# Patient Record
Sex: Female | Born: 1940
Health system: Southern US, Community
[De-identification: ages and names within clinical notes are randomized; demographics above are authoritative.]

## PROBLEM LIST (undated history)

## (undated) DIAGNOSIS — I10 Essential (primary) hypertension: Secondary | ICD-10-CM

## (undated) DIAGNOSIS — C4490 Unspecified malignant neoplasm of skin, unspecified: Secondary | ICD-10-CM

## (undated) DIAGNOSIS — E785 Hyperlipidemia, unspecified: Secondary | ICD-10-CM

## (undated) DIAGNOSIS — I639 Cerebral infarction, unspecified: Secondary | ICD-10-CM

## (undated) DIAGNOSIS — M1990 Unspecified osteoarthritis, unspecified site: Secondary | ICD-10-CM

## (undated) DIAGNOSIS — R3915 Urgency of urination: Secondary | ICD-10-CM

## (undated) DIAGNOSIS — H269 Unspecified cataract: Secondary | ICD-10-CM

## (undated) DIAGNOSIS — R32 Unspecified urinary incontinence: Secondary | ICD-10-CM

## (undated) HISTORY — DX: Unspecified osteoarthritis, unspecified site: M19.90

## (undated) HISTORY — DX: Essential (primary) hypertension: I10

## (undated) HISTORY — PX: TONSILLECTOMY: SUR1361

## (undated) HISTORY — PX: CARPAL TUNNEL RELEASE: SHX101

## (undated) HISTORY — PX: ABDOMINAL HYSTERECTOMY: SHX81

## (undated) HISTORY — DX: Unspecified cataract: H26.9

## (undated) HISTORY — PX: OTHER SURGICAL HISTORY: SHX169

## (undated) HISTORY — PX: TUBAL LIGATION: SHX77

## (undated) HISTORY — PX: TONSILLECTOMY AND ADENOIDECTOMY: SHX28

## (undated) HISTORY — PX: EYE SURGERY: SHX253

## (undated) HISTORY — PX: JOINT REPLACEMENT: SHX530

---
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
## 2010-08-25 LAB — LIPID PANEL: LDl/HDL Ratio: 5.9

## 2010-12-12 ENCOUNTER — Encounter: Payer: Medicare Other | Attending: Family Medicine | Admitting: *Deleted

## 2010-12-12 ENCOUNTER — Encounter: Payer: Self-pay | Admitting: *Deleted

## 2010-12-12 DIAGNOSIS — E119 Type 2 diabetes mellitus without complications: Secondary | ICD-10-CM | POA: Insufficient documentation

## 2010-12-12 DIAGNOSIS — Z713 Dietary counseling and surveillance: Secondary | ICD-10-CM | POA: Insufficient documentation

## 2010-12-12 NOTE — Progress Notes (Signed)
  Medical Nutrition Therapy:  Appt start time: 0800 end time:  0900.   Assessment:  Primary concerns today: Patient was diagnosed with Type 2 Diabetes about 6 years ago and attended Diabetes Classes at that time. She has moved to [place] recently and lost her husband about 2 years ago, so with these life changes her diabetes control has decreased. She expresses good verbal understanding of physiology of diabetes, action of her medications, value of meal planning and exercise and she tests her BG 1-2 times per day both pre and post meal. Her A1c has increased to 9.3% lately and she would like an update on diabetes management tools to bring that down.   MEDICATIONS: see list. Diabetes medication is Glimepiride 1 mg BID    DIETARY INTAKE:  Usual eating pattern includes 3 meals and 0-1 snacks per day.  Everyday foods include good variety of all food groups and good intake of fiber.  Avoided foods include olives, fruit juices and sweets.    24-hr recall:  B ( AM): 1 egg and 1 slice cheese, coffee with 1/2 & 1/2  Snk ( AM): occasionally cottage cheese or an apple  L ( PM): salad or sandwich Snk ( PM): none D ( PM): lean meat with 1 cup vegetables Snk ( PM): none Beverages: coffee, water, occasional diet soda  Usual physical activity: none recently, but plans to resume swimming twice weekly as well as walking with assistance of hiking poles to relieve knee pain, or video exercises at home  Estimated energy needs: 1200 calories 135 g carbohydrates 90 g protein 33 g fat  Progress Towards Goal(s):  Modified goal(s).   Nutritional Diagnosis:  NI-5.8.4 Inconsistent carbohydrate intake As related to BG management.  As evidenced by elevated A1c of 9.3%.    Intervention:  Nutrition counseling and diabetes education provided with review of rationale for consistent carb intake and action of her Glimipiride. She is knowledgeable of symptoms of low BG, causes and treatment.  Goals:  Follow Diabetes  Meal Plan as instructed  Eat 3 meals and 2 snacks, every 3-5 hrs  Limit carbohydrate intake to 30 grams carbohydrate/meal  Limit carbohydrate intake to 15 grams carbohydrate/snack  Add lean protein foods to meals/snacks  Monitor glucose levels as instructed by your doctor  Aim for 30 mins of physical activity daily by swimming twice weekly, walking with hiking poles and video exercises  Bring food record and glucose log to your next MD visit Handouts given during visit include:  Living Well with Type 2 Diabetes  Carb Counting and Beyond and Meal Planning handouts  Monitoring/Evaluation:  Dietary intake, exercise, self monitoring of BG daily, and body weight prn.

## 2010-12-12 NOTE — Patient Instructions (Signed)
Goals:  Follow Diabetes Meal Plan as instructed  Eat 3 meals and 2 snacks, every 3-5 hrs  Limit carbohydrate intake to 30 grams carbohydrate/meal  Limit carbohydrate intake to 15 grams carbohydrate/snack  Add lean protein foods to meals/snacks  Monitor glucose levels as instructed by your doctor  Aim for 30 mins of physical activity daily by swimming twice weekly, walking with hiking poles and video exercises  Bring food record and glucose log to your next MD visit

## 2010-12-27 ENCOUNTER — Encounter: Payer: Self-pay | Admitting: *Deleted

## 2011-03-09 ENCOUNTER — Encounter: Payer: Self-pay | Admitting: *Deleted

## 2011-03-10 ENCOUNTER — Ambulatory Visit (INDEPENDENT_AMBULATORY_CARE_PROVIDER_SITE_OTHER): Payer: Medicare Other | Admitting: Family Medicine

## 2011-03-10 ENCOUNTER — Encounter: Payer: Self-pay | Admitting: Family Medicine

## 2011-03-10 DIAGNOSIS — Z888 Allergy status to other drugs, medicaments and biological substances status: Secondary | ICD-10-CM | POA: Diagnosis not present

## 2011-03-10 DIAGNOSIS — I10 Essential (primary) hypertension: Secondary | ICD-10-CM

## 2011-03-10 DIAGNOSIS — T887XXA Unspecified adverse effect of drug or medicament, initial encounter: Secondary | ICD-10-CM

## 2011-03-10 DIAGNOSIS — E785 Hyperlipidemia, unspecified: Secondary | ICD-10-CM | POA: Diagnosis not present

## 2011-03-10 DIAGNOSIS — Z0000 Encounter for general adult medical examination without abnormal findings: Secondary | ICD-10-CM | POA: Insufficient documentation

## 2011-03-10 DIAGNOSIS — E669 Obesity, unspecified: Secondary | ICD-10-CM | POA: Insufficient documentation

## 2011-03-10 DIAGNOSIS — J301 Allergic rhinitis due to pollen: Secondary | ICD-10-CM

## 2011-03-10 DIAGNOSIS — J029 Acute pharyngitis, unspecified: Secondary | ICD-10-CM

## 2011-03-10 LAB — BASIC METABOLIC PANEL
CO2: 24 mEq/L (ref 19–32)
Chloride: 107 mEq/L (ref 96–112)
Glucose, Bld: 141 mg/dL — ABNORMAL HIGH (ref 70–99)
Potassium: 4.3 mEq/L (ref 3.5–5.3)
Sodium: 140 mEq/L (ref 135–145)

## 2011-03-10 LAB — LIPID PANEL
Cholesterol: 229 mg/dL — ABNORMAL HIGH (ref 0–200)
LDL Cholesterol: 142 mg/dL — ABNORMAL HIGH (ref 0–99)
Total CHOL/HDL Ratio: 5.6 Ratio
VLDL: 46 mg/dL — ABNORMAL HIGH (ref 0–40)

## 2011-03-10 LAB — POCT GLYCOSYLATED HEMOGLOBIN (HGB A1C): Hemoglobin A1C: 7.6

## 2011-03-10 NOTE — Progress Notes (Signed)
  Subjective:    Patient ID: Wanda [name], female    DOB: August 16, 1940, 71 y.o.   MRN: [id number]  HPI  This 71 y.o. Cauc female is here for Diabetes f/u with blood sugars in the low 120s and no  hypoglycemia. She c/o mild cough thought to be related to BP med; pt had cough with Diovan  but it seems to be a little more. She says she can live with this and cough drop stops the problem.  She had 1 visit with the nutritionist and has not needed to return; she is practicing better eating habits  and will increase activity in the coming months. She also checks feet daily and has no complaints.    Review of Systems As per HPI; otherwise; noncontributory    Objective:   Physical Exam  Nursing note and vitals reviewed. Constitutional: She is oriented to person, place, and time. She appears well-developed and well-nourished. No distress.  HENT:  Head: Normocephalic and atraumatic.  Cardiovascular: Normal rate.   Pulmonary/Chest: Effort normal. No respiratory distress.  Neurological: She is alert and oriented to person, place, and time. No cranial nerve deficit.    Results for orders placed in visit on 03/10/11  POCT GLYCOSYLATED HEMOGLOBIN (HGB A1C)      Component Value Range   Hemoglobin A1C 7.6           Assessment & Plan:   1. Type II or unspecified type diabetes mellitus without mention of complication, uncontrolled  Basic metabolic panel, POCT A1C; much improved control; no medication changes  2. Medication side effect  Cough due to ACEI but pt wants to continue current med  3. HTN, goal below 130/80    4. Hyperlipidemia LDL goal < 70  Lipid panel  5.   Immunization  Status                                       Pt declines Flu vaccine and Pneumovax

## 2011-03-10 NOTE — Patient Instructions (Signed)
Hypertriglyceridemia  Diet for High blood levels of Triglycerides Most fats in food are triglycerides. Triglycerides in your blood are stored as fat in your body. High levels of triglycerides in your blood may put you at a greater risk for heart disease and stroke.  Normal triglyceride levels are less than 150 mg/dL. Borderline high levels are 150-199 mg/dl. High levels are 200 - 499 mg/dL, and very high triglyceride levels are greater than 500 mg/dL. The decision to treat high triglycerides is generally based on the level. For people with borderline or high triglyceride levels, treatment includes weight loss and exercise. Drugs are recommended for people with very high triglyceride levels. Many people who need treatment for high triglyceride levels have metabolic syndrome. This syndrome is a collection of disorders that often include: insulin resistance, high blood pressure, blood clotting problems, high cholesterol and triglycerides. TESTING PROCEDURE FOR TRIGLYCERIDES  You should not eat 4 hours before getting your triglycerides measured. The normal range of triglycerides is between 10 and 250 milligrams per deciliter (mg/dl). Some people may have extreme levels (1000 or above), but your triglyceride level may be too high if it is above 150 mg/dl, depending on what other risk factors you have for heart disease.   People with high blood triglycerides may also have high blood cholesterol levels. If you have high blood cholesterol as well as high blood triglycerides, your risk for heart disease is probably greater than if you only had high triglycerides. High blood cholesterol is one of the main risk factors for heart disease.  CHANGING YOUR DIET  Your weight can affect your blood triglyceride level. If you are more than 20% above your ideal body weight, you may be able to lower your blood triglycerides by losing weight. Eating less and exercising regularly is the best way to combat this. Fat provides  more calories than any other food. The best way to lose weight is to eat less fat. Only 30% of your total calories should come from fat. Less than 7% of your diet should come from saturated fat. A diet low in fat and saturated fat is the same as a diet to decrease blood cholesterol. By eating a diet lower in fat, you may lose weight, lower your blood cholesterol, and lower your blood triglyceride level.  Eating a diet low in fat, especially saturated fat, may also help you lower your blood triglyceride level. Ask your dietitian to help you figure how much fat you can eat based on the number of calories your caregiver has prescribed for you.  Exercise, in addition to helping with weight loss may also help lower triglyceride levels.   Alcohol can increase blood triglycerides. You may need to stop drinking alcoholic beverages.   Too much carbohydrate in your diet may also increase your blood triglycerides. Some complex carbohydrates are necessary in your diet. These may include bread, rice, potatoes, other starchy vegetables and cereals.   Reduce "simple" carbohydrates. These may include pure sugars, candy, honey, and jelly without losing other nutrients. If you have the kind of high blood triglycerides that is affected by the amount of carbohydrates in your diet, you will need to eat less sugar and less high-sugar foods. Your caregiver can help you with this.   Adding 2-4 grams of fish oil (EPA+ DHA) may also help lower triglycerides. Speak with your caregiver before adding any supplements to your regimen.  Following the Diet  Maintain your ideal weight. Your caregivers can help you with a diet. Generally,   eating less food and getting more exercise will help you lose weight. Joining a weight control group may also help. Ask your caregivers for a good weight control group in your area.  Eat low-fat foods instead of high-fat foods. This can help you lose weight too.  These foods are lower in fat. Eat MORE  of these:   Dried beans, peas, and lentils.   Egg whites.   Low-fat cottage cheese.   Fish.   Lean cuts of meat, such as round, sirloin, rump, and flank (cut extra fat off meat you fix).   Whole grain breads, cereals and pasta.   Skim and nonfat dry milk.   Low-fat yogurt.   Poultry without the skin.   Cheese made with skim or part-skim milk, such as mozzarella, parmesan, farmers', ricotta, or pot cheese.  These are higher fat foods. Eat LESS of these:   Whole milk and foods made from whole milk, such as American, blue, cheddar, monterey jack, and swiss cheese   High-fat meats, such as luncheon meats, sausages, knockwurst, bratwurst, hot dogs, ribs, corned beef, ground pork, and regular ground beef.   Fried foods.  Limit saturated fats in your diet. Substituting unsaturated fat for saturated fat may decrease your blood triglyceride level. You will need to read package labels to know which products contain saturated fats.  These foods are high in saturated fat. Eat LESS of these:   Fried pork skins.   Whole milk.   Skin and fat from poultry.   Palm oil.   Butter.   Shortening.   Cream cheese.   Bacon.   Margarines and baked goods made from listed oils.   Vegetable shortenings.   Chitterlings.   Fat from meats.   Coconut oil.   Palm kernel oil.   Lard.   Cream.   Sour cream.   Fatback.   Coffee whiteners and non-dairy creamers made with these oils.   Cheese made from whole milk.  Use unsaturated fats (both polyunsaturated and monounsaturated) moderately. Remember, even though unsaturated fats are better than saturated fats; you still want a diet low in total fat.  These foods are high in unsaturated fat:   Canola oil.   Sunflower oil.   Mayonnaise.   Almonds.   Peanuts.   Pine nuts.   Margarines made with these oils.   Safflower oil.   Olive oil.   Avocados.   Cashews.   Peanut butter.   Sunflower seeds.   Soybean oil.     Peanut oil.   Olives.   Pecans.   Walnuts.   Pumpkin seeds.  Avoid sugar and other high-sugar foods. This will decrease carbohydrates without decreasing other nutrients. Sugar in your food goes rapidly to your blood. When there is excess sugar in your blood, your liver may use it to make more triglycerides. Sugar also contains calories without other important nutrients.  Eat LESS of these:   Sugar, brown sugar, powdered sugar, jam, jelly, preserves, honey, syrup, molasses, pies, candy, cakes, cookies, frosting, pastries, colas, soft drinks, punches, fruit drinks, and regular gelatin.   Avoid alcohol. Alcohol, even more than sugar, may increase blood triglycerides. In addition, alcohol is high in calories and low in nutrients. Ask for sparkling water, or a diet soft drink instead of an alcoholic beverage.  Suggestions for planning and preparing meals   Bake, broil, grill or roast meats instead of frying.   Remove fat from meats and skin from poultry before cooking.   Add spices,   herbs, lemon juice or vinegar to vegetables instead of salt, rich sauces or gravies.   Use a non-stick skillet without fat or use no-stick sprays.   Cool and refrigerate stews and broth. Then remove the hardened fat floating on the surface before serving.   Refrigerate meat drippings and skim off fat to make low-fat gravies.   Serve more fish.   Use less butter, margarine and other high-fat spreads on bread or vegetables.   Use skim or reconstituted non-fat dry milk for cooking.   Cook with low-fat cheeses.   Substitute low-fat yogurt or cottage cheese for all or part of the sour cream in recipes for sauces, dips or congealed salads.   Use half yogurt/half mayonnaise in salad recipes.   Substitute evaporated skim milk for cream. Evaporated skim milk or reconstituted non-fat dry milk can be whipped and substituted for whipped cream in certain recipes.   Choose fresh fruits for dessert instead of  high-fat foods such as pies or cakes. Fruits are naturally low in fat.  When Dining Out   Order low-fat appetizers such as fruit or vegetable juice, pasta with vegetables or tomato sauce.   Select clear, rather than cream soups.   Ask that dressings and gravies be served on the side. Then use less of them.   Order foods that are baked, broiled, poached, steamed, stir-fried, or roasted.   Ask for margarine instead of butter, and use only a small amount.   Drink sparkling water, unsweetened tea or coffee, or diet soft drinks instead of alcohol or other sweet beverages.  QUESTIONS AND ANSWERS ABOUT OTHER FATS IN THE BLOOD: SATURATED FAT, TRANS FAT, AND CHOLESTEROL What is trans fat? Trans fat is a type of fat that is formed when vegetable oil is hardened through a process called hydrogenation. This process helps makes foods more solid, gives them shape, and prolongs their shelf life. Trans fats are also called hydrogenated or partially hydrogenated oils.  What do saturated fat, trans fat, and cholesterol in foods have to do with heart disease? Saturated fat, trans fat, and cholesterol in the diet all raise the level of LDL "bad" cholesterol in the blood. The higher the LDL cholesterol, the greater the risk for coronary heart disease (CHD). Saturated fat and trans fat raise LDL similarly.  What foods contain saturated fat, trans fat, and cholesterol? High amounts of saturated fat are found in animal products, such as fatty cuts of meat, chicken skin, and full-fat dairy products like butter, whole milk, cream, and cheese, and in tropical vegetable oils such as palm, palm kernel, and coconut oil. Trans fat is found in some of the same foods as saturated fat, such as vegetable shortening, some margarines (especially hard or stick margarine), crackers, cookies, baked goods, fried foods, salad dressings, and other processed foods made with partially hydrogenated vegetable oils. Small amounts of trans fat  also occur naturally in some animal products, such as milk products, beef, and lamb. Foods high in cholesterol include liver, other organ meats, egg yolks, shrimp, and full-fat dairy products. How can I use the new food label to make heart-healthy food choices? Check the Nutrition Facts panel of the food label. Choose foods lower in saturated fat, trans fat, and cholesterol. For saturated fat and cholesterol, you can also use the Percent Daily Value (%DV): 5% DV or less is low, and 20% DV or more is high. (There is no %DV for trans fat.) Use the Nutrition Facts panel to choose foods low in   saturated fat and cholesterol, and if the trans fat is not listed, read the ingredients and limit products that list shortening or hydrogenated or partially hydrogenated vegetable oil, which tend to be high in trans fat. POINTS TO REMEMBER: YOU NEED A LITTLE TLC (THERAPEUTIC LIFESTYLE CHANGES)  Discuss your risk for heart disease with your caregivers, and take steps to reduce risk factors.   Change your diet. Choose foods that are low in saturated fat, trans fat, and cholesterol.   Add exercise to your daily routine if it is not already being done. Participate in physical activity of moderate intensity, like brisk walking, for at least 30 minutes on most, and preferably all days of the week. No time? Break the 30 minutes into three, 10-minute segments during the day.   Stop smoking. If you do smoke, contact your caregiver to discuss ways in which they can help you quit.   Do not use street drugs.   Maintain a normal weight.   Maintain a healthy blood pressure.   Keep up with your blood work for checking the fats in your blood as directed by your caregiver.  Document Released: 10/14/2003 Document Revised: 09/07/2010 Document Reviewed: 05/11/2008 [employer] Patient Information 2012 [employer]. 

## 2011-03-15 ENCOUNTER — Encounter: Payer: Self-pay | Admitting: *Deleted

## 2011-03-15 NOTE — Progress Notes (Signed)
Quick Note:  Please call pt and advise that the following labs are abnormal... Lipids are still elevated. Need to seriously consider use of alternative medication (not a Statin) to lower cholesterol. Continue healthier diet and better nutrition to improve Diabetes control. If pt is not using Omega 3 Fish Oil, then get this OTC- 1200 mg Take 1 capsule daily and we can recheck Lipids at next visit.   Provide copy of labs to pt. ______

## 2011-03-23 ENCOUNTER — Encounter: Payer: Self-pay | Admitting: Family Medicine

## 2011-03-23 ENCOUNTER — Ambulatory Visit (INDEPENDENT_AMBULATORY_CARE_PROVIDER_SITE_OTHER): Payer: Medicare Other | Admitting: Family Medicine

## 2011-03-23 VITALS — BP 178/79 | HR 80 | Temp 97.9°F | Resp 20 | Ht 61.0 in | Wt 177.6 lb

## 2011-03-23 DIAGNOSIS — I10 Essential (primary) hypertension: Secondary | ICD-10-CM

## 2011-03-23 DIAGNOSIS — T887XXA Unspecified adverse effect of drug or medicament, initial encounter: Secondary | ICD-10-CM | POA: Diagnosis not present

## 2011-03-23 MED ORDER — VALSARTAN 40 MG PO TABS: 40.0000 mg | ORAL_TABLET | Freq: Every day | ORAL | Status: DC

## 2011-03-23 NOTE — Progress Notes (Signed)
  Subjective:    Patient ID: Sary Bogie, female    DOB: 1940/09/13, 71 y.o.   MRN: [id number]  HPI  This 71 y.o. Cauc female has HTN and was started on Lisinopril/HCTZ at previous visit. She had  side effects including cough, diarrhea and swelling of right eye which all resolved after she stopped the medication.  Home BP readings range from 128- 166/ 71-84, heart rate 73-104. She wants to resume Diovan which  she took in the past without side effects; Brand Diovan was too expensive but pt wants to resume  this med if a Generic is available.    Review of Systems As per HPI    Objective:   Physical Exam  Nursing note and vitals reviewed. Constitutional: She is oriented to person, place, and time. She appears well-developed and well-nourished. No distress.  HENT:  Head: Normocephalic and atraumatic.  Eyes: EOM are normal. No scleral icterus.  Cardiovascular: Normal rate and regular rhythm.   Pulmonary/Chest: No respiratory distress.  Musculoskeletal: She exhibits no edema.  Neurological: She is alert and oriented to person, place, and time. No cranial nerve deficit. Coordination normal.  Skin: Skin is warm and dry. No rash noted. No erythema.          Assessment & Plan:   1. HTN (hypertension)     D/C ACEI                                         Rx:  Valsartan 40 mg  1 tablet daily                                         RTC as scheduled in July  2. Medication side effect     Noted in pt record: Allergy /Intolerance to ACEIs

## 2011-06-19 ENCOUNTER — Ambulatory Visit: Payer: Medicare Other

## 2011-06-19 ENCOUNTER — Ambulatory Visit (INDEPENDENT_AMBULATORY_CARE_PROVIDER_SITE_OTHER): Payer: Medicare Other | Admitting: Internal Medicine

## 2011-06-19 VITALS — BP 175/92 | HR 90 | Temp 98.2°F | Resp 18 | Ht 61.0 in | Wt 182.2 lb

## 2011-06-19 DIAGNOSIS — R079 Chest pain, unspecified: Secondary | ICD-10-CM

## 2011-06-19 DIAGNOSIS — R0789 Other chest pain: Secondary | ICD-10-CM | POA: Diagnosis not present

## 2011-06-19 DIAGNOSIS — R05 Cough: Secondary | ICD-10-CM

## 2011-06-19 DIAGNOSIS — J40 Bronchitis, not specified as acute or chronic: Secondary | ICD-10-CM

## 2011-06-19 LAB — POCT CBC
HCT, POC: 42.6 % (ref 37.7–47.9)
Hemoglobin: 14 g/dL (ref 12.2–16.2)
Lymph, poc: 2.6 (ref 0.6–3.4)
MCH, POC: 29.9 pg (ref 27–31.2)
MCHC: 32.9 g/dL (ref 31.8–35.4)
MCV: 90.8 fL (ref 80–97)
WBC: 6.8 10*3/uL (ref 4.6–10.2)

## 2011-06-19 MED ORDER — AZITHROMYCIN 500 MG PO TABS: 500.0000 mg | ORAL_TABLET | Freq: Every day | ORAL | Status: AC

## 2011-06-19 NOTE — Progress Notes (Signed)
  Subjective:    Patient ID: Wanda [name], female    DOB: 1940-08-23, 71 y.o.   MRN: [id number]  HPI  Cough Onset Saturday 3 days ago Moderate in severity assoc heaviness in chest esp with breathing No radiation occurrence is constant for past several days No sweats No palpitations No fever Sputum white    Review of Systems  Constitutional: Positive for fatigue.  HENT: Negative.   Eyes: Negative.   Respiratory: Positive for cough and chest tightness. Negative for choking, shortness of breath and wheezing.   Cardiovascular: Positive for chest pain.  Gastrointestinal: Negative.   Genitourinary: Negative.   Musculoskeletal: Negative.   Skin: Negative.   Neurological: Negative.   Psychiatric/Behavioral: Negative.   All other systems reviewed and are negative.       Objective:   Physical Exam  Nursing note and vitals reviewed. Constitutional: She appears well-developed and well-nourished.  HENT:  Head: Normocephalic and atraumatic.  Right Ear: External ear normal.  Left Ear: External ear normal.  Nose: Nose normal.  Mouth/Throat: Oropharynx is clear and moist.  Eyes: Conjunctivae and EOM are normal. Pupils are equal, round, and reactive to light.  Neck: Normal range of motion. Neck supple.  Cardiovascular: Normal rate, regular rhythm, normal heart sounds and intact distal pulses.   Pulmonary/Chest: Effort normal. No respiratory distress.  Abdominal: Soft. Bowel sounds are normal.  Skin: Skin is warm and dry.  Psychiatric: She has a normal mood and affect. Her behavior is normal. Judgment and thought content normal.    Results for orders placed in visit on 06/19/11  POCT CBC      Component Value Range   WBC 6.8  4.6 - 10.2 (K/uL)   Lymph, poc 2.6  0.6 - 3.4    POC LYMPH PERCENT 38.8  10 - 50 (%L)   MID (cbc) 0.4  0 - 0.9    POC MID % 6.4  0 - 12 (%M)   POC Granulocyte 3.7  2 - 6.9    Granulocyte percent 54.8  37 - 80 (%G)   RBC 4.69  4.04 - 5.48 (M/uL)   Hemoglobin 14.0  12.2 - 16.2 (g/dL)   HCT, POC 21.3  08.6 - 47.9 (%)   MCV 90.8  80 - 97 (fL)   MCH, POC 29.9  27 - 31.2 (pg)   MCHC 32.9  31.8 - 35.4 (g/dL)   RDW, POC 57.8     Platelet Count, POC 286  142 - 424 (K/uL)   MPV 8.4  0 - 99.8 (fL)  normal cbc  ekg nsr hr 75 qrs normal axis normal no st changes normal ekg [facility] reading (PRIMARY) by  Dr. Glenis Smoker negive chest xray.     Assessment & Plan:  Diff dx includes pneumonia, bronchitus, congestive heart failure,angina Normal cbc normal ekg chest xray is negitive for pneumonia. Will rx for bronchitus with zithromax.

## 2011-06-19 NOTE — Patient Instructions (Signed)
zithromax as directed. Return for f/up  In one week or if you develop pain or shortness of breath or fever.

## 2011-07-14 ENCOUNTER — Ambulatory Visit: Payer: Self-pay | Admitting: Family Medicine

## 2011-07-26 DIAGNOSIS — H40059 Ocular hypertension, unspecified eye: Secondary | ICD-10-CM | POA: Diagnosis not present

## 2011-07-26 DIAGNOSIS — E119 Type 2 diabetes mellitus without complications: Secondary | ICD-10-CM | POA: Diagnosis not present

## 2011-07-26 DIAGNOSIS — H2510 Age-related nuclear cataract, unspecified eye: Secondary | ICD-10-CM | POA: Diagnosis not present

## 2011-07-28 ENCOUNTER — Ambulatory Visit (INDEPENDENT_AMBULATORY_CARE_PROVIDER_SITE_OTHER): Payer: Medicare Other | Admitting: Family Medicine

## 2011-07-28 VITALS — BP 156/80 | HR 74 | Temp 96.9°F | Resp 16 | Ht 61.0 in | Wt 183.0 lb

## 2011-07-28 DIAGNOSIS — IMO0001 Reserved for inherently not codable concepts without codable children: Secondary | ICD-10-CM

## 2011-07-28 DIAGNOSIS — I10 Essential (primary) hypertension: Secondary | ICD-10-CM | POA: Diagnosis not present

## 2011-07-28 DIAGNOSIS — E785 Hyperlipidemia, unspecified: Secondary | ICD-10-CM | POA: Diagnosis not present

## 2011-07-28 LAB — LIPID PANEL
HDL: 44 mg/dL (ref >39)
LDL Cholesterol: 173 mg/dL — ABNORMAL HIGH (ref 0–99)
Total CHOL/HDL Ratio: 6 Ratio
VLDL: 48 mg/dL — ABNORMAL HIGH (ref 0–40)

## 2011-07-28 LAB — ALT: ALT: 19 U/L (ref 0–35)

## 2011-07-28 MED ORDER — GLIMEPIRIDE 2 MG PO TABS: 2.0000 mg | ORAL_TABLET | Freq: Every day | ORAL | Status: DC

## 2011-07-28 MED ORDER — GLIMEPIRIDE 2 MG PO TABS: ORAL_TABLET | ORAL | Status: DC

## 2011-07-28 NOTE — Progress Notes (Signed)
  Subjective:    Patient ID: Wanda [name], female    DOB: 01/03/1941, 71 y.o.   MRN: [id number]  HPI  Type II DM: FSBS running high due to lack of exercise (had rib injury on April 1st and not very active   for several weeks).  Has resumed swimming and strength training 2 days a week.  FSBS: 140-210. No hypoglycemia.   HTN: Not on any med now since it was discontinued due to cough. BP reading at home are good-  120-150/70-85. She denies HA, CP, palpitations, dizziness or weakness. No cough.   Review of Systems  Constitutional: Positive for activity change. Negative for diaphoresis, fatigue and unexpected weight change.  Respiratory: Negative for cough, chest tightness and shortness of breath.   Cardiovascular: Negative.   Neurological: Negative for dizziness, syncope, weakness, light-headedness and headaches.  Psychiatric/Behavioral: Negative.        Objective:   Physical Exam  Nursing note and vitals reviewed. Constitutional: She is oriented to person, place, and time. She appears well-developed and well-nourished. No distress.  HENT:  Head: Normocephalic and atraumatic.  Eyes: Conjunctivae and EOM are normal. No scleral icterus.  Cardiovascular: Normal rate and regular rhythm.   Pulmonary/Chest: Effort normal. No respiratory distress.  Musculoskeletal: She exhibits edema.  Neurological: She is alert and oriented to person, place, and time. No cranial nerve deficit.  Skin: Skin is warm and dry.  Psychiatric: She has a normal mood and affect. Her behavior is normal.    A1C=8.1%      Assessment & Plan:   1. Type II or unspecified type diabetes mellitus without mention of complication, uncontrolled  POCT glycosylated hemoglobin (Hb A1C)- increased from 7.6% in March Increase Glimepiride 2mg  to 1 tablet bid Continue checking FSBS daily  2. Hyperlipidemia LDL goal <100  Lipid panel, ALT  3. HTN, goal below 140/80  Continue to monitor BP at home; contact office if  SBP> 150  and/or symptoms occur; pt understands and agrees with plan

## 2011-07-31 ENCOUNTER — Encounter: Payer: Self-pay | Admitting: Family Medicine

## 2011-08-01 ENCOUNTER — Other Ambulatory Visit: Payer: Self-pay | Admitting: Family Medicine

## 2011-08-01 DIAGNOSIS — E785 Hyperlipidemia, unspecified: Secondary | ICD-10-CM

## 2011-08-01 MED ORDER — GEMFIBROZIL 600 MG PO TABS: 600.0000 mg | ORAL_TABLET | Freq: Two times a day (BID) | ORAL | Status: DC

## 2011-08-01 NOTE — Progress Notes (Signed)
Quick Note:  Please call pt and advise that the following labs are abnormal... Your lipids are very high; as we discussed, you will need to take a different type of medication to lower your cholesterol and triglycerides. I am prescribing Gemfibrozil 600 mg 1 tablet twice a day before meals to lower your lipids. I would like for you to have your labs checked again in about 8-10 weeks (before your next office visit).  Contact the clinic if you have any problems with this new medication.  Copy to pt. ______

## 2011-10-27 ENCOUNTER — Ambulatory Visit: Payer: Medicare Other | Admitting: Family Medicine

## 2012-04-02 DIAGNOSIS — Z803 Family history of malignant neoplasm of breast: Secondary | ICD-10-CM | POA: Diagnosis not present

## 2012-04-02 DIAGNOSIS — Z1231 Encounter for screening mammogram for malignant neoplasm of breast: Secondary | ICD-10-CM | POA: Diagnosis not present

## 2012-04-03 ENCOUNTER — Other Ambulatory Visit: Payer: Self-pay | Admitting: Family Medicine

## 2012-04-03 ENCOUNTER — Ambulatory Visit (INDEPENDENT_AMBULATORY_CARE_PROVIDER_SITE_OTHER): Payer: Medicare Other | Admitting: Family Medicine

## 2012-04-03 ENCOUNTER — Encounter: Payer: Self-pay | Admitting: Family Medicine

## 2012-04-03 VITALS — BP 202/86 | HR 85 | Temp 98.0°F | Resp 16 | Ht 61.0 in | Wt 180.0 lb

## 2012-04-03 DIAGNOSIS — E785 Hyperlipidemia, unspecified: Secondary | ICD-10-CM | POA: Diagnosis not present

## 2012-04-03 LAB — COMPREHENSIVE METABOLIC PANEL
ALT: 19 U/L (ref 0–35)
BUN: 15 mg/dL (ref 6–23)
CO2: 19 mEq/L (ref 19–32)
Calcium: 9.7 mg/dL (ref 8.4–10.5)
Chloride: 104 mEq/L (ref 96–112)
Creat: 0.76 mg/dL (ref 0.50–1.10)
Glucose, Bld: 182 mg/dL — ABNORMAL HIGH (ref 70–99)
Total Bilirubin: 0.6 mg/dL (ref 0.3–1.2)

## 2012-04-03 LAB — LIPID PANEL
Cholesterol: 284 mg/dL — ABNORMAL HIGH (ref 0–200)
HDL: 42 mg/dL (ref >39)
Total CHOL/HDL Ratio: 6.8 Ratio
Triglycerides: 225 mg/dL — ABNORMAL HIGH (ref <150)
VLDL: 45 mg/dL — ABNORMAL HIGH (ref 0–40)

## 2012-04-03 MED ORDER — GLIMEPIRIDE 2 MG PO TABS: ORAL_TABLET | ORAL | Status: DC

## 2012-04-03 NOTE — Patient Instructions (Signed)
Glimepiride dose change- continue to take 1 tablet twice a day with main meals and 1/2 tablet at  Midday with meal. Continue to make changes in your nutrition. It is good that you are managing your stress well; this is very important for maintaining good control of Diabetes. See if you can find that book- "The Blood Sugar Solution" by Dr. Iona Hansen.

## 2012-04-03 NOTE — Progress Notes (Signed)
S:  This 72 y.o. Cauc female has Type II DM; she has been under enormous stress lately and this has caused elevated FSBS (fasting= 170). She checks FSBS 1-2 times a week; during the most stressful times, blood sugars were >300. She has several family members with chronic health conditions (brother in Hospice w/ terminal cancer and son has cancer treatment x 6 years). Her daughter who lives in [place] was having back problems due to a pinched nerve in her lower spine. Pt manages stress well (" I am not a worrier"); she says she is coping with this situation and is sleeping fairly well.  Pt has been compliant with Glimepiride and blood sugars are responding to change in diet. She has eliminated wheat products and other grains (has not tried steel-cut oatmeal). No medication side effects or hypoglycemic episodes reported.  Pt has not been taking Gemfibrizol for several weeks; she reports problems remembering to take medication. Pt plans to  Start walking more; she has some walking poles that will help take the pressure and weight off her arthritic knees.   ROS: As per HPI; negative for fatigue, diaphoresis, unexplained weight loss, CP or tightness, palpitations, SOB or cough, edema, GI problems, HA, dizziness, weakness, numbness or syncope.  O:  Filed Vitals:   04/03/12 1126  BP: 202/86                               BP re-check: 150/90  Pulse: 85  Temp: 98 F (36.7 C)  Resp: 16   GEN: In NAD; WN,WD. HENT: [place]/AT; EOMI w/ clear conj/sclerae. Oroph clear and moist. COR: RRR. No edema. Distal pulses 1-2+/=. LUNGS: Normal resp rate and effort. SKIN: W&D. MS: MAEs; no deformities. Good muscle tone. Gait- antalgic. See DM foot exam. NEURO: A&O x 3; CNs intact. Nonfocal.   Results for orders placed in visit on 04/03/12  POCT GLYCOSYLATED HEMOGLOBIN (HGB A1C)      Result Value Range   Hemoglobin A1C 9.9      A/P: Type II or unspecified type diabetes mellitus without mention of complication,  uncontrolled -  Encouraged improved nutrition and exercise.  Medication change: Glimepiride 2 mg  1 tab bid w/ main meals and 1/2 tab at midday w/ meal. Plan: Comprehensive metabolic panel  Other and unspecified hyperlipidemia - pt is not taking Gemfibrozil.   Plan: Lipid panel

## 2012-04-08 ENCOUNTER — Encounter: Payer: Self-pay | Admitting: Family Medicine

## 2012-04-09 ENCOUNTER — Telehealth: Payer: Self-pay | Admitting: Radiology

## 2012-04-09 ENCOUNTER — Encounter: Payer: Self-pay | Admitting: Family Medicine

## 2012-04-09 LAB — T3, FREE: T3, Free: 2.7 pg/mL (ref 2.3–4.2)

## 2012-04-09 NOTE — Telephone Encounter (Signed)
Order given to lab to add TSH and Free T3 to labs.

## 2012-04-09 NOTE — Telephone Encounter (Signed)
Message copied by Caffie Damme on Tue Apr 09, 2012 10:19 AM ------      Message from: Dow Adolph B      Created: Mon Apr 08, 2012  7:14 PM       Please add TSH and Free T# to lab order on this pt whose results from last week have been reviewed.            Thanks. ------

## 2012-05-14 ENCOUNTER — Encounter: Payer: Self-pay | Admitting: Family Medicine

## 2012-06-12 ENCOUNTER — Encounter: Payer: Self-pay | Admitting: Family Medicine

## 2012-06-13 ENCOUNTER — Other Ambulatory Visit: Payer: Self-pay | Admitting: Family Medicine

## 2012-06-14 ENCOUNTER — Encounter: Payer: Self-pay | Admitting: Family Medicine

## 2012-07-18 ENCOUNTER — Ambulatory Visit (INDEPENDENT_AMBULATORY_CARE_PROVIDER_SITE_OTHER): Payer: Medicare Other | Admitting: Family Medicine

## 2012-07-18 ENCOUNTER — Encounter: Payer: Self-pay | Admitting: Family Medicine

## 2012-07-18 VITALS — BP 164/88 | HR 82 | Temp 98.0°F | Resp 16 | Ht 60.5 in | Wt 178.0 lb

## 2012-07-18 DIAGNOSIS — E785 Hyperlipidemia, unspecified: Secondary | ICD-10-CM | POA: Diagnosis not present

## 2012-07-18 DIAGNOSIS — E1169 Type 2 diabetes mellitus with other specified complication: Secondary | ICD-10-CM | POA: Diagnosis not present

## 2012-07-18 DIAGNOSIS — N649 Disorder of breast, unspecified: Secondary | ICD-10-CM

## 2012-07-18 DIAGNOSIS — Z0000 Encounter for general adult medical examination without abnormal findings: Secondary | ICD-10-CM | POA: Diagnosis not present

## 2012-07-18 DIAGNOSIS — Z139 Encounter for screening, unspecified: Secondary | ICD-10-CM | POA: Diagnosis not present

## 2012-07-18 LAB — POCT URINALYSIS DIPSTICK
Bilirubin, UA: NEGATIVE
Glucose, UA: NEGATIVE
Leukocytes, UA: NEGATIVE
Nitrite, UA: NEGATIVE
Urobilinogen, UA: 0.2

## 2012-07-18 LAB — LIPID PANEL
Cholesterol: 257 mg/dL — ABNORMAL HIGH (ref 0–200)
HDL: 36 mg/dL — ABNORMAL LOW (ref >39)
Total CHOL/HDL Ratio: 7.1 Ratio
Triglycerides: 231 mg/dL — ABNORMAL HIGH (ref <150)

## 2012-07-18 LAB — BASIC METABOLIC PANEL
Chloride: 104 mEq/L (ref 96–112)
Potassium: 4.2 mEq/L (ref 3.5–5.3)
Sodium: 137 mEq/L (ref 135–145)

## 2012-07-18 LAB — POCT GLYCOSYLATED HEMOGLOBIN (HGB A1C): Hemoglobin A1C: 9

## 2012-07-18 MED ORDER — GLIMEPIRIDE 2 MG PO TABS: ORAL_TABLET | ORAL | Status: DC

## 2012-07-18 NOTE — Patient Instructions (Signed)

## 2012-07-18 NOTE — Progress Notes (Deleted)
  Subjective:    Patient ID: Wanda [name], female    DOB: 30-Mar-1940, 72 y.o.   MRN: [id number]  HPI    Review of Systems     Objective:   Physical Exam   Results for orders placed in visit on 07/18/12  POCT URINALYSIS DIPSTICK      Result Value Range   Color, UA yellow     Clarity, UA clear     Glucose, UA neg     Bilirubin, UA neg     Ketones, UA neg     Spec Grav, UA <=1.005     Blood, UA trace     pH, UA 5.5     Protein, UA neg     Urobilinogen, UA 0.2     Nitrite, UA neg     Leukocytes, UA Negative    POCT GLYCOSYLATED HEMOGLOBIN (HGB A1C)      Result Value Range   Hemoglobin A1C 9.0    IFOBT (OCCULT BLOOD)      Result Value Range   IFOBT Negative          Assessment & Plan:

## 2012-07-18 NOTE — Progress Notes (Signed)
Subjective:    Patient ID: Wanda [name], female    DOB: [birth year], 72 y.o.   MRN: [id number]  HPI  This 72 y.o. Cauc female is here for [facility] Subsequent CPE. She has HTN and Type II DM  with a significant dyslipidemia (pt intolerant of statins and Metformin). Home BP: SBP= 125-145  and DBP = 70s. FSBS= 150-250; pt denies hypoglycemia. She tried following recommendations  in a book by Dr. Iona Hansen and had increased hyperglycemia. She is now following a modified  [employer] and feels better.    Patient Active Problem List   Diagnosis Date Noted  . Type II or unspecified type diabetes mellitus without mention of complication, uncontrolled 03/10/2011  . HTN, goal below 130/80 03/10/2011  . Hyperlipidemia LDL goal < 70 03/10/2011  . Obesity (BMI 30.0-34.9) 03/10/2011  . Health care maintenance 03/10/2011    PMHx, Soc Hx and Fam Hx reviewed.    Review of Systems  Constitutional: Negative.   HENT: Negative.   Eyes: Negative.   Respiratory: Negative.   Cardiovascular: Negative.   Gastrointestinal: Negative.   Endocrine: Negative.   Genitourinary: Negative.   Musculoskeletal: Negative.   Skin: Negative.   Allergic/Immunologic: Negative.   Neurological: Negative.   Hematological: Negative.   Psychiatric/Behavioral: Negative.        HANDS Questionnaire Depression screening score= 0.       Objective:   Physical Exam  Nursing note and vitals reviewed. Constitutional: She is oriented to person, place, and time. She appears well-developed and well-nourished. No distress.  HENT:  Head: Normocephalic and atraumatic.  Right Ear: Hearing, external ear and ear canal normal. Tympanic membrane is scarred.  Left Ear: Hearing, external ear and ear canal normal. Tympanic membrane is scarred.  Nose: Nose normal. No nasal deformity or septal deviation.  Mouth/Throat: Uvula is midline, oropharynx is clear and moist and mucous membranes are normal. No oral lesions. Normal dentition.   Eyes: Conjunctivae, EOM and lids are normal. Pupils are equal, round, and reactive to light. No scleral icterus.  Fundoscopic exam:      The right eye shows no papilledema. The right eye shows red reflex.       The left eye shows no papilledema. The left eye shows red reflex.  Periodic exam performed by vision care specialist.  Neck: Normal range of motion. Neck supple. No JVD present. No thyromegaly present.  Cardiovascular: Normal rate, regular rhythm, normal heart sounds and intact distal pulses.  Exam reveals no gallop and no friction rub.   No murmur heard. Pulmonary/Chest: Effort normal and breath sounds normal. No respiratory distress. She has no wheezes. She has no rales. Right breast exhibits no inverted nipple, no mass, no nipple discharge, no skin change and no tenderness. Left breast exhibits no inverted nipple, no mass, no nipple discharge, no skin change and no tenderness. Breasts are symmetrical.  Abdominal: Soft. Normal appearance and bowel sounds are normal. She exhibits no distension, no pulsatile midline mass and no mass. There is no hepatosplenomegaly. There is no tenderness. There is no guarding and no CVA tenderness. No hernia.  Genitourinary: Rectal exam shows external hemorrhoid. Rectal exam shows no fissure, no mass, no tenderness and anal tone normal. Guaiac negative stool.  NEFG; no pelvic performed.  Musculoskeletal: Normal range of motion. She exhibits no edema and no tenderness.  Mild degenerative changes in UE digits.   Lymphadenopathy:    She has no cervical adenopathy.  Neurological: She is alert and oriented to person,  place, and time. She has normal strength and normal reflexes. She displays no atrophy. No cranial nerve deficit or sensory deficit. She exhibits normal muscle tone. Coordination and gait normal.  Skin: Skin is warm and dry. No erythema. No pallor.  L breast- 1 cm superficial hyperpigmented lesion with black and brown scaly variations. No bleeding.   Psychiatric: She has a normal mood and affect. Her behavior is normal. Judgment and thought content normal.         Assessment & Plan:  Routine general medical examination at a health care facility - Pt declines referral for CRS.  Plan: POCT urinalysis dipstick, IFOBT POC (occult bld, rslt in office)  Type II or unspecified type diabetes mellitus without mention of complication, uncontrolled - Medication change- Glimiperide 2 mg tablets  Take 1 tablet 3 times daily w/ meals.    Plan: Basic metabolic panel, POCT glycosylated hemoglobin (Hb A1C)  Dyslipidemia associated with type 2 diabetes mellitus - Plan: Lipid panel  Skin lesion of breast- Probable Seb.K. Pt prefers to monitor and will seek DERM eval if lesion changes  Meds ordered this encounter  Medications  . glimepiride (AMARYL) 2 MG tablet    Sig: Take 1 tablet three times a day before main meals.    Dispense:  90 tablet    Refill:  5

## 2012-07-19 ENCOUNTER — Encounter: Payer: Self-pay | Admitting: Family Medicine

## 2012-07-20 ENCOUNTER — Encounter: Payer: Self-pay | Admitting: Family Medicine

## 2012-07-23 ENCOUNTER — Other Ambulatory Visit: Payer: Self-pay | Admitting: Family Medicine

## 2012-07-23 MED ORDER — FENOFIBRATE 145 MG PO TABS: 145.0000 mg | ORAL_TABLET | Freq: Every day | ORAL | Status: DC

## 2012-08-12 DIAGNOSIS — H40059 Ocular hypertension, unspecified eye: Secondary | ICD-10-CM | POA: Diagnosis not present

## 2012-08-12 DIAGNOSIS — E119 Type 2 diabetes mellitus without complications: Secondary | ICD-10-CM | POA: Diagnosis not present

## 2012-08-12 DIAGNOSIS — H2510 Age-related nuclear cataract, unspecified eye: Secondary | ICD-10-CM | POA: Diagnosis not present

## 2012-08-14 ENCOUNTER — Encounter: Payer: Self-pay | Admitting: Family Medicine

## 2012-08-14 ENCOUNTER — Other Ambulatory Visit: Payer: Self-pay

## 2012-08-14 DIAGNOSIS — H2510 Age-related nuclear cataract, unspecified eye: Secondary | ICD-10-CM | POA: Insufficient documentation

## 2012-09-06 ENCOUNTER — Ambulatory Visit: Payer: Medicare Other | Admitting: Family Medicine

## 2012-10-25 ENCOUNTER — Encounter: Payer: Self-pay | Admitting: Family Medicine

## 2012-10-25 ENCOUNTER — Ambulatory Visit (INDEPENDENT_AMBULATORY_CARE_PROVIDER_SITE_OTHER): Payer: Medicare Other | Admitting: Family Medicine

## 2012-10-25 VITALS — BP 160/80 | HR 68 | Temp 97.8°F | Resp 16 | Ht 61.0 in | Wt 178.0 lb

## 2012-10-25 DIAGNOSIS — L989 Disorder of the skin and subcutaneous tissue, unspecified: Secondary | ICD-10-CM | POA: Diagnosis not present

## 2012-10-25 DIAGNOSIS — E785 Hyperlipidemia, unspecified: Secondary | ICD-10-CM | POA: Diagnosis not present

## 2012-10-25 LAB — POCT GLYCOSYLATED HEMOGLOBIN (HGB A1C): Hemoglobin A1C: 8.3

## 2012-10-25 NOTE — Patient Instructions (Signed)
Increase your Fish Oil to 3 grams a day; I want you to have fasting labs drawn in 3 months then I will see you in 4 months. Your Diabetes control is better. Keep up the good work!

## 2012-10-25 NOTE — Progress Notes (Signed)
S:  This 72 y.o. Cauc female is here for Type II DM follow-up; she also has severe dyslipidemia and medication intolerance of statins. She tried Tricor, prescribed in July 2014, for 3 weeks. This medication caused lower extremity muscle aches; pt stopped the med after 3 weeks. She reports better FSBS w/ readings between 100-150. She exercises 2-3 times a week at [employer] and has worked w/ a Psychologist, educational. Pt continues to takes Fish Oil 2 g daily and is compliant w/ all other medications.   Pt has some skin lesions that have changed; one lesion is on her L breast and the other is on L hand. The hand lesion started as a blister but now is crusted w/ a red base. Pt reports many years of sun exposure w/o sunscreen while living in [place].  Patient Active Problem List   Diagnosis Date Noted  . Senile nuclear sclerosis 08/14/2012  . Type II or unspecified type diabetes mellitus without mention of complication, uncontrolled 03/10/2011  . HTN, goal below 130/80 03/10/2011  . Hyperlipidemia LDL goal < 70 03/10/2011  . Obesity (BMI 30.0-34.9) 03/10/2011  . Health care maintenance 03/10/2011    PMHx, Surg Hx, Soc and Fam Hx reviewed. Medications reconciled.  ROS: As per HPI. Negative for hypoglycemic symptoms, diaphoresis, abnormal weight change, vision disturbances, CP or tightness, palpitations, SOB or DOE, abd pain or other GI problems, HA, dizziness, numbness or weakness.  O: Filed Vitals:   10/25/12 0751  BP: 160/80  Pulse: 68  Temp: 97.8 F (36.6 C)  Resp: 16   GEN: In NAD: WN,WD. HENT: [place]/AT; EOMI w/ clear conj/sclerae. EACs/nose/oroph unremarkable. COR: RRR. No edema. LUNGS: Normal resp rate and effort. SKIN: W&D; intact. L hand- dorsum w/ 5 mm raised scaling lesion w/ red base. Other hyperpigmented lesions on sun-exposed areas. MS: MAEs; no joint effusions or deformities. No muscle tenderness. NEURO: A&O x 3; CNs intact. Nonfocal.   Results for orders placed in visit on  10/25/12  POCT GLYCOSYLATED HEMOGLOBIN (HGB A1C)      Result Value Range   Hemoglobin A1C 8.3      A/P: Type II or unspecified type diabetes mellitus without mention of complication, uncontrolled - Much improved A1c; continue current self-management and lifestyle changes.    Plan: POCT glycosylated hemoglobin (Hb A1C), Comprehensive metabolic panel  Hyperlipidemia LDL goal < 70 - Current CVD risk= 2 times average based on TChol/HDL ratio= 7.1; pt aware. Discontinue Tricor; pt declines to use any prescription medications. Increase Fish Oil to 3 grams daily.  Continue [facility] Diet/ modifications.          Plan: Future Lipid panel, Comprehensive metabolic panel in 3 months.  Skin lesion - Plan: Ambulatory referral to Dermatology

## 2012-11-14 ENCOUNTER — Other Ambulatory Visit: Payer: Self-pay

## 2012-11-27 DIAGNOSIS — D235 Other benign neoplasm of skin of trunk: Secondary | ICD-10-CM | POA: Diagnosis not present

## 2012-11-27 DIAGNOSIS — D1801 Hemangioma of skin and subcutaneous tissue: Secondary | ICD-10-CM | POA: Diagnosis not present

## 2012-11-27 DIAGNOSIS — L570 Actinic keratosis: Secondary | ICD-10-CM | POA: Diagnosis not present

## 2012-11-27 DIAGNOSIS — D485 Neoplasm of uncertain behavior of skin: Secondary | ICD-10-CM | POA: Diagnosis not present

## 2012-12-24 ENCOUNTER — Telehealth: Payer: Self-pay

## 2012-12-24 NOTE — Telephone Encounter (Signed)
Fax  From [facility] for diab supply med necessity for Medicare form. Completed signed and faxed.

## 2013-02-06 ENCOUNTER — Other Ambulatory Visit (INDEPENDENT_AMBULATORY_CARE_PROVIDER_SITE_OTHER): Payer: Medicare Other | Admitting: Family Medicine

## 2013-02-06 DIAGNOSIS — IMO0001 Reserved for inherently not codable concepts without codable children: Secondary | ICD-10-CM

## 2013-02-06 DIAGNOSIS — E785 Hyperlipidemia, unspecified: Secondary | ICD-10-CM | POA: Diagnosis not present

## 2013-02-06 DIAGNOSIS — E1165 Type 2 diabetes mellitus with hyperglycemia: Secondary | ICD-10-CM

## 2013-02-06 LAB — LIPID PANEL
CHOL/HDL RATIO: 6.3 ratio
Cholesterol: 303 mg/dL — ABNORMAL HIGH (ref 0–200)
HDL: 48 mg/dL (ref >39)
LDL CALC: 203 mg/dL — AB (ref 0–99)
Triglycerides: 259 mg/dL — ABNORMAL HIGH (ref <150)
VLDL: 52 mg/dL — ABNORMAL HIGH (ref 0–40)

## 2013-02-06 LAB — COMPREHENSIVE METABOLIC PANEL
ALK PHOS: 45 U/L (ref 39–117)
ALT: 20 U/L (ref 0–35)
AST: 22 U/L (ref 0–37)
Albumin: 3.7 g/dL (ref 3.5–5.2)
BILIRUBIN TOTAL: 0.5 mg/dL (ref 0.2–1.2)
BUN: 18 mg/dL (ref 6–23)
CO2: 24 meq/L (ref 19–32)
Calcium: 9.1 mg/dL (ref 8.4–10.5)
Chloride: 107 mEq/L (ref 96–112)
Creat: 0.83 mg/dL (ref 0.50–1.10)
Glucose, Bld: 201 mg/dL — ABNORMAL HIGH (ref 70–99)
Potassium: 4.1 mEq/L (ref 3.5–5.3)
SODIUM: 138 meq/L (ref 135–145)
TOTAL PROTEIN: 6.5 g/dL (ref 6.0–8.3)

## 2013-02-06 NOTE — Progress Notes (Signed)
Patient here today for labs only. °

## 2013-02-07 ENCOUNTER — Encounter: Payer: Self-pay | Admitting: Family Medicine

## 2013-02-28 ENCOUNTER — Ambulatory Visit (INDEPENDENT_AMBULATORY_CARE_PROVIDER_SITE_OTHER): Payer: Medicare Other | Admitting: Family Medicine

## 2013-02-28 ENCOUNTER — Encounter: Payer: Self-pay | Admitting: Family Medicine

## 2013-02-28 VITALS — BP 188/74 | HR 74 | Temp 98.1°F | Resp 16 | Ht 61.5 in | Wt 178.0 lb

## 2013-02-28 DIAGNOSIS — I10 Essential (primary) hypertension: Secondary | ICD-10-CM

## 2013-02-28 DIAGNOSIS — IMO0001 Reserved for inherently not codable concepts without codable children: Secondary | ICD-10-CM

## 2013-02-28 DIAGNOSIS — E1165 Type 2 diabetes mellitus with hyperglycemia: Principal | ICD-10-CM

## 2013-02-28 DIAGNOSIS — E785 Hyperlipidemia, unspecified: Secondary | ICD-10-CM

## 2013-02-28 LAB — T4, FREE: Free T4: 1.02 ng/dL (ref 0.80–1.80)

## 2013-02-28 LAB — HEMOGLOBIN A1C
Hgb A1c MFr Bld: 8.6 % — ABNORMAL HIGH (ref <5.7)
Mean Plasma Glucose: 200 mg/dL — ABNORMAL HIGH (ref <117)

## 2013-02-28 LAB — TSH: TSH: 1.34 u[IU]/mL (ref 0.350–4.500)

## 2013-02-28 LAB — T3, FREE: T3, Free: 3.2 pg/mL (ref 2.3–4.2)

## 2013-02-28 NOTE — Progress Notes (Signed)
S:  This 73 y.o. 21 female returns for DM and HTN follow-up and to discuss recent lipid panel results. Medication- pt takes Glimepiride 2 mg only w/ 2 meals daily; she does not take a midday dose due to food intake being a small snack. BP readings at home= 140-150/70-90. Lifestyle modifications are pt's preferred method of treatment.  Pt has Familial Hyperlipidemia and is intolerant of statins. She exercises most days of the week and does aquatic workout for 30 minutes once a week; she feels goods afterwards but is sore all over. DJD of knees limits walking activities but she does ride the stationary bike. Pt occasionally uses Metamucil. Nutritional modifications have not provided desired results.    Patient Active Problem List   Diagnosis Date Noted  . Senile nuclear sclerosis 08/14/2012  . Type II or unspecified type diabetes mellitus without mention of complication, uncontrolled 03/10/2011  . HTN, goal below 130/80 03/10/2011  . Hyperlipidemia LDL goal < 70 03/10/2011  . Obesity (BMI 30.0-34.9) 03/10/2011  . Health care maintenance 03/10/2011   Prior to Admission medications   Medication Sig Start Date End Date Taking? Authorizing Provider  calcium carbonate (TUMS - DOSED IN MG ELEMENTAL CALCIUM) 500 MG chewable tablet Chew 1 tablet by mouth every other day.     Yes Historical Provider, MD  cholecalciferol (VITAMIN D) 1000 UNITS tablet Take 1,000 Units by mouth daily.     Yes Historical Provider, MD  fish oil-omega-3 fatty acids 1000 MG capsule Take 2 g by mouth daily.   Yes Historical Provider, MD  glimepiride (AMARYL) 2 MG tablet Take 1 tablet three times a day before main meals. 07/18/12  Yes Barton Fanny, MD  vitamin E 200 UNIT capsule Take 200 Units by mouth daily.     Yes Historical Provider, MD   PMHx, Surg Hx, Soc and Fam Hx reviewed.  ROS: As per HPI. Negative for diaphoresis, abnormal weight loss, anorexia, CP or tightness, palpitations, edema, SOB or DOE, cough, abd or  back pain, skin discoloration or rash, HA, dizziness, unilateral numbness or weakness or syncope.  O: Filed Vitals:   02/28/13 0758  BP: 188/74  Pulse: 74  Temp: 98.1 F (36.7 C)  Resp: 16   GEN: In NAD; WN,WD. HENT: /AT; EOMI w/ clear conj/sclerae. Otherwise umremarkable. COR: RRR. No edema. LUNGS: Normal resp rate and effort. SKIN: W&D; intact w/o diaphoresis, erythema, jaundice or pallor. MS: MAEs; no deformities or muscle atrophy. NEURO: A&O x 3; CNs intact. Gait is normal. Nonfocal.  A/P: Type II or unspecified type diabetes mellitus without mention of complication, uncontrolled - Encouraged pt's effort to increase weekly exercise time and ongoing diet modifications. Plan: HM Diabetes Foot Exam, Hemoglobin A1c, CANCELED: POCT glycosylated hemoglobin (Hb A1C)  HTN (hypertension) - Continue to monitor; weight reduction would help lower BP; pt declines medication.      Plan: T4, free, T3, free, TSH  Hyperlipidemia LDL goal < 70- Medication intolerance and pt preference limits treatment choice to lifestyle modifications. Advised CoQ 10 supplement and use of a more potent Omega-3 Fish Oil supplement.

## 2013-02-28 NOTE — Patient Instructions (Signed)
There is an over-the-counter Fish Oil supplement- MED OMEGA FISH OIL 2800 Balanced Concentrate Professional Strength Dietary Supplement Liquid.  Start with 1 teaspoon daily.  You may also want to take a Coenzyme Q10 supplement which helps improve liver health.  Also, Metamucil fiber supplement helps to reduce cholesterol. You could use the powder or try the new fiber bar supplement.  Continue all current medications and I will contact you with the results of today's labs.

## 2013-03-05 NOTE — Telephone Encounter (Signed)
Sent copy in mail

## 2013-03-12 ENCOUNTER — Ambulatory Visit (INDEPENDENT_AMBULATORY_CARE_PROVIDER_SITE_OTHER): Payer: Medicare Other | Admitting: Family Medicine

## 2013-03-12 VITALS — BP 138/86 | HR 95 | Temp 98.5°F | Resp 17 | Ht 61.0 in | Wt 179.0 lb

## 2013-03-12 DIAGNOSIS — B029 Zoster without complications: Secondary | ICD-10-CM

## 2013-03-12 MED ORDER — VALACYCLOVIR HCL 1 G PO TABS: 1000.0000 mg | ORAL_TABLET | Freq: Three times a day (TID) | ORAL | Status: DC

## 2013-03-12 MED ORDER — HYDROCODONE-ACETAMINOPHEN 5-325 MG PO TABS: 0.5000 | ORAL_TABLET | Freq: Four times a day (QID) | ORAL | Status: DC | PRN

## 2013-03-12 NOTE — Patient Instructions (Signed)
Start the Valtrex for the shingles, hydrocodone if needed. Return to the clinic or go to the nearest emergency room if any of your symptoms worsen or new symptoms occur. Shingles Shingles (herpes zoster) is an infection that is caused by the same virus that causes chickenpox (varicella). The infection causes a painful skin rash and fluid-filled blisters, which eventually break open, crust over, and heal. It may occur in any area of the body, but it usually affects only one side of the body or face. The pain of shingles usually lasts about 1 month. However, some people with shingles may develop long-term (chronic) pain in the affected area of the body. Shingles often occurs many years after the person had chickenpox. It is more common:  In people older than 50 years.  In people with weakened immune systems, such as those with HIV, AIDS, or cancer.  In people taking medicines that weaken the immune system, such as transplant medicines.  In people under great stress. CAUSES  Shingles is caused by the varicella zoster virus (VZV), which also causes chickenpox. After a person is infected with the virus, it can remain in the person's body for years in an inactive state (dormant). To cause shingles, the virus reactivates and breaks out as an infection in a nerve root. The virus can be spread from person to person (contagious) through contact with open blisters of the shingles rash. It will only spread to people who have not had chickenpox. When these people are exposed to the virus, they may develop chickenpox. They will not develop shingles. Once the blisters scab over, the person is no longer contagious and cannot spread the virus to others. SYMPTOMS  Shingles shows up in stages. The initial symptoms may be pain, itching, and tingling in an area of the skin. This pain is usually described as burning, stabbing, or throbbing.In a few days or weeks, a painful red rash will appear in the area where the pain,  itching, and tingling were felt. The rash is usually on one side of the body in a band or belt-like pattern. Then, the rash usually turns into fluid-filled blisters. They will scab over and dry up in approximately 2 3 weeks. Flu-like symptoms may also occur with the initial symptoms, the rash, or the blisters. These may include:  Fever.  Chills.  Headache.  Upset stomach. DIAGNOSIS  Your caregiver will perform a skin exam to diagnose shingles. Skin scrapings or fluid samples may also be taken from the blisters. This sample will be examined under a microscope or sent to a lab for further testing. TREATMENT  There is no specific cure for shingles. Your caregiver will likely prescribe medicines to help you manage the pain, recover faster, and avoid long-term problems. This may include antiviral drugs, anti-inflammatory drugs, and pain medicines. HOME CARE INSTRUCTIONS   Take a cool bath or apply cool compresses to the area of the rash or blisters as directed. This may help with the pain and itching.   Only take over-the-counter or prescription medicines as directed by your caregiver.   Rest as directed by your caregiver.  Keep your rash and blisters clean with mild soap and cool water or as directed by your caregiver.  Do not pick your blisters or scratch your rash. Apply an anti-itch cream or numbing creams to the affected area as directed by your caregiver.  Keep your shingles rash covered with a loose bandage (dressing).  Avoid skin contact with:  Babies.   Pregnant women.  Children with eczema.   Elderly people with transplants.   People with chronic illnesses, such as leukemia or AIDS.   Wear loose-fitting clothing to help ease the pain of material rubbing against the rash.  Keep all follow-up appointments with your caregiver.If the area involved is on your face, you may receive a referral for follow-up to a specialist, such as an eye doctor (ophthalmologist) or  an ear, nose, and throat (ENT) doctor. Keeping all follow-up appointments will help you avoid eye complications, chronic pain, or disability.  SEEK IMMEDIATE MEDICAL CARE IF:   You have facial pain, pain around the eye area, or loss of feeling on one side of your face.  You have ear pain or ringing in your ear.  You have loss of taste.  Your pain is not relieved with prescribed medicines.   Your redness or swelling spreads.   You have more pain and swelling.  Your condition is worsening or has changed.   You have a feveror persistent symptoms for more than 2 3 days.  You have a fever and your symptoms suddenly get worse. MAKE SURE YOU:  Understand these instructions.  Will watch your condition.  Will get help right away if you are not doing well or get worse. Document Released: 12/26/2004 Document Revised: 09/20/2011 Document Reviewed: 08/10/2011 [facility] Patient Information 2014 [place].

## 2013-03-12 NOTE — Progress Notes (Signed)
Subjective:    Patient ID: Wanda [name], female    DOB: 08-14-1940, 72 y.o.   MRN: [id number]  HPI PCP: Ellsworth Lennox, MD   Wanda [name] is a 73 y.o. female  Rash on R flank - started 2 days ago in the afternoon - bra was irritating that area. Noticed rash right away.  Has not had shingles vaccine. No fevers, feels  Well o/w. Just slightly sore.   Tx: A and D ointment - helped small amt of discomfort. No po pain meds needed.   No close contacts with immunodeficiency.    No hx of sz d/o.    Patient Active Problem List   Diagnosis Date Noted  . Senile nuclear sclerosis 08/14/2012  . Type II or unspecified type diabetes mellitus without mention of complication, uncontrolled 03/10/2011  . HTN, goal below 130/80 03/10/2011  . Hyperlipidemia LDL goal < 70 03/10/2011  . Obesity (BMI 30.0-34.9) 03/10/2011  . Health care maintenance 03/10/2011   Past Medical History  Diagnosis Date  . Hypertension   . Diabetes mellitus   . Arthritis   . Cataract    Past Surgical History  Procedure Laterality Date  . Abdominal hysterectomy  early 80's    total  . Carpal tunnel release  1980 and 1981    both hands  . Tonsillectomy and adenoidectomy  age 33  . Tubal ligation     Allergies  Allergen Reactions  . Ace Inhibitors Swelling and Cough    Pt had cough and diarrhea with first few doses of medication; also had swelling of right eyelid. Stopped medication on 03/17/11.  . Alendronate Sodium Other (See Comments)    Whole body hurt  . Aspirin Hives  . Lipitor [Atorvastatin Calcium] Other (See Comments)    Whole body hurt  . Metformin And Related     Severe abdominal pain   Prior to Admission medications   Medication Sig Start Date End Date Taking? Authorizing Provider  calcium carbonate (TUMS - DOSED IN MG ELEMENTAL CALCIUM) 500 MG chewable tablet Chew 1 tablet by mouth every other day.     Yes Historical Provider, MD  cholecalciferol (VITAMIN D) 1000 UNITS tablet Take 1,000  Units by mouth daily.     Yes Historical Provider, MD  fish oil-omega-3 fatty acids 1000 MG capsule Take 2 g by mouth daily.   Yes Historical Provider, MD  glimepiride (AMARYL) 2 MG tablet Take 1 tablet three times a day before main meals. 07/18/12  Yes Barton Fanny, MD  vitamin E 200 UNIT capsule Take 200 Units by mouth daily.     Yes Historical Provider, MD   History   Social History  . Marital Status: Unknown    Spouse Name: N/A    Number of Children: N/A  . Years of Education: N/A   Occupational History  . Not on file.   Social History Main Topics  . Smoking status: Never Smoker   . Smokeless tobacco: Never Used  . Alcohol Use: 0.5 oz/week    1 drink(s) per week  . Drug Use: No  . Sexual Activity: Not on file   Other Topics Concern  . Not on file   Social History Narrative   Widowed. Education: [employer]. Exercise: 2 times a week for 30 minutes.       Review of Systems  Constitutional: Negative for fever.  Skin: Positive for rash.       Objective:   Physical Exam  Vitals reviewed. Constitutional: She is oriented  to person, place, and time. She appears well-developed and well-nourished. No distress.  Pulmonary/Chest: Effort normal.  Neurological: She is alert and oriented to person, place, and time.  Skin: Skin is warm and dry. Rash noted.     Psychiatric: She has a normal mood and affect. Her behavior is normal.   Filed Vitals:   03/12/13 1149  BP: 138/86  Pulse: 95  Temp: 98.5 F (36.9 C)  Resp: 17  .       Assessment & Plan:   Wanda [name] is a 73 y.o. female Herpes zoster - Plan: HYDROcodone-acetaminophen (NORCO/VICODIN) 5-325 MG per tablet, valACYclovir (VALTREX) 1000 MG tablet  R flank shingles/zoster. Mild discomfort. Start valtrex as below, lortab if needed SED, . rtc precautions given.   Meds ordered this encounter  Medications  . HYDROcodone-acetaminophen (NORCO/VICODIN) 5-325 MG per tablet    Sig: Take 0.5-1 tablets by mouth  every 6 (six) hours as needed for moderate pain.    Dispense:  15 tablet    Refill:  0  . valACYclovir (VALTREX) 1000 MG tablet    Sig: Take 1 tablet (1,000 mg total) by mouth 3 (three) times daily.    Dispense:  21 tablet    Refill:  0   Patient Instructions  Start the Valtrex for the shingles, hydrocodone if needed. Return to the clinic or go to the nearest emergency room if any of your symptoms worsen or new symptoms occur. Shingles Shingles (herpes zoster) is an infection that is caused by the same virus that causes chickenpox (varicella). The infection causes a painful skin rash and fluid-filled blisters, which eventually break open, crust over, and heal. It may occur in any area of the body, but it usually affects only one side of the body or face. The pain of shingles usually lasts about 1 month. However, some people with shingles may develop long-term (chronic) pain in the affected area of the body. Shingles often occurs many years after the person had chickenpox. It is more common:  In people older than 50 years.  In people with weakened immune systems, such as those with HIV, AIDS, or cancer.  In people taking medicines that weaken the immune system, such as transplant medicines.  In people under great stress. CAUSES  Shingles is caused by the varicella zoster virus (VZV), which also causes chickenpox. After a person is infected with the virus, it can remain in the person's body for years in an inactive state (dormant). To cause shingles, the virus reactivates and breaks out as an infection in a nerve root. The virus can be spread from person to person (contagious) through contact with open blisters of the shingles rash. It will only spread to people who have not had chickenpox. When these people are exposed to the virus, they may develop chickenpox. They will not develop shingles. Once the blisters scab over, the person is no longer contagious and cannot spread the virus to  others. SYMPTOMS  Shingles shows up in stages. The initial symptoms may be pain, itching, and tingling in an area of the skin. This pain is usually described as burning, stabbing, or throbbing.In a few days or weeks, a painful red rash will appear in the area where the pain, itching, and tingling were felt. The rash is usually on one side of the body in a band or belt-like pattern. Then, the rash usually turns into fluid-filled blisters. They will scab over and dry up in approximately 2 3 weeks. Flu-like symptoms may also  occur with the initial symptoms, the rash, or the blisters. These may include:  Fever.  Chills.  Headache.  Upset stomach. DIAGNOSIS  Your caregiver will perform a skin exam to diagnose shingles. Skin scrapings or fluid samples may also be taken from the blisters. This sample will be examined under a microscope or sent to a lab for further testing. TREATMENT  There is no specific cure for shingles. Your caregiver will likely prescribe medicines to help you manage the pain, recover faster, and avoid long-term problems. This may include antiviral drugs, anti-inflammatory drugs, and pain medicines. HOME CARE INSTRUCTIONS   Take a cool bath or apply cool compresses to the area of the rash or blisters as directed. This may help with the pain and itching.   Only take over-the-counter or prescription medicines as directed by your caregiver.   Rest as directed by your caregiver.  Keep your rash and blisters clean with mild soap and cool water or as directed by your caregiver.  Do not pick your blisters or scratch your rash. Apply an anti-itch cream or numbing creams to the affected area as directed by your caregiver.  Keep your shingles rash covered with a loose bandage (dressing).  Avoid skin contact with:  Babies.   Pregnant women.   Children with eczema.   Elderly people with transplants.   People with chronic illnesses, such as leukemia or AIDS.   Wear  loose-fitting clothing to help ease the pain of material rubbing against the rash.  Keep all follow-up appointments with your caregiver.If the area involved is on your face, you may receive a referral for follow-up to a specialist, such as an eye doctor (ophthalmologist) or an ear, nose, and throat (ENT) doctor. Keeping all follow-up appointments will help you avoid eye complications, chronic pain, or disability.  SEEK IMMEDIATE MEDICAL CARE IF:   You have facial pain, pain around the eye area, or loss of feeling on one side of your face.  You have ear pain or ringing in your ear.  You have loss of taste.  Your pain is not relieved with prescribed medicines.   Your redness or swelling spreads.   You have more pain and swelling.  Your condition is worsening or has changed.   You have a feveror persistent symptoms for more than 2 3 days.  You have a fever and your symptoms suddenly get worse. MAKE SURE YOU:  Understand these instructions.  Will watch your condition.  Will get help right away if you are not doing well or get worse. Document Released: 12/26/2004 Document Revised: 09/20/2011 Document Reviewed: 08/10/2011 [facility] Patient Information 2014 [place].

## 2013-05-09 ENCOUNTER — Ambulatory Visit: Payer: Medicare Other | Admitting: Family Medicine

## 2013-06-10 DIAGNOSIS — Z1231 Encounter for screening mammogram for malignant neoplasm of breast: Secondary | ICD-10-CM | POA: Diagnosis not present

## 2013-06-10 DIAGNOSIS — Z803 Family history of malignant neoplasm of breast: Secondary | ICD-10-CM | POA: Diagnosis not present

## 2013-06-12 ENCOUNTER — Encounter: Payer: Self-pay | Admitting: Family Medicine

## 2013-06-12 ENCOUNTER — Ambulatory Visit (INDEPENDENT_AMBULATORY_CARE_PROVIDER_SITE_OTHER): Payer: Medicare Other | Admitting: Family Medicine

## 2013-06-12 VITALS — BP 117/78 | HR 69 | Temp 98.2°F | Resp 18 | Ht 61.5 in | Wt 178.2 lb

## 2013-06-12 DIAGNOSIS — IMO0001 Reserved for inherently not codable concepts without codable children: Secondary | ICD-10-CM

## 2013-06-12 DIAGNOSIS — E785 Hyperlipidemia, unspecified: Secondary | ICD-10-CM | POA: Diagnosis not present

## 2013-06-12 DIAGNOSIS — E1165 Type 2 diabetes mellitus with hyperglycemia: Principal | ICD-10-CM

## 2013-06-12 MED ORDER — GLIPIZIDE 5 MG PO TABS: 5.0000 mg | ORAL_TABLET | Freq: Two times a day (BID) | ORAL | Status: DC

## 2013-06-12 NOTE — Progress Notes (Signed)
S:  This 73 y.o. Cauc female has uncontrolled DM. Last A1c= 8.6% in February 2015. She is compliant with medication but states FSBS are the same regardless of whether she takes med or not; she wants to try a different med. She cannot tolerate Metformin and had heard about anew medication but cannot recall name. Other issue is portion sizes and weight loss.  She recently had abnormal mmg and has follow-up study within next week. She states she is not worried about it.  Patient Active Problem List   Diagnosis Date Noted  . Senile nuclear sclerosis 08/14/2012  . Type II or unspecified type diabetes mellitus without mention of complication, uncontrolled 03/10/2011  . HTN, goal below 130/80 03/10/2011  . Hyperlipidemia LDL goal < 70 03/10/2011  . Obesity (BMI 30.0-34.9) 03/10/2011  . Health care maintenance 03/10/2011   PMHx, Surg Hx, Soc and Fam Hx reviewed.    MEDICATIONS reconciled.  ROS; Negative for diaphoresis, fatigue, vision disturbance, CP or tightness, palpitations, SOB or DOE, edema, HA, dizziness, polydipsia or polyuria, HA, dizziness, numbness, weakness or syncope.  O: Filed Vitals:   06/12/13 0958  BP: 117/78  Pulse: 69  Temp: 98.2 F (36.8 C)  Resp: 18   GEN: In NAD: WN,WD. Weight stable. HENT: [place]/AT; EOMI w/ clear conj/sclerae. Otherwise unremarkable. COR: RRR. LUNGS: Unlabored resp. MS: FROM; no c/c/e. NEURO: A&O x 3; CNs intact. Nonfocal.  A/P: Type II or unspecified type diabetes mellitus without mention of complication, uncontrolled - Glimepiride ineffective; change therapy to Glucotrol 5 mg 1 tablet bid before meals. Work on portion size reduction. Plan: Ambulatory referral to Podiatry per pt request.  Hyperlipidemia LDL goal < 70- Pt intolerant of Lipitor and is hesitant to try another statin; may be able to convince her to try statin every other night. She may need to increase Omega-3 Fish Oil to 4 grams daily (need to check label for DHEA + EPA  content).  Meds ordered this encounter  Medications  . glipiZIDE (GLUCOTROL) 5 MG tablet    Sig: Take 1 tablet (5 mg total) by mouth 2 (two) times daily before a meal.    Dispense:  60 tablet    Refill:  3

## 2013-06-12 NOTE — Patient Instructions (Signed)
Women and Heart Disease Heart disease (HD) risk factors for both men and women are similar. Most studies addressing the diagnosis and treatment of heart disease have focused primarily on men. More research is now being done on women and heart disease.  GENDER DIFFERENCES  Symptoms of a heart attack for women may be more subtle, less typical and harder to identify.  Women are often slow to recognize heart disease risk factors and symptoms.  More women than men die from a heart attack before reaching a hospital.  Results of medical tests can vary by gender, especially electrocardiogram stress testing.  A common perception is that men are more likely to have heart problems. This is not true, especially in women after menopause.  Effects of estrogen, birth control and hormone therapy can have unique effects on the heart.  Women are more likely to be referred for a mental health evaluation of their symptoms. CAUSES Heart disease may be caused from conditions such as:  Buildup of fat-like deposits (plaques) in the blood vessels (coronary arteries) of the heart. The build up of fat-like deposits cause blockages that decrease the blood flow to the heart muscle.  Blockage or narrowing of the coronary arteries decreases oxygen to the heart muscle.  Abnormal heart rhythms or problems with the electrical system of the heart.  Heart muscle that has become enlarged or weak and cannot pump well.  Abnormal heart valves that either leak or are thickened and do not open and close properly.  Damage from infection or drugs.  Heart problems present at birth. RISK FACTORS  Family history.  Elevated blood lipid levels (cholesterol).  High blood pressure.  Diabetes.  Smoking.  Inactivity, lack of exercise.  Weighing 30% more than your ideal weight.  Age.  Past history of heart problems. SYMPTOMS  Chest discomfort or pressure, which may include the following:  Discomfort, fullness,  tightness, squeezing in center of chest that stays for a few minutes or comes and goes.  Discomfort or pressure that spreads to upper back, shoulders, neck, jaw or stomach.  Discomfort or tingling in the arms.  Profuse or clammy sweating.  Difficulty breathing.  Nausea.  Feeling your heart "flutter" or "jump."  Unexplained feelings of anxiety, fatigue or weakness.  Dizziness. DIAGNOSIS Diagnosis may include a test that:  Records the electrical activity of the heart and looks for changes (EKG [electrocardiogram]) .  Detects the presence of special proteins and enzymes that may show damage to the heart muscle (blood tests).  Looks at the blood flow through the heart and coronary arteries by using special dyes and X-rays (coronary angiography).  Uses sound waves to examine your heart valves, muscle function and blood flow within the heart (echo or echocardiogram).  Looks for symptoms as your heart works harder under stress (stress tests).  Creates images of the heart by detecting radiation following administration of a radioactive tracer (nuclear imaging).  Records the electrical activity of the heart and helps in detecting abnormalities of heart rhythm (electrophysiology).  Creates an image of the anatomy of the heart (CT heart scan). TREATMENT   Medications may be used to control your blood pressure, keep your heart beating regularly, reduce pain, and help your breathing.  If you are admitted to the hospital, the length of your stay depends on the amount of heart damage and any complications you may have.  Severe heart problems may require open heart surgery. This is a procedure where blocked coronary arteries are bypassed with a vein   from your leg.  If you have a single small coronary artery blockage and no heart damage, you may have a balloon angioplasty. This procedure uses stents that may help open the blockage and restore normal heart circulation. Stents are small,  wire, mesh-like tubes that help keep the artery open.  If needed, blood thinners may be used to dissolve clots. HOME CARE INSTRUCTIONS  Follow the treatment plan your caregiver prescribes.  Keep a list of every medicine you are taking. Keep it up to date and with you all the time.  Get help from your caregiver or pharmacist to learn the following about each medicine:  Why you are taking it.  What time of day to take it.  Possible side effects.  What foods to take with your medication and which foods to avoid.  When to stop taking your medication.  Try to maintain normal cholesterol levels.  Eat a heart healthy diet with salt and fat restrictions as advised. PREVENTION  You can reduce your risk of heart disease by doing the following:  Visit your caregiver regularly and determine whether you are at risk.  Quit smoking and keep away from those who smoke.  Get your blood pressure checked regularly and make sure it remains within normal limits.  Limit salt in your diet.  Maintain normal blood sugar and cholesterol levels.  Exercise regularly (walking or another form of aerobic activity, preferably 30 minutes continuously).  Maintain ideal body weight.  Reduce stress, anger, and depression.  If you have already had a heart attack, consult your caregiver about methods and medicines that may help in reducing your risk of having a second heart attack.  Be aware of the symptoms of heart disease and seek medical care if you develop these symptoms. SEEK IMMEDIATE MEDICAL CARE IF:  You have severe chest pain or the above symptoms, call your local emergency services (911 if in [place] THIS IS AN EMERGENCY. Do not wait to see if the pain will go away. DO NOT drive yourself to the hospital.  You notice increasing shortness of breath during rest, sleeping or with activity. Insist that providers take your complaints seriously and do a thorough heart evaluation. Document Released:  06/14/2007 Document Revised: 03/20/2011 Document Reviewed: 06/14/2007 [employer] Patient Information 2014 [employer].  

## 2013-06-13 DIAGNOSIS — R928 Other abnormal and inconclusive findings on diagnostic imaging of breast: Secondary | ICD-10-CM | POA: Diagnosis not present

## 2013-06-26 ENCOUNTER — Encounter: Payer: Self-pay | Admitting: Family Medicine

## 2013-07-02 ENCOUNTER — Encounter: Payer: Self-pay | Admitting: Podiatry

## 2013-07-02 ENCOUNTER — Ambulatory Visit (INDEPENDENT_AMBULATORY_CARE_PROVIDER_SITE_OTHER): Payer: Medicare Other | Admitting: Podiatry

## 2013-07-02 VITALS — BP 167/84 | HR 82 | Resp 16 | Ht 61.75 in | Wt 130.0 lb

## 2013-07-02 DIAGNOSIS — M79609 Pain in unspecified limb: Secondary | ICD-10-CM | POA: Diagnosis not present

## 2013-07-02 DIAGNOSIS — M79673 Pain in unspecified foot: Secondary | ICD-10-CM

## 2013-07-02 DIAGNOSIS — B351 Tinea unguium: Secondary | ICD-10-CM

## 2013-07-02 NOTE — Patient Instructions (Signed)
Diabetes and Foot Care Diabetes may cause you to have problems because of poor blood supply (circulation) to your feet and legs. This may cause the skin on your feet to become thinner, break easier, and heal more slowly. Your skin may become dry, and the skin may peel and crack. You may also have nerve damage in your legs and feet causing decreased feeling in them. You may not notice minor injuries to your feet that could lead to infections or more serious problems. Taking care of your feet is one of the most important things you can do for yourself.  HOME CARE INSTRUCTIONS  Wear shoes at all times, even in the house. Do not go barefoot. Bare feet are easily injured.  Check your feet daily for blisters, cuts, and redness. If you cannot see the bottom of your feet, use a mirror or ask someone for help.  Wash your feet with warm water (do not use hot water) and mild soap. Then pat your feet and the areas between your toes until they are completely dry. Do not soak your feet as this can dry your skin.  Apply a moisturizing lotion or petroleum jelly (that does not contain alcohol and is unscented) to the skin on your feet and to dry, brittle toenails. Do not apply lotion between your toes.  Trim your toenails straight across. Do not dig under them or around the cuticle. File the edges of your nails with an emery board or nail file.  Do not cut corns or calluses or try to remove them with medicine.  Wear clean socks or stockings every day. Make sure they are not too tight. Do not wear knee-high stockings since they may decrease blood flow to your legs.  Wear shoes that fit properly and have enough cushioning. To break in new shoes, wear them for just a few hours a day. This prevents you from injuring your feet. Always look in your shoes before you put them on to be sure there are no objects inside.  Do not cross your legs. This may decrease the blood flow to your feet.  If you find a minor scrape,  cut, or break in the skin on your feet, keep it and the skin around it clean and dry. These areas may be cleansed with mild soap and water. Do not cleanse the area with peroxide, alcohol, or iodine.  When you remove an adhesive bandage, be sure not to damage the skin around it.  If you have a wound, look at it several times a day to make sure it is healing.  Do not use heating pads or hot water bottles. They may burn your skin. If you have lost feeling in your feet or legs, you may not know it is happening until it is too late.  Make sure your health care Kalik Hoare performs a complete foot exam at least annually or more often if you have foot problems. Report any cuts, sores, or bruises to your health care Bev Drennen immediately. SEEK MEDICAL CARE IF:   You have an injury that is not healing.  You have cuts or breaks in the skin.  You have an ingrown nail.  You notice redness on your legs or feet.  You feel burning or tingling in your legs or feet.  You have pain or cramps in your legs and feet.  Your legs or feet are numb.  Your feet always feel cold. SEEK IMMEDIATE MEDICAL CARE IF:   There is increasing redness,   swelling, or pain in or around a wound.  There is a red line that goes up your leg.  Pus is coming from a wound.  You develop a fever or as directed by your health care Alayssa Flinchum.  You notice a bad smell coming from an ulcer or wound. Document Released: 12/24/1999 Document Revised: 08/28/2012 Document Reviewed: 06/04/2012 [employer] Patient Information 2015 [employer]. This information is not intended to replace advice given to you by your health care Jahnavi Muratore. Make sure you discuss any questions you have with your health care Ama Mcmaster.  

## 2013-07-02 NOTE — Progress Notes (Signed)
   Subjective:    Patient ID: Wanda [name], female    DOB: [birth year], 73 y.o.   MRN: [id number]  HPI Comments: N toe pain L right 3, 4, 5 toenails D 6 months O history of right 5th toe fracture 2 years ago C squeezing sensation A lay foot to lateral side T change positions often  Pt complains of thick, discolored right 3, 4, 5 toenails for less than 1 year.  Pt states she has been treating the toenails with tea tree oil.  Diabetes  Toe Pain    patient denies any history of skin ulcers or claudication    Review of Systems  Genitourinary: Positive for frequency.  Musculoskeletal: Positive for arthralgias.  All other systems reviewed and are negative.      Objective:   Physical Exam Orientated x3 white female  Vascular: DP and PT pulses 2/4 bilaterally  Neurological: Sensation to 10 g monofilament her tach 5/5 bilaterally Vibratory sensation intact bilaterally Ankle reflexes equal and reactive bilaterally  Dermatological: Texture and turgor within normal limits bilaterally Toenails 3, 4, 5 right are hypertrophic, discolored, incurvated and tender Remaining toenails right and left has texture and color changes are elongated  Musculoskeletal: HAV deformity right There is no restriction of ankle, subtalar,, midtarsal joints bilaterally      Assessment & Plan:   Assessment: Satisfactory neurovascular status Symptomatic onychomycoses  Plan: Discussed treatment options with patient including no treatment, debridement, topical medication and oral medication. Patient is requesting nail debridement  Nails x10 were debrided without a bleeding  Reappoint x3 months for nail debridement

## 2013-07-30 ENCOUNTER — Ambulatory Visit (INDEPENDENT_AMBULATORY_CARE_PROVIDER_SITE_OTHER): Payer: Medicare Other | Admitting: Family Medicine

## 2013-07-30 ENCOUNTER — Encounter: Payer: Self-pay | Admitting: Family Medicine

## 2013-07-30 VITALS — BP 130/78 | HR 70 | Temp 97.9°F | Resp 16 | Ht 60.5 in | Wt 174.4 lb

## 2013-07-30 DIAGNOSIS — IMO0001 Reserved for inherently not codable concepts without codable children: Secondary | ICD-10-CM | POA: Diagnosis not present

## 2013-07-30 DIAGNOSIS — Z888 Allergy status to other drugs, medicaments and biological substances status: Secondary | ICD-10-CM | POA: Diagnosis not present

## 2013-07-30 DIAGNOSIS — E785 Hyperlipidemia, unspecified: Secondary | ICD-10-CM | POA: Diagnosis not present

## 2013-07-30 DIAGNOSIS — Z789 Other specified health status: Secondary | ICD-10-CM

## 2013-07-30 DIAGNOSIS — E1165 Type 2 diabetes mellitus with hyperglycemia: Principal | ICD-10-CM

## 2013-07-30 LAB — LIPID PANEL
CHOL/HDL RATIO: 5.5 ratio
CHOLESTEROL: 277 mg/dL — AB (ref 0–200)
HDL: 50 mg/dL (ref >39)
LDL Cholesterol: 191 mg/dL — ABNORMAL HIGH (ref 0–99)
TRIGLYCERIDES: 178 mg/dL — AB (ref <150)
VLDL: 36 mg/dL (ref 0–40)

## 2013-07-30 LAB — BASIC METABOLIC PANEL
BUN: 16 mg/dL (ref 6–23)
CHLORIDE: 106 meq/L (ref 96–112)
CO2: 23 mEq/L (ref 19–32)
CREATININE: 0.85 mg/dL (ref 0.50–1.10)
Calcium: 10 mg/dL (ref 8.4–10.5)
Glucose, Bld: 199 mg/dL — ABNORMAL HIGH (ref 70–99)
Potassium: 4.8 mEq/L (ref 3.5–5.3)
Sodium: 140 mEq/L (ref 135–145)

## 2013-07-30 LAB — POCT GLYCOSYLATED HEMOGLOBIN (HGB A1C): Hemoglobin A1C: 12

## 2013-07-30 MED ORDER — INSULIN PEN NEEDLE 32G X 4 MM MISC: 16.0000 [IU] | Freq: Every day | Status: DC

## 2013-07-30 MED ORDER — INSULIN DETEMIR 100 UNIT/ML FLEXPEN: 15.0000 [IU] | PEN_INJECTOR | Freq: Every day | SUBCUTANEOUS | Status: DC

## 2013-07-30 MED ORDER — GLUCOSE BLOOD VI STRP: ORAL_STRIP | Status: DC

## 2013-07-30 NOTE — Patient Instructions (Addendum)
NEW MEDICATION- LEVEMIR basal insulin  16 units at bedtime. If you have questions about administration, ask your pharmacist. They can usually help with making sure you are comfortable with self-injection.  Check you blood sugars as we discussed. Our goal is to get the fasting morning sugar to 100-120.  Please call if you have any questions or concerns.

## 2013-07-30 NOTE — Progress Notes (Signed)
S:  This 73 y.o. Cauc female is here for follow-up; she has Type II DM and is intolerant of most of the oral medications. She tried Glucotrol and had "migraine-like color flashes in L eye w/ dizziness and lightheadedness". Her daughter has recently diagnosed w/ Diabetes and is taking Metformin and Victoza. Pt does not want to try Victoza due to family hx of fatal pancreatitis. Her FSBS this AM = 200+. She does try to follow a good nutrition plan and stays active (swimming, walking, etc).  Patient Active Problem List   Diagnosis Date Noted  . Senile nuclear sclerosis 08/14/2012  . Type II or unspecified type diabetes mellitus without mention of complication, uncontrolled 03/10/2011  . HTN, goal below 130/80 03/10/2011  . Hyperlipidemia with target LDL less than 70 03/10/2011  . Obesity (BMI 30.0-34.9) 03/10/2011  . Health care maintenance 03/10/2011    Prior to Admission medications   Medication Sig Start Date End Date Taking? Authorizing Provider  cholecalciferol (VITAMIN D) 1000 UNITS tablet Take 1,000 Units by mouth daily.     Yes Historical Provider, MD  vitamin E 200 UNIT capsule Take 200 Units by mouth daily.     Yes Historical Provider, MD  glucose blood test strip Use as instructed    Barton Fanny, MD   PMHx, Surg Hx, Soc and Fam Hx reviewed.  ROS: Negative for fatigue, diaphoresis, abnormal weight change, vision changes, CP or tightness, palpitations, SOB or DOE, edema, abd problems/GI upset, myalgias, HA, numbness, weakness or syncope.   O: Filed Vitals:   07/30/13 0949  BP: 130/78  Pulse: 70  Temp: 97.9 F (36.6 C)  Resp: 16   GENL In NAD; WN,WD. HENT: [place]/AT. EOMI w/ clear conj/sclerae. Otherwise unremarkable. COR: RRR. LUNGS: Unlabored resp. SKIN: W&D; intact w/o diaphoresis, erythema or pallor. NEURO: A&O x 3; CNs intact. Nonfocal.   Results for orders placed in visit on 07/30/13  POCT GLYCOSYLATED HEMOGLOBIN (HGB A1C)      Result Value Ref Range   Hemoglobin A1C 12.0       A/P: Type II or unspecified type diabetes mellitus without mention of complication, uncontrolled - Start Insulin- Levemir 16 units at bedtime. Pt comfortable with basal Insulin start. [facility] staff [place], CMA) demonstrated administration of Insulin for pt. Advised about FSBS monitoring bid-tid. Pt voices that she feels comfortable about Insulin use. Plan: [facility] Diabetes Foot Exam, POCT glycosylated hemoglobin (Hb A1C), Lipid panel, Basic metabolic panel  Hyperlipidemia with target LDL less than 70  Medication intolerance  Meds ordered this encounter  Medications  . Insulin Detemir (LEVEMIR) 100 UNIT/ML Pen    Sig: Inject 15 Units into the skin daily at 10 pm.    Dispense:  15 mL    Refill:  5  . Insulin Pen Needle (ULTICARE MICRO PEN NEEDLES) 32G X 4 MM MISC    Sig: 16 Units by Does not apply route at bedtime.    Dispense:  50 each    Refill:  5    Dispense 32 G "NANO" pen needles - length appropriate for adult patient.  Marland Kitchen glucose blood test strip    Sig: Use as instructed    Dispense:  100 each    Refill:  12    Patient needs Ultra One Touch strips.

## 2013-08-07 ENCOUNTER — Telehealth: Payer: Self-pay

## 2013-08-07 NOTE — Telephone Encounter (Signed)
[place] [employer] Dept sent form for DM supplies. Completed form and sent to 104 for Dr Leward Quan to sign.

## 2013-08-12 ENCOUNTER — Telehealth: Payer: Self-pay | Admitting: *Deleted

## 2013-08-12 NOTE — Telephone Encounter (Signed)
Faxed signed order to [place], per Dr Leward Quan. Confirmation page received at 4:16 pm.

## 2013-08-15 ENCOUNTER — Ambulatory Visit (INDEPENDENT_AMBULATORY_CARE_PROVIDER_SITE_OTHER): Payer: Medicare Other | Admitting: Family Medicine

## 2013-08-15 ENCOUNTER — Encounter: Payer: Self-pay | Admitting: Family Medicine

## 2013-08-15 VITALS — BP 140/90 | HR 70 | Temp 98.3°F | Resp 16 | Ht 60.5 in | Wt 174.2 lb

## 2013-08-15 DIAGNOSIS — M79609 Pain in unspecified limb: Secondary | ICD-10-CM | POA: Diagnosis not present

## 2013-08-15 DIAGNOSIS — E119 Type 2 diabetes mellitus without complications: Secondary | ICD-10-CM | POA: Diagnosis not present

## 2013-08-15 DIAGNOSIS — M79674 Pain in right toe(s): Secondary | ICD-10-CM

## 2013-08-16 ENCOUNTER — Encounter: Payer: Self-pay | Admitting: Family Medicine

## 2013-08-16 NOTE — Progress Notes (Signed)
S:  This 73 y.o. Cauc female has Type II DM, failed oral medications. At last visit, basal insulin was started. Pt feels very comfortable with administration. She increased dose to 18 units per our discussion. FSBS average in mid 100s (150-170); high= 326. She can correlate high values w/ food or activity level. She has no hypoglycemic symptoms. Only problem with basal insulin (Levemir) is cost ($400.00). She does not want to change to a less costly insulin at this time but we did discuss alternatives.  Pt has a painful toe on R foot; she had podiatric evaluation at [place]. HAd nails trimmed. She requests second opinion at another podiatry practice.   Patient Active Problem List   Diagnosis Date Noted  . Senile nuclear sclerosis 08/14/2012  . Type II or unspecified type diabetes mellitus without mention of complication, uncontrolled 03/10/2011  . HTN, goal below 130/80 03/10/2011  . Hyperlipidemia with target LDL less than 70 03/10/2011  . Obesity (BMI 30.0-34.9) 03/10/2011  . Health care maintenance 03/10/2011    Prior to Admission medications   Medication Sig Start Date End Date Taking? Authorizing Provider  cholecalciferol (VITAMIN D) 1000 UNITS tablet Take 1,000 Units by mouth daily.     Yes Historical Provider, MD  glucose blood test strip Use as instructed 07/30/13  Yes Barton Fanny, MD  Insulin Detemir (LEVEMIR) 100 UNIT/ML Pen Inject 18 Units into the skin daily at 10 pm. 07/30/13  Yes Barton Fanny, MD  Insulin Pen Needle (ULTICARE MICRO PEN NEEDLES) 32G X 4 MM MISC 16 Units by Does not apply route at bedtime. 07/30/13  Yes Barton Fanny, MD  vitamin E 200 UNIT capsule Take 200 Units by mouth daily.     Yes Historical Provider, MD    Allergies  Allergen Reactions  . Ace Inhibitors Swelling and Cough    Pt had cough and diarrhea with first few doses of medication; also had swelling of right eyelid. Stopped medication on 03/17/11.  . Alendronate Sodium  Other (See Comments)    Whole body hurt  . Aspirin Hives  . Glucotrol [Glipizide] Other (See Comments)    Migraine "color flashes" in L eye with dizziness and lightheadedness.  . Lipitor [Atorvastatin Calcium] Other (See Comments)    Whole body hurt  . Metformin And Related     Severe abdominal pain    PMHx, Surg Hx, Soc and Fam Hx reviewed.   ROS: As per HPI; otherwise, noncontributory.  O: Filed Vitals:   08/15/13 1137  BP: 140/90  Pulse: 70  Temp: 98.3 F (36.8 C)  Resp: 16   GEN: In NAD; WN,WD. HENT: [place]/AT. EOMI w/ clear conj/sclerae. Ext ears/nose/oroph unrmearkable. COR: RRR. LUNGS: Normal resp rate and effort. SKIN: W&D; intact w/o diaphoresis or erythema. See DM FOOT EXAM. NEURO: A&O x 3; CNs intact. Nonfocal.  A/P: Type II or unspecified type diabetes mellitus without mention of complication, not stated as uncontrolled - pt adjusting well to use of basal insulin. Comfortable with dose increase. She will increase dose to 20 units hs for 5-7 days then increase to 24 units. Continue to monitor FSBS, nutrition and exercise level. Plan: [facility] Diabetes Foot Exam  Pain of toe of right foot - Plan: Ambulatory referral to Podiatry

## 2013-08-20 ENCOUNTER — Telehealth: Payer: Self-pay | Admitting: Family Medicine

## 2013-08-20 ENCOUNTER — Other Ambulatory Visit: Payer: Self-pay

## 2013-08-20 MED ORDER — GLUCOSE BLOOD VI STRP: ORAL_STRIP | Status: DC

## 2013-08-20 NOTE — Telephone Encounter (Signed)
Called an cancelled patients appt at [facility]

## 2013-08-22 DIAGNOSIS — D3130 Benign neoplasm of unspecified choroid: Secondary | ICD-10-CM | POA: Diagnosis not present

## 2013-08-22 DIAGNOSIS — E119 Type 2 diabetes mellitus without complications: Secondary | ICD-10-CM | POA: Diagnosis not present

## 2013-08-22 DIAGNOSIS — H2510 Age-related nuclear cataract, unspecified eye: Secondary | ICD-10-CM | POA: Diagnosis not present

## 2013-08-22 DIAGNOSIS — H40059 Ocular hypertension, unspecified eye: Secondary | ICD-10-CM | POA: Diagnosis not present

## 2013-08-26 ENCOUNTER — Telehealth: Payer: Self-pay

## 2013-08-26 DIAGNOSIS — IMO0002 Reserved for concepts with insufficient information to code with codable children: Secondary | ICD-10-CM

## 2013-08-26 NOTE — Telephone Encounter (Signed)
DR.MCPHERSON, PT STATES THAT THE CURRENT INSULIN THAT SHE IS ON IS NOT WORKING FOR HER, SHE HAS ALSO DROPPED OFF A FORM TO BE COMPLETED FROM MEDICARE (LOCATED IN YOUR BOX). ALSO ATTACHED TO THAT FORM IS A CARD TO A PODIATRIST THAT SHE WOULD LIKE TO HAVE A REFERRAL TO WHICH IS [facility] WITH DR. MARTHA AJLOUNY DPM.  BEST# [phone]

## 2013-08-29 NOTE — Telephone Encounter (Signed)
Pt needs to continue to increase Insulin dose by 4 units every 4-5 days until most of her sugars are under 150; she should not increase past 35 units daily. She has a follow-up appt w/ me 09/26/13. We may have to add mealtime Insulin but we will discuss that at her next visit.  I have the form about the referral.

## 2013-09-02 NOTE — Telephone Encounter (Signed)
Podiatry referral ordered and Medicare form completed and mailed for DM supplies.

## 2013-09-05 DIAGNOSIS — B351 Tinea unguium: Secondary | ICD-10-CM | POA: Diagnosis not present

## 2013-09-05 DIAGNOSIS — E119 Type 2 diabetes mellitus without complications: Secondary | ICD-10-CM | POA: Diagnosis not present

## 2013-09-05 DIAGNOSIS — M2060 Acquired deformities of toe(s), unspecified, unspecified foot: Secondary | ICD-10-CM | POA: Diagnosis not present

## 2013-09-26 ENCOUNTER — Encounter: Payer: Self-pay | Admitting: Family Medicine

## 2013-09-26 ENCOUNTER — Ambulatory Visit (INDEPENDENT_AMBULATORY_CARE_PROVIDER_SITE_OTHER): Payer: Medicare Other | Admitting: Family Medicine

## 2013-09-26 VITALS — BP 120/74 | HR 78 | Temp 97.6°F | Resp 16 | Ht 60.5 in | Wt 172.0 lb

## 2013-09-26 DIAGNOSIS — IMO0001 Reserved for inherently not codable concepts without codable children: Secondary | ICD-10-CM

## 2013-09-26 DIAGNOSIS — T383X5A Adverse effect of insulin and oral hypoglycemic [antidiabetic] drugs, initial encounter: Secondary | ICD-10-CM | POA: Diagnosis not present

## 2013-09-26 DIAGNOSIS — M2060 Acquired deformities of toe(s), unspecified, unspecified foot: Secondary | ICD-10-CM | POA: Diagnosis not present

## 2013-09-26 DIAGNOSIS — E1165 Type 2 diabetes mellitus with hyperglycemia: Principal | ICD-10-CM

## 2013-09-26 LAB — POCT GLYCOSYLATED HEMOGLOBIN (HGB A1C): HEMOGLOBIN A1C: 9.6

## 2013-09-26 NOTE — Patient Instructions (Addendum)
Your Diabetes number has come down to 9.6%.  I have ordered a referral to ENDOCRINE SPECIALIST- Dr. Cruzita Lederer at [facility]. You should be hearing about an appointment before the end of the month. Just try to manage your diet very carefully and drink adequate water to keep your urine clear and very dilute.  You also have a lipid disorder and will need to discuss this with the endocrine specialist. Dr. Cruzita Lederer may have some ideas about treating this since you do not tolerate statins. She will be able to see your labs through this electronic medical record.

## 2013-09-27 ENCOUNTER — Encounter: Payer: Self-pay | Admitting: Family Medicine

## 2013-09-27 DIAGNOSIS — T383X5A Adverse effect of insulin and oral hypoglycemic [antidiabetic] drugs, initial encounter: Secondary | ICD-10-CM | POA: Insufficient documentation

## 2013-09-27 MED ORDER — INSULIN DETEMIR 100 UNIT/ML FLEXPEN: 15.0000 [IU] | PEN_INJECTOR | Freq: Every day | SUBCUTANEOUS | Status: DC

## 2013-09-27 NOTE — Progress Notes (Signed)
S:  This 73 y.o. Cauc female has Type II DM diagnosed in 2013; she had a poor response to oral medications and did not tolerate Metformin. Insulin therapy (Levemir) was initiated in July 2015; A1c= 12% at that time. Pt was able to use insulin properly and had good results initially. She reports today that, as dose was increased, she developed bone pain and mild headache ~ 2 weeks ago. When she felt mildly ill and weak in general, she stopped the insulin on 09/17/2013. She feels better ("normal") but is concerned about elevated blood sugars = 180-250. She has been extremely aggressive about managing her meal planning but the situation is depressing and discouraging. She admitted to have some [employer] ice cream one night for comfort.  She denies fever/chills, fatigue, vision disturbances, CP or tightness, palpitations, SOB or DOE, edema, GI problems, rashes or erythema, HA, dizziness, numbness or syncope. She has no cognitive or memory problems, agitation, confusion, sleep disturbance or thoughts of self harm.  Patient Active Problem List   Diagnosis Date Noted  . Senile nuclear sclerosis 08/14/2012  . Type II or unspecified type diabetes mellitus without mention of complication, uncontrolled 03/10/2011  . HTN, goal below 130/80 03/10/2011  . Hyperlipidemia with target LDL less than 70 03/10/2011  . Obesity (BMI 30.0-34.9) 03/10/2011  . Health care maintenance 03/10/2011    Prior to Admission medications   Medication Sig Start Date End Date Taking? Authorizing Provider  cholecalciferol (VITAMIN D) 1000 UNITS tablet Take 1,000 Units by mouth daily.     Yes Historical Provider, MD  vitamin E 200 UNIT capsule Take 200 Units by mouth daily.     Yes Historical Provider, MD  glucose blood test strip Test blood sugar 3 times a day. Dx code: 250.00 08/20/13   Collene Leyden, PA-C  glucose blood test strip Test blood sugar 3 times daily. Dx code: 250.00 08/20/13   Collene Leyden, PA-C  Insulin Detemir  (LEVEMIR) 100 UNIT/ML Pen Inject 15 Units into the skin daily at 10 pm.    Barton Fanny, MD  Insulin Pen Needle (ULTICARE MICRO PEN NEEDLES) 32G X 4 MM MISC 16 Units by Does not apply route at bedtime. 07/30/13   Barton Fanny, MD    History   Social History  . Marital Status: Unknown    Spouse Name: N/A    Number of Children: N/A  . Years of Education: N/A   Occupational History  . Not on file.   Social History Main Topics  . Smoking status: Never Smoker   . Smokeless tobacco: Never Used  . Alcohol Use: 0.5 oz/week    1 drink(s) per week  . Drug Use: No  . Sexual Activity: Not on file   Other Topics Concern  . Not on file   Social History Narrative   Widowed. Education: [employer]. Exercise: 2 times a week for 30 minutes.    Family History  Problem Relation Age of Onset  . Pancreatitis Father     deceased 54  . Cancer    . Cancer Brother     GI  . Cancer Mother     liver  . Cancer Son     terminal kidney    ROS: As per HPI.   O: Filed Vitals:   09/26/13 1101  BP: 120/74  Pulse: 78  Temp: 97.6 F (36.4 C)  Resp: 16    GEN: In NAD; WN,WD. HENT: [place]/AT; EOMI w/ clear conj/sclerae. Ext ears/nose/  oroph unremarkable. COR: RRR. No edema. LUNGS: Unlabored resp. Normal rate. SKIN: W&D; intact w/o erythema, rash  or pallor. MS: MAEs; no deformities, muscle atrophy or cyanosis. NEURO: A&O x 3; CNs intact. Nonfocal.   Results for orders placed in visit on 09/26/13  POCT GLYCOSYLATED HEMOGLOBIN (HGB A1C)      Result Value Ref Range   Hemoglobin A1C 9.6      A/P: Type II or unspecified type diabetes mellitus without mention of complication, uncontrolled - Rather than experiment with another Insulin, pt will be referred to Endocrine Specialist. Pt agrees to this plan. She should continue meal planning and healthy lifestyle. Pt had significant dyslipidemia and will need counseling and treatment of this condition as well. Plan: POCT glycosylated  hemoglobin (Hb A1C), Ambulatory referral to Endocrinology  Insulin adverse reaction, initial encounter - Discontinue Levemir and hold off prescribing any Insulin at this point. Pt hesitant to try another medication. Plan: Ambulatory referral to Endocrinology

## 2013-10-08 ENCOUNTER — Ambulatory Visit: Payer: Medicare Other | Admitting: Podiatry

## 2013-10-09 ENCOUNTER — Encounter: Payer: Self-pay | Admitting: Internal Medicine

## 2013-10-09 ENCOUNTER — Ambulatory Visit (INDEPENDENT_AMBULATORY_CARE_PROVIDER_SITE_OTHER): Payer: Medicare Other | Admitting: Internal Medicine

## 2013-10-09 VITALS — BP 132/82 | HR 100 | Temp 97.9°F | Resp 12 | Ht 61.0 in | Wt 174.8 lb

## 2013-10-09 DIAGNOSIS — E1165 Type 2 diabetes mellitus with hyperglycemia: Secondary | ICD-10-CM | POA: Diagnosis not present

## 2013-10-09 DIAGNOSIS — E11319 Type 2 diabetes mellitus with unspecified diabetic retinopathy without macular edema: Secondary | ICD-10-CM | POA: Insufficient documentation

## 2013-10-09 DIAGNOSIS — E1169 Type 2 diabetes mellitus with other specified complication: Secondary | ICD-10-CM | POA: Insufficient documentation

## 2013-10-09 DIAGNOSIS — IMO0001 Reserved for inherently not codable concepts without codable children: Secondary | ICD-10-CM

## 2013-10-09 DIAGNOSIS — E119 Type 2 diabetes mellitus without complications: Secondary | ICD-10-CM | POA: Insufficient documentation

## 2013-10-09 MED ORDER — INSULIN GLARGINE 100 UNIT/ML SOLOSTAR PEN: 15.0000 [IU] | PEN_INJECTOR | Freq: Every day | SUBCUTANEOUS | Status: DC

## 2013-10-09 NOTE — Patient Instructions (Signed)
Please start Lantus 10 units at bedtime for 1 week. If sugars not <150 in am, then increase to 13 units for another week. If sugars not <15 in am, increase to 15 units at bedtime.  Please return in 1 month with your sugar log.   PATIENT INSTRUCTIONS FOR TYPE 2 DIABETES:  **Please join MyChart!** - see attached instructions about how to join if you have not done so already.  DIET AND EXERCISE Diet and exercise is an important part of diabetic treatment.  We recommended aerobic exercise in the form of brisk walking (working between 40-60% of maximal aerobic capacity, similar to brisk walking) for 150 minutes per week (such as 30 minutes five days per week) along with 3 times per week performing 'resistance' training (using various gauge rubber tubes with handles) 5-10 exercises involving the major muscle groups (upper body, lower body and core) performing 10-15 repetitions (or near fatigue) each exercise. Start at half the above goal but build slowly to reach the above goals. If limited by weight, joint pain, or disability, we recommend daily walking in a swimming pool with water up to waist to reduce pressure from joints while allow for adequate exercise.    BLOOD GLUCOSES Monitoring your blood glucoses is important for continued management of your diabetes. Please check your blood glucoses 2-4 times a day: fasting, before meals and at bedtime (you can rotate these measurements - e.g. one day check before the 3 meals, the next day check before 2 of the meals and before bedtime, etc.).   HYPOGLYCEMIA (low blood sugar) Hypoglycemia is usually a reaction to not eating, exercising, or taking too much insulin/ other diabetes drugs.  Symptoms include tremors, sweating, hunger, confusion, headache, etc. Treat IMMEDIATELY with 15 grams of Carbs:   4 glucose tablets    cup regular juice/soda   2 tablespoons raisins   4 teaspoons sugar   1 tablespoon honey Recheck blood glucose in 15 mins and repeat  above if still symptomatic/blood glucose <100.  RECOMMENDATIONS TO REDUCE YOUR RISK OF DIABETIC COMPLICATIONS: * Take your prescribed MEDICATION(S) * Follow a DIABETIC diet: Complex carbs, fiber rich foods, (monounsaturated and polyunsaturated) fats * AVOID saturated/trans fats, high fat foods, >2,300 mg salt per day. * EXERCISE at least 5 times a week for 30 minutes or preferably daily.  * DO NOT SMOKE OR DRINK more than 1 drink a day. * Check your FEET every day. Do not wear tightfitting shoes. Contact us if you develop an ulcer * See your EYE doctor once a year or more if needed * Get a FLU shot once a year * Get a PNEUMONIA vaccine once before and once after age 54 years  GOALS:  * Your Hemoglobin A1c of <7%  * fasting sugars need to be <130 * after meals sugars need to be <180 (2h after you start eating) * Your Systolic BP should be 578 or lower  * Your Diastolic BP should be 80 or lower  * Your HDL (Good Cholesterol) should be 40 or higher  * Your LDL (Bad Cholesterol) should be 100 or lower. * Your Triglycerides should be 150 or lower  * Your Urine microalbumin (kidney function) should be <30 * Your Body Mass Index should be 25 or lower    Please consider the following ways to cut down carbs and fat and increase fiber and micronutrients in your diet: - substitute whole grain for white bread or pasta - substitute brown rice for white rice - substitute  90-calorie flat bread pieces for slices of bread when possible - substitute sweet potatoes or yams for white potatoes - substitute humus for margarine - substitute tofu for cheese when possible - substitute almond or rice milk for regular milk (would not drink soy milk daily due to concern for soy estrogen influence on breast cancer risk) - substitute dark chocolate for other sweets when possible - substitute water - can add lemon or orange slices for taste - for diet sodas (artificial sweeteners will trick your body that you  can eat sweets without getting calories and will lead you to overeating and weight gain in the long run) - do not skip breakfast or other meals (this will slow down the metabolism and will result in more weight gain over time)  - can try smoothies made from fruit and almond/rice milk in am instead of regular breakfast - can also try old-fashioned (not instant) oatmeal made with almond/rice milk in am - order the dressing on the side when eating salad at a restaurant (pour less than half of the dressing on the salad) - eat as little meat as possible - can try juicing, but should not forget that juicing will get rid of the fiber, so would alternate with eating raw veg./fruits or drinking smoothies - use as little oil as possible, even when using olive oil - can dress a salad with a mix of balsamic vinegar and lemon juice, for e.g. - use agave nectar, stevia sugar, or regular sugar rather than artificial sweateners - steam or broil/roast veggies  - snack on veggies/fruit/nuts (unsalted, preferably) when possible, rather than processed foods - reduce or eliminate aspartame in diet (it is in diet sodas, chewing gum, etc) Read the labels!  Try to read Dr. Janene Harvey book: "Program for Reversing Diabetes" for other ideas for healthy eating.

## 2013-10-09 NOTE — Progress Notes (Addendum)
Patient ID: Wanda [name], female   DOB: 1940/08/17, 73 y.o.   MRN: [id number]  HPI: Wanda [name] is a 73 y.o.-year-old female, referred by her PCP, Dr. Leward Quan, for management of DM2, non-insulin-dependent, uncontrolled, without complications and also HL, intolerant to statins.  Patient has been diagnosed with diabetes in 2007; she started Levemir in 07/2013. She since stopped it (on 09/17/2013) as she had lower leg bone pain, blurred vision, mental fog.  Last hemoglobin A1c was: Lab Results  Component Value Date   HGBA1C 9.6 09/26/2013   HGBA1C 12.0 07/30/2013   HGBA1C 8.6* 02/28/2013   Pt is not on any medicines for DM.  She tried Metformin >> abdominal pain She tries Glymepiride >> stopped working She tries Glipizide >> dizziness  Pt checks her sugars 2-3 a day and they are: - am: 113, 146, 220-314 - 2h after b'fast: 240, 253 - before lunch: 170-283 - 2h after lunch: 172-271, 425  ([employer]) - before dinner: 241, 320, 372 - 2h after dinner: 237, 270 - bedtime: - nighttime: No lows. Lowest sugar was 113; ? If has hypoglycemia awareness. Highest sugar was 425.  Pt's meals are: - Breakfast: yoghurt + fruit; eggs + onion, peppers - Lunch: salad & sardines - Dinner: meat + veggies - Snacks: 2-3: fruit, cheese, nuts  - no CKD, last BUN/creatinine:  Lab Results  Component Value Date   BUN 16 07/30/2013   CREATININE 0.85 07/30/2013   - last set of lipids: Lab Results  Component Value Date   CHOL 277* 07/30/2013   HDL 50 07/30/2013   LDLCALC 191* 07/30/2013   TRIG 178* 07/30/2013   CHOLHDL 5.5 07/30/2013  She started statins >> "muscles frozen". She tries Fish oil. - last eye exam was in 08/2013. No DR.  - no numbness and tingling in her feet.  Pt has FH of DM in PGM. Sister and father died of pancreatitis.  ROS: Constitutional: no weight gain/loss, no fatigue, no subjective hyperthermia/hypothermia, + increased urination Eyes: no blurry vision, no  xerophthalmia ENT: no sore throat, no nodules palpated in throat, no dysphagia/odynophagia, no hoarseness, + mild tinnitus Cardiovascular: no CP/SOB/palpitations/leg swelling Respiratory: no cough/SOB Gastrointestinal: no N/V/D/C Musculoskeletal: no muscle/+ joint aches (knees) Skin: no rashes, + itching Neurological: no tremors/numbness/tingling/dizziness Psychiatric: no depression/anxiety  Past Medical History  Diagnosis Date  . Hypertension   . Diabetes mellitus   . Arthritis   . Cataract    Past Surgical History  Procedure Laterality Date  . Abdominal hysterectomy  early 80's    total  . Carpal tunnel release  1980 and 1981    both hands  . Tonsillectomy and adenoidectomy  age 45  . Tubal ligation     History   Social History  . Marital Status: Unknown    Spouse Name: N/A    Number of Children: 5   Occupational History  . retired   Social History Main Topics  . Smoking status: Never Smoker   . Smokeless tobacco: Never Used  . Alcohol Use: 0.5 oz/week    1 drink(s) per week  . Drug Use: No   Social History Narrative   Widowed. Education: [employer]. Exercise: 2 times a week for 30 minutes.   Current Outpatient Prescriptions on File Prior to Visit  Medication Sig Dispense Refill  . cholecalciferol (VITAMIN D) 1000 UNITS tablet Take 1,000 Units by mouth daily.        Marland Kitchen glucose blood test strip Test blood sugar 3 times a day.  Dx code: 250.00  100 each  12  . glucose blood test strip Test blood sugar 3 times daily. Dx code: 250.00  100 each  12  . vitamin E 200 UNIT capsule Take 200 Units by mouth daily.        . Insulin Pen Needle (ULTICARE MICRO PEN NEEDLES) 32G X 4 MM MISC 16 Units by Does not apply route at bedtime.  50 each  5  . [DISCONTINUED] lisinopril (PRINIVIL,ZESTRIL) 20 MG tablet Take 20 mg by mouth daily.        No current facility-administered medications on file prior to visit.   Allergies  Allergen Reactions  . Ace Inhibitors Swelling and Cough     Pt had cough and diarrhea with first few doses of medication; also had swelling of right eyelid. Stopped medication on 03/17/11.  . Alendronate Sodium Other (See Comments)    Whole body hurt  . Aspirin Hives  . Glucotrol [Glipizide] Other (See Comments)    Migraine "color flashes" in L eye with dizziness and lightheadedness.  . Lipitor [Atorvastatin Calcium] Other (See Comments)    Whole body hurt  . Metformin And Related     Severe abdominal pain   Family History  Problem Relation Age of Onset  . Pancreatitis Father     deceased 65  . Cancer    . Cancer Brother     GI  . Cancer Mother     liver  . Cancer Son     terminal kidney   PE: BP 132/82  Pulse 100  Temp(Src) 97.9 F (36.6 C) (Oral)  Resp 12  Ht 5\' 1"  (1.549 m)  Wt 174 lb 12.8 oz (79.289 kg)  BMI 33.05 kg/m2  SpO2 97% Wt Readings from Last 3 Encounters:  10/09/13 174 lb 12.8 oz (79.289 kg)  09/26/13 172 lb (78.019 kg)  08/15/13 174 lb 3.2 oz (79.017 kg)   Constitutional: overweight, in NAD Eyes: PERRLA, EOMI, no exophthalmos ENT: moist mucous membranes, no thyromegaly, no cervical lymphadenopathy Cardiovascular: RRR, No MRG Respiratory: CTA B Gastrointestinal: abdomen soft, NT, ND, BS+ Musculoskeletal: no deformities, strength intact in all 4 Skin: moist, warm, no rashes Neurological: no tremor with outstretched hands, DTR normal in all 4  ASSESSMENT: 1. DM2, insulin-dependent, uncontrolled, without complications  2. HL  PLAN:  1. Patient with long-standing, uncontrolled diabetes, with multiple DM drugs intolerances, now off all meds. I suggested to retry insulin, this time Lantus and we will likely need to add a small dose of Glipizide XL at next visit to also cover postprandial sugars. Metformin XR is another option if sugars not at goal after starting these. - I suggested to:  Patient Instructions  Please start Lantus 10 units at bedtime for 1 week. If sugars not <150 in am, then increase to 13 units  for another week. If sugars not <15 in am, increase to 15 units at bedtime. Please return in 1 month with your sugar log.  - Strongly advised her to start checking sugars at different times of the day - check 2 times a day, rotating checks - given sugar log and advised how to fill it and to bring it at next appt  - given foot care handout and explained the principles  - given instructions for hypoglycemia management "15-15 rule"  - advised for yearly eye exams - Return to clinic in 1 mo with sugar log   2. HL - we discussed that her LDL is very high  - I  suggested Ezetimibe >> she agrees to this, but I would like her to only start 1 drug at a time, since she has such poor tolerance to meds.  - The LDL levels may also improve with improving her DM.  - diet suggestions also given (see pt instructions)

## 2013-11-11 ENCOUNTER — Ambulatory Visit (INDEPENDENT_AMBULATORY_CARE_PROVIDER_SITE_OTHER): Payer: Medicare Other | Admitting: Internal Medicine

## 2013-11-11 ENCOUNTER — Encounter: Payer: Self-pay | Admitting: Internal Medicine

## 2013-11-11 VITALS — BP 124/68 | HR 93 | Temp 98.2°F | Resp 12 | Wt 172.0 lb

## 2013-11-11 DIAGNOSIS — IMO0001 Reserved for inherently not codable concepts without codable children: Secondary | ICD-10-CM

## 2013-11-11 DIAGNOSIS — E785 Hyperlipidemia, unspecified: Secondary | ICD-10-CM | POA: Diagnosis not present

## 2013-11-11 DIAGNOSIS — E1165 Type 2 diabetes mellitus with hyperglycemia: Secondary | ICD-10-CM | POA: Diagnosis not present

## 2013-11-11 MED ORDER — INSULIN GLARGINE 100 UNIT/ML SOLOSTAR PEN: 20.0000 [IU] | PEN_INJECTOR | Freq: Every day | SUBCUTANEOUS | Status: DC

## 2013-11-11 NOTE — Progress Notes (Signed)
Patient ID: Wanda [name], female   DOB: 1940/04/18, 73 y.o.   MRN: [id number]  HPI: Yaneliz Radebaugh is a 73 y.o.-year-old female, returning for f/u for DM2, dx 2007, insulin-dependent since 07/2013, uncontrolled, without complications and also HL, intolerant to statins. Last visit 1 mo ago.  She started Levemir in 07/2013. She since stopped it (on 09/17/2013) as she had lower leg bone pain, blurred vision, mental fog. We started Lantus at last visit.  Last hemoglobin A1c was: Lab Results  Component Value Date   HGBA1C 9.6 09/26/2013   HGBA1C 12.0 07/30/2013   HGBA1C 8.6* 02/28/2013   Pt is on: - Lantus 10 units in hs She tried Metformin >> abdominal pain She tries Glymepiride >> stopped working She tries Glipizide >> dizziness  Pt checks her sugars 2-3 a day and they are: - am: 113, 146, 220-314 >> 153, 191-241, 310 - 2h after b'fast: 240, 253 >> 222 - before lunch: 170-283 >> 208-262, 321 - 2h after lunch: 172-271, 425  ([employer]) >> n/v - before dinner: 241, 320, 372 >> 196-233 - 2h after dinner: 237, 270 >> n/c - bedtime: 199-338, 1x 518 - nighttime: No lows. Lowest sugar was 153; ? If has hypoglycemia awareness. Highest sugar was 400s x1.  Pt's meals are: - Breakfast: yoghurt + fruit; eggs + onion, peppers - Lunch: salad & sardines - Dinner: meat + veggies - Snacks: 2-3: fruit, cheese, nuts  - no CKD, last BUN/creatinine:  Lab Results  Component Value Date   BUN 16 07/30/2013   CREATININE 0.85 07/30/2013   - last set of lipids: Lab Results  Component Value Date   CHOL 277* 07/30/2013   HDL 50 07/30/2013   LDLCALC 191* 07/30/2013   TRIG 178* 07/30/2013   CHOLHDL 5.5 07/30/2013  She started statins >> "muscles frozen". She tries Fish oil. I suggested Zetia at last visit, but she preferred to start only one new med at a time (Lantus). - last eye exam was in 08/2013. No DR.  - no numbness and tingling in her feet. She has a podiatrist - last foot exam  09/2013..  Of note, Sister and father died of pancreatitis.  I reviewed pt's medications, allergies, PMH, social hx, family hx and no changes required, except as mentioned above.  ROS: Constitutional: no weight gain/loss, no fatigue, no subjective hyperthermia/hypothermia, + poor sleep Eyes: no blurry vision, no xerophthalmia ENT: no sore throat, no nodules palpated in throat, no dysphagia/odynophagia, no hoarseness Cardiovascular: no CP/SOB/palpitations/leg swelling Respiratory: no cough/SOB Gastrointestinal: no N/V/D/C Musculoskeletal: no muscle/+ joint aches (knees) Skin: no rashes Neurological: no tremors/numbness/tingling/dizziness  PE: BP 124/68 mmHg  Pulse 93  Temp(Src) 98.2 F (36.8 C) (Oral)  Resp 12  Wt 172 lb (78.019 kg)  SpO2 97% Wt Readings from Last 3 Encounters:  11/11/13 172 lb (78.019 kg)  10/09/13 174 lb 12.8 oz (79.289 kg)  09/26/13 172 lb (78.019 kg)   Constitutional: overweight, in NAD Eyes: PERRLA, EOMI, no exophthalmos ENT: moist mucous membranes, + mild B thyromegaly, no cervical lymphadenopathy Cardiovascular: RRR, No MRG Respiratory: CTA B Gastrointestinal: abdomen soft, NT, ND, BS+ Musculoskeletal: no deformities, strength intact in all 4 Skin: moist, warm, no rashes Neurological: no tremor with outstretched hands, DTR normal in all 4  ASSESSMENT: 1. DM2, insulin-dependent, uncontrolled, without complications  2. HL  PLAN:  1. Patient with long-standing, uncontrolled diabetes (reviewed he HbA1c levels), with multiple DM drugs intolerances, now only on Lantus insulin, which we started at last visit  and which she tolerates well. We will likely need to add a small dose of Glipizide XL at next visit or before that, to also cover postprandial sugars. I advised her to increase her insulin dose and call me in 2 weeks to see if we ned to add the Glipizide XL - I suggested to:  Patient Instructions  Please increase Lantus by 5 units >> 20 units at  bedtime. Keep increasing by 5 units every 3 days until you get to ~30-35 units. Let me know in ~2 weeks if sugars are not better >> we may need to start Glipizide XL 5 mg daily at that point.  Please return in 1-1.5 months with your sugar log.    - continue checking sugars at different times of the day - check 2 times a day, rotating checks - advised for yearly eye exams >> she is up to date - refuses flu vaccine  - Return to clinic in 1 mo with sugar log   2. HL - we again discussed that her LDL is very high  - I suggested Ezetimibe >> she agrees to this, but she wants to first get better diabetes control  - we did discuss consequences of a high LDL >> cardio-vascular and cerebro-vascular ds., etc.

## 2013-11-11 NOTE — Patient Instructions (Signed)
Please increase Lantus by 5 units >> 20 units at bedtime. Keep increasing by 5 units every 3 days until you get to ~30-35 units. Let me know in ~2 weeks if sugars are not better >> we may need to start Glipizide XL 5 mg daily at that point.  Please return in 1-1.5 months with your sugar log.

## 2013-12-19 DIAGNOSIS — R921 Mammographic calcification found on diagnostic imaging of breast: Secondary | ICD-10-CM | POA: Diagnosis not present

## 2013-12-19 DIAGNOSIS — Z803 Family history of malignant neoplasm of breast: Secondary | ICD-10-CM | POA: Diagnosis not present

## 2013-12-26 ENCOUNTER — Ambulatory Visit (INDEPENDENT_AMBULATORY_CARE_PROVIDER_SITE_OTHER): Payer: Medicare Other | Admitting: Internal Medicine

## 2013-12-26 ENCOUNTER — Encounter: Payer: Self-pay | Admitting: Internal Medicine

## 2013-12-26 VITALS — BP 126/68 | HR 82 | Temp 98.3°F | Resp 12 | Wt 169.0 lb

## 2013-12-26 DIAGNOSIS — E1165 Type 2 diabetes mellitus with hyperglycemia: Secondary | ICD-10-CM | POA: Diagnosis not present

## 2013-12-26 DIAGNOSIS — IMO0001 Reserved for inherently not codable concepts without codable children: Secondary | ICD-10-CM

## 2013-12-26 DIAGNOSIS — E785 Hyperlipidemia, unspecified: Secondary | ICD-10-CM | POA: Diagnosis not present

## 2013-12-26 LAB — HEMOGLOBIN A1C: HEMOGLOBIN A1C: 9.8 % — AB (ref 4.6–6.5)

## 2013-12-26 NOTE — Patient Instructions (Signed)
  Please continue Lantus 20 units at bedtime.  Start Glipizide XL 5 mg in am. If sugars consistently 100 after adding the Glipizide XL, decrease Lantus to 15 units.  Please return in 1.5 months with your sugar log.   Please stop at the lab.

## 2013-12-26 NOTE — Progress Notes (Signed)
Patient ID: Wanda [name], female   DOB: 24-Jun-1940, 73 y.o.   MRN: [id number]  HPI: Wanda [name] is a 73 y.o.-year-old female, returning for f/u for DM2, dx 2007, insulin-dependent since 07/2013, uncontrolled, without complications and also HL, intolerant to statins. Last visit 1.5 mo ago.  Last hemoglobin A1c was: Lab Results  Component Value Date   HGBA1C 9.6 09/26/2013   HGBA1C 12.0 07/30/2013   HGBA1C 8.6* 02/28/2013   Pt is on: - Lantus 10 >> 20 units in hs  She tried Metformin >> abdominal pain She tried Glymepiride >> stopped working She tried Glipizide >> dizziness She is afraid of Januvia b/c of possible pancreatic issues She started Levemir in 07/2013. She since stopped it (on 09/17/2013) as she had lower leg bone pain, blurred vision, mental fog.   Pt checks her sugars 2-3 a day and they are: - am: 113, 146, 220-314 >> 153, 191-241, 310 >> 85, 112-160, 180 - 2h after b'fast: 240, 253 >> 222 >> 176, 221 - before lunch: 170-283 >> 208-262, 321 >> 141-184 - 2h after lunch: 172-271, 425  ([employer]) >> n/c >> 180, 275 - before dinner: 241, 320, 372 >> 196-233 >> 153-213, 293 - 2h after dinner: 237, 270 >> n/c >> 227 - bedtime: 199-338, 1x 518 >> 250, 256 - nighttime: No lows. Lowest sugar was 153 >> 85; ? If has hypoglycemia awareness. Highest sugar was 400s x1 >> 293  Pt's meals are: - Breakfast: yoghurt + fruit; eggs + onion, peppers - Lunch: salad & sardines - Dinner: meat + veggies - Snacks: 2-3: fruit, cheese, nuts  - no CKD, last BUN/creatinine:  Lab Results  Component Value Date   BUN 16 07/30/2013   CREATININE 0.85 07/30/2013   - last set of lipids: Lab Results  Component Value Date   CHOL 277* 07/30/2013   HDL 50 07/30/2013   LDLCALC 191* 07/30/2013   TRIG 178* 07/30/2013   CHOLHDL 5.5 07/30/2013  She started statins >> "muscles frozen". She tries Fish oil. I suggested Zetia at last visit, but she preferred to start only one new med at a time  (Lantus). - last eye exam was in 08/2013. No DR.  - no numbness and tingling in her feet. She has a podiatrist - last foot exam 09/2013..  Of note, Sister and father died of pancreatitis.   I reviewed pt's medications, allergies, PMH, social hx, family hx and no changes required, except as mentioned above.  ROS: Constitutional: no weight gain/loss, no fatigue, no subjective hyperthermia/hypothermia Eyes: no blurry vision, no xerophthalmia ENT: no sore throat, no nodules palpated in throat, no dysphagia/odynophagia, no hoarseness Cardiovascular: no CP/SOB/palpitations/leg swelling Respiratory: no cough/SOB Gastrointestinal: no N/V/D/C Musculoskeletal:+ muscle/+ joint aches (knees) Skin: no rashes Neurological: no tremors/numbness/tingling/dizziness  PE: BP 126/68 mmHg  Pulse 82  Temp(Src) 98.3 F (36.8 C) (Oral)  Resp 12  Wt 169 lb (76.658 kg)  SpO2 97% Body mass index is 31.95 kg/(m^2). Wt Readings from Last 3 Encounters:  12/26/13 169 lb (76.658 kg)  11/11/13 172 lb (78.019 kg)  10/09/13 174 lb 12.8 oz (79.289 kg)   Constitutional: overweight, in NAD Eyes: PERRLA, EOMI, no exophthalmos ENT: moist mucous membranes, + mild B thyromegaly, no cervical lymphadenopathy Cardiovascular: RRR, No MRG Respiratory: CTA B Gastrointestinal: abdomen soft, NT, ND, BS+ Musculoskeletal: no deformities, strength intact in all 4 Skin: moist, warm, no rashes Neurological: no tremor with outstretched hands, DTR normal in all 4  ASSESSMENT: 1. DM2, insulin-dependent, uncontrolled,  without complications  2. HL  PLAN:  1. Patient with long-standing, uncontrolled diabetes (reviewed he HbA1c levels), with multiple DM drugs intolerances, now only on Lantus insulin, which she tolerates well (intolerant to Levemir). She has a staircase effect of her sugars - increasing as the day goes by >> we will add a small dose of Glipizide XL for mealtime coverage.  - I suggested to:  Patient Instructions      Please continue Lantus 20 units at bedtime.  Start Glipizide XL 5 mg in am. If sugars consistently 100 after adding the Glipizide XL, decrease Lantus to 15 units.  Please return in 1.5 months with your sugar log.   Please stop at the lab.  - continue checking sugars at different times of the day - check 2 times a day, rotating checks - advised for yearly eye exams >> she is up to date - refuses flu vaccine  - check HbA1c today - Return to clinic in 1.5 mo with sugar log   2. HL - we again discussed that her LDL is very high  - I again suggested Ezetimibe >> she agrees to this, but she wants to first get better diabetes control, especially does not want to start 2 new drugs at the same time.  Office Visit on 12/26/2013  Component Date Value Ref Range Status  . Hgb A1c MFr Bld 12/26/2013 9.8* 4.6 - 6.5 % Final   Glycemic Control Guidelines for People with Diabetes:Non Diabetic:  <6%Goal of Therapy: <7%Additional Action Suggested:  >8%   HbA1c a little higher, but the next one will be more relevant. See med changes above.

## 2013-12-29 ENCOUNTER — Encounter: Payer: Self-pay | Admitting: Family Medicine

## 2013-12-30 ENCOUNTER — Telehealth: Payer: Self-pay | Admitting: Internal Medicine

## 2013-12-30 MED ORDER — GLIPIZIDE ER 5 MG PO TB24: 5.0000 mg | ORAL_TABLET | Freq: Every day | ORAL | Status: DC

## 2013-12-30 NOTE — Telephone Encounter (Signed)
Sent to pts pharmacy 

## 2013-12-30 NOTE — Telephone Encounter (Signed)
Patient called stating that Dr. Cruzita Lederer was to start her on the glipizide XL and it was never called in to her pharmacy    Pharmacy: [facility]   Please advise    Thank you

## 2014-01-16 DIAGNOSIS — D485 Neoplasm of uncertain behavior of skin: Secondary | ICD-10-CM | POA: Diagnosis not present

## 2014-01-16 DIAGNOSIS — L814 Other melanin hyperpigmentation: Secondary | ICD-10-CM | POA: Diagnosis not present

## 2014-01-16 DIAGNOSIS — L821 Other seborrheic keratosis: Secondary | ICD-10-CM | POA: Diagnosis not present

## 2014-01-16 DIAGNOSIS — D225 Melanocytic nevi of trunk: Secondary | ICD-10-CM | POA: Diagnosis not present

## 2014-02-06 ENCOUNTER — Ambulatory Visit (INDEPENDENT_AMBULATORY_CARE_PROVIDER_SITE_OTHER): Payer: Medicare Other | Admitting: Internal Medicine

## 2014-02-06 ENCOUNTER — Encounter: Payer: Self-pay | Admitting: Internal Medicine

## 2014-02-06 VITALS — BP 144/84 | HR 93 | Temp 97.3°F | Resp 12 | Wt 172.0 lb

## 2014-02-06 DIAGNOSIS — E785 Hyperlipidemia, unspecified: Secondary | ICD-10-CM | POA: Diagnosis not present

## 2014-02-06 DIAGNOSIS — E1165 Type 2 diabetes mellitus with hyperglycemia: Secondary | ICD-10-CM | POA: Diagnosis not present

## 2014-02-06 DIAGNOSIS — IMO0001 Reserved for inherently not codable concepts without codable children: Secondary | ICD-10-CM

## 2014-02-06 NOTE — Patient Instructions (Signed)
Please increase Lantus to 17 units at bedtime.  Continue Glipizide XL 5 mg in am.  Please return on 03/31/2014 at 8 am with your sugar log.

## 2014-02-06 NOTE — Progress Notes (Signed)
Patient ID: Wanda [name], female   DOB: December 24, 1940, 74 y.o.   MRN: [id number]  HPI: Wanda [name] is a 74 y.o.-year-old female, returning for f/u for DM2, dx 2007, insulin-dependent since 07/2013, uncontrolled, without complications and also HL, intolerant to statins. Last visit 1.5 mo ago.  Last hemoglobin A1c was: Lab Results  Component Value Date   HGBA1C 9.8* 12/26/2013   HGBA1C 9.6 09/26/2013   HGBA1C 12.0 07/30/2013  She has Shingles in 03/2013.  Pt is on: - Lantus 10 >> 20 >> 15 units in hs  - Glipizide XL 5 mg in am. She tried Metformin >> abdominal pain She tried Glymepiride >> stopped working She tried regular Glipizide >> dizziness She is afraid of Januvia b/c of possible pancreatic issues She started Levemir in 07/2013. She since stopped it (on 09/17/2013) as she had lower leg bone pain, blurred vision, mental fog.   Pt checks her sugars 2-3 a day and they are better: - am: 113, 146, 220-314 >> 153, 191-241, 310 >> 85, 112-160, 180 >> 93, 128-150 - 2h after b'fast: 240, 253 >> 222 >> 176, 221 >> 172 - before lunch: 170-283 >> 208-262, 321 >> 141-184 >> 108-132 - 2h after lunch: 172-271, 425  ([employer]) >> n/c >> 180, 275 >> n/c - before dinner: 241, 320, 372 >> 196-233 >> 153-213, 293 >> 93-140, 164 - 2h after dinner: 237, 270 >> n/c >> 227 >> n/c - bedtime: 199-338, 1x 518 >> 250, 256 >> 140 - nighttime: No lows. Lowest sugar was 153 >> 85 >> 93; ? If has hypoglycemia awareness. Highest sugar was 400s x1 >> 293 >> 200s in Dec.  Pt's meals are: - Breakfast: yoghurt + fruit; eggs + onion, peppers - Lunch: salad & sardines - Dinner: meat + veggies - Snacks: 2-3: fruit, cheese, nuts  - no CKD, last BUN/creatinine:  Lab Results  Component Value Date   BUN 16 07/30/2013   CREATININE 0.85 07/30/2013   - last set of lipids: Lab Results  Component Value Date   CHOL 277* 07/30/2013   HDL 50 07/30/2013   LDLCALC 191* 07/30/2013   TRIG 178* 07/30/2013   CHOLHDL 5.5 07/30/2013  She started statins >> "muscles frozen". She tries Fish oil. I suggested Zetia at last visits, but she did not want to start yet. - last eye exam was in 08/2013. No DR.  - no numbness and tingling in her feet. She has a podiatrist - last foot exam 09/2013.  Of note, Sister and father died of pancreatitis.   I reviewed pt's medications, allergies, PMH, social hx, family hx, and changes were documented in the history of present illness. Otherwise, unchanged from my initial visit note.  ROS: Constitutional: no weight gain/loss, no fatigue, no subjective hyperthermia/hypothermia Eyes: no blurry vision, no xerophthalmia ENT: no sore throat, no nodules palpated in throat, no dysphagia/odynophagia, no hoarseness Cardiovascular: no CP/SOB/palpitations/leg swelling Respiratory: no cough/SOB Gastrointestinal: no N/V/+ controllable D/no C Musculoskeletal:no muscle pain /+ joint aches (knees) Skin: no rashes Neurological: no tremors/numbness/tingling/dizziness  PE: BP 144/84 mmHg  Pulse 93  Temp(Src) 97.3 F (36.3 C) (Oral)  Resp 12  Wt 172 lb (78.019 kg)  SpO2 98% Body mass index is 32.52 kg/(m^2). Wt Readings from Last 3 Encounters:  02/06/14 172 lb (78.019 kg)  12/26/13 169 lb (76.658 kg)  11/11/13 172 lb (78.019 kg)   Constitutional: overweight, in NAD Eyes: PERRLA, EOMI, no exophthalmos ENT: moist mucous membranes, + mild B thyromegaly, no cervical  lymphadenopathy Cardiovascular: RRR, No MRG Respiratory: CTA B Gastrointestinal: abdomen soft, NT, ND, BS+ Musculoskeletal: no deformities, strength intact in all 4 Skin: moist, warm, no rashes Neurological: no tremor with outstretched hands, DTR normal in all 4  ASSESSMENT: 1. DM2, insulin-dependent, uncontrolled, without complications  2. HL  PLAN:  1. Patient with long-standing, uncontrolled diabetes (reviewed the HbA1c levels along with her), with multiple DM drugs intolerances, now with much better  control after adding Lantus and then Glipizide XL 5 mg. Sugars are better but not quite at goal >> will increase Lantus by 2U. - I suggested to:  Patient Instructions  Please increase Lantus to 17 units at bedtime.  Continue Glipizide XL 5 mg in am.  Please return on 03/31/2014 at 8 am with your sugar log.   - continue checking sugars at different times of the day - check 2 times a day, rotating checks - advised for yearly eye exams >> she is up to date - refused flu vaccine  - check HbA1c at next visit - Return to clinic in 1.5 mo with sugar log   2. HL - we again discussed that her LDL is very high  - I again suggested Ezetimibe >> she prefers to check Lipids at next visit (will come fasting) and decide then

## 2014-03-31 ENCOUNTER — Encounter: Payer: Self-pay | Admitting: Internal Medicine

## 2014-03-31 ENCOUNTER — Ambulatory Visit (INDEPENDENT_AMBULATORY_CARE_PROVIDER_SITE_OTHER): Payer: Medicare Other | Admitting: Internal Medicine

## 2014-03-31 VITALS — BP 118/68 | HR 77 | Temp 97.6°F | Resp 12 | Wt 173.0 lb

## 2014-03-31 DIAGNOSIS — E010 Iodine-deficiency related diffuse (endemic) goiter: Secondary | ICD-10-CM

## 2014-03-31 DIAGNOSIS — IMO0001 Reserved for inherently not codable concepts without codable children: Secondary | ICD-10-CM

## 2014-03-31 DIAGNOSIS — E1165 Type 2 diabetes mellitus with hyperglycemia: Secondary | ICD-10-CM

## 2014-03-31 DIAGNOSIS — E785 Hyperlipidemia, unspecified: Secondary | ICD-10-CM | POA: Diagnosis not present

## 2014-03-31 DIAGNOSIS — E049 Nontoxic goiter, unspecified: Secondary | ICD-10-CM

## 2014-03-31 LAB — T3, FREE: T3 FREE: 2.8 pg/mL (ref 2.3–4.2)

## 2014-03-31 LAB — LIPID PANEL
Cholesterol: 234 mg/dL — ABNORMAL HIGH (ref 0–200)
HDL: 47.3 mg/dL (ref >39.00)
LDL CALC: 166 mg/dL — AB (ref 0–99)
NONHDL: 186.7
Total CHOL/HDL Ratio: 5
Triglycerides: 103 mg/dL (ref 0.0–149.0)
VLDL: 20.6 mg/dL (ref 0.0–40.0)

## 2014-03-31 LAB — COMPREHENSIVE METABOLIC PANEL
ALBUMIN: 4.2 g/dL (ref 3.5–5.2)
ALT: 17 U/L (ref 0–35)
AST: 16 U/L (ref 0–37)
Alkaline Phosphatase: 45 U/L (ref 39–117)
BUN: 18 mg/dL (ref 6–23)
CHLORIDE: 108 meq/L (ref 96–112)
CO2: 25 meq/L (ref 19–32)
Calcium: 9.9 mg/dL (ref 8.4–10.5)
Creatinine, Ser: 0.91 mg/dL (ref 0.40–1.20)
GFR: 64.19 mL/min (ref >60.00)
GLUCOSE: 155 mg/dL — AB (ref 70–99)
POTASSIUM: 4.2 meq/L (ref 3.5–5.1)
SODIUM: 139 meq/L (ref 135–145)
TOTAL PROTEIN: 7.3 g/dL (ref 6.0–8.3)
Total Bilirubin: 0.4 mg/dL (ref 0.2–1.2)

## 2014-03-31 LAB — T4, FREE: FREE T4: 0.81 ng/dL (ref 0.60–1.60)

## 2014-03-31 LAB — HEMOGLOBIN A1C: Hgb A1c MFr Bld: 7.7 % — ABNORMAL HIGH (ref 4.6–6.5)

## 2014-03-31 LAB — TSH: TSH: 1.26 u[IU]/mL (ref 0.35–4.50)

## 2014-03-31 MED ORDER — GLIPIZIDE ER 5 MG PO TB24: 5.0000 mg | ORAL_TABLET | Freq: Every day | ORAL | Status: DC

## 2014-03-31 NOTE — Patient Instructions (Signed)
Please continue Lantus 17 units at bedtime. Continue Glipizide XL 5 mg in am.  You will be called to schedule the thyroid U/S.  Please stop at the lab.  Please return in 3 months with your sugar log.

## 2014-03-31 NOTE — Progress Notes (Signed)
Patient ID: Wanda [name], female   DOB: [birth year], 74 y.o.   MRN: [id number]  HPI: Wanda [name] is a 74 y.o.-year-old female, returning for f/u for DM2, dx 2007, insulin-dependent since 07/2013, uncontrolled, without complications and also HL, intolerant to statins. Last visit 2 mo ago.  Last hemoglobin A1c was: Lab Results  Component Value Date   HGBA1C 9.8* 12/26/2013   HGBA1C 9.6 09/26/2013   HGBA1C 12.0 07/30/2013  She has Shingles in 03/2013.  Pt is on: - Lantus 10 >> 20 >> 15 >> 17 units in hs  - Glipizide XL 5 mg in am. She tried Metformin >> abdominal pain She tried Glymepiride >> stopped working She tried regular Glipizide >> dizziness She is afraid of Januvia b/c of possible pancreatic issues She started Levemir in 07/2013. She since stopped it (on 09/17/2013) as she had lower leg bone pain, blurred vision, mental fog.   Pt checks her sugars 2-3 a day and they are better: - am: 113, 146, 220-314 >> 153, 191-241, 310 >> 85, 112-160, 180 >> 93, 128-150 >> 83, 100-139 - 2h after b'fast: 240, 253 >> 222 >> 176, 221 >> 172 >> 128-154 - before lunch: 170-283 >> 208-262, 321 >> 141-184 >> 108-132 >> 99-123, 147 - 2h after lunch: 172-271, 425  ([employer]) >> n/c >> 180, 275 >> n/c >> 90-158 - before dinner: 241, 320, 372 >> 196-233 >> 153-213, 293 >> 93-140, 164 >> 90-124, 180 - 2h after dinner: 237, 270 >> n/c >> 227 >> n/c  - bedtime: 199-338, 1x 518 >> 250, 256 >> 140 >> 167, 190 - nighttime: No lows. Lowest sugar was 153 >> 85 >> 93; ? If has hypoglycemia awareness. Highest sugar was 400s x1 >> 293 >> 200s in Dec.  Pt's meals are: - Breakfast: yoghurt + fruit; eggs + onion, peppers - Lunch: salad & sardines - Dinner: meat + veggies - Snacks: 2-3: fruit, cheese, nuts  - no CKD, last BUN/creatinine:  Lab Results  Component Value Date   BUN 16 07/30/2013   CREATININE 0.85 07/30/2013   - last set of lipids: Lab Results  Component Value Date   CHOL 277*  07/30/2013   HDL 50 07/30/2013   LDLCALC 191* 07/30/2013   TRIG 178* 07/30/2013   CHOLHDL 5.5 07/30/2013  She started statins >> "muscles frozen". She tries Fish oil. I suggested Zetia at last visits, but she did not want to start yet. - last eye exam was in 08/2013. No DR. + cataracts stage 1. - no numbness and tingling in her feet. She has a podiatrist - last foot exam 09/2013.  Of note, Sister and father died of pancreatitis. Mother died of liver CA (alcoholic).  She has a h/o goiter and thyroid nodules, previously investigated before moving to [place] with sequential U/S's. No neck compression sxs.   I reviewed pt's medications, allergies, PMH, social hx, family hx, and changes were documented in the history of present illness. Otherwise, unchanged from my initial visit note.  ROS: Constitutional: no weight gain/loss, no fatigue, no subjective hyperthermia/hypothermia Eyes: no blurry vision, no xerophthalmia ENT: no sore throat, no nodules palpated in throat, no dysphagia/odynophagia, no hoarseness Cardiovascular: no CP/SOB/palpitations/leg swelling Respiratory: no cough/SOB Gastrointestinal: no N/V/D/C Musculoskeletal:no muscle pain /+ joint aches (knees) Skin: no rashes Neurological: no tremors/numbness/tingling/dizziness  PE: BP 118/68 mmHg  Pulse 77  Temp(Src) 97.6 F (36.4 C) (Oral)  Resp 12  Wt 173 lb (78.472 kg)  SpO2 95% Body mass index  is 32.7 kg/(m^2). Wt Readings from Last 3 Encounters:  03/31/14 173 lb (78.472 kg)  02/06/14 172 lb (78.019 kg)  12/26/13 169 lb (76.658 kg)   Constitutional: overweight, in NAD Eyes: PERRLA, EOMI, no exophthalmos ENT: moist mucous membranes, + B thyromegaly, no cervical lymphadenopathy Cardiovascular: RRR, No MRG Respiratory: CTA B Gastrointestinal: abdomen soft, NT, ND, BS+ Musculoskeletal: no deformities, strength intact in all 4 Skin: moist, warm, no rashes Neurological: no tremor with outstretched hands, DTR normal in all  4  ASSESSMENT: 1. DM2, insulin-dependent, uncontrolled, without complications  2. HL  3. Thyromegaly - bilaterally - daughter with thyroid ds. (? Type) - No FH of thyroid CA - FH of goiter on mother's side  PLAN:  1. Patient with long-standing, uncontrolled diabetes, with multiple DM drugs intolerances, now with much better control after adding Lantus and then Glipizide XL 5 mg. Sugars are at goal except later in the day, but not many checks then >> will continue current regimen for now. - I suggested to:  Patient Instructions  Please continue Lantus 17 units at bedtime. Continue Glipizide XL 5 mg in am.  You will be called to schedule the thyroid U/S.  Please stop at the lab.  Please return in 3 months with your sugar log.   - continue checking sugars at different times of the day - check 2 times a day, rotating checks - advised for yearly eye exams >> she is up to date - check HbA1c today - refilled Glipizide XL - Return to clinic in 3 mo with sugar log   2. HL - we again discussed that her LDL is very high  - I again suggested Ezetimibe, but will check Lipids first (she is fasting).  - she agrees to start this, if needed  3. Thyromegaly - long h/o goiter; no compression sxs - no h/o Bx's - no FH of ThyCA - will check TFTs - will check a thyroid U/S   Office Visit on 03/31/2014  Component Date Value Ref Range Status  . Hgb A1c MFr Bld 03/31/2014 7.7* 4.6 - 6.5 % Final   Glycemic Control Guidelines for People with Diabetes:Non Diabetic:  <6%Goal of Therapy: <7%Additional Action Suggested:  >8%   . Cholesterol 03/31/2014 234* 0 - 200 mg/dL Final   ATP III Classification       Desirable:  < 200 mg/dL               Borderline High:  200 - 239 mg/dL          High:  > = 240 mg/dL  . Triglycerides 03/31/2014 103.0  0.0 - 149.0 mg/dL Final   Normal:  <150 mg/dLBorderline High:  150 - 199 mg/dL  . HDL 03/31/2014 47.30  >39.00 mg/dL Final  . VLDL 03/31/2014 20.6  0.0 -  40.0 mg/dL Final  . LDL Cholesterol 03/31/2014 166* 0 - 99 mg/dL Final  . Total CHOL/HDL Ratio 03/31/2014 5   Final                  Men          Women1/2 Average Risk     3.4          3.3Average Risk          5.0          4.42X Average Risk          9.6          7.13X Average Risk  15.0          11.0                      . NonHDL 03/31/2014 186.70   Final   NOTE:  Non-HDL goal should be 30 mg/dL higher than patient's LDL goal (i.e. LDL goal of < 70 mg/dL, would have non-HDL goal of < 100 mg/dL)  . Sodium 03/31/2014 139  135 - 145 mEq/L Final  . Potassium 03/31/2014 4.2  3.5 - 5.1 mEq/L Final  . Chloride 03/31/2014 108  96 - 112 mEq/L Final  . CO2 03/31/2014 25  19 - 32 mEq/L Final  . Glucose, Bld 03/31/2014 155* 70 - 99 mg/dL Final  . BUN 03/31/2014 18  6 - 23 mg/dL Final  . Creatinine, Ser 03/31/2014 0.91  0.40 - 1.20 mg/dL Final  . Total Bilirubin 03/31/2014 0.4  0.2 - 1.2 mg/dL Final  . Alkaline Phosphatase 03/31/2014 45  39 - 117 U/L Final  . AST 03/31/2014 16  0 - 37 U/L Final  . ALT 03/31/2014 17  0 - 35 U/L Final  . Total Protein 03/31/2014 7.3  6.0 - 8.3 g/dL Final  . Albumin 03/31/2014 4.2  3.5 - 5.2 g/dL Final  . Calcium 03/31/2014 9.9  8.4 - 10.5 mg/dL Final  . GFR 03/31/2014 64.19  >60.00 mL/min Final  . TSH 03/31/2014 1.26  0.35 - 4.50 uIU/mL Final  . Free T4 03/31/2014 0.81  0.60 - 1.60 ng/dL Final  . T3, Free 03/31/2014 2.8  2.3 - 4.2 pg/mL Final   Hemoglobin A1c improved. Thyroid tests normal. LFTs normal. Lipids improved, but LDL still high. I still suggested ezetimibe. I will discuss with patient.  CLINICAL DATA: 74 year old female with a history of thyromegaly.  EXAM: THYROID ULTRASOUND  TECHNIQUE: Ultrasound examination of the thyroid gland and adjacent soft tissues was performed.  COMPARISON: None.  FINDINGS: Right thyroid lobe  Measurements: 4.6 cm x 1.9 cm x 1.5 cm. Several right-sided nodules, all of which are less than 1  cm.  Superior: 4 mm x 2 mm x 3 mm  Mid: 5 mm x 4 mm x 4 mm  Inferior: 5 mm x 3 mm x 4 mm  Left thyroid lobe  Measurements: 4.7 cm x 1.7 cm x 1.8 cm. Several left-sided thyroid nodules are present.  Dominant: 1.1 cm x 6 mm x 5 mm. This lesion is anechoic with posterior acoustic enhancement, compatible with cystic changes.  Mid: 8 mm x 5 mm x 6 mm  Mid: 5 mm x 3 mm x 4 mm.  Inferior: 1.0 cm x 8 mm by 6 mm. Inferior nodule is uniformly echoic with no calcifications.  Isthmus  Thickness: 1.5 cm. Dominant nodule of the thyroid is within the isthmus. This measures 2.3 cm x 1.5 cm x 2.9 cm with complex solid features.  Lymphadenopathy  None visualized.  IMPRESSION: Multinodular thyroid. Single nodule within the isthmus meets criteria for biopsy.  Ultrasound-guided fine needle aspiration should be considered, as per the consensus statement: Management of Thyroid Nodules Detected at [place]: Society of Radiologists in [place]. Radiology 2005; [id number].  Signed,  Dulcy Fanny. Earleen Newport, DO  [facility]  [facility]   Electronically Signed By: Corrie Mckusick D.O. On: 04/03/2014 14:58             Will suggest FNA of the isthmic nodule.  Pt agrees >> ordered FNA.  I will addend the results  when they become available.

## 2014-04-03 ENCOUNTER — Ambulatory Visit
Admission: RE | Admit: 2014-04-03 | Discharge: 2014-04-03 | Disposition: A | Discharge status: Home / Self Care | Payer: Medicare Other | Source: Ambulatory Visit | Attending: Internal Medicine | Admitting: Internal Medicine

## 2014-04-03 DIAGNOSIS — E042 Nontoxic multinodular goiter: Secondary | ICD-10-CM | POA: Diagnosis not present

## 2014-04-07 ENCOUNTER — Encounter: Payer: Self-pay | Admitting: Internal Medicine

## 2014-04-07 ENCOUNTER — Other Ambulatory Visit: Payer: Self-pay | Admitting: Internal Medicine

## 2014-04-07 DIAGNOSIS — E042 Nontoxic multinodular goiter: Secondary | ICD-10-CM | POA: Insufficient documentation

## 2014-04-08 NOTE — Telephone Encounter (Signed)
Olin Hauser from [facility], would like to know if it is the left or the right for the biopsy? [phone]

## 2014-04-16 ENCOUNTER — Inpatient Hospital Stay: Admission: RE | Admit: 2014-04-16 | Payer: Self-pay | Source: Ambulatory Visit

## 2014-06-18 ENCOUNTER — Encounter: Payer: Self-pay | Admitting: *Deleted

## 2014-06-30 ENCOUNTER — Other Ambulatory Visit: Payer: Self-pay | Admitting: *Deleted

## 2014-06-30 DIAGNOSIS — R921 Mammographic calcification found on diagnostic imaging of breast: Secondary | ICD-10-CM | POA: Diagnosis not present

## 2014-06-30 DIAGNOSIS — N649 Disorder of breast, unspecified: Secondary | ICD-10-CM

## 2014-06-30 DIAGNOSIS — L988 Other specified disorders of the skin and subcutaneous tissue: Secondary | ICD-10-CM

## 2014-06-30 LAB — HM MAMMOGRAPHY

## 2014-07-16 ENCOUNTER — Other Ambulatory Visit (INDEPENDENT_AMBULATORY_CARE_PROVIDER_SITE_OTHER): Payer: Medicare Other | Admitting: *Deleted

## 2014-07-16 ENCOUNTER — Encounter: Payer: Self-pay | Admitting: Internal Medicine

## 2014-07-16 ENCOUNTER — Ambulatory Visit (INDEPENDENT_AMBULATORY_CARE_PROVIDER_SITE_OTHER): Payer: Medicare Other | Admitting: Internal Medicine

## 2014-07-16 VITALS — BP 124/68 | HR 78 | Temp 98.2°F | Resp 12 | Wt 175.6 lb

## 2014-07-16 DIAGNOSIS — E785 Hyperlipidemia, unspecified: Secondary | ICD-10-CM

## 2014-07-16 DIAGNOSIS — E042 Nontoxic multinodular goiter: Secondary | ICD-10-CM | POA: Diagnosis not present

## 2014-07-16 DIAGNOSIS — E1165 Type 2 diabetes mellitus with hyperglycemia: Secondary | ICD-10-CM

## 2014-07-16 DIAGNOSIS — IMO0001 Reserved for inherently not codable concepts without codable children: Secondary | ICD-10-CM

## 2014-07-16 LAB — POCT GLYCOSYLATED HEMOGLOBIN (HGB A1C): HEMOGLOBIN A1C: 7.5

## 2014-07-16 NOTE — Progress Notes (Addendum)
Patient ID: Wanda [name], female   DOB: 1940/06/03, 74 y.o.   MRN: [id number]  HPI: Wanda [name] is a 74 y.o.-year-old female, returning for f/u for DM2, dx 2007, insulin-dependent since 07/2013, uncontrolled, without complications and also HL, intolerant to statins. Last visit 3 mo ago.  Since last visit, she had the flu, then developed mm aches >> she stopped insulin!  Last hemoglobin A1c was: Lab Results  Component Value Date   HGBA1C 7.7* 03/31/2014   HGBA1C 9.8* 12/26/2013   HGBA1C 9.6 09/26/2013  She has Shingles in 03/2013.  Pt is on: - Lantus 10 >> 20 >> 15 >> 17 units in hs >> now off! - Glipizide XL 5 mg in am. She tried Metformin >> abdominal pain She tried Glymepiride >> stopped working She tried regular Glipizide >> dizziness She is afraid of Januvia b/c of possible pancreatic issues She started Levemir in 07/2013. She since stopped it (on 09/17/2013) as she had lower leg bone pain, blurred vision, mental fog.   Pt checks her sugars 2-3 a day and they are higher after stopping insulin: - am: 113, 146, 220-314 >> 153, 191-241, 310 >> 85, 112-160, 180 >> 93, 128-150 >> 83, 100-139 >> 93-145, now 174-193 - 2h after b'fast: 240, 253 >> 222 >> 176, 221 >> 172 >> 128-154 >> 101-149, 194 - before lunch: 170-283 >> 208-262, 321 >> 141-184 >> 108-132 >> 99-123, 147 >> 97-137, 160 - 2h after lunch: 172-271, 425  ([employer]) >> n/c >> 180, 275 >> n/c >> 90-158 >> n/c - before dinner: 241, 320, 372 >> 196-233 >> 153-213, 293 >> 93-140, 164 >> 90-124, 180 >> 94, 102-159, 194 - 2h after dinner: 237, 270 >> n/c >> 227 >> n/c  - bedtime: 199-338, 1x 518 >> 250, 256 >> 140 >> 167, 190 >> 231 x1 - nighttime: No lows. Lowest sugar was 153 >> 85 >> 93 >> 93; ? If has hypoglycemia awareness. Highest sugar was 400s x1 >> 293 >> 200 >> 200s  Pt's meals are: - Breakfast: yoghurt + fruit; eggs + onion, peppers - Lunch: salad & sardines - Dinner: meat + veggies - Snacks: 2-3: fruit,  cheese, nuts  - no CKD, last BUN/creatinine:  Lab Results  Component Value Date   BUN 18 03/31/2014   CREATININE 0.91 03/31/2014   - last set of lipids: Lab Results  Component Value Date   CHOL 234* 03/31/2014   HDL 47.30 03/31/2014   LDLCALC 166* 03/31/2014   TRIG 103.0 03/31/2014   CHOLHDL 5 03/31/2014  She started statins >> "muscles frozen". She tries Fish oil. I suggested Zetia at last visits, but she did not want to start yet. - last eye exam was in 08/2013. No DR. + cataracts stage 1. - no numbness and tingling in her feet. She has a podiatrist - last foot exam 09/2013.  Of note, Sister and father died of pancreatitis. Mother died of liver CA (alcoholic).  She has a h/o goiter and thyroid nodules, previously investigated before moving to [place] with sequential U/S's. No neck compression sxs.   I reviewed pt's medications, allergies, PMH, social hx, family hx, and changes were documented in the history of present illness. Otherwise, unchanged from my initial visit note.  ROS: Constitutional: no weight gain/loss, no fatigue, no subjective hyperthermia/hypothermia Eyes: no blurry vision, no xerophthalmia ENT: no sore throat, no nodules palpated in throat, no dysphagia/odynophagia, no hoarseness Cardiovascular: no CP/SOB/palpitations/leg swelling Respiratory: no cough/SOB Gastrointestinal: no N/V/D/C Musculoskeletal:+  muscle pain /+ joint aches (knees) Skin: no rashes Neurological: no tremors/numbness/tingling/dizziness  PE: BP 124/68 mmHg  Pulse 78  Temp(Src) 98.2 F (36.8 C) (Oral)  Resp 12  Wt 175 lb 9.6 oz (79.652 kg)  SpO2 95% Body mass index is 33.2 kg/(m^2). Wt Readings from Last 3 Encounters:  07/16/14 175 lb 9.6 oz (79.652 kg)  03/31/14 173 lb (78.472 kg)  02/06/14 172 lb (78.019 kg)   Constitutional: overweight, in NAD Eyes: PERRLA, EOMI, no exophthalmos ENT: moist mucous membranes, + B thyromegaly, no cervical lymphadenopathy Cardiovascular: RRR, No  MRG Respiratory: CTA B Gastrointestinal: abdomen soft, NT, ND, BS+ Musculoskeletal: no deformities, strength intact in all 4 Skin: moist, warm, no rashes Neurological: no tremor with outstretched hands, DTR normal in all 4  ASSESSMENT: 1. DM2, insulin-dependent, uncontrolled, without complications  2. HL  3. Thyromegaly - bilaterally - daughter with thyroid ds. (? Type) - No FH of thyroid CA - FH of goiter on mother's side - Thyroid U/S (04/03/2014): Right thyroid lobe  Measurements: 4.6 cm x 1.9 cm x 1.5 cm. Several right-sided nodules, all of which are less than 1 cm.  Superior: 4 mm x 2 mm x 3 mm  Mid: 5 mm x 4 mm x 4 mm  Inferior: 5 mm x 3 mm x 4 mm  Left thyroid lobe  Measurements: 4.7 cm x 1.7 cm x 1.8 cm. Several left-sided thyroid nodules are present.  Dominant: 1.1 cm x 6 mm x 5 mm. This lesion is anechoic with posterior acoustic enhancement, compatible with cystic changes.  Mid: 8 mm x 5 mm x 6 mm  Mid: 5 mm x 3 mm x 4 mm.  Inferior: 1.0 cm x 8 mm by 6 mm. Inferior nodule is uniformly echoic with no calcifications.  Isthmus  Thickness: 1.5 cm. Dominant nodule of the thyroid is within the isthmus. This measures 2.3 cm x 1.5 cm x 2.9 cm with complex solid features.  Lymphadenopathy  None visualized.  IMPRESSION: Multinodular thyroid. Single nodule within the isthmus meets criteria for biopsy.              PLAN:  1. Patient with long-standing, uncontrolled diabetes, with multiple DM drugs intolerances, now with worse control after stopping Lantus as she believed this was causing her mm pain. I advised her that this is very unlikely >> will restart this. - I suggested to:  Patient Instructions  Please restart Lantus 15 units at bedtime. If sugars in am are not <130 in 4 days, please increase the dose by 2-4 units. Continue Glipizide XL 5 mg in am.  Please return in 3 months with your sugar log.   - continue checking sugars at  different times of the day - check 2 times a day, rotating checks - advised for yearly eye exams >> she is up to date - check HbA1c today >> 7.5% - Return to clinic in 3 mo with sugar log   2. HL - we again discussed that her LDL is very high  - I again suggested Ezetimibe, but she had mm pains after her flu episode, and would not want to start this for now - will recheck Lipids at next visit  3. Thyromegaly and thyroid nodule - long h/o goiter; no compression sxs - no h/o Bx's - no FH of ThyCA - last set of TFTs reviewed >> normal  - we checked a thyroid U/S 03/2014 >> large thyroid nodule in isthmus >> I suggested Bx >> pt  did not have this yet as her car broke down before the appt. Will reorder.  07/28/2014: FNA isthmic nodule: Adequacy Reason Satisfactory For Evaluation. Diagnosis THYROID, ISTHMUS, FINE NEEDLE ASPIRATION (SPECIMEN 1 OF 1, COLLECTED ON 07/28/14): FINDINGS CONSISTENT WITH BENIGN THYROID NODULE ([place] CATEGORY II). Willeen Niece MD Pathologist, Electronic Signature (Case signed 07/29/2014) Specimen Clinical Information Dominant nodule of the thyroid is within the isthmus, This measures 2.3 cm x 1.5 cm x 2.9 cm with complex solid  Benign results, will continue to follow. features

## 2014-07-16 NOTE — Patient Instructions (Signed)
Patient Instructions  Please restart Lantus 15 units at bedtime. If sugars in am are not <130 in 4 days, please increase the dose by 2-4 units. Continue Glipizide XL 5 mg in am.  Please return in 3 months with your sugar log.

## 2014-07-22 ENCOUNTER — Encounter: Payer: Self-pay | Admitting: *Deleted

## 2014-07-28 ENCOUNTER — Ambulatory Visit
Admission: RE | Admit: 2014-07-28 | Discharge: 2014-07-28 | Disposition: A | Discharge status: Home / Self Care | Payer: Medicare Other | Source: Ambulatory Visit | Attending: Internal Medicine | Admitting: Internal Medicine

## 2014-07-28 ENCOUNTER — Other Ambulatory Visit (HOSPITAL_COMMUNITY)
Admission: RE | Admit: 2014-07-28 | Discharge: 2014-07-28 | Disposition: A | Discharge status: Home / Self Care | Payer: Medicare Other | Source: Ambulatory Visit | Attending: Interventional Radiology | Admitting: Interventional Radiology

## 2014-07-28 DIAGNOSIS — E041 Nontoxic single thyroid nodule: Secondary | ICD-10-CM | POA: Diagnosis present

## 2014-07-30 NOTE — Addendum Note (Signed)
Addended by: Philemon Kingdom on: 07/30/2014 08:00 AM   Modules accepted: Level of Service

## 2014-09-15 DIAGNOSIS — H2513 Age-related nuclear cataract, bilateral: Secondary | ICD-10-CM | POA: Diagnosis not present

## 2014-09-15 DIAGNOSIS — D3132 Benign neoplasm of left choroid: Secondary | ICD-10-CM | POA: Diagnosis not present

## 2014-09-15 DIAGNOSIS — E119 Type 2 diabetes mellitus without complications: Secondary | ICD-10-CM | POA: Diagnosis not present

## 2014-09-15 DIAGNOSIS — H40013 Open angle with borderline findings, low risk, bilateral: Secondary | ICD-10-CM | POA: Diagnosis not present

## 2014-09-18 ENCOUNTER — Telehealth: Payer: Self-pay | Admitting: Internal Medicine

## 2014-09-18 MED ORDER — INSULIN PEN NEEDLE 32G X 4 MM MISC: Status: DC

## 2014-09-18 NOTE — Telephone Encounter (Signed)
Pt needs refill on pen needles the doctor that actual gave them to her has retired the pen needle's name is ( Bd ultra fine needles nano50mm times 32g)

## 2014-09-22 ENCOUNTER — Other Ambulatory Visit: Payer: Self-pay | Admitting: *Deleted

## 2014-09-22 ENCOUNTER — Encounter: Payer: Self-pay | Admitting: Internal Medicine

## 2014-09-22 NOTE — Telephone Encounter (Signed)
Opened encounter in error  

## 2014-09-23 ENCOUNTER — Other Ambulatory Visit: Payer: Self-pay | Admitting: *Deleted

## 2014-09-23 MED ORDER — "INSULIN SYRINGE 31G X 5/16"" 0.5 ML MISC": Status: DC

## 2014-09-30 LAB — HM DIABETES EYE EXAM

## 2014-10-07 ENCOUNTER — Ambulatory Visit (INDEPENDENT_AMBULATORY_CARE_PROVIDER_SITE_OTHER): Payer: Medicare Other | Admitting: Family Medicine

## 2014-10-07 ENCOUNTER — Encounter: Payer: Self-pay | Admitting: Family Medicine

## 2014-10-07 ENCOUNTER — Other Ambulatory Visit: Payer: Self-pay | Admitting: Family Medicine

## 2014-10-07 ENCOUNTER — Ambulatory Visit: Payer: Medicare Other

## 2014-10-07 ENCOUNTER — Ambulatory Visit (INDEPENDENT_AMBULATORY_CARE_PROVIDER_SITE_OTHER): Payer: Medicare Other

## 2014-10-07 VITALS — BP 150/85 | HR 78 | Temp 98.1°F | Resp 16 | Wt 179.2 lb

## 2014-10-07 DIAGNOSIS — E119 Type 2 diabetes mellitus without complications: Secondary | ICD-10-CM

## 2014-10-07 DIAGNOSIS — Z658 Other specified problems related to psychosocial circumstances: Secondary | ICD-10-CM

## 2014-10-07 DIAGNOSIS — M170 Bilateral primary osteoarthritis of knee: Secondary | ICD-10-CM | POA: Diagnosis not present

## 2014-10-07 DIAGNOSIS — M25562 Pain in left knee: Secondary | ICD-10-CM

## 2014-10-07 DIAGNOSIS — M25561 Pain in right knee: Secondary | ICD-10-CM

## 2014-10-07 DIAGNOSIS — F439 Reaction to severe stress, unspecified: Secondary | ICD-10-CM

## 2014-10-07 NOTE — Patient Instructions (Addendum)
You do have arthritis in your knees- you may benefit from either surgery or perhaps Synvisc injections. We will get you set up to see orthopedics in November  You might want to discuss victoza or trulicity with Dr. Cruzita Lederer; perhaps one of these would be a good option for you as you seem to have some side effects from insulin.   Take care, it was good to see you today!

## 2014-10-07 NOTE — Progress Notes (Signed)
[facility] [address], [place] [address] [phone]  Date:  10/07/2014   Name:  Wanda [name]   DOB:  07-15-1940   MRN:  [id number]  PCP:  Wanda Lennox, MD    Chief Complaint: Knee Pain and establish care   History of Present Illness:  Wanda [name] is a 74 y.o. very pleasant female patient who presents with the following:  History of DM, HTN Here today to establish care with me as her old PCP has retired.   She also has c/o knee pain which she has had for over 20 years.   She thinks that she has injured her knees in the past by overusing them.   She did have an MRI of the left knee about 10 years ago while in [place]- she had a torn meniscus.  Elected for not treatment at that time.  However her knees are bothering her more and are holding her back from activity.  She would be interested in further evaluation at this time. She has used steroid injections in the past and does not wish to have any more- her glucose seemed to get out of control  She sees Wanda [name] for her DM control.  She is on lantus and glipizide.  She does seem to notice some SE with lantus- feels like it makes her shaky sometimes.    Her son has been ill - sadly he had been battling metastatic renal cancer for about 8 years. He did well for a long time but is now running out of options and will likely die of his cancer in the next few years.  Wanda [name] feels like she is handing this as well as can be expected.   She has 3 children, 8 grands and ?6 great- grand children.   She does use tylenol as needed for her knee pain, does not want anything stronger for her knee pain  BP Readings from Last 3 Encounters:  10/07/14 170/84  07/16/14 124/68  03/31/14 118/68    Lab Results  Component Value Date   HGBA1C 7.5 07/16/2014     Patient Active Problem List   Diagnosis Date Noted  . Multinodular goiter (nontoxic) 04/07/2014  . Diabetes mellitus type 2, uncontrolled, without  complications 53/66/4403  . Insulin adverse reaction 09/27/2013  . Senile nuclear sclerosis 08/14/2012  . HTN, goal below 130/80 03/10/2011  . Hyperlipidemia with target LDL less than 70 03/10/2011  . Obesity (BMI 30.0-34.9) 03/10/2011  . Health care maintenance 03/10/2011    Past Medical History  Diagnosis Date  . Hypertension   . Diabetes mellitus   . Arthritis   . Cataract     Past Surgical History  Procedure Laterality Date  . Abdominal hysterectomy  early 80's    total  . Carpal tunnel release  1980 and 1981    both hands  . Tonsillectomy and adenoidectomy  age 36  . Tubal ligation      Social History  Substance Use Topics  . Smoking status: Never Smoker   . Smokeless tobacco: Never Used  . Alcohol Use: 0.5 oz/week    1 drink(s) per week    Family History  Problem Relation Age of Onset  . Pancreatitis Father     deceased 67  . Cancer    . Cancer Brother     GI  . Cancer Mother     liver  . Cancer Son     terminal kidney    Allergies  Allergen Reactions  . Ace Inhibitors Swelling and Cough    Pt had cough and diarrhea with first few doses of medication; also had swelling of right eyelid. Stopped medication on 03/17/11.  . Alendronate Sodium Other (See Comments)    Whole body hurt  . Aspirin Hives  . Glucotrol [Glipizide] Other (See Comments)    Migraine "color flashes" in L eye with dizziness and lightheadedness.  . Lipitor [Atorvastatin Calcium] Other (See Comments)    Whole body hurt  . Metformin And Related     Severe abdominal pain    Medication list has been reviewed and updated.  Current Outpatient Prescriptions on File Prior to Visit  Medication Sig Dispense Refill  . COD LIVER OIL PO Take by mouth. One teaspoon daily    . glipiZIDE (GLIPIZIDE XL) 5 MG 24 hr tablet Take 1 tablet (5 mg total) by mouth daily with breakfast. 90 tablet 3  . glucose blood test strip Test blood sugar 3 times a day. Dx code: 250.00 100 each 12  . glucose blood  test strip Test blood sugar 3 times daily. Dx code: 250.00 100 each 12  . Insulin Glargine (LANTUS SOLOSTAR) 100 UNIT/ML Solostar Pen Inject 20 Units into the skin daily at 10 pm. 5 pen 11  . Insulin Pen Needle 32G X 4 MM MISC Use to inject insulin 1 time daily. 90 each 3  . Insulin Syringe-Needle U-100 (INSULIN SYRINGE .5CC/31GX5/16") 31G X 5/16" 0.5 ML MISC Use to inject insulin 1 time daily. 90 each 3  . vitamin E 200 UNIT capsule Take 200 Units by mouth daily.      . cholecalciferol (VITAMIN D) 1000 UNITS tablet Take 1,000 Units by mouth daily.      . [DISCONTINUED] lisinopril (PRINIVIL,ZESTRIL) 20 MG tablet Take 20 mg by mouth daily.      No current facility-administered medications on file prior to visit.    Review of Systems:  As per HPI- otherwise negative.   Physical Examination: Filed Vitals:   10/07/14 1031  BP: 170/84  Pulse:   Temp:   Resp:    Filed Vitals:   10/07/14 1025  Weight: 179 lb 3.2 oz (81.285 kg)   Body mass index is 33.88 kg/(m^2). Ideal Body Weight:    GEN: WDWN, NAD, Non-toxic, A & O x 3, overweight, looks well HEENT: Atraumatic, Normocephalic. Neck supple. No masses, No LAD. Ears and Nose: No external deformity. CV: RRR, No M/G/R. No JVD. No thrill. No extra heart sounds. PULM: CTA B, no wheezes, crackles, rhonchi. No retractions. No resp. distress. No accessory muscle use. ABD: S, NT, ND, +BS. No rebound. No HSM. EXTR: No c/c/e NEURO Normal gait.  PSYCH: Normally interactive. Conversant. Not depressed or anxious appearing.  Calm demeanor.  Both knees show minimal crepitus with good ROM, no heat, effusion or redness. Stable ligaments   [facility] reading (PRIMARY) by  Dr. Lorelei [name]. Right knee: degenerative change worst at medial compartment Left knee: degenerative change worst at medial compartment   RIGHT KNEE - COMPLETE 4+ VIEW  COMPARISON: None.  FINDINGS: Moderate joint space narrowing is noted medially. No joint effusion is seen. Some  mild patellofemoral spurring is noted. No acute fracture is seen.  IMPRESSION: Degenerative change without acute abnormality.  LEFT KNEE - COMPLETE 4+ VIEW  COMPARISON: None.  FINDINGS: The bones appear mildly demineralized. There are tricompartmental degenerative changes, most advanced medially where there is bone-on-bone apposition, osteophytes and subchondral eburnation. No evidence of acute fracture, dislocation, loose body  or significant joint effusion.  IMPRESSION: Tricompartmental osteoarthritis, worst in the medial compartment. No acute osseous findings.  Assessment and Plan: Diabetes mellitus type 2, controlled  Arthralgia of both knees - Plan: DG Knee Complete 4 Views Left, DG Knee Complete 4 Views Right, Ambulatory referral to Orthopedic Surgery  Primary osteoarthritis of both knees - Plan: Ambulatory referral to Orthopedic Surgery  Situational stress  She is seeing Wanda [name] for her DM.  She feels as though insulin is giving her some SE- will discuss other options with her at their appt next week. She might like to consider victoza which her daughter uses She does have OA in her knees- will refer to ortho to discuss options.  She does not want steroid injections due to her DM.  Might be open to other injectables or surgery Her oldest child, a son, is battling metastatic kidney cancer and is running out of options.  She feels fairly calm about this, but knows that at least subconsciously this is stressing her.  May be why her BP is a bit up today- she states that normally her home BP is very good    Signed Lamar Blinks, MD

## 2014-10-16 ENCOUNTER — Other Ambulatory Visit (INDEPENDENT_AMBULATORY_CARE_PROVIDER_SITE_OTHER): Payer: Medicare Other | Admitting: *Deleted

## 2014-10-16 ENCOUNTER — Ambulatory Visit (INDEPENDENT_AMBULATORY_CARE_PROVIDER_SITE_OTHER): Payer: Medicare Other | Admitting: Internal Medicine

## 2014-10-16 ENCOUNTER — Encounter: Payer: Self-pay | Admitting: Internal Medicine

## 2014-10-16 VITALS — BP 142/78 | HR 89 | Temp 97.7°F | Resp 14 | Wt 180.0 lb

## 2014-10-16 DIAGNOSIS — E042 Nontoxic multinodular goiter: Secondary | ICD-10-CM | POA: Diagnosis not present

## 2014-10-16 DIAGNOSIS — IMO0001 Reserved for inherently not codable concepts without codable children: Secondary | ICD-10-CM

## 2014-10-16 DIAGNOSIS — E1165 Type 2 diabetes mellitus with hyperglycemia: Secondary | ICD-10-CM

## 2014-10-16 DIAGNOSIS — Z794 Long term (current) use of insulin: Secondary | ICD-10-CM

## 2014-10-16 LAB — POCT GLYCOSYLATED HEMOGLOBIN (HGB A1C): Hemoglobin A1C: 8.2

## 2014-10-16 NOTE — Progress Notes (Signed)
Patient ID: Wanda [name], female   DOB: 1940-04-10, 74 y.o.   MRN: [id number]  HPI: Wanda [name] is a 74 y.o.-year-old female, returning for f/u for DM2, dx 2007, insulin-dependent since 07/2013, uncontrolled, without complications and also HL, intolerant to statins. Last visit 3 mo ago. New PCP: Dr [name] Copland.  Son has terminal kidney cancer - in [place] >> a lot of stress.  She will start knee injections (not steroids) soon  - OA.  She has upcoming cataract sx.  Last hemoglobin A1c was: Lab Results  Component Value Date   HGBA1C 7.5 07/16/2014   HGBA1C 7.7* 03/31/2014   HGBA1C 9.8* 12/26/2013  She has Shingles in 03/2013.  Pt is on: - Lantus 10 >> 20 >> 15 >> 17 >> off (as she had the flu and had muscle aches)>> 15 units in hs  - Glipizide XL 5 mg in am. She tried Metformin >> abdominal pain She tried Glymepiride >> stopped working She tried regular Glipizide >> dizziness She is afraid of Januvia b/c of possible pancreatic issues She started Levemir in 07/2013. She since stopped it (on 09/17/2013) as she had lower leg bone pain, blurred vision, mental fog.   Pt checks her sugars 2-3 a day - better, but forgot log and cannot remember sugars well: - am: 85, 112-160, 180 >> 93, 128-150 >> 83, 100-139 >> 93-145, now 174-193 >> 104-129, 170 - 2h after b'fast: 240, 253 >> 222 >> 176, 221 >> 172 >> 128-154 >> 101-149, 194 >> 140s - before lunch: 170-283 >> 208-262, 321 >> 141-184 >> 108-132 >> 99-123, 147 >> 97-137, 160 >> ? - 2h after lunch: 172-271, 425  ([employer] and [employer]) >> n/c >> 180, 275 >> n/c >> 90-158 >> n/c - before dinner: 241, 320, 372 >> 196-233 >> 153-213, 293 >> 93-140, 164 >> 90-124, 180 >> 94, 102-159, 194 >> ? - 2h after dinner: 237, 270 >> n/c >> 227 >> n/c  - bedtime: 199-338, 1x 518 >> 250, 256 >> 140 >> 167, 190 >> 231 x1 >> n/c - nighttime: n/c No lows. Lowest sugar was 153 >> 85 >> 93 >> 93 >> 104; ? If has hypoglycemia awareness. She mentions episodes of  tachycardia and feeling hot >> did not check sugars then. Highest sugar was 400s x1 >> 293 >> 200 >> 200s >> 190.  Pt's meals are: - Breakfast: yoghurt + fruit; eggs + onion, peppers - Lunch: salad & sardines - Dinner: meat + veggies - Snacks: 2-3: fruit, cheese, nuts  - no CKD, last BUN/creatinine:  Lab Results  Component Value Date   BUN 18 03/31/2014   CREATININE 0.91 03/31/2014   - last set of lipids: Lab Results  Component Value Date   CHOL 234* 03/31/2014   HDL 47.30 03/31/2014   LDLCALC 166* 03/31/2014   TRIG 103.0 03/31/2014   CHOLHDL 5 03/31/2014  She started statins >> "muscles frozen". She tries Fish oil. I suggested Zetia at last visits, but she did not want to start yet. - last eye exam was in 09/2014. No DR. + cataracts stage 1. - no numbness and tingling in her feet. She has a podiatrist - last foot exam 09/2013.  Of note, Sister and father died of pancreatitis. Mother died of liver CA (alcoholic).  She has a h/o goiter and thyroid nodules, previously investigated before moving to [place] with sequential U/S's.  She had a thyroid ultrasound in 03/2014 showing several smaller nodules, however, with a dominant 2.9 cm  nodule in the thyroid isthmus. We biopsied this nodule in 07/2014 and it was benign.  No neck compression sxs.   I reviewed pt's medications, allergies, PMH, social hx, family hx, and changes were documented in the history of present illness. Otherwise, unchanged from my initial visit note.  ROS: Constitutional: no weight gain/loss, + fatigue, no subjective hyperthermia/hypothermia Eyes: no blurry vision, no xerophthalmia ENT: no sore throat, no nodules palpated in throat, no dysphagia/odynophagia, no hoarseness Cardiovascular: no CP/SOB/palpitations/leg swelling Respiratory: no cough/SOB Gastrointestinal: no N/V/+ D/no C Musculoskeletal:no muscle pain /+ joint aches (knees) Skin: no rashes Neurological: no tremors/numbness/tingling/dizziness +  anxiety  PE: BP 142/78 mmHg  Pulse 89  Temp(Src) 97.7 F (36.5 C) (Oral)  Resp 14  Wt 180 lb (81.647 kg)  SpO2 98% Body mass index is 34.03 kg/(m^2). Wt Readings from Last 3 Encounters:  10/16/14 180 lb (81.647 kg)  10/07/14 179 lb 3.2 oz (81.285 kg)  07/16/14 175 lb 9.6 oz (79.652 kg)   Constitutional: overweight, in NAD Eyes: PERRLA, EOMI, no exophthalmos ENT: moist mucous membranes, + B thyromegaly and large ant nodule palpated, no cervical lymphadenopathy Cardiovascular: RRR, No MRG Respiratory: CTA B Gastrointestinal: abdomen soft, NT, ND, BS+ Musculoskeletal: no deformities, strength intact in all 4 Skin: moist, warm, no rashes Neurological: no tremor with outstretched hands, DTR normal in all 4  ASSESSMENT: 1. DM2, insulin-dependent, uncontrolled, without complications  2. Thyromegaly - bilaterally - daughter with thyroid ds. (? Type) - No FH of thyroid CA - FH of goiter on mother's side - Thyroid U/S (04/03/2014): Right thyroid lobe  Measurements: 4.6 cm x 1.9 cm x 1.5 cm. Several right-sided nodules, all of which are less than 1 cm.  Superior: 4 mm x 2 mm x 3 mm  Mid: 5 mm x 4 mm x 4 mm  Inferior: 5 mm x 3 mm x 4 mm  Left thyroid lobe  Measurements: 4.7 cm x 1.7 cm x 1.8 cm. Several left-sided thyroid nodules are present.  Dominant: 1.1 cm x 6 mm x 5 mm. This lesion is anechoic with posterior acoustic enhancement, compatible with cystic changes.  Mid: 8 mm x 5 mm x 6 mm  Mid: 5 mm x 3 mm x 4 mm.  Inferior: 1.0 cm x 8 mm by 6 mm. Inferior nodule is uniformly echoic with no calcifications.  Isthmus  Thickness: 1.5 cm. Dominant nodule of the thyroid is within the isthmus. This measures 2.3 cm x 1.5 cm x 2.9 cm with complex solid features.  Lymphadenopathy  None visualized.  IMPRESSION: Multinodular thyroid. Single nodule within the isthmus meets criteria for biopsy.              07/28/2014: FNA isthmic nodule: Adequacy  Reason Satisfactory For Evaluation. Diagnosis THYROID, ISTHMUS, FINE NEEDLE ASPIRATION (SPECIMEN 1 OF 1, COLLECTED ON 07/28/14): FINDINGS CONSISTENT WITH BENIGN THYROID NODULE (BETHESDA CATEGORY II). Willeen Niece MD Pathologist, Electronic Signature (Case signed 07/29/2014) Specimen Clinical Information Dominant nodule of the thyroid is within the isthmus, This measures 2.3 cm x 1.5 cm x 2.9 cm with complex solid  Benign results, will continue to follow.  3. HL - I repeatedly d/w the pt about her HL and recommended Ezetimibe, which she did not want to start - will not re-address  PLAN:  1. Patient with long-standing, uncontrolled diabetes, with multiple DM drugs intolerances, with worse control at last visit after stopping Lantus as she believed this was causing her mm pain. I advised her that this is very  unlikely and restarted Lantus at last visit >> sugars improved. She has possible hypogly episodes but cannot remember when they happened. She did not check sugars then. Strongly advised her to do so and let me know. Bring log at next visit, but for now, I will not make changes in her DM regimen. - I suggested to:  Patient Instructions  Please continue Lantus 15 units at bedtime Continue Glipizide XL 5 mg in am.  Please let me know if the sugars are consistently <80 or >200.  Please return in 1.5 months with your sugar log.   - continue checking sugars at different times of the day - check 2 times a day, rotating checks - advised for yearly eye exams >> she is up to date - check HbA1c today >> 8.2% - Return to clinic in 1.5 mo with sugar log   2. Thyromegaly and thyroid nodule - long h/o goiter; no compression sxs; followed by serial U/S's in the past >> unchanged - no FH of ThyCA - last set of TFTs reviewed >> normal  - we checked a thyroid U/S 03/2014 >> large thyroid nodule in isthmus >> FNA was benign (07/2014)  - We'll continue to follow this clinically, will U/S only if  feels her nodule enlarging.

## 2014-10-16 NOTE — Patient Instructions (Signed)
Please continue Lantus 15 units at bedtime Continue Glipizide XL 5 mg in am.  Please let me know if the sugars are consistently <80 or >200.  Please return in 1.5 months with your sugar log.

## 2014-10-19 ENCOUNTER — Encounter: Payer: Self-pay | Admitting: Internal Medicine

## 2014-10-20 ENCOUNTER — Encounter: Payer: Self-pay | Admitting: Family Medicine

## 2014-10-20 DIAGNOSIS — M170 Bilateral primary osteoarthritis of knee: Secondary | ICD-10-CM

## 2014-11-16 DIAGNOSIS — M170 Bilateral primary osteoarthritis of knee: Secondary | ICD-10-CM | POA: Diagnosis not present

## 2014-11-23 DIAGNOSIS — M170 Bilateral primary osteoarthritis of knee: Secondary | ICD-10-CM | POA: Diagnosis not present

## 2014-11-27 ENCOUNTER — Encounter: Payer: Self-pay | Admitting: Internal Medicine

## 2014-11-27 ENCOUNTER — Ambulatory Visit (INDEPENDENT_AMBULATORY_CARE_PROVIDER_SITE_OTHER): Payer: Medicare Other | Admitting: Internal Medicine

## 2014-11-27 VITALS — BP 160/84 | HR 79 | Temp 98.1°F | Wt 180.4 lb

## 2014-11-27 DIAGNOSIS — Z794 Long term (current) use of insulin: Secondary | ICD-10-CM

## 2014-11-27 DIAGNOSIS — E042 Nontoxic multinodular goiter: Secondary | ICD-10-CM | POA: Diagnosis not present

## 2014-11-27 DIAGNOSIS — E1165 Type 2 diabetes mellitus with hyperglycemia: Secondary | ICD-10-CM

## 2014-11-27 DIAGNOSIS — IMO0001 Reserved for inherently not codable concepts without codable children: Secondary | ICD-10-CM

## 2014-11-27 MED ORDER — GLUCOSE BLOOD VI STRP: ORAL_STRIP | Status: DC

## 2014-11-27 MED ORDER — INSULIN GLARGINE 100 UNIT/ML SOLOSTAR PEN: 20.0000 [IU] | PEN_INJECTOR | Freq: Every day | SUBCUTANEOUS | Status: DC

## 2014-11-27 NOTE — Progress Notes (Signed)
Pre visit review using our clinic review tool, if applicable. No additional management support is needed unless otherwise documented below in the visit note. 

## 2014-11-27 NOTE — Progress Notes (Signed)
Patient ID: Wanda [name], female   DOB: 1940-04-06, 74 y.o.   MRN: [id number]  HPI: Lenelle Blakeslee is a 74 y.o.-year-old female, returning for f/u for DM2, dx 2007, insulin-dependent since 07/2013, uncontrolled, without complications and also HL, intolerant to statins. Last visit 3 mo ago. New PCP: Dr [name] Copland.  Son has terminal kidney cancer - in [place] >> a lot of stress.  Last hemoglobin A1c was: Lab Results  Component Value Date   HGBA1C 8.2 10/16/2014   HGBA1C 7.5 07/16/2014   HGBA1C 7.7* 03/31/2014  She has Shingles in 03/2013.  Pt is on: - Lantus 10 >> 20 >> 15 >> 17 >> 15 >> 20 units in hs  - Glipizide XL 5 mg in am. She tried Metformin >> abdominal pain She tried Glymepiride >> stopped working She tried regular Glipizide >> dizziness She is afraid of Januvia b/c of possible pancreatic issues She started Levemir in 07/2013. She since stopped it (on 09/17/2013) as she had lower leg bone pain, blurred vision, mental fog.   Pt checks her sugars 2-3 a day - higher in am - brought log: - am: 85, 112-160, 180 >> 93, 128-150 >> 83, 100-139 >> 93-145, now 174-193 >> 104-129, 170 >> 120-167, 180 - 2h after b'fast: 240, 253 >> 222 >> 176, 221 >> 172 >> 128-154 >> 101-149, 194 >> 140s >> 127-168, 194 - before lunch: 170-283 >> 208-262, 321 >> 141-184 >> 108-132 >> 99-123, 147 >> 97-137, 160 >> 121-157, 175 - 2h after lunch: 172-271, 425  ([employer]) >> n/c >> 180, 275 >> n/c >> 90-158 >> n/c >> 122-154 - before dinner: 241, 320, 372 >> 196-233 >> 153-213, 293 >> 93-140, 164 >> 90-124, 180 >> 94, 102-159, 194 >> 112-145 - 2h after dinner: 237, 270 >> n/c >> 227 >> n/c  - bedtime: 199-338, 1x 518 >> 250, 256 >> 140 >> 167, 190 >> 231 x1 >> n/c >> 160-228, 323 - nighttime: n/c No lows. Lowest sugar was 153 >> 85 >> 93 >> 93 >> 104; ? If has hypoglycemia awareness.  Highest sugar was 400s x1 >> 293 >> 200 >> 200s >> 190.  Pt's meals are: - Breakfast: yoghurt + fruit; eggs +  onion, peppers - Lunch: salad & sardines - Dinner: meat + veggies - Snacks: 2-3: fruit, cheese, nuts  - no CKD, last BUN/creatinine:  Lab Results  Component Value Date   BUN 18 03/31/2014   CREATININE 0.91 03/31/2014   - last set of lipids: Lab Results  Component Value Date   CHOL 234* 03/31/2014   HDL 47.30 03/31/2014   LDLCALC 166* 03/31/2014   TRIG 103.0 03/31/2014   CHOLHDL 5 03/31/2014  She started statins >> "muscles frozen". She tries Fish oil. I suggested Zetia at last visits, but she did not want to start yet. - last eye exam was in 09/2014. No DR. + cataracts stage 1. - no numbness and tingling in her feet. She has a podiatrist - last foot exam 09/2013.  Of note, Sister and father died of pancreatitis. Mother died of liver CA (alcoholic).  She has a h/o goiter and thyroid nodules, previously investigated before moving to [place] with sequential U/S's.  She had a thyroid ultrasound in 03/2014 showing several smaller nodules, however, with a dominant 2.9 cm nodule in the thyroid isthmus. We biopsied this nodule in 07/2014 and it was benign.  No neck compression sxs.   I reviewed pt's medications, allergies, PMH, social hx,  family hx, and changes were documented in the history of present illness. Otherwise, unchanged from my initial visit note.  ROS: Constitutional: no weight gain/loss, + fatigue, no subjective hyperthermia/hypothermia Eyes: no blurry vision, no xerophthalmia ENT: no sore throat, no nodules palpated in throat, no dysphagia/odynophagia, no hoarseness Cardiovascular: no CP/SOB/palpitations/leg swelling Respiratory: no cough/SOB Gastrointestinal: no N/V/+ D/no C Musculoskeletal:no muscle pain /+ joint aches (knees) Skin: no rashes Neurological: no tremors/numbness/tingling/dizziness  PE: BP 160/84 mmHg  Pulse 79  Temp(Src) 98.1 F (36.7 C) (Oral)  Wt 180 lb 6.4 oz (81.829 kg) Body mass index is 34.1 kg/(m^2). Wt Readings from Last 3 Encounters:    11/27/14 180 lb 6.4 oz (81.829 kg)  10/16/14 180 lb (81.647 kg)  10/07/14 179 lb 3.2 oz (81.285 kg)   Constitutional: overweight, in NAD Eyes: PERRLA, EOMI, no exophthalmos ENT: moist mucous membranes, + B thyromegaly and large ant nodule palpated, no cervical lymphadenopathy Cardiovascular: RRR, No MRG Respiratory: CTA B Gastrointestinal: abdomen soft, NT, ND, BS+ Musculoskeletal: no deformities, strength intact in all 4 Skin: moist, warm, no rashes Neurological: no tremor with outstretched hands, DTR normal in all 4  ASSESSMENT: 1. DM2, insulin-dependent, uncontrolled, without complications  2. Thyromegaly - bilaterally - daughter with thyroid ds. (? Type) - No FH of thyroid CA - FH of goiter on mother's side - Thyroid U/S (04/03/2014): Right thyroid lobe  Measurements: 4.6 cm x 1.9 cm x 1.5 cm. Several right-sided nodules, all of which are less than 1 cm.  Superior: 4 mm x 2 mm x 3 mm  Mid: 5 mm x 4 mm x 4 mm  Inferior: 5 mm x 3 mm x 4 mm  Left thyroid lobe  Measurements: 4.7 cm x 1.7 cm x 1.8 cm. Several left-sided thyroid nodules are present.  Dominant: 1.1 cm x 6 mm x 5 mm. This lesion is anechoic with posterior acoustic enhancement, compatible with cystic changes.  Mid: 8 mm x 5 mm x 6 mm  Mid: 5 mm x 3 mm x 4 mm.  Inferior: 1.0 cm x 8 mm by 6 mm. Inferior nodule is uniformly echoic with no calcifications.  Isthmus  Thickness: 1.5 cm. Dominant nodule of the thyroid is within the isthmus. This measures 2.3 cm x 1.5 cm x 2.9 cm with complex solid features.  Lymphadenopathy  None visualized.  IMPRESSION: Multinodular thyroid. Single nodule within the isthmus meets criteria for biopsy.              07/28/2014: FNA isthmic nodule: Adequacy Reason Satisfactory For Evaluation. Diagnosis THYROID, ISTHMUS, FINE NEEDLE ASPIRATION (SPECIMEN 1 OF 1, COLLECTED ON 07/28/14): FINDINGS CONSISTENT WITH BENIGN THYROID NODULE (BETHESDA CATEGORY  II). Willeen Niece MD Pathologist, Electronic Signature (Case signed 07/29/2014) Specimen Clinical Information Dominant nodule of the thyroid is within the isthmus, This measures 2.3 cm x 1.5 cm x 2.9 cm with complex solid  Benign results, will continue to follow.  3. HL - I repeatedly d/w the pt about her HL and recommended Ezetimibe, which she did not want to start - will not re-address  PLAN:  1. Patient with long-standing, uncontrolled diabetes, with multiple DM drugs intolerances, with worse control after starting knee Hyaluronic acid injections, although this is not a steroid... She increased her Lantus back to 20. Sugars are highest at bedtime and they remain high in am >> I advised her to add 1/2 a Glipizide tablet (IR) before a larger dinner. I also advised her that she may need 2 x 1/2 tablets  for Thanksgiving dinner or another particularly large meal. - reviewed last HbA1c >> higher, 8.2%. - I suggested to:  Patient Instructions  Please continue Lantus 20 units at bedtime, however, you may need to decrease the dose by 2 units if the am sugars are <150.  Continue Glipizide XL 5 mg in am.  Take 1/2 tablet of a Glipizide XL 5 mg tablet before a larger dinner.   Please return in 3 months with your sugar log.   - continue checking sugars at different times of the day - check 2 times a day, rotating checks - advised for yearly eye exams >> she is up to date - refuses flu shot - Return to clinic in 3 mo with sugar log   2. Thyromegaly and thyroid nodule - long h/o goiter; no compression sxs; followed by serial U/S's in the past >> unchanged - no FH of ThyCA - last set of TFTs reviewed >> normal  - we checked a thyroid U/S 03/2014 >> large thyroid nodule in isthmus >> FNA was benign (07/2014)  - We'll continue to follow this clinically, will U/S only if feels her nodule enlarging.

## 2014-11-27 NOTE — Patient Instructions (Signed)
Please continue Lantus 20 units at bedtime, however, you may need to decrease the dose by 2 units if the am sugars are <150.  Continue Glipizide XL 5 mg in am.  Take 1/2 tablet of a Glipizide XL 5 mg tablet before a larger dinner.   Please return in 3 months with your sugar log.

## 2014-11-30 ENCOUNTER — Other Ambulatory Visit: Payer: Self-pay | Admitting: Internal Medicine

## 2014-11-30 DIAGNOSIS — M170 Bilateral primary osteoarthritis of knee: Secondary | ICD-10-CM | POA: Diagnosis not present

## 2014-12-08 DIAGNOSIS — M170 Bilateral primary osteoarthritis of knee: Secondary | ICD-10-CM | POA: Diagnosis not present

## 2014-12-08 DIAGNOSIS — M25561 Pain in right knee: Secondary | ICD-10-CM | POA: Diagnosis not present

## 2014-12-08 DIAGNOSIS — M25562 Pain in left knee: Secondary | ICD-10-CM | POA: Diagnosis not present

## 2014-12-15 DIAGNOSIS — M1711 Unilateral primary osteoarthritis, right knee: Secondary | ICD-10-CM | POA: Diagnosis not present

## 2014-12-15 DIAGNOSIS — M1712 Unilateral primary osteoarthritis, left knee: Secondary | ICD-10-CM | POA: Diagnosis not present

## 2015-01-12 DIAGNOSIS — M1712 Unilateral primary osteoarthritis, left knee: Secondary | ICD-10-CM | POA: Diagnosis not present

## 2015-01-12 DIAGNOSIS — M1711 Unilateral primary osteoarthritis, right knee: Secondary | ICD-10-CM | POA: Diagnosis not present

## 2015-01-21 DIAGNOSIS — D1801 Hemangioma of skin and subcutaneous tissue: Secondary | ICD-10-CM | POA: Diagnosis not present

## 2015-01-21 DIAGNOSIS — L814 Other melanin hyperpigmentation: Secondary | ICD-10-CM | POA: Diagnosis not present

## 2015-01-21 DIAGNOSIS — L821 Other seborrheic keratosis: Secondary | ICD-10-CM | POA: Diagnosis not present

## 2015-01-29 ENCOUNTER — Encounter: Payer: Self-pay | Admitting: Family Medicine

## 2015-02-03 ENCOUNTER — Encounter: Payer: Self-pay | Admitting: Family Medicine

## 2015-02-25 ENCOUNTER — Ambulatory Visit: Payer: Self-pay | Admitting: Internal Medicine

## 2015-03-01 ENCOUNTER — Encounter: Payer: Self-pay | Admitting: Internal Medicine

## 2015-03-01 ENCOUNTER — Ambulatory Visit (INDEPENDENT_AMBULATORY_CARE_PROVIDER_SITE_OTHER): Payer: Medicare Other | Admitting: Internal Medicine

## 2015-03-01 ENCOUNTER — Other Ambulatory Visit (INDEPENDENT_AMBULATORY_CARE_PROVIDER_SITE_OTHER): Payer: Medicare Other | Admitting: *Deleted

## 2015-03-01 VITALS — BP 130/80 | HR 81 | Temp 97.8°F | Resp 12 | Wt 180.6 lb

## 2015-03-01 DIAGNOSIS — Z794 Long term (current) use of insulin: Secondary | ICD-10-CM

## 2015-03-01 DIAGNOSIS — E042 Nontoxic multinodular goiter: Secondary | ICD-10-CM | POA: Diagnosis not present

## 2015-03-01 DIAGNOSIS — IMO0001 Reserved for inherently not codable concepts without codable children: Secondary | ICD-10-CM

## 2015-03-01 DIAGNOSIS — E1165 Type 2 diabetes mellitus with hyperglycemia: Secondary | ICD-10-CM

## 2015-03-01 LAB — POCT GLYCOSYLATED HEMOGLOBIN (HGB A1C): Hemoglobin A1C: 8

## 2015-03-01 MED ORDER — GLIPIZIDE ER 5 MG PO TB24: 5.0000 mg | ORAL_TABLET | Freq: Every day | ORAL | Status: DC

## 2015-03-01 NOTE — Progress Notes (Signed)
Patient ID: Wanda [name], female   DOB: [birth year], 75 y.o.   MRN: [id number]  HPI: Wanda [name] is a 76 y.o.-year-old female, returning for f/u for DM2, dx 2007, insulin-dependent since 07/2013, uncontrolled, without complications and also HL, intolerant to statins. Last visit 3 mo ago. New PCP: Dr [name] Copland.  She will have TKR this year.  Last hemoglobin A1c was: Lab Results  Component Value Date   HGBA1C 8.2 10/16/2014   HGBA1C 7.5 07/16/2014   HGBA1C 7.7* 03/31/2014  She has Shingles in 03/2013.  Pt is on: - Lantus 10 >> 20 >> 15 >> 17 >> 15 >> 20 >> 16 units in hs  - Glipizide XL 5 mg in am (after b'fast) - 1/2 Glipizide XL tablet (2.5 mg) after dinner She tried Metformin >> abdominal pain She tried Glymepiride >> stopped working She tried regular Glipizide >> dizziness She is afraid of Januvia b/c of possible pancreatic issues She started Levemir in 07/2013. She since stopped it (on 09/17/2013) as she had lower leg bone pain, blurred vision, mental fog.   Pt checks her sugars 2-3 a day - higher in am - brought log: - am: 93, 128-150 >> 83, 100-139 >> 93-145, now 174-193 >> 104-129, 170 >> 120-167, 180 >> 126-167, 181, 203 - 2h after b'fast: 240, 253 >> 222 >> 176, 221 >> 172 >> 128-154 >> 101-149, 194 >> 140s >> 127-168, 194 >> 143-172 - before lunch: 208-262, 321 >> 141-184 >> 108-132 >> 99-123, 147 >> 97-137, 160 >> 121-157, 175 >> 115-132, 216 - 2h after lunch: 172-271, 425  ([employer]) >> n/c >> 180, 275 >> n/c >> 90-158 >> n/c >> 122-154 >> 119-153, 210 - before dinner: 196-233 >> 153-213, 293 >> 93-140, 164 >> 90-124, 180 >> 94, 102-159, 194 >> 112-145 >> 102-142 - 2h after dinner: 237, 270 >> n/c >> 227 >> n/c  - bedtime: 199-338, 1x 518 >> 250, 256 >> 140 >> 167, 190 >> 231 x1 >> n/c >> 160-228, 323 >> 168-213 - nighttime: n/c No lows. Lowest sugar was 153 >> 85 >> 93 >> 93 >> 104 >> 102; ? If has hypoglycemia awareness.  Highest sugar was 400s x1 >> 293  >> 200 >> 200s >> 190 >> 213.  Pt's meals are: - Breakfast: yoghurt + fruit; eggs + onion, peppers - Lunch: salad & sardines - Dinner: meat + veggies - Snacks: 2-3: fruit, cheese, nuts  - no CKD, last BUN/creatinine:  Lab Results  Component Value Date   BUN 18 03/31/2014   CREATININE 0.91 03/31/2014   - last set of lipids: Lab Results  Component Value Date   CHOL 234* 03/31/2014   HDL 47.30 03/31/2014   LDLCALC 166* 03/31/2014   TRIG 103.0 03/31/2014   CHOLHDL 5 03/31/2014  She started statins >> "muscles frozen". She tries Fish oil. I suggested Zetia at last visits, but she did not want to start yet. - last eye exam was in 09/2014. No DR. + cataracts stage 1. She will cataract sx in 03/2015. - no numbness and tingling in her feet. She has a podiatrist - last foot exam 09/2013.  Of note, Sister and father died of pancreatitis. Mother died of liver CA (alcoholic).  She has a h/o goiter and thyroid nodules, previously investigated before moving to [place] with sequential U/S's.  She had a thyroid ultrasound in 03/2014 showing several smaller nodules, however, with a dominant 2.9 cm nodule in the thyroid isthmus. We biopsied this  nodule in 07/2014 and it was benign.  No neck compression sxs.   Son has terminal kidney cancer - in [place] >> a lot of stress.   I reviewed pt's medications, allergies, PMH, social hx, family hx, and changes were documented in the history of present illness. Otherwise, unchanged from my initial visit note.  ROS: Constitutional: no weight gain/loss, + fatigue, no subjective hyperthermia/hypothermia Eyes: no blurry vision, no xerophthalmia ENT: no sore throat, no nodules palpated in throat, no dysphagia/odynophagia, no hoarseness Cardiovascular: no CP/SOB/palpitations/leg swelling Respiratory: no cough/SOB Gastrointestinal: no N/V/+ D/no C Musculoskeletal:no muscle pain /+ joint aches (knees) Skin: no rashes Neurological: no  tremors/numbness/tingling/dizziness  PE: BP 130/80 mmHg  Pulse 81  Temp(Src) 97.8 F (36.6 C) (Oral)  Resp 12  Wt 180 lb 9.6 oz (81.92 kg)  SpO2 95% Body mass index is 34.14 kg/(m^2). Wt Readings from Last 3 Encounters:  03/01/15 180 lb 9.6 oz (81.92 kg)  11/27/14 180 lb 6.4 oz (81.829 kg)  10/16/14 180 lb (81.647 kg)   Constitutional: overweight, in NAD Eyes: PERRLA, EOMI, no exophthalmos ENT: moist mucous membranes, + B thyromegaly and large ant nodule palpated, no cervical lymphadenopathy Cardiovascular: RRR, No MRG Respiratory: CTA B Gastrointestinal: abdomen soft, NT, ND, BS+ Musculoskeletal: no deformities, strength intact in all 4 Skin: moist, warm, no rashes Neurological: no tremor with outstretched hands, DTR normal in all 4  ASSESSMENT: 1. DM2, insulin-dependent, uncontrolled, without complications  2. Thyromegaly - bilaterally - daughter with thyroid ds. (? Type) - No FH of thyroid CA - FH of goiter on mother's side - Thyroid U/S (04/03/2014): Right thyroid lobe  Measurements: 4.6 cm x 1.9 cm x 1.5 cm. Several right-sided nodules, all of which are less than 1 cm.  Superior: 4 mm x 2 mm x 3 mm  Mid: 5 mm x 4 mm x 4 mm  Inferior: 5 mm x 3 mm x 4 mm  Left thyroid lobe  Measurements: 4.7 cm x 1.7 cm x 1.8 cm. Several left-sided thyroid nodules are present.  Dominant: 1.1 cm x 6 mm x 5 mm. This lesion is anechoic with posterior acoustic enhancement, compatible with cystic changes.  Mid: 8 mm x 5 mm x 6 mm  Mid: 5 mm x 3 mm x 4 mm.  Inferior: 1.0 cm x 8 mm by 6 mm. Inferior nodule is uniformly echoic with no calcifications.  Isthmus  Thickness: 1.5 cm. Dominant nodule of the thyroid is within the isthmus. This measures 2.3 cm x 1.5 cm x 2.9 cm with complex solid features.  Lymphadenopathy  None visualized.  IMPRESSION: Multinodular thyroid. Single nodule within the isthmus meets criteria for biopsy.              07/28/2014:  FNA isthmic nodule: Adequacy Reason Satisfactory For Evaluation. Diagnosis THYROID, ISTHMUS, FINE NEEDLE ASPIRATION (SPECIMEN 1 OF 1, COLLECTED ON 07/28/14): FINDINGS CONSISTENT WITH BENIGN THYROID NODULE (BETHESDA CATEGORY II). Willeen Niece MD Pathologist, Electronic Signature (Case signed 07/29/2014) Specimen Clinical Information Dominant nodule of the thyroid is within the isthmus, This measures 2.3 cm x 1.5 cm x 2.9 cm with complex solid  Benign results, will continue to follow.  3. HL - I repeatedly d/w the pt about her HL and recommended Ezetimibe, which she did not want to start - will not re-address  PLAN:  1. Patient with long-standing, uncontrolled diabetes, with multiple DM drugs intolerances, with worse control after starting knee Hyaluronic acid injections, although this is not a steroid...  Sugars stable  since last visit. Target for her HbA1c <7.5%. - reviewed last HbA1c >> higher, 8.2%. Will recheck today >> 8.0% (slightly better) - I suggested to:  Patient Instructions  Please increase Lantus to 18 units at bedtime. Continue Glipizide XL 5 mg in am. Take 1/2 tablet of a Glipizide XL 5 mg tablet before dinner.   Please return in 3 months with your sugar log.   - continue checking sugars at different times of the day - check 2 times a day, rotating checks - advised for yearly eye exams >> she is up to date - refused flu shot - Return to clinic in 3 mo with sugar log   2. Thyromegaly and thyroid nodule - long h/o goiter; no compression sxs; followed by serial U/S's in the past >> unchanged - no FH of ThyCA - last set of TFTs reviewed >> normal: Lab Results  Component Value Date   TSH 1.26 03/31/2014  - we checked a thyroid U/S 03/2014 >> large thyroid nodule in isthmus >> FNA was benign (07/2014)  - We'll continue to follow this clinically, will U/S only if feels her nodule enlarging.

## 2015-03-01 NOTE — Patient Instructions (Signed)
Please increase Lantus to 18 units at bedtime. Continue Glipizide XL 5 mg in am. Take 1/2 tablet of a Glipizide XL 5 mg tablet before dinner.   Please return in 3 months with your sugar log.

## 2015-03-16 DIAGNOSIS — H18411 Arcus senilis, right eye: Secondary | ICD-10-CM | POA: Diagnosis not present

## 2015-03-16 DIAGNOSIS — H2511 Age-related nuclear cataract, right eye: Secondary | ICD-10-CM | POA: Diagnosis not present

## 2015-03-16 DIAGNOSIS — H02839 Dermatochalasis of unspecified eye, unspecified eyelid: Secondary | ICD-10-CM | POA: Diagnosis not present

## 2015-03-16 DIAGNOSIS — H18412 Arcus senilis, left eye: Secondary | ICD-10-CM | POA: Diagnosis not present

## 2015-04-06 ENCOUNTER — Other Ambulatory Visit: Payer: Self-pay | Admitting: *Deleted

## 2015-04-06 ENCOUNTER — Encounter: Payer: Self-pay | Admitting: Internal Medicine

## 2015-04-06 MED ORDER — ONETOUCH ULTRA MINI W/DEVICE KIT: PACK | Status: DC

## 2015-04-06 NOTE — Telephone Encounter (Signed)
Pt requested a new meter via MyChart. Rx for new meter sent to pt's pharmacy.

## 2015-04-15 ENCOUNTER — Ambulatory Visit (INDEPENDENT_AMBULATORY_CARE_PROVIDER_SITE_OTHER): Payer: Medicare Other | Admitting: Family Medicine

## 2015-04-15 ENCOUNTER — Encounter: Payer: Self-pay | Admitting: Family Medicine

## 2015-04-15 VITALS — BP 150/90 | HR 83 | Temp 98.0°F | Ht 61.0 in | Wt 179.6 lb

## 2015-04-15 DIAGNOSIS — Z0000 Encounter for general adult medical examination without abnormal findings: Secondary | ICD-10-CM | POA: Diagnosis not present

## 2015-04-15 DIAGNOSIS — Z5181 Encounter for therapeutic drug level monitoring: Secondary | ICD-10-CM

## 2015-04-15 DIAGNOSIS — E049 Nontoxic goiter, unspecified: Secondary | ICD-10-CM | POA: Diagnosis not present

## 2015-04-15 DIAGNOSIS — E1165 Type 2 diabetes mellitus with hyperglycemia: Secondary | ICD-10-CM | POA: Diagnosis not present

## 2015-04-15 DIAGNOSIS — E785 Hyperlipidemia, unspecified: Secondary | ICD-10-CM

## 2015-04-15 DIAGNOSIS — Z794 Long term (current) use of insulin: Secondary | ICD-10-CM | POA: Diagnosis not present

## 2015-04-15 DIAGNOSIS — Z780 Asymptomatic menopausal state: Secondary | ICD-10-CM | POA: Diagnosis not present

## 2015-04-15 DIAGNOSIS — IMO0001 Reserved for inherently not codable concepts without codable children: Secondary | ICD-10-CM

## 2015-04-15 LAB — COMPREHENSIVE METABOLIC PANEL
ALK PHOS: 54 U/L (ref 39–117)
ALT: 20 U/L (ref 0–35)
AST: 18 U/L (ref 0–37)
Albumin: 4.3 g/dL (ref 3.5–5.2)
BILIRUBIN TOTAL: 0.5 mg/dL (ref 0.2–1.2)
BUN: 15 mg/dL (ref 6–23)
CO2: 24 mEq/L (ref 19–32)
CREATININE: 0.83 mg/dL (ref 0.40–1.20)
Calcium: 10.3 mg/dL (ref 8.4–10.5)
Chloride: 106 mEq/L (ref 96–112)
GFR: 71.18 mL/min (ref >60.00)
GLUCOSE: 180 mg/dL — AB (ref 70–99)
Potassium: 3.9 mEq/L (ref 3.5–5.1)
Sodium: 139 mEq/L (ref 135–145)
TOTAL PROTEIN: 7.7 g/dL (ref 6.0–8.3)

## 2015-04-15 LAB — LDL CHOLESTEROL, DIRECT: LDL DIRECT: 156 mg/dL

## 2015-04-15 LAB — LIPID PANEL
Cholesterol: 238 mg/dL — ABNORMAL HIGH (ref 0–200)
HDL: 42.1 mg/dL (ref >39.00)
NonHDL: 195.83
Total CHOL/HDL Ratio: 6
Triglycerides: 206 mg/dL — ABNORMAL HIGH (ref 0.0–149.0)
VLDL: 41.2 mg/dL — ABNORMAL HIGH (ref 0.0–40.0)

## 2015-04-15 LAB — CBC
HCT: 38.9 % (ref 36.0–46.0)
HEMOGLOBIN: 13.2 g/dL (ref 12.0–15.0)
MCHC: 33.8 g/dL (ref 30.0–36.0)
MCV: 89.2 fl (ref 78.0–100.0)
Platelets: 258 10*3/uL (ref 150.0–400.0)
RBC: 4.36 Mil/uL (ref 3.87–5.11)
RDW: 13.2 % (ref 11.5–15.5)
WBC: 6.8 10*3/uL (ref 4.0–10.5)

## 2015-04-15 LAB — TSH: TSH: 1.23 u[IU]/mL (ref 0.35–4.50)

## 2015-04-15 NOTE — Patient Instructions (Addendum)
It was great to see you today I will be in touch with your labs We will arrange a bone density scan for you You should hear from the cologuard company soon and they will mail you the kit for your test.  Please have your BP cuff checked the next time you are seen to make sure it is accurate-   Assuming it is accurate  I think your BP is ok without medicatoin

## 2015-04-15 NOTE — Progress Notes (Signed)
Pre visit review using our clinic review tool, if applicable. No additional management support is needed unless otherwise documented below in the visit note. 

## 2015-04-15 NOTE — Progress Notes (Signed)
[place] at [facility] [address], [place], [place] [address] [phone]  Date:  04/15/2015   Name:  Wanda [name]   DOB:  [birth year]   MRN:  [id number]  PCP:  Lamar Blinks, MD    Chief Complaint: Medicare Wellness   History of Present Illness:  Wanda [name] is a 75 y.o. very pleasant female patient who presents with the following:  History of HTN, dyslipidemia, obesity, DM2 managed by Dr. Cruzita Lederer. She is on glipizide and lantus.  She notes that she is often very hungry and eats too much which makes it harder to control her DM. However she has improved from her max A1c of 12%.  She is on 20 units of lantus now  She did have shingles on her lower back about a year ago.   She is not quite fasting today- she ate just a yogurt and coffee with cream.    She has not had a colonoscopy and does not want to have one now.   She is willing to try cologuard however. She declines all vaccines today She had a bone density scan 6-7 years ago in [place]- it looked ok.  She is willing to have another one Not on any BP medication-She reports home BP 130/ 60-70.  Max 145/78 per her home cuff.    Lab Results  Component Value Date   HGBA1C 8.0 03/01/2015     Patient Active Problem List   Diagnosis Date Noted  . Multinodular goiter (nontoxic) 04/07/2014  . Diabetes mellitus type 2, uncontrolled, without complications ([place]) 09/40/7680  . Insulin adverse reaction 09/27/2013  . Senile nuclear sclerosis 08/14/2012  . HTN, goal below 130/80 03/10/2011  . Hyperlipidemia with target LDL less than 70 03/10/2011  . Obesity (BMI 30.0-34.9) 03/10/2011  . Health care maintenance 03/10/2011    Past Medical History  Diagnosis Date  . Hypertension   . Diabetes mellitus   . Arthritis   . Cataract     Past Surgical History  Procedure Laterality Date  . Abdominal hysterectomy  early 80's    total  . Carpal tunnel release  1980 and 1981     both hands  . Tonsillectomy and adenoidectomy  age 79  . Tubal ligation      Social History  Substance Use Topics  . Smoking status: Never Smoker   . Smokeless tobacco: Never Used  . Alcohol Use: 0.5 oz/week    1 drink(s) per week    Family History  Problem Relation Age of Onset  . Pancreatitis Father     deceased 39  . Cancer    . Cancer Brother     GI  . Cancer Mother     liver  . Cancer Son     terminal kidney    Allergies  Allergen Reactions  . Ace Inhibitors Swelling and Cough    Pt had cough and diarrhea with first few doses of medication; also had swelling of right eyelid. Stopped medication on 03/17/11.  . Alendronate Sodium Other (See Comments)    Whole body hurt  . Aspirin Hives  . Glucotrol [Glipizide] Other (See Comments)    Migraine "color flashes" in L eye with dizziness and lightheadedness.  . Lipitor [Atorvastatin Calcium] Other (See Comments)    Whole body hurt  . Metformin And Related     Severe abdominal pain    Medication list has been reviewed and updated.  Current Outpatient Prescriptions on  File Prior to Visit  Medication Sig Dispense Refill  . Blood Glucose Monitoring Suppl (ONE TOUCH ULTRA MINI) w/Device KIT Use to test blood sugar daily as instructed. Dx: E11.65 1 each 0  . COD LIVER OIL PO Take by mouth. One teaspoon daily    . glipiZIDE (GLIPIZIDE XL) 5 MG 24 hr tablet Take 1 tablet (5 mg total) by mouth daily with breakfast. 90 tablet 3  . glucose blood test strip Test blood sugar 3 times a day. Dx code: 250.00 100 each 12  . Insulin Glargine (LANTUS SOLOSTAR) 100 UNIT/ML Solostar Pen Inject 20 Units into the skin daily at 10 pm. 5 pen 11  . Insulin Pen Needle 32G X 4 MM MISC Use to inject insulin 1 time daily. 90 each 3  . Insulin Syringe-Needle U-100 (INSULIN SYRINGE .5CC/31GX5/16") 31G X 5/16" 0.5 ML MISC Use to inject insulin 1 time daily. 90 each 3  . vitamin E 200 UNIT capsule Take 200 Units by mouth daily.      . [DISCONTINUED]  lisinopril (PRINIVIL,ZESTRIL) 20 MG tablet Take 20 mg by mouth daily.      No current facility-administered medications on file prior to visit.    Review of Systems:  As per HPI- otherwise negative.   Physical Examination: Filed Vitals:   04/15/15 0950  BP: 158/90  Pulse: 83  Temp: 98 F (36.7 C)   Filed Vitals:   04/15/15 0950  Height: 5' 1"  (1.549 m)  Weight: 179 lb 9.6 oz (81.466 kg)   Body mass index is 33.95 kg/(m^2). Ideal Body Weight: Weight in (lb) to have BMI = 25: 132  GEN: WDWN, NAD, Non-toxic, A & O x 3, obese, looks well HEENT: Atraumatic, Normocephalic. Neck supple. No masses, No LAD.  Bilateral TM wnl, oropharynx normal.  PEERL,EOMI.   Ears and Nose: No external deformity. CV: RRR, No M/G/R. No JVD. No thrill. No extra heart sounds. PULM: CTA B, no wheezes, crackles, rhonchi. No retractions. No resp. distress. No accessory muscle use. ABD: S, NT, ND, +BS. No rebound. No HSM. EXTR: No c/c/e NEURO Normal gait.  PSYCH: Normally interactive. Conversant. Not depressed or anxious appearing.  Calm demeanor.  Normal foot exam today   Assessment and Plan: Physical exam  Post-menopausal - Plan: DG Bone Density  Goiter - Plan: TSH  Hyperlipidemia - Plan: Lipid panel  Medication monitoring encounter - Plan: CBC  Uncontrolled type 2 diabetes mellitus without complication, with long-term current use of insulin (HCC) - Plan: Comprehensive metabolic panel  Declines vaccines today Her DM is tricky to control as she is unwilling to take meds that could increase pancreatitis risk She reports that her home BP is normal- asked her to have her home BP cuff checked the next time she is at MD office.   Labs pending as above  Signed Lamar Blinks, MD

## 2015-04-19 ENCOUNTER — Encounter: Payer: Self-pay | Admitting: Family Medicine

## 2015-04-19 DIAGNOSIS — E785 Hyperlipidemia, unspecified: Secondary | ICD-10-CM

## 2015-04-19 MED ORDER — NIACIN ER (ANTIHYPERLIPIDEMIC) 500 MG PO TBCR: 500.0000 mg | EXTENDED_RELEASE_TABLET | Freq: Every day | ORAL | Status: DC

## 2015-04-26 ENCOUNTER — Encounter: Payer: Self-pay | Admitting: Internal Medicine

## 2015-04-27 ENCOUNTER — Other Ambulatory Visit: Payer: Self-pay | Admitting: *Deleted

## 2015-04-27 MED ORDER — INSULIN GLARGINE 100 UNIT/ML SOLOSTAR PEN: 20.0000 [IU] | PEN_INJECTOR | Freq: Every day | SUBCUTANEOUS | Status: DC

## 2015-04-28 ENCOUNTER — Telehealth: Payer: Self-pay | Admitting: Family Medicine

## 2015-04-28 DIAGNOSIS — E2839 Other primary ovarian failure: Secondary | ICD-10-CM

## 2015-04-28 NOTE — Telephone Encounter (Signed)
-----   Message from Sandre Kitty sent at 04/26/2015 10:44 AM EDT ----- Regarding: DEXA ORDER Medicare has changed the approved diagnoses for DEXA exams.   Below is what is now considered to be eligible according to [employer]:  All qualified people with Medicare who are at risk for osteoporosis and meet one or more of these conditions:  #A woman whose doctor determines she's estrogen deficient and at risk for osteoporosis, based on her medical history and other findings. #A person whose X-rays show possible osteoporosis, osteopenia, or vertebral fractures. #A person taking prednisone or steroid-type drugs or is planning to begin this treatment. #A person who has been diagnosed with primary hyperparathyroidism. #A person who is being monitored to see if their osteoporosis therapy is working.  Please update the diagnosis in EPIC if your patient qualifies under one of the above diagnoses.     Thank you!

## 2015-04-28 NOTE — Telephone Encounter (Signed)
Taken care of, changed code to estrogen def

## 2015-05-04 DIAGNOSIS — D3131 Benign neoplasm of right choroid: Secondary | ICD-10-CM | POA: Diagnosis not present

## 2015-05-04 DIAGNOSIS — H43813 Vitreous degeneration, bilateral: Secondary | ICD-10-CM | POA: Diagnosis not present

## 2015-05-04 DIAGNOSIS — E113293 Type 2 diabetes mellitus with mild nonproliferative diabetic retinopathy without macular edema, bilateral: Secondary | ICD-10-CM | POA: Diagnosis not present

## 2015-05-18 DIAGNOSIS — Z1212 Encounter for screening for malignant neoplasm of rectum: Secondary | ICD-10-CM | POA: Diagnosis not present

## 2015-05-18 DIAGNOSIS — Z1211 Encounter for screening for malignant neoplasm of colon: Secondary | ICD-10-CM | POA: Diagnosis not present

## 2015-05-21 ENCOUNTER — Other Ambulatory Visit: Payer: Self-pay

## 2015-05-24 DIAGNOSIS — H2511 Age-related nuclear cataract, right eye: Secondary | ICD-10-CM | POA: Diagnosis not present

## 2015-05-24 DIAGNOSIS — H25811 Combined forms of age-related cataract, right eye: Secondary | ICD-10-CM | POA: Diagnosis not present

## 2015-05-25 DIAGNOSIS — H2512 Age-related nuclear cataract, left eye: Secondary | ICD-10-CM | POA: Diagnosis not present

## 2015-05-28 LAB — COLOGUARD: Cologuard: NEGATIVE

## 2015-05-31 ENCOUNTER — Other Ambulatory Visit (INDEPENDENT_AMBULATORY_CARE_PROVIDER_SITE_OTHER): Payer: Medicare Other | Admitting: *Deleted

## 2015-05-31 ENCOUNTER — Encounter: Payer: Self-pay | Admitting: Internal Medicine

## 2015-05-31 ENCOUNTER — Encounter: Payer: Self-pay | Admitting: Family Medicine

## 2015-05-31 ENCOUNTER — Ambulatory Visit (INDEPENDENT_AMBULATORY_CARE_PROVIDER_SITE_OTHER): Payer: Medicare Other | Admitting: Internal Medicine

## 2015-05-31 VITALS — BP 128/70 | HR 81 | Temp 98.1°F | Resp 12 | Wt 177.0 lb

## 2015-05-31 DIAGNOSIS — IMO0001 Reserved for inherently not codable concepts without codable children: Secondary | ICD-10-CM

## 2015-05-31 DIAGNOSIS — Z794 Long term (current) use of insulin: Secondary | ICD-10-CM | POA: Diagnosis not present

## 2015-05-31 DIAGNOSIS — E1165 Type 2 diabetes mellitus with hyperglycemia: Secondary | ICD-10-CM | POA: Diagnosis not present

## 2015-05-31 DIAGNOSIS — E042 Nontoxic multinodular goiter: Secondary | ICD-10-CM | POA: Diagnosis not present

## 2015-05-31 DIAGNOSIS — H2511 Age-related nuclear cataract, right eye: Secondary | ICD-10-CM | POA: Diagnosis not present

## 2015-05-31 LAB — POCT GLYCOSYLATED HEMOGLOBIN (HGB A1C): Hemoglobin A1C: 8.7

## 2015-05-31 MED ORDER — INSULIN GLARGINE 100 UNIT/ML SOLOSTAR PEN
20.0000 [IU] | PEN_INJECTOR | Freq: Every day | SUBCUTANEOUS | Status: DC
Start: 2015-05-31 — End: 2015-10-06

## 2015-05-31 MED ORDER — GLIPIZIDE ER 5 MG PO TB24: 10.0000 mg | ORAL_TABLET | Freq: Every day | ORAL | Status: DC

## 2015-05-31 NOTE — Patient Instructions (Signed)
Please continue: - Lantus 18 units at bedtime.  Please increase: - Glipizide XL to 10 mg in am. - crush 1 tablet of Glipizide XL 5 mg tablet before dinner.  Please work on your diet and reduce the doses to the previous regimen if sugars start to come down.  Please return in 3 months with your sugar log.

## 2015-05-31 NOTE — Progress Notes (Signed)
Patient ID: Wanda [name], female   DOB: 11-13-1940, 75 y.o.   MRN: [id number]  HPI: Wanda [name] is a 75 y.o.-year-old female, returning for f/u for DM2, dx 2007, insulin-dependent since 07/2013, uncontrolled, without complications and also thyroid nodules. Last visit 3 mo ago. New PCP: Dr [name] Copland.  Last hemoglobin A1c was: Lab Results  Component Value Date   HGBA1C 8.0 03/01/2015   HGBA1C 8.2 10/16/2014   HGBA1C 7.5 07/16/2014  She has Shingles in 03/2013.  Pt is on: - Lantus 16 >> 18 >> 20-25 units in hs  - Glipizide XL 5 mg in am - stopper for a week as she had dizziness - 1/2 Glipizide XL tablet (2.5 mg) before dinner She tried Metformin >> abdominal pain She tried Glymepiride >> stopped working She tried regular Glipizide >> dizziness She is afraid of Januvia b/c of possible pancreatic issues She started Levemir in 07/2013. She since stopped it (on 09/17/2013) as she had lower leg bone pain, blurred vision, mental fog.   Pt checks her sugars 2-3 a day - higher in am - brought log: - am: 83, 100-139 >> 93-145, now 174-193 >> 104-129, 170 >> 120-167, 180 >> 126-167, 181, 203 >> 97, 146-187, 205, 218 - 2h after b'fast: 222 >> 176, 221 >> 172 >> 128-154 >> 101-149, 194 >> 140s >> 127-168, 194 >> 143-172 >> 136-220 - before lunch: 141-184 >> 108-132 >> 99-123, 147 >> 97-137, 160 >> 121-157, 175 >> 115-132, 216 >> 150-210 - 2h after lunch: 180, 275 >> n/c >> 90-158 >> n/c >> 122-154 >> 119-153, 210 >> 136-227 - before dinner: 153-213, 293 >> 93-140, 164 >> 90-124, 180 >> 94, 102-159, 194 >> 112-145 >> 102-142 >> 105-226 - 2h after dinner: 237, 270 >> n/c >> 227 >> n/c  - bedtime: 199-338, 1x 518 >> 250, 256 >> 140 >> 167, 190 >> 231 x1 >> n/c >> 160-228, 323 >> 168-213 >> 245-330 - nighttime: n/c No lows. Lowest sugar was 102 >> 97; ? If has hypoglycemia awareness.  Highest sugar was 213 >> 330.  Pt's meals are: - Breakfast: yoghurt + fruit; eggs + onion, peppers - Lunch:  salad & sardines - Dinner: meat + veggies - Snacks: 2-3: fruit, cheese, nuts  - no CKD, last BUN/creatinine:  Lab Results  Component Value Date   BUN 15 04/15/2015   CREATININE 0.83 04/15/2015   - last set of lipids: Lab Results  Component Value Date   CHOL 238* 04/15/2015   HDL 42.10 04/15/2015   LDLCALC 166* 03/31/2014   LDLDIRECT 156.0 04/15/2015   TRIG 206.0* 04/15/2015   CHOLHDL 6 04/15/2015  She started statins >> "muscles frozen". She tried Fish oil. I suggested Zetia at last visits, but she did not want to start yet. - last eye exam was in 09/2014. No DR. + cataracts stage 1. She had R cataract sx on 05/24/2015. She will have L eye cataract sx on 06/07/2015. - no numbness and tingling in her feet. She has a podiatrist - last foot exam 09/2013.  Of note, Sister and father died of pancreatitis. Mother died of liver CA (alcoholic).  She has a h/o goiter and thyroid nodules - previously investigated before moving to [place] with sequential U/S's.  - thyroid ultrasound in 03/2014: several smaller nodules, however, with a dominant 2.9 cm nodule in the thyroid isthmus.   - FNA of this nodule in 07/2014: benign.  No neck compression sxs.   Son has terminal kidney cancer -  in [place] >> a lot of stress.   I reviewed pt's medications, allergies, PMH, social hx, family hx, and changes were documented in the history of present illness. Otherwise, unchanged from my initial visit note.  ROS: Constitutional: no weight gain/loss, no fatigue, no subjective hyperthermia/hypothermia Eyes: no blurry vision, no xerophthalmia ENT: no sore throat, no nodules palpated in throat, no dysphagia/odynophagia, no hoarseness Cardiovascular: no CP/SOB/palpitations/leg swelling Respiratory: no cough/SOB Gastrointestinal: no N/V/D/C Musculoskeletal:no muscle pain /joint aches Skin: no rashes Neurological: no tremors/numbness/tingling/dizziness  PE: BP 128/70 mmHg  Pulse 81  Temp(Src) 98.1 F (36.7  C) (Oral)  Resp 12  Wt 177 lb (80.287 kg)  SpO2 97% Body mass index is 33.46 kg/(m^2). Wt Readings from Last 3 Encounters:  05/31/15 177 lb (80.287 kg)  04/15/15 179 lb 9.6 oz (81.466 kg)  03/01/15 180 lb 9.6 oz (81.92 kg)   Constitutional: overweight, in NAD Eyes: PERRLA, EOMI, no exophthalmos ENT: moist mucous membranes, + B thyromegaly and large ant nodule palpated, no cervical lymphadenopathy Cardiovascular: RRR, No MRG Respiratory: CTA B Gastrointestinal: abdomen soft, NT, ND, BS+ Musculoskeletal: no deformities, strength intact in all 4 Skin: moist, warm, no rashes Neurological: no tremor with outstretched hands, DTR normal in all 4  ASSESSMENT: 1. DM2, insulin-dependent, uncontrolled, without complications  2. Thyroid nodules - bilaterally - daughter with thyroid ds. (? Type) - No FH of thyroid CA - FH of goiter on mother's side - Thyroid U/S (04/03/2014): Right thyroid lobe  Measurements: 4.6 cm x 1.9 cm x 1.5 cm. Several right-sided nodules, all of which are less than 1 cm.  Superior: 4 mm x 2 mm x 3 mm  Mid: 5 mm x 4 mm x 4 mm  Inferior: 5 mm x 3 mm x 4 mm  Left thyroid lobe  Measurements: 4.7 cm x 1.7 cm x 1.8 cm. Several left-sided thyroid nodules are present.  Dominant: 1.1 cm x 6 mm x 5 mm. This lesion is anechoic with posterior acoustic enhancement, compatible with cystic changes.  Mid: 8 mm x 5 mm x 6 mm  Mid: 5 mm x 3 mm x 4 mm.  Inferior: 1.0 cm x 8 mm by 6 mm. Inferior nodule is uniformly echoic with no calcifications.  Isthmus  Thickness: 1.5 cm. Dominant nodule of the thyroid is within the isthmus. This measures 2.3 cm x 1.5 cm x 2.9 cm with complex solid features.  Lymphadenopathy  None visualized.  IMPRESSION: Multinodular thyroid. Single nodule within the isthmus meets criteria for biopsy.              07/28/2014: FNA isthmic nodule: Adequacy Reason Satisfactory For Evaluation. Diagnosis THYROID, ISTHMUS,  FINE NEEDLE ASPIRATION (SPECIMEN 1 OF 1, COLLECTED ON 07/28/14): FINDINGS CONSISTENT WITH BENIGN THYROID NODULE (BETHESDA CATEGORY II). Willeen Niece MD Pathologist, Electronic Signature (Case signed 07/29/2014) Specimen Clinical Information Dominant nodule of the thyroid is within the isthmus, This measures 2.3 cm x 1.5 cm x 2.9 cm with complex solid  Benign results, will continue to follow.  3. HL - I repeatedly d/w the pt about her HL and recommended Ezetimibe, which she did not want to start - will not re-address  PLAN:  1. Patient with long-standing, uncontrolled diabetes, with multiple DM drugs intolerances, with worse control after starting knee Hyaluronic acid injections, although this is not a steroid...  Sugars worse since last visit despite increasing Lantus. She actually takes b/w 20-25 units >> advised to stay with 20 units. Will increase Glipizide. - Target for  her HbA1c <7.5%. - reviewed last HbA1c >> 8.0%. Will recheck today >> 8.7% (higher) - I suggested to:  Patient Instructions  Please continue: - Lantus 20 units at bedtime.  Please increase: - Glipizide XL to 10 mg in am. - crush 1 tablet of Glipizide XL 5 mg tablet before dinner.  Please work on your diet and reduce the doses to the previous regimen if sugars start to come down.  Please return in 3 months with your sugar log.   - continue checking sugars at different times of the day - check 2 times a day, rotating checks - advised for yearly eye exams >> she is up to date - refused flu shot - Return to clinic in 3 mo with sugar log   2. Thyromegaly and thyroid nodule - long h/o goiter; no compression sxs; followed by serial U/S's in the past >> unchanged; no compression sxs - no FH of ThyCA - last set of TFTs reviewed >> normal: Lab Results  Component Value Date   TSH 1.23 04/15/2015  - we checked a thyroid U/S 03/2014 >> large thyroid nodule in isthmus >> FNA was benign (07/2014)  - We'll continue to  follow this clinically, will U/S only if feels her nodule enlarging.

## 2015-06-07 DIAGNOSIS — H25812 Combined forms of age-related cataract, left eye: Secondary | ICD-10-CM | POA: Diagnosis not present

## 2015-06-07 DIAGNOSIS — H2512 Age-related nuclear cataract, left eye: Secondary | ICD-10-CM | POA: Diagnosis not present

## 2015-06-14 DIAGNOSIS — H2512 Age-related nuclear cataract, left eye: Secondary | ICD-10-CM | POA: Diagnosis not present

## 2015-06-15 ENCOUNTER — Encounter: Payer: Self-pay | Admitting: Family Medicine

## 2015-07-16 DIAGNOSIS — Z1231 Encounter for screening mammogram for malignant neoplasm of breast: Secondary | ICD-10-CM | POA: Diagnosis not present

## 2015-07-16 DIAGNOSIS — Z803 Family history of malignant neoplasm of breast: Secondary | ICD-10-CM | POA: Diagnosis not present

## 2015-07-29 ENCOUNTER — Encounter: Payer: Self-pay | Admitting: Family Medicine

## 2015-07-29 DIAGNOSIS — M1711 Unilateral primary osteoarthritis, right knee: Secondary | ICD-10-CM | POA: Diagnosis not present

## 2015-07-29 DIAGNOSIS — M1712 Unilateral primary osteoarthritis, left knee: Secondary | ICD-10-CM | POA: Diagnosis not present

## 2015-07-30 ENCOUNTER — Other Ambulatory Visit: Payer: Self-pay | Admitting: Orthopedic Surgery

## 2015-08-06 ENCOUNTER — Encounter: Payer: Self-pay | Admitting: Family Medicine

## 2015-08-16 ENCOUNTER — Encounter: Payer: Self-pay | Admitting: Family Medicine

## 2015-08-16 ENCOUNTER — Ambulatory Visit (INDEPENDENT_AMBULATORY_CARE_PROVIDER_SITE_OTHER): Payer: Medicare Other | Admitting: Family Medicine

## 2015-08-16 VITALS — BP 140/82 | HR 82 | Temp 97.7°F | Ht 61.0 in | Wt 174.6 lb

## 2015-08-16 DIAGNOSIS — M25561 Pain in right knee: Secondary | ICD-10-CM

## 2015-08-16 DIAGNOSIS — E1165 Type 2 diabetes mellitus with hyperglycemia: Secondary | ICD-10-CM | POA: Diagnosis not present

## 2015-08-16 DIAGNOSIS — Z01810 Encounter for preprocedural cardiovascular examination: Secondary | ICD-10-CM | POA: Diagnosis not present

## 2015-08-16 DIAGNOSIS — E785 Hyperlipidemia, unspecified: Secondary | ICD-10-CM | POA: Diagnosis not present

## 2015-08-16 DIAGNOSIS — M25562 Pain in left knee: Secondary | ICD-10-CM

## 2015-08-16 DIAGNOSIS — IMO0001 Reserved for inherently not codable concepts without codable children: Secondary | ICD-10-CM

## 2015-08-16 DIAGNOSIS — Z794 Long term (current) use of insulin: Secondary | ICD-10-CM | POA: Diagnosis not present

## 2015-08-16 LAB — CBC
HEMATOCRIT: 39.3 % (ref 36.0–46.0)
HEMOGLOBIN: 13.2 g/dL (ref 12.0–15.0)
MCHC: 33.7 g/dL (ref 30.0–36.0)
MCV: 89.9 fl (ref 78.0–100.0)
Platelets: 248 10*3/uL (ref 150.0–400.0)
RBC: 4.37 Mil/uL (ref 3.87–5.11)
RDW: 13.4 % (ref 11.5–15.5)
WBC: 5.3 10*3/uL (ref 4.0–10.5)

## 2015-08-16 LAB — COMPREHENSIVE METABOLIC PANEL
ALK PHOS: 49 U/L (ref 39–117)
ALT: 16 U/L (ref 0–35)
AST: 15 U/L (ref 0–37)
Albumin: 4.2 g/dL (ref 3.5–5.2)
BUN: 17 mg/dL (ref 6–23)
CO2: 24 meq/L (ref 19–32)
Calcium: 9.9 mg/dL (ref 8.4–10.5)
Chloride: 107 mEq/L (ref 96–112)
Creatinine, Ser: 0.86 mg/dL (ref 0.40–1.20)
GFR: 68.26 mL/min (ref >60.00)
GLUCOSE: 193 mg/dL — AB (ref 70–99)
POTASSIUM: 4.7 meq/L (ref 3.5–5.1)
SODIUM: 138 meq/L (ref 135–145)
Total Bilirubin: 0.5 mg/dL (ref 0.2–1.2)
Total Protein: 7.3 g/dL (ref 6.0–8.3)

## 2015-08-16 LAB — MICROALBUMIN / CREATININE URINE RATIO
CREATININE, U: 28.7 mg/dL
MICROALB/CREAT RATIO: 2.4 mg/g (ref 0.0–30.0)
Microalb, Ur: <0.7 mg/dL (ref 0.0–1.9)

## 2015-08-16 LAB — HEMOGLOBIN A1C: Hgb A1c MFr Bld: 8.5 % — ABNORMAL HIGH (ref 4.6–6.5)

## 2015-08-16 NOTE — Progress Notes (Signed)
Pre visit review using our clinic review tool, if applicable. No additional management support is needed unless otherwise documented below in the visit note. 

## 2015-08-16 NOTE — Progress Notes (Signed)
[place] at [facility] [address], [place], [place] [address] [phone]  Date:  08/16/2015   Name:  Wanda [name]   DOB:  1940-03-29   MRN:  [id number]  PCP:  Lamar Blinks, MD    Chief Complaint: Surgical Clearance (Pt here for surgical clearance. Scheduled for right knee replacement on 09/24/2015. )   History of Present Illness:  Wanda [name] is a 75 y.o. very pleasant female patient who presents with the following:  History of poorly controled DM, HTN, obesity, high cholesterol.  Here today for pre-op clearance, planning to have a right knee replacement in September.  She has severe arthritis in her knees.  Will have the left knee eventually also. She is looking forward to getting her knee operation done in hopes that she can be more active again  Normal EKG on her chart from 2013  Labs done in April of this year. She sees Dr. Cruzita Lederer for her DM management - she will see her in 2 weeks for a recheck.  She is still "having a hard time" with her blood sugars.   Lab Results  Component Value Date   HGBA1C 8.7 05/31/2015   A couple of weeks ago she noted an episode of feeling shaky and sweaty. She ate cereal and felt better. Did not check her glucose at that time  She is not aware of any heart history- no prior history of CVA, CAD, MI She did have a stress test in the distant past- 10 -15 years ago.  This was quite normal. She cannot recall why this was done anymore She does water exercise without any CP or SOB.  She is able to vacuum her house without fatigue.    Her on land tolerance is decreased due to her knee pain but she is able to do pool exercises for 45 minutes without difficulty.    Patient Active Problem List   Diagnosis Date Noted  . Multinodular goiter (nontoxic) 04/07/2014  . Diabetes mellitus type 2, uncontrolled, without complications ([place]) 52/77/8242  . Insulin adverse reaction 09/27/2013  . Senile nuclear  sclerosis 08/14/2012  . HTN, goal below 130/80 03/10/2011  . Hyperlipidemia with target LDL less than 70 03/10/2011  . Obesity (BMI 30.0-34.9) 03/10/2011  . Health care maintenance 03/10/2011    Past Medical History:  Diagnosis Date  . Arthritis   . Cataract   . Diabetes mellitus   . Hypertension     Past Surgical History:  Procedure Laterality Date  . ABDOMINAL HYSTERECTOMY  early 30's   total  . [place] and 1981   both hands  . TONSILLECTOMY AND ADENOIDECTOMY  age 57  . TUBAL LIGATION      Social History  Substance Use Topics  . Smoking status: Never Smoker  . Smokeless tobacco: Never Used  . Alcohol use 0.5 oz/week    1 drink(s) per week    Family History  Problem Relation Age of Onset  . Pancreatitis Father     deceased 62  . Cancer    . Cancer Brother     GI  . Cancer Mother     liver  . Cancer Son     terminal kidney    Allergies  Allergen Reactions  . Ace Inhibitors Swelling and Cough    Pt had cough and diarrhea with first few doses of medication; also had swelling of right eyelid. Stopped medication on 03/17/11.  Marland Kitchen  Alendronate Sodium Other (See Comments)    Whole body hurt  . Aspirin Hives  . Glucotrol [Glipizide] Other (See Comments)    Migraine "color flashes" in L eye with dizziness and lightheadedness.  . Lipitor [Atorvastatin Calcium] Other (See Comments)    Whole body hurt  . Metformin And Related     Severe abdominal pain    Medication list has been reviewed and updated.  Current Outpatient Prescriptions on File Prior to Visit  Medication Sig Dispense Refill  . Blood Glucose Monitoring Suppl (ONE TOUCH ULTRA MINI) w/Device KIT Use to test blood sugar daily as instructed. Dx: E11.65 1 each 0  . COD LIVER OIL PO Take by mouth. Reported on 05/31/2015    . glipiZIDE (GLIPIZIDE XL) 5 MG 24 hr tablet Take 2 tablets (10 mg total) by mouth daily with breakfast. And 1 crushed tablet before dinner. 270 tablet 1  . glucose blood  test strip Test blood sugar 3 times a day. Dx code: 250.00 100 each 12  . Insulin Glargine (LANTUS SOLOSTAR) 100 UNIT/ML Solostar Pen Inject 20 Units into the skin daily at 10 pm. 15 mL 5  . Insulin Pen Needle 32G X 4 MM MISC Use to inject insulin 1 time daily. 90 each 3  . Insulin Syringe-Needle U-100 (INSULIN SYRINGE .5CC/31GX5/16") 31G X 5/16" 0.5 ML MISC Use to inject insulin 1 time daily. 90 each 3  . niacin (NIASPAN) 500 MG CR tablet Take 1 tablet (500 mg total) by mouth at bedtime. Increase to 2 tablets after 1 month 180 tablet 2  . vitamin E 200 UNIT capsule Take 200 Units by mouth daily. Reported on 05/31/2015    . [DISCONTINUED] lisinopril (PRINIVIL,ZESTRIL) 20 MG tablet Take 20 mg by mouth daily.      No current facility-administered medications on file prior to visit.     Review of Systems:  As per HPI- otherwise negative.   Physical Examination: Vitals:   08/16/15 0948  BP: 140/82  Pulse: 82  Temp: 97.7 F (36.5 C)   Vitals:   08/16/15 0948  Weight: 174 lb 9.6 oz (79.2 kg)  Height: 5' 1"  (1.549 m)   Body mass index is 32.99 kg/m. Ideal Body Weight: Weight in (lb) to have BMI = 25: 132  GEN: WDWN, NAD, Non-toxic, A & O x 3, mild obesity, looks well HEENT: Atraumatic, Normocephalic. Neck supple. No masses, No LAD.  Bilateral TM wnl, oropharynx normal.  PEERL,EOMI.   Ears and Nose: No external deformity. CV: RRR, No M/G/R. No JVD. No thrill. No extra heart sounds. PULM: CTA B, no wheezes, crackles, rhonchi. No retractions. No resp. distress. No accessory muscle use. ABD: S, NT, ND, +BS. No rebound. No HSM. EXTR: No c/c/e NEURO Normal gait.  PSYCH: Normally interactive. Conversant. Not depressed or anxious appearing.  Calm demeanor.   EKG: NSR, no change from 2013  Assessment and Plan: Pre-operative cardiovascular examination - Plan: EKG 12-Lead, CBC, Comprehensive metabolic panel  Dyslipidemia  Uncontrolled type 2 diabetes mellitus without complication, with  long-term current use of insulin ([place]) - Plan: Hemoglobin A1c, Urine Microalbumin w/creat. ratio  Arthralgia of both knees  Await her labs but anticipate we will be able to clear her for surgery with Dr. Berenice Primas.  I will fax him a letter pending her labs   Signed Lamar Blinks, MD

## 2015-08-16 NOTE — Patient Instructions (Signed)
It was great to see you today- best of luck with your operation Assuming that your labs look ok we will clear you for your procedure.   Your EKG looks fine today

## 2015-08-20 ENCOUNTER — Encounter: Payer: Self-pay | Admitting: Family Medicine

## 2015-08-20 ENCOUNTER — Ambulatory Visit
Admission: RE | Admit: 2015-08-20 | Discharge: 2015-08-20 | Disposition: A | Discharge status: Home / Self Care | Payer: Medicare Other | Source: Ambulatory Visit | Attending: Family Medicine | Admitting: Family Medicine

## 2015-08-20 DIAGNOSIS — E2839 Other primary ovarian failure: Secondary | ICD-10-CM

## 2015-08-20 DIAGNOSIS — M810 Age-related osteoporosis without current pathological fracture: Secondary | ICD-10-CM | POA: Diagnosis not present

## 2015-08-20 DIAGNOSIS — Z780 Asymptomatic menopausal state: Secondary | ICD-10-CM | POA: Diagnosis not present

## 2015-08-23 ENCOUNTER — Encounter: Payer: Self-pay | Admitting: Family Medicine

## 2015-08-23 ENCOUNTER — Encounter: Payer: Self-pay | Admitting: Internal Medicine

## 2015-08-23 DIAGNOSIS — M810 Age-related osteoporosis without current pathological fracture: Secondary | ICD-10-CM | POA: Insufficient documentation

## 2015-08-23 NOTE — Progress Notes (Signed)
  To Whom It May Concern,  Patient is cleared for surgery from diabetes point of view.  Sincerely, Philemon Kingdom, MD PhD [facility] [address] [place], [place] [address] Tel. [phone] Fax [phone]  NPI: [id number] [place] license number: [id number], exp. 07/05/2015

## 2015-08-31 ENCOUNTER — Ambulatory Visit (INDEPENDENT_AMBULATORY_CARE_PROVIDER_SITE_OTHER): Payer: Medicare Other | Admitting: Internal Medicine

## 2015-08-31 ENCOUNTER — Encounter: Payer: Self-pay | Admitting: Internal Medicine

## 2015-08-31 VITALS — BP 132/78 | HR 84 | Ht 61.5 in | Wt 174.0 lb

## 2015-08-31 DIAGNOSIS — E042 Nontoxic multinodular goiter: Secondary | ICD-10-CM

## 2015-08-31 DIAGNOSIS — Z794 Long term (current) use of insulin: Secondary | ICD-10-CM | POA: Diagnosis not present

## 2015-08-31 DIAGNOSIS — E1165 Type 2 diabetes mellitus with hyperglycemia: Secondary | ICD-10-CM

## 2015-08-31 DIAGNOSIS — IMO0001 Reserved for inherently not codable concepts without codable children: Secondary | ICD-10-CM

## 2015-08-31 NOTE — Patient Instructions (Addendum)
Please increase Lantus to 18 units daily.  If sugars in am >130 in the next 2 weeks, then increase to 20 units.  If sugars in am >130 in the next 2 weeks, then increase to 22 units.   Please return in 3 months with your sugar log.

## 2015-08-31 NOTE — Progress Notes (Signed)
Patient ID: Wanda [name], female   DOB: [birth year], 75 y.o.   MRN: [id number]  HPI: Wanda [name] is a 75 y.o.-year-old female, returning for f/u for DM2, dx 2007, insulin-dependent since 07/2013, uncontrolled, without complications and also thyroid nodules. Last visit 3 mo ago. New PCP: Dr [name] Copland.  Last hemoglobin A1c was: Lab Results  Component Value Date   HGBA1C 8.5 (H) 08/16/2015   HGBA1C 8.7 05/31/2015   HGBA1C 8.0 03/01/2015  She has Shingles in 03/2013.  Pt is on: - Lantus 15-18 units at bedtime. She stopped Glipizide XL as she was tired, weak, foggy minded, diarrhea >> sxs better and sugars also better! She tried Metformin >> abdominal pain She tried Glymepiride >> stopped working She tried regular Glipizide >> dizziness She is afraid of Januvia b/c of possible pancreatic issues She started Levemir in 07/2013. She since stopped it (on 09/17/2013) as she had lower leg bone pain, blurred vision, mental fog.   Pt checks her sugars 2-3 a day - brought log >> after stopped Glipizide: - am: 104-129, 170 >> 120-167, 180 >> 126-167, 181, 203 >> 97, 146-187, 205, 218 >> 122-166 - 2h after b'fast: 128-154 >> 101-149, 194 >> 140s >> 127-168, 194 >> 143-172 >> 136-220 >> n/c - before lunch: 99-123, 147 >> 97-137, 160 >> 121-157, 175 >> 115-132, 216 >> 150-210 >> 158, 161 - 2h after lunch: 180, 275 >> n/c >> 90-158 >> n/c >> 122-154 >> 119-153, 210 >> 136-227 >> n/c - before dinner: 90-124, 180 >> 94, 102-159, 194 >> 112-145 >> 102-142 >> 105-226 >> n/c - 2h after dinner: 237, 270 >> n/c >> 227 >> n/c  - bedtime: 250, 256 >> 140 >> 167, 190 >> 231 x1 >> n/c >> 160-228, 323 >> 168-213 >> 245-330 >> 151, 152 - nighttime: n/c No lows. Lowest sugar was 102 >> 97 >> 122; ? If has hypoglycemia awareness.  Highest sugar was 213 >> 330 >> 200s.  Pt's meals are: - Breakfast: yoghurt + fruit; eggs + onion, peppers - Lunch: salad & sardines - Dinner: meat + veggies - Snacks: 2-3:  fruit, cheese, nuts  - no CKD, last BUN/creatinine:  Lab Results  Component Value Date   BUN 17 08/16/2015   CREATININE 0.86 08/16/2015   - last set of lipids: Lab Results  Component Value Date   CHOL 238 (H) 04/15/2015   HDL 42.10 04/15/2015   LDLCALC 166 (H) 03/31/2014   LDLDIRECT 156.0 04/15/2015   TRIG 206.0 (H) 04/15/2015   CHOLHDL 6 04/15/2015  She started statins >> "muscles frozen". She tried Fish oil. I suggested Zetia at last visits, but she did not want to start >> now on Niacin by PCP. - last eye exam was in 09/2014. No DR. + cataracts stage 1. She had R cataract sx on 05/24/2015 and L eye cataract sx on 06/07/2015. - no numbness and tingling in her feet. She has a podiatrist.  Of note, Sister and father died of pancreatitis. Mother died of liver CA (alcoholic).  She has a h/o goiter and thyroid nodules - previously investigated before moving to [place] with sequential U/S's.  - thyroid ultrasound in 03/2014: several smaller nodules, however, with a dominant 2.9 cm nodule in the thyroid isthmus.   - FNA of this nodule in 07/2014: benign.  No neck compression sxs.   Lab Results  Component Value Date   TSH 1.23 04/15/2015   TSH 1.26 03/31/2014   TSH 1.340 02/28/2013   TSH  1.423 04/03/2012   FREET4 0.81 03/31/2014   FREET4 1.02 02/28/2013   Son has terminal kidney cancer - in [place] >> a lot of stress.   I reviewed pt's medications, allergies, PMH, social hx, family hx, and changes were documented in the history of present illness. Otherwise, unchanged from my initial visit note.  ROS: Constitutional: no weight gain/loss, no fatigue, no subjective hyperthermia/hypothermia Eyes: no blurry vision, no xerophthalmia ENT: no sore throat, no nodules palpated in throat, no dysphagia/odynophagia, no hoarseness Cardiovascular: no CP/SOB/palpitations/leg swelling Respiratory: no cough/SOB Gastrointestinal: no N/V/D/C Musculoskeletal:no muscle pain /+ joint aches Skin: no  rashes Neurological: no tremors/numbness/tingling/dizziness  PE: BP 132/78 (BP Location: Left Arm, Patient Position: Sitting)   Pulse 84   Ht 5' 1.5" (1.562 m)   Wt 174 lb (78.9 kg)   SpO2 98%   BMI 32.34 kg/m  Body mass index is 32.34 kg/m. Wt Readings from Last 3 Encounters:  08/31/15 174 lb (78.9 kg)  08/16/15 174 lb 9.6 oz (79.2 kg)  05/31/15 177 lb (80.3 kg)   Constitutional: overweight, in NAD Eyes: PERRLA, EOMI, no exophthalmos ENT: moist mucous membranes, + B thyromegaly and large ant nodule palpated, no cervical lymphadenopathy Cardiovascular: RRR, No MRG Respiratory: CTA B Gastrointestinal: abdomen soft, NT, ND, BS+ Musculoskeletal: no deformities, strength intact in all 4 Skin: moist, warm, no rashes Neurological: no tremor with outstretched hands, DTR normal in all 4  ASSESSMENT: 1. DM2, insulin-dependent, uncontrolled, without complications  2. Thyroid nodules - bilaterally - daughter with thyroid ds. (? Type) - No FH of thyroid CA - FH of goiter on mother's side - Thyroid U/S (04/03/2014): Right thyroid lobe  Measurements: 4.6 cm x 1.9 cm x 1.5 cm. Several right-sided nodules, all of which are less than 1 cm.  Superior: 4 mm x 2 mm x 3 mm  Mid: 5 mm x 4 mm x 4 mm  Inferior: 5 mm x 3 mm x 4 mm  Left thyroid lobe  Measurements: 4.7 cm x 1.7 cm x 1.8 cm. Several left-sided thyroid nodules are present.  Dominant: 1.1 cm x 6 mm x 5 mm. This lesion is anechoic with posterior acoustic enhancement, compatible with cystic changes.  Mid: 8 mm x 5 mm x 6 mm  Mid: 5 mm x 3 mm x 4 mm.  Inferior: 1.0 cm x 8 mm by 6 mm. Inferior nodule is uniformly echoic with no calcifications.  Isthmus  Thickness: 1.5 cm. Dominant nodule of the thyroid is within the isthmus. This measures 2.3 cm x 1.5 cm x 2.9 cm with complex solid features.  Lymphadenopathy  None visualized.  IMPRESSION: Multinodular thyroid. Single nodule within the isthmus  meets criteria for biopsy.              07/28/2014: FNA isthmic nodule: Adequacy Reason Satisfactory For Evaluation. Diagnosis THYROID, ISTHMUS, FINE NEEDLE ASPIRATION (SPECIMEN 1 OF 1, COLLECTED ON 07/28/14): FINDINGS CONSISTENT WITH BENIGN THYROID NODULE ([place] CATEGORY II). Willeen Niece MD Pathologist, Electronic Signature (Case signed 07/29/2014) Specimen Clinical Information Dominant nodule of the thyroid is within the isthmus, This measures 2.3 cm x 1.5 cm x 2.9 cm with complex solid  Benign results, will continue to follow.  PLAN:  1. Patient with long-standing, uncontrolled diabetes, with multiple DM drugs intolerances, with slightly better control after stopping Glipizide 4 days ago. She is only on Lantus actually takes b/w 15-18 units >> advised to stay with 18 units, but increase slightly if sugars in am >130. - Target for  her HbA1c <7.5%. - reviewed last HbA1c >> 8.5% (slightly better) - I suggested to:  Patient Instructions  Please increase Lantus to 18 units daily.  If sugars in am >130 in the next 2 weeks, then increase to 20 units.  If sugars in am >130 in the next 2 weeks, then increase to 22 units.   Please return in 3 months with your sugar log.   - continue checking sugars at different times of the day - check 2 times a day, rotating checks - advised for yearly eye exams >> she is up to date - Return to clinic in 3 mo with sugar log   2. Thyromegaly and thyroid nodule - long h/o goiter; no compression sxs; followed by serial U/S's in the past >> unchanged; no compression sxs - no FH of ThyCA - last set of TFTs reviewed >> normal: Lab Results  Component Value Date   TSH 1.23 04/15/2015  - we checked a thyroid U/S 03/2014 >> large thyroid nodule in isthmus >> FNA was benign (07/2014)  - We'll continue to follow this clinically, will U/S only if feels her nodule enlarging.  Philemon Kingdom, MD PhD [facility]

## 2015-09-14 ENCOUNTER — Other Ambulatory Visit (HOSPITAL_COMMUNITY): Payer: Self-pay

## 2015-09-19 ENCOUNTER — Other Ambulatory Visit: Payer: Self-pay | Admitting: Internal Medicine

## 2015-09-23 NOTE — Pre-Procedure Instructions (Signed)
Wanda [name]  09/23/2015     Your procedure is scheduled on : Monday October 04, 2015 at 7:15 AM.  Report to [facility] Admitting at 5:30 AM.  Call this number if you have problems the morning of surgery: [phone]    Remember:  Do not eat food or drink liquids after midnight.  Take these medicines the morning of surgery with A SIP OF WATER : Acetaminophen (Tylenol) if needed   Stop taking any vitamins, herbal medications/supplements, NSAIDs, Ibuprofen, Advil, Motrin, Aleve, etc on Monday September 25th   How to Manage Your Diabetes Before and After Surgery  Why is it important to control my blood sugar before and after surgery? . Improving blood sugar levels before and after surgery helps healing and can limit problems. . A way of improving blood sugar control is eating a healthy diet by: o  Eating less sugar and carbohydrates o  Increasing activity/exercise o  Talking with your doctor about reaching your blood sugar goals . High blood sugars (greater than 180 mg/dL) can raise your risk of infections and slow your recovery, so you will need to focus on controlling your diabetes during the weeks before surgery. . Make sure that the doctor who takes care of your diabetes knows about your planned surgery including the date and location.  How do I manage my blood sugar before surgery? . Check your blood sugar at least 4 times a day, starting 2 days before surgery, to make sure that the level is not too high or low. o Check your blood sugar the morning of your surgery when you wake up and every 2 hours until you get to the Short Stay unit. . If your blood sugar is less than 70 mg/dL, you will need to treat for low blood sugar: o Do not take insulin. o Treat a low blood sugar (less than 70 mg/dL) with  cup of clear juice (cranberry or apple), 4 glucose tablets, OR glucose gel. o Recheck blood sugar in 15 minutes after treatment (to make sure it is greater than 70 mg/dL).  If your blood sugar is not greater than 70 mg/dL on recheck, call [phone] for further instructions. . Report your blood sugar to the short stay nurse when you get to Short Stay.  . If you are admitted to the hospital after surgery: o Your blood sugar will be checked by the staff and you will probably be given insulin after surgery (instead of oral diabetes medicines) to make sure you have good blood sugar levels. o The goal for blood sugar control after surgery is 80-180 mg/dL.      WHAT DO I DO ABOUT MY DIABETES MEDICATION?  Marland Kitchen Do not take oral diabetes medicines (pills) the morning of surgery.  . THE NIGHT BEFORE SURGERY, take 7-10 units of Lantus insulin.     Reviewed and Endorsed by [facility] Patient Education Committee, August 2015   Do not wear jewelry, make-up or nail polish.  Do not wear lotions, powders, or perfumes, or deoderant.  Do not shave 48 hours prior to surgery.    Do not bring valuables to the hospital.  [facility] is not responsible for any belongings or valuables.  Contacts, dentures or bridgework may not be worn into surgery.  Leave your suitcase in the car.  After surgery it may be brought to your room.  For patients admitted to the hospital, discharge time will be determined by your treatment team.  Patients discharged the day  of surgery will not be allowed to drive home.   Name and phone number of your driver:    Special instructions:  Shower using CHG soap the night before and the morning of your surgery  Please read over the following fact sheets that you were given. Total Joint Packet and MRSA Information

## 2015-09-24 ENCOUNTER — Ambulatory Visit (HOSPITAL_COMMUNITY)
Admission: RE | Admit: 2015-09-24 | Discharge: 2015-09-24 | Disposition: A | Discharge status: Home / Self Care | Payer: Medicare Other | Source: Ambulatory Visit | Attending: Orthopedic Surgery | Admitting: Orthopedic Surgery

## 2015-09-24 ENCOUNTER — Encounter (HOSPITAL_COMMUNITY): Payer: Self-pay | Admitting: Surgery

## 2015-09-24 ENCOUNTER — Encounter (HOSPITAL_COMMUNITY)
Admission: RE | Admit: 2015-09-24 | Discharge: 2015-09-24 | Disposition: A | Discharge status: Home / Self Care | Payer: Medicare Other | Source: Ambulatory Visit | Attending: Orthopedic Surgery | Admitting: Orthopedic Surgery

## 2015-09-24 DIAGNOSIS — Z01818 Encounter for other preprocedural examination: Secondary | ICD-10-CM | POA: Insufficient documentation

## 2015-09-24 DIAGNOSIS — Z471 Aftercare following joint replacement surgery: Secondary | ICD-10-CM | POA: Diagnosis not present

## 2015-09-24 DIAGNOSIS — Z96651 Presence of right artificial knee joint: Secondary | ICD-10-CM | POA: Diagnosis not present

## 2015-09-24 HISTORY — DX: Unspecified urinary incontinence: R32

## 2015-09-24 HISTORY — DX: Unspecified malignant neoplasm of skin, unspecified: C44.90

## 2015-09-24 HISTORY — DX: Urgency of urination: R39.15

## 2015-09-24 LAB — URINALYSIS, ROUTINE W REFLEX MICROSCOPIC
Bilirubin Urine: NEGATIVE
GLUCOSE, UA: 250 mg/dL — AB
Hgb urine dipstick: NEGATIVE
Ketones, ur: NEGATIVE mg/dL
LEUKOCYTES UA: NEGATIVE
Nitrite: NEGATIVE
PROTEIN: NEGATIVE mg/dL
SPECIFIC GRAVITY, URINE: 1.008 (ref 1.005–1.030)
pH: 6 (ref 5.0–8.0)

## 2015-09-24 LAB — COMPREHENSIVE METABOLIC PANEL
ALK PHOS: 54 U/L (ref 38–126)
ALT: 22 U/L (ref 14–54)
ANION GAP: 7 (ref 5–15)
AST: 21 U/L (ref 15–41)
Albumin: 4.1 g/dL (ref 3.5–5.0)
BILIRUBIN TOTAL: 0.7 mg/dL (ref 0.3–1.2)
BUN: 10 mg/dL (ref 6–20)
CALCIUM: 10 mg/dL (ref 8.9–10.3)
CO2: 26 mmol/L (ref 22–32)
CREATININE: 0.87 mg/dL (ref 0.44–1.00)
Chloride: 106 mmol/L (ref 101–111)
Glucose, Bld: 281 mg/dL — ABNORMAL HIGH (ref 65–99)
Potassium: 3.8 mmol/L (ref 3.5–5.1)
Sodium: 139 mmol/L (ref 135–145)
TOTAL PROTEIN: 7.4 g/dL (ref 6.5–8.1)

## 2015-09-24 LAB — GLUCOSE, CAPILLARY: GLUCOSE-CAPILLARY: 402 mg/dL — AB (ref 65–99)

## 2015-09-24 LAB — CBC WITH DIFFERENTIAL/PLATELET
Basophils Absolute: 0 10*3/uL (ref 0.0–0.1)
Basophils Relative: 1 %
EOS ABS: 0 10*3/uL (ref 0.0–0.7)
Eosinophils Relative: 1 %
HEMATOCRIT: 39.1 % (ref 36.0–46.0)
HEMOGLOBIN: 13 g/dL (ref 12.0–15.0)
LYMPHS ABS: 2.2 10*3/uL (ref 0.7–4.0)
LYMPHS PCT: 37 %
MCH: 30 pg (ref 26.0–34.0)
MCHC: 33.2 g/dL (ref 30.0–36.0)
MCV: 90.1 fL (ref 78.0–100.0)
Monocytes Absolute: 0.4 10*3/uL (ref 0.1–1.0)
Monocytes Relative: 7 %
NEUTROS PCT: 54 %
Neutro Abs: 3.2 10*3/uL (ref 1.7–7.7)
Platelets: 253 10*3/uL (ref 150–400)
RBC: 4.34 MIL/uL (ref 3.87–5.11)
RDW: 12.8 % (ref 11.5–15.5)
WBC: 6 10*3/uL (ref 4.0–10.5)

## 2015-09-24 LAB — PROTIME-INR
INR: 0.96
Prothrombin Time: 12.7 seconds (ref 11.4–15.2)

## 2015-09-24 LAB — TYPE AND SCREEN
ABO/RH(D): B POS
Antibody Screen: NEGATIVE

## 2015-09-24 LAB — SURGICAL PCR SCREEN
MRSA, PCR: NEGATIVE
Staphylococcus aureus: NEGATIVE

## 2015-09-24 LAB — APTT: aPTT: 25 seconds (ref 24–36)

## 2015-09-24 LAB — ABO/RH: ABO/RH(D): B POS

## 2015-09-24 NOTE — Progress Notes (Signed)
PCP is Jessica Copland  Dr. Philemon Kingdom handles patients diabetes. CBG on arrival to PAT was 402. Patient stated she consumed some strawberry yogurt at 1220 and ate some cantaloupe at 0900. Fasting blood glucose typically ranges from 147-353. Last A1c in EPIC was 8.5 a month ago.   Patient denied having any acute cardiac or pulmonary issues.  Patients BP was 196/84 and patient stated "it's high cause I just got off of [address]." BP was rechecked manually and it was 180/92  Nurse called Ebony Hail, PA and informed her of this time.   Nurse measured patients left leg for a TED hose, and patient stated that she could not stand having any thing tight on her legs, nor did she like massages. Nurse explained to patient the importance of blood clot prevention. Patient verbalized understanding and stated she would try it, but she would let nursing staff know if she "couldn't stand" the TED hose.

## 2015-09-27 NOTE — Progress Notes (Signed)
Anesthesia Chart Review: Patient is a 75 year old female scheduled for right TKA on 10/04/15 by Dr. Berenice Primas.   History includes DM2, never smoker, hypertension, skin cancer, hysterectomy, T&A.   PCP is Dr. Lamar Blinks, last visit 08/16/15 for surgical clearance. She felt benefits of TKR outweighed risks and patient could proceed with planned surgery. See Letter under Letters tab.   Endocrinologist is Dr. Philemon Kingdom, last seen on 08/31/15. Her plan included:  Target for her HbA1c <7.5%. - reviewed last HbA1c >> 8.5% (slightly better) - I suggested to:  Patient Instructions  Please increase Lantus to 18 units daily.  If sugars in am >130 in the next 2 weeks, then increase to 20 units.  If sugars in am >130 in the next 2 weeks, then increase to 22 units.  Please return in 3 months with your sugar log.   Meds include cod liver oil, Lantus, niacin, vitamin E.  BP (!) 180/92 Comment: Manual recheck  Pulse 98   Temp 36.9 C   Resp 20   Ht 5' (1.524 m)   Wt 173 lb (78.5 kg)   SpO2 100%   BMI 33.79 kg/m  BP 196/84 on arrival, 180/92 on recheck. She is not on any anti-hypertensive agents. (Comparison BP 08/31/15 BP 132/78, 08/16/15 140/82, 05/31/15 128/70.) She thought BP was up because she just got through heavy traffic off of [facility] and because she was horrified that her CBG was > 400. She monitors her BP at home, and readings have been back around her baseline--this morning was 135/"70s". She also monitors her CBG at home. Dr. Cruzita Lederer has given her instructions on how to adjust her insulin based on readings. She reported that fasting CBG this morning was 129.   08/16/15 EKG: SR.  09/24/15 CXR: IMPRESSION: No active cardiopulmonary disease.  Preoperative labs noted. CBC, PT/PTT,  And CMET WNL except for non-fasting glucose 281. (CBG on arrival was 402 after eating cantaloupe, two large dates, and strawberry yogurt prior to preadmission appointment.) Her A1c was 8.5 (down from 8.7) on  08/16/15. She reports compliance with Dr. Arman Filter insulin instructions (see above).  I left a voice message with Elmyra Ricks at Dr. Berenice Primas' office regarding patient's PAT BP and CBG and also regarding improved results when I followed up with her this afternoon. Patient will get vitals and a fasting CBG on arrival. If both acceptable then I anticipate that she can proceed as planned.   George [facility] Short Stay Center/Anesthesiology Phone [phone] 09/27/2015 3:45 PM

## 2015-10-03 NOTE — H&P (Addendum)
TOTAL KNEE ADMISSION H&P  Patient is being admitted for right total knee arthroplasty.  Subjective:  Chief Complaint:right knee pain.  HPI: Wanda [name], 75 y.o. female, has a history of pain and functional disability in the right knee due to arthritis and has failed non-surgical conservative treatments for greater than 12 weeks to includeNSAID's and/or analgesics, corticosteriod injections, supervised PT with diminished ADL's post treatment, use of assistive devices, weight reduction as appropriate and activity modification.  Onset of symptoms was gradual, starting 4 years ago with gradually worsening course since that time. The patient noted no past surgery on the right knee(s).  Patient currently rates pain in the right knee(s) at 9 out of 10 with activity. Patient has night pain, worsening of pain with activity and weight bearing, pain that interferes with activities of daily living, pain with passive range of motion and joint swelling.  Patient has evidence of subchondral sclerosis, periarticular osteophytes and joint space narrowing by imaging studies. This patient has had ffailure of all reasonable conservative care. There is no active infection.  Patient Active Problem List   Diagnosis Date Noted  . Osteoporosis 08/23/2015  . Multinodular goiter (nontoxic) 04/07/2014  . Diabetes mellitus type 2, uncontrolled, without complications ([place]) 16/57/9038  . Insulin adverse reaction 09/27/2013  . Senile nuclear sclerosis 08/14/2012  . HTN, goal below 130/80 03/10/2011  . Hyperlipidemia with target LDL less than 70 03/10/2011  . Obesity (BMI 30.0-34.9) 03/10/2011  . Health care maintenance 03/10/2011   Past Medical History:  Diagnosis Date  . Arthritis   . Cataract   . Diabetes mellitus   . Hypertension   . Skin cancer    Removed from face  . Urgency of urination   . Urinary leakage     Past Surgical History:  Procedure Laterality Date  . ABDOMINAL HYSTERECTOMY  early 80's   total   . CARPAL TUNNEL RELEASE Bilateral 1980 and 1981   both hands  . Fatty Tumor Excision    . TONSILLECTOMY AND ADENOIDECTOMY  age 79  . TUBAL LIGATION      No prescriptions prior to admission.   Allergies  Allergen Reactions  . Ace Inhibitors Swelling and Cough    Pt had cough and diarrhea with first few doses of medication; also had swelling of right eyelid. Stopped medication on 03/17/11.  . Alendronate Sodium Other (See Comments)    Whole body hurt  . Aspirin Hives  . Glucotrol [Glipizide] Other (See Comments)    Migraine "color flashes" in L eye with dizziness and lightheadedness.  . Lipitor [Atorvastatin Calcium] Other (See Comments)    Whole body hurt  . Metformin And Related     Severe abdominal pain    Social History  Substance Use Topics  . Smoking status: Never Smoker  . Smokeless tobacco: Never Used  . Alcohol use 0.5 oz/week    1 drink(s) per week    Family History  Problem Relation Age of Onset  . Pancreatitis Father     deceased 69  . Cancer    . Cancer Brother     GI  . Cancer Mother     liver  . Cancer Son     terminal kidney     ROS ROS: I have reviewed the patient's review of systems thoroughly and there are no positive responses as relates to the HPI. Objective:  Physical Exam  Vital signs in last 24 hours:    Vitals:   10/04/15 0626  BP: (!) 204/100  Pulse: 80  Resp: 20  Temp: 98.2 F (36.8 C)   Well-developed well-nourished patient in no acute distress. Alert and oriented x3 HEENT:within normal limits Cardiac: Regular rate and rhythm Pulmonary: Lungs clear to auscultation Abdomen: Soft and nontender.  Normal active bowel sounds  Musculoskeletal: (right leg: Limited range of motion.  No instability.  Pain and grinding through range of motion.  Trace effusion.  Neurovascularly intact distally. Labs: Recent Results (from the past 2160 hour(s))  CBC     Status: None   Collection Time: 08/16/15 10:24 AM  Result Value Ref Range   WBC  5.3 4.0 - 10.5 K/uL   RBC 4.37 3.87 - 5.11 Mil/uL   Platelets 248.0 150.0 - 400.0 K/uL   Hemoglobin 13.2 12.0 - 15.0 g/dL   HCT 39.3 36.0 - 46.0 %   MCV 89.9 78.0 - 100.0 fl   MCHC 33.7 30.0 - 36.0 g/dL   RDW 13.4 11.5 - 15.5 %  Comprehensive metabolic panel     Status: Abnormal   Collection Time: 08/16/15 10:24 AM  Result Value Ref Range   Sodium 138 135 - 145 mEq/L   Potassium 4.7 3.5 - 5.1 mEq/L   Chloride 107 96 - 112 mEq/L   CO2 24 19 - 32 mEq/L   Glucose, Bld 193 (H) 70 - 99 mg/dL   BUN 17 6 - 23 mg/dL   Creatinine, Ser 0.86 0.40 - 1.20 mg/dL   Total Bilirubin 0.5 0.2 - 1.2 mg/dL   Alkaline Phosphatase 49 39 - 117 U/L   AST 15 0 - 37 U/L   ALT 16 0 - 35 U/L   Total Protein 7.3 6.0 - 8.3 g/dL   Albumin 4.2 3.5 - 5.2 g/dL   Calcium 9.9 8.4 - 10.5 mg/dL   GFR 68.26 >60.00 mL/min  Hemoglobin A1c     Status: Abnormal   Collection Time: 08/16/15 10:24 AM  Result Value Ref Range   Hgb A1c MFr Bld 8.5 (H) 4.6 - 6.5 %    Comment: Glycemic Control Guidelines for People with Diabetes:Non Diabetic:  <6%Goal of Therapy: <7%Additional Action Suggested:  >8%   Urine Microalbumin w/creat. ratio     Status: None   Collection Time: 08/16/15 10:24 AM  Result Value Ref Range   Microalb, Ur <0.7 0.0 - 1.9 mg/dL   Creatinine,U 28.7 mg/dL   Microalb Creat Ratio 2.4 0.0 - 30.0 mg/g  Glucose, capillary     Status: Abnormal   Collection Time: 09/24/15  1:05 PM  Result Value Ref Range   Glucose-Capillary 402 (H) 65 - 99 mg/dL  APTT     Status: None   Collection Time: 09/24/15  1:48 PM  Result Value Ref Range   aPTT 25 24 - 36 seconds  CBC WITH DIFFERENTIAL     Status: None   Collection Time: 09/24/15  1:48 PM  Result Value Ref Range   WBC 6.0 4.0 - 10.5 K/uL   RBC 4.34 3.87 - 5.11 MIL/uL   Hemoglobin 13.0 12.0 - 15.0 g/dL   HCT 39.1 36.0 - 46.0 %   MCV 90.1 78.0 - 100.0 fL   MCH 30.0 26.0 - 34.0 pg   MCHC 33.2 30.0 - 36.0 g/dL   RDW 12.8 11.5 - 15.5 %   Platelets 253 150 - 400 K/uL    Neutrophils Relative % 54 %   Neutro Abs 3.2 1.7 - 7.7 K/uL   Lymphocytes Relative 37 %   Lymphs Abs 2.2 0.7 - 4.0 K/uL  Monocytes Relative 7 %   Monocytes Absolute 0.4 0.1 - 1.0 K/uL   Eosinophils Relative 1 %   Eosinophils Absolute 0.0 0.0 - 0.7 K/uL   Basophils Relative 1 %   Basophils Absolute 0.0 0.0 - 0.1 K/uL  Comprehensive metabolic panel     Status: Abnormal   Collection Time: 09/24/15  1:48 PM  Result Value Ref Range   Sodium 139 135 - 145 mmol/L   Potassium 3.8 3.5 - 5.1 mmol/L   Chloride 106 101 - 111 mmol/L   CO2 26 22 - 32 mmol/L   Glucose, Bld 281 (H) 65 - 99 mg/dL   BUN 10 6 - 20 mg/dL   Creatinine, Ser 0.87 0.44 - 1.00 mg/dL   Calcium 10.0 8.9 - 10.3 mg/dL   Total Protein 7.4 6.5 - 8.1 g/dL   Albumin 4.1 3.5 - 5.0 g/dL   AST 21 15 - 41 U/L   ALT 22 14 - 54 U/L   Alkaline Phosphatase 54 38 - 126 U/L   Total Bilirubin 0.7 0.3 - 1.2 mg/dL   GFR calc non Af Amer >60 >60 mL/min   GFR calc Af Amer >60 >60 mL/min    Comment: (NOTE) The eGFR has been calculated using the CKD EPI equation. This calculation has not been validated in all clinical situations. eGFR's persistently <60 mL/min signify possible Chronic Kidney Disease.    Anion gap 7 5 - 15  Protime-INR     Status: None   Collection Time: 09/24/15  1:48 PM  Result Value Ref Range   Prothrombin Time 12.7 11.4 - 15.2 seconds   INR 0.96   Surgical pcr screen     Status: None   Collection Time: 09/24/15  1:51 PM  Result Value Ref Range   MRSA, PCR NEGATIVE NEGATIVE   Staphylococcus aureus NEGATIVE NEGATIVE    Comment:        The Xpert SA Assay (FDA approved for NASAL specimens in patients over 49 years of age), is one component of a comprehensive surveillance program.  Test performance has been validated by [facility] for patients greater than or equal to 47 year old. It is not intended to diagnose infection nor to guide or monitor treatment.   Urinalysis, Routine w reflex microscopic (not  at [facility])     Status: Abnormal   Collection Time: 09/24/15  1:51 PM  Result Value Ref Range   Color, Urine YELLOW YELLOW   APPearance CLEAR CLEAR   Specific Gravity, Urine 1.008 1.005 - 1.030   pH 6.0 5.0 - 8.0   Glucose, UA 250 (A) NEGATIVE mg/dL   Hgb urine dipstick NEGATIVE NEGATIVE   Bilirubin Urine NEGATIVE NEGATIVE   Ketones, ur NEGATIVE NEGATIVE mg/dL   Protein, ur NEGATIVE NEGATIVE mg/dL   Nitrite NEGATIVE NEGATIVE   Leukocytes, UA NEGATIVE NEGATIVE    Comment: MICROSCOPIC NOT DONE ON URINES WITH NEGATIVE PROTEIN, BLOOD, LEUKOCYTES, NITRITE, OR GLUCOSE <1000 mg/dL.  Type and screen Order type and screen if day of surgery is less than 15 days from draw of preadmission visit or order morning of surgery if day of surgery is greater than 6 days from preadmission visit.     Status: None   Collection Time: 09/24/15  1:53 PM  Result Value Ref Range   ABO/RH(D) B POS    Antibody Screen NEG    Sample Expiration 10/08/2015    Extend sample reason NO TRANSFUSIONS OR PREGNANCY IN THE PAST 3 MONTHS   ABO/Rh  Status: None   Collection Time: 09/24/15  1:53 PM  Result Value Ref Range   ABO/RH(D) B POS     Estimated body mass index is 33.79 kg/m as calculated from the following:   Height as of 09/24/15: 5' (1.524 m).   Weight as of 09/24/15: 78.5 kg (173 lb).   Imaging Review Plain radiographs demonstrate severe degenerative joint disease of the right knee(s). The overall alignment ismild varus. The bone quality appears to be fair for age and reported activity level.  Assessment/Plan:  End stage arthritis, right knee   The patient history, physical examination, clinical judgment of the provider and imaging studies are consistent with end stage degenerative joint disease of the right knee(s) and total knee arthroplasty is deemed medically necessary. The treatment options including medical management, injection therapy arthroscopy and arthroplasty were discussed at length. The risks and  benefits of total knee arthroplasty were presented and reviewed. The risks due to aseptic loosening, infection, stiffness, patella tracking problems, thromboembolic complications and other imponderables were discussed. The patient acknowledged the explanation, agreed to proceed with the plan and consent was signed. Patient is being admitted for inpatient treatment for surgery, pain control, PT, OT, prophylactic antibiotics, VTE prophylaxis, progressive ambulation and ADL's and discharge planning. The patient is planning to be discharged home with home health services

## 2015-10-03 NOTE — Anesthesia Preprocedure Evaluation (Addendum)
Anesthesia Evaluation  Patient identified by MRN, date of birth, ID band Patient awake    Reviewed: Allergy & Precautions, H&P , NPO status , Patient's Chart, lab work & pertinent test results  Airway Mallampati: I  TM Distance: >3 FB Neck ROM: Full    Dental no notable dental hx. (+) Teeth Intact, Dental Advisory Given   Pulmonary neg pulmonary ROS,    Pulmonary exam normal breath sounds clear to auscultation       Cardiovascular hypertension, Pt. on medications  Rhythm:Regular Rate:Normal     Neuro/Psych negative neurological ROS  negative psych ROS   GI/Hepatic negative GI ROS, Neg liver ROS,   Endo/Other  diabetes, Insulin Dependent  Renal/GU negative Renal ROS  negative genitourinary   Musculoskeletal  (+) Arthritis , Osteoarthritis,    Abdominal   Peds  Hematology negative hematology ROS (+)   Anesthesia Other Findings   Reproductive/Obstetrics negative OB ROS                           Anesthesia Physical Anesthesia Plan  ASA: III  Anesthesia Plan: MAC and Spinal   Post-op Pain Management:  Regional for Post-op pain   Induction: Intravenous  Airway Management Planned: Simple Face Mask  Additional Equipment:   Intra-op Plan:   Post-operative Plan: Extubation in OR  Informed Consent: I have reviewed the patients History and Physical, chart, labs and discussed the procedure including the risks, benefits and alternatives for the proposed anesthesia with the patient or authorized representative who has indicated his/her understanding and acceptance.   Dental advisory given  Plan Discussed with: CRNA  Anesthesia Plan Comments:         Anesthesia Quick Evaluation

## 2015-10-04 ENCOUNTER — Inpatient Hospital Stay (HOSPITAL_COMMUNITY): Payer: Medicare Other | Admitting: Anesthesiology

## 2015-10-04 ENCOUNTER — Encounter (HOSPITAL_COMMUNITY): Payer: Self-pay

## 2015-10-04 ENCOUNTER — Inpatient Hospital Stay (HOSPITAL_COMMUNITY)
Admission: RE | Admit: 2015-10-04 | Discharge: 2015-10-06 | DRG: 470 | Disposition: A | Discharge status: Skilled Nursing Facility | Payer: Medicare Other | Source: Ambulatory Visit | Attending: Orthopedic Surgery | Admitting: Orthopedic Surgery

## 2015-10-04 ENCOUNTER — Inpatient Hospital Stay (HOSPITAL_COMMUNITY): Payer: Medicare Other | Admitting: Vascular Surgery

## 2015-10-04 ENCOUNTER — Encounter (HOSPITAL_COMMUNITY)
Admission: RE | Disposition: A | Discharge status: Skilled Nursing Facility | Payer: Self-pay | Source: Ambulatory Visit | Attending: Orthopedic Surgery

## 2015-10-04 DIAGNOSIS — M810 Age-related osteoporosis without current pathological fracture: Secondary | ICD-10-CM | POA: Diagnosis present

## 2015-10-04 DIAGNOSIS — Z6830 Body mass index (BMI) 30.0-30.9, adult: Secondary | ICD-10-CM | POA: Diagnosis not present

## 2015-10-04 DIAGNOSIS — Z8051 Family history of malignant neoplasm of kidney: Secondary | ICD-10-CM | POA: Diagnosis not present

## 2015-10-04 DIAGNOSIS — Z888 Allergy status to other drugs, medicaments and biological substances status: Secondary | ICD-10-CM

## 2015-10-04 DIAGNOSIS — Z886 Allergy status to analgesic agent status: Secondary | ICD-10-CM | POA: Diagnosis not present

## 2015-10-04 DIAGNOSIS — Z800 Family history of malignant neoplasm of digestive organs: Secondary | ICD-10-CM | POA: Diagnosis not present

## 2015-10-04 DIAGNOSIS — E119 Type 2 diabetes mellitus without complications: Secondary | ICD-10-CM | POA: Diagnosis present

## 2015-10-04 DIAGNOSIS — E669 Obesity, unspecified: Secondary | ICD-10-CM | POA: Diagnosis present

## 2015-10-04 DIAGNOSIS — Z85828 Personal history of other malignant neoplasm of skin: Secondary | ICD-10-CM | POA: Diagnosis not present

## 2015-10-04 DIAGNOSIS — E785 Hyperlipidemia, unspecified: Secondary | ICD-10-CM | POA: Diagnosis present

## 2015-10-04 DIAGNOSIS — M1712 Unilateral primary osteoarthritis, left knee: Secondary | ICD-10-CM | POA: Diagnosis present

## 2015-10-04 DIAGNOSIS — M1711 Unilateral primary osteoarthritis, right knee: Secondary | ICD-10-CM | POA: Diagnosis present

## 2015-10-04 DIAGNOSIS — Z96651 Presence of right artificial knee joint: Secondary | ICD-10-CM | POA: Diagnosis not present

## 2015-10-04 DIAGNOSIS — G8918 Other acute postprocedural pain: Secondary | ICD-10-CM | POA: Diagnosis not present

## 2015-10-04 DIAGNOSIS — R531 Weakness: Secondary | ICD-10-CM | POA: Diagnosis not present

## 2015-10-04 DIAGNOSIS — M25561 Pain in right knee: Secondary | ICD-10-CM | POA: Diagnosis not present

## 2015-10-04 DIAGNOSIS — E042 Nontoxic multinodular goiter: Secondary | ICD-10-CM | POA: Diagnosis not present

## 2015-10-04 DIAGNOSIS — M179 Osteoarthritis of knee, unspecified: Secondary | ICD-10-CM | POA: Diagnosis not present

## 2015-10-04 DIAGNOSIS — H269 Unspecified cataract: Secondary | ICD-10-CM | POA: Diagnosis present

## 2015-10-04 DIAGNOSIS — I10 Essential (primary) hypertension: Secondary | ICD-10-CM | POA: Diagnosis present

## 2015-10-04 DIAGNOSIS — S86191A Other injury of other muscle(s) and tendon(s) of posterior muscle group at lower leg level, right leg, initial encounter: Secondary | ICD-10-CM | POA: Diagnosis not present

## 2015-10-04 DIAGNOSIS — N3941 Urge incontinence: Secondary | ICD-10-CM | POA: Diagnosis not present

## 2015-10-04 DIAGNOSIS — Z471 Aftercare following joint replacement surgery: Secondary | ICD-10-CM | POA: Diagnosis not present

## 2015-10-04 HISTORY — PX: TOTAL KNEE ARTHROPLASTY: SHX125

## 2015-10-04 LAB — GLUCOSE, CAPILLARY
GLUCOSE-CAPILLARY: 111 mg/dL — AB (ref 65–99)
GLUCOSE-CAPILLARY: 227 mg/dL — AB (ref 65–99)
GLUCOSE-CAPILLARY: 259 mg/dL — AB (ref 65–99)
Glucose-Capillary: 166 mg/dL — ABNORMAL HIGH (ref 65–99)
Glucose-Capillary: 154 mg/dL — ABNORMAL HIGH (ref 65–99)

## 2015-10-04 SURGERY — ARTHROPLASTY, KNEE, TOTAL
Anesthesia: Monitor Anesthesia Care | Site: Knee | Laterality: Right

## 2015-10-04 MED ORDER — APIXABAN 2.5 MG PO TABS: 2.5000 mg | ORAL_TABLET | Freq: Two times a day (BID) | ORAL | 0 refills | Status: DC

## 2015-10-04 MED ORDER — ZOLPIDEM TARTRATE 5 MG PO TABS: 5.0000 mg | ORAL_TABLET | Freq: Every evening | ORAL | Status: DC | PRN

## 2015-10-04 MED ORDER — INSULIN PEN NEEDLE 32G X 4 MM MISC: Freq: Once | Status: DC

## 2015-10-04 MED ORDER — DIPHENHYDRAMINE HCL 12.5 MG/5ML PO ELIX: 12.5000 mg | ORAL_SOLUTION | ORAL | Status: DC | PRN

## 2015-10-04 MED ORDER — BUPIVACAINE IN DEXTROSE 0.75-8.25 % IT SOLN
INTRATHECAL | Status: DC | PRN
  Administered 2015-10-04: 13.5 mg via INTRATHECAL

## 2015-10-04 MED ORDER — OXYCODONE-ACETAMINOPHEN 5-325 MG PO TABS: 1.0000 | ORAL_TABLET | Freq: Four times a day (QID) | ORAL | 0 refills | Status: DC | PRN

## 2015-10-04 MED ORDER — INSULIN GLARGINE 100 UNIT/ML ~~LOC~~ SOLN
15.0000 [IU] | Freq: Every day | SUBCUTANEOUS | Status: DC
Start: 2015-10-04 — End: 2015-10-06
  Administered 2015-10-04 – 2015-10-05 (×2): 15 [IU] via SUBCUTANEOUS
  Filled 2015-10-04 (×3): qty 0.15

## 2015-10-04 MED ORDER — TRANEXAMIC ACID 1000 MG/10ML IV SOLN
1000.0000 mg | Freq: Once | INTRAVENOUS | Status: AC
  Administered 2015-10-04: 1000 mg via INTRAVENOUS
  Filled 2015-10-04 (×2): qty 10

## 2015-10-04 MED ORDER — PHENYLEPHRINE HCL 10 MG/ML IJ SOLN
INTRAVENOUS | Status: DC | PRN
  Administered 2015-10-04: 25 ug/min via INTRAVENOUS

## 2015-10-04 MED ORDER — ROPIVACAINE HCL 5 MG/ML IJ SOLN
INTRAMUSCULAR | Status: DC | PRN
Start: 2015-10-04 — End: 2015-10-04
  Administered 2015-10-04: 30 mL via PERINEURAL

## 2015-10-04 MED ORDER — FENTANYL CITRATE (PF) 100 MCG/2ML IJ SOLN
INTRAMUSCULAR | Status: AC
  Filled 2015-10-04: qty 2

## 2015-10-04 MED ORDER — PROPOFOL 10 MG/ML IV BOLUS
INTRAVENOUS | Status: AC
  Filled 2015-10-04: qty 20

## 2015-10-04 MED ORDER — DOCUSATE SODIUM 100 MG PO CAPS
100.0000 mg | ORAL_CAPSULE | Freq: Two times a day (BID) | ORAL | Status: DC
  Administered 2015-10-04 – 2015-10-06 (×4): 100 mg via ORAL
  Filled 2015-10-04 (×5): qty 1

## 2015-10-04 MED ORDER — ONDANSETRON HCL 4 MG/2ML IJ SOLN
4.0000 mg | Freq: Four times a day (QID) | INTRAMUSCULAR | Status: DC | PRN
  Administered 2015-10-04 – 2015-10-05 (×3): 4 mg via INTRAVENOUS
  Filled 2015-10-04 (×3): qty 2

## 2015-10-04 MED ORDER — BISACODYL 5 MG PO TBEC: 5.0000 mg | DELAYED_RELEASE_TABLET | Freq: Every day | ORAL | Status: DC | PRN

## 2015-10-04 MED ORDER — METHOCARBAMOL 1000 MG/10ML IJ SOLN
500.0000 mg | Freq: Four times a day (QID) | INTRAVENOUS | Status: DC | PRN
  Filled 2015-10-04: qty 5

## 2015-10-04 MED ORDER — FENTANYL CITRATE (PF) 100 MCG/2ML IJ SOLN
INTRAMUSCULAR | Status: DC | PRN
  Administered 2015-10-04 (×2): 50 ug via INTRAVENOUS

## 2015-10-04 MED ORDER — GABAPENTIN 300 MG PO CAPS
300.0000 mg | ORAL_CAPSULE | Freq: Two times a day (BID) | ORAL | Status: DC
  Administered 2015-10-04 – 2015-10-06 (×5): 300 mg via ORAL
  Filled 2015-10-04 (×5): qty 1

## 2015-10-04 MED ORDER — TRANEXAMIC ACID 1000 MG/10ML IV SOLN
1000.0000 mg | INTRAVENOUS | Status: DC
  Filled 2015-10-04: qty 10

## 2015-10-04 MED ORDER — ALUM & MAG HYDROXIDE-SIMETH 200-200-20 MG/5ML PO SUSP: 30.0000 mL | ORAL | Status: DC | PRN

## 2015-10-04 MED ORDER — PROPOFOL 500 MG/50ML IV EMUL
INTRAVENOUS | Status: DC | PRN
  Administered 2015-10-04: 100 ug/kg/min via INTRAVENOUS

## 2015-10-04 MED ORDER — LACTATED RINGERS IV SOLN
INTRAVENOUS | Status: DC | PRN
  Administered 2015-10-04: 07:00:00 via INTRAVENOUS

## 2015-10-04 MED ORDER — ACETAMINOPHEN 325 MG PO TABS
650.0000 mg | ORAL_TABLET | Freq: Four times a day (QID) | ORAL | Status: DC | PRN
  Administered 2015-10-06: 650 mg via ORAL
  Filled 2015-10-04: qty 2

## 2015-10-04 MED ORDER — ACETAMINOPHEN 650 MG RE SUPP: 650.0000 mg | Freq: Four times a day (QID) | RECTAL | Status: DC | PRN

## 2015-10-04 MED ORDER — SODIUM CHLORIDE 0.9 % IV SOLN: INTRAVENOUS | Status: DC

## 2015-10-04 MED ORDER — BUPIVACAINE LIPOSOME 1.3 % IJ SUSP
20.0000 mL | Freq: Once | INTRAMUSCULAR | Status: AC
  Administered 2015-10-04: 20 mL
  Filled 2015-10-04: qty 20

## 2015-10-04 MED ORDER — LIDOCAINE 2% (20 MG/ML) 5 ML SYRINGE
INTRAMUSCULAR | Status: AC
  Filled 2015-10-04: qty 5

## 2015-10-04 MED ORDER — BUPIVACAINE HCL (PF) 0.5 % IJ SOLN
INTRAMUSCULAR | Status: DC | PRN
  Administered 2015-10-04: 20 mL

## 2015-10-04 MED ORDER — APIXABAN 2.5 MG PO TABS
2.5000 mg | ORAL_TABLET | Freq: Two times a day (BID) | ORAL | Status: DC
  Administered 2015-10-05 – 2015-10-06 (×3): 2.5 mg via ORAL
  Filled 2015-10-04 (×3): qty 1

## 2015-10-04 MED ORDER — TIZANIDINE HCL 2 MG PO TABS: 2.0000 mg | ORAL_TABLET | Freq: Three times a day (TID) | ORAL | 0 refills | Status: DC | PRN

## 2015-10-04 MED ORDER — CEFAZOLIN SODIUM-DEXTROSE 2-4 GM/100ML-% IV SOLN
2.0000 g | Freq: Four times a day (QID) | INTRAVENOUS | Status: AC
  Administered 2015-10-04 (×2): 2 g via INTRAVENOUS
  Filled 2015-10-04 (×2): qty 100

## 2015-10-04 MED ORDER — HYDROMORPHONE HCL 1 MG/ML IJ SOLN: 0.2500 mg | INTRAMUSCULAR | Status: DC | PRN

## 2015-10-04 MED ORDER — ROPIVACAINE HCL 5 MG/ML IJ SOLN: INTRAMUSCULAR | Status: DC | PRN

## 2015-10-04 MED ORDER — NIACIN ER 500 MG PO TBCR
500.0000 mg | EXTENDED_RELEASE_TABLET | Freq: Every day | ORAL | Status: DC
  Administered 2015-10-04 – 2015-10-05 (×2): 500 mg via ORAL
  Filled 2015-10-04 (×2): qty 1

## 2015-10-04 MED ORDER — DEXAMETHASONE SODIUM PHOSPHATE 10 MG/ML IJ SOLN
10.0000 mg | Freq: Two times a day (BID) | INTRAMUSCULAR | Status: AC
  Administered 2015-10-04 (×2): 10 mg via INTRAVENOUS
  Filled 2015-10-04 (×2): qty 1

## 2015-10-04 MED ORDER — SODIUM CHLORIDE 0.9 % IR SOLN
Status: DC | PRN
  Administered 2015-10-04: 3000 mL

## 2015-10-04 MED ORDER — HYDROMORPHONE HCL 1 MG/ML IJ SOLN
0.5000 mg | INTRAMUSCULAR | Status: DC | PRN
  Administered 2015-10-04 – 2015-10-05 (×3): 1 mg via INTRAVENOUS
  Filled 2015-10-04 (×3): qty 1

## 2015-10-04 MED ORDER — TRANEXAMIC ACID 1000 MG/10ML IV SOLN
1000.0000 mg | INTRAVENOUS | Status: AC
  Administered 2015-10-04: 1000 mg via INTRAVENOUS
  Filled 2015-10-04: qty 10

## 2015-10-04 MED ORDER — OXYCODONE HCL 5 MG PO TABS
5.0000 mg | ORAL_TABLET | ORAL | Status: DC | PRN
  Administered 2015-10-05 – 2015-10-06 (×3): 10 mg via ORAL
  Filled 2015-10-04 (×4): qty 2

## 2015-10-04 MED ORDER — POLYETHYLENE GLYCOL 3350 17 G PO PACK: 17.0000 g | PACK | Freq: Every day | ORAL | Status: DC | PRN

## 2015-10-04 MED ORDER — CEFAZOLIN SODIUM-DEXTROSE 2-4 GM/100ML-% IV SOLN
2.0000 g | INTRAVENOUS | Status: AC
  Administered 2015-10-04: 2 g via INTRAVENOUS
  Filled 2015-10-04: qty 100

## 2015-10-04 MED ORDER — METHOCARBAMOL 500 MG PO TABS
500.0000 mg | ORAL_TABLET | Freq: Four times a day (QID) | ORAL | Status: DC | PRN
  Administered 2015-10-05 – 2015-10-06 (×2): 500 mg via ORAL
  Filled 2015-10-04 (×2): qty 1

## 2015-10-04 MED ORDER — 0.9 % SODIUM CHLORIDE (POUR BTL) OPTIME
TOPICAL | Status: DC | PRN
  Administered 2015-10-04: 1000 mL

## 2015-10-04 MED ORDER — INSULIN ASPART 100 UNIT/ML ~~LOC~~ SOLN
0.0000 [IU] | Freq: Three times a day (TID) | SUBCUTANEOUS | Status: DC
  Administered 2015-10-04: 5 [IU] via SUBCUTANEOUS
  Administered 2015-10-05: 8 [IU] via SUBCUTANEOUS
  Administered 2015-10-05 (×2): 5 [IU] via SUBCUTANEOUS
  Administered 2015-10-06: 3 [IU] via SUBCUTANEOUS

## 2015-10-04 MED ORDER — CHLORHEXIDINE GLUCONATE 4 % EX LIQD: 60.0000 mL | Freq: Once | CUTANEOUS | Status: DC

## 2015-10-04 MED ORDER — MAGNESIUM CITRATE PO SOLN: 1.0000 | Freq: Once | ORAL | Status: DC | PRN

## 2015-10-04 MED ORDER — BUPIVACAINE HCL (PF) 0.5 % IJ SOLN
INTRAMUSCULAR | Status: AC
  Filled 2015-10-04: qty 30

## 2015-10-04 MED ORDER — SODIUM CHLORIDE 0.9 % IJ SOLN
INTRAMUSCULAR | Status: DC | PRN
  Administered 2015-10-04: 20 mL

## 2015-10-04 MED ORDER — ONDANSETRON HCL 4 MG PO TABS
4.0000 mg | ORAL_TABLET | Freq: Four times a day (QID) | ORAL | Status: DC | PRN
  Administered 2015-10-05: 4 mg via ORAL
  Filled 2015-10-04: qty 1

## 2015-10-04 SURGICAL SUPPLY — 62 items
BANDAGE ESMARK 6X9 LF (GAUZE/BANDAGES/DRESSINGS) ×1 IMPLANT
BENZOIN TINCTURE PRP APPL 2/3 (GAUZE/BANDAGES/DRESSINGS) ×2 IMPLANT
BLADE SAGITTAL 25.0X1.19X90 (BLADE) ×2 IMPLANT
BLADE SAW SAG 90X13X1.27 (BLADE) ×2 IMPLANT
BNDG ESMARK 6X9 LF (GAUZE/BANDAGES/DRESSINGS) ×2
BOWL SMART MIX CTS (DISPOSABLE) ×2 IMPLANT
CAPT KNEE TOTAL 3 ATTUNE ×2 IMPLANT
CEMENT HV SMART SET (Cement) ×4 IMPLANT
COVER SURGICAL LIGHT HANDLE (MISCELLANEOUS) ×2 IMPLANT
CUFF TOURNIQUET SINGLE 34IN LL (TOURNIQUET CUFF) ×2 IMPLANT
CUFF TOURNIQUET SINGLE 44IN (TOURNIQUET CUFF) IMPLANT
DRAPE EXTREMITY T 121X128X90 (DRAPE) ×2 IMPLANT
DRAPE IMP U-DRAPE 54X76 (DRAPES) ×2 IMPLANT
DRAPE U-SHAPE 47X51 STRL (DRAPES) ×2 IMPLANT
DRSG AQUACEL AG ADV 3.5X10 (GAUZE/BANDAGES/DRESSINGS) ×2 IMPLANT
DRSG MEPILEX BORDER 4X12 (GAUZE/BANDAGES/DRESSINGS) IMPLANT
DRSG PAD ABDOMINAL 8X10 ST (GAUZE/BANDAGES/DRESSINGS) ×2 IMPLANT
DURAPREP 26ML APPLICATOR (WOUND CARE) ×2 IMPLANT
ELECT REM PT RETURN 9FT ADLT (ELECTROSURGICAL) ×2
ELECTRODE REM PT RTRN 9FT ADLT (ELECTROSURGICAL) ×1 IMPLANT
EVACUATOR 1/8 PVC DRAIN (DRAIN) IMPLANT
FACESHIELD WRAPAROUND (MASK) IMPLANT
GAUZE SPONGE 4X4 12PLY STRL (GAUZE/BANDAGES/DRESSINGS) ×2 IMPLANT
GLOVE BIOGEL PI IND STRL 8 (GLOVE) ×2 IMPLANT
GLOVE BIOGEL PI INDICATOR 8 (GLOVE) ×2
GLOVE ECLIPSE 7.5 STRL STRAW (GLOVE) ×4 IMPLANT
GOWN STRL REUS W/ TWL LRG LVL3 (GOWN DISPOSABLE) ×1 IMPLANT
GOWN STRL REUS W/ TWL XL LVL3 (GOWN DISPOSABLE) ×2 IMPLANT
GOWN STRL REUS W/TWL LRG LVL3 (GOWN DISPOSABLE) ×1
GOWN STRL REUS W/TWL XL LVL3 (GOWN DISPOSABLE) ×2
HANDPIECE INTERPULSE COAX TIP (DISPOSABLE) ×1
HOOD PEEL AWAY FACE SHEILD DIS (HOOD) ×4 IMPLANT
IMMOBILIZER KNEE 20 (SOFTGOODS) ×2 IMPLANT
IMMOBILIZER KNEE 22 UNIV (SOFTGOODS) IMPLANT
KIT BASIN OR (CUSTOM PROCEDURE TRAY) ×2 IMPLANT
KIT ROOM TURNOVER OR (KITS) ×2 IMPLANT
MANIFOLD NEPTUNE II (INSTRUMENTS) ×2 IMPLANT
NEEDLE SPNL 22GX3.5 QUINCKE BK (NEEDLE) ×2 IMPLANT
NS IRRIG 1000ML POUR BTL (IV SOLUTION) ×2 IMPLANT
PACK TOTAL JOINT (CUSTOM PROCEDURE TRAY) ×2 IMPLANT
PACK UNIVERSAL I (CUSTOM PROCEDURE TRAY) IMPLANT
PAD ARMBOARD 7.5X6 YLW CONV (MISCELLANEOUS) ×4 IMPLANT
PAD CAST 4YDX4 CTTN HI CHSV (CAST SUPPLIES) ×1 IMPLANT
PADDING CAST COTTON 4X4 STRL (CAST SUPPLIES) ×1
PADDING CAST COTTON 6X4 STRL (CAST SUPPLIES) ×2 IMPLANT
SET HNDPC FAN SPRY TIP SCT (DISPOSABLE) ×1 IMPLANT
STAPLER VISISTAT 35W (STAPLE) IMPLANT
STRIP CLOSURE SKIN 1/2X4 (GAUZE/BANDAGES/DRESSINGS) ×4 IMPLANT
SUCTION FRAZIER HANDLE 10FR (MISCELLANEOUS) ×1
SUCTION TUBE FRAZIER 10FR DISP (MISCELLANEOUS) ×1 IMPLANT
SUT MNCRL AB 3-0 PS2 18 (SUTURE) ×2 IMPLANT
SUT VIC AB 0 CTB1 27 (SUTURE) ×4 IMPLANT
SUT VIC AB 1 CT1 27 (SUTURE) ×2
SUT VIC AB 1 CT1 27XBRD ANBCTR (SUTURE) ×2 IMPLANT
SUT VIC AB 2-0 CTB1 (SUTURE) ×4 IMPLANT
SYR 50ML LL SCALE MARK (SYRINGE) ×2 IMPLANT
TOWEL OR 17X24 6PK STRL BLUE (TOWEL DISPOSABLE) ×2 IMPLANT
TOWEL OR 17X26 10 PK STRL BLUE (TOWEL DISPOSABLE) ×2 IMPLANT
TRAY CATH 16FR W/PLASTIC CATH (SET/KITS/TRAYS/PACK) ×2 IMPLANT
TRAY FOLEY CATH 16FRSI W/METER (SET/KITS/TRAYS/PACK) IMPLANT
WRAP KNEE MAXI GEL POST OP (GAUZE/BANDAGES/DRESSINGS) ×2 IMPLANT
YANKAUER SUCT BULB TIP NO VENT (SUCTIONS) ×2 IMPLANT

## 2015-10-04 NOTE — Transfer of Care (Signed)
Immediate Anesthesia Transfer of Care Note  Patient: Wanda [name]  Procedure(s) Performed: Procedure(s): TOTAL KNEE ARTHROPLASTY (Right)  Patient Location: PACU  Anesthesia Type:MAC combined with regional for post-op pain  Level of Consciousness: awake, alert  and oriented  Airway & Oxygen Therapy: Patient Spontanous Breathing and Patient connected to face mask oxygen  Post-op Assessment: Report given to RN and Post -op Vital signs reviewed and stable  Post vital signs: Reviewed and stable  Last Vitals:  Vitals:   10/04/15 0626  BP: (!) 204/100  Pulse: 80  Resp: 20  Temp: 36.8 C    Last Pain:  Vitals:   10/04/15 0626  TempSrc: Oral         Complications: No apparent anesthesia complications

## 2015-10-04 NOTE — Evaluation (Signed)
Physical Therapy Evaluation Patient Details Name: Wanda [name] MRN: [id number] DOB: [birth year] Today's Date: 10/04/2015   History of Present Illness  Patient is [name] 75 y/o female with hx of skin ca, HTN, DM presents s/p right TKA.  Clinical Impression  Patient presents with pain and post surgical deficits s/p above surgery. Tolerated transfers and gait training with Min [name] for balance/safety. Pt independent PTA. Education re: exercises, positioning, zero degree knee etc. Pt plans to discharge to [facility] SNF. Will follow acutely to maximize independence and mobility.    Follow Up Recommendations SNF ([facility])    Equipment Recommendations  None recommended by PT    Recommendations for Other Services OT consult     Precautions / Restrictions Precautions Precautions: Knee Precaution Booklet Issued: No Precaution Comments: Reviewed no pillow under knee and precautions Required Braces or Orthoses: Knee Immobilizer - Right Knee Immobilizer - Right: On when out of bed or walking Restrictions Weight Bearing Restrictions: Yes RLE Weight Bearing: Weight bearing as tolerated      Mobility  Bed Mobility Overal bed mobility: Needs Assistance Bed Mobility: Supine to Sit     Supine to sit: Min guard;HOB elevated     General bed mobility comments: No assist needed, use of rail to get to EOB.   Transfers Overall transfer level: Needs assistance Equipment used: Rolling walker (2 wheeled) Transfers: Sit to/from Stand Sit to Stand: Min assist         General transfer comment: Min [name] to power to standing with cues for hand placement/technique as pt pulling up on RW. Stood from [employer], from toilet x1. Transferred to chair post ambulation bout.  Ambulation/Gait Ambulation/Gait assistance: Min assist Ambulation Distance (Feet): 15 Feet (x2 bouts) Assistive device: Rolling walker (2 wheeled) Gait Pattern/deviations: Step-to pattern;Decreased stance time - right;Decreased step length -  left;Trunk flexed Gait velocity: decreased   General Gait Details: Slow, unsteady gait with right knee instability noted due to affects of nerve block.   Stairs            Wheelchair Mobility    Modified Rankin (Stroke Patients Only)       Balance Overall balance assessment: Needs assistance Sitting-balance support: Feet supported;No upper extremity supported Sitting balance-Leahy Scale: Good Sitting balance - Comments: Able to perform pericare without assist.    Standing balance support: During functional activity;Bilateral upper extremity supported Standing balance-Leahy Scale: Poor Standing balance comment: Reliant on BUEs for support in standing.                             Pertinent Vitals/Pain Pain Assessment: Faces Faces Pain Scale: Hurts [name] little bit Pain Location: right knee Pain Descriptors / Indicators: Sore;Operative site guarding Pain Intervention(s): Monitored during session;Repositioned;Premedicated before session    Home Living Family/patient expects to be discharged to:: Skilled nursing facility [facility]) Living Arrangements: Alone                    Prior Function Level of Independence: Independent         Comments: Drives, cooks, cleans.     Hand Dominance        Extremity/Trunk Assessment   Upper Extremity Assessment: Defer to OT evaluation           Lower Extremity Assessment: RLE deficits/detail RLE Deficits / Details: Limited AROM/strength secondary to pain/surgery       Communication   Communication: No difficulties  Cognition Arousal/Alertness: Awake/alert Behavior During Therapy:  WFL for tasks assessed/performed Overall Cognitive Status: Within Functional Limits for tasks assessed                      General Comments General comments (skin integrity, edema, etc.): Daughter present during session.    Exercises Total Joint Exercises Ankle Circles/Pumps: Both;10 reps;Supine Quad  Sets: Both;10 reps;Supine Gluteal Sets: Both;10 reps;Seated   Assessment/Plan    PT Assessment Patient needs continued PT services  PT Problem List Decreased strength;Decreased mobility;Decreased range of motion;Decreased activity tolerance;Pain;Impaired sensation;Decreased balance          PT Treatment Interventions DME instruction;Therapeutic activities;Gait training;Therapeutic exercise;Patient/family education;Balance training;Stair training;Functional mobility training    PT Goals (Current goals can be found in the Care Plan section)  Acute Rehab PT Goals Patient Stated Goal: to go to rehab, "I am going to be the fastest one to recover there ever was" PT Goal Formulation: With patient Time For Goal Achievement: 10/18/15 Potential to Achieve Goals: Good    Frequency 7X/week   Barriers to discharge Decreased caregiver support      Co-evaluation               End of Session Equipment Utilized During Treatment: Gait belt Activity Tolerance: Patient tolerated treatment well Patient left: in chair;with call bell/phone within reach;with family/visitor present Nurse Communication: Mobility status         Time: [id number] PT Time Calculation (min) (ACUTE ONLY): 33 min   Charges:   PT Evaluation $PT Eval Low Complexity: 1 Procedure PT Treatments $Gait Training: 8-22 mins   PT G Codes:        [name] [name] 10/04/2015, 4:01 PM Wray Kearns, [place], DPT [phone]

## 2015-10-04 NOTE — Anesthesia Procedure Notes (Signed)
Procedure Name: MAC Date/Time: 10/04/2015 7:38 AM Performed by: Kyung Rudd Pre-anesthesia Checklist: Patient identified, Emergency Drugs available, Suction available and Patient being monitored Patient Re-evaluated:Patient Re-evaluated prior to inductionOxygen Delivery Method: Simple face mask Intubation Type: IV induction Placement Confirmation: positive ETCO2 and breath sounds checked- equal and bilateral

## 2015-10-04 NOTE — Progress Notes (Signed)
Orthopedic Tech Progress Note Patient Details:  Wanda [name] 1940/03/10 [id number]  CPM Right Knee CPM Right Knee: On Right Knee Flexion (Degrees): 90 Right Knee Extension (Degrees): 0 Additional Comments: trapeze bar patient helper   [name] 10/04/2015, 11:32 AM Viewed order from doctor's order list

## 2015-10-04 NOTE — Anesthesia Procedure Notes (Addendum)
Anesthesia Regional Block:  Adductor canal block  Pre-Anesthetic Checklist: ,, timeout performed, Correct Patient, Correct Site, Correct Laterality, Correct Procedure, Correct Position, site marked, Risks and benefits discussed, pre-op evaluation,  At surgeon's request and post-op pain management  Laterality: Right  Prep: Maximum Sterile Barrier Precautions used, chloraprep       Needles:  Injection technique: Single-shot  Needle Type: Echogenic Stimulator Needle     Needle Length: 5cm 5 cm Needle Gauge: 22 and 22 G    Additional Needles:  Procedures: ultrasound guided (picture in chart) Adductor canal block Narrative:  Start time: 10/04/2015 6:50 AM End time: 10/04/2015 7:00 AM Injection made incrementally with aspirations every 5 mL. Anesthesiologist: Roderic Palau  Additional Notes: 2% Lidocaine skin wheel.

## 2015-10-04 NOTE — Progress Notes (Signed)
Pt. Expressing that she is very nervous, states she feels very shaky "inside". Dr. Ola Spurr aware of BP upon entering room of pt.

## 2015-10-04 NOTE — Brief Op Note (Signed)
10/04/2015  8:50 AM  PATIENT:  Wanda [name]  75 y.o. female  PRE-OPERATIVE DIAGNOSIS:  Osteoarthritis right knee   POST-OPERATIVE DIAGNOSIS:  Osteoarthritis right knee   PROCEDURE:  Procedure(s): TOTAL KNEE ARTHROPLASTY (Right)  SURGEON:  Surgeon(s) and Role:    * Dorna Leitz, MD - Primary  PHYSICIAN ASSISTANT:   ASSISTANTS: bethune   ANESTHESIA:   spinal  EBL:  No intake/output data recorded.  BLOOD ADMINISTERED:none  DRAINS: none   LOCAL MEDICATIONS USED:  MARCAINE    and OTHER experel  SPECIMEN:  No Specimen  DISPOSITION OF SPECIMEN:  N/A  COUNTS:  YES  TOURNIQUET:   Total Tourniquet Time Documented: Thigh (Right) - 50 minutes Total: Thigh (Right) - 50 minutes   DICTATION: .Other Dictation: Dictation Number none given  PLAN OF CARE: Admit to inpatient   PATIENT DISPOSITION:  PACU - hemodynamically stable.   Delay start of Pharmacological VTE agent (>24hrs) due to surgical blood loss or risk of bleeding: no

## 2015-10-04 NOTE — Anesthesia Procedure Notes (Signed)
Spinal  Patient location during procedure: OR Start time: 10/04/2015 7:32 AM End time: 10/04/2015 7:36 AM Staffing Anesthesiologist: Roderic Palau Performed: anesthesiologist  Preanesthetic Checklist Completed: patient identified, surgical consent, pre-op evaluation, timeout performed, IV checked, risks and benefits discussed and monitors and equipment checked Spinal Block Patient position: sitting Prep: DuraPrep Patient monitoring: cardiac monitor, continuous pulse ox and blood pressure Approach: midline Location: L3-4 Injection technique: single-shot Needle Needle type: Pencan  Needle gauge: 24 G Needle length: 9 cm Assessment Sensory level: T8 Additional Notes Functioning IV was confirmed and monitors were applied. Sterile prep and drape, including hand hygiene and sterile gloves were used. The patient was positioned and the spine was prepped. The skin was anesthetized with lidocaine.  Free flow of clear CSF was obtained prior to injecting local anesthetic into the CSF.  The spinal needle aspirated freely following injection.  The needle was carefully withdrawn.  The patient tolerated the procedure well.

## 2015-10-04 NOTE — Anesthesia Postprocedure Evaluation (Signed)
Anesthesia Post Note  Patient: [name]  Procedure(s) Performed: Procedure(s) (LRB): TOTAL KNEE ARTHROPLASTY (Right)  Patient location during evaluation: PACU Anesthesia Type: Spinal and MAC Level of consciousness: awake and alert Pain management: pain level controlled Vital Signs Assessment: post-procedure vital signs reviewed and stable Respiratory status: spontaneous breathing and respiratory function stable Cardiovascular status: blood pressure returned to baseline and stable Postop Assessment: spinal receding Anesthetic complications: no    Last Vitals:  Vitals:   10/04/15 1115 10/04/15 1131  BP:  (!) 164/70  Pulse:  71  Resp:    Temp: 36.7 C 36.6 C    Last Pain:  Vitals:   10/04/15 0626  TempSrc: Oral                 Elanda Garmany,W. EDMOND

## 2015-10-04 NOTE — Op Note (Signed)
NAME:  Wanda [name], Wanda [name]               ACCOUNT NO.:  [id number]  MEDICAL RECORD NO.:  [id number]  LOCATION:  [facility]                         FACILITY:  [facility]  PHYSICIAN:  Alta Corning, M.D.   DATE OF BIRTH:  08/16/1940  DATE OF PROCEDURE:  10/04/2015 DATE OF DISCHARGE:                              OPERATIVE REPORT   BRIEF HISTORY:  She is a 75 year old female with a long history of complaints of bilateral knee pain, right greater than left.  After failure of all conservative care, she was taken to the operating room for right total hip replacement.  She was having night pain and light activity pain.  X-ray showed bone-on-bone change and she had failed injection therapy, activity modification, and viscosupplementation.  DESCRIPTION OF PROCEDURE:  The patient was taken to the operating room. After adequate anesthesia was obtained with general anesthetic, the patient was placed supine on the operating table, and the right leg was prepped and draped in sterile fashion.  Following this, the leg was exsanguinated.  Blood pressure tourniquet inflated to 300 mmHg. Following this, a midline incision was made in subcutaneous tissue and carried down to the level of extensor mechanism.  Medial parapatellar arthrotomy was undertaken.  Once this was done, medial and lateral meniscus removed, retropatellar fat pad, synovium on the anterior aspect of the femur, and anterior posterior cruciates.  At this point, the attention was turned to the femur where an intramedullary pilot hole was drilled and 90 degrees of distal bone was taken along with a 5-degree valgus inclination.  Once this was done, the femur was sized to a 4. Anterior-posterior cuts were made, chamfers and box.  Attention then turned to the tibia, which was cut perpendicular to the long axis of the tibia with a 3-degree posterior inclination.  Once this was accomplished, the tibia sized, it was drilled and keeled, and a size 4 was chosen  and placed and rotationally aligned appropriately.  Attention was turned back to the femur where the trial 4 was placed and an 8-mm trial implant was used.  Attention was then turned to the patella, it measured only 21 through the 7-1/2 adapter and cut down to a level of 13 mm.  A 38 was chosen and lugs were drilled for the femur.  The trial patella was placed, put through a range of motion.  Excellent stability and range of motion were achieved.  At this point, all trials were removed.  The knee was copiously and thoroughly lavaged with pulsatile lavage, irrigation and suctioned dry and the final components were then cemented into place size 4 narrow femur, which we had chosen based on the alignment of the 4, a size 4 tibia, 8-mm bridging bearing trial was placed and a 38 all poly patella placed and held with a clamp.  As the cement was allowed to completely harden, all excess bone cement was removed.  The tourniquet was let down.  All bleeding was controlled with electrocautery.  A 60 mL of 20 mL Exparel, 20 mL saline, and 20 mL of Marcaine were instilled throughout the synovial reflection of the knee for postoperative pain control.  At this point, the knee scopes were  thoroughly irrigated and suctioned dry and the final poly was placed, size 8.  It feels great in flexion, extension and through range of motion.  Once this was accomplished, then the attention was turned back the closure where a medial parapatellar arthrotomy was closed with 1 Vicryl running, the skin with 0 and 2-0 Vicryl, and 3-0 Monocryl subcuticular.  Benzoin and Steri-Strips were applied.  Sterile compressive dressing was applied.  The patient was taken to the recovery room where she was noted to be in satisfactory condition.  Estimated blood loss for the procedure was minimal.     Alta Corning, M.D.     Corliss Skains  D:  10/04/2015  T:  10/04/2015  Job:  [id number]

## 2015-10-04 NOTE — Discharge Instructions (Signed)

## 2015-10-05 ENCOUNTER — Encounter (HOSPITAL_COMMUNITY): Payer: Self-pay

## 2015-10-05 LAB — CBC
HCT: 35.9 % — ABNORMAL LOW (ref 36.0–46.0)
HEMOGLOBIN: 12.1 g/dL (ref 12.0–15.0)
MCH: 30.3 pg (ref 26.0–34.0)
MCHC: 33.7 g/dL (ref 30.0–36.0)
MCV: 90 fL (ref 78.0–100.0)
Platelets: 210 10*3/uL (ref 150–400)
RBC: 3.99 MIL/uL (ref 3.87–5.11)
RDW: 12.8 % (ref 11.5–15.5)
WBC: 11.1 10*3/uL — ABNORMAL HIGH (ref 4.0–10.5)

## 2015-10-05 LAB — GLUCOSE, CAPILLARY
GLUCOSE-CAPILLARY: 219 mg/dL — AB (ref 65–99)
GLUCOSE-CAPILLARY: 203 mg/dL — AB (ref 65–99)
Glucose-Capillary: 280 mg/dL — ABNORMAL HIGH (ref 65–99)
Glucose-Capillary: 238 mg/dL — ABNORMAL HIGH (ref 65–99)

## 2015-10-05 LAB — BASIC METABOLIC PANEL
Anion gap: 10 (ref 5–15)
BUN: 12 mg/dL (ref 6–20)
CHLORIDE: 106 mmol/L (ref 101–111)
CO2: 21 mmol/L — ABNORMAL LOW (ref 22–32)
CREATININE: 0.75 mg/dL (ref 0.44–1.00)
Calcium: 9.6 mg/dL (ref 8.9–10.3)
GFR calc Af Amer: >60 mL/min (ref >60)
GFR calc non Af Amer: >60 mL/min (ref >60)
Glucose, Bld: 269 mg/dL — ABNORMAL HIGH (ref 65–99)
Potassium: 3.7 mmol/L (ref 3.5–5.1)
SODIUM: 137 mmol/L (ref 135–145)

## 2015-10-05 NOTE — NC FL2 (Signed)
[place] LEVEL OF CARE SCREENING TOOL     IDENTIFICATION  Patient Name: Wanda [name] Birthdate: 1940/08/25 Sex: female Admission Date (Current Location): 10/04/2015  [facility] and [place] Number:  Herbalist and Address:  [facility], [place] [address], [place], [place] [address]      Provider Number: [id number]  Attending Physician Name and Address:  Dorna Leitz, MD  Relative Name and Phone Number:  Mosie Epstein [phone]    Current Level of Care: Hospital Recommended Level of Care: [place] Prior Approval Number:    Date Approved/Denied:   PASRR Number: [id number]  Discharge Plan: SNF    Current Diagnoses: Patient Active Problem List   Diagnosis Date Noted  . Primary osteoarthritis of right knee 10/04/2015  . Osteoporosis 08/23/2015  . Multinodular goiter (nontoxic) 04/07/2014  . Diabetes mellitus type 2, uncontrolled, without complications ([place]) [id number]  . Insulin adverse reaction 09/27/2013  . Senile nuclear sclerosis 08/14/2012  . HTN, goal below 130/80 03/10/2011  . Hyperlipidemia with target LDL less than 70 03/10/2011  . Obesity (BMI 30.0-34.9) 03/10/2011  . Health care maintenance 03/10/2011    Orientation RESPIRATION BLADDER Height & Weight     Self, Time, Situation, Place  Normal   Weight: 173 lb 1 oz (78.5 kg) Height:  5' (152.4 cm)  BEHAVIORAL SYMPTOMS/MOOD NEUROLOGICAL BOWEL NUTRITION STATUS        Diet (Carb Modified)  AMBULATORY STATUS COMMUNICATION OF NEEDS Skin   Limited Assist Verbally Surgical wounds (Incision closed right knee)                       Personal Care Assistance Level of Assistance  Bathing, Dressing, Feeding Bathing Assistance: Limited assistance Feeding assistance: Independent Dressing Assistance: Limited assistance     Functional Limitations Info             SPECIAL CARE FACTORS FREQUENCY                       Contractures  Contractures Info: Not present    Additional Factors Info  Code Status, Allergies Code Status Info: Full Allergies Info: ACE INHIBITORS, ALENDRONATE SODIUM, ASPIRIN, GLUCOTROL GLIPIZIDE, LIPITOR ATORVASTATIN CALCIUM, METFORMIN AND RELATED, SHELLFISH ALLERGY            Current Medications (10/05/2015):  This is the current hospital active medication list Current Facility-Administered Medications  Medication Dose Route Frequency Provider Last Rate Last Dose  . 0.9 %  sodium chloride infusion   Intravenous Continuous Gary Fleet, PA-C      . acetaminophen (TYLENOL) tablet 650 mg  650 mg Oral Q6H PRN Gary Fleet, PA-C       Or  . acetaminophen (TYLENOL) suppository 650 mg  650 mg Rectal Q6H PRN Gary Fleet, PA-C      . alum & mag hydroxide-simeth (MAALOX/MYLANTA) 200-200-20 MG/5ML suspension 30 mL  30 mL Oral Q4H PRN Gary Fleet, PA-C      . apixaban Arne Cleveland) tablet 2.5 mg  2.5 mg Oral Q12H Gary Fleet, PA-C   2.5 mg at 10/05/15 1012  . bisacodyl (DULCOLAX) EC tablet 5 mg  5 mg Oral Daily PRN Gary Fleet, PA-C      . diphenhydrAMINE (BENADRYL) 12.5 MG/5ML elixir 12.5-25 mg  12.5-25 mg Oral Q4H PRN Gary Fleet, PA-C      . docusate sodium (COLACE) capsule 100 mg  100 mg Oral BID Gary Fleet, PA-C   100 mg at  10/04/15 2117  . gabapentin (NEURONTIN) capsule 300 mg  300 mg Oral BID Gary Fleet, PA-C   300 mg at 10/05/15 1012  . HYDROmorphone (DILAUDID) injection 0.5-1 mg  0.5-1 mg Intravenous Q3H PRN Gary Fleet, PA-C   1 mg at 10/04/15 1529  . insulin aspart (novoLOG) injection 0-15 Units  0-15 Units Subcutaneous TID WC Gary Fleet, PA-C   5 Units at 10/05/15 1135  . insulin glargine (LANTUS) injection 15 Units  15 Units Subcutaneous QHS Gary Fleet, PA-C   15 Units at 10/04/15 2118  . magnesium citrate solution 1 Bottle  1 Bottle Oral Once PRN Gary Fleet, PA-C      . methocarbamol (ROBAXIN) tablet 500 mg  500 mg Oral Q6H PRN Gary Fleet, PA-C       Or  .  methocarbamol (ROBAXIN) 500 mg in dextrose 5 % 50 mL IVPB  500 mg Intravenous Q6H PRN Gary Fleet, PA-C      . niacin (SLO-NIACIN) CR tablet 500 mg  500 mg Oral QHS Gary Fleet, PA-C   500 mg at 10/04/15 2117  . ondansetron (ZOFRAN) tablet 4 mg  4 mg Oral Q6H PRN Gary Fleet, PA-C   4 mg at 10/05/15 1012   Or  . ondansetron (ZOFRAN) injection 4 mg  4 mg Intravenous Q6H PRN Gary Fleet, PA-C   4 mg at 10/04/15 2247  . oxyCODONE (Oxy IR/ROXICODONE) immediate release tablet 5-10 mg  5-10 mg Oral Q3H PRN Gary Fleet, PA-C   10 mg at 10/05/15 1019  . polyethylene glycol (MIRALAX / GLYCOLAX) packet 17 g  17 g Oral Daily PRN Gary Fleet, PA-C      . zolpidem (AMBIEN) tablet 5 mg  5 mg Oral QHS PRN Gary Fleet, PA-C         Discharge Medications: Please see discharge summary for a list of discharge medications.  Relevant Imaging Results:  Relevant Lab Results:   Additional Information SSN: [id number]  Alla German, LCSW

## 2015-10-05 NOTE — Care Management (Signed)
Patient will need shortterm rehab at SNF, will go to [facility]. Social worker is aware.

## 2015-10-05 NOTE — Progress Notes (Signed)
Physical Therapy Treatment Patient Details Name: Wanda [name] MRN: [id number] DOB: [birth year] Today's Date: 10/05/2015    History of Present Illness Patient is a 75 y/o female with hx of skin ca, HTN, DM presents s/p right TKA.    PT Comments    Pt performed review of LE therapeutic exercise to improve strength and promote functional independence.  Pt able to advance gait training distance.    Follow Up Recommendations  SNF [facility])     Equipment Recommendations  None recommended by PT    Recommendations for Other Services       Precautions / Restrictions Precautions Precautions: Knee Precaution Booklet Issued: No Precaution Comments: Reviewed no pillow under knee and precautions Required Braces or Orthoses: Knee Immobilizer - Right Knee Immobilizer - Right: On when out of bed or walking Restrictions Weight Bearing Restrictions: No RLE Weight Bearing: Weight bearing as tolerated    Mobility  Bed Mobility Overal bed mobility: Needs Assistance Bed Mobility: Supine to Sit     Supine to sit: HOB elevated;Supervision     General bed mobility comments: Use of rail.    Transfers Overall transfer level: Needs assistance Equipment used: Rolling walker (2 wheeled) Transfers: Sit to/from Stand Sit to Stand: Min assist         General transfer comment: Min A to power to standing with cues for hand placement/technique as pt pulling up on RW. Stood from [employer].  Ambulation/Gait Ambulation/Gait assistance: Min assist Ambulation Distance (Feet): 60 Feet Assistive device: Rolling walker (2 wheeled)   Gait velocity: decreased Gait velocity interpretation: Below normal speed for age/gender General Gait Details: Slow, unsteady gait with short stance time on R LE. Cues for gait symmetry and to soften heel strike on L LE.     Stairs            Wheelchair Mobility    Modified Rankin (Stroke Patients Only)       Balance Overall balance assessment: Needs  assistance Sitting-balance support: Feet supported;No upper extremity supported Sitting balance-Leahy Scale: Good Sitting balance - Comments: Able to perform pericare without assist.      Standing balance-Leahy Scale: Poor                      Cognition Arousal/Alertness: Awake/alert Behavior During Therapy: WFL for tasks assessed/performed Overall Cognitive Status: Within Functional Limits for tasks assessed                      Exercises Total Joint Exercises Ankle Circles/Pumps: Both;10 reps;Supine;AROM Quad Sets: 10 reps;Supine;AROM;Right Towel Squeeze: AROM;Both;10 reps;Supine Short Arc Quad: AROM;Right;10 reps;Supine Heel Slides: AROM;AAROM;Right;10 reps;Supine Hip ABduction/ADduction: AROM;Right;10 reps;Supine Straight Leg Raises: AROM;Right;10 reps;Supine Goniometric ROM: 76 degrees R knee flexion.      General Comments        Pertinent Vitals/Pain Pain Assessment: Faces Faces Pain Scale: Hurts a little bit Pain Descriptors / Indicators: Sore;Operative site guarding Pain Intervention(s): Monitored during session;Repositioned;Premedicated before session;Ice applied    Home Living                      Prior Function            PT Goals (current goals can now be found in the care plan section) Acute Rehab PT Goals Patient Stated Goal: to go to rehab, "I am going to be the fastest one to recover there ever was" Potential to Achieve Goals: Good Progress towards PT goals: Progressing toward  goals    Frequency    7X/week      PT Plan Current plan remains appropriate    Co-evaluation             End of Session Equipment Utilized During Treatment: Gait belt Activity Tolerance: Patient tolerated treatment well Patient left: in chair;with call bell/phone within reach;with family/visitor present     Time: [id number] PT Time Calculation (min) (ACUTE ONLY): 23 min  Charges:  $Gait Training: 8-22 mins $Therapeutic Exercise:  8-22 mins                    G Codes:      Cristela Blue 11/04/15, 11:00 AM  Governor Rooks, PTA pager [phone]

## 2015-10-05 NOTE — Consult Note (Signed)
            [facility] CM Primary Care Navigator  10/05/2015  Wanda [name] April 19, 1940 [id number]  Seen patient at the bedside to identify possible discharge needs. Patient states having constant severe pain on her knees that led to this admission/ surgery.  She confirms Dr. [name] Copland at [facility] as the primary care provider.    Patient shared using [facility] to obtain medications with no problem and uses "pill box" system in managing her own medications at home.   Patient states living alone and being independent with self care prior to surgery. Son-in law Wanda [name]) will be providing transportation to doctors' appointments until she gets better. Her daughter Wanda [name]) as well as son-in law are her primary caregivers at home. Patient will be going to [facility] skilled nursing facility when discharged from the hospital for short term rehab as stated.  Patient voiced understanding to call primary care provider's office for a post discharge follow-up appointment when discharged from SNF. Patient letter given for reminder.   Denies further needs or concerns at this time.         For additional questions please contact:  Edwena Felty A. Antario Yasuda, BSN, RN-BC [facility] PRIMARY CARE Navigator Cell: [phone]

## 2015-10-05 NOTE — Plan of Care (Signed)
Problem: Activity: Goal: Risk for activity intolerance will decrease Outcome: Progressing OOB to chair for 2 hours , tolerated well  Problem: Physical Regulation: Goal: Postoperative complications will be avoided or minimized Outcome: Progressing No complications noted  Problem: Pain Management: Goal: Pain level will decrease with appropriate interventions Outcome: Progressing Pain managed with Dilaudid with full relief

## 2015-10-05 NOTE — Progress Notes (Signed)
Inpatient Diabetes Program Recommendations  AACE/ADA: New Consensus Statement on Inpatient Glycemic Control (2015)  Target Ranges:  Prepandial:   less than 140 mg/dL      Peak postprandial:   less than 180 mg/dL (1-2 hours)      Critically ill patients:  140 - 180 mg/dL   Lab Results  Component Value Date   GLUCAP 219 (H) 10/05/2015   HGBA1C 8.5 (H) 08/16/2015    Review of Glycemic Control:  Results for [name], Wanda (MRN [id number]) as of 10/05/2015 12:42  Ref. Range 10/04/2015 12:07 10/04/2015 16:31 10/04/2015 20:59 10/05/2015 06:20 10/05/2015 11:15  Glucose-Capillary Latest Ref Range: 65 - 99 mg/dL 111 (H) 227 (H) 259 (H) 280 (H) 219 (H)   Diabetes history: Type 2 diabetes Outpatient Diabetes medications: Lantus 20 units daily Current orders for Inpatient glycemic control:  Novolog moderate tid with meals, Lantus 15 units q HS  Inpatient Diabetes Program Recommendations:     Please consider increasing Lantus to 22 units q HS.  Thanks, Adah Perl, RN, BC-ADM Inpatient Diabetes Coordinator Pager [phone] (8a-5p)

## 2015-10-05 NOTE — Progress Notes (Signed)
PT Cancellation Note  Patient Details Name: Wanda [name] MRN: [id number] DOB: May 09, 1940   Cancelled Treatment:    Reason Eval/Treat Not Completed: Pain limiting ability to participate (Pt reports pain as 10/10 will defer pm treat.  )   Wanda [name] Eli Hose 10/05/2015, 4:48 PM  Governor Rooks, PTA pager [phone]

## 2015-10-05 NOTE — Progress Notes (Signed)
Subjective: 1 Day Post-Op Procedure(s) (LRB): TOTAL KNEE ARTHROPLASTY (Right) Patient reports pain as mild.taking by mouth/voiding okay.   Objective: Vital signs in last 24 hours: Temp:  [97.4 F (36.3 C)-98.2 F (36.8 C)] 98 F (36.7 C) (09/26 0900) Pulse Rate:  [71-104] 99 (09/26 0900) Resp:  [15] 15 (09/26 0446) BP: (129-164)/(55-73) 137/55 (09/26 0900) SpO2:  [93 %-99 %] 98 % (09/26 0900) Weight:  [78.5 kg (173 lb 1 oz)] 78.5 kg (173 lb 1 oz) (09/26 0900)  Intake/Output from previous day: 09/25 0701 - 09/26 0700 In: 800 [I.V.:800] Out: 150 [Urine:100; Blood:50] Intake/Output this shift: No intake/output data recorded.   Recent Labs  10/05/15 0345  HGB 12.1    Recent Labs  10/05/15 0345  WBC 11.1*  RBC 3.99  HCT 35.9*  PLT 210    Recent Labs  10/05/15 0345  NA 137  K 3.7  CL 106  CO2 21*  BUN 12  CREATININE 0.75  GLUCOSE 269*  CALCIUM 9.6   No results for input(s): LABPT, INR in the last 72 hours. Right knee exam: Neurovascular intact Sensation intact distally Intact pulses distally Dorsiflexion/Plantar flexion intact Incision: dressing C/D/I Compartment soft  Assessment/Plan: 1 Day Post-Op Procedure(s) (LRB): TOTAL KNEE ARTHROPLASTY (Right)  Plan: Eliquis 2.5 mg twice daily 3 weeks postop for DVT prophylaxis. The patient is allergic to aspirin. Up with therapy Discharge to SNFprobably tomorrow.  Wanda [name] G 10/05/2015, 9:21 AM

## 2015-10-06 ENCOUNTER — Encounter (HOSPITAL_COMMUNITY): Payer: Self-pay

## 2015-10-06 DIAGNOSIS — S86191A Other injury of other muscle(s) and tendon(s) of posterior muscle group at lower leg level, right leg, initial encounter: Secondary | ICD-10-CM | POA: Diagnosis not present

## 2015-10-06 DIAGNOSIS — E119 Type 2 diabetes mellitus without complications: Secondary | ICD-10-CM | POA: Diagnosis not present

## 2015-10-06 DIAGNOSIS — Z96651 Presence of right artificial knee joint: Secondary | ICD-10-CM | POA: Diagnosis not present

## 2015-10-06 DIAGNOSIS — Z471 Aftercare following joint replacement surgery: Secondary | ICD-10-CM | POA: Diagnosis not present

## 2015-10-06 DIAGNOSIS — E669 Obesity, unspecified: Secondary | ICD-10-CM | POA: Diagnosis not present

## 2015-10-06 DIAGNOSIS — Z85828 Personal history of other malignant neoplasm of skin: Secondary | ICD-10-CM | POA: Diagnosis not present

## 2015-10-06 DIAGNOSIS — M170 Bilateral primary osteoarthritis of knee: Secondary | ICD-10-CM | POA: Diagnosis not present

## 2015-10-06 DIAGNOSIS — N3941 Urge incontinence: Secondary | ICD-10-CM | POA: Diagnosis not present

## 2015-10-06 DIAGNOSIS — R531 Weakness: Secondary | ICD-10-CM | POA: Diagnosis not present

## 2015-10-06 DIAGNOSIS — E042 Nontoxic multinodular goiter: Secondary | ICD-10-CM | POA: Diagnosis not present

## 2015-10-06 DIAGNOSIS — E785 Hyperlipidemia, unspecified: Secondary | ICD-10-CM | POA: Diagnosis not present

## 2015-10-06 DIAGNOSIS — I10 Essential (primary) hypertension: Secondary | ICD-10-CM | POA: Diagnosis not present

## 2015-10-06 DIAGNOSIS — M810 Age-related osteoporosis without current pathological fracture: Secondary | ICD-10-CM | POA: Diagnosis not present

## 2015-10-06 DIAGNOSIS — H269 Unspecified cataract: Secondary | ICD-10-CM | POA: Diagnosis not present

## 2015-10-06 LAB — CBC
HEMATOCRIT: 32.4 % — AB (ref 36.0–46.0)
HEMOGLOBIN: 10.8 g/dL — AB (ref 12.0–15.0)
MCH: 30.3 pg (ref 26.0–34.0)
MCHC: 33.3 g/dL (ref 30.0–36.0)
MCV: 91 fL (ref 78.0–100.0)
Platelets: 202 10*3/uL (ref 150–400)
RBC: 3.56 MIL/uL — ABNORMAL LOW (ref 3.87–5.11)
RDW: 13.2 % (ref 11.5–15.5)
WBC: 13.3 10*3/uL — AB (ref 4.0–10.5)

## 2015-10-06 LAB — GLUCOSE, CAPILLARY
GLUCOSE-CAPILLARY: 194 mg/dL — AB (ref 65–99)
Glucose-Capillary: 193 mg/dL — ABNORMAL HIGH (ref 65–99)

## 2015-10-06 MED ORDER — INSULIN GLARGINE 100 UNIT/ML SOLOSTAR PEN: 20.0000 [IU] | PEN_INJECTOR | Freq: Every day | SUBCUTANEOUS | 5 refills | Status: DC

## 2015-10-06 NOTE — Progress Notes (Signed)
Physical Therapy Treatment Patient Details Name: Wanda [name] MRN: [id number] DOB: 10-Aug-1940 Today's Date: 10/06/2015    History of Present Illness Patient is a 75 y/o female with hx of skin ca, HTN, DM presents s/p right TKA.    PT Comments    Pt performed improved gait and reviewed supine HEP.  Pt to d/c to rehab today at 1:00pm.    Follow Up Recommendations  SNF ([facility])     Equipment Recommendations  None recommended by PT    Recommendations for Other Services OT consult     Precautions / Restrictions Precautions Precautions: Knee Precaution Booklet Issued: No Precaution Comments: Reviewed no pillow under knee and precautions Required Braces or Orthoses: Knee Immobilizer - Right Knee Immobilizer - Right: On when out of bed or walking Restrictions Weight Bearing Restrictions: No RLE Weight Bearing: Weight bearing as tolerated    Mobility  Bed Mobility               General bed mobility comments: pt received in recliner chair on arrival   Transfers Overall transfer level: Needs assistance Equipment used: Rolling walker (2 wheeled) Transfers: Sit to/from Stand Sit to Stand: Min guard         General transfer comment: Cues for hand placement and forward advancement of RLE to decrease pain.    Ambulation/Gait Ambulation/Gait assistance: Min guard Ambulation Distance (Feet): 85 Feet Assistive device: Rolling walker (2 wheeled) Gait Pattern/deviations: Step-through pattern;Decreased stride length;Shuffle;Trunk flexed;Decreased stance time - right Gait velocity: decreased Gait velocity interpretation: Below normal speed for age/gender General Gait Details: Cues for sequencing and trunk control.  Cues for R heel strike to improve knee extesionsion in stance phase.  Cues for gait symmetry.   Stairs            Wheelchair Mobility    Modified Rankin (Stroke Patients Only)       Balance Overall balance assessment: Needs assistance   Sitting  balance-Leahy Scale: Good       Standing balance-Leahy Scale: Poor                      Cognition Arousal/Alertness: Awake/alert Behavior During Therapy: WFL for tasks assessed/performed Overall Cognitive Status: Within Functional Limits for tasks assessed                      Exercises Total Joint Exercises Ankle Circles/Pumps: Both;10 reps;Supine;AROM Quad Sets: 10 reps;Supine;AROM;Right Towel Squeeze: AROM;Both;10 reps;Supine Short Arc Quad: AROM;Right;10 reps;Supine Heel Slides: AROM;AAROM;Right;10 reps;Supine Hip ABduction/ADduction: AROM;Right;10 reps;Supine Straight Leg Raises: AROM;Right;10 reps;Supine Goniometric ROM: 78 degrees flexion R knee.      General Comments        Pertinent Vitals/Pain Pain Assessment: 0-10 Pain Score: 4  Pain Location: R knee Pain Descriptors / Indicators: Sore;Operative site guarding Pain Intervention(s): Limited activity within patient's tolerance;Monitored during session;Repositioned    Home Living                      Prior Function            PT Goals (current goals can now be found in the care plan section) Acute Rehab PT Goals Patient Stated Goal: "I feel alot better today than I did." Potential to Achieve Goals: Good Progress towards PT goals: Progressing toward goals    Frequency    7X/week      PT Plan Current plan remains appropriate    Co-evaluation  End of Session Equipment Utilized During Treatment: Gait belt Activity Tolerance: Patient tolerated treatment well Patient left: in chair;with call bell/phone within reach;with family/visitor present     Time: 1223-1238 PT Time Calculation (min) (ACUTE ONLY): 15 min  Charges:  $Therapeutic Activity: 8-22 mins                    G Codes:      Cristela Blue 10-19-15, 12:44 PM  Governor Rooks, PTA pager [phone]

## 2015-10-06 NOTE — Discharge Summary (Signed)
Patient ID: Wanda [name] MRN: [id number] DOB/AGE: Feb 22, 1940 75 y.o.  Admit date: 10/04/2015 Discharge date: 10/06/2015  Admission Diagnoses:  Principal Problem:   Primary osteoarthritis of right knee   Discharge Diagnoses:  Same  Past Medical History:  Diagnosis Date  . Arthritis   . Cataract   . Diabetes mellitus   . Hypertension   . Skin cancer    Removed from face  . Urgency of urination   . Urinary leakage     Surgeries: Procedure(s):Right TOTAL KNEE ARTHROPLASTY on 10/04/2015    Discharged Condition: Improved  Hospital Course: Wanda [name] is an 75 y.o. female who was admitted 10/04/2015 for operative treatment ofPrimary osteoarthritis of right knee. Patient has severe unremitting pain that affects sleep, daily activities, and work/hobbies. After pre-op clearance the patient was taken to the operating room on 10/04/2015 and underwent  Procedure(s): Right TOTAL KNEE ARTHROPLASTY.    Patient was given perioperative antibiotics:  Anti-infectives    Start     Dose/Rate Route Frequency Ordered Stop   10/04/15 1200  ceFAZolin (ANCEF) IVPB 2g/100 mL premix     2 g 200 mL/hr over 30 Minutes Intravenous Every 6 hours 10/04/15 1152 10/04/15 1729   10/04/15 0601  ceFAZolin (ANCEF) IVPB 2g/100 mL premix     2 g 200 mL/hr over 30 Minutes Intravenous On call to O.R. 10/04/15 0602 10/04/15 0750       Patient was given sequential compression devices, early ambulation, and chemoprophylaxis to prevent DVT.  Patient benefited maximally from hospital stay and there were no complications.    Recent vital signs:  Patient Vitals for the past 24 hrs:  BP Temp Temp src Pulse Resp SpO2 Height Weight  10/06/15 0500 (!) 162/68 98 F (36.7 C) Oral 99 17 94 % - -  10/05/15 2136 (!) 145/59 98.2 F (36.8 C) Oral 78 18 98 % - -  10/05/15 1259 (!) 134/51 98.2 F (36.8 C) Oral 84 16 97 % - -  10/05/15 0900 (!) 137/55 98 F (36.7 C) Oral 99 - 98 % 5' (1.524 m) 78.5 kg (173 lb 1 oz)      Recent laboratory studies:   Recent Labs  10/05/15 0345 10/06/15 0543  WBC 11.1* 13.3*  HGB 12.1 10.8*  HCT 35.9* 32.4*  PLT 210 202  NA 137  --   K 3.7  --   CL 106  --   CO2 21*  --   BUN 12  --   CREATININE 0.75  --   GLUCOSE 269*  --   CALCIUM 9.6  --      Discharge Medications:     Medication List    TAKE these medications   acetaminophen 325 MG tablet Commonly known as:  TYLENOL Take 650 mg by mouth every 6 (six) hours as needed for mild pain.   apixaban 2.5 MG Tabs tablet Commonly known as:  ELIQUIS Take 1 tablet (2.5 mg total) by mouth 2 (two) times daily. Take x 3 weeks post op   BD PEN NEEDLE NANO U/F 32G X 4 MM Misc Generic drug:  Insulin Pen Needle USE TO INJECT INSULIN ONCE DAILY   COD LIVER OIL PO Take 1 capsule by mouth daily as needed. Reported on 05/31/2015   glucose blood test strip Test blood sugar 3 times a day. Dx code: 250.00   Insulin Glargine 100 UNIT/ML Solostar Pen Commonly known as:  LANTUS SOLOSTAR Inject 20 Units into the skin daily at 10 pm. What changed:  how much to take   INSULIN SYRINGE .5CC/31GX5/16" 31G X 5/16" 0.5 ML Misc Use to inject insulin 1 time daily.   niacin 500 MG CR tablet Commonly known as:  NIASPAN Take 1 tablet (500 mg total) by mouth at bedtime. Increase to 2 tablets after 1 month   ONE TOUCH ULTRA MINI w/Device Kit Use to test blood sugar daily as instructed. Dx: E11.65   oxyCODONE-acetaminophen 5-325 MG tablet Commonly known as:  PERCOCET/ROXICET Take 1-2 tablets by mouth every 6 (six) hours as needed for severe pain.   tiZANidine 2 MG tablet Commonly known as:  ZANAFLEX Take 1 tablet (2 mg total) by mouth every 8 (eight) hours as needed for muscle spasms.   vitamin E 200 UNIT capsule Take 200 Units by mouth daily. Reported on 05/31/2015       Diagnostic Studies: Dg Chest 2 View  Result Date: 09/24/2015 CLINICAL DATA:  Preop for right total knee replacement. EXAM: CHEST  2 VIEW  COMPARISON:  Radiographs of June 19, 2011. FINDINGS: The heart size and mediastinal contours are within normal limits. Both lungs are clear. The visualized skeletal structures are unremarkable. IMPRESSION: No active cardiopulmonary disease. Electronically Signed   By: Marijo Conception, M.D.   On: 09/24/2015 16:05    Disposition: Skilled nursing facility  Discharge Instructions    CPM    Complete by:  As directed    Continuous passive motion machine (CPM):      Use the CPM from 0 to 60 for 8 hours per day.      You may increase by 5-10 per day.  You may break it up into 2 or 3 sessions per day.      Use CPM for 1-2 weeks or until you are told to stop.   Call MD / Call 911    Complete by:  As directed    If you experience chest pain or shortness of breath, CALL 911 and be transported to the hospital emergency room.  If you develope a fever above 101 F, pus (white drainage) or increased drainage or redness at the wound, or calf pain, call your surgeon's office.   Constipation Prevention    Complete by:  As directed    Drink plenty of fluids.  Prune juice may be helpful.  You may use a stool softener, such as Colace (over the counter) 100 mg twice a day.  Use MiraLax (over the counter) for constipation as needed.   Diet Carb Modified    Complete by:  As directed    Do not put a pillow under the knee. Place it under the heel.    Complete by:  As directed    Increase activity slowly as tolerated    Complete by:  As directed    Weight bearing as tolerated    Complete by:  As directed    Laterality:  right   Extremity:  Lower      Follow-up Information    GRAVES,JOHN L, MD. Schedule an appointment as soon as possible for a visit in 2 weeks.   Specialty:  Orthopedic Surgery Contact information: [place] [address] [phone]            Signed: Erlene Senters 10/06/2015, 8:31 AM

## 2015-10-06 NOTE — Progress Notes (Addendum)
SW reached out to [employer] who states that the pt is welcomed to come now. She confirms that she has received discharge summary. SW will arrange transportation for 1:00pm. SW made patient aware. SW made nurse aware.  Nurse Report Number: [id number]  Tilda Burrow, MSW [phone]

## 2015-10-06 NOTE — Progress Notes (Signed)
Patient discharged to rehab center [facility], discharge packet faxed by MSW. Patient transported by [facility]. Family aware of transfer.    Kikue Gerhart, Tivis Ringer, RN

## 2015-10-06 NOTE — Plan of Care (Signed)
Problem: Physical Regulation: Goal: Will remain free from infection Outcome: Progressing No s/s of infection noted  Problem: Bowel/Gastric: Goal: Will not experience complications related to bowel motility Outcome: Progressing No bowel issues reported  Problem: Activity: Goal: Will remain free from falls Outcome: Progressing No fall or injury this shift  Problem: Physical Regulation: Goal: Postoperative complications will be avoided or minimized Outcome: Progressing No complications noted  Problem: Pain Management: Goal: Pain level will decrease with appropriate interventions Outcome: Progressing Denies pain

## 2015-10-06 NOTE — Progress Notes (Signed)
Subjective: 2 Days Post-Op Procedure(s) (LRB): TOTAL KNEE ARTHROPLASTY (Right) Patient reports pain as moderate. Taking by mouth, voiding okay. Progressing with physical therapy.   Objective: Vital signs in last 24 hours: Temp:  [98 F (36.7 C)-98.2 F (36.8 C)] 98 F (36.7 C) (09/27 0500) Pulse Rate:  [78-99] 99 (09/27 0500) Resp:  [16-18] 17 (09/27 0500) BP: (134-162)/(51-68) 162/68 (09/27 0500) SpO2:  [94 %-98 %] 94 % (09/27 0500) Weight:  [78.5 kg (173 lb 1 oz)] 78.5 kg (173 lb 1 oz) (09/26 0900)  Intake/Output from previous day: 09/26 0701 - 09/27 0700 In: 740 [P.O.:740] Out: -  Intake/Output this shift: No intake/output data recorded.   Recent Labs  10/05/15 0345 10/06/15 0543  HGB 12.1 10.8*    Recent Labs  10/05/15 0345 10/06/15 0543  WBC 11.1* 13.3*  RBC 3.99 3.56*  HCT 35.9* 32.4*  PLT 210 202    Recent Labs  10/05/15 0345  NA 137  K 3.7  CL 106  CO2 21*  BUN 12  CREATININE 0.75  GLUCOSE 269*  CALCIUM 9.6   No results for input(s): LABPT, INR in the last 72 hours. Right knee exam: Neurovascular intact Sensation intact distally Intact pulses distally Dorsiflexion/Plantar flexion intact Compartment soft Dressing clean and dry. Assessment/Plan: 2 Days Post-Op Procedure(s) (LRB): TOTAL KNEE ARTHROPLASTY (Right) Diabetes Plan: Up with therapy Discharge to SNF Eliquis 2.5 mg twice daily 3 weeks for DVT prophylaxis. Weightbearing as tolerated on the right. Follow-up with Dr. Berenice Primas in 2 weeks.  Cranston Koors G 10/06/2015, 8:25 AM

## 2015-10-08 DIAGNOSIS — M170 Bilateral primary osteoarthritis of knee: Secondary | ICD-10-CM | POA: Diagnosis not present

## 2015-10-08 DIAGNOSIS — Z471 Aftercare following joint replacement surgery: Secondary | ICD-10-CM | POA: Diagnosis not present

## 2015-10-08 DIAGNOSIS — E785 Hyperlipidemia, unspecified: Secondary | ICD-10-CM | POA: Diagnosis not present

## 2015-10-08 DIAGNOSIS — E119 Type 2 diabetes mellitus without complications: Secondary | ICD-10-CM | POA: Diagnosis not present

## 2015-10-14 ENCOUNTER — Ambulatory Visit: Payer: Medicare Other | Attending: Orthopedic Surgery | Admitting: Physical Therapy

## 2015-10-14 DIAGNOSIS — M25661 Stiffness of right knee, not elsewhere classified: Secondary | ICD-10-CM | POA: Diagnosis not present

## 2015-10-14 DIAGNOSIS — R2689 Other abnormalities of gait and mobility: Secondary | ICD-10-CM | POA: Diagnosis not present

## 2015-10-14 DIAGNOSIS — R262 Difficulty in walking, not elsewhere classified: Secondary | ICD-10-CM | POA: Diagnosis not present

## 2015-10-14 DIAGNOSIS — M25561 Pain in right knee: Secondary | ICD-10-CM | POA: Diagnosis not present

## 2015-10-15 NOTE — Therapy (Signed)
[facility] [address]  [place], [place], [address] Phone: [phone]   Fax:  [phone]  Physical Therapy Evaluation  Patient Details  Name: Wanda [name] MRN: [id number] Date of Birth: 12-25-1940 Referring Provider: Alta Corning, MD  Encounter Date: 10/14/2015      PT End of Session - 10/14/15 1404    Visit Number 1   Number of Visits 16   Date for PT Re-Evaluation 11/26/15   PT Start Time [id number]   PT Stop Time 1400   PT Time Calculation (min) 45 min   Activity Tolerance Patient tolerated treatment well;Patient limited by pain   Behavior During Therapy [facility] for tasks assessed/performed      Past Medical History:  Diagnosis Date  . Arthritis   . Cataract   . Diabetes mellitus   . Hypertension   . Skin cancer    Removed from face  . Urgency of urination   . Urinary leakage     Past Surgical History:  Procedure Laterality Date  . ABDOMINAL HYSTERECTOMY  early 80's   total  . CARPAL TUNNEL RELEASE Bilateral 1980 and 1981   both hands  . Fatty Tumor Excision    . TONSILLECTOMY AND ADENOIDECTOMY  age 45  . TOTAL KNEE ARTHROPLASTY Right 10/04/2015  . TOTAL KNEE ARTHROPLASTY Right 10/04/2015   Procedure: TOTAL KNEE ARTHROPLASTY;  Surgeon: Dorna Leitz, MD;  Location: [facility];  Service: Orthopedics;  Laterality: Right;  . TUBAL LIGATION      There were no vitals filed for this visit.       Subjective Assessment - 10/14/15 1319    Subjective Pt s/p R TKR on 10/04/15 with 2 night post-op stay, followed by 6 day stay at [facility], returning home on Tuesday. Has been using home CPM for 6 hrs per day.   Pertinent History R TKR 10/04/15   Patient Stated Goals "to get 120 dg (knee flexion)"   Currently in Pain? Yes   Pain Score 6   Least 2/10, Avg 6/10, Worst 10/10   Pain Location Knee   Pain Orientation Right;Medial;Lateral;Posterior   Pain Descriptors / Indicators Aching   Pain Type Surgical pain    Pain Radiating Towards n/a   Pain Onset 1 to 4 weeks ago   Pain Frequency Constant   Aggravating Factors  sitting   Pain Relieving Factors elevation   Effect of Pain on Daily Activities pain present during activities but does not prevent her from doing waht she needs to            [facility] PT Assessment - 10/14/15 1315      Assessment   Medical Diagnosis R TKR   Referring Provider Alta Corning, MD   Onset Date/Surgical Date 10/04/15   Next MD Visit 10/22/15   Prior Therapy short stay SNF (6 days)     Balance Screen   Has the patient fallen in the past 6 months No   Has the patient had a decrease in activity level because of a fear of falling?  No   Is the patient reluctant to leave their home because of a fear of falling?  No     Home Environment   Living Environment Private residence   Living Arrangements Alone   Type of [place] to enter   Entrance Stairs-Number of Steps 1 (curb)   Home Layout One level   [place] - 2  wheels;Shower seat - built in     Prior Function   Level of Independence Independent     Observation/Other Assessments   Focus on Therapeutic Outcomes (FOTO)  Knee - 47% (53% limitation); predicted 66% (34% limitation)     ROM / Strength   AROM / PROM / Strength AROM;PROM;Strength     AROM   AROM Assessment Site Knee   Right/Left Knee Right;Left   Right Knee Extension 14  LAQ   Right Knee Flexion 78   Left Knee Extension 0   Left Knee Flexion 128     PROM   PROM Assessment Site Knee   Right/Left Knee Right   Right Knee Extension 7  supported extension   Right Knee Flexion 82     Strength   Strength Assessment Site Hip;Knee   Right/Left Hip Right;Left   Right Hip Flexion 3+/5   Right Hip Extension 3+/5   Right Hip ABduction 4-/5   Right Hip ADduction 4-/5   Left Hip Flexion 4+/5   Left Hip Extension 4/5   Left Hip ABduction 5/5   Left Hip ADduction 4+/5   Right/Left Knee Right;Left   Right  Knee Flexion 3+/5   Right Knee Extension 3-/5   Left Knee Flexion 4+/5   Left Knee Extension 4+/5     Flexibility   Soft Tissue Assessment /Muscle Length yes   Hamstrings WNL to excessive flexibility on L; mild tightness on R     Palpation   Patella mobility R knee very limited sup/inf with pain, mildly limited med/lat   Palpation comment ttp over R patella and lat joint line     Ambulation/Gait   Gait Pattern Antalgic;Decreased weight shift to right;Decreased hip/knee flexion - right          Today's Treatment  TherEx Supine R HS + Gastroc stretch with strap 2x30' AAROM R Heel Slides with strap x10 Seated R Heel slides with towel x10  (pt instructed to use plastic bag if completing this exercise on carpet) Seated AAROM R Heel slide using L LE assist x3           PT Education - 10/14/15 1400    Education provided Yes   Education Details PT eval findings, POC and initial HEP (pt to also continue with prior HEP from hospital)   Person(s) Educated Patient   Methods Explanation;Demonstration;Handout   Comprehension Verbalized understanding;Returned demonstration;Need further instruction             PT Long Term Goals - 10/14/15 1400      PT LONG TERM GOAL #1   Title Independent with HEP by 11/26/15   Status New     PT LONG TERM GOAL #2   Title R knee AROM 3-120 dg to allow for normal gait mechanics by 11/26/15   Status New     PT LONG TERM GOAL #3   Title R hip and knee strength >/= 4/5 for improved function by 11/26/15   Status New     PT LONG TERM GOAL #4   Title Pt will ambulate with normal gait pattern w/o AD by 11/26/15   Status New               Plan - 10/14/15 1400    Clinical Impression Statement Wanda [name] is a 75 y/o female who presents to OP PT 10 days s/p R TKR on 10/04/15. Pt with 2 day acute care stay followed by 6 day SNF stay returning home on 10/12/15. Pt presents  to PT using RW for ambulation demonstrating an antalgic gait pattern  with decreased step length, decreased R hip and knee flexion during swing through, and decreased weight shift to R, decreased heel strike and knee extension on weight acceptance with knee flexed during stance phase of gait, with slight fwd flexed posture. Pain currently 6/10 and present during all activities; with least pain 2/10, avg pain 6/10 and worst up to 10/10. Assessment reveals R knee AROM 14-78 and PROM 7-82. Mild tightness noted in R hamstrings and gastrocs, along with decreased patellar mobility. MMT reveals moderate weakness in R hip and knee (refer to above MMT). POC will focus on improving LE soft tissue pliability, increasing R knee ROM, core/LE strengthening and stability training, gait training working on weaning from RW to [facility] to no AD, with manual therapy PRN for ROM/pain/edema and modalities PRN for pain/edema.   Rehab Potential Good   PT Frequency 3x / week  anticipate decrease to 2x/wk after 3-4 wks   PT Duration 6 weeks   PT Treatment/Interventions Patient/family education;Therapeutic exercise;Neuromuscular re-education;Gait training;Stair training;Manual techniques;Passive range of motion;Scar mobilization;Electrical Stimulation;Cryotherapy;Vasopneumatic Device;Taping;Functional mobility training;Therapeutic activities;ADLs/Self Care Home Management   PT Next Visit Plan TKR protocol, Assess readiness to wean to [facility]   Consulted and Agree with Plan of Care Patient      Patient will benefit from skilled therapeutic intervention in order to improve the following deficits and impairments:  Pain, Decreased range of motion, Impaired flexibility, Decreased strength, Decreased scar mobility, Difficulty walking, Abnormal gait, Decreased activity tolerance, Decreased knowledge of use of DME  Visit Diagnosis: Acute pain of right knee  Stiffness of right knee, not elsewhere classified  Difficulty in walking, not elsewhere classified  Other abnormalities of gait and mobility       G-Codes - 10-Nov-2015 1400    Functional Assessment Tool Used Knee FOTO = 47% (53% limitation)   Functional Limitation Mobility: Walking and moving around   Mobility: Walking and Moving Around Current Status [id number]) At least 40 percent but less than 60 percent impaired, limited or restricted   Mobility: Walking and Moving Around Goal Status [phone]) At least 20 percent but less than 40 percent impaired, limited or restricted      Problem List Patient Active Problem List   Diagnosis Date Noted  . Primary osteoarthritis of right knee 10/04/2015  . Osteoporosis 08/23/2015  . Multinodular goiter (nontoxic) 04/07/2014  . Diabetes mellitus type 2, uncontrolled, without complications ([place]) [id number]  . Insulin adverse reaction 09/27/2013  . Senile nuclear sclerosis 08/14/2012  . HTN, goal below 130/80 03/10/2011  . Hyperlipidemia with target LDL less than 70 03/10/2011  . Obesity (BMI 30.0-34.9) 03/10/2011  . Health care maintenance 03/10/2011    Percival Spanish, PT, MPT 10/15/2015, 9:16 AM  [facility] [address]  [place], [place], [address] Phone: [phone]   Fax:  [phone]  Name: Wanda [name] MRN: [id number] Date of Birth: 1940/07/31

## 2015-10-18 ENCOUNTER — Ambulatory Visit: Payer: Medicare Other

## 2015-10-18 ENCOUNTER — Ambulatory Visit (INDEPENDENT_AMBULATORY_CARE_PROVIDER_SITE_OTHER): Payer: Medicare Other | Admitting: Family Medicine

## 2015-10-18 ENCOUNTER — Encounter: Payer: Self-pay | Admitting: Family Medicine

## 2015-10-18 VITALS — BP 148/88 | HR 81 | Temp 98.2°F | Ht 61.5 in | Wt 166.8 lb

## 2015-10-18 DIAGNOSIS — M25561 Pain in right knee: Secondary | ICD-10-CM | POA: Diagnosis not present

## 2015-10-18 DIAGNOSIS — R262 Difficulty in walking, not elsewhere classified: Secondary | ICD-10-CM

## 2015-10-18 DIAGNOSIS — R2689 Other abnormalities of gait and mobility: Secondary | ICD-10-CM

## 2015-10-18 DIAGNOSIS — IMO0001 Reserved for inherently not codable concepts without codable children: Secondary | ICD-10-CM

## 2015-10-18 DIAGNOSIS — E1165 Type 2 diabetes mellitus with hyperglycemia: Secondary | ICD-10-CM | POA: Diagnosis not present

## 2015-10-18 DIAGNOSIS — Z794 Long term (current) use of insulin: Secondary | ICD-10-CM

## 2015-10-18 DIAGNOSIS — M25661 Stiffness of right knee, not elsewhere classified: Secondary | ICD-10-CM | POA: Diagnosis not present

## 2015-10-18 DIAGNOSIS — D62 Acute posthemorrhagic anemia: Secondary | ICD-10-CM | POA: Diagnosis not present

## 2015-10-18 NOTE — Progress Notes (Signed)
[place] at [facility] [address], [place], [place] [address] [phone]  Date:  10/18/2015   Name:  Wanda [name]   DOB:  08-Mar-1940   MRN:  [id number]  PCP:  Lamar Blinks, MD    Chief Complaint: Hospitalization Follow-up (Pt here for hosp f/u visit. )   History of Present Illness:  Wanda [name] is a 75 y.o. very pleasant female patient who presents with the following:  Seen by myself in August prior to a RIGHT knee replacement.   HPI:  History of poorly controled DM, HTN, obesity, high cholesterol.  Here today for pre-op clearance, planning to have a right knee replacement in September.  She has severe arthritis in her knees.  Will have the left knee eventually also. She is looking forward to getting her knee operation done in hopes that she can be more active again  She was inpt for her operation from 9/25 to 9/27, did well and was released to rehab where she spent about one week, she is now home. She is doing well but is still having some pain.  She is going to PT - just started last week ,and is seeing ortho on Friday for a recheck She is using pain medications as needed but hopes she will soon be off of them   Lab Results  Component Value Date   HGBA1C 8.5 (H) 08/16/2015   She is using 25 units of lantus right now. She stopped using glipizide a couple of months ago.  She does not use metformin. She has tried a lot of different medications. Had difficulty tolerating much of anything except for insulin She has lost a little weight recently.  Notes that her morning sugars are running 160- 190 approx  Wt Readings from Last 3 Encounters:  10/18/15 166 lb 12.8 oz (75.7 kg)  10/05/15 173 lb 1 oz (78.5 kg)  09/24/15 173 lb (78.5 kg)     Patient Active Problem List   Diagnosis Date Noted  . Primary osteoarthritis of right knee 10/04/2015  . Osteoporosis 08/23/2015  . Multinodular goiter (nontoxic) 04/07/2014  .  Diabetes mellitus type 2, uncontrolled, without complications ([place]) 44/81/8563  . Insulin adverse reaction 09/27/2013  . Senile nuclear sclerosis 08/14/2012  . HTN, goal below 130/80 03/10/2011  . Hyperlipidemia with target LDL less than 70 03/10/2011  . Obesity (BMI 30.0-34.9) 03/10/2011  . Health care maintenance 03/10/2011    Past Medical History:  Diagnosis Date  . Arthritis   . Cataract   . Diabetes mellitus   . Hypertension   . Skin cancer    Removed from face  . Urgency of urination   . Urinary leakage     Past Surgical History:  Procedure Laterality Date  . ABDOMINAL HYSTERECTOMY  early 80's   total  . CARPAL TUNNEL RELEASE Bilateral 1980 and 1981   both hands  . Fatty Tumor Excision    . TONSILLECTOMY AND ADENOIDECTOMY  age 34  . TOTAL KNEE ARTHROPLASTY Right 10/04/2015  . TOTAL KNEE ARTHROPLASTY Right 10/04/2015   Procedure: TOTAL KNEE ARTHROPLASTY;  Surgeon: Dorna Leitz, MD;  Location: [place];  Service: Orthopedics;  Laterality: Right;  . TUBAL LIGATION      Social History  Substance Use Topics  . Smoking status: Never Smoker  . Smokeless tobacco: Never Used  . Alcohol use 0.5 oz/week    1 drink(s) per week    Family History  Problem Relation Age of  Onset  . Pancreatitis Father     deceased 26  . Cancer    . Cancer Brother     GI  . Cancer Mother     liver  . Cancer Son     terminal kidney    Allergies  Allergen Reactions  . Ace Inhibitors Swelling and Cough    Pt had cough and diarrhea with first few doses of medication; also had swelling of right eyelid. Stopped medication on 03/17/11.  . Alendronate Sodium Other (See Comments)    Whole body hurt  . Aspirin Hives  . Glucotrol [Glipizide] Other (See Comments)    Migraine "color flashes" in L eye with dizziness and lightheadedness.  . Lipitor [Atorvastatin Calcium] Other (See Comments)    Whole body hurt  . Metformin And Related     Severe abdominal pain  . Shellfish Allergy     Intolerant  of fresh shellfish, reports the reaction is GI upset, denies hives, denies any swelling  Reports that she can tolerate canned seafood.     Medication list has been reviewed and updated.  Current Outpatient Prescriptions on File Prior to Visit  Medication Sig Dispense Refill  . acetaminophen (TYLENOL) 325 MG tablet Take 650 mg by mouth every 6 (six) hours as needed for mild pain.    Marland Kitchen apixaban (ELIQUIS) 2.5 MG TABS tablet Take 1 tablet (2.5 mg total) by mouth 2 (two) times daily. Take x 3 weeks post op 40 tablet 0  . BD PEN NEEDLE NANO U/F 32G X 4 MM MISC USE TO INJECT INSULIN ONCE DAILY 100 each 0  . Blood Glucose Monitoring Suppl (ONE TOUCH ULTRA MINI) w/Device KIT Use to test blood sugar daily as instructed. Dx: E11.65 1 each 0  . COD LIVER OIL PO Take 1 capsule by mouth daily as needed. Reported on 05/31/2015    . glucose blood test strip Test blood sugar 3 times a day. Dx code: 250.00 100 each 12  . Insulin Glargine (LANTUS SOLOSTAR) 100 UNIT/ML Solostar Pen Inject 20 Units into the skin daily at 10 pm. 15 mL 5  . Insulin Syringe-Needle U-100 (INSULIN SYRINGE .5CC/31GX5/16") 31G X 5/16" 0.5 ML MISC Use to inject insulin 1 time daily. 90 each 3  . niacin (NIASPAN) 500 MG CR tablet Take 1 tablet (500 mg total) by mouth at bedtime. Increase to 2 tablets after 1 month 180 tablet 2  . oxyCODONE-acetaminophen (PERCOCET/ROXICET) 5-325 MG tablet Take 1-2 tablets by mouth every 6 (six) hours as needed for severe pain. 60 tablet 0  . tiZANidine (ZANAFLEX) 2 MG tablet Take 1 tablet (2 mg total) by mouth every 8 (eight) hours as needed for muscle spasms. 50 tablet 0  . vitamin E 200 UNIT capsule Take 200 Units by mouth daily. Reported on 05/31/2015    . [DISCONTINUED] lisinopril (PRINIVIL,ZESTRIL) 20 MG tablet Take 20 mg by mouth daily.      No current facility-administered medications on file prior to visit.     Review of Systems:  As per HPI- otherwise negative.  No fever, chills, nausea,  vomiting, or heat from her knee   Physical Examination: Vitals:   10/18/15 1131 10/18/15 1137  BP: (!) 152/88 (!) 148/88  Pulse: 81   Temp: 98.2 F (36.8 C)    Vitals:   10/18/15 1131  Weight: 166 lb 12.8 oz (75.7 kg)  Height: 5' 1.5" (1.562 m)   Body mass index is 31.01 kg/m. Ideal Body Weight: Weight in (lb) to have  BMI = 25: 134.2  GEN: WDWN, NAD, Non-toxic, A & O x 3, overweight, looks well HEENT: Atraumatic, Normocephalic. Neck supple. No masses, No LAD. Ears and Nose: No external deformity. CV: RRR, No M/G/R. No JVD. No thrill. No extra heart sounds. PULM: CTA B, no wheezes, crackles, rhonchi. No retractions. No resp. distress. No accessory muscle use. ABD: S, NT, ND EXTR: No c/c/e PSYCH: Normally interactive. Conversant. Not depressed or anxious appearing.  Calm demeanor.  Using a walker following her operation.   Right knee displays a healing incision.   Assessment and Plan: Uncontrolled type 2 diabetes mellitus without complication, with long-term current use of insulin ([place]) - Plan: Hemoglobin A1C  Acute blood loss anemia - Plan: CBC here today to follow-up from recent hospital stay.  She is doing well s/p right total knee.  She does need to repeat a CBC and also will need an A1c  Soon- however it is a little early for an A1c. She plans to come in for labs only in about one month to check these 2 things.  Discussed her insulin dose- she may go up by 2 units if necessary to keep fasting sugar under 150.  She will monitor this closely   Signed Lamar Blinks, MD

## 2015-10-18 NOTE — Patient Instructions (Signed)
It was great to see you today- I am glad that you are doing well from your knee surgery.  If your fasting blood sugar is running higher than 150 it would be ok to increase your lantus by 2 units to 27 units a day. I'll order labs for you to have done in about one month prior to your next endocrinology appointment

## 2015-10-18 NOTE — Therapy (Addendum)
[facility] [address]  [place], [place], [address] Phone: [phone]   Fax:  [phone]  Physical Therapy Treatment  Patient Details  Name: Wanda [name] MRN: [id number] Date of Birth: 30-Sep-1940 Referring Provider: Alta Corning, MD  Encounter Date: 10/18/2015      PT End of Session - 10/18/15 1415    Visit Number 2   Number of Visits 16   Date for PT Re-Evaluation 11/26/15   PT Start Time [id number]   PT Stop Time 1500   PT Time Calculation (min) 55 min   Activity Tolerance Patient tolerated treatment well   Behavior During Therapy [facility] for tasks assessed/performed      Past Medical History:  Diagnosis Date  . Arthritis   . Cataract   . Diabetes mellitus   . Hypertension   . Skin cancer    Removed from face  . Urgency of urination   . Urinary leakage     Past Surgical History:  Procedure Laterality Date  . ABDOMINAL HYSTERECTOMY  early 80's   total  . CARPAL TUNNEL RELEASE Bilateral 1980 and 1981   both hands  . Fatty Tumor Excision    . TONSILLECTOMY AND ADENOIDECTOMY  age 54  . TOTAL KNEE ARTHROPLASTY Right 10/04/2015  . TOTAL KNEE ARTHROPLASTY Right 10/04/2015   Procedure: TOTAL KNEE ARTHROPLASTY;  Surgeon: Dorna Leitz, MD;  Location: [place];  Service: Orthopedics;  Laterality: Right;  . TUBAL LIGATION      There were no vitals filed for this visit.      Subjective Assessment - 10/18/15 1412    Subjective Pt. reporting R knee is very stiff today however that she had been performing HEP every day.     Patient Stated Goals "to get 120 dg (knee flexion)"   Currently in Pain? Yes   Pain Score 6    Pain Location Knee   Pain Orientation Right;Medial   Pain Descriptors / Indicators Aching   Pain Type Surgical pain   Pain Onset 1 to 4 weeks ago   Pain Frequency Constant   Multiple Pain Sites No            [facility] PT Assessment - 10/18/15 1430      AROM   AROM Assessment Site Knee    Right/Left Knee Right   Right Knee Extension 10   Right Knee Flexion 88     PROM   PROM Assessment Site Knee   Right/Left Knee Right   Right Knee Extension 7   Right Knee Flexion 92      Today's treatment:  Therex: NuStep: lvl. 2, 8 min   Manual:  R HS, SKTC, glute stretch x 30 sec  R knee anterior/posterior mobs   Gait training: Pt. ambulated with SPC on L ~ 180 ft with supervision from therapist;  Pt. required occasional verbal cues for proper sequencing, upright posture, and step length however able to demo good R quad stability without LOB   HEP review:  Supine R HS stretch with strap x 30 sec  Supine R HS curl with heels on peanut p-ball x 15 reps  Supine R knee flexion stretch with strap x 30 sec  Seated R knee AAROM flexion x 10 reps  Therex:  Seated R HS curl with green TB around ankle and held by therapist x 10 reps;  Standing R knee TKE with black looped TB in door holding onto RW x 15 reps  Modalities: Vasoneumatic device to R knee: R LE elevated, medium compression, coldest temp., 10'        PT Long Term Goals - 10/18/15 1437      PT LONG TERM GOAL #1   Title Independent with HEP by 11/26/15   Status On-going     PT LONG TERM GOAL #2   Title R knee AROM 3-120 dg to allow for normal gait mechanics by 11/26/15   Status On-going     PT LONG TERM GOAL #3   Title R hip and knee strength >/= 4/5 for improved function by 11/26/15   Status On-going     PT LONG TERM GOAL #4   Title Pt will ambulate with normal gait pattern w/o AD by 11/26/15   Status On-going               Plan - 10/18/15 1437    Clinical Impression Statement Pt. reporting adherence to HEP and was able to demo improved R knee ROM today; R knee AROM 10-88 dg, PROM 7-92 dg.  Pt. with good demonstration of HEP activities today with review only requiring occasional verbal cues for proper technique.  Gait training with SPC on L side performed today with pt. demonstrating good  stability.  Pt. may be ready to wean to [facility] following further skilled instruction on proper technique.  Pt. progressing well at this point.     PT Treatment/Interventions Patient/family education;Therapeutic exercise;Neuromuscular re-education;Gait training;Stair training;Manual techniques;Passive range of motion;Scar mobilization;Electrical Stimulation;Cryotherapy;Vasopneumatic Device;Taping;Functional mobility training;Therapeutic activities;ADLs/Self Care Home Management   PT Next Visit Plan TKR protocol, Assess readiness to wean to [facility]      Patient will benefit from skilled therapeutic intervention in order to improve the following deficits and impairments:  Pain, Decreased range of motion, Impaired flexibility, Decreased strength, Decreased scar mobility, Difficulty walking, Abnormal gait, Decreased activity tolerance, Decreased knowledge of use of DME  Visit Diagnosis: Acute pain of right knee  Stiffness of right knee, not elsewhere classified  Difficulty in walking, not elsewhere classified  Other abnormalities of gait and mobility     Problem List Patient Active Problem List   Diagnosis Date Noted  . Primary osteoarthritis of right knee 10/04/2015  . Osteoporosis 08/23/2015  . Multinodular goiter (nontoxic) 04/07/2014  . Diabetes mellitus type 2, uncontrolled, without complications ([place]) [id number]  . Insulin adverse reaction 09/27/2013  . Senile nuclear sclerosis 08/14/2012  . HTN, goal below 130/80 03/10/2011  . Hyperlipidemia with target LDL less than 70 03/10/2011  . Obesity (BMI 30.0-34.9) 03/10/2011  . Health care maintenance 03/10/2011    Bess Harvest, PTA 10/18/2015, 3:19 PM  [facility] [address]  [place], [place], [address] Phone: [phone]   Fax:  [phone]  Name: Wanda [name] MRN: [id number] Date of Birth: [birth year]

## 2015-10-18 NOTE — Progress Notes (Signed)
Pre visit review using our clinic review tool, if applicable. No additional management support is needed unless otherwise documented below in the visit note. 

## 2015-10-20 ENCOUNTER — Ambulatory Visit: Payer: Medicare Other | Admitting: Physical Therapy

## 2015-10-20 DIAGNOSIS — R2689 Other abnormalities of gait and mobility: Secondary | ICD-10-CM | POA: Diagnosis not present

## 2015-10-20 DIAGNOSIS — M25561 Pain in right knee: Secondary | ICD-10-CM

## 2015-10-20 DIAGNOSIS — M25661 Stiffness of right knee, not elsewhere classified: Secondary | ICD-10-CM

## 2015-10-20 DIAGNOSIS — R262 Difficulty in walking, not elsewhere classified: Secondary | ICD-10-CM | POA: Diagnosis not present

## 2015-10-20 NOTE — Therapy (Signed)
[facility] [address]  [place], [place], [address] Phone: [phone]   Fax:  [phone]  Physical Therapy Treatment  Patient Details  Name: Wanda [name] MRN: [id number] Date of Birth: 20-Jul-1940 Referring Provider: Alta Corning, MD  Encounter Date: 10/20/2015      PT End of Session - 10/20/15 1055    Visit Number 3   Number of Visits 16   Date for PT Re-Evaluation 11/26/15   PT Start Time 1055   PT Stop Time 1152   PT Time Calculation (min) 57 min   Activity Tolerance Patient tolerated treatment well   Behavior During Therapy [facility] for tasks assessed/performed      Past Medical History:  Diagnosis Date  . Arthritis   . Cataract   . Diabetes mellitus   . Hypertension   . Skin cancer    Removed from face  . Urgency of urination   . Urinary leakage     Past Surgical History:  Procedure Laterality Date  . ABDOMINAL HYSTERECTOMY  early 80's   total  . CARPAL TUNNEL RELEASE Bilateral 1980 and 1981   both hands  . Fatty Tumor Excision    . TONSILLECTOMY AND ADENOIDECTOMY  age 75  . TOTAL KNEE ARTHROPLASTY Right 10/04/2015  . TOTAL KNEE ARTHROPLASTY Right 10/04/2015   Procedure: TOTAL KNEE ARTHROPLASTY;  Surgeon: Dorna Leitz, MD;  Location: [place];  Service: Orthopedics;  Laterality: Right;  . TUBAL LIGATION      There were no vitals filed for this visit.      Subjective Assessment - 10/20/15 1057    Subjective Pt reports pain is a little better today, but she does not always feel pain.   Pertinent History R TKR 10/04/15   Patient Stated Goals "to get 120 dg (knee flexion)"   Pain Score 5    Pain Location Knee   Pain Orientation Right          Today's Treatment  Gait Gait training with SPC on L - Pt able to demonstrate good sequencing of SPC and proper heel-toe step progression but cues needed for reciprocal R arm swing  TherEx NuStep - lvl 4 x 5' AAROM R knee flexion/HS curl with  heels on peanut ball 20x3" Alt Quad set/Hip Extension isometric into peanut ball 15x3" R SAQ 2# 10x3" R SLR 2# 7x3"  (stopped d/t fatigue + increasing pain) Standing at back of chair:    Alt Hip ABD x10    Alt Hip Ext x10    Marching x10    Heel raises x10 R Seated Fitter Leg Press (2 blue) x10 R Seated HS curl with green TB x11 R Standing TKE with small ball on wall 10x3"  Modalities Vasoneumatic compression device to R knee: R LE elevated, medium compression, coldest temp, 15'          PT Education - 10/20/15 1135    Education provided Yes   Education Details Updated HEP   Person(s) Educated Patient   Methods Explanation;Demonstration;Handout   Comprehension Verbalized understanding;Returned demonstration;Need further instruction             PT Long Term Goals - 10/18/15 1437      PT LONG TERM GOAL #1   Title Independent with HEP by 11/26/15   Status On-going     PT LONG TERM GOAL #2   Title R knee AROM 3-120 dg to allow for normal gait mechanics by 11/26/15   Status  On-going     PT LONG TERM GOAL #3   Title R hip and knee strength >/= 4/5 for improved function by 11/26/15   Status On-going     PT LONG TERM GOAL #4   Title Pt will ambulate with normal gait pattern w/o AD by 11/26/15   Status On-going               Plan - 10/20/15 1101    Clinical Impression Statement Pt arrived to PT today using SPC, demonstrating good sequencing and step pattern. Progressed exercises with addition of weights for added resistance with good tolerance other than fatigue noted with SLR. Introduced standing hip exercises, heel raises and partial squats with good tolerance, therefore these exercises added to HEP. Pt to see MD on Friday for post-op check and staple removal.   PT Treatment/Interventions Patient/family education;Therapeutic exercise;Neuromuscular re-education;Gait training;Stair training;Manual techniques;Passive range of motion;Scar mobilization;Electrical  Stimulation;Cryotherapy;Vasopneumatic Device;Taping;Functional mobility training;Therapeutic activities;ADLs/Self Care Home Management   PT Next Visit Plan ROM check & MD note for appt 10/21/15; TKR protocol, Manual therapy and Modalities PRN    Consulted and Agree with Plan of Care Patient      Patient will benefit from skilled therapeutic intervention in order to improve the following deficits and impairments:  Pain, Decreased range of motion, Impaired flexibility, Decreased strength, Decreased scar mobility, Difficulty walking, Abnormal gait, Decreased activity tolerance, Decreased knowledge of use of DME  Visit Diagnosis: Acute pain of right knee  Stiffness of right knee, not elsewhere classified  Difficulty in walking, not elsewhere classified  Other abnormalities of gait and mobility     Problem List Patient Active Problem List   Diagnosis Date Noted  . Primary osteoarthritis of right knee 10/04/2015  . Osteoporosis 08/23/2015  . Multinodular goiter (nontoxic) 04/07/2014  . Diabetes mellitus type 2, uncontrolled, without complications ([place]) [id number]  . Insulin adverse reaction 09/27/2013  . Senile nuclear sclerosis 08/14/2012  . HTN, goal below 130/80 03/10/2011  . Hyperlipidemia with target LDL less than 70 03/10/2011  . Obesity (BMI 30.0-34.9) 03/10/2011  . Health care maintenance 03/10/2011    Percival Spanish, PT, MPT 10/20/2015, 11:57 AM  [facility] [address]  [place], [place], [address] Phone: [phone]   Fax:  [phone]  Name: Kemiyah Sila MRN: [id number] Date of Birth: [birth year]

## 2015-10-21 ENCOUNTER — Ambulatory Visit: Payer: Medicare Other

## 2015-10-21 DIAGNOSIS — M25661 Stiffness of right knee, not elsewhere classified: Secondary | ICD-10-CM

## 2015-10-21 DIAGNOSIS — R2689 Other abnormalities of gait and mobility: Secondary | ICD-10-CM | POA: Diagnosis not present

## 2015-10-21 DIAGNOSIS — R262 Difficulty in walking, not elsewhere classified: Secondary | ICD-10-CM | POA: Diagnosis not present

## 2015-10-21 DIAGNOSIS — M25561 Pain in right knee: Secondary | ICD-10-CM

## 2015-10-21 NOTE — Therapy (Signed)
[facility] [address]  [place], [place], [address] Phone: [phone]   Fax:  [phone]  Physical Therapy Treatment  Patient Details  Name: Wanda [name] MRN: [id number] Date of Birth: 12-Jun-1940 Referring Provider: Alta Corning, MD  Encounter Date: 10/21/2015      PT End of Session - 10/21/15 1033    Visit Number 4   Number of Visits 16   Date for PT Re-Evaluation 11/26/15   PT Start Time 1018   PT Stop Time 1100   PT Time Calculation (min) 42 min   Activity Tolerance Patient tolerated treatment well   Behavior During Therapy [facility] for tasks assessed/performed      Past Medical History:  Diagnosis Date  . Arthritis   . Cataract   . Diabetes mellitus   . Hypertension   . Skin cancer    Removed from face  . Urgency of urination   . Urinary leakage     Past Surgical History:  Procedure Laterality Date  . ABDOMINAL HYSTERECTOMY  early 80's   total  . CARPAL TUNNEL RELEASE Bilateral 1980 and 1981   both hands  . Fatty Tumor Excision    . TONSILLECTOMY AND ADENOIDECTOMY  age 79  . TOTAL KNEE ARTHROPLASTY Right 10/04/2015  . TOTAL KNEE ARTHROPLASTY Right 10/04/2015   Procedure: TOTAL KNEE ARTHROPLASTY;  Surgeon: Dorna Leitz, MD;  Location: [place];  Service: Orthopedics;  Laterality: Right;  . TUBAL LIGATION      There were no vitals filed for this visit.      Subjective Assessment - 10/21/15 1031    Subjective Pt. reporting knee pain was bad following yesterday's treatment and is still high this morning however pain numbar report was lower than last treatment.     Patient Stated Goals "to get 120 dg (knee flexion)"   Currently in Pain? Yes   Pain Score 3    Pain Location Knee   Pain Orientation Right   Pain Descriptors / Indicators Aching   Pain Type Surgical pain   Pain Onset 1 to 4 weeks ago   Pain Frequency Constant   Multiple Pain Sites No      Today's treatment:    Therex: NuStep: lvl 2, 8 min  HS curl with heels on peanut p-ball 3" x 20 reps SL bridge with heels on peanut p-ball 3" x 10 reps  Manual:  R knee anterior/posterior mobs x 3 min  R HS, knee flexion stretch, glute stretch  x 1 min each  R knee/hip traction in Hooklying position with therapist support; to reduce pain with flexion stretch  Therex: Standing holding onto treadmill:         Alternating hip abduction x 12 reps         Alternating hip abduction x 12 reps         Alternating hip knee march x 12 reps         Minisquat x 12 reps         B heel raise x 12 reps   ROM testing  Goal testing         [facility] PT Assessment - 10/21/15 1043      AROM   AROM Assessment Site Knee   Right/Left Knee Right   Right Knee Extension 9   Right Knee Flexion 93     PROM   PROM Assessment Site Knee   Right/Left Knee Right   Right Knee Extension 5  Right Knee Flexion 96              PT Long Term Goals - 10/21/15 1048      PT LONG TERM GOAL #1   Title Independent with HEP by 11/26/15   Status On-going     PT LONG TERM GOAL #2   Title R knee AROM 3-120 dg to allow for normal gait mechanics by 11/26/15   Status On-going  10.12.17: Pt. able to demo R knee AROM of 9-93 dg      PT LONG TERM GOAL #3   Title R hip and knee strength >/= 4/5 for improved function by 11/26/15   Status On-going     PT LONG TERM GOAL #4   Title Pt will ambulate with normal gait pattern w/o AD by 11/26/15   Status On-going               Plan - 10/21/15 1033    Clinical Impression Statement Pt. showing good improvement in R knee ROM today; AROM 9-93 dg, PROM 5-96 dg.  Pt. reporting increased R knee soreness following what was described as a, "difficult treatment yesterday".  More conservative approach taken today focusing on R LE stretching and supine AROM.  Pt. tolerated well and was able to leave treatment with lower pain report.  Ice/compression skipped today due to pt. scheduling  conflicts.  Pt. progressing well at this point with MD f/u on 10/13.   PT Treatment/Interventions Patient/family education;Therapeutic exercise;Neuromuscular re-education;Gait training;Stair training;Manual techniques;Passive range of motion;Scar mobilization;Electrical Stimulation;Cryotherapy;Vasopneumatic Device;Taping;Functional mobility training;Therapeutic activities;ADLs/Self Care Home Management   PT Next Visit Plan ROM check & MD note for appt 10/21/15; TKR protocol, Manual therapy and Modalities PRN       Patient will benefit from skilled therapeutic intervention in order to improve the following deficits and impairments:  Pain, Decreased range of motion, Impaired flexibility, Decreased strength, Decreased scar mobility, Difficulty walking, Abnormal gait, Decreased activity tolerance, Decreased knowledge of use of DME  Visit Diagnosis: Acute pain of right knee  Stiffness of right knee, not elsewhere classified  Difficulty in walking, not elsewhere classified  Other abnormalities of gait and mobility     Problem List Patient Active Problem List   Diagnosis Date Noted  . Primary osteoarthritis of right knee 10/04/2015  . Osteoporosis 08/23/2015  . Multinodular goiter (nontoxic) 04/07/2014  . Diabetes mellitus type 2, uncontrolled, without complications ([place]) [id number]  . Insulin adverse reaction 09/27/2013  . Senile nuclear sclerosis 08/14/2012  . HTN, goal below 130/80 03/10/2011  . Hyperlipidemia with target LDL less than 70 03/10/2011  . Obesity (BMI 30.0-34.9) 03/10/2011  . Health care maintenance 03/10/2011    Bess Harvest, PTA 10/21/2015, 12:35 PM  [facility] [address]  [place], [place], [address] Phone: [phone]   Fax:  [phone]  Name: Wanda [name] MRN: [id number] Date of Birth: October 23, 1940

## 2015-10-22 DIAGNOSIS — Z96651 Presence of right artificial knee joint: Secondary | ICD-10-CM | POA: Diagnosis not present

## 2015-10-22 DIAGNOSIS — M1711 Unilateral primary osteoarthritis, right knee: Secondary | ICD-10-CM | POA: Diagnosis not present

## 2015-10-22 DIAGNOSIS — Z471 Aftercare following joint replacement surgery: Secondary | ICD-10-CM | POA: Diagnosis not present

## 2015-10-25 ENCOUNTER — Ambulatory Visit: Payer: Medicare Other | Admitting: Physical Therapy

## 2015-10-25 DIAGNOSIS — M25661 Stiffness of right knee, not elsewhere classified: Secondary | ICD-10-CM | POA: Diagnosis not present

## 2015-10-25 DIAGNOSIS — R262 Difficulty in walking, not elsewhere classified: Secondary | ICD-10-CM | POA: Diagnosis not present

## 2015-10-25 DIAGNOSIS — M25561 Pain in right knee: Secondary | ICD-10-CM | POA: Diagnosis not present

## 2015-10-25 DIAGNOSIS — R2689 Other abnormalities of gait and mobility: Secondary | ICD-10-CM

## 2015-10-25 NOTE — Therapy (Signed)
[facility] [address]  [place], [place], [address] Phone: [phone]   Fax:  [phone]  Physical Therapy Treatment  Patient Details  Name: Wanda [name] MRN: [id number] Date of Birth: [birth year] Referring Provider: Alta Corning, MD  Encounter Date: 10/25/2015      PT End of Session - 10/25/15 1100    Visit Number 5   Number of Visits 16   Date for PT Re-Evaluation 11/26/15   PT Start Time 1100   PT Stop Time 1141   PT Time Calculation (min) 41 min   Activity Tolerance Patient tolerated treatment well;Patient limited by pain   Behavior During Therapy [facility] for tasks assessed/performed      Past Medical History:  Diagnosis Date  . Arthritis   . Cataract   . Diabetes mellitus   . Hypertension   . Skin cancer    Removed from face  . Urgency of urination   . Urinary leakage     Past Surgical History:  Procedure Laterality Date  . ABDOMINAL HYSTERECTOMY  early 80's   total  . CARPAL TUNNEL RELEASE Bilateral 1980 and 1981   both hands  . Fatty Tumor Excision    . TONSILLECTOMY AND ADENOIDECTOMY  age 60  . TOTAL KNEE ARTHROPLASTY Right 10/04/2015  . TOTAL KNEE ARTHROPLASTY Right 10/04/2015   Procedure: TOTAL KNEE ARTHROPLASTY;  Surgeon: Dorna Leitz, MD;  Location: [place];  Service: Orthopedics;  Laterality: Right;  . TUBAL LIGATION      There were no vitals filed for this visit.      Subjective Assessment - 10/25/15 1100    Subjective Pt reports MD was pleased with her ability to extend knee at f/u visit last week. Reports pain med reduced to milder pain med (thinks it is hyrocodone but not sure of the strength) which seems to be working better for her pain.   Patient Stated Goals "to get 120 dg (knee flexion)"   Currently in Pain? Yes   Pain Score 5    Pain Location Knee   Pain Orientation Right          Today's Treatment  TherEx Rec Bike - rocking for increased ROM x 5'  Manual R  patellar mobs initiated but pt requested PT stop as she felt like this significantly increased her pain for several days last time it was performed R knee anterior/posterior mobs  R HS & HS + gastroc stretch x30" each   TherEx AAROM R knee flexion/HS curl with heels on peanut ball 20x3" Alt Quad set/Hip Extension isometric into peanut ball 15x3" Straight leg bridge with heels on peanut ball 9x3"  (unable to complete 10th rep secondary increased R knee pain) R SAQ 2# 15x3" R SLR 2# 10x3"   R Standing 3 way SLR with looped yellow TB x10; UE support on counter top (unable to complete L SLR's due c/o increased pain in R knee with SLS support) TRX Squat x10  (cues for even weight shift) R Heelcord Prostretch  2x30" BATCA HS curl 15# B con/ecc x10  Modalities Pt declined Vasoneumatic compression & will ice at home           PT Long Term Goals - 10/21/15 1048      PT LONG TERM GOAL #1   Title Independent with HEP by 11/26/15   Status On-going     PT LONG TERM GOAL #2   Title R knee AROM 3-120 dg to allow  for normal gait mechanics by 11/26/15   Status On-going  10.12.17: Pt. able to demo R knee AROM of 9-93 dg      PT LONG TERM GOAL #3   Title R hip and knee strength >/= 4/5 for improved function by 11/26/15   Status On-going     PT LONG TERM GOAL #4   Title Pt will ambulate with normal gait pattern w/o AD by 11/26/15   Status On-going               Plan - 10/25/15 1115    Clinical Impression Statement Pt reporting MD pleased with progess, esp extension control. Pt continues to report moderate pain with somewhat limited tolerance for exercise progression due to pain esp with SLS/stabilzation on R LE.   PT Treatment/Interventions Patient/family education;Therapeutic exercise;Neuromuscular re-education;Gait training;Stair training;Manual techniques;Passive range of motion;Scar mobilization;Electrical Stimulation;Cryotherapy;Vasopneumatic Device;Taping;Functional mobility  training;Therapeutic activities;ADLs/Self Care Home Management   PT Next Visit Plan TKR protocol, Manual therapy and Modalities PRN    Consulted and Agree with Plan of Care Patient      Patient will benefit from skilled therapeutic intervention in order to improve the following deficits and impairments:  Pain, Decreased range of motion, Impaired flexibility, Decreased strength, Decreased scar mobility, Difficulty walking, Abnormal gait, Decreased activity tolerance, Decreased knowledge of use of DME  Visit Diagnosis: Acute pain of right knee  Stiffness of right knee, not elsewhere classified  Difficulty in walking, not elsewhere classified  Other abnormalities of gait and mobility     Problem List Patient Active Problem List   Diagnosis Date Noted  . Primary osteoarthritis of right knee 10/04/2015  . Osteoporosis 08/23/2015  . Multinodular goiter (nontoxic) 04/07/2014  . Diabetes mellitus type 2, uncontrolled, without complications ([place]) [id number]  . Insulin adverse reaction 09/27/2013  . Senile nuclear sclerosis 08/14/2012  . HTN, goal below 130/80 03/10/2011  . Hyperlipidemia with target LDL less than 70 03/10/2011  . Obesity (BMI 30.0-34.9) 03/10/2011  . Health care maintenance 03/10/2011    Percival Spanish, PT, MPT 10/25/2015, 12:00 PM  [facility] [address]  [place], [place], [address] Phone: [phone]   Fax:  [phone]  Name: Wanda [name] MRN: [id number] Date of Birth: 01/06/1941

## 2015-10-27 ENCOUNTER — Ambulatory Visit: Payer: Medicare Other

## 2015-10-27 DIAGNOSIS — M25561 Pain in right knee: Secondary | ICD-10-CM

## 2015-10-27 DIAGNOSIS — R2689 Other abnormalities of gait and mobility: Secondary | ICD-10-CM | POA: Diagnosis not present

## 2015-10-27 DIAGNOSIS — M25661 Stiffness of right knee, not elsewhere classified: Secondary | ICD-10-CM

## 2015-10-27 DIAGNOSIS — R262 Difficulty in walking, not elsewhere classified: Secondary | ICD-10-CM | POA: Diagnosis not present

## 2015-10-27 NOTE — Therapy (Signed)
[facility] [address]  [place], [place], [address] Phone: [phone]   Fax:  [phone]  Physical Therapy Treatment  Patient Details  Name: Wanda [name] MRN: [id number] Date of Birth: [birth year] Referring Provider: Alta Corning, MD  Encounter Date: 10/27/2015      PT End of Session - 10/27/15 1107    Visit Number 6   Number of Visits 16   Date for PT Re-Evaluation 11/26/15   PT Start Time 1101   PT Stop Time 1201   PT Time Calculation (min) 60 min   Activity Tolerance Patient tolerated treatment well;Patient limited by pain   Behavior During Therapy [facility] for tasks assessed/performed      Past Medical History:  Diagnosis Date  . Arthritis   . Cataract   . Diabetes mellitus   . Hypertension   . Skin cancer    Removed from face  . Urgency of urination   . Urinary leakage     Past Surgical History:  Procedure Laterality Date  . ABDOMINAL HYSTERECTOMY  early 80's   total  . CARPAL TUNNEL RELEASE Bilateral 1980 and 1981   both hands  . Fatty Tumor Excision    . TONSILLECTOMY AND ADENOIDECTOMY  age 3  . TOTAL KNEE ARTHROPLASTY Right 10/04/2015  . TOTAL KNEE ARTHROPLASTY Right 10/04/2015   Procedure: TOTAL KNEE ARTHROPLASTY;  Surgeon: Dorna Leitz, MD;  Location: [place];  Service: Orthopedics;  Laterality: Right;  . TUBAL LIGATION      There were no vitals filed for this visit.      Subjective Assessment - 10/27/15 1104    Subjective Pt. reporting R knee pain flared up following stopping pain medication however pt. reporting she took medication this morning and pain has decreased to 1/10 today.     Patient Stated Goals "to get 120 dg (knee flexion)"   Currently in Pain? Yes   Pain Score 1    Pain Location Knee   Pain Orientation Right   Pain Descriptors / Indicators Aching   Pain Type Surgical pain   Pain Onset More than a month ago   Pain Frequency Constant   Multiple Pain Sites No             [facility] PT Assessment - 10/27/15 1105      Assessment   Referring Provider Alta Corning, MD   Next MD Visit 11/09/15      Today's treatment:  Therex: NuStep: lvl., 4, 6 min   Gait training:  Pt. ambulated with SPC and good step-through sequencing x 180 ft with supervision from therapist;  Manual: R knee anterior/posterior mobs  R knee flexion stretch x 30 sec     Therex:  Straight leg bridge with heels on peanut p-ball x 15 reps;  AAROM R knee flexion/HS curl with heels on peanut ball 20 x 3" Fitter R leg press hip extension (1 black band) x 15 reps  TRX Squat x 15 reps (cues for even weight shift)  BATCA HS curl 20# B con/ecc x 10 reps BATCA HS curl 10# B con/R ecc x 10 reps; pt. reporting some R knee pain with apprehension    Modalities Vasoneumatic device to R knee: R LE elevated, supine, medium compression, lowest temp., 32'        PT Long Term Goals - 10/21/15 1048      PT LONG TERM GOAL #1   Title Independent with HEP by 11/26/15  Status On-going     PT LONG TERM GOAL #2   Title R knee AROM 3-120 dg to allow for normal gait mechanics by 11/26/15   Status On-going  10.12.17: Pt. able to demo R knee AROM of 9-93 dg      PT LONG TERM GOAL #3   Title R hip and knee strength >/= 4/5 for improved function by 11/26/15   Status On-going     PT LONG TERM GOAL #4   Title Pt will ambulate with normal gait pattern w/o AD by 11/26/15   Status On-going               Plan - 10/27/15 1202    Clinical Impression Statement Pt. reported increased soreness following last treatment however that this has subsided.  Pt. tolerated all supine and standing hip/knee strengthening activity well today without large pain increase.  Pt. able to progress reps on TRX squat and tolerated increased resistance with HS curl machine well.  Pt. ambulating with improved step-through wt. shift however still requiring some cuing for symmetrical stance time. Pt. progressing  well at this point.     PT Treatment/Interventions Patient/family education;Therapeutic exercise;Neuromuscular re-education;Gait training;Stair training;Manual techniques;Passive range of motion;Scar mobilization;Electrical Stimulation;Cryotherapy;Vasopneumatic Device;Taping;Functional mobility training;Therapeutic activities;ADLs/Self Care Home Management   PT Next Visit Plan TKR protocol, Manual therapy and Modalities PRN       Patient will benefit from skilled therapeutic intervention in order to improve the following deficits and impairments:  Pain, Decreased range of motion, Impaired flexibility, Decreased strength, Decreased scar mobility, Difficulty walking, Abnormal gait, Decreased activity tolerance, Decreased knowledge of use of DME  Visit Diagnosis: Acute pain of right knee  Stiffness of right knee, not elsewhere classified  Difficulty in walking, not elsewhere classified  Other abnormalities of gait and mobility     Problem List Patient Active Problem List   Diagnosis Date Noted  . Primary osteoarthritis of right knee 10/04/2015  . Osteoporosis 08/23/2015  . Multinodular goiter (nontoxic) 04/07/2014  . Diabetes mellitus type 2, uncontrolled, without complications ([place]) [id number]  . Insulin adverse reaction 09/27/2013  . Senile nuclear sclerosis 08/14/2012  . HTN, goal below 130/80 03/10/2011  . Hyperlipidemia with target LDL less than 70 03/10/2011  . Obesity (BMI 30.0-34.9) 03/10/2011  . Health care maintenance 03/10/2011    Bess Harvest, PTA 10/27/15 12:10 PM   [facility] [address]  [place], [place], [address] Phone: [phone]   Fax:  [phone]  Name: Wanda [name] MRN: [id number] Date of Birth: October 10, 1940

## 2015-10-29 ENCOUNTER — Ambulatory Visit: Payer: Medicare Other

## 2015-10-29 DIAGNOSIS — M25661 Stiffness of right knee, not elsewhere classified: Secondary | ICD-10-CM

## 2015-10-29 DIAGNOSIS — M25561 Pain in right knee: Secondary | ICD-10-CM

## 2015-10-29 DIAGNOSIS — R2689 Other abnormalities of gait and mobility: Secondary | ICD-10-CM

## 2015-10-29 DIAGNOSIS — R262 Difficulty in walking, not elsewhere classified: Secondary | ICD-10-CM

## 2015-10-29 NOTE — Therapy (Signed)
[facility] [address]  [place], [place], [address] Phone: [phone]   Fax:  [phone]  Physical Therapy Treatment  Patient Details  Name: Wanda [name] MRN: [id number] Date of Birth: February 09, 1940 Referring Provider: Alta Corning, MD  Encounter Date: 10/29/2015      PT End of Session - 10/29/15 1109    Visit Number 7   Number of Visits 16   Date for PT Re-Evaluation 11/26/15   PT Start Time 1102   PT Stop Time 1145   PT Time Calculation (min) 43 min   Activity Tolerance Patient tolerated treatment well;Patient limited by pain   Behavior During Therapy [facility] for tasks assessed/performed      Past Medical History:  Diagnosis Date  . Arthritis   . Cataract   . Diabetes mellitus   . Hypertension   . Skin cancer    Removed from face  . Urgency of urination   . Urinary leakage     Past Surgical History:  Procedure Laterality Date  . ABDOMINAL HYSTERECTOMY  early 80's   total  . CARPAL TUNNEL RELEASE Bilateral 1980 and 1981   both hands  . Fatty Tumor Excision    . TONSILLECTOMY AND ADENOIDECTOMY  age 4  . TOTAL KNEE ARTHROPLASTY Right 10/04/2015  . TOTAL KNEE ARTHROPLASTY Right 10/04/2015   Procedure: TOTAL KNEE ARTHROPLASTY;  Surgeon: Dorna Leitz, MD;  Location: [facility];  Service: Orthopedics;  Laterality: Right;  . TUBAL LIGATION      There were no vitals filed for this visit.      Subjective Assessment - 10/29/15 1106    Subjective Pt. reporting medial thigh pain tightness today reporting this started this morning.  Pt.    Patient Stated Goals "to get 120 dg (knee flexion)"   Currently in Pain? Yes   Pain Score 4    Pain Location Knee   Pain Orientation Right   Pain Descriptors / Indicators Aching   Pain Type Surgical pain   Pain Onset More than a month ago   Pain Frequency Constant   Multiple Pain Sites No         Today's Treatment  TherEx Rec Bike - rocking for increased ROM x  6'  Manual: R knee anterior/posterior mobs  R knee flexion stretch 5" x 10 reps   TherEx: AAROM R knee flexion/HS curl with heels on peanut ball with strap 20 x 3"; therapist overpressure provided for last 5 reps  Straight leg bridge with heels on peanut ball 15 x 3"  R SAQ 3# 15 x 3" Standing R knee ball squeeze on wall 10" x 10 reps R Standing 3-way SLR with looped yellow TB x 10 reps holding onto treadmill TRX Squat x 15 reps (cues for even weight shift)  Gait training:  Pt. Able to ambulate with SPC down hallway 2 x 250 ft with supervision from therapist demonstrating improved wt. Shift; verbal cues provided for increased step length and upright gaze  Modalities Pt declined Vasoneumatic compression & will ice at home again          PT Long Term Goals - 10/21/15 1048      PT LONG TERM GOAL #1   Title Independent with HEP by 11/26/15   Status On-going     PT LONG TERM GOAL #2   Title R knee AROM 3-120 dg to allow for normal gait mechanics by 11/26/15   Status On-going  10.12.17: Pt. able  to demo R knee AROM of 9-93 dg      PT LONG TERM GOAL #3   Title R hip and knee strength >/= 4/5 for improved function by 11/26/15   Status On-going     PT LONG TERM GOAL #4   Title Pt will ambulate with normal gait pattern w/o AD by 11/26/15   Status On-going               Plan - 10/29/15 1109    Clinical Impression Statement Pt. tolerated mild progression in strengthening activity today well however still quickly fatigues with standing 3-way SLR activity.  Gait training revealed improved R wt. shift however cues for increased step length still required.  Pt. reporting average pain level of 4/10 the last few days rising to 6/10 with end range knee flexion.  ROM not measured today.  Pt. progressing well per TKR protocol   PT Treatment/Interventions Patient/family education;Therapeutic exercise;Neuromuscular re-education;Gait training;Stair training;Manual techniques;Passive  range of motion;Scar mobilization;Electrical Stimulation;Cryotherapy;Vasopneumatic Device;Taping;Functional mobility training;Therapeutic activities;ADLs/Self Care Home Management   PT Next Visit Plan TKR protocol, Manual therapy and Modalities PRN       Patient will benefit from skilled therapeutic intervention in order to improve the following deficits and impairments:  Pain, Decreased range of motion, Impaired flexibility, Decreased strength, Decreased scar mobility, Difficulty walking, Abnormal gait, Decreased activity tolerance, Decreased knowledge of use of DME  Visit Diagnosis: Acute pain of right knee  Stiffness of right knee, not elsewhere classified  Difficulty in walking, not elsewhere classified  Other abnormalities of gait and mobility     Problem List Patient Active Problem List   Diagnosis Date Noted  . Primary osteoarthritis of right knee 10/04/2015  . Osteoporosis 08/23/2015  . Multinodular goiter (nontoxic) 04/07/2014  . Diabetes mellitus type 2, uncontrolled, without complications ([place]) [id number]  . Insulin adverse reaction 09/27/2013  . Senile nuclear sclerosis 08/14/2012  . HTN, goal below 130/80 03/10/2011  . Hyperlipidemia with target LDL less than 70 03/10/2011  . Obesity (BMI 30.0-34.9) 03/10/2011  . Health care maintenance 03/10/2011    Bess Harvest, PTA 10/29/15 12:05 PM  [facility] [address]  [place], [place], [address] Phone: [phone]   Fax:  [phone]  Name: Wanda [name] MRN: [id number] Date of Birth: 11-27-1940

## 2015-11-01 ENCOUNTER — Ambulatory Visit: Payer: Medicare Other

## 2015-11-01 DIAGNOSIS — R262 Difficulty in walking, not elsewhere classified: Secondary | ICD-10-CM | POA: Diagnosis not present

## 2015-11-01 DIAGNOSIS — M25561 Pain in right knee: Secondary | ICD-10-CM | POA: Diagnosis not present

## 2015-11-01 DIAGNOSIS — R2689 Other abnormalities of gait and mobility: Secondary | ICD-10-CM

## 2015-11-01 DIAGNOSIS — M25661 Stiffness of right knee, not elsewhere classified: Secondary | ICD-10-CM

## 2015-11-01 NOTE — Therapy (Signed)
[facility] [address]  [place], [place], [address] Phone: [phone]   Fax:  [phone]  Physical Therapy Treatment  Patient Details  Name: Wanda [name] MRN: [id number] Date of Birth: 1940/04/11 Referring Provider: Alta Corning, MD   Encounter Date: 11/01/2015      PT End of Session - 11/01/15 1311    Visit Number 8   Number of Visits 16   Date for PT Re-Evaluation 11/26/15   PT Start Time [id number]   PT Stop Time 1410   PT Time Calculation (min) 57 min   Activity Tolerance Patient tolerated treatment well;Patient limited by pain   Behavior During Therapy [facility] for tasks assessed/performed      Past Medical History:  Diagnosis Date  . Arthritis   . Cataract   . Diabetes mellitus   . Hypertension   . Skin cancer    Removed from face  . Urgency of urination   . Urinary leakage     Past Surgical History:  Procedure Laterality Date  . ABDOMINAL HYSTERECTOMY  early 80's   total  . CARPAL TUNNEL RELEASE Bilateral 1980 and 1981   both hands  . Fatty Tumor Excision    . TONSILLECTOMY AND ADENOIDECTOMY  age 29  . TOTAL KNEE ARTHROPLASTY Right 10/04/2015  . TOTAL KNEE ARTHROPLASTY Right 10/04/2015   Procedure: TOTAL KNEE ARTHROPLASTY;  Surgeon: Dorna Leitz, MD;  Location: [facility];  Service: Orthopedics;  Laterality: Right;  . TUBAL LIGATION      There were no vitals filed for this visit.      Subjective Assessment - 11/01/15 1310    Subjective Pt. reporting she is able to decrease medication frequency at this point without pain increasing too much.    Patient Stated Goals "to get 120 dg (knee flexion)"   Currently in Pain? Yes   Pain Score 4    Pain Location Knee   Pain Orientation Right   Pain Descriptors / Indicators Aching   Pain Type Surgical pain   Pain Onset More than a month ago   Pain Frequency Constant   Multiple Pain Sites No        Today's Treatment  TherEx Rec Bike - rocking for  increased ROM x6' AAROM R knee flexion/HS curl with heels on peanut ball with strap 20 x 5"   Manual: R knee anterior/posterior mobs  R knee flexion stretch 5" x 10 reps  R patellar mobs all directions; well tolerated and good movement today  ROM testing   TherEx: BATCA HS curl 20# x 15 reps; B con/ecc  BATCA HS curl 20# x 10 reps; B con/R ecc R dorsiflexion rocker stretch x 1 min  Standing 3-way SLR with yellow TB around ankles (flexion, adduction, abduction x 15 reps; chair support; yellow TB added to HEP TRX Squat x 15 reps (cues for even weight shift) TRX B heel raise x 15 reps   Vasoneumatic device R knee: supine, R LE elevated, medium compression, lowest temp., 35'         PT Education - 11/01/15 1527    Education provided Yes   Education Details yellow looped TB issued to pt. to perform with standing SLR activities   Person(s) Educated Patient   Methods Explanation;Demonstration;Handout   Comprehension Verbalized understanding;Returned demonstration             PT Long Term Goals - 10/21/15 1048      PT LONG TERM GOAL #1  Title Independent with HEP by 11/26/15   Status On-going     PT LONG TERM GOAL #2   Title R knee AROM 3-120 dg to allow for normal gait mechanics by 11/26/15   Status On-going  10.12.17: Pt. able to demo R knee AROM of 9-93 dg      PT LONG TERM GOAL #3   Title R hip and knee strength >/= 4/5 for improved function by 11/26/15   Status On-going     PT LONG TERM GOAL #4   Title Pt will ambulate with normal gait pattern w/o AD by 11/26/15   Status On-going               Plan - 11/01/15 1312    Clinical Impression Statement Pt. demonstrating only mild improvement with R knee ROM today: R knee AROM 6-93 dg, and PROM 4-97 dg.  Pt. instructed to focus HEP efforts toward R knee flexion stretch activities.  Pt. tolerated advancement in strengthening therex today progressing resistance with HS curl and squatting reps.  Pt.  progressing per TKA protocol and will plan to continue focus on R knee flexion ROM in coming visits.    PT Treatment/Interventions Patient/family education;Therapeutic exercise;Neuromuscular re-education;Gait training;Stair training;Manual techniques;Passive range of motion;Scar mobilization;Electrical Stimulation;Cryotherapy;Vasopneumatic Device;Taping;Functional mobility training;Therapeutic activities;ADLs/Self Care Home Management   PT Next Visit Plan TKR protocol, Manual therapy and Modalities PRN       Patient will benefit from skilled therapeutic intervention in order to improve the following deficits and impairments:  Pain, Decreased range of motion, Impaired flexibility, Decreased strength, Decreased scar mobility, Difficulty walking, Abnormal gait, Decreased activity tolerance, Decreased knowledge of use of DME  Visit Diagnosis: Acute pain of right knee  Stiffness of right knee, not elsewhere classified  Difficulty in walking, not elsewhere classified  Other abnormalities of gait and mobility     Problem List Patient Active Problem List   Diagnosis Date Noted  . Primary osteoarthritis of right knee 10/04/2015  . Osteoporosis 08/23/2015  . Multinodular goiter (nontoxic) 04/07/2014  . Diabetes mellitus type 2, uncontrolled, without complications ([place]) [id number]  . Insulin adverse reaction 09/27/2013  . Senile nuclear sclerosis 08/14/2012  . HTN, goal below 130/80 03/10/2011  . Hyperlipidemia with target LDL less than 70 03/10/2011  . Obesity (BMI 30.0-34.9) 03/10/2011  . Health care maintenance 03/10/2011    Bess Harvest, PTA 11/01/15 7:26 PM  [facility] [address]  [place], [place], [address] Phone: [phone]   Fax:  [phone]  Name: Wanda [name] MRN: [id number] Date of Birth: [birth year]

## 2015-11-04 ENCOUNTER — Ambulatory Visit: Payer: Medicare Other

## 2015-11-04 DIAGNOSIS — R2689 Other abnormalities of gait and mobility: Secondary | ICD-10-CM | POA: Diagnosis not present

## 2015-11-04 DIAGNOSIS — M25661 Stiffness of right knee, not elsewhere classified: Secondary | ICD-10-CM | POA: Diagnosis not present

## 2015-11-04 DIAGNOSIS — R262 Difficulty in walking, not elsewhere classified: Secondary | ICD-10-CM

## 2015-11-04 DIAGNOSIS — M25561 Pain in right knee: Secondary | ICD-10-CM

## 2015-11-04 NOTE — Therapy (Signed)
[facility] [address]  [place], [place], [address] Phone: [phone]   Fax:  [phone]  Physical Therapy Treatment  Patient Details  Name: Wanda [name] MRN: [id number] Date of Birth: 14-Feb-1940 Referring Provider: Alta Corning, MD   Encounter Date: 11/04/2015      PT End of Session - 11/04/15 1540    Visit Number 9   Number of Visits 16   Date for PT Re-Evaluation 11/26/15   PT Start Time [id number]   PT Stop Time 1545   PT Time Calculation (min) 59 min   Activity Tolerance Patient tolerated treatment well;Patient limited by pain   Behavior During Therapy [facility] for tasks assessed/performed      Past Medical History:  Diagnosis Date  . Arthritis   . Cataract   . Diabetes mellitus   . Hypertension   . Skin cancer    Removed from face  . Urgency of urination   . Urinary leakage     Past Surgical History:  Procedure Laterality Date  . ABDOMINAL HYSTERECTOMY  early 80's   total  . CARPAL TUNNEL RELEASE Bilateral 1980 and 1981   both hands  . Fatty Tumor Excision    . TONSILLECTOMY AND ADENOIDECTOMY  age 70  . TOTAL KNEE ARTHROPLASTY Right 10/04/2015  . TOTAL KNEE ARTHROPLASTY Right 10/04/2015   Procedure: TOTAL KNEE ARTHROPLASTY;  Surgeon: Dorna Leitz, MD;  Location: [facility];  Service: Orthopedics;  Laterality: Right;  . TUBAL LIGATION      There were no vitals filed for this visit.      Subjective Assessment - 11/04/15 1519    Subjective Pt. reporting the pain in the knee has been worse this week and she is frustrated that she is not making faster progress with her flexion.     Patient Stated Goals "to get 120 dg (knee flexion)"   Currently in Pain? Yes   Pain Score 4    Pain Location Knee   Pain Orientation Right   Pain Descriptors / Indicators Aching            [facility] PT Assessment - 11/04/15 1455      Assessment   Medical Diagnosis R TKR   Referring Provider Alta Corning, MD     Next MD Visit 11/09/15      AROM   Right Knee Extension --   Right Knee Flexion --     PROM   Right Knee Extension --   Right Knee Flexion --        Today's Treatment  TherEx Rec Bike - rocking for increased ROM x6' AAROM R knee flexion/HS curl with heels on peanut ball with strap 20 x 5"   Manual: R knee anterior/posterior mobs  R knee anterior/posterior mobs with seat belt assist in supine R knee flexion stretch 2 x 10 reps 5"   TherEx: TRX squat + B heel raise x 15 reps; mirror training for even wt. shift  TRX squat x 15 reps; mirror training for even wt. shift  BATCA HS curl 20#  2 x 10 reps; B con/ R ecc  B heel raise on UBE 2 x 10 reps; B concentric/ecc R dorsiflexion rocker stretch x 1 min   Vasoneumatic device R knee: supine, R LE elevated, medium compression, lowest temp., 15'          PT Long Term Goals - 10/21/15 1048      PT LONG TERM GOAL #1  Title Independent with HEP by 11/26/15   Status On-going     PT LONG TERM GOAL #2   Title R knee AROM 3-120 dg to allow for normal gait mechanics by 11/26/15   Status On-going  10.12.17: Pt. able to demo R knee AROM of 9-93 dg      PT LONG TERM GOAL #3   Title R hip and knee strength >/= 4/5 for improved function by 11/26/15   Status On-going     PT LONG TERM GOAL #4   Title Pt will ambulate with normal gait pattern w/o AD by 11/26/15   Status On-going               Plan - 11/04/15 1538    Clinical Impression Statement Pt. reporting worse pain over the last few days at R knee and verbalized frustration with increased pain and lack of progress with knee flexion ROM.  Pt. instructed to be patient and continue focus on knee flexion stretches with HEP at home.  Today's treatment focused on R knee ROM with various manual techniques to increase flexion ROM.  Pt. limited by R knee pain today however pain decreased to baseline 4/10 following manual focus.  Pt. progressing and will plan to measure ROM  next visit for MD f/u on 10/31.    PT Treatment/Interventions Patient/family education;Therapeutic exercise;Neuromuscular re-education;Gait training;Stair training;Manual techniques;Passive range of motion;Scar mobilization;Electrical Stimulation;Cryotherapy;Vasopneumatic Device;Taping;Functional mobility training;Therapeutic activities;ADLs/Self Care Home Management   PT Next Visit Plan MEASURE ROM; FOTO; MD NOTE for appointment on 10/31; TKR protocol, Manual therapy and Modalities PRN       Patient will benefit from skilled therapeutic intervention in order to improve the following deficits and impairments:  Pain, Decreased range of motion, Impaired flexibility, Decreased strength, Decreased scar mobility, Difficulty walking, Abnormal gait, Decreased activity tolerance, Decreased knowledge of use of DME  Visit Diagnosis: Acute pain of right knee  Stiffness of right knee, not elsewhere classified  Difficulty in walking, not elsewhere classified  Other abnormalities of gait and mobility     Problem List Patient Active Problem List   Diagnosis Date Noted  . Primary osteoarthritis of right knee 10/04/2015  . Osteoporosis 08/23/2015  . Multinodular goiter (nontoxic) 04/07/2014  . Diabetes mellitus type 2, uncontrolled, without complications ([place]) [id number]  . Insulin adverse reaction 09/27/2013  . Senile nuclear sclerosis 08/14/2012  . HTN, goal below 130/80 03/10/2011  . Hyperlipidemia with target LDL less than 70 03/10/2011  . Obesity (BMI 30.0-34.9) 03/10/2011  . Health care maintenance 03/10/2011    Bess Harvest, PTA 11/05/15 8:34 AM  [facility] [address]  [place], [place], [address] Phone: [phone]   Fax:  [phone]  Name: Wanda [name] MRN: [id number] Date of Birth: [birth year]

## 2015-11-08 ENCOUNTER — Ambulatory Visit: Payer: Medicare Other

## 2015-11-08 DIAGNOSIS — M25561 Pain in right knee: Secondary | ICD-10-CM | POA: Diagnosis not present

## 2015-11-08 DIAGNOSIS — R2689 Other abnormalities of gait and mobility: Secondary | ICD-10-CM

## 2015-11-08 DIAGNOSIS — M25661 Stiffness of right knee, not elsewhere classified: Secondary | ICD-10-CM

## 2015-11-08 DIAGNOSIS — R262 Difficulty in walking, not elsewhere classified: Secondary | ICD-10-CM | POA: Diagnosis not present

## 2015-11-08 NOTE — Therapy (Signed)
[facility] [address]  [place], [place], [address] Phone: [phone]   Fax:  [phone]  Physical Therapy Treatment  Patient Details  Name: Wanda [name] MRN: [id number] Date of Birth: 16-Jan-1940 Referring Provider: Alta Corning, MD   Encounter Date: 11/08/2015      PT End of Session - 11/08/15 1025    Visit Number 10   Number of Visits 16   Date for PT Re-Evaluation 11/26/15   PT Start Time 1018   PT Stop Time 1100   PT Time Calculation (min) 42 min   Activity Tolerance Patient tolerated treatment well;Patient limited by pain   Behavior During Therapy [facility] for tasks assessed/performed      Past Medical History:  Diagnosis Date  . Arthritis   . Cataract   . Diabetes mellitus   . Hypertension   . Skin cancer    Removed from face  . Urgency of urination   . Urinary leakage     Past Surgical History:  Procedure Laterality Date  . ABDOMINAL HYSTERECTOMY  early 80's   total  . CARPAL TUNNEL RELEASE Bilateral 1980 and 1981   both hands  . Fatty Tumor Excision    . TONSILLECTOMY AND ADENOIDECTOMY  age 60  . TOTAL KNEE ARTHROPLASTY Right 10/04/2015  . TOTAL KNEE ARTHROPLASTY Right 10/04/2015   Procedure: TOTAL KNEE ARTHROPLASTY;  Surgeon: Dorna Leitz, MD;  Location: [facility];  Service: Orthopedics;  Laterality: Right;  . TUBAL LIGATION      There were no vitals filed for this visit.      Subjective Assessment - 11/08/15 1025    Subjective Pt. reporting her knee pain has been worse over the weekend.   Patient Stated Goals "to get 120 dg (knee flexion)"   Currently in Pain? Yes   Pain Score 3    Pain Location Knee   Pain Orientation Right   Pain Descriptors / Indicators Aching   Pain Type Surgical pain   Pain Onset More than a month ago   Pain Frequency Constant   Multiple Pain Sites No            [facility] PT Assessment - 11/08/15 1038      Observation/Other Assessments   Focus on  Therapeutic Outcomes (FOTO)  53% (47% limitation)     AROM   AROM Assessment Site Knee   Right/Left Knee Right   Right Knee Extension 6   Right Knee Flexion 93     PROM   PROM Assessment Site Knee   Right/Left Knee Right   Right Knee Extension 0   Right Knee Flexion 98     Strength   Strength Assessment Site Knee   Right/Left Hip Right;Left   Right Hip Flexion 4-/5   Right Hip Extension 4-/5   Right Hip ABduction 4/5   Right Hip ADduction 4/5   Left Hip Flexion 4+/5   Left Hip Extension 4/5   Left Hip ABduction 4+/5   Left Hip ADduction 4+/5   Right/Left Knee Right;Left   Right Knee Flexion 4-/5   Right Knee Extension 4-/5   Left Knee Flexion 4/5   Left Knee Extension 4/5      Today's treatment:  Therex: Recumbent bike: half circles, 6 min  AAROM R knee flexion/HS curl with heels on peanut ball with strap 20 x 5"; therapist overpressure on last 5 reps into flexion  Manual: R knee anterior/posterior mobs  R knee flexion stretch 10  reps 5"   Gait training: Ambulation with SPC x 500 ft with supervision from therapist; cues provided to increase step length   ROM testing  MMT   Goal testing   Therex: TRX functional sqaut x 15 reps; mirror used to improve R wt. Shift         PT Long Term Goals - 2015/11/13 1033      PT LONG TERM GOAL #1   Title Independent with HEP by 11/26/15   Status On-going     PT LONG TERM GOAL #2   Title R knee AROM 3-120 dg to allow for normal gait mechanics by 11/26/15   Status On-going  Nov 13, 2015: Pt. able to demo R knee AROM of 6-93 dg.      PT LONG TERM GOAL #3   Title R hip and knee strength >/= 4/5 for improved function by 11/26/15   Status Partially Met     PT LONG TERM GOAL #4   Title Pt will ambulate with normal gait pattern w/o AD by 11/26/15   Status On-going               Plan - Nov 13, 2015 1027    Clinical Impression Statement  Pt. demonstrating good improvement in hip/knee strength with testing today  however still limited in R knee flexion ROM.  Pt. R knee PROM only to 98 dg flexion despite aggressive therapist overpressure and continued ROM focus over past treatments.  Pt. instructed to increase frequency of knee flexion stretches at home focusing on ROM with HEP.  Pt. now ambulating with normalized gait pattern however still requiring SPC.  MD f/u on 11/09/15.  Will plan to continue focus on aggressive R knee flexion ROM in coming visits.   PT Treatment/Interventions Patient/family education;Therapeutic exercise;Neuromuscular re-education;Gait training;Stair training;Manual techniques;Passive range of motion;Scar mobilization;Electrical Stimulation;Cryotherapy;Vasopneumatic Device;Taping;Functional mobility training;Therapeutic activities;ADLs/Self Care Home Management   PT Next Visit Plan TKR protocol, Manual therapy and Modalities PRN       Patient will benefit from skilled therapeutic intervention in order to improve the following deficits and impairments:  Pain, Decreased range of motion, Impaired flexibility, Decreased strength, Decreased scar mobility, Difficulty walking, Abnormal gait, Decreased activity tolerance, Decreased knowledge of use of DME  Visit Diagnosis: Acute pain of right knee  Stiffness of right knee, not elsewhere classified  Difficulty in walking, not elsewhere classified  Other abnormalities of gait and mobility       G-Codes - November 13, 2015 1100    Functional Assessment Tool Used Knee FOTO =  53% (47% limitation)   Functional Limitation Mobility: Walking and moving around   Mobility: Walking and Moving Around Current Status (S8546) At least 40 percent but less than 60 percent impaired, limited or restricted   Mobility: Walking and Moving Around Goal Status [phone]) At least 20 percent but less than 40 percent impaired, limited or restricted      Problem List Patient Active Problem List   Diagnosis Date Noted  . Primary osteoarthritis of right knee 10/04/2015   . Osteoporosis 08/23/2015  . Multinodular goiter (nontoxic) 04/07/2014  . Diabetes mellitus type 2, uncontrolled, without complications ([place]) 00/93/8182  . Insulin adverse reaction 09/27/2013  . Senile nuclear sclerosis 08/14/2012  . HTN, goal below 130/80 03/10/2011  . Hyperlipidemia with target LDL less than 70 03/10/2011  . Obesity (BMI 30.0-34.9) 03/10/2011  . Health care maintenance 03/10/2011    Bess Harvest, PTA 13-Nov-2015 12:46 PM  [facility] [address]  [address]  [place], [place], [address] Phone: [phone]   Fax:  [phone]  Name: Wanda [name] MRN: [id number] Date of Birth: April 29, 1940   Percival Spanish, PT, MPT 11/08/15, 12:46 PM  [facility] [address]  [place], [place], [address] Phone: [phone]   Fax:  [phone]

## 2015-11-09 ENCOUNTER — Ambulatory Visit: Payer: Medicare Other

## 2015-11-09 DIAGNOSIS — Z471 Aftercare following joint replacement surgery: Secondary | ICD-10-CM | POA: Diagnosis not present

## 2015-11-09 DIAGNOSIS — Z96651 Presence of right artificial knee joint: Secondary | ICD-10-CM | POA: Diagnosis not present

## 2015-11-09 DIAGNOSIS — M1711 Unilateral primary osteoarthritis, right knee: Secondary | ICD-10-CM | POA: Diagnosis not present

## 2015-11-11 ENCOUNTER — Other Ambulatory Visit (INDEPENDENT_AMBULATORY_CARE_PROVIDER_SITE_OTHER): Payer: Medicare Other

## 2015-11-11 ENCOUNTER — Ambulatory Visit: Payer: Medicare Other | Attending: Orthopedic Surgery | Admitting: Physical Therapy

## 2015-11-11 DIAGNOSIS — Z794 Long term (current) use of insulin: Secondary | ICD-10-CM

## 2015-11-11 DIAGNOSIS — E1165 Type 2 diabetes mellitus with hyperglycemia: Secondary | ICD-10-CM

## 2015-11-11 DIAGNOSIS — M25661 Stiffness of right knee, not elsewhere classified: Secondary | ICD-10-CM | POA: Diagnosis not present

## 2015-11-11 DIAGNOSIS — R2689 Other abnormalities of gait and mobility: Secondary | ICD-10-CM | POA: Diagnosis not present

## 2015-11-11 DIAGNOSIS — R262 Difficulty in walking, not elsewhere classified: Secondary | ICD-10-CM

## 2015-11-11 DIAGNOSIS — IMO0001 Reserved for inherently not codable concepts without codable children: Secondary | ICD-10-CM

## 2015-11-11 DIAGNOSIS — D62 Acute posthemorrhagic anemia: Secondary | ICD-10-CM

## 2015-11-11 DIAGNOSIS — M25561 Pain in right knee: Secondary | ICD-10-CM | POA: Diagnosis not present

## 2015-11-11 LAB — CBC
HCT: 36.5 % (ref 36.0–46.0)
HEMOGLOBIN: 12.2 g/dL (ref 12.0–15.0)
MCHC: 33.4 g/dL (ref 30.0–36.0)
MCV: 89.8 fl (ref 78.0–100.0)
Platelets: 264 10*3/uL (ref 150.0–400.0)
RBC: 4.06 Mil/uL (ref 3.87–5.11)
RDW: 14.4 % (ref 11.5–15.5)
WBC: 6.6 10*3/uL (ref 4.0–10.5)

## 2015-11-11 LAB — HEMOGLOBIN A1C: Hgb A1c MFr Bld: 8.1 % — ABNORMAL HIGH (ref 4.6–6.5)

## 2015-11-11 NOTE — Therapy (Signed)
[facility] [address]  [place], [place], [address] Phone: [phone]   Fax:  [phone]  Physical Therapy Treatment  Patient Details  Name: Wanda [name] MRN: [id number] Date of Birth: 1940/02/27 Referring Provider: Alta Corning, MD   Encounter Date: 11/11/2015      PT End of Session - 11/11/15 1313    Visit Number 10   Number of Visits 16   Date for PT Re-Evaluation 11/26/15   PT Start Time 0254   PT Stop Time 1357   PT Time Calculation (min) 44 min   Activity Tolerance Patient tolerated treatment well   Behavior During Therapy [facility] for tasks assessed/performed      Past Medical History:  Diagnosis Date  . Arthritis   . Cataract   . Diabetes mellitus   . Hypertension   . Skin cancer    Removed from face  . Urgency of urination   . Urinary leakage     Past Surgical History:  Procedure Laterality Date  . ABDOMINAL HYSTERECTOMY  early 80's   total  . CARPAL TUNNEL RELEASE Bilateral 1980 and 1981   both hands  . Fatty Tumor Excision    . TONSILLECTOMY AND ADENOIDECTOMY  age 79  . TOTAL KNEE ARTHROPLASTY Right 10/04/2015  . TOTAL KNEE ARTHROPLASTY Right 10/04/2015   Procedure: TOTAL KNEE ARTHROPLASTY;  Surgeon: Dorna Leitz, MD;  Location: [facility];  Service: Orthopedics;  Laterality: Right;  . TUBAL LIGATION      There were no vitals filed for this visit.      Subjective Assessment - 11/11/15 1314    Subjective Pt saw MD on 11/09/15 and states MD pleased with her progress. Pt reports sensation of knee "freezing (stiffening) up" later in the day.   Patient Stated Goals "to get 120 dg (knee flexion)"   Currently in Pain? Yes   Pain Score --  4-5/10   Pain Location Knee   Pain Orientation Right   Pain Onset More than a month ago            [facility] PT Assessment - 11/11/15 1313      AROM   Right Knee Flexion 96     PROM   Right Knee Extension 0   Right Knee Flexion 101            Today's Treatment  TherEx Rec Bike - rocking for increased ROM x6' AAROM R knee flexion/HS curl with heels on peanut ball with manual overpressure from PT 20 x 5"   Manual R knee patellar mobs - all directions R knee scar tissue massage R knee A/P mobs for increased flexion ROM  TherEx R SLR 2# x10 (able to tolerate added wt w/o quad lag) R SAQ 2# x10 R Fwd Step-up to aerobic step (no risers) x10, 2 pole A R Lat Step-up to aerobic step (no risers) x10, 2 pole A R Step stretch to aerobic step (1 riser) 10x10", 2 pole A TRX Squat x15          PT Education - 11/11/15 1359    Education provided Yes   Education Details Scar tissue massage 7 step stretch for knee flexion added to HEP   Person(s) Educated Patient   Methods Explanation;Demonstration;Handout   Comprehension Verbalized understanding;Returned demonstration             PT Long Term Goals - 11/08/15 1033      PT LONG TERM GOAL #1  Title Independent with HEP by 11/26/15   Status On-going     PT LONG TERM GOAL #2   Title R knee AROM 3-120 dg to allow for normal gait mechanics by 11/26/15   Status On-going  10.30.17: Pt. able to demo R knee AROM of 6-93 dg.      PT LONG TERM GOAL #3   Title R hip and knee strength >/= 4/5 for improved function by 11/26/15   Status Partially Met     PT LONG TERM GOAL #4   Title Pt will ambulate with normal gait pattern w/o AD by 11/26/15   Status On-going               Plan - 11/11/15 1319    Clinical Impression Statement Pt reporting increased stiffness today but able to demonstrate increased knee flexion after manual therapy for joint mobs including patellar mobs and scar tissue massage. Pt instructed in scar tissue massage to be performed at home as well as added step stretch to HEP.   PT Treatment/Interventions Patient/family education;Therapeutic exercise;Neuromuscular re-education;Gait training;Stair training;Manual techniques;Passive range of  motion;Scar mobilization;Electrical Stimulation;Cryotherapy;Vasopneumatic Device;Taping;Functional mobility training;Therapeutic activities;ADLs/Self Care Home Management   PT Next Visit Plan TKR protocol, Manual therapy and Modalities PRN       Patient will benefit from skilled therapeutic intervention in order to improve the following deficits and impairments:  Pain, Decreased range of motion, Impaired flexibility, Decreased strength, Decreased scar mobility, Difficulty walking, Abnormal gait, Decreased activity tolerance, Decreased knowledge of use of DME  Visit Diagnosis: Acute pain of right knee  Stiffness of right knee, not elsewhere classified  Difficulty in walking, not elsewhere classified  Other abnormalities of gait and mobility     Problem List Patient Active Problem List   Diagnosis Date Noted  . Primary osteoarthritis of right knee 10/04/2015  . Osteoporosis 08/23/2015  . Multinodular goiter (nontoxic) 04/07/2014  . Diabetes mellitus type 2, uncontrolled, without complications ([place]) 45/40/9811  . Insulin adverse reaction 09/27/2013  . Senile nuclear sclerosis 08/14/2012  . HTN, goal below 130/80 03/10/2011  . Hyperlipidemia with target LDL less than 70 03/10/2011  . Obesity (BMI 30.0-34.9) 03/10/2011  . Health care maintenance 03/10/2011    Percival Spanish, PT, MPT 11/11/2015, 2:07 PM  [facility] [address]  [place], [place], [address] Phone: [phone]   Fax:  [phone]  Name: Wanda [name] MRN: [id number] Date of Birth: December 21, 1940

## 2015-11-12 ENCOUNTER — Ambulatory Visit: Payer: Medicare Other | Admitting: Physical Therapy

## 2015-11-16 ENCOUNTER — Ambulatory Visit: Payer: Medicare Other

## 2015-11-16 DIAGNOSIS — R262 Difficulty in walking, not elsewhere classified: Secondary | ICD-10-CM | POA: Diagnosis not present

## 2015-11-16 DIAGNOSIS — M25661 Stiffness of right knee, not elsewhere classified: Secondary | ICD-10-CM | POA: Diagnosis not present

## 2015-11-16 DIAGNOSIS — M25561 Pain in right knee: Secondary | ICD-10-CM

## 2015-11-16 DIAGNOSIS — R2689 Other abnormalities of gait and mobility: Secondary | ICD-10-CM | POA: Diagnosis not present

## 2015-11-16 NOTE — Therapy (Signed)
[facility] [address]  [place], [place], [address] Phone: [phone]   Fax:  [phone]  Physical Therapy Treatment  Patient Details  Name: Wanda [name] MRN: [id number] Date of Birth: 1940-07-18 Referring Provider: Alta Corning , MD  Encounter Date: 11/16/2015      PT End of Session - 11/16/15 1331    Visit Number 12  last appointment number should (entered on 11/11/15) have been 11   Number of Visits 16   Date for PT Re-Evaluation 11/26/15   PT Start Time 1315   PT Stop Time 1400   PT Time Calculation (min) 45 min   Activity Tolerance Patient tolerated treatment well   Behavior During Therapy [facility] for tasks assessed/performed      Past Medical History:  Diagnosis Date  . Arthritis   . Cataract   . Diabetes mellitus   . Hypertension   . Skin cancer    Removed from face  . Urgency of urination   . Urinary leakage     Past Surgical History:  Procedure Laterality Date  . ABDOMINAL HYSTERECTOMY  early 80's   total  . CARPAL TUNNEL RELEASE Bilateral 1980 and 1981   both hands  . Fatty Tumor Excision    . TONSILLECTOMY AND ADENOIDECTOMY  age 2  . TOTAL KNEE ARTHROPLASTY Right 10/04/2015  . TOTAL KNEE ARTHROPLASTY Right 10/04/2015   Procedure: TOTAL KNEE ARTHROPLASTY;  Surgeon: Dorna Leitz, MD;  Location: [place];  Service: Orthopedics;  Laterality: Right;  . TUBAL LIGATION      There were no vitals filed for this visit.      Subjective Assessment - 11/16/15 1314    Subjective Pt. only reporting stiffness today.    Patient Stated Goals "to get 120 dg (knee flexion)"   Currently in Pain? No/denies   Pain Location Knee   Multiple Pain Sites No            [facility] PT Assessment - 11/16/15 1330      Assessment   Medical Diagnosis R TKR   Referring Provider Alta Corning , MD   Next MD Visit 12/07/15        Today's Treatment  Therex: Rec Bike - rocking for increased ROM x6' AAROM  R knee flexion/HS curl with heels on peanut ball with manual overpressure from therapist 20 x 5"   Manual: R knee patellar mobs - all directions R knee scar tissue massage R knee A/P mobs for increased flexion ROM  Therex: TRX Squat x 15 reps R Fwd Step-up to aerobic step (no risers) x 15, 1 pole A R Lat Step-up to aerobic step (no risers) x 15, 2 pole A  HEP review: R Step stretch to aerobic step (1 riser + 2 riser lifts each side ~ 8" height) 10 x 10", 2 pole A         PT Long Term Goals - 11/08/15 1033      PT LONG TERM GOAL #1   Title Independent with HEP by 11/26/15   Status On-going     PT LONG TERM GOAL #2   Title R knee AROM 3-120 dg to allow for normal gait mechanics by 11/26/15   Status On-going  10.30.17: Pt. able to demo R knee AROM of 6-93 dg.      PT LONG TERM GOAL #3   Title R hip and knee strength >/= 4/5 for improved function by 11/26/15   Status Partially  Met     PT LONG TERM GOAL #4   Title Pt will ambulate with normal gait pattern w/o AD by 11/26/15   Status On-going               Plan - 11/16/15 1332    Clinical Impression Statement  Today's treatment continued focus on R knee ROM with scar tissue massage, closed/open chain knee flexion stretch, and mobs.  Pt. also able to progress reps and sets with strengthening activity focusing on stepping and squating activities.  Pt. with good recall of knee flexion lunge stretch and seems to be progressing well verbalizing continued need for ROM focus at home.   PT Treatment/Interventions Patient/family education;Therapeutic exercise;Neuromuscular re-education;Gait training;Stair training;Manual techniques;Passive range of motion;Scar mobilization;Electrical Stimulation;Cryotherapy;Vasopneumatic Device;Taping;Functional mobility training;Therapeutic activities;ADLs/Self Care Home Management   PT Next Visit Plan TKR protocol, Manual therapy and Modalities PRN       Patient will benefit from skilled  therapeutic intervention in order to improve the following deficits and impairments:  Pain, Decreased range of motion, Impaired flexibility, Decreased strength, Decreased scar mobility, Difficulty walking, Abnormal gait, Decreased activity tolerance, Decreased knowledge of use of DME  Visit Diagnosis: Acute pain of right knee  Stiffness of right knee, not elsewhere classified  Difficulty in walking, not elsewhere classified  Other abnormalities of gait and mobility     Problem List Patient Active Problem List   Diagnosis Date Noted  . Primary osteoarthritis of right knee 10/04/2015  . Osteoporosis 08/23/2015  . Multinodular goiter (nontoxic) 04/07/2014  . Diabetes mellitus type 2, uncontrolled, without complications ([place]) 37/16/9678  . Insulin adverse reaction 09/27/2013  . Senile nuclear sclerosis 08/14/2012  . HTN, goal below 130/80 03/10/2011  . Hyperlipidemia with target LDL less than 70 03/10/2011  . Obesity (BMI 30.0-34.9) 03/10/2011  . Health care maintenance 03/10/2011    Bess Harvest, PTA 11/16/15 2:02 PM  [facility] [address]  [place], [place], [address] Phone: [phone]   Fax:  [phone]  Name: Wanda [name] MRN: [id number] Date of Birth: 1940-03-30

## 2015-11-19 ENCOUNTER — Ambulatory Visit: Payer: Medicare Other | Admitting: Physical Therapy

## 2015-11-19 DIAGNOSIS — M25661 Stiffness of right knee, not elsewhere classified: Secondary | ICD-10-CM | POA: Diagnosis not present

## 2015-11-19 DIAGNOSIS — M25561 Pain in right knee: Secondary | ICD-10-CM

## 2015-11-19 DIAGNOSIS — R2689 Other abnormalities of gait and mobility: Secondary | ICD-10-CM | POA: Diagnosis not present

## 2015-11-19 DIAGNOSIS — R262 Difficulty in walking, not elsewhere classified: Secondary | ICD-10-CM | POA: Diagnosis not present

## 2015-11-19 NOTE — Therapy (Signed)
[facility] [address]  [place], [place], [address] Phone: [phone]   Fax:  [phone]  Physical Therapy Treatment  Patient Details  Name: Wanda [name] MRN: [id number] Date of Birth: [birth year] Referring Provider: Alta Corning , MD  Encounter Date: 11/19/2015      PT End of Session - 11/19/15 1100    Visit Number 13   Number of Visits 16   Date for PT Re-Evaluation 11/26/15   PT Start Time 1100   PT Stop Time 1146   PT Time Calculation (min) 46 min   Activity Tolerance Patient tolerated treatment well   Behavior During Therapy [facility] for tasks assessed/performed      Past Medical History:  Diagnosis Date  . Arthritis   . Cataract   . Diabetes mellitus   . Hypertension   . Skin cancer    Removed from face  . Urgency of urination   . Urinary leakage     Past Surgical History:  Procedure Laterality Date  . ABDOMINAL HYSTERECTOMY  early 80's   total  . CARPAL TUNNEL RELEASE Bilateral 1980 and 1981   both hands  . Fatty Tumor Excision    . TONSILLECTOMY AND ADENOIDECTOMY  age 59  . TOTAL KNEE ARTHROPLASTY Right 10/04/2015  . TOTAL KNEE ARTHROPLASTY Right 10/04/2015   Procedure: TOTAL KNEE ARTHROPLASTY;  Surgeon: Dorna Leitz, MD;  Location: [facility];  Service: Orthopedics;  Laterality: Right;  . TUBAL LIGATION      There were no vitals filed for this visit.      Subjective Assessment - 11/19/15 1103    Subjective Pt reports as of Tuesday, the "bad pain" is gone and has remainded gone. Mostly just stiffness at this point. Some mild pain this morning which she attributes to being more active/busy this morning.   Pertinent History R TKR 10/04/15   Patient Stated Goals "to get 120 dg (knee flexion)"   Currently in Pain? Yes   Pain Score 3    Pain Location Knee   Pain Orientation Right            [facility] PT Assessment - 11/19/15 1100      AROM   AROM Assessment Site Knee   Right/Left Knee  Right   Right Knee Extension 2   Right Knee Flexion 100     PROM   PROM Assessment Site Knee   Right/Left Knee Right   Right Knee Extension 0   Right Knee Flexion 104           Today's Treatment  TherEx Rec Bike - rocking for increased ROM x6' TRX Squat x10 TRX Squat + Heel raises x10 Standing B 4 way SLR with green TB x10 R Fwd Step-up to aerobic step (1 riser) x10, 2 pole A R Lat Step-up to aerobic step (1 riser) x10, 2 pole A Prone R quad stretch with strap 3x30"  Manual R knee patellar mobs - all directions R knee scar tissue massage R knee A/P mobs for increased flexion ROM          PT Education - 11/19/15 1150    Education provided Yes   Education Details 4 way SLR with green TB & prone quad stretch added to HEP   Person(s) Educated Patient   Methods Explanation;Demonstration;Handout   Comprehension Verbalized understanding;Returned demonstration             PT Long Term Goals - 11/08/15 1033  PT LONG TERM GOAL #1   Title Independent with HEP by 11/26/15   Status On-going     PT LONG TERM GOAL #2   Title R knee AROM 3-120 dg to allow for normal gait mechanics by 11/26/15   Status On-going  10.30.17: Pt. able to demo R knee AROM of 6-93 dg.      PT LONG TERM GOAL #3   Title R hip and knee strength >/= 4/5 for improved function by 11/26/15   Status Partially Met     PT LONG TERM GOAL #4   Title Pt will ambulate with normal gait pattern w/o AD by 11/26/15   Status On-going               Plan - 11/19/15 1110    Clinical Impression Statement Pt reports pain is lessening with "bad pain" resolved since Tuesday. R knee AROM improving but continues to be limited by quad tightness, therefore focused on STM to distal quads and added prone quad stretch to HEP.   Rehab Potential Good   PT Treatment/Interventions Patient/family education;Therapeutic exercise;Neuromuscular re-education;Gait training;Stair training;Manual  techniques;Passive range of motion;Scar mobilization;Electrical Stimulation;Cryotherapy;Vasopneumatic Device;Taping;Functional mobility training;Therapeutic activities;ADLs/Self Care Home Management   PT Next Visit Plan TKR protocol, Manual therapy and Modalities PRN    Consulted and Agree with Plan of Care Patient      Patient will benefit from skilled therapeutic intervention in order to improve the following deficits and impairments:  Pain, Decreased range of motion, Impaired flexibility, Decreased strength, Decreased scar mobility, Difficulty walking, Abnormal gait, Decreased activity tolerance, Decreased knowledge of use of DME  Visit Diagnosis: Acute pain of right knee  Stiffness of right knee, not elsewhere classified  Difficulty in walking, not elsewhere classified  Other abnormalities of gait and mobility     Problem List Patient Active Problem List   Diagnosis Date Noted  . Primary osteoarthritis of right knee 10/04/2015  . Osteoporosis 08/23/2015  . Multinodular goiter (nontoxic) 04/07/2014  . Diabetes mellitus type 2, uncontrolled, without complications ([place]) 16/10/9602  . Insulin adverse reaction 09/27/2013  . Senile nuclear sclerosis 08/14/2012  . HTN, goal below 130/80 03/10/2011  . Hyperlipidemia with target LDL less than 70 03/10/2011  . Obesity (BMI 30.0-34.9) 03/10/2011  . Health care maintenance 03/10/2011    Percival Spanish, PT, MPT 11/19/2015, 11:57 AM  [facility] [address]  [place], [place], [address] Phone: [phone]   Fax:  [phone]  Name: Wanda [name] MRN: [id number] Date of Birth: [birth year]

## 2015-11-23 ENCOUNTER — Ambulatory Visit: Payer: Medicare Other | Admitting: Physical Therapy

## 2015-11-23 DIAGNOSIS — R262 Difficulty in walking, not elsewhere classified: Secondary | ICD-10-CM | POA: Diagnosis not present

## 2015-11-23 DIAGNOSIS — R2689 Other abnormalities of gait and mobility: Secondary | ICD-10-CM | POA: Diagnosis not present

## 2015-11-23 DIAGNOSIS — M25561 Pain in right knee: Secondary | ICD-10-CM

## 2015-11-23 DIAGNOSIS — M25661 Stiffness of right knee, not elsewhere classified: Secondary | ICD-10-CM | POA: Diagnosis not present

## 2015-11-23 NOTE — Therapy (Signed)
[facility] [address]  [place], [place], [address] Phone: [phone]   Fax:  [phone]  Physical Therapy Treatment  Patient Details  Name: Wanda [name] MRN: [id number] Date of Birth: 1940/07/18 Referring Provider: Alta Corning , MD  Encounter Date: 11/23/2015      PT End of Session - 11/23/15 1312    Visit Number 14   Number of Visits 16   Date for PT Re-Evaluation 11/26/15   PT Start Time 1312   PT Stop Time 1357   PT Time Calculation (min) 45 min   Activity Tolerance Patient tolerated treatment well   Behavior During Therapy [facility] for tasks assessed/performed      Past Medical History:  Diagnosis Date  . Arthritis   . Cataract   . Diabetes mellitus   . Hypertension   . Skin cancer    Removed from face  . Urgency of urination   . Urinary leakage     Past Surgical History:  Procedure Laterality Date  . ABDOMINAL HYSTERECTOMY  early 80's   total  . CARPAL TUNNEL RELEASE Bilateral 1980 and 1981   both hands  . Fatty Tumor Excision    . TONSILLECTOMY AND ADENOIDECTOMY  age 60  . TOTAL KNEE ARTHROPLASTY Right 10/04/2015  . TOTAL KNEE ARTHROPLASTY Right 10/04/2015   Procedure: TOTAL KNEE ARTHROPLASTY;  Surgeon: Dorna Leitz, MD;  Location: [facility];  Service: Orthopedics;  Laterality: Right;  . TUBAL LIGATION      There were no vitals filed for this visit.      Subjective Assessment - 11/23/15 1317    Subjective Pt reports another bad day over weekend, esp along back of leg, and wasn't feeling good yesterday, but better today. Notes incisional scar has become more sensitive.   Pertinent History R TKR 10/04/15   Patient Stated Goals "to get 120 dg (knee flexion)"   Currently in Pain? Yes   Pain Score 3    Pain Location Knee   Pain Orientation Right            [facility] PT Assessment - 11/23/15 1315      AROM   Right Knee Flexion 99            Today's Treatment  TherEx NuStep -  lvl 6 x 7' TRX Squat + Heel raises x15 R Fwd Step-up to aerobic step (1 riser) x10, + TKE with blue TB x10; 2 pole A R Lat Step-up to aerobic step (1 riser) x10; 2 pole A R Prostretch heelcord stretch  BATCA Knee flexion 25# B con/ecc x10, 20# B con/R ecc x10 BATCA Knee extension 20# B con/ecc x10, 20# B con/R ecc x10 R TKE with black TB 15x5"  Manual R knee patellar mobs - all directions R knee scar tissue massage Seated R knee A/P belt mobs for increased flexion ROM Prone R quad stretch with contract/relax 3x30"           PT Long Term Goals - 11/08/15 1033      PT LONG TERM GOAL #1   Title Independent with HEP by 11/26/15   Status On-going     PT LONG TERM GOAL #2   Title R knee AROM 3-120 dg to allow for normal gait mechanics by 11/26/15   Status On-going  10.30.17: Pt. able to demo R knee AROM of 6-93 dg.      PT LONG TERM GOAL #3   Title R hip and  knee strength >/= 4/5 for improved function by 11/26/15   Status Partially Met     PT LONG TERM GOAL #4   Title Pt will ambulate with normal gait pattern w/o AD by 11/26/15   Status On-going               Plan - 11/23/15 1320    Clinical Impression Statement Pt reporting no issues with updates to HEP and feels prone quad stretch is helping. Pt continuing to have bad days but feels that overall knee is doing better. R knee ROM continues to limited from expected post-op ROM and function remains limited, therefore will plan to recert at end of current cert dates on next visit.   Rehab Potential Good   PT Treatment/Interventions Patient/family education;Therapeutic exercise;Neuromuscular re-education;Gait training;Stair training;Manual techniques;Passive range of motion;Scar mobilization;Electrical Stimulation;Cryotherapy;Vasopneumatic Device;Taping;Functional mobility training;Therapeutic activities;ADLs/Self Care Home Management   PT Next Visit Plan Recert; TKR protocol, Manual therapy and Modalities PRN     Consulted and Agree with Plan of Care Patient      Patient will benefit from skilled therapeutic intervention in order to improve the following deficits and impairments:  Pain, Decreased range of motion, Impaired flexibility, Decreased strength, Decreased scar mobility, Difficulty walking, Abnormal gait, Decreased activity tolerance, Decreased knowledge of use of DME  Visit Diagnosis: Acute pain of right knee  Stiffness of right knee, not elsewhere classified  Difficulty in walking, not elsewhere classified  Other abnormalities of gait and mobility     Problem List Patient Active Problem List   Diagnosis Date Noted  . Primary osteoarthritis of right knee 10/04/2015  . Osteoporosis 08/23/2015  . Multinodular goiter (nontoxic) 04/07/2014  . Diabetes mellitus type 2, uncontrolled, without complications ([place]) 53/66/4403  . Insulin adverse reaction 09/27/2013  . Senile nuclear sclerosis 08/14/2012  . HTN, goal below 130/80 03/10/2011  . Hyperlipidemia with target LDL less than 70 03/10/2011  . Obesity (BMI 30.0-34.9) 03/10/2011  . Health care maintenance 03/10/2011    Percival Spanish, PT, MPT 11/23/2015, 3:32 PM  [facility] [address]  [place], [place], [address] Phone: [phone]   Fax:  [phone]  Name: Wanda [name] MRN: [id number] Date of Birth: 24-May-1940

## 2015-11-26 ENCOUNTER — Ambulatory Visit: Payer: Medicare Other | Admitting: Physical Therapy

## 2015-11-26 DIAGNOSIS — R262 Difficulty in walking, not elsewhere classified: Secondary | ICD-10-CM | POA: Diagnosis not present

## 2015-11-26 DIAGNOSIS — M25661 Stiffness of right knee, not elsewhere classified: Secondary | ICD-10-CM

## 2015-11-26 DIAGNOSIS — R2689 Other abnormalities of gait and mobility: Secondary | ICD-10-CM | POA: Diagnosis not present

## 2015-11-26 DIAGNOSIS — M25561 Pain in right knee: Secondary | ICD-10-CM

## 2015-11-26 NOTE — Therapy (Signed)
[facility] [address]  [place], [place], [address] Phone: [phone]   Fax:  [phone]  Physical Therapy Treatment  Patient Details  Name: Wanda [name] MRN: [id number] Date of Birth: 06/19/1940 Referring Provider: Alta Corning, MD  Encounter Date: 11/26/2015      PT End of Session - 11/26/15 1100    Visit Number 15   Number of Visits 24   Date for PT Re-Evaluation 12/24/15   PT Start Time 1100   PT Stop Time 1144   PT Time Calculation (min) 44 min   Activity Tolerance Patient tolerated treatment well   Behavior During Therapy [facility] for tasks assessed/performed      Past Medical History:  Diagnosis Date  . Arthritis   . Cataract   . Diabetes mellitus   . Hypertension   . Skin cancer    Removed from face  . Urgency of urination   . Urinary leakage     Past Surgical History:  Procedure Laterality Date  . ABDOMINAL HYSTERECTOMY  early 80's   total  . CARPAL TUNNEL RELEASE Bilateral 1980 and 1981   both hands  . Fatty Tumor Excision    . TONSILLECTOMY AND ADENOIDECTOMY  age 10  . TOTAL KNEE ARTHROPLASTY Right 10/04/2015  . TOTAL KNEE ARTHROPLASTY Right 10/04/2015   Procedure: TOTAL KNEE ARTHROPLASTY;  Surgeon: Dorna Leitz, MD;  Location: [facility];  Service: Orthopedics;  Laterality: Right;  . TUBAL LIGATION      There were no vitals filed for this visit.      Subjective Assessment - 11/26/15 1107    Subjective Pt reports pain seems worse today but unable to identify reason why. Yesterday was a good day and was able to complete HEP w/o any issues.   Pertinent History R TKR 10/04/15   Patient Stated Goals "to get 120 dg (knee flexion)"   Currently in Pain? Yes   Pain Score --  4-5/10, up to 7/10 at worst   Pain Location Knee   Pain Orientation Right   Pain Descriptors / Indicators --  "aggravating"   Pain Type Acute pain;Surgical pain   Pain Onset More than a month ago   Aggravating Factors   unknown   Pain Relieving Factors walking            [facility] PT Assessment - 11/26/15 1100      Assessment   Medical Diagnosis R TKR   Referring Provider Alta Corning, MD   Onset Date/Surgical Date 10/04/15   Next MD Visit 12/07/15     Prior Function   Level of Independence Independent     AROM   AROM Assessment Site Knee   Right/Left Knee Right   Right Knee Extension 1   Right Knee Flexion 97     PROM   Right Knee Extension 0   Right Knee Flexion 100     Strength   Right Hip Flexion 4+/5   Right Hip Extension 4/5   Right Hip ABduction 4+/5   Right Hip ADduction 4+/5   Left Hip Flexion 5/5   Left Hip Extension 4+/5   Left Hip ABduction 5/5   Left Hip ADduction 5/5   Right Knee Flexion 4+/5   Right Knee Extension 4/5   Left Knee Flexion 5/5   Left Knee Extension 5/5     Ambulation/Gait   Ambulation/Gait Assistance 7: Independent   Ambulation Distance (Feet) 500 Feet   Assistive device None  Gait Pattern Step-through pattern;Decreased hip/knee flexion - right;Poor foot clearance - right   Ambulation Surface Level;Unlevel   Stairs Assistance 5: Supervision   Stair Management Technique One rail Left;Alternating pattern;Step to pattern   Number of Stairs 24   Height of Stairs 7   Gait Comments Decreased R hip and knee flexion during gait limits heel strike and results in frequent scuffing of R foot while ambulating. Able to ascend stairs reciprocally but utilizes hip hike and mild circumduction to clear step due to limited hip and knee flexion. Requires step-to pattern on descent due to limited ROM and eccentric quad control.                     [facility] PT Treatment/Exercise - 11/26/15 1100      Exercises   Exercises Knee/Hip     Knee/Hip Exercises: Stretches   Quad Stretch Right;30 seconds;2 reps   Sports administrator Limitations prone with manual contract/relax     Knee/Hip Exercises: Aerobic   Recumbent Bike Rocking for increased ROM x 6'      Knee/Hip Exercises: Supine   Knee Flexion AROM;AAROM;Both;20 reps   Knee Flexion Limitations heels on peanut ball with AAROM from L to promote increased flexion ROM     Manual Therapy   Manual Therapy Joint mobilization;Soft tissue mobilization;Myofascial release   Joint Mobilization R knee A/P mobs for increased flexion ROM; R patellar mobs all directions   Myofascial Release R quads to promote increased flexion ROM                     PT Long Term Goals - 11/26/15 1100      PT LONG TERM GOAL #1   Title Independent with HEP by 12/24/15   Status Partially Met  Met for current HEP     PT LONG TERM GOAL #2   Title R knee AROM 3-120 dg to allow for normal gait mechanics by 12/24/15   Status Partially Met  R knee AROM: 1-97 (PROM: 0-100)     PT LONG TERM GOAL #3   Title R hip and knee strength >/= 4+/5 for improved function by 12/24/15   Status Revised  R hip & knee 4+/5 except hip & knee extension 4/5     PT LONG TERM GOAL #4   Title Pt will ambulate with normal gait pattern w/o AD by 12/24/15   Status On-going  Continues to have limited R hip/knee flexion with inconsistent foot clearance and tendency to scuff foot in place of good heel strike               Plan - 11/26/15 1152    Clinical Impression Statement Pt arrived to therapy today stating "today is a bad day" with increased pain and stiffness reported. Pt states days like today are becoming less common, but is unable to identify triggers for bad days. R knee flexion AROM initially extremely limited today at 80 dg, but able to return to near recent levels at 97 dg (best flexion AROM to date at 100 dg, with best PROM at 104 dg) after exercises and joint mobs. R LE strength overall improved with overall strength grossly 4+/5 with exception of hip and knee extension still 4/5. Continued ROM and strength deficits impact gait pattern and limit stair negotiation. Recommend recert for additional 2x/wk x 4 wks to  address these deficits.   Rehab Potential Good   PT Frequency 2x / week   PT Duration  4 weeks   PT Treatment/Interventions Patient/family education;Therapeutic exercise;Neuromuscular re-education;Gait training;Stair training;Manual techniques;Passive range of motion;Scar mobilization;Electrical Stimulation;Cryotherapy;Vasopneumatic Device;Taping;Functional mobility training;Therapeutic activities;ADLs/Self Care Home Management;Iontophoresis 69m/ml Dexamethasone   PT Next Visit Plan TKR protocol, Manual therapy for increased ROM, Modalities PRN    Consulted and Agree with Plan of Care Patient      Patient will benefit from skilled therapeutic intervention in order to improve the following deficits and impairments:  Pain, Decreased range of motion, Impaired flexibility, Decreased strength, Decreased scar mobility, Difficulty walking, Abnormal gait, Decreased activity tolerance, Decreased knowledge of use of DME  Visit Diagnosis: Acute pain of right knee  Stiffness of right knee, not elsewhere classified  Difficulty in walking, not elsewhere classified  Other abnormalities of gait and mobility     Problem List Patient Active Problem List   Diagnosis Date Noted  . Primary osteoarthritis of right knee 10/04/2015  . Osteoporosis 08/23/2015  . Multinodular goiter (nontoxic) 04/07/2014  . Diabetes mellitus type 2, uncontrolled, without complications ([place] 163/89/3734 . Insulin adverse reaction 09/27/2013  . Senile nuclear sclerosis 08/14/2012  . HTN, goal below 130/80 03/10/2011  . Hyperlipidemia with target LDL less than 70 03/10/2011  . Obesity (BMI 30.0-34.9) 03/10/2011  . Health care maintenance 03/10/2011    JPercival Spanish PT, MPT 11/26/2015, 12:20 PM  [facility] [place] [address]Phone: [phone]  Fax:  [phone] Name: Wanda LukehartMRN: [id number] of Birth:  [birth year]

## 2015-11-30 ENCOUNTER — Ambulatory Visit: Payer: Self-pay | Admitting: Internal Medicine

## 2015-11-30 ENCOUNTER — Ambulatory Visit: Payer: Medicare Other | Admitting: Physical Therapy

## 2015-11-30 DIAGNOSIS — M25561 Pain in right knee: Secondary | ICD-10-CM

## 2015-11-30 DIAGNOSIS — R2689 Other abnormalities of gait and mobility: Secondary | ICD-10-CM | POA: Diagnosis not present

## 2015-11-30 DIAGNOSIS — R262 Difficulty in walking, not elsewhere classified: Secondary | ICD-10-CM | POA: Diagnosis not present

## 2015-11-30 DIAGNOSIS — M25661 Stiffness of right knee, not elsewhere classified: Secondary | ICD-10-CM | POA: Diagnosis not present

## 2015-11-30 NOTE — Therapy (Signed)
[facility] [address]  [place], [place], [address] Phone: [phone]   Fax:  [phone]  Physical Therapy Treatment  Patient Details  Name: Wanda [name] MRN: [id number] Date of Birth: [birth year] Referring Provider: Alta Corning, MD  Encounter Date: 11/30/2015      PT End of Session - 11/30/15 1312    Visit Number 16   Number of Visits 24   Date for PT Re-Evaluation 12/24/15   PT Start Time 1312   PT Stop Time 1358   PT Time Calculation (min) 46 min   Activity Tolerance Patient tolerated treatment well   Behavior During Therapy [facility] for tasks assessed/performed      Past Medical History:  Diagnosis Date  . Arthritis   . Cataract   . Diabetes mellitus   . Hypertension   . Skin cancer    Removed from face  . Urgency of urination   . Urinary leakage     Past Surgical History:  Procedure Laterality Date  . ABDOMINAL HYSTERECTOMY  early 80's   total  . CARPAL TUNNEL RELEASE Bilateral 1980 and 1981   both hands  . Fatty Tumor Excision    . TONSILLECTOMY AND ADENOIDECTOMY  age 22  . TOTAL KNEE ARTHROPLASTY Right 10/04/2015  . TOTAL KNEE ARTHROPLASTY Right 10/04/2015   Procedure: TOTAL KNEE ARTHROPLASTY;  Surgeon: Dorna Leitz, MD;  Location: [place];  Service: Orthopedics;  Laterality: Right;  . TUBAL LIGATION      There were no vitals filed for this visit.      Subjective Assessment - 11/30/15 1317    Subjective Reports recent sensitivity around the incision seems to be lessening. No pain upon arrival to PT today, just stiffness.   Pertinent History R TKR 10/04/15   Patient Stated Goals "to get 120 dg (knee flexion)"   Currently in Pain? No/denies   Pain Onset More than a month ago                   [facility] Adult PT Treatment/Exercise - 11/30/15 1312      Knee/Hip Exercises: Stretches   Gastroc Stretch Right;2 reps;30 seconds   Gastroc Stretch Limitations Prostretch     Knee/Hip  Exercises: Aerobic   Recumbent Bike Rocking for increased ROM x 6'     Knee/Hip Exercises: Machines for Strengthening   Cybex Knee Extension 20# x10 B con/ecc, 20# x10 B con/R ecc   Cybex Knee Flexion 25# x10 B con/ecc, 20# x10 B con/R ecc     Knee/Hip Exercises: Standing   Side Lunges Both;10 reps;3 seconds   Side Lunges Limitations TRX (deferred after 9 reps d/t popping sensation in L knee)   Terminal Knee Extension Right;15 reps;Theraband   Theraband Level (Terminal Knee Extension) --  black   Lateral Step Up Right;Hand Hold: 2;Step Height: 8";10 reps;1 set  aerobic step + 2 risers   Forward Step Up Right;Hand Hold: 2;Step Height: 8";10 reps;2 sets  aerobic step + 2 risers   Forward Step Up Limitations 2nd set with TKE with blue TB   Step Down Right;10 reps;Hand Hold: 2;Step Height: 4";2 sets  aerobic step (no risers)   Step Down Limitations 2nd set R step-over   Functional Squat 15 reps;3 seconds   Functional Squat Limitations TRX Squat + Heel raises     Manual Therapy   Manual Therapy Joint mobilization;Soft tissue mobilization;Myofascial release   Joint Mobilization R knee A/P mobs for increased flexion  ROM; R patellar mobs all directions   Myofascial Release R quads to promote increased flexion ROM                     PT Long Term Goals - 11/30/15 1359      PT LONG TERM GOAL #1   Title Independent with HEP by 12/24/15   Status Partially Met  Met for current HEP     PT LONG TERM GOAL #2   Title R knee AROM 3-120 dg to allow for normal gait mechanics by 12/24/15   Status Partially Met  R knee AROM: 1-97 (PROM: 0-100)     PT LONG TERM GOAL #3   Title R hip and knee strength >/= 4+/5 for improved function by 12/24/15   Status On-going  R hip & knee 4+/5 except hip & knee extension 4/5     PT LONG TERM GOAL #4   Title Pt will ambulate with normal gait pattern w/o AD by 12/24/15   Status On-going  Continues to have limited R hip/knee flexion with  inconsistent foot clearance and tendency to scuff foot in place of good heel strike               Plan - 11/30/15 1321    Clinical Impression Statement Continued focus on increasing comfort with active hip and knee flexion for increased ease of stair negotiation. Pt able to increase fwd and lateral step-up to 8" step but struggles with 1st few reps. Pt noting R knee always feels better after therapy sessions.   Rehab Potential Good   PT Frequency --   PT Duration --   PT Treatment/Interventions Patient/family education;Therapeutic exercise;Neuromuscular re-education;Gait training;Stair training;Manual techniques;Passive range of motion;Scar mobilization;Electrical Stimulation;Cryotherapy;Vasopneumatic Device;Taping;Functional mobility training;Therapeutic activities;ADLs/Self Care Home Management;Iontophoresis 2m/ml Dexamethasone   PT Next Visit Plan TKR protocol, Manual therapy for increased ROM, Modalities PRN    Consulted and Agree with Plan of Care Patient      Patient will benefit from skilled therapeutic intervention in order to improve the following deficits and impairments:  Pain, Decreased range of motion, Impaired flexibility, Decreased strength, Decreased scar mobility, Difficulty walking, Abnormal gait, Decreased activity tolerance, Decreased knowledge of use of DME  Visit Diagnosis: Acute pain of right knee  Stiffness of right knee, not elsewhere classified  Difficulty in walking, not elsewhere classified  Other abnormalities of gait and mobility     Problem List Patient Active Problem List   Diagnosis Date Noted  . Primary osteoarthritis of right knee 10/04/2015  . Osteoporosis 08/23/2015  . Multinodular goiter (nontoxic) 04/07/2014  . Diabetes mellitus type 2, uncontrolled, without complications ([facility] 124/26/8341 . Insulin adverse reaction 09/27/2013  . Senile nuclear sclerosis 08/14/2012  . HTN, goal below 130/80 03/10/2011  . Hyperlipidemia with target  LDL less than 70 03/10/2011  . Obesity (BMI 30.0-34.9) 03/10/2011  . Health care maintenance 03/10/2011    JPercival Spanish PT, MPT 11/30/2015, 2:05 PM  [facility] [place] [address]Phone: [phone]  Fax:  [phone] Name: Wanda TellezMRN: [id number] of Birth: [birth year]

## 2015-12-07 ENCOUNTER — Ambulatory Visit: Payer: Medicare Other

## 2015-12-07 DIAGNOSIS — M25561 Pain in right knee: Secondary | ICD-10-CM | POA: Diagnosis not present

## 2015-12-07 DIAGNOSIS — M1711 Unilateral primary osteoarthritis, right knee: Secondary | ICD-10-CM | POA: Diagnosis not present

## 2015-12-07 DIAGNOSIS — R2689 Other abnormalities of gait and mobility: Secondary | ICD-10-CM

## 2015-12-07 DIAGNOSIS — R262 Difficulty in walking, not elsewhere classified: Secondary | ICD-10-CM

## 2015-12-07 DIAGNOSIS — M25661 Stiffness of right knee, not elsewhere classified: Secondary | ICD-10-CM | POA: Diagnosis not present

## 2015-12-07 NOTE — Therapy (Signed)
[facility] [address]  [place], [place], [address] Phone: [phone]   Fax:  [phone]  Physical Therapy Treatment  Patient Details  Name: Wanda [name] MRN: [id number] Date of Birth: Mar 75, 1942 Referring Provider: Alta Corning, MD  Encounter Date: 12/07/2015      PT End of Session - 12/07/15 1334    Visit Number 17   Number of Visits 24   Date for PT Re-Evaluation 12/24/15   PT Start Time 1314   PT Stop Time 1356   PT Time Calculation (min) 42 min   Activity Tolerance Patient tolerated treatment well   Behavior During Therapy [facility] for tasks assessed/performed      Past Medical History:  Diagnosis Date  . Arthritis   . Cataract   . Diabetes mellitus   . Hypertension   . Skin cancer    Removed from face  . Urgency of urination   . Urinary leakage     Past Surgical History:  Procedure Laterality Date  . ABDOMINAL HYSTERECTOMY  early 75's   total  . CARPAL TUNNEL RELEASE Bilateral 1980 and 1981   both hands  . Fatty Tumor Excision    . TONSILLECTOMY AND ADENOIDECTOMY  age 75  . TOTAL KNEE ARTHROPLASTY Right 10/04/2015  . TOTAL KNEE ARTHROPLASTY Right 10/04/2015   Procedure: TOTAL KNEE ARTHROPLASTY;  Surgeon: Dorna Leitz, MD;  Location: [place];  Service: Orthopedics;  Laterality: Right;  . TUBAL LIGATION      There were no vitals filed for this visit.      Subjective Assessment - 12/07/15 1317    Subjective Pt. reporting she hardly feels any pain at the R knee anymore and that MD f/u this morning went well with MD pleased with progress.  Pt. reporting MD proposed second TKA in January.  Pt. agreeing to this.     Patient Stated Goals "to get 120 dg (knee flexion)"   Currently in Pain? No/denies   Pain Score 0-No pain   Multiple Pain Sites No             [facility] Adult PT Treatment/Exercise - 12/07/15 1351      Knee/Hip Exercises: Stretches   Gastroc Stretch Right;2 reps;30 seconds    Gastroc Stretch Limitations Prostretch     Knee/Hip Exercises: Aerobic   Recumbent Bike Rocking for increased ROM x 6'     Knee/Hip Exercises: Machines for Strengthening   Cybex Knee Extension 20# x 12 B con/R ecc   Cybex Knee Flexion 25# x 12 B con/R ecc     Knee/Hip Exercises: Standing   Forward Step Up Right;Step Height: 8";10 reps;2 sets   Step Down Right;10 reps;Step Height: 6";2 sets   Functional Squat 15 reps;3 seconds   Other Standing Knee Exercises alternating TKE bolster step x 10 reps each side; 1 pole support    Other Standing Knee Exercises --     Manual Therapy   Manual Therapy Joint mobilization;Soft tissue mobilization   Joint Mobilization R knee A/P mobs for increased flexion ROM; R patellar mobs all directions              PT Long Term Goals - 11/30/15 1359      PT LONG TERM GOAL #1   Title Independent with HEP by 12/24/15   Status Partially Met  Met for current HEP     PT LONG TERM GOAL #2   Title R knee AROM 3-120 dg to allow for  normal gait mechanics by 12/24/15   Status Partially Met  R knee AROM: 1-97 (PROM: 0-100)     PT LONG TERM GOAL #3   Title R hip and knee strength >/= 4+/5 for improved function by 12/24/15   Status On-going  R hip & knee 4+/5 except hip & knee extension 4/5     PT LONG TERM GOAL #4   Title Pt will ambulate with normal gait pattern w/o AD by 12/24/15   Status On-going  Continues to have limited R hip/knee flexion with inconsistent foot clearance and tendency to scuff foot in place of good heel strike               Plan - 12/07/15 1402    Clinical Impression Statement Pt. reporting MD f/u went well this morning with MD pleased with progress so far.  Pt. reporting MD recommended second TKA to be scheduled sometime in January of upcoming year.  Pt. tolerated continued focus on stepping/squatting strengthening activity well pain remaining low throughout therex today.  Continued focus today on R knee  anterior/posterior mobs and patellar mobs to improve ROM.  Pt. up to 103 dg passive flexion today however still with poor pain tolerance. Pt. instructed to continue to focus on flexion ROM HEP activities to improve ROM.     PT Treatment/Interventions Patient/family education;Therapeutic exercise;Neuromuscular re-education;Gait training;Stair training;Manual techniques;Passive range of motion;Scar mobilization;Electrical Stimulation;Cryotherapy;Vasopneumatic Device;Taping;Functional mobility training;Therapeutic activities;ADLs/Self Care Home Management;Iontophoresis 66m/ml Dexamethasone   PT Next Visit Plan TKR protocol, Manual therapy for increased ROM, Modalities PRN       Patient will benefit from skilled therapeutic intervention in order to improve the following deficits and impairments:  Pain, Decreased range of motion, Impaired flexibility, Decreased strength, Decreased scar mobility, Difficulty walking, Abnormal gait, Decreased activity tolerance, Decreased knowledge of use of DME  Visit Diagnosis: Acute pain of right knee  Stiffness of right knee, not elsewhere classified  Difficulty in walking, not elsewhere classified  Other abnormalities of gait and mobility     Problem List Patient Active Problem List   Diagnosis Date Noted  . Primary osteoarthritis of right knee 10/04/2015  . Osteoporosis 08/23/2015  . Multinodular goiter (nontoxic) 04/07/2014  . Diabetes mellitus type 2, uncontrolled, without complications ([place] 106/23/7628 . Insulin adverse reaction 09/27/2013  . Senile nuclear sclerosis 08/14/2012  . HTN, goal below 130/80 03/10/2011  . Hyperlipidemia with target LDL less than 70 03/10/2011  . Obesity (BMI 30.0-34.9) 03/10/2011  . Health care maintenance 03/10/2011    MBess Harvest PTA 12/07/15 6:26 PM  [facility] [address] [place] [address]Phone: [phone]  Fax:   [phone] Name: Wanda StallMRN: [id number] of Birth: [birth year]

## 2015-12-10 ENCOUNTER — Ambulatory Visit: Payer: Medicare Other | Attending: Orthopedic Surgery | Admitting: Physical Therapy

## 2015-12-10 DIAGNOSIS — R262 Difficulty in walking, not elsewhere classified: Secondary | ICD-10-CM

## 2015-12-10 DIAGNOSIS — M25561 Pain in right knee: Secondary | ICD-10-CM

## 2015-12-10 DIAGNOSIS — M25661 Stiffness of right knee, not elsewhere classified: Secondary | ICD-10-CM

## 2015-12-10 DIAGNOSIS — R2689 Other abnormalities of gait and mobility: Secondary | ICD-10-CM

## 2015-12-10 NOTE — Therapy (Signed)
[facility] [address]  [place], [place], [address] Phone: [phone]   Fax:  [phone]  Physical Therapy Treatment  Patient Details  Name: Wanda [name] MRN: [id number] Date of Birth: April 11, 1940 Referring Provider: Alta Corning, MD  Encounter Date: 12/10/2015      PT End of Session - 12/10/15 1055    Visit Number 17   Number of Visits 24   Date for PT Re-Evaluation 12/24/15   PT Start Time 1055   PT Stop Time 1147   PT Time Calculation (min) 52 min   Activity Tolerance Patient tolerated treatment well   Behavior During Therapy [facility] for tasks assessed/performed      Past Medical History:  Diagnosis Date  . Arthritis   . Cataract   . Diabetes mellitus   . Hypertension   . Skin cancer    Removed from face  . Urgency of urination   . Urinary leakage     Past Surgical History:  Procedure Laterality Date  . ABDOMINAL HYSTERECTOMY  early 80's   total  . CARPAL TUNNEL RELEASE Bilateral 1980 and 1981   both hands  . Fatty Tumor Excision    . TONSILLECTOMY AND ADENOIDECTOMY  age 50  . TOTAL KNEE ARTHROPLASTY Right 10/04/2015  . TOTAL KNEE ARTHROPLASTY Right 10/04/2015   Procedure: TOTAL KNEE ARTHROPLASTY;  Surgeon: Dorna Leitz, MD;  Location: [place];  Service: Orthopedics;  Laterality: Right;  . TUBAL LIGATION      There were no vitals filed for this visit.      Subjective Assessment - 12/10/15 1057    Subjective Pt reports the sensitivity in the knee is starting to increase again, but pain remains minimal.   Pertinent History R TKR 10/04/15   Patient Stated Goals "to get 120 dg (knee flexion)"   Currently in Pain? Yes   Pain Score 2    Pain Location Knee   Pain Orientation Right            [facility] PT Assessment - 12/10/15 1100      AROM   AROM Assessment Site Knee   Right/Left Knee Right   Right Knee Extension 1   Right Knee Flexion 103     PROM   Right Knee Extension 0   Right Knee  Flexion 106                     [facility] Adult PT Treatment/Exercise - 12/10/15 1055      Knee/Hip Exercises: Aerobic   Nustep lvl 6 x 7'     Knee/Hip Exercises: Standing   Lateral Step Up Right;1 set;15 reps;Step Height: 8";Hand Hold: 1  aerobic step + 2 risers   Forward Step Up Right;1 set;15 reps;Step Height: 8";Hand Hold: 1  aerobic step + 2 risers   Forward Step Up Limitations + black TB TKE   Step Down Right;2 sets;15 reps;Step Height: 6";Hand Hold: 1  aerobic step + 1 riser   Step Down Limitations 2nd set R step-over   Functional Squat 15 reps;3 seconds   Functional Squat Limitations TRX Squat + Heel raises     Vasopneumatic   Number Minutes Vasopneumatic  10 minutes   Vasopnuematic Location  Knee  Rt   Vasopneumatic Pressure Medium   Vasopneumatic Temperature  Lowest     Manual Therapy   Manual Therapy Joint mobilization;Soft tissue mobilization;Myofascial release   Joint Mobilization R knee A/P belt mobs in sitting on  edge of hi/low table for increased flexion ROM; R patellar mobs all directions   Myofascial Release R quads to promote increased flexion ROM                     PT Long Term Goals - 11/30/15 1359      PT LONG TERM GOAL #1   Title Independent with HEP by 12/24/15   Status Partially Met  Met for current HEP     PT LONG TERM GOAL #2   Title R knee AROM 3-120 dg to allow for normal gait mechanics by 12/24/15   Status Partially Met  R knee AROM: 1-97 (PROM: 0-100)     PT LONG TERM GOAL #3   Title R hip and knee strength >/= 4+/5 for improved function by 12/24/15   Status On-going  R hip & knee 4+/5 except hip & knee extension 4/5     PT LONG TERM GOAL #4   Title Pt will ambulate with normal gait pattern w/o AD by 12/24/15   Status On-going  Continues to have limited R hip/knee flexion with inconsistent foot clearance and tendency to scuff foot in place of good heel strike               Plan - 12/10/15 1100     Clinical Impression Statement Pt reporting L TKR not yet scheduled but MD wanting her to have >100 flexion in R knee and able to complete sit <> stand independently w/o UE assist prior to surgery. Pt able to perform transfers independently and R knee ROM currently 1-103 AROM and 0-106 PROM but very uncomfortable at end range of motion in either direction. Continues to attempt to utilize hip circumduction as substitution for hip and knee flexion but better able to correcct with cueing. Will continue PT to maximize functional recovery from R TKR before pt proceeds with L TKR in January 2018.   PT Treatment/Interventions Patient/family education;Therapeutic exercise;Neuromuscular re-education;Gait training;Stair training;Manual techniques;Passive range of motion;Scar mobilization;Electrical Stimulation;Cryotherapy;Vasopneumatic Device;Taping;Functional mobility training;Therapeutic activities;ADLs/Self Care Home Management;Iontophoresis 41m/ml Dexamethasone   PT Next Visit Plan TKR protocol, Manual therapy for increased ROM, Modalities PRN    Consulted and Agree with Plan of Care Patient      Patient will benefit from skilled therapeutic intervention in order to improve the following deficits and impairments:  Pain, Decreased range of motion, Impaired flexibility, Decreased strength, Decreased scar mobility, Difficulty walking, Abnormal gait, Decreased activity tolerance, Decreased knowledge of use of DME  Visit Diagnosis: Acute pain of right knee  Stiffness of right knee, not elsewhere classified  Difficulty in walking, not elsewhere classified  Other abnormalities of gait and mobility     Problem List Patient Active Problem List   Diagnosis Date Noted  . Primary osteoarthritis of right knee 10/04/2015  . Osteoporosis 08/23/2015  . Multinodular goiter (nontoxic) 04/07/2014  . Diabetes mellitus type 2, uncontrolled, without complications ([place] 116/10/9602 . Insulin adverse reaction  09/27/2013  . Senile nuclear sclerosis 08/14/2012  . HTN, goal below 130/80 03/10/2011  . Hyperlipidemia with target LDL less than 70 03/10/2011  . Obesity (BMI 30.0-34.9) 03/10/2011  . Health care maintenance 03/10/2011    JPercival Spanish PT, MPT 12/10/2015, 11:49 AM  [facility] [place] [address]Phone: [phone]  Fax:  [phone] Name: RZakirah WeingartMRN: [id number] of Birth: [birth year]

## 2015-12-14 ENCOUNTER — Ambulatory Visit: Payer: Medicare Other

## 2015-12-14 DIAGNOSIS — R2689 Other abnormalities of gait and mobility: Secondary | ICD-10-CM | POA: Diagnosis not present

## 2015-12-14 DIAGNOSIS — R262 Difficulty in walking, not elsewhere classified: Secondary | ICD-10-CM

## 2015-12-14 DIAGNOSIS — M25561 Pain in right knee: Secondary | ICD-10-CM | POA: Diagnosis not present

## 2015-12-14 DIAGNOSIS — M25661 Stiffness of right knee, not elsewhere classified: Secondary | ICD-10-CM

## 2015-12-14 NOTE — Therapy (Signed)
[facility] [address]  [place], [place], [address] Phone: [phone]   Fax:  [phone]  Physical Therapy Treatment  Patient Details  Name: Wanda [name] MRN: [id number] Date of Birth: April 15, 1940 Referring Provider: Alta Corning, MD  Encounter Date: 12/14/2015      PT End of Session - 12/14/15 1140    Visit Number 19  Visit count was incorrectly taken on 12/10/15   Number of Visits 24   Date for PT Re-Evaluation 12/24/15   PT Start Time 1100   PT Stop Time 1154   PT Time Calculation (min) 54 min   Activity Tolerance Patient tolerated treatment well   Behavior During Therapy [facility] for tasks assessed/performed      Past Medical History:  Diagnosis Date  . Arthritis   . Cataract   . Diabetes mellitus   . Hypertension   . Skin cancer    Removed from face  . Urgency of urination   . Urinary leakage     Past Surgical History:  Procedure Laterality Date  . ABDOMINAL HYSTERECTOMY  early 80's   total  . CARPAL TUNNEL RELEASE Bilateral 1980 and 1981   both hands  . Fatty Tumor Excision    . TONSILLECTOMY AND ADENOIDECTOMY  age 25  . TOTAL KNEE ARTHROPLASTY Right 10/04/2015  . TOTAL KNEE ARTHROPLASTY Right 10/04/2015   Procedure: TOTAL KNEE ARTHROPLASTY;  Surgeon: Dorna Leitz, MD;  Location: [place];  Service: Orthopedics;  Laterality: Right;  . TUBAL LIGATION      There were no vitals filed for this visit.      Subjective Assessment - 12/14/15 1123    Patient Stated Goals "to get 120 dg (knee flexion)"   Currently in Pain? Yes   Pain Score 1    Pain Location Knee   Pain Orientation Right   Pain Descriptors / Indicators Aching   Pain Type Acute pain;Surgical pain   Pain Onset More than a month ago   Pain Frequency Constant   Multiple Pain Sites No              [facility] Adult PT Treatment/Exercise - 12/14/15 1127      Knee/Hip Exercises: Stretches   Gastroc Stretch Right;2 reps;30 seconds    Gastroc Stretch Limitations Prostretch  x 2 sets with second set with strap   Other Knee/Hip Stretches ITB stretch with strap x 1 min      Knee/Hip Exercises: Aerobic   Recumbent Bike Rocking for increased ROM x 6'     Knee/Hip Exercises: Standing   Lateral Step Up Right;1 set;Step Height: 8";Hand Hold: 1;20 reps;Left   Forward Step Up Right;1 set;Step Height: 8";Hand Hold: 1;20 reps   Forward Step Up Limitations --   Step Down Right;2 sets;Step Height: 6";Hand Hold: 1;20 reps   Step Down Limitations Both sets with R step up L down focusing on R eccentric quad control   Functional Squat 15 reps;3 seconds   Functional Squat Limitations TRX deep squat      Vasopneumatic   Number Minutes Vasopneumatic  10 minutes   Vasopnuematic Location  Knee  R   Vasopneumatic Pressure Medium   Vasopneumatic Temperature  medium     Manual Therapy   Manual Therapy Joint mobilization;Soft tissue mobilization   Manual therapy comments STM to incision   Joint Mobilization R knee A/P manual mobs; patellar mobs all directions with heavy overpressure superior/inferior for increased ROM  PT Long Term Goals - 11/30/15 1359      PT LONG TERM GOAL #1   Title Independent with HEP by 12/24/15   Status Partially Met  Met for current HEP     PT LONG TERM GOAL #2   Title R knee AROM 3-120 dg to allow for normal gait mechanics by 12/24/15   Status Partially Met  R knee AROM: 1-97 (PROM: 0-100)     PT LONG TERM GOAL #3   Title R hip and knee strength >/= 4+/5 for improved function by 12/24/15   Status On-going  R hip & knee 4+/5 except hip & knee extension 4/5     PT LONG TERM GOAL #4   Title Pt will ambulate with normal gait pattern w/o AD by 12/24/15   Status On-going  Continues to have limited R hip/knee flexion with inconsistent foot clearance and tendency to scuff foot in place of good heel strike               Plan - 12/14/15 1141    Clinical Impression Statement  Pt. tolerated progression in reps with all stepping activity well today.  Continued focus today on aggressive R knee joint, and patellar mobs for increased ROM however ROM not taken today.  Pt. tolerated aggressive end range knee flexion stretching better today than past treatments  Ice/compression used today to end treatment upon pt. request.  Pt. seems to be progressing well at this point.     PT Treatment/Interventions Patient/family education;Therapeutic exercise;Neuromuscular re-education;Gait training;Stair training;Manual techniques;Passive range of motion;Scar mobilization;Electrical Stimulation;Cryotherapy;Vasopneumatic Device;Taping;Functional mobility training;Therapeutic activities;ADLs/Self Care Home Management;Iontophoresis 54m/ml Dexamethasone   PT Next Visit Plan TKR protocol, Manual therapy for increased ROM, Modalities PRN       Patient will benefit from skilled therapeutic intervention in order to improve the following deficits and impairments:  Pain, Decreased range of motion, Impaired flexibility, Decreased strength, Decreased scar mobility, Difficulty walking, Abnormal gait, Decreased activity tolerance, Decreased knowledge of use of DME  Visit Diagnosis: Acute pain of right knee  Stiffness of right knee, not elsewhere classified  Difficulty in walking, not elsewhere classified  Other abnormalities of gait and mobility     Problem List Patient Active Problem List   Diagnosis Date Noted  . Primary osteoarthritis of right knee 10/04/2015  . Osteoporosis 08/23/2015  . Multinodular goiter (nontoxic) 04/07/2014  . Diabetes mellitus type 2, uncontrolled, without complications ([place] 134/28/7681 . Insulin adverse reaction 09/27/2013  . Senile nuclear sclerosis 08/14/2012  . HTN, goal below 130/80 03/10/2011  . Hyperlipidemia with target LDL less than 70 03/10/2011  . Obesity (BMI 30.0-34.9) 03/10/2011  . Health care maintenance 03/10/2011    MBess Harvest PTA 12/14/15  12:12 PM  [facility] [place] [address]Phone: [phone]  Fax:  [phone] Name: RLakeysha SlutskyMRN: [id number] of Birth: 105-Jan-1942

## 2015-12-16 ENCOUNTER — Other Ambulatory Visit: Payer: Self-pay

## 2015-12-17 ENCOUNTER — Ambulatory Visit: Payer: Medicare Other | Admitting: Physical Therapy

## 2015-12-17 DIAGNOSIS — R2689 Other abnormalities of gait and mobility: Secondary | ICD-10-CM | POA: Diagnosis not present

## 2015-12-17 DIAGNOSIS — R262 Difficulty in walking, not elsewhere classified: Secondary | ICD-10-CM | POA: Diagnosis not present

## 2015-12-17 DIAGNOSIS — M25661 Stiffness of right knee, not elsewhere classified: Secondary | ICD-10-CM

## 2015-12-17 DIAGNOSIS — M25561 Pain in right knee: Secondary | ICD-10-CM | POA: Diagnosis not present

## 2015-12-17 NOTE — Therapy (Signed)
[facility] [address]  [place], [place], [address] Phone: [phone]   Fax:  [phone]  Physical Therapy Treatment  Patient Details  Name: Wanda [name] MRN: [id number] Date of Birth: 1940-02-11 Referring Provider: Alta Corning, MD  Encounter Date: 12/17/2015      PT End of Session - 12/17/15 1100    Visit Number 20  Visit count was incorrectly taken on 12/10/15   Number of Visits 24   Date for PT Re-Evaluation 12/24/15   PT Start Time 1100   PT Stop Time 1141   PT Time Calculation (min) 41 min   Activity Tolerance Patient tolerated treatment well   Behavior During Therapy [facility] for tasks assessed/performed      Past Medical History:  Diagnosis Date  . Arthritis   . Cataract   . Diabetes mellitus   . Hypertension   . Skin cancer    Removed from face  . Urgency of urination   . Urinary leakage     Past Surgical History:  Procedure Laterality Date  . ABDOMINAL HYSTERECTOMY  early 80's   total  . CARPAL TUNNEL RELEASE Bilateral 1980 and 1981   both hands  . Fatty Tumor Excision    . TONSILLECTOMY AND ADENOIDECTOMY  age 57  . TOTAL KNEE ARTHROPLASTY Right 10/04/2015  . TOTAL KNEE ARTHROPLASTY Right 10/04/2015   Procedure: TOTAL KNEE ARTHROPLASTY;  Surgeon: Dorna Leitz, MD;  Location: [place];  Service: Orthopedics;  Laterality: Right;  . TUBAL LIGATION      There were no vitals filed for this visit.      Subjective Assessment - 12/17/15 1106    Subjective Pt reports she woke with a pain at her medial joint line today that made her feel like she had to keep the knee straight. Pain has now resolved.   Patient Stated Goals "to get 120 dg (knee flexion)"   Currently in Pain? No/denies   Pain Onset More than a month ago            [facility] PT Assessment - 12/17/15 1100      Assessment   Medical Diagnosis R TKR   Referring Provider Alta Corning, MD   Onset Date/Surgical Date 10/04/15   Next  MD Visit 01/12/16     Observation/Other Assessments   Focus on Therapeutic Outcomes (FOTO)  Knee - 62% (38% limitation); predicted 66% (34% limitation)     AROM   AROM Assessment Site Knee   Right/Left Knee Right   Right Knee Extension 0   Right Knee Flexion 104     Strength   Right Hip Flexion 4+/5   Right Hip Extension 4/5   Right Hip ABduction 5/5   Right Hip ADduction 5/5   Left Hip Flexion 5/5   Left Hip Extension 4+/5   Left Hip ABduction 5/5   Left Hip ADduction 5/5   Right Knee Flexion 4+/5   Right Knee Extension 4+/5   Left Knee Flexion 5/5   Left Knee Extension 5/5                     [facility] Adult PT Treatment/Exercise - 12/17/15 1100      Ambulation/Gait   Ambulation/Gait Assistance 7: Independent   Ambulation Distance (Feet) 500 Feet   Assistive device None   Gait Pattern Step-through pattern;Decreased hip/knee flexion - right;Poor foot clearance - right   Ambulation Surface Level;Indoor   Stairs Assistance 5: Supervision  Stair Management Technique One rail Right;Alternating pattern   Number of Stairs 12   Height of Stairs 7   Gait Comments Reminders for increased hip & knee flexion with heel strike to improve foot clearance with gait      Knee/Hip Exercises: Stretches   Knee: Self-Stretch to increase Flexion Right;10 seconds  10 reps   Knee: Self-Stretch Limitations Foot on BATCA seat at lowest height   Gastroc Stretch Right;2 reps;30 seconds   Gastroc Stretch Limitations Prostretch     Knee/Hip Exercises: Aerobic   Recumbent Bike Rocking for increased ROM x 6' - able to complete 1 full revolution     Knee/Hip Exercises: Standing   Step Down Right;15 reps;2 sets;Hand Hold: 1;Step Height: 6"   Step Down Limitations 1 set lateral, 2nd set forward   Functional Squat 15 reps;3 seconds;2 sets   Functional Squat Limitations TRX deep squat + heel raises on 2nd set   Other Standing Knee Exercises R Step-over on 6" step x10                      PT Long Term Goals - 2015/12/20 1116      PT LONG TERM GOAL #1   Title Independent with HEP by 12/24/15   Status Partially Met  Met for current HEP     PT LONG TERM GOAL #2   Title R knee AROM 3-120 dg to allow for normal gait mechanics by 12/24/15   Status Partially Met  R knee AROM: 0-104     PT LONG TERM GOAL #3   Title R hip and knee strength >/= 4+/5 for improved function by 12/24/15   Status Partially Met  R hip & knee 4+/5 except hip extension 4/5     PT LONG TERM GOAL #4   Title Pt will ambulate with normal gait pattern w/o AD by 12/24/15   Status Partially Met  Improving R hip/knee flexion but still inconsistent foot clearance with intermittent tendency to scuff foot in place of good heel strike               Plan - 12/20/15 1109    Clinical Impression Statement Pt continues to demonstrate good progress with slowly improving ROM (currently 0-104 for R knee AROM) and improving strength with all but R hip extension 4+/5 or better. Gait pattern still demonstrates inconsistent foot clearance due to limited hip and knee flexion and decreased heel strike, but now able to ascend/descend stairs reciprocally although with increased efffort required due to ROM limitations and limited eccentric control. Pt will benefit from continued PT to address these deficits.    PT Treatment/Interventions Patient/family education;Therapeutic exercise;Neuromuscular re-education;Gait training;Stair training;Manual techniques;Passive range of motion;Scar mobilization;Electrical Stimulation;Cryotherapy;Vasopneumatic Device;Taping;Functional mobility training;Therapeutic activities;ADLs/Self Care Home Management;Iontophoresis 58m/ml Dexamethasone   PT Next Visit Plan TKR protocol, Manual therapy for increased ROM, Modalities PRN    Consulted and Agree with Plan of Care Patient      Patient will benefit from skilled therapeutic intervention in order to improve the  following deficits and impairments:  Pain, Decreased range of motion, Impaired flexibility, Decreased strength, Decreased scar mobility, Difficulty walking, Abnormal gait, Decreased activity tolerance, Decreased knowledge of use of DME  Visit Diagnosis: Acute pain of right knee  Stiffness of right knee, not elsewhere classified  Difficulty in walking, not elsewhere classified  Other abnormalities of gait and mobility       G-Codes - [id number]    Functional Assessment Tool Used Knee FOTO =  62% (38% limitation)   Functional Limitation Mobility: Walking and moving around   Mobility: Walking and Moving Around Current Status [phone]) At least 20 percent but less than 40 percent impaired, limited or restricted   Mobility: Walking and Moving Around Goal Status [phone]) At least 20 percent but less than 40 percent impaired, limited or restricted      Problem List Patient Active Problem List   Diagnosis Date Noted  . Primary osteoarthritis of right knee 10/04/2015  . Osteoporosis 08/23/2015  . Multinodular goiter (nontoxic) 04/07/2014  . Diabetes mellitus type 2, uncontrolled, without complications ([place]) 41/28/7867  . Insulin adverse reaction 09/27/2013  . Senile nuclear sclerosis 08/14/2012  . HTN, goal below 130/80 03/10/2011  . Hyperlipidemia with target LDL less than 70 03/10/2011  . Obesity (BMI 30.0-34.9) 03/10/2011  . Health care maintenance 03/10/2011    Physical Therapy Progress Note  Dates of Reporting Period: 11/11/15 to 12/17/15  Objective Reports of Subjective Statement: Pt continues to demonstrate good progress with slowly improving ROM (currently 0-104 for R knee AROM) and improving strength with all but R hip extension 4+/5 or better. Gait pattern still demonstrates inconsistent foot clearance due to limited hip and knee flexion and decreased heel strike, but now able to ascend/descend stairs reciprocally although with increased efffort required due to ROM limitations  and limited eccentric control.   Objective Measurements: Refer to above assessment  Goal Update: LTG's all partially met (see above details)  Plan: Continue PT x 1 additional week  Reason Skilled Services are Required: Increase R knee functional ROM & strength for improved gait and stair negotiation prior to upcoming L TKR   Percival Spanish, PT, MPT 12/17/2015, 12:10 PM  [facility] [address]  [place], [place], [address] Phone: [phone]   Fax:  [phone]  Name: Caroleann Casler MRN: [id number] Date of Birth: 09-29-1940

## 2015-12-21 ENCOUNTER — Ambulatory Visit: Payer: Medicare Other

## 2015-12-21 DIAGNOSIS — M25661 Stiffness of right knee, not elsewhere classified: Secondary | ICD-10-CM | POA: Diagnosis not present

## 2015-12-21 DIAGNOSIS — R2689 Other abnormalities of gait and mobility: Secondary | ICD-10-CM | POA: Diagnosis not present

## 2015-12-21 DIAGNOSIS — M25561 Pain in right knee: Secondary | ICD-10-CM

## 2015-12-21 DIAGNOSIS — R262 Difficulty in walking, not elsewhere classified: Secondary | ICD-10-CM | POA: Diagnosis not present

## 2015-12-21 NOTE — Therapy (Signed)
[facility] [address]  [address] [place], [place], [address] Phone: [phone]   Fax:  [phone]  Physical Therapy Treatment  Patient Details  Name: Wanda [name] MRN: [id number] Date of Birth: [birth year] Referring Provider: Harvie Junior, MD  Encounter Date: 12/21/2015      PT End of Session - 12/21/15 1124    Visit Number 21   Number of Visits 24   Date for PT Re-Evaluation 12/24/15   PT Start Time 1100   PT Stop Time 1145   PT Time Calculation (min) 45 min   Activity Tolerance Patient tolerated treatment well   Behavior During Therapy [facility] for tasks assessed/performed      Past Medical History:  Diagnosis Date  . Arthritis   . Cataract   . Diabetes mellitus   . Hypertension   . Skin cancer    Removed from face  . Urgency of urination   . Urinary leakage     Past Surgical History:  Procedure Laterality Date  . ABDOMINAL HYSTERECTOMY  early 80's   total  . CARPAL TUNNEL RELEASE Bilateral 1980 and 1981   both hands  . Fatty Tumor Excision    . TONSILLECTOMY AND ADENOIDECTOMY  age 75  . TOTAL KNEE ARTHROPLASTY Right 10/04/2015  . TOTAL KNEE ARTHROPLASTY Right 10/04/2015   Procedure: TOTAL KNEE ARTHROPLASTY;  Surgeon: Jodi Geralds, MD;  Location: [facility];  Service: Orthopedics;  Laterality: Right;  . TUBAL LIGATION      There were no vitals filed for this visit.      Subjective Assessment - 12/21/15 1100    Subjective Pt. reporting worse R knee pain today with no known trigger.    Patient Stated Goals "to get 120 dg (knee flexion)"   Currently in Pain? Yes   Pain Score 6    Pain Location Knee   Pain Orientation Right   Pain Descriptors / Indicators Aching   Pain Type Acute pain;Surgical pain   Pain Onset More than a month ago   Pain Frequency Constant   Multiple Pain Sites No           [facility] Adult PT Treatment/Exercise - 12/21/15 1132      Knee/Hip Exercises: Stretches   Gastroc Stretch  Right;2 reps;30 seconds   Gastroc Stretch Limitations Prostretch   Other Knee/Hip Stretches Prone quad stretch with strap x 1 min and bolster under anterior thigh     Knee/Hip Exercises: Aerobic   Recumbent Bike Rocking for increased ROM x 6' - able to complete 1 full revolution     Knee/Hip Exercises: Standing   Step Down Right;2 sets;Hand Hold: 1;Step Height: 8";10 reps  Focusing on eccentric quad control    Step Down Limitations 1 set lateral, 2nd set forward   Functional Squat 10 reps;3 seconds   Functional Squat Limitations staggered stance with R LE back x 10 reps 3"    Other Standing Knee Exercises R Step-over on 6" step x 20 reps   focusing on eccentric quad lowring control      Knee/Hip Exercises: Supine   Other Supine Knee/Hip Exercises SL bridge with heels on peanut p-ball 2 x 15 reps 3" hold      Vasopneumatic   Number Minutes Vasopneumatic  10 minutes   Vasopnuematic Location  Knee   Vasopneumatic Pressure Medium   Vasopneumatic Temperature  lowest     Manual Therapy   Manual Therapy Joint mobilization;Soft tissue mobilization   Joint Mobilization  R knee A/P manual mobs; patellar mobs all directions with heavy overpressure superior/inferior for increased ROM   Myofascial Release R quads to promote increased flexion ROM           PT Long Term Goals - 12/17/15 1116      PT LONG TERM GOAL #1   Title Independent with HEP by 12/24/15   Status Partially Met  Met for current HEP     PT LONG TERM GOAL #2   Title R knee AROM 3-120 dg to allow for normal gait mechanics by 12/24/15   Status Partially Met  R knee AROM: 0-104     PT LONG TERM GOAL #3   Title R hip and knee strength >/= 4+/5 for improved function by 12/24/15   Status Partially Met  R hip & knee 4+/5 except hip extension 4/5     PT LONG TERM GOAL #4   Title Pt will ambulate with normal gait pattern w/o AD by 12/24/15   Status Partially Met  Improving R hip/knee flexion but still inconsistent foot  clearance with intermittent tendency to scuff foot in place of good heel strike               Plan - 12/21/15 1125    Clinical Impression Statement Pt. with significant R knee pain initially today without known trigger.  This pain decreased with therex and pt. able to leave treatment pain free.  All stepping activity progressed to 8" today.  Pt. still demonstrating R quad instability with lateral and forward step-downs at 8" height however pain free with these.  Continued focus today on manual stretching and mobilizations to R knee to improve ROM.  Pt. admitting to poor adherence to prone quad stretching activities at home and demonstrating continued RF tightness today with stretching.  Significant time taken today with strumming/STM in Mod Thomas stretch position to R quad muscle to improve flexibility and ROM.  Pt. will continue to benefit from skilled therapy to improve R knee ROM and eccentric control with stepping.     PT Treatment/Interventions Patient/family education;Therapeutic exercise;Neuromuscular re-education;Gait training;Stair training;Manual techniques;Passive range of motion;Scar mobilization;Electrical Stimulation;Cryotherapy;Vasopneumatic Device;Taping;Functional mobility training;Therapeutic activities;ADLs/Self Care Home Management;Iontophoresis 36m/ml Dexamethasone   PT Next Visit Plan Date for Re-eval. 12/24/15 (reminder); TKR protocol, Manual therapy for increased ROM, Modalities PRN       Patient will benefit from skilled therapeutic intervention in order to improve the following deficits and impairments:  Pain, Decreased range of motion, Impaired flexibility, Decreased strength, Decreased scar mobility, Difficulty walking, Abnormal gait, Decreased activity tolerance, Decreased knowledge of use of DME  Visit Diagnosis: Acute pain of right knee  Stiffness of right knee, not elsewhere classified  Difficulty in walking, not elsewhere classified  Other abnormalities of  gait and mobility     Problem List Patient Active Problem List   Diagnosis Date Noted  . Primary osteoarthritis of right knee 10/04/2015  . Osteoporosis 08/23/2015  . Multinodular goiter (nontoxic) 04/07/2014  . Diabetes mellitus type 2, uncontrolled, without complications ([place] 118/29/9371 . Insulin adverse reaction 09/27/2013  . Senile nuclear sclerosis 08/14/2012  . HTN, goal below 130/80 03/10/2011  . Hyperlipidemia with target LDL less than 70 03/10/2011  . Obesity (BMI 30.0-34.9) 03/10/2011  . Health care maintenance 03/10/2011   MBess Harvest PTA 12/21/15 12:14 PM  [facility] [address] [place] [address]: [phone]  Fax:  [phone] Name: RSoyla BainterMRN:  [id number] Date of Birth: 29-Dec-1940

## 2015-12-24 ENCOUNTER — Ambulatory Visit: Payer: Medicare Other | Admitting: Physical Therapy

## 2015-12-24 DIAGNOSIS — M25661 Stiffness of right knee, not elsewhere classified: Secondary | ICD-10-CM | POA: Diagnosis not present

## 2015-12-24 DIAGNOSIS — M25561 Pain in right knee: Secondary | ICD-10-CM | POA: Diagnosis not present

## 2015-12-24 DIAGNOSIS — R262 Difficulty in walking, not elsewhere classified: Secondary | ICD-10-CM

## 2015-12-24 DIAGNOSIS — R2689 Other abnormalities of gait and mobility: Secondary | ICD-10-CM | POA: Diagnosis not present

## 2015-12-24 NOTE — Therapy (Signed)
[facility] [address]  [place], [place], [address] Phone: [phone]   Fax:  [phone]  Physical Therapy Treatment  Patient Details  Name: Wanda [name] MRN: [id number] Date of Birth: 1940-03-17 Referring Provider: Alta Corning, MD  Encounter Date: 12/24/2015      PT End of Session - 12/24/15 1106    Visit Number 22   Number of Visits 24   Date for PT Re-Evaluation 12/24/15   PT Start Time 1106   PT Stop Time 1144   PT Time Calculation (min) 38 min   Activity Tolerance Patient tolerated treatment well   Behavior During Therapy [facility] for tasks assessed/performed      Past Medical History:  Diagnosis Date  . Arthritis   . Cataract   . Diabetes mellitus   . Hypertension   . Skin cancer    Removed from face  . Urgency of urination   . Urinary leakage     Past Surgical History:  Procedure Laterality Date  . ABDOMINAL HYSTERECTOMY  early 80's   total  . CARPAL TUNNEL RELEASE Bilateral 1980 and 1981   both hands  . Fatty Tumor Excision    . TONSILLECTOMY AND ADENOIDECTOMY  age 81  . TOTAL KNEE ARTHROPLASTY Right 10/04/2015  . TOTAL KNEE ARTHROPLASTY Right 10/04/2015   Procedure: TOTAL KNEE ARTHROPLASTY;  Surgeon: Dorna Leitz, MD;  Location: [place];  Service: Orthopedics;  Laterality: Right;  . TUBAL LIGATION      There were no vitals filed for this visit.      Subjective Assessment - 12/24/15 1109    Subjective Pt reports "today is a good day" with pain just at a "mildly aggravating" level today. Also noting some continued hypersensitivity.   Patient Stated Goals "to get 120 dg (knee flexion)"   Currently in Pain? Yes   Pain Score 2    Pain Location Knee   Pain Orientation Right   Pain Descriptors / Indicators --  aggravating   Pain Onset More than a month ago            [facility] PT Assessment - 12/24/15 1106      Assessment   Medical Diagnosis R TKR   Referring Provider Alta Corning, MD   Onset Date/Surgical Date 10/04/15   Next MD Visit 01/12/16     AROM   Right Knee Extension 0   Right Knee Flexion 110     Strength   Right Hip Flexion 4+/5   Right Hip Extension 4/5   Right Hip ABduction 5/5   Right Hip ADduction 5/5   Left Hip Flexion 5/5   Left Hip Extension 4+/5   Left Hip ABduction 5/5   Left Hip ADduction 5/5   Right Knee Flexion 5/5   Right Knee Extension 5/5   Left Knee Flexion 5/5   Left Knee Extension 5/5                     [facility] Adult PT Treatment/Exercise - 12/24/15 1106      Self-Care   Self-Care Scar Mobilizations   Scar Mobilizations reviewed need for continued scar mobs/massage as well as patellar mobs to promote increased knee flexion ROM     Knee/Hip Exercises: Aerobic   Recumbent Bike Rocking for increased ROM x 6' - able to complete 1 full revolution     Knee/Hip Exercises: Machines for Strengthening   Cybex Knee Extension 20# x10 B con/ecc,  20# x10 B con/R ecc   Cybex Knee Flexion 25# x10 B con/ecc, 20# x10 B con/R ecc   Cybex Leg Press 20# x15     Manual Therapy   Manual Therapy Joint mobilization;Soft tissue mobilization   Joint Mobilization R knee A/P manual mobs; patellar mobs all directions with emphasis on superior/inferior for increased ROM   Myofascial Release R quads to promote increased flexion ROM                     PT Long Term Goals - Jan 17, 2016 1129      PT LONG TERM GOAL #1   Title Independent with HEP by 01-17-16   Status Achieved     PT LONG TERM GOAL #2   Title R knee AROM 3-120 dg to allow for normal gait mechanics by 01-17-16   Status Partially Met  R knee AROM: 0-110     PT LONG TERM GOAL #3   Title R hip and knee strength >/= 4+/5 for improved function by 2016/01/17   Status Partially Met  Met except R hip extension 4/5     PT LONG TERM GOAL #4   Title Pt will ambulate with normal gait pattern w/o AD by 01-17-2016   Status Achieved               Plan -  2016-01-17 1148    Clinical Impression Statement Pt has demonstrated good progress with PT s/p R TKR. R knee AROM now 0-110 with R LE strength 4+/5 or greater except hip extension 4/5. Pt is able to ambulate w/o AD with good gait pattern and has even tried running slowly on her own. All goals met or nearly met and pt feels confident with continuing with HEP and gym program at this point, therefore will proceed with discharge from PT for this episode.   PT Treatment/Interventions Patient/family education;Therapeutic exercise;Neuromuscular re-education;Gait training;Stair training;Manual techniques;Passive range of motion;Scar mobilization;Electrical Stimulation;Cryotherapy;Vasopneumatic Device;Taping;Functional mobility training;Therapeutic activities;ADLs/Self Care Home Management;Iontophoresis 41m/ml Dexamethasone   PT Next Visit Plan Discharge   Consulted and Agree with Plan of Care Patient      Patient will benefit from skilled therapeutic intervention in order to improve the following deficits and impairments:  Pain, Decreased range of motion, Impaired flexibility, Decreased strength, Decreased scar mobility, Difficulty walking, Abnormal gait, Decreased activity tolerance, Decreased knowledge of use of DME  Visit Diagnosis: Acute pain of right knee  Stiffness of right knee, not elsewhere classified  Difficulty in walking, not elsewhere classified  Other abnormalities of gait and mobility       G-Codes - 1January 08, [id number]    Functional Assessment Tool Used Knee FOTO =  62% (38% limitation)   Functional Limitation Mobility: Walking and moving around   Mobility: Walking and Moving Around Goal Status [phone] At least 20 percent but less than 40 percent impaired, limited or restricted   Mobility: Walking and Moving Around Discharge Status [phone] At least 20 percent but less than 40 percent impaired, limited or restricted      Problem List Patient Active Problem List   Diagnosis Date Noted   . Primary osteoarthritis of right knee 10/04/2015  . Osteoporosis 08/23/2015  . Multinodular goiter (nontoxic) 04/07/2014  . Diabetes mellitus type 2, uncontrolled, without complications [place] 120/25/4270 . Insulin adverse reaction 09/27/2013  . Senile nuclear sclerosis 08/14/2012  . HTN, goal below 130/80 03/10/2011  . Hyperlipidemia with target LDL less than 70 03/10/2011  . Obesity (BMI 30.0-34.9) 03/10/2011  .  Health care maintenance 03/10/2011    PHYSICAL THERAPY DISCHARGE SUMMARY  Visits from Start of Care: 22  Current functional level related to goals / functional outcomes:    Refer to above clinical impression.   Remaining deficits:   R knee flexion ROM only 110 dg - pt aware of continued stretches and exercises to promote increased flexion. Mild hip extensor weakness - pt independent with HEP and gym program for continued strengthening.   Education / Equipment:   HEP & gym program  Plan: Patient agrees to discharge.  Patient goals were partially met. Patient is being discharged due to being pleased with the current functional level.  ?????      Percival Spanish, PT, MPT 12/24/2015, 12:01 PM  [facility] [address]  [place], [place], [address] Phone: [phone]   Fax:  [phone]  Name: Wanda [name] MRN: [id number] Date of Birth: 21-Jun-1940

## 2015-12-29 ENCOUNTER — Other Ambulatory Visit: Payer: Self-pay | Admitting: Orthopedic Surgery

## 2016-01-13 DIAGNOSIS — Z9889 Other specified postprocedural states: Secondary | ICD-10-CM | POA: Diagnosis not present

## 2016-01-20 ENCOUNTER — Encounter (HOSPITAL_COMMUNITY)
Admission: RE | Admit: 2016-01-20 | Discharge: 2016-01-20 | Disposition: A | Discharge status: Home / Self Care | Payer: Medicare Other | Source: Ambulatory Visit | Attending: Orthopedic Surgery | Admitting: Orthopedic Surgery

## 2016-01-20 ENCOUNTER — Encounter (HOSPITAL_COMMUNITY): Payer: Self-pay

## 2016-01-20 DIAGNOSIS — Z01812 Encounter for preprocedural laboratory examination: Secondary | ICD-10-CM | POA: Insufficient documentation

## 2016-01-20 DIAGNOSIS — L821 Other seborrheic keratosis: Secondary | ICD-10-CM | POA: Diagnosis not present

## 2016-01-20 DIAGNOSIS — Z0183 Encounter for blood typing: Secondary | ICD-10-CM | POA: Diagnosis not present

## 2016-01-20 DIAGNOSIS — M1712 Unilateral primary osteoarthritis, left knee: Secondary | ICD-10-CM | POA: Diagnosis not present

## 2016-01-20 DIAGNOSIS — L814 Other melanin hyperpigmentation: Secondary | ICD-10-CM | POA: Diagnosis not present

## 2016-01-20 DIAGNOSIS — D1801 Hemangioma of skin and subcutaneous tissue: Secondary | ICD-10-CM | POA: Diagnosis not present

## 2016-01-20 DIAGNOSIS — D235 Other benign neoplasm of skin of trunk: Secondary | ICD-10-CM | POA: Diagnosis not present

## 2016-01-20 LAB — CBC WITH DIFFERENTIAL/PLATELET
Basophils Absolute: 0 10*3/uL (ref 0.0–0.1)
Basophils Relative: 0 %
Eosinophils Absolute: 0 10*3/uL (ref 0.0–0.7)
Eosinophils Relative: 1 %
HEMATOCRIT: 39.5 % (ref 36.0–46.0)
HEMOGLOBIN: 13.3 g/dL (ref 12.0–15.0)
LYMPHS PCT: 32 %
Lymphs Abs: 1.8 10*3/uL (ref 0.7–4.0)
MCH: 29.6 pg (ref 26.0–34.0)
MCHC: 33.7 g/dL (ref 30.0–36.0)
MCV: 88 fL (ref 78.0–100.0)
MONO ABS: 0.3 10*3/uL (ref 0.1–1.0)
Monocytes Relative: 5 %
NEUTROS ABS: 3.5 10*3/uL (ref 1.7–7.7)
Neutrophils Relative %: 62 %
Platelets: 268 10*3/uL (ref 150–400)
RBC: 4.49 MIL/uL (ref 3.87–5.11)
RDW: 13.2 % (ref 11.5–15.5)
WBC: 5.6 10*3/uL (ref 4.0–10.5)

## 2016-01-20 LAB — TYPE AND SCREEN
ABO/RH(D): B POS
Antibody Screen: NEGATIVE

## 2016-01-20 LAB — COMPREHENSIVE METABOLIC PANEL
ALT: 23 U/L (ref 14–54)
AST: 21 U/L (ref 15–41)
Albumin: 4.1 g/dL (ref 3.5–5.0)
Alkaline Phosphatase: 53 U/L (ref 38–126)
Anion gap: 10 (ref 5–15)
BILIRUBIN TOTAL: 0.6 mg/dL (ref 0.3–1.2)
BUN: 15 mg/dL (ref 6–20)
CO2: 24 mmol/L (ref 22–32)
CREATININE: 0.73 mg/dL (ref 0.44–1.00)
Calcium: 10.1 mg/dL (ref 8.9–10.3)
Chloride: 107 mmol/L (ref 101–111)
GFR calc Af Amer: >60 mL/min (ref >60)
Glucose, Bld: 227 mg/dL — ABNORMAL HIGH (ref 65–99)
Potassium: 4.1 mmol/L (ref 3.5–5.1)
Sodium: 141 mmol/L (ref 135–145)
TOTAL PROTEIN: 7.3 g/dL (ref 6.5–8.1)

## 2016-01-20 LAB — URINALYSIS, ROUTINE W REFLEX MICROSCOPIC
BILIRUBIN URINE: NEGATIVE
GLUCOSE, UA: 150 mg/dL — AB
HGB URINE DIPSTICK: NEGATIVE
Ketones, ur: NEGATIVE mg/dL
NITRITE: NEGATIVE
Protein, ur: NEGATIVE mg/dL
SPECIFIC GRAVITY, URINE: 1.013 (ref 1.005–1.030)
pH: 6 (ref 5.0–8.0)

## 2016-01-20 LAB — APTT: APTT: 28 s (ref 24–36)

## 2016-01-20 LAB — PROTIME-INR
INR: 0.98
PROTHROMBIN TIME: 13 s (ref 11.4–15.2)

## 2016-01-20 LAB — SURGICAL PCR SCREEN
MRSA, PCR: NEGATIVE
Staphylococcus aureus: NEGATIVE

## 2016-01-20 LAB — GLUCOSE, CAPILLARY: Glucose-Capillary: 236 mg/dL — ABNORMAL HIGH (ref 65–99)

## 2016-01-20 NOTE — Progress Notes (Signed)
PCP is Dr. Edilia Bo  LOV 10/2015 Had stress test "many, many yrs ago"  Denies any cardiac issues currently, hasn't seen a cardio since. Blood sugars in the mornings range from 100-134.

## 2016-01-20 NOTE — Pre-Procedure Instructions (Signed)
Wanda [name]  01/20/2016      [facility] [place], [address] [place] [address] Phone: [phone] Fax: [phone]  [facility] [place], [place] - [address] AT [place] [address] [place] [address] Phone: [phone] Fax: [phone]    Your procedure is scheduled on January 19th, Friday.   Report to [facility] Admitting at 5:30 AM             (posted surgery time 7:30 - 10:00 am)   Call this number if you have problems the Morning of Surgery:  [phone]   Remember:  Do not eat food or drink liquids after midnight Thursday.   Take these medicines the morning of surgery with A SIP OF WATER : Nothing              4-5 days prior to surgery, STOP TAKING any Vitamins, Herbal Supplements, Anti-inflammatories   Do not wear jewelry, make-up or nail polish.  Do not wear lotions, powders, perfumes, or deoderant.   Do not shave underarms & legs 48 hours prior to surgery.    Do not bring valuables to the hospital.  [facility] is not responsible for any belongings or valuables.  Contacts, dentures or bridgework may not be worn into surgery.  Leave your suitcase in the car.  After surgery it may be brought to your room.  For patients admitted to the hospital, discharge time will be determined by your treatment team.  Please read over the following fact sheets that you were given. Pain Booklet, MRSA Information and Surgical Site Infection Prevention                 How to Manage Your Diabetes Before and After Surgery  Why is it important to control my blood sugar before and after surgery? . Improving blood sugar levels before and after surgery helps healing and can limit problems. . A way of improving blood sugar control is eating a healthy diet by: o  Eating less sugar and carbohydrates o  Increasing activity/exercise o  Talking  with your doctor about reaching your blood sugar goals . High blood sugars (greater than 180 mg/dL) can raise your risk of infections and slow your recovery, so you will need to focus on controlling your diabetes during the weeks before surgery. . Make sure that the doctor who takes care of your diabetes knows about your planned surgery including the date and location.  How do I manage my blood sugar before surgery? . Check your blood sugar at least 4 times a day, starting 2 days before surgery, to make sure that the level is not too high or low. o Check your blood sugar the morning of your surgery when you wake up and every 2 hours until you get to the Short Stay unit. o  . If your blood sugar is less than 70 mg/dL, you will need to treat for low blood sugar: .  o Do not take insulin. o  o Treat a low blood sugar (less than 70 mg/dL) with  cup of clear juice (cranberry or apple), 4 glucose tablets, OR glucose gel. o  o Recheck blood sugar in 15 minutes after treatment (to make sure it is greater than 70 mg/dL). If your blood sugar is not greater than 70 mg/dL on recheck, call [phone] for further instructions. Marland Kitchen  Report your blood sugar to the short stay nurse when you get to Short Stay.  . If you are admitted to the hospital after surgery: o Your blood sugar will be checked by the staff and you will probably be given insulin after surgery (instead of oral diabetes medicines) to make sure you have good blood sugar levels. o The goal for blood sugar control after surgery is 80-180 mg/dL.    WHAT DO I DO ABOUT MY DIABETES MEDICATION?   Marland Kitchen Do not take oral diabetes medicines (pills) the morning of surgery.  . THE NIGHT BEFORE SURGERY, take 7 units of Lantus insulin.      . The day of surgery, do not take other diabetes injectables, including Byetta (exenatide), Bydureon (exenatide ER), Victoza (liraglutide), or Trulicity (dulaglutide).  . If your CBG is greater than 220 mg/dL, you  may take  of your sliding scale (correction) dose of insulin.  Other Instructions:          Patient Signature:  Date:   Nurse Signature:  Date:   Reviewed and Endorsed by [facility] Patient Education Committee, August 2015

## 2016-01-21 DIAGNOSIS — D3132 Benign neoplasm of left choroid: Secondary | ICD-10-CM | POA: Diagnosis not present

## 2016-01-21 DIAGNOSIS — E113293 Type 2 diabetes mellitus with mild nonproliferative diabetic retinopathy without macular edema, bilateral: Secondary | ICD-10-CM | POA: Diagnosis not present

## 2016-01-21 DIAGNOSIS — D2312 Other benign neoplasm of skin of left eyelid, including canthus: Secondary | ICD-10-CM | POA: Diagnosis not present

## 2016-01-21 DIAGNOSIS — H40013 Open angle with borderline findings, low risk, bilateral: Secondary | ICD-10-CM | POA: Diagnosis not present

## 2016-01-21 LAB — HEMOGLOBIN A1C
Hgb A1c MFr Bld: 8.8 % — ABNORMAL HIGH (ref 4.8–5.6)
Mean Plasma Glucose: 206 mg/dL

## 2016-01-25 ENCOUNTER — Telehealth: Payer: Self-pay | Admitting: Family Medicine

## 2016-01-25 NOTE — Telephone Encounter (Signed)
-----   Message from Emi Holes, [place] sent at 01/25/2016 10:26 AM EST ----- Regarding: Upcoming Ortho Surgery 01/28/16 Received a call from [place] (Dr. Berenice Primas PA) at [facility]. Per Clair Gulling pt has significant insulin dependence. Had Pre-Op labs drawn and came back with A1C 8.8%. Pt is scheduled for surgery this Friday 01/28/16. Clair Gulling would like to know if there's something that can be done to make pt's sugar better preoperatively. Clair Gulling states that pt can still have surgery and have sugar monitored in the hospital but did want to make provider aware discuss making pt's sugar better before surgery if possible.  Clair Gulling would like a call or text back on his cell. The number is [phone]. Please advise.

## 2016-01-25 NOTE — Telephone Encounter (Signed)
Called and c/w Clair Gulling- her glucose has been hard to control, her A1c is generally around 8%. 12% in 2015.  Our plan as of November was to continue current regimen, work hard on diet and exercise and recheck in 3 months.  Consider trulicity, farxiga if still high. However her current A1c of 8.8 is not out of the ordinary for her unfortunately so we do not expect it will cause unexpected complications of her operation

## 2016-01-27 ENCOUNTER — Encounter (HOSPITAL_COMMUNITY): Payer: Self-pay | Admitting: Anesthesiology

## 2016-01-27 NOTE — Anesthesia Preprocedure Evaluation (Addendum)
Anesthesia Evaluation  Patient identified by MRN, date of birth, ID band Patient awake    Reviewed: Allergy & Precautions, NPO status , Patient's Chart, lab work & pertinent test results  Airway Mallampati: I  TM Distance: >3 FB Neck ROM: Full    Dental  (+) Teeth Intact, Dental Advisory Given   Pulmonary neg pulmonary ROS,    breath sounds clear to auscultation       Cardiovascular hypertension,  Rhythm:Regular Rate:Normal     Neuro/Psych negative neurological ROS  negative psych ROS   GI/Hepatic negative GI ROS, Neg liver ROS,   Endo/Other  diabetes, Type 2, Insulin Dependent  Renal/GU negative Renal ROS  negative genitourinary   Musculoskeletal  (+) Arthritis , Osteoarthritis,    Abdominal   Peds negative pediatric ROS (+)  Hematology negative hematology ROS (+)   Anesthesia Other Findings   Reproductive/Obstetrics negative OB ROS                           Lab Results  Component Value Date   WBC 5.6 01/20/2016   HGB 13.3 01/20/2016   HCT 39.5 01/20/2016   MCV 88.0 01/20/2016   PLT 268 01/20/2016   Lab Results  Component Value Date   CREATININE 0.73 01/20/2016   BUN 15 01/20/2016   NA 141 01/20/2016   K 4.1 01/20/2016   CL 107 01/20/2016   CO2 24 01/20/2016   Lab Results  Component Value Date   INR 0.98 01/20/2016   INR 0.96 09/24/2015   8/17 EKG: NSR  Anesthesia Physical Anesthesia Plan  ASA: II  Anesthesia Plan: Spinal   Post-op Pain Management:  Regional for Post-op pain   Induction: Intravenous  Airway Management Planned: Natural Airway  Additional Equipment:   Intra-op Plan:   Post-operative Plan:   Informed Consent: I have reviewed the patients History and Physical, chart, labs and discussed the procedure including the risks, benefits and alternatives for the proposed anesthesia with the patient or authorized representative who has indicated his/her  understanding and acceptance.     Plan Discussed with: CRNA  Anesthesia Plan Comments: (Confirm Eliquis stop date (3 days).)       Anesthesia Quick Evaluation

## 2016-01-28 ENCOUNTER — Inpatient Hospital Stay (HOSPITAL_COMMUNITY)
Admission: RE | Admit: 2016-01-28 | Discharge: 2016-01-31 | DRG: 470 | Disposition: A | Discharge status: Skilled Nursing Facility | Payer: Medicare Other | Source: Ambulatory Visit | Attending: Orthopedic Surgery | Admitting: Orthopedic Surgery

## 2016-01-28 ENCOUNTER — Encounter (HOSPITAL_COMMUNITY): Payer: Self-pay | Admitting: Certified Registered Nurse Anesthetist

## 2016-01-28 ENCOUNTER — Encounter (HOSPITAL_COMMUNITY)
Admission: RE | Disposition: A | Discharge status: Skilled Nursing Facility | Payer: Self-pay | Source: Ambulatory Visit | Attending: Orthopedic Surgery

## 2016-01-28 ENCOUNTER — Inpatient Hospital Stay (HOSPITAL_COMMUNITY): Payer: Medicare Other | Admitting: Anesthesiology

## 2016-01-28 DIAGNOSIS — Z79899 Other long term (current) drug therapy: Secondary | ICD-10-CM | POA: Diagnosis not present

## 2016-01-28 DIAGNOSIS — M1712 Unilateral primary osteoarthritis, left knee: Secondary | ICD-10-CM | POA: Diagnosis present

## 2016-01-28 DIAGNOSIS — Z886 Allergy status to analgesic agent status: Secondary | ICD-10-CM

## 2016-01-28 DIAGNOSIS — E785 Hyperlipidemia, unspecified: Secondary | ICD-10-CM | POA: Diagnosis present

## 2016-01-28 DIAGNOSIS — I10 Essential (primary) hypertension: Secondary | ICD-10-CM | POA: Diagnosis present

## 2016-01-28 DIAGNOSIS — E119 Type 2 diabetes mellitus without complications: Secondary | ICD-10-CM | POA: Diagnosis present

## 2016-01-28 DIAGNOSIS — E1165 Type 2 diabetes mellitus with hyperglycemia: Secondary | ICD-10-CM | POA: Diagnosis present

## 2016-01-28 DIAGNOSIS — M25569 Pain in unspecified knee: Secondary | ICD-10-CM | POA: Diagnosis not present

## 2016-01-28 DIAGNOSIS — Z96651 Presence of right artificial knee joint: Secondary | ICD-10-CM | POA: Diagnosis present

## 2016-01-28 DIAGNOSIS — Z888 Allergy status to other drugs, medicaments and biological substances status: Secondary | ICD-10-CM | POA: Diagnosis not present

## 2016-01-28 DIAGNOSIS — Z794 Long term (current) use of insulin: Secondary | ICD-10-CM

## 2016-01-28 DIAGNOSIS — Z90710 Acquired absence of both cervix and uterus: Secondary | ICD-10-CM | POA: Diagnosis not present

## 2016-01-28 DIAGNOSIS — Z7901 Long term (current) use of anticoagulants: Secondary | ICD-10-CM | POA: Diagnosis not present

## 2016-01-28 DIAGNOSIS — R531 Weakness: Secondary | ICD-10-CM | POA: Diagnosis not present

## 2016-01-28 DIAGNOSIS — E1169 Type 2 diabetes mellitus with other specified complication: Secondary | ICD-10-CM | POA: Diagnosis present

## 2016-01-28 DIAGNOSIS — M810 Age-related osteoporosis without current pathological fracture: Secondary | ICD-10-CM | POA: Diagnosis present

## 2016-01-28 DIAGNOSIS — Z85828 Personal history of other malignant neoplasm of skin: Secondary | ICD-10-CM

## 2016-01-28 DIAGNOSIS — N3941 Urge incontinence: Secondary | ICD-10-CM | POA: Diagnosis not present

## 2016-01-28 DIAGNOSIS — E669 Obesity, unspecified: Secondary | ICD-10-CM | POA: Diagnosis not present

## 2016-01-28 DIAGNOSIS — E11319 Type 2 diabetes mellitus with unspecified diabetic retinopathy without macular edema: Secondary | ICD-10-CM | POA: Diagnosis present

## 2016-01-28 DIAGNOSIS — Z96652 Presence of left artificial knee joint: Secondary | ICD-10-CM | POA: Diagnosis not present

## 2016-01-28 DIAGNOSIS — H269 Unspecified cataract: Secondary | ICD-10-CM | POA: Diagnosis not present

## 2016-01-28 DIAGNOSIS — Z471 Aftercare following joint replacement surgery: Secondary | ICD-10-CM | POA: Diagnosis not present

## 2016-01-28 DIAGNOSIS — M25562 Pain in left knee: Secondary | ICD-10-CM | POA: Diagnosis not present

## 2016-01-28 DIAGNOSIS — E042 Nontoxic multinodular goiter: Secondary | ICD-10-CM | POA: Diagnosis not present

## 2016-01-28 DIAGNOSIS — G8918 Other acute postprocedural pain: Secondary | ICD-10-CM | POA: Diagnosis not present

## 2016-01-28 HISTORY — PX: TOTAL KNEE ARTHROPLASTY: SHX125

## 2016-01-28 LAB — GLUCOSE, CAPILLARY
GLUCOSE-CAPILLARY: 142 mg/dL — AB (ref 65–99)
GLUCOSE-CAPILLARY: 173 mg/dL — AB (ref 65–99)
GLUCOSE-CAPILLARY: 188 mg/dL — AB (ref 65–99)
Glucose-Capillary: 129 mg/dL — ABNORMAL HIGH (ref 65–99)

## 2016-01-28 SURGERY — ARTHROPLASTY, KNEE, TOTAL
Anesthesia: Spinal | Laterality: Left

## 2016-01-28 MED ORDER — BUPIVACAINE HCL (PF) 0.5 % IJ SOLN
INTRAMUSCULAR | Status: DC | PRN
  Administered 2016-01-28: 20 mL

## 2016-01-28 MED ORDER — APIXABAN 2.5 MG PO TABS: 2.5000 mg | ORAL_TABLET | Freq: Two times a day (BID) | ORAL | 0 refills | Status: DC

## 2016-01-28 MED ORDER — BISACODYL 5 MG PO TBEC: 5.0000 mg | DELAYED_RELEASE_TABLET | Freq: Every day | ORAL | Status: DC | PRN

## 2016-01-28 MED ORDER — GABAPENTIN 300 MG PO CAPS
300.0000 mg | ORAL_CAPSULE | Freq: Two times a day (BID) | ORAL | Status: DC
  Administered 2016-01-28 – 2016-01-31 (×6): 300 mg via ORAL
  Filled 2016-01-28 (×6): qty 1

## 2016-01-28 MED ORDER — TRANEXAMIC ACID 1000 MG/10ML IV SOLN
1000.0000 mg | Freq: Once | INTRAVENOUS | Status: AC
  Administered 2016-01-28: 1000 mg via INTRAVENOUS
  Filled 2016-01-28: qty 10

## 2016-01-28 MED ORDER — CHLORHEXIDINE GLUCONATE 4 % EX LIQD: 60.0000 mL | Freq: Once | CUTANEOUS | Status: DC

## 2016-01-28 MED ORDER — HYDROMORPHONE HCL 1 MG/ML IJ SOLN
0.2500 mg | INTRAMUSCULAR | Status: DC | PRN
  Administered 2016-01-28 (×2): 0.5 mg via INTRAVENOUS

## 2016-01-28 MED ORDER — ALUM & MAG HYDROXIDE-SIMETH 200-200-20 MG/5ML PO SUSP: 30.0000 mL | ORAL | Status: DC | PRN

## 2016-01-28 MED ORDER — CEFUROXIME SODIUM 750 MG IJ SOLR
INTRAMUSCULAR | Status: AC
  Filled 2016-01-28: qty 750

## 2016-01-28 MED ORDER — ROPIVACAINE HCL 7.5 MG/ML IJ SOLN
INTRAMUSCULAR | Status: DC | PRN
  Administered 2016-01-28: 20 mL via PERINEURAL

## 2016-01-28 MED ORDER — ONDANSETRON HCL 4 MG/2ML IJ SOLN
4.0000 mg | Freq: Four times a day (QID) | INTRAMUSCULAR | Status: DC | PRN
  Filled 2016-01-28: qty 2

## 2016-01-28 MED ORDER — TRANEXAMIC ACID 1000 MG/10ML IV SOLN: 1000.0000 mg | INTRAVENOUS | Status: DC

## 2016-01-28 MED ORDER — BUPIVACAINE LIPOSOME 1.3 % IJ SUSP
INTRAMUSCULAR | Status: DC | PRN
  Administered 2016-01-28: 20 mL

## 2016-01-28 MED ORDER — HYDROMORPHONE HCL 1 MG/ML IJ SOLN
INTRAMUSCULAR | Status: AC
  Filled 2016-01-28: qty 0.5

## 2016-01-28 MED ORDER — METHOCARBAMOL 500 MG PO TABS
500.0000 mg | ORAL_TABLET | Freq: Four times a day (QID) | ORAL | Status: DC | PRN
  Administered 2016-01-29 – 2016-01-30 (×4): 500 mg via ORAL
  Filled 2016-01-28 (×5): qty 1

## 2016-01-28 MED ORDER — PHENYLEPHRINE 40 MCG/ML (10ML) SYRINGE FOR IV PUSH (FOR BLOOD PRESSURE SUPPORT)
PREFILLED_SYRINGE | INTRAVENOUS | Status: AC
  Filled 2016-01-28: qty 10

## 2016-01-28 MED ORDER — ONDANSETRON HCL 4 MG PO TABS: 4.0000 mg | ORAL_TABLET | Freq: Four times a day (QID) | ORAL | Status: DC | PRN

## 2016-01-28 MED ORDER — POLYETHYLENE GLYCOL 3350 17 G PO PACK: 17.0000 g | PACK | Freq: Every day | ORAL | Status: DC | PRN

## 2016-01-28 MED ORDER — FENTANYL CITRATE (PF) 100 MCG/2ML IJ SOLN
INTRAMUSCULAR | Status: AC
  Filled 2016-01-28: qty 2

## 2016-01-28 MED ORDER — DOCUSATE SODIUM 100 MG PO CAPS: 100.0000 mg | ORAL_CAPSULE | Freq: Two times a day (BID) | ORAL | 0 refills | Status: DC

## 2016-01-28 MED ORDER — ZOLPIDEM TARTRATE 5 MG PO TABS
5.0000 mg | ORAL_TABLET | Freq: Every evening | ORAL | Status: DC | PRN
Start: 2016-01-28 — End: 2016-01-31

## 2016-01-28 MED ORDER — 0.9 % SODIUM CHLORIDE (POUR BTL) OPTIME
TOPICAL | Status: DC | PRN
  Administered 2016-01-28: 1000 mL

## 2016-01-28 MED ORDER — FENTANYL CITRATE (PF) 100 MCG/2ML IJ SOLN
INTRAMUSCULAR | Status: DC | PRN
  Administered 2016-01-28 (×2): 50 ug via INTRAVENOUS

## 2016-01-28 MED ORDER — BUPIVACAINE LIPOSOME 1.3 % IJ SUSP
20.0000 mL | INTRAMUSCULAR | Status: DC
  Filled 2016-01-28 (×2): qty 20

## 2016-01-28 MED ORDER — BUPIVACAINE HCL (PF) 0.5 % IJ SOLN
INTRAMUSCULAR | Status: AC
  Filled 2016-01-28: qty 30

## 2016-01-28 MED ORDER — DIPHENHYDRAMINE HCL 12.5 MG/5ML PO ELIX: 12.5000 mg | ORAL_SOLUTION | ORAL | Status: DC | PRN

## 2016-01-28 MED ORDER — CEFAZOLIN SODIUM-DEXTROSE 2-4 GM/100ML-% IV SOLN
2.0000 g | INTRAVENOUS | Status: AC
  Administered 2016-01-28: 2 g via INTRAVENOUS
  Filled 2016-01-28: qty 100

## 2016-01-28 MED ORDER — MEPERIDINE HCL 25 MG/ML IJ SOLN: 6.2500 mg | INTRAMUSCULAR | Status: DC | PRN

## 2016-01-28 MED ORDER — ONDANSETRON HCL 4 MG/2ML IJ SOLN
INTRAMUSCULAR | Status: AC
  Filled 2016-01-28: qty 2

## 2016-01-28 MED ORDER — METHOCARBAMOL 1000 MG/10ML IJ SOLN
500.0000 mg | Freq: Four times a day (QID) | INTRAVENOUS | Status: DC | PRN
  Administered 2016-01-28: 500 mg via INTRAVENOUS
  Filled 2016-01-28 (×2): qty 5

## 2016-01-28 MED ORDER — ACETAMINOPHEN 650 MG RE SUPP: 650.0000 mg | Freq: Four times a day (QID) | RECTAL | Status: DC | PRN

## 2016-01-28 MED ORDER — OXYCODONE HCL 5 MG PO TABS
5.0000 mg | ORAL_TABLET | ORAL | Status: DC | PRN
  Administered 2016-01-28 – 2016-01-30 (×7): 10 mg via ORAL
  Administered 2016-01-30: 5 mg via ORAL
  Administered 2016-01-30: 10 mg via ORAL
  Administered 2016-01-31 (×3): 5 mg via ORAL
  Filled 2016-01-28 (×2): qty 1
  Filled 2016-01-28 (×2): qty 2
  Filled 2016-01-28 (×2): qty 1
  Filled 2016-01-28 (×6): qty 2

## 2016-01-28 MED ORDER — TIZANIDINE HCL 2 MG PO TABS: 2.0000 mg | ORAL_TABLET | Freq: Three times a day (TID) | ORAL | 0 refills | Status: DC | PRN

## 2016-01-28 MED ORDER — LACTATED RINGERS IV SOLN
INTRAVENOUS | Status: DC | PRN
  Administered 2016-01-28 (×2): via INTRAVENOUS

## 2016-01-28 MED ORDER — PROMETHAZINE HCL 25 MG/ML IJ SOLN: 6.2500 mg | INTRAMUSCULAR | Status: DC | PRN

## 2016-01-28 MED ORDER — PHENYLEPHRINE 40 MCG/ML (10ML) SYRINGE FOR IV PUSH (FOR BLOOD PRESSURE SUPPORT)
PREFILLED_SYRINGE | INTRAVENOUS | Status: DC | PRN
  Administered 2016-01-28 (×5): 40 ug via INTRAVENOUS
  Administered 2016-01-28: 80 ug via INTRAVENOUS
  Administered 2016-01-28: 40 ug via INTRAVENOUS

## 2016-01-28 MED ORDER — INSULIN ASPART 100 UNIT/ML ~~LOC~~ SOLN
0.0000 [IU] | Freq: Three times a day (TID) | SUBCUTANEOUS | Status: DC
  Administered 2016-01-28: 3 [IU] via SUBCUTANEOUS
  Administered 2016-01-29: 8 [IU] via SUBCUTANEOUS
  Administered 2016-01-29: 5 [IU] via SUBCUTANEOUS
  Administered 2016-01-30: 3 [IU] via SUBCUTANEOUS
  Administered 2016-01-30: 5 [IU] via SUBCUTANEOUS
  Administered 2016-01-30: 3 [IU] via SUBCUTANEOUS
  Administered 2016-01-31: 5 [IU] via SUBCUTANEOUS
  Administered 2016-01-31: 3 [IU] via SUBCUTANEOUS

## 2016-01-28 MED ORDER — SODIUM CHLORIDE 0.9 % IJ SOLN
INTRAMUSCULAR | Status: DC | PRN
  Administered 2016-01-28: 20 mL via INTRAVENOUS

## 2016-01-28 MED ORDER — ONDANSETRON HCL 4 MG/2ML IJ SOLN
INTRAMUSCULAR | Status: DC | PRN
  Administered 2016-01-28: 4 mg via INTRAVENOUS

## 2016-01-28 MED ORDER — LIDOCAINE 2% (20 MG/ML) 5 ML SYRINGE
INTRAMUSCULAR | Status: AC
  Filled 2016-01-28: qty 5

## 2016-01-28 MED ORDER — CEFUROXIME SODIUM 750 MG IJ SOLR
INTRAMUSCULAR | Status: DC | PRN
  Administered 2016-01-28: 750 mg

## 2016-01-28 MED ORDER — SODIUM CHLORIDE 0.9 % IV SOLN
INTRAVENOUS | Status: DC
  Administered 2016-01-29: 22:00:00 via INTRAVENOUS

## 2016-01-28 MED ORDER — BUPIVACAINE IN DEXTROSE 0.75-8.25 % IT SOLN
INTRATHECAL | Status: DC | PRN
  Administered 2016-01-28: 2 mL via INTRATHECAL

## 2016-01-28 MED ORDER — OXYCODONE-ACETAMINOPHEN 5-325 MG PO TABS: 1.0000 | ORAL_TABLET | Freq: Four times a day (QID) | ORAL | 0 refills | Status: DC | PRN

## 2016-01-28 MED ORDER — HYDROMORPHONE HCL 2 MG/ML IJ SOLN: 0.5000 mg | INTRAMUSCULAR | Status: DC | PRN

## 2016-01-28 MED ORDER — ACETAMINOPHEN 325 MG PO TABS: 650.0000 mg | ORAL_TABLET | Freq: Four times a day (QID) | ORAL | Status: DC | PRN

## 2016-01-28 MED ORDER — INSULIN GLARGINE 100 UNIT/ML ~~LOC~~ SOLN
15.0000 [IU] | Freq: Every day | SUBCUTANEOUS | Status: DC
Start: 2016-01-28 — End: 2016-01-31
  Administered 2016-01-28 – 2016-01-30 (×3): 15 [IU] via SUBCUTANEOUS
  Filled 2016-01-28 (×4): qty 0.15

## 2016-01-28 MED ORDER — PROPOFOL 500 MG/50ML IV EMUL
INTRAVENOUS | Status: DC | PRN
  Administered 2016-01-28: 100 ug/kg/min via INTRAVENOUS

## 2016-01-28 MED ORDER — PROPOFOL 10 MG/ML IV BOLUS
INTRAVENOUS | Status: AC
  Filled 2016-01-28: qty 20

## 2016-01-28 MED ORDER — APIXABAN 2.5 MG PO TABS
2.5000 mg | ORAL_TABLET | Freq: Two times a day (BID) | ORAL | Status: DC
  Administered 2016-01-29 – 2016-01-31 (×5): 2.5 mg via ORAL
  Filled 2016-01-28 (×5): qty 1

## 2016-01-28 MED ORDER — LACTATED RINGERS IV SOLN: INTRAVENOUS | Status: DC

## 2016-01-28 MED ORDER — MAGNESIUM CITRATE PO SOLN: 1.0000 | Freq: Once | ORAL | Status: DC | PRN

## 2016-01-28 MED ORDER — INSULIN GLARGINE 100 UNIT/ML SOLOSTAR PEN: 15.0000 [IU] | PEN_INJECTOR | Freq: Every day | SUBCUTANEOUS | Status: DC

## 2016-01-28 MED ORDER — OXYCODONE HCL 5 MG PO TABS
ORAL_TABLET | ORAL | Status: AC
  Filled 2016-01-28: qty 2

## 2016-01-28 MED ORDER — TRANEXAMIC ACID 1000 MG/10ML IV SOLN
1000.0000 mg | INTRAVENOUS | Status: AC
  Administered 2016-01-28: 1000 mg via INTRAVENOUS
  Filled 2016-01-28: qty 10

## 2016-01-28 MED ORDER — HYDROMORPHONE HCL 1 MG/ML IJ SOLN: 0.5000 mg | INTRAMUSCULAR | Status: DC | PRN

## 2016-01-28 MED ORDER — PROMETHAZINE HCL 25 MG/ML IJ SOLN: 6.2500 mg | Freq: Four times a day (QID) | INTRAMUSCULAR | Status: DC | PRN

## 2016-01-28 MED ORDER — CEFAZOLIN SODIUM-DEXTROSE 2-4 GM/100ML-% IV SOLN
2.0000 g | Freq: Four times a day (QID) | INTRAVENOUS | Status: AC
  Administered 2016-01-28 – 2016-01-29 (×4): 2 g via INTRAVENOUS
  Filled 2016-01-28 (×4): qty 100

## 2016-01-28 MED ORDER — DOCUSATE SODIUM 100 MG PO CAPS
100.0000 mg | ORAL_CAPSULE | Freq: Two times a day (BID) | ORAL | Status: DC
  Administered 2016-01-28 – 2016-01-31 (×6): 100 mg via ORAL
  Filled 2016-01-28 (×7): qty 1

## 2016-01-28 SURGICAL SUPPLY — 60 items
BANDAGE ACE 6X5 VEL STRL LF (GAUZE/BANDAGES/DRESSINGS) ×3 IMPLANT
BANDAGE ESMARK 6X9 LF (GAUZE/BANDAGES/DRESSINGS) ×1 IMPLANT
BENZOIN TINCTURE PRP APPL 2/3 (GAUZE/BANDAGES/DRESSINGS) ×3 IMPLANT
BLADE SAGITTAL 25.0X1.19X90 (BLADE) ×2 IMPLANT
BLADE SAGITTAL 25.0X1.19X90MM (BLADE) ×1
BLADE SAW SAG 90X13X1.27 (BLADE) ×3 IMPLANT
BNDG ESMARK 6X9 LF (GAUZE/BANDAGES/DRESSINGS) ×3
BOWL SMART MIX CTS (DISPOSABLE) ×3 IMPLANT
CAPT KNEE TOTAL 3 ATTUNE ×3 IMPLANT
CEMENT HV SMART SET (Cement) ×6 IMPLANT
CLOSURE WOUND 1/2 X4 (GAUZE/BANDAGES/DRESSINGS) ×1
COVER SURGICAL LIGHT HANDLE (MISCELLANEOUS) ×3 IMPLANT
CUFF TOURNIQUET SINGLE 34IN LL (TOURNIQUET CUFF) ×3 IMPLANT
CUFF TOURNIQUET SINGLE 44IN (TOURNIQUET CUFF) IMPLANT
DRAPE EXTREMITY T 121X128X90 (DRAPE) ×3 IMPLANT
DRAPE U-SHAPE 47X51 STRL (DRAPES) ×3 IMPLANT
DRESSING AQUACEL AG SP 3.5X10 (GAUZE/BANDAGES/DRESSINGS) ×1 IMPLANT
DRSG AQUACEL AG ADV 3.5X10 (GAUZE/BANDAGES/DRESSINGS) ×3 IMPLANT
DRSG AQUACEL AG SP 3.5X10 (GAUZE/BANDAGES/DRESSINGS) ×3
DRSG PAD ABDOMINAL 8X10 ST (GAUZE/BANDAGES/DRESSINGS) ×6 IMPLANT
DURAPREP 26ML APPLICATOR (WOUND CARE) ×3 IMPLANT
ELECT REM PT RETURN 9FT ADLT (ELECTROSURGICAL) ×3
ELECTRODE REM PT RTRN 9FT ADLT (ELECTROSURGICAL) ×1 IMPLANT
EVACUATOR 1/8 PVC DRAIN (DRAIN) IMPLANT
FACESHIELD WRAPAROUND (MASK) ×3 IMPLANT
GAUZE SPONGE 4X4 12PLY STRL (GAUZE/BANDAGES/DRESSINGS) ×3 IMPLANT
GLOVE BIO SURGEON STRL SZ7 (GLOVE) ×3 IMPLANT
GLOVE BIOGEL PI IND STRL 8 (GLOVE) ×3 IMPLANT
GLOVE BIOGEL PI INDICATOR 8 (GLOVE) ×6
GLOVE ECLIPSE 7.5 STRL STRAW (GLOVE) ×6 IMPLANT
GOWN STRL REUS W/ TWL LRG LVL3 (GOWN DISPOSABLE) ×1 IMPLANT
GOWN STRL REUS W/ TWL XL LVL3 (GOWN DISPOSABLE) ×2 IMPLANT
GOWN STRL REUS W/TWL LRG LVL3 (GOWN DISPOSABLE) ×2
GOWN STRL REUS W/TWL XL LVL3 (GOWN DISPOSABLE) ×4
HANDPIECE INTERPULSE COAX TIP (DISPOSABLE) ×2
HOOD PEEL AWAY FACE SHEILD DIS (HOOD) ×3 IMPLANT
IMMOBILIZER KNEE 22 UNIV (SOFTGOODS) ×3 IMPLANT
KIT BASIN OR (CUSTOM PROCEDURE TRAY) ×3 IMPLANT
KIT ROOM TURNOVER OR (KITS) ×3 IMPLANT
MANIFOLD NEPTUNE II (INSTRUMENTS) ×3 IMPLANT
NEEDLE 22X1 1/2 (OR ONLY) (NEEDLE) ×3 IMPLANT
NS IRRIG 1000ML POUR BTL (IV SOLUTION) ×3 IMPLANT
PACK TOTAL JOINT (CUSTOM PROCEDURE TRAY) ×3 IMPLANT
PAD ARMBOARD 7.5X6 YLW CONV (MISCELLANEOUS) ×6 IMPLANT
SET HNDPC FAN SPRY TIP SCT (DISPOSABLE) ×1 IMPLANT
STAPLER VISISTAT 35W (STAPLE) IMPLANT
STRIP CLOSURE SKIN 1/2X4 (GAUZE/BANDAGES/DRESSINGS) ×2 IMPLANT
SUCTION FRAZIER HANDLE 10FR (MISCELLANEOUS) ×2
SUCTION TUBE FRAZIER 10FR DISP (MISCELLANEOUS) ×1 IMPLANT
SUT MNCRL AB 3-0 PS2 18 (SUTURE) IMPLANT
SUT VIC AB 0 CTB1 27 (SUTURE) ×6 IMPLANT
SUT VIC AB 1 CT1 27 (SUTURE) ×4
SUT VIC AB 1 CT1 27XBRD ANBCTR (SUTURE) ×2 IMPLANT
SUT VIC AB 2-0 CTB1 (SUTURE) ×6 IMPLANT
SYR 50ML LL SCALE MARK (SYRINGE) ×3 IMPLANT
TOWEL OR 17X24 6PK STRL BLUE (TOWEL DISPOSABLE) ×3 IMPLANT
TOWEL OR 17X26 10 PK STRL BLUE (TOWEL DISPOSABLE) ×3 IMPLANT
TRAY CATH 16FR W/PLASTIC CATH (SET/KITS/TRAYS/PACK) IMPLANT
TRAY FOLEY CATH 16FRSI W/METER (SET/KITS/TRAYS/PACK) IMPLANT
WRAP KNEE MAXI GEL POST OP (GAUZE/BANDAGES/DRESSINGS) ×3 IMPLANT

## 2016-01-28 NOTE — H&P (Signed)
TOTAL KNEE ADMISSION H&P  Patient is being admitted for left total knee arthroplasty.  Subjective:  Chief Complaint:left knee pain.  HPI: Wanda [name], 76 y.o. female, has a history of pain and functional disability in the left knee due to arthritis and has failed non-surgical conservative treatments for greater than 12 weeks to includeNSAID's and/or analgesics, corticosteriod injections, viscosupplementation injections, weight reduction as appropriate and activity modification.  Onset of symptoms was gradual, starting 5 years ago with gradually worsening course since that time. The patient noted no past surgery on the left knee(s).  Patient currently rates pain in the left knee(s) at 8 out of 10 with activity. Patient has night pain, worsening of pain with activity and weight bearing, pain that interferes with activities of daily living, pain with passive range of motion and joint swelling.  Patient has evidence of subchondral sclerosis, joint subluxation and joint space narrowing by imaging studies. This patient has had failure of all reasonable conservative care. There is no active infection.The patient is admitted as inpatient because of diabetes and other medical co-morbidity and is expected to stay 2 midnights.  Patient Active Problem List   Diagnosis Date Noted  . Primary osteoarthritis of right knee 10/04/2015  . Osteoporosis 08/23/2015  . Multinodular goiter (nontoxic) 04/07/2014  . Diabetes mellitus type 2, uncontrolled, without complications ([place]) 40/98/1191  . Insulin adverse reaction 09/27/2013  . Senile nuclear sclerosis 08/14/2012  . HTN, goal below 130/80 03/10/2011  . Hyperlipidemia with target LDL less than 70 03/10/2011  . Obesity (BMI 30.0-34.9) 03/10/2011  . Health care maintenance 03/10/2011   Past Medical History:  Diagnosis Date  . Arthritis   . Cataract   . Diabetes mellitus   . Hypertension   . Skin cancer    Removed from face  . Urgency of urination   .  Urinary leakage     Past Surgical History:  Procedure Laterality Date  . ABDOMINAL HYSTERECTOMY  early 80's   total  . CARPAL TUNNEL RELEASE Bilateral 1980 and 1981   both hands  . EYE SURGERY     bilateral cataracts  . Fatty Tumor Excision    . JOINT REPLACEMENT    . TONSILLECTOMY    . TONSILLECTOMY AND ADENOIDECTOMY  age 43  . TOTAL KNEE ARTHROPLASTY Right 10/04/2015  . TOTAL KNEE ARTHROPLASTY Right 10/04/2015   Procedure: TOTAL KNEE ARTHROPLASTY;  Surgeon: Dorna Leitz, MD;  Location: [facility];  Service: Orthopedics;  Laterality: Right;  . TUBAL LIGATION      Prescriptions Prior to Admission  Medication Sig Dispense Refill Last Dose  . acetaminophen (TYLENOL) 325 MG tablet Take 650 mg by mouth every 6 (six) hours as needed for mild pain.   Past Week at Unknown time  . BD PEN NEEDLE NANO U/F 32G X 4 MM MISC USE TO INJECT INSULIN ONCE DAILY 100 each 0 Taking  . Blood Glucose Monitoring Suppl (ONE TOUCH ULTRA MINI) w/Device KIT Use to test blood sugar daily as instructed. Dx: E11.65 1 each 0 Taking  . glucose blood test strip Test blood sugar 3 times a day. Dx code: 250.00 100 each 12 Taking  . Insulin Glargine (LANTUS SOLOSTAR) 100 UNIT/ML Solostar Pen Inject 20 Units into the skin daily at 10 pm. (Patient taking differently: Inject 15 Units into the skin daily at 10 pm. ) 15 mL 5 01/27/2016 at Unknown time  . Insulin Syringe-Needle U-100 (INSULIN SYRINGE .5CC/31GX5/16") 31G X 5/16" 0.5 ML MISC Use to inject insulin 1 time daily.  90 each 3 Taking  . niacin (NIASPAN) 500 MG CR tablet Take 1 tablet (500 mg total) by mouth at bedtime. Increase to 2 tablets after 1 month (Patient taking differently: Take 500 mg by mouth daily. ) 180 tablet 2 Past Week at Unknown time  . vitamin E 200 UNIT capsule Take 200 Units by mouth daily. Reported on 05/31/2015   Past Week at Unknown time  . apixaban (ELIQUIS) 2.5 MG TABS tablet Take 1 tablet (2.5 mg total) by mouth 2 (two) times daily. Take x 3 weeks post op  (Patient not taking: Reported on 01/13/2016) 40 tablet 0 Not Taking at Unknown time  . oxyCODONE-acetaminophen (PERCOCET/ROXICET) 5-325 MG tablet Take 1-2 tablets by mouth every 6 (six) hours as needed for severe pain. (Patient not taking: Reported on 10/25/2015) 60 tablet 0 Not Taking  . tiZANidine (ZANAFLEX) 2 MG tablet Take 1 tablet (2 mg total) by mouth every 8 (eight) hours as needed for muscle spasms. (Patient not taking: Reported on 01/13/2016) 50 tablet 0 Not Taking at Unknown time   Allergies  Allergen Reactions  . Ace Inhibitors Swelling and Cough    Pt had cough and diarrhea with first few doses of medication; also had swelling of right eyelid. Stopped medication on 03/17/11.  . Alendronate Sodium Other (See Comments)    Whole body hurt  . Aspirin Hives  . Glucotrol [Glipizide] Other (See Comments)    Migraine "color flashes" in L eye with dizziness and lightheadedness.  . Lipitor [Atorvastatin Calcium] Other (See Comments)    Whole body hurt  . Metformin And Related     Severe abdominal pain  . Shellfish Allergy     Intolerant of fresh shellfish, reports the reaction is GI upset, denies hives, denies any swelling  Reports that she can tolerate canned seafood.     Social History  Substance Use Topics  . Smoking status: Never Smoker  . Smokeless tobacco: Never Used  . Alcohol use 0.5 oz/week    1 drink(s) per week    Family History  Problem Relation Age of Onset  . Pancreatitis Father     deceased 1  . Cancer    . Cancer Brother     GI  . Cancer Mother     liver  . Cancer Son     terminal kidney     ROS ROS: I have reviewed the patient's review of systems thoroughly and there are no positive responses as relates to the HPI. Objective:  Physical Exam  Vital signs in last 24 hours: Temp:  [98.3 F (36.8 C)] 98.3 F (36.8 C) (01/19 0620) Pulse Rate:  [79] 79 (01/19 0620) Resp:  [20] 20 (01/19 0620) BP: (213)/(78) 213/78 (01/19 0620) SpO2:  [99 %] 99 % (01/19  0620) Well-developed well-nourished patient in no acute distress. Alert and oriented x3 HEENT:within normal limits Cardiac: Regular rate and rhythm Pulmonary: Lungs clear to auscultation Abdomen: Soft and nontender.  Normal active bowel sounds  Musculoskeletal: (l knee varus malalignment and painful rom) Labs: Recent Results (from the past 2160 hour(s))  CBC     Status: None   Collection Time: 11/11/15  2:05 PM  Result Value Ref Range   WBC 6.6 4.0 - 10.5 K/uL   RBC 4.06 3.87 - 5.11 Mil/uL   Platelets 264.0 150.0 - 400.0 K/uL   Hemoglobin 12.2 12.0 - 15.0 g/dL   HCT 36.5 36.0 - 46.0 %   MCV 89.8 78.0 - 100.0 fl   MCHC 33.4  30.0 - 36.0 g/dL   RDW 14.4 11.5 - 15.5 %  Hemoglobin A1C     Status: Abnormal   Collection Time: 11/11/15  2:05 PM  Result Value Ref Range   Hgb A1c MFr Bld 8.1 (H) 4.6 - 6.5 %    Comment: Glycemic Control Guidelines for People with Diabetes:Non Diabetic:  <6%Goal of Therapy: <7%Additional Action Suggested:  >8%   Glucose, capillary     Status: Abnormal   Collection Time: 01/20/16  1:14 PM  Result Value Ref Range   Glucose-Capillary 236 (H) 65 - 99 mg/dL  Surgical pcr screen     Status: None   Collection Time: 01/20/16  1:41 PM  Result Value Ref Range   MRSA, PCR NEGATIVE NEGATIVE   Staphylococcus aureus NEGATIVE NEGATIVE    Comment:        The Xpert SA Assay (FDA approved for NASAL specimens in patients over 40 years of age), is one component of a comprehensive surveillance program.  Test performance has been validated by [facility] for patients greater than or equal to 76 year old. It is not intended to diagnose infection nor to guide or monitor treatment.   Hemoglobin A1c     Status: Abnormal   Collection Time: 01/20/16  1:42 PM  Result Value Ref Range   Hgb A1c MFr Bld 8.8 (H) 4.8 - 5.6 %    Comment: (NOTE)         Pre-diabetes: 5.7 - 6.4         Diabetes: >6.4         Glycemic control for adults with diabetes: <7.0    Mean Plasma  Glucose 206 mg/dL    Comment: (NOTE) Performed At: [facility] [place], [place] [id number] Lindon Romp MD BH:[id number]   Urinalysis, Routine w reflex microscopic     Status: Abnormal   Collection Time: 01/20/16  1:42 PM  Result Value Ref Range   Color, Urine YELLOW YELLOW   APPearance CLEAR CLEAR   Specific Gravity, Urine 1.013 1.005 - 1.030   pH 6.0 5.0 - 8.0   Glucose, UA 150 (A) NEGATIVE mg/dL   Hgb urine dipstick NEGATIVE NEGATIVE   Bilirubin Urine NEGATIVE NEGATIVE   Ketones, ur NEGATIVE NEGATIVE mg/dL   Protein, ur NEGATIVE NEGATIVE mg/dL   Nitrite NEGATIVE NEGATIVE   Leukocytes, UA TRACE (A) NEGATIVE   RBC / HPF 0-5 0 - 5 RBC/hpf   WBC, UA 6-30 0 - 5 WBC/hpf   Bacteria, UA RARE (A) NONE SEEN   Squamous Epithelial / LPF 0-5 (A) NONE SEEN   Mucous PRESENT   APTT     Status: None   Collection Time: 01/20/16  1:43 PM  Result Value Ref Range   aPTT 28 24 - 36 seconds  CBC WITH DIFFERENTIAL     Status: None   Collection Time: 01/20/16  1:43 PM  Result Value Ref Range   WBC 5.6 4.0 - 10.5 K/uL   RBC 4.49 3.87 - 5.11 MIL/uL   Hemoglobin 13.3 12.0 - 15.0 g/dL   HCT 39.5 36.0 - 46.0 %   MCV 88.0 78.0 - 100.0 fL   MCH 29.6 26.0 - 34.0 pg   MCHC 33.7 30.0 - 36.0 g/dL   RDW 13.2 11.5 - 15.5 %   Platelets 268 150 - 400 K/uL   Neutrophils Relative % 62 %   Neutro Abs 3.5 1.7 - 7.7 K/uL   Lymphocytes Relative 32 %   Lymphs Abs  1.8 0.7 - 4.0 K/uL   Monocytes Relative 5 %   Monocytes Absolute 0.3 0.1 - 1.0 K/uL   Eosinophils Relative 1 %   Eosinophils Absolute 0.0 0.0 - 0.7 K/uL   Basophils Relative 0 %   Basophils Absolute 0.0 0.0 - 0.1 K/uL  Comprehensive metabolic panel     Status: Abnormal   Collection Time: 01/20/16  1:43 PM  Result Value Ref Range   Sodium 141 135 - 145 mmol/L   Potassium 4.1 3.5 - 5.1 mmol/L   Chloride 107 101 - 111 mmol/L   CO2 24 22 - 32 mmol/L   Glucose, Bld 227 (H) 65 - 99 mg/dL   BUN 15 6 - 20 mg/dL    Creatinine, Ser 0.73 0.44 - 1.00 mg/dL   Calcium 10.1 8.9 - 10.3 mg/dL   Total Protein 7.3 6.5 - 8.1 g/dL   Albumin 4.1 3.5 - 5.0 g/dL   AST 21 15 - 41 U/L   ALT 23 14 - 54 U/L   Alkaline Phosphatase 53 38 - 126 U/L   Total Bilirubin 0.6 0.3 - 1.2 mg/dL   GFR calc non Af Amer >60 >60 mL/min   GFR calc Af Amer >60 >60 mL/min    Comment: (NOTE) The eGFR has been calculated using the CKD EPI equation. This calculation has not been validated in all clinical situations. eGFR's persistently <60 mL/min signify possible Chronic Kidney Disease.    Anion gap 10 5 - 15  Protime-INR     Status: None   Collection Time: 01/20/16  1:43 PM  Result Value Ref Range   Prothrombin Time 13.0 11.4 - 15.2 seconds   INR 0.98   Type and screen Order type and screen if day of surgery is less than 15 days from draw of preadmission visit or order morning of surgery if day of surgery is greater than 6 days from preadmission visit.     Status: None   Collection Time: 01/20/16  1:53 PM  Result Value Ref Range   ABO/RH(D) B POS    Antibody Screen NEG    Sample Expiration 02/03/2016    Extend sample reason NO TRANSFUSIONS OR PREGNANCY IN THE PAST 3 MONTHS     Estimated body mass index is 30.02 kg/m as calculated from the following:   Height as of 01/20/16: _0  (1.549 m).   Weight as of 01/20/16: 72.1 kg (158 lb 14.4 oz).   Imaging Review Plain radiographs demonstrate severe degenerative joint disease of the left knee(s). The overall alignment ismild varus. The bone quality appears to be fair for age and reported activity level.  Assessment/Plan:  End stage arthritis, left knee   The patient history, physical examination, clinical judgment of the provider and imaging studies are consistent with end stage degenerative joint disease of the left knee(s) and total knee arthroplasty is deemed medically necessary. The treatment options including medical management, injection therapy arthroscopy and arthroplasty  were discussed at length. The risks and benefits of total knee arthroplasty were presented and reviewed. The risks due to aseptic loosening, infection, stiffness, patella tracking problems, thromboembolic complications and other imponderables were discussed. The patient acknowledged the explanation, agreed to proceed with the plan and consent was signed. Patient is being admitted for inpatient treatment for surgery, pain control, PT, OT, prophylactic antibiotics, VTE prophylaxis, progressive ambulation and ADL's and discharge planning. The patient is planning to be discharged home with home health services

## 2016-01-28 NOTE — Anesthesia Procedure Notes (Signed)
Procedure Name: MAC Date/Time: 01/28/2016 7:45 AM Performed by: Garrison Columbus T Pre-anesthesia Checklist: Patient identified, Emergency Drugs available, Suction available and Patient being monitored Patient Re-evaluated:Patient Re-evaluated prior to inductionOxygen Delivery Method: Simple face mask Preoxygenation: Pre-oxygenation with 100% oxygen Intubation Type: IV induction Placement Confirmation: positive ETCO2 and breath sounds checked- equal and bilateral Dental Injury: Teeth and Oropharynx as per pre-operative assessment

## 2016-01-28 NOTE — Progress Notes (Signed)
Pt. Reports that she forgot & had full Lantus dose last p.m., instead of the 1/2 dose. Anesth. Aware of CBG & HgbA1c

## 2016-01-28 NOTE — Transfer of Care (Signed)
Immediate Anesthesia Transfer of Care Note  Patient: Wanda [name]  Procedure(s) Performed: Procedure(s): TOTAL KNEE ARTHROPLASTY (Left)  Patient Location: PACU  Anesthesia Type:MAC, Regional and Spinal  Level of Consciousness: awake, alert  and oriented  Airway & Oxygen Therapy: Patient Spontanous Breathing  Post-op Assessment: Report given to RN, Post -op Vital signs reviewed and stable and Patient moving all extremities X 4  Post vital signs: Reviewed and stable  Last Vitals:  Vitals:   01/28/16 0620  BP: (!) 213/78  Pulse: 79  Resp: 20  Temp: 36.8 C    Last Pain:  Vitals:   01/28/16 0620  TempSrc: Oral         Complications: No apparent anesthesia complications

## 2016-01-28 NOTE — Anesthesia Postprocedure Evaluation (Addendum)
Anesthesia Post Note  Patient: Wanda [name]  Procedure(s) Performed: Procedure(s) (LRB): TOTAL KNEE ARTHROPLASTY (Left)  Patient location during evaluation: PACU Anesthesia Type: Spinal Level of consciousness: oriented and awake and alert Pain management: pain level controlled Vital Signs Assessment: post-procedure vital signs reviewed and stable Respiratory status: spontaneous breathing, respiratory function stable and patient connected to nasal cannula oxygen Cardiovascular status: blood pressure returned to baseline and stable Postop Assessment: no headache, no backache and spinal receding Anesthetic complications: no       Last Vitals:  Vitals:   01/28/16 1445 01/28/16 1509  BP: (!) 154/73 (!) 162/68  Pulse: 79 85  Resp: 15 12  Temp:  36.3 C    Last Pain:  Vitals:   01/28/16 1509  TempSrc:   PainSc: Asleep    LLE Motor Response: Purposeful movement (01/28/16 1509) LLE Sensation: Tingling (01/28/16 1509) RLE Motor Response: Purposeful movement (01/28/16 1509) RLE Sensation: Tingling (01/28/16 1509) L Sensory Level: S1-Sole of foot, small toes (01/28/16 1509) R Sensory Level: S1-Sole of foot, small toes (01/28/16 1509)  Effie Berkshire

## 2016-01-28 NOTE — Brief Op Note (Signed)
01/28/2016  9:09 AM  PATIENT:  Wanda [name]  76 y.o. female  PRE-OPERATIVE DIAGNOSIS:  OSTEOARTHRITIS LEFT KNEE  POST-OPERATIVE DIAGNOSIS:  OSTEOARTHRITIS LEFT KNEE  PROCEDURE:  Procedure(s): TOTAL KNEE ARTHROPLASTY (Left)  SURGEON:  Surgeon(s) and Role:    * Dorna Leitz, MD - Primary  PHYSICIAN ASSISTANT:   ASSISTANTS: bethune   ANESTHESIA:   spinal  EBL:  Total I/O In: 1000 [I.V.:1000] Out: 200 [Blood:200]  BLOOD ADMINISTERED:none  DRAINS: none   LOCAL MEDICATIONS USED:  MARCAINE    and OTHER experel  SPECIMEN:  No Specimen  DISPOSITION OF SPECIMEN:  N/A  COUNTS:  YES  TOURNIQUET:   Total Tourniquet Time Documented: Thigh (Left) - 52 minutes Total: Thigh (Left) - 52 minutes   DICTATION: .Other Dictation: Dictation Number [id number]  PLAN OF CARE: Admit to inpatient   PATIENT DISPOSITION:  PACU - hemodynamically stable.   Delay start of Pharmacological VTE agent (>24hrs) due to surgical blood loss or risk of bleeding: no

## 2016-01-28 NOTE — Anesthesia Procedure Notes (Signed)
Spinal  Patient location during procedure: OR Start time: 01/28/2016 7:40 AM End time: 01/28/2016 7:43 AM Staffing Anesthesiologist: Suella Broad D Performed: anesthesiologist  Preanesthetic Checklist Completed: patient identified, site marked, surgical consent, pre-op evaluation, timeout performed, IV checked, risks and benefits discussed and monitors and equipment checked Spinal Block Patient position: sitting Prep: Betadine Patient monitoring: heart rate, continuous pulse ox, blood pressure and cardiac monitor Approach: midline Location: L4-5 Injection technique: single-shot Needle Needle type: Introducer and Pencan  Needle gauge: 24 G Needle length: 9 cm Additional Notes Negative paresthesia. Negative blood return. Positive free-flowing CSF. Expiration date of kit checked and confirmed. Patient tolerated procedure well, without complications.

## 2016-01-28 NOTE — Anesthesia Procedure Notes (Signed)
Anesthesia Regional Block:  Adductor canal block  Pre-Anesthetic Checklist: ,, timeout performed, Correct Patient, Correct Site, Correct Laterality, Correct Procedure, Correct Position, site marked, Risks and benefits discussed,  Surgical consent,  Pre-op evaluation,  At surgeon's request and post-op pain management  Laterality: Left  Prep: chloraprep       Needles:  Injection technique: Single-shot  Needle Type: Echogenic Needle     Needle Length: 9cm 9 cm Needle Gauge: 21 and 21 G    Additional Needles:  Procedures: ultrasound guided (picture in chart) Adductor canal block Narrative:  Start time: 01/28/2016 7:10 AM End time: 01/28/2016 7:15 AM Injection made incrementally with aspirations every 5 mL.  Performed by: Personally  Anesthesiologist: Suella Broad D  Additional Notes: Pt tolerated well.

## 2016-01-28 NOTE — Evaluation (Signed)
Physical Therapy Evaluation Patient Details Name: Wanda [name] MRN: [id number] DOB: 1940-02-07 Today's Date: 01/28/2016   History of Present Illness  76 y.o. female admitted to [facility] on 01/28/15 for elective L TKA.  Pt with significant PMHx of urinary urgency and leakage, HTN, DM, R TKA in 09/2015, and bil carpal tunnel release.  Clinical Impression  Pt is POD #0 and is moving well, min assist for a short walk around her room with RW.  She lives alone and will have limited help at discharge and is planning to go to SNF for rehab at [place] (where she went after her R TKA 4 months ago).   PT to follow acutely for deficits listed below.       Follow Up Recommendations SNF;Other (comment) ([place] arranged)    Equipment Recommendations  None recommended by PT    Recommendations for Other Services   NA    Precautions / Restrictions Precautions Precautions: Knee Precaution Booklet Issued: Yes (comment) Precaution Comments: knee exercise handout given and no pillow rule reviewed.  Required Braces or Orthoses: Knee Immobilizer - Left Knee Immobilizer - Left: Other (comment) ("until discontinued") Restrictions Weight Bearing Restrictions: Yes LLE Weight Bearing: Weight bearing as tolerated      Mobility  Bed Mobility Overal bed mobility: Needs Assistance Bed Mobility: Supine to Sit     Supine to sit: Min assist     General bed mobility comments: Min assist to help progress left leg to EOB.  HOB elevated and pt using rails.   Transfers Overall transfer level: Needs assistance Equipment used: Rolling walker (2 wheeled) Transfers: Sit to/from Stand Sit to Stand: Min assist         General transfer comment: Min assist to support trunk for balance.  Verbal cues for safe hand placement and RW use during transitions.   Ambulation/Gait Ambulation/Gait assistance: Min assist Ambulation Distance (Feet): 20 Feet Assistive device: Rolling walker (2 wheeled) Gait  Pattern/deviations: Step-to pattern;Antalgic Gait velocity: decreased   General Gait Details: Moderately antalgic gait pattern, verbal cues for correct LE sequencing and safe RW use and proximity.  Assist needed at trunk for balance.          Balance Overall balance assessment: Needs assistance Sitting-balance support: Feet supported;No upper extremity supported Sitting balance-Leahy Scale: Good     Standing balance support: Bilateral upper extremity supported Standing balance-Leahy Scale: Poor                               Pertinent Vitals/Pain Pain Assessment: 0-10 Pain Score: 6  Pain Location: left knee Pain Descriptors / Indicators: Aching;Burning Pain Intervention(s): Limited activity within patient's tolerance;Monitored during session;Repositioned;Ice applied    Home Living Family/patient expects to be discharged to:: Private residence Living Arrangements: Alone Available Help at Discharge: Family;Available PRN/intermittently Type of Home: House Home Access: Stairs to enter   Entrance Stairs-Number of Steps: 1 Home Layout: Multi-level;Full bath on main level;Able to live on main level with bedroom/bathroom Home Equipment: Gilford Rile - 2 wheels;Cane - single point;Shower seat - built in      Prior Function Level of Independence: Independent         Comments: Drives, cooks, cleans.        Extremity/Trunk Assessment   Upper Extremity Assessment Upper Extremity Assessment: Defer to OT evaluation    Lower Extremity Assessment Lower Extremity Assessment: LLE deficits/detail LLE Deficits / Details: left leg with normal post op pain and weakness.  Ankle  at least 3/5, knee 2+/5, hip flexion 2+/5    Cervical / Trunk Assessment Cervical / Trunk Assessment: Normal  Communication   Communication: No difficulties  Cognition Arousal/Alertness: Awake/alert Behavior During Therapy: WFL for tasks assessed/performed Overall Cognitive Status: Within  Functional Limits for tasks assessed                      General Comments General comments (skin integrity, edema, etc.): O2 sats 89-90 on RA after mobility, so 1 L O2 [place] returned to pt's nose after session and pt encouraged to do IS.     Exercises Total Joint Exercises Ankle Circles/Pumps: AROM;Both;20 reps   Assessment/Plan    PT Assessment Patient needs continued PT services  PT Problem List Decreased strength;Decreased range of motion;Decreased activity tolerance;Decreased balance;Decreased mobility;Decreased knowledge of use of DME;Decreased knowledge of precautions;Pain          PT Treatment Interventions DME instruction;Gait training;Stair training;Functional mobility training;Therapeutic activities;Therapeutic exercise;Balance training;Patient/family education;Manual techniques;Modalities    PT Goals (Current goals can be found in the Care Plan section)  Acute Rehab PT Goals Patient Stated Goal: to go to [facility] for rehab like she did with her other knee PT Goal Formulation: With patient Time For Goal Achievement: 02/04/16 Potential to Achieve Goals: Good    Frequency 7X/week   Barriers to discharge Decreased caregiver support lives alone and family only available PRN       End of Session Equipment Utilized During Treatment: Gait belt;Left knee immobilizer Activity Tolerance: Patient limited by fatigue;Patient limited by pain Patient left: in chair;with call bell/phone within reach;with nursing/sitter in room Nurse Communication: Mobility status         Time: 1721-1746 PT Time Calculation (min) (ACUTE ONLY): 25 min   Charges:   PT Evaluation $PT Eval Moderate Complexity: 1 Procedure PT Treatments $Gait Training: 8-22 mins        Chanda Laperle B. [place], [place], DPT [phone]   01/28/2016, 5:56 PM

## 2016-01-29 LAB — BASIC METABOLIC PANEL
ANION GAP: 9 (ref 5–15)
BUN: 8 mg/dL (ref 6–20)
CALCIUM: 9.2 mg/dL (ref 8.9–10.3)
CO2: 24 mmol/L (ref 22–32)
CREATININE: 0.73 mg/dL (ref 0.44–1.00)
Chloride: 105 mmol/L (ref 101–111)
GFR calc Af Amer: >60 mL/min (ref >60)
GLUCOSE: 262 mg/dL — AB (ref 65–99)
Potassium: 4 mmol/L (ref 3.5–5.1)
Sodium: 138 mmol/L (ref 135–145)

## 2016-01-29 LAB — GLUCOSE, CAPILLARY
Glucose-Capillary: 283 mg/dL — ABNORMAL HIGH (ref 65–99)
Glucose-Capillary: 224 mg/dL — ABNORMAL HIGH (ref 65–99)
Glucose-Capillary: 261 mg/dL — ABNORMAL HIGH (ref 65–99)
Glucose-Capillary: 223 mg/dL — ABNORMAL HIGH (ref 65–99)

## 2016-01-29 LAB — CBC
HCT: 35.9 % — ABNORMAL LOW (ref 36.0–46.0)
Hemoglobin: 11.8 g/dL — ABNORMAL LOW (ref 12.0–15.0)
MCH: 29.1 pg (ref 26.0–34.0)
MCHC: 32.9 g/dL (ref 30.0–36.0)
MCV: 88.4 fL (ref 78.0–100.0)
PLATELETS: 199 10*3/uL (ref 150–400)
RBC: 4.06 MIL/uL (ref 3.87–5.11)
RDW: 13.7 % (ref 11.5–15.5)
WBC: 8.9 10*3/uL (ref 4.0–10.5)

## 2016-01-29 NOTE — Progress Notes (Signed)
Orthopedic Tech Progress Note Patient Details:  Wanda [name] 24-Nov-1940 [id number] Put on cpm at 1335 Patient ID: Wanda [name], female   DOB: 20-Jul-1940, 76 y.o.   MRN: [id number]   Braulio Bosch 01/29/2016, 1:35 PM

## 2016-01-29 NOTE — Progress Notes (Signed)
Orthopedic Tech Progress Note Patient Details:  Wanda [name] 11/27/1940 [id number]  Patient ID: Wanda [name], female   DOB: May 21, 1940, 76 y.o.   MRN: [id number] Applied cpm 0-60  Wanda [name] 01/29/2016, 6:14 AM

## 2016-01-29 NOTE — Progress Notes (Signed)
Physical Therapy Treatment Patient Details Name: Wanda [name] MRN: [id number] DOB: 06-Jan-1941 Today's Date: 01/29/2016    History of Present Illness 76 y.o. female admitted to [facility] on 01/28/15 for elective L TKA.  Pt with significant PMHx of urinary urgency and leakage, HTN, DM, R TKA in 09/2015, and bil carpal tunnel release.    PT Comments    Pt pleasant and agreeable to PT session however mobility limited due to N/V.  Completed supine>sit with pt vomitting multiple times while transitioning to EOB.  Required min assist for LLE management.  Completed sit/stand transfer and stand pivot transfer with min guard assist but did not ambulate due to pt becoming sick again.  RN notified.      Follow Up Recommendations  SNF;Other (comment)     Equipment Recommendations  None recommended by PT    Recommendations for Other Services       Precautions / Restrictions Precautions Precautions: Knee Required Braces or Orthoses: Knee Immobilizer - Left Restrictions Weight Bearing Restrictions: Yes LLE Weight Bearing: Weight bearing as tolerated    Mobility  Bed Mobility Overal bed mobility: Needs Assistance Bed Mobility: Supine to Sit     Supine to sit: Min assist     General bed mobility comments: (A) for LLE management.  Increased time to transition due to pt with N/V with movement.    Transfers Overall transfer level: Needs assistance Equipment used: Rolling walker (2 wheeled) Transfers: Sit to/from [facility] Sit to Stand: Min guard Stand pivot transfers: Min guard       General transfer comment: cues for hand placement.   Ambulation/Gait             General Gait Details: unable to ambulate due to client with N/V.    Stairs            Wheelchair Mobility    Modified Rankin (Stroke Patients Only)       Balance                                    Cognition Arousal/Alertness: Awake/alert Behavior During Therapy: WFL for  tasks assessed/performed Overall Cognitive Status: Within Functional Limits for tasks assessed                      Exercises      General Comments        Pertinent Vitals/Pain Pain Assessment: 0-10 Pain Score: 6  Pain Location: left knee Pain Descriptors / Indicators: Aching Pain Intervention(s): Limited activity within patient's tolerance;Monitored during session;Repositioned;Ice applied    Home Living                      Prior Function            PT Goals (current goals can now be found in the care plan section) Acute Rehab PT Goals PT Goal Formulation: With patient Time For Goal Achievement: 02/04/16 Potential to Achieve Goals: Good    Frequency    7X/week      PT Plan Current plan remains appropriate    Co-evaluation             End of Session           Time: [id number] PT Time Calculation (min) (ACUTE ONLY): 27 min  Charges:  $Therapeutic Activity: 23-37 mins  G Codes:      Sena Hitch 01/29/2016, 12:35 PM   Sarajane Marek, PTA [phone] 01/29/2016

## 2016-01-29 NOTE — Progress Notes (Signed)
  PATIENT ID: Wanda [name]  MRN: [id number]  DOB/AGE:  [birth year] / 76 y.o.  1 Day Post-Op Procedure(s) (LRB): TOTAL KNEE ARTHROPLASTY (Left)  Subjective: Pain is moderate.  No c/o chest pain or SOB.   +vomit a short while ago, but now tolerating PO liquids and crackers and feeling better.  Currently IV is SL   Objective: Vital signs in last 24 hours: Temp:  [97.4 F (36.3 C)-98.3 F (36.8 C)] 98.2 F (36.8 C) (01/20 0455) Pulse Rate:  [64-102] 102 (01/20 0455) Resp:  [12-23] 16 (01/19 1700) BP: (154-194)/(63-88) 182/69 (01/20 0455) SpO2:  [94 %-100 %] 95 % (01/20 0455)  Intake/Output from previous day: 01/19 0701 - 01/20 0700 In: 1700 [P.O.:100; I.V.:1300; IV Piggyback:300] Out: 1300 [Urine:1100; Blood:200] Intake/Output this shift: No intake/output data recorded.   Recent Labs  01/29/16 0406  HGB 11.8*    Recent Labs  01/29/16 0406  WBC 8.9  RBC 4.06  HCT 35.9*  PLT 199    Recent Labs  01/29/16 0406  NA 138  K 4.0  CL 105  CO2 24  BUN 8  CREATININE 0.73  GLUCOSE 262*  CALCIUM 9.2   No results for input(s): LABPT, INR in the last 72 hours.  Physical Exam: ABD soft Sensation intact distally Intact pulses distally Dorsiflexion/Plantar flexion intact Incision: dressing C/D/I Compartment soft  Assessment/Plan: 1 Day Post-Op Procedure(s) (LRB): TOTAL KNEE ARTHROPLASTY (Left)   Advance diet Up with therapy Discharge to SNF Weight Bearing as Tolerated (WBAT)  VTE prophylaxis: pharmacologic prophylaxis (with any of the following: Eloquis) Possible d/c to SNF tomorrow if medically stable  Shanon Brow A. [place] Silos, [place], [place]  [address] Office: [phone] Mobile: [phone]  01/29/2016, 10:34 AM

## 2016-01-29 NOTE — NC FL2 (Signed)
[place] LEVEL OF CARE SCREENING TOOL     IDENTIFICATION  Patient Name: Wanda [name] Birthdate: 03-25-1940 Sex: female Admission Date (Current Location): 01/28/2016  [facility] and [place] Number:  Herbalist and Address:  [facility], [address], [place], [place] [address]      Provider Number: [id number]  Attending Physician Name and Address:  Dorna Leitz, MD  Relative Name and Phone Number:       Current Level of Care: Hospital Recommended Level of Care: [place] Prior Approval Number:    Date Approved/Denied:   PASRR Number:   [id number]   Discharge Plan: SNF    Current Diagnoses: Patient Active Problem List   Diagnosis Date Noted  . Primary osteoarthritis of left knee 10/04/2015  . Osteoporosis 08/23/2015  . Multinodular goiter (nontoxic) 04/07/2014  . Diabetes mellitus type 2, uncontrolled, without complications ([place]) [id number]  . Insulin adverse reaction 09/27/2013  . Senile nuclear sclerosis 08/14/2012  . HTN, goal below 130/80 03/10/2011  . Hyperlipidemia with target LDL less than 70 03/10/2011  . Obesity (BMI 30.0-34.9) 03/10/2011  . Health care maintenance 03/10/2011    Orientation RESPIRATION BLADDER Height & Weight     Self, Time, Situation, Place  O2 (2L) Continent Weight:   Height:     BEHAVIORAL SYMPTOMS/MOOD NEUROLOGICAL BOWEL NUTRITION STATUS      Continent Diet (Carb Modified)  AMBULATORY STATUS COMMUNICATION OF NEEDS Skin   Limited Assist Verbally Other (Comment) (Closed Incision- left leg)                       Personal Care Assistance Level of Assistance  Bathing, Feeding, Dressing Bathing Assistance: Limited assistance Feeding assistance: Independent Dressing Assistance: Limited assistance     Functional Limitations Info             SPECIAL CARE FACTORS FREQUENCY  PT (By licensed PT), OT (By licensed OT)     PT Frequency: 5 OT Frequency: 5            Contractures      Additional Factors Info  Code Status, Allergies Code Status Info: Full Code Allergies Info: ACE INHIBITORS, ALENDRONATE SODIUM, ASPIRIN, GLUCOTROL GLIPIZIDE, LIPITOR ATORVASTATIN CALCIUM, METFORMIN AND RELATED, SHELLFISH ALLERGY            Current Medications (01/29/2016):  This is the current hospital active medication list Current Facility-Administered Medications  Medication Dose Route Frequency Provider Last Rate Last Dose  . 0.9 %  sodium chloride infusion   Intravenous Continuous Gary Fleet, PA-C      . acetaminophen (TYLENOL) tablet 650 mg  650 mg Oral Q6H PRN Gary Fleet, PA-C       Or  . acetaminophen (TYLENOL) suppository 650 mg  650 mg Rectal Q6H PRN Gary Fleet, PA-C      . alum & mag hydroxide-simeth (MAALOX/MYLANTA) 200-200-20 MG/5ML suspension 30 mL  30 mL Oral Q4H PRN Gary Fleet, PA-C      . apixaban Arne Cleveland) tablet 2.5 mg  2.5 mg Oral Q12H Gary Fleet, PA-C   2.5 mg at 01/29/16 0800  . bisacodyl (DULCOLAX) EC tablet 5 mg  5 mg Oral Daily PRN Gary Fleet, PA-C      . ceFAZolin (ANCEF) IVPB 2g/100 mL premix  2 g Intravenous Q6H Gary Fleet, PA-C   2 g at 01/29/16 0536  . diphenhydrAMINE (BENADRYL) 12.5 MG/5ML elixir 12.5-25 mg  12.5-25 mg Oral Q4H PRN Gary Fleet, PA-C      .  docusate sodium (COLACE) capsule 100 mg  100 mg Oral BID Gary Fleet, PA-C   100 mg at 01/29/16 1000  . gabapentin (NEURONTIN) capsule 300 mg  300 mg Oral BID Gary Fleet, PA-C   300 mg at 01/29/16 1000  . HYDROmorphone (DILAUDID) injection 0.5-1 mg  0.5-1 mg Intravenous Q3H PRN Dorna Leitz, MD      . insulin aspart (novoLOG) injection 0-15 Units  0-15 Units Subcutaneous TID WC Gary Fleet, PA-C   8 Units at 01/29/16 [phone]  . insulin glargine (LANTUS) injection 15 Units  15 Units Subcutaneous Q2200 Gary Fleet, PA-C   15 Units at 01/28/16 2324  . magnesium citrate solution 1 Bottle  1 Bottle Oral Once PRN Gary Fleet, PA-C      . methocarbamol  (ROBAXIN) tablet 500 mg  500 mg Oral Q6H PRN Gary Fleet, PA-C   500 mg at 01/29/16 [id number]   Or  . methocarbamol (ROBAXIN) 500 mg in dextrose 5 % 50 mL IVPB  500 mg Intravenous Q6H PRN Gary Fleet, PA-C   500 mg at 01/28/16 1317  . ondansetron (ZOFRAN) tablet 4 mg  4 mg Oral Q6H PRN Gary Fleet, PA-C       Or  . ondansetron [facility]) injection 4 mg  4 mg Intravenous Q6H PRN Gary Fleet, PA-C      . oxyCODONE (Oxy IR/ROXICODONE) immediate release tablet 5-10 mg  5-10 mg Oral Q3H PRN Gary Fleet, PA-C   10 mg at 01/29/16 0423  . polyethylene glycol (MIRALAX / GLYCOLAX) packet 17 g  17 g Oral Daily PRN Gary Fleet, PA-C      . promethazine (PHENERGAN) injection 6.25 mg  6.25 mg Intravenous Q6H PRN Gary Fleet, PA-C      . zolpidem (AMBIEN) tablet 5 mg  5 mg Oral QHS PRN Gary Fleet, PA-C         Discharge Medications: Please see discharge summary for a list of discharge medications.  Relevant Imaging Results:  Relevant Lab Results:   Additional Information SS#: [id number]  Weston Anna, LCSW

## 2016-01-29 NOTE — Discharge Instructions (Signed)
INSTRUCTIONS AFTER JOINT REPLACEMENT  ° °o Remove items at home which could result in a fall. This includes throw rugs or furniture in walking pathways °o ICE to the affected joint every three hours while awake for 30 minutes at a time, for at least the first 3-5 days, and then as needed for pain and swelling.  Continue to use ice for pain and swelling. You may notice swelling that will progress down to the foot and ankle.  This is normal after surgery.  Elevate your leg when you are not up walking on it.   °o Continue to use the breathing machine you got in the hospital (incentive spirometer) which will help keep your temperature down.  It is common for your temperature to cycle up and down following surgery, especially at night when you are not up moving around and exerting yourself.  The breathing machine keeps your lungs expanded and your temperature down. ° ° °DIET:  As you were doing prior to hospitalization, we recommend a well-balanced diet. ° °DRESSING / WOUND CARE / SHOWERING ° °Keep the surgical dressing until follow up.  The dressing is water proof, so you can shower without any extra covering.  IF THE DRESSING FALLS OFF or the wound gets wet inside, change the dressing with sterile gauze.  Please use good hand washing techniques before changing the dressing.  Do not use any lotions or creams on the incision until instructed by your surgeon.   ° °ACTIVITY ° °o Increase activity slowly as tolerated, but follow the weight bearing instructions below.   °o No driving for 6 weeks or until further direction given by your physician.  You cannot drive while taking narcotics.  °o No lifting or carrying greater than 10 lbs. until further directed by your surgeon. °o Avoid periods of inactivity such as sitting longer than an hour when not asleep. This helps prevent blood clots.  °o You may return to work once you are authorized by your doctor.  ° ° ° °WEIGHT BEARING  ° °Weight bearing as tolerated with assist  device (walker, cane, etc) as directed, use it as long as suggested by your surgeon or therapist, typically at least 4-6 weeks. ° ° °EXERCISES ° °Results after joint replacement surgery are often greatly improved when you follow the exercise, range of motion and muscle strengthening exercises prescribed by your doctor. Safety measures are also important to protect the joint from further injury. Any time any of these exercises cause you to have increased pain or swelling, decrease what you are doing until you are comfortable again and then slowly increase them. If you have problems or questions, call your caregiver or physical therapist for advice.  ° °Rehabilitation is important following a joint replacement. After just a few days of immobilization, the muscles of the leg can become weakened and shrink (atrophy).  These exercises are designed to build up the tone and strength of the thigh and leg muscles and to improve motion. Often times heat used for twenty to thirty minutes before working out will loosen up your tissues and help with improving the range of motion but do not use heat for the first two weeks following surgery (sometimes heat can increase post-operative swelling).  ° °These exercises can be done on a training (exercise) mat, on the floor, on a table or on a bed. Use whatever works the best and is most comfortable for you.    Use music or television while you are exercising so that   the exercises are a pleasant break in your day. This will make your life better with the exercises acting as a break in your routine that you can look forward to.   Perform all exercises about fifteen times, three times per day or as directed.  You should exercise both the operative leg and the other leg as well. ° °Exercises include: °  °• Quad Sets - Tighten up the muscle on the front of the thigh (Quad) and hold for 5-10 seconds.   °• Straight Leg Raises - With your knee straight (if you were given a brace, keep it on),  lift the leg to 60 degrees, hold for 3 seconds, and slowly lower the leg.  Perform this exercise against resistance later as your leg gets stronger.  °• Leg Slides: Lying on your back, slowly slide your foot toward your buttocks, bending your knee up off the floor (only go as far as is comfortable). Then slowly slide your foot back down until your leg is flat on the floor again.  °• Angel Wings: Lying on your back spread your legs to the side as far apart as you can without causing discomfort.  °• Hamstring Strength:  Lying on your back, push your heel against the floor with your leg straight by tightening up the muscles of your buttocks.  Repeat, but this time bend your knee to a comfortable angle, and push your heel against the floor.  You may put a pillow under the heel to make it more comfortable if necessary.  ° °A rehabilitation program following joint replacement surgery can speed recovery and prevent re-injury in the future due to weakened muscles. Contact your doctor or a physical therapist for more information on knee rehabilitation.  ° ° °CONSTIPATION ° °Constipation is defined medically as fewer than three stools per week and severe constipation as less than one stool per week.  Even if you have a regular bowel pattern at home, your normal regimen is likely to be disrupted due to multiple reasons following surgery.  Combination of anesthesia, postoperative narcotics, change in appetite and fluid intake all can affect your bowels.  ° °YOU MUST use at least one of the following options; they are listed in order of increasing strength to get the job done.  They are all available over the counter, and you may need to use some, POSSIBLY even all of these options:   ° °Drink plenty of fluids (prune juice may be helpful) and high fiber foods °Colace 100 mg by mouth twice a day  °Senokot for constipation as directed and as needed Dulcolax (bisacodyl), take with full glass of water  °Miralax (polyethylene glycol)  once or twice a day as needed. ° °If you have tried all these things and are unable to have a bowel movement in the first 3-4 days after surgery call either your surgeon or your primary doctor.   ° °If you experience loose stools or diarrhea, hold the medications until you stool forms back up.  If your symptoms do not get better within 1 week or if they get worse, check with your doctor.  If you experience "the worst abdominal pain ever" or develop nausea or vomiting, please contact the office immediately for further recommendations for treatment. ° ° °ITCHING:  If you experience itching with your medications, try taking only a single pain pill, or even half a pain pill at a time.  You can also use Benadryl over the counter for itching or also to   help with sleep.  ° °TED HOSE STOCKINGS:  Use stockings on both legs until for at least 2 weeks or as directed by physician office. They may be removed at night for sleeping. ° °MEDICATIONS:  See your medication summary on the “After Visit Summary” that nursing will review with you.  You may have some home medications which will be placed on hold until you complete the course of blood thinner medication.  It is important for you to complete the blood thinner medication as prescribed. ° °PRECAUTIONS:  If you experience chest pain or shortness of breath - call 911 immediately for transfer to the hospital emergency department.  ° °If you develop a fever greater that 101 F, purulent drainage from wound, increased redness or drainage from wound, foul odor from the wound/dressing, or calf pain - CONTACT YOUR SURGEON.   °                                                °FOLLOW-UP APPOINTMENTS:  If you do not already have a post-op appointment, please call the office for an appointment to be seen by your surgeon.  Guidelines for how soon to be seen are listed in your “After Visit Summary”, but are typically between 1-4 weeks after surgery. ° °OTHER INSTRUCTIONS:  ° °Knee  Replacement:  Do not place pillow under knee, focus on keeping the knee straight while resting. CPM instructions: 0-90 degrees, 2 hours in the morning, 2 hours in the afternoon, and 2 hours in the evening. Place foam block, curve side up under heel at all times except when in CPM or when walking.  DO NOT modify, tear, cut, or change the foam block in any way. ° °MAKE SURE YOU:  °• Understand these instructions.  °• Get help right away if you are not doing well or get worse.  ° ° °Thank you for letting us be a part of your medical care team.  It is a privilege we respect greatly.  We hope these instructions will help you stay on track for a fast and full recovery!  ° °Information on my medicine - ELIQUIS® (apixaban) ° °This medication education was reviewed with me or my healthcare representative as part of my discharge preparation.  The pharmacist that spoke with me during my hospital stay was:  Long Brimage Brown, RPH ° °Why was Eliquis® prescribed for you? °Eliquis® was prescribed for you to reduce the risk of blood clots forming after orthopedic surgery.   ° °What do You need to know about Eliquis®? °Take your Eliquis® TWICE DAILY - one tablet in the morning and one tablet in the evening with or without food.  It would be best to take the dose about the same time each day. ° °If you have difficulty swallowing the tablet whole please discuss with your pharmacist how to take the medication safely. ° °Take Eliquis® exactly as prescribed by your doctor and DO NOT stop taking Eliquis® without talking to the doctor who prescribed the medication.  Stopping without other medication to take the place of Eliquis® may increase your risk of developing a clot. ° °After discharge, you should have regular check-up appointments with your healthcare provider that is prescribing your Eliquis®. ° °What do you do if you miss a dose? °If a dose of ELIQUIS® is not taken at the scheduled time, take   it as soon as possible on the same  day and twice-daily administration should be resumed.  The dose should not be doubled to make up for a missed dose.  Do not take more than one tablet of ELIQUIS at the same time. ° °Important Safety Information °A possible side effect of Eliquis® is bleeding. You should call your healthcare provider right away if you experience any of the following: °? Bleeding from an injury or your nose that does not stop. °? Unusual colored urine (red or dark brown) or unusual colored stools (red or black). °? Unusual bruising for unknown reasons. °? A serious fall or if you hit your head (even if there is no bleeding). ° °Some medicines may interact with Eliquis® and might increase your risk of bleeding or clotting while on Eliquis®. To help avoid this, consult your healthcare provider or pharmacist prior to using any new prescription or non-prescription medications, including herbals, vitamins, non-steroidal anti-inflammatory drugs (NSAIDs) and supplements. ° °This website has more information on Eliquis® (apixaban): http://www.eliquis.com/eliquis/home ° ° °

## 2016-01-30 LAB — CBC
HCT: 32.9 % — ABNORMAL LOW (ref 36.0–46.0)
Hemoglobin: 11 g/dL — ABNORMAL LOW (ref 12.0–15.0)
MCH: 29.2 pg (ref 26.0–34.0)
MCHC: 33.4 g/dL (ref 30.0–36.0)
MCV: 87.3 fL (ref 78.0–100.0)
Platelets: 198 10*3/uL (ref 150–400)
RBC: 3.77 MIL/uL — ABNORMAL LOW (ref 3.87–5.11)
RDW: 13.4 % (ref 11.5–15.5)
WBC: 11.9 10*3/uL — ABNORMAL HIGH (ref 4.0–10.5)

## 2016-01-30 LAB — GLUCOSE, CAPILLARY
GLUCOSE-CAPILLARY: 152 mg/dL — AB (ref 65–99)
Glucose-Capillary: 181 mg/dL — ABNORMAL HIGH (ref 65–99)
Glucose-Capillary: 208 mg/dL — ABNORMAL HIGH (ref 65–99)
Glucose-Capillary: 141 mg/dL — ABNORMAL HIGH (ref 65–99)

## 2016-01-30 NOTE — Progress Notes (Signed)
Physical Therapy Treatment Patient Details Name: Wanda [name] MRN: [id number] DOB: 1940/06/13 Today's Date: 01/30/2016    History of Present Illness 76 y.o. female admitted to [facility] on 01/28/15 for elective L TKA.  Pt with significant PMHx of urinary urgency and leakage, HTN, DM, R TKA in 09/2015, and bil carpal tunnel release.    PT Comments    Pt is moving better this PM session but continues to be limited with gait distance and ability to weight bear through LLE. Overall improved tolerance for ROM activities. Pt will benefit from SNF for rehab prior to DC home.   Follow Up Recommendations  SNF     Equipment Recommendations  None recommended by PT    Recommendations for Other Services       Precautions / Restrictions Precautions Precautions: Knee Precaution Booklet Issued: Yes (comment) Precaution Comments: knee exercise handout given and no pillow rule reviewed.  Required Braces or Orthoses: Knee Immobilizer - Left Knee Immobilizer - Left: Other (comment) (until discontinued) Restrictions Weight Bearing Restrictions: Yes LLE Weight Bearing: Weight bearing as tolerated    Mobility  Bed Mobility Overal bed mobility: Needs Assistance Bed Mobility: Sit to Supine     Supine to sit: Min guard Sit to supine: Min guard;Min assist   General bed mobility comments: Min A to help progress LLE into bed. Able to move up in bed without assistance  Transfers Overall transfer level: Needs assistance Equipment used: Rolling walker (2 wheeled) Transfers: Sit to/from Stand Sit to Stand: Min assist;Min guard         General transfer comment: pt attempted to stand x 2 from recliner and was unable to achieve standing and fell back into recliner during second attempt. Assisted to standing with third attempt with min guard  Ambulation/Gait Ambulation/Gait assistance: Min guard Ambulation Distance (Feet): 10 Feet Assistive device: Rolling walker (2 wheeled) Gait Pattern/deviations:  Step-to pattern;Antalgic Gait velocity: decreased Gait velocity interpretation: Below normal speed for age/gender General Gait Details: Mod antalgic gait, VC's for correct sequencing and position within RW.    Stairs            Wheelchair Mobility    Modified Rankin (Stroke Patients Only)       Balance Overall balance assessment: Needs assistance Sitting-balance support: Feet supported;No upper extremity supported Sitting balance-Leahy Scale: Good Sitting balance - Comments: sitting EOB no back support   Standing balance support: Bilateral upper extremity supported Standing balance-Leahy Scale: Poor Standing balance comment: relies on Rw for stability                    Cognition Arousal/Alertness: Awake/alert Behavior During Therapy: WFL for tasks assessed/performed Overall Cognitive Status: Within Functional Limits for tasks assessed                      Exercises Total Joint Exercises Ankle Circles/Pumps: AROM;Both;20 reps Quad Sets: AROM;Left;10 reps;Supine Short Arc Quad: AAROM;Left;10 reps;Supine Heel Slides: AAROM;Left;10 reps;Supine Straight Leg Raises: AAROM;Left;10 reps;Supine Goniometric ROM: 0-50    General Comments        Pertinent Vitals/Pain Pain Assessment: Faces Pain Score: 6  Faces Pain Scale: Hurts little more Pain Location: left knee with ROM Pain Descriptors / Indicators: Guarding;Grimacing Pain Intervention(s): Limited activity within patient's tolerance;Monitored during session;Ice applied    Home Living                      Prior Function  PT Goals (current goals can now be found in the care plan section) Acute Rehab PT Goals Patient Stated Goal: to go to [facility] for rehab like she did with her other knee Progress towards PT goals: Progressing toward goals    Frequency    7X/week      PT Plan Current plan remains appropriate    Co-evaluation             End of Session  Equipment Utilized During Treatment: Gait belt;Left knee immobilizer Activity Tolerance: Patient tolerated treatment well;Patient limited by fatigue Patient left: in bed;with call bell/phone within reach;in CPM     Time: 1352-1410 PT Time Calculation (min) (ACUTE ONLY): 18 min  Charges:  $Gait Training: 8-22 mins $Therapeutic Exercise: 8-22 mins                    G Codes:      Scheryl Marten PT, DPT  [phone]  01/30/2016, 2:15 PM

## 2016-01-30 NOTE — Progress Notes (Signed)
  PATIENT ID: Wanda [name]  MRN: [id number]  DOB/AGE:  13-Mar-1940 / 76 y.o.  2 Days Post-Op Procedure(s) (LRB): TOTAL KNEE ARTHROPLASTY (Left)  Subjective: Pain is moderate.  No c/o chest pain or SOB.  Tol PO fine, + flatus Reports being ready for SNF at [facility]   Objective: Vital signs in last 24 hours: Temp:  [98.2 F (36.8 C)-98.4 F (36.9 C)] 98.4 F (36.9 C) (01/21 0444) Pulse Rate:  [96-98] 98 (01/21 0444) BP: (138-180)/(54-66) 157/66 (01/21 0444) SpO2:  [95 %-98 %] 95 % (01/21 0444)  Intake/Output from previous day: 01/20 0701 - 01/21 0700 In: 920 [P.O.:920] Out: -  Intake/Output this shift: No intake/output data recorded.   Recent Labs  01/29/16 0406 01/30/16 0540  HGB 11.8* 11.0*    Recent Labs  01/29/16 0406 01/30/16 0540  WBC 8.9 11.9*  RBC 4.06 3.77*  HCT 35.9* 32.9*  PLT 199 198    Recent Labs  01/29/16 0406  NA 138  K 4.0  CL 105  CO2 24  BUN 8  CREATININE 0.73  GLUCOSE 262*  CALCIUM 9.2   No results for input(s): LABPT, INR in the last 72 hours.  Physical Exam: ABD soft Sensation intact distally Intact pulses distally Dorsiflexion/Plantar flexion intact Incision: dressing C/D/I Compartment soft  Assessment/Plan: 2 Days Post-Op Procedure(s) (LRB): TOTAL KNEE ARTHROPLASTY (Left)   Advance diet Up with therapy Discharge to SNF Weight Bearing as Tolerated (WBAT)  VTE prophylaxis: pharmacologic prophylaxis (with any of the following: Eloquis) d/c to SNF tomorrow  Shanon Brow [address], [place], [place]  [address] Office: [phone] Mobile: [phone]  01/30/2016, 12:28 PM

## 2016-01-30 NOTE — Clinical Social Work Placement (Signed)
   CLINICAL SOCIAL WORK PLACEMENT  NOTE  Date:  01/30/2016  Patient Details  Name: Glema Comfort MRN: [id number] Date of Birth: [birth year]  Clinical Social Work is seeking post-discharge placement for this patient at the [place] level of care (*CSW will initial, date and re-position this form in  chart as items are completed):      Patient/family provided with [facility] Work Department's list of facilities offering this level of care within the geographic area requested by the patient (or if unable, by the patient's family).      Patient/family informed of their freedom to choose among providers that offer the needed level of care, that participate in Medicare, Medicaid or managed care program needed by the patient, have an available bed and are willing to accept the patient.      Patient/family informed of [facility]'s ownership interest in [facility] and [facility], as well as of the fact that they are under no obligation to receive care at these facilities.  PASRR submitted to EDS on       PASRR number received on 01/29/16     Existing PASRR number confirmed on       FL2 transmitted to all facilities in geographic area requested by pt/family on 01/29/16     FL2 transmitted to all facilities within larger geographic area on       Patient informed that his/her managed care company has contracts with or will negotiate with certain facilities, including the following:        Yes   Patient/family informed of bed offers received.  Patient chooses bed at [facility] [place] recommends and patient chooses bed at      Patient to be transferred to [facility] [place] on  .  Patient to be transferred to facility by       Patient family notified on   of transfer.  Name of family member notified:        PHYSICIAN Please prepare priority discharge summary, including medications, Please prepare prescriptions, Please  sign FL2     Additional Comment:    _______________________________________________ Alla German, LCSW 01/30/2016, 11:28 AM

## 2016-01-30 NOTE — Progress Notes (Signed)
Physical Therapy Treatment Patient Details Name: Wanda [name] MRN: [id number] DOB: 05/26/1940 Today's Date: 01/30/2016    History of Present Illness 76 y.o. female admitted to [facility] on 01/28/15 for elective L TKA.  Pt with significant PMHx of urinary urgency and leakage, HTN, DM, R TKA in 09/2015, and bil carpal tunnel release.    PT Comments    Pt is POD 2 and moving better with therapy this afternoon. Performed short distance gait in room and instructed on supine exercises. Pt has improved mobility but continues to be limited by fatigue. No nausea or vomiting since yesterday.    Follow Up Recommendations  SNF;Other (comment) ([facility] arranged)     Equipment Recommendations  None recommended by PT    Recommendations for Other Services       Precautions / Restrictions Precautions Precautions: Knee Precaution Booklet Issued: Yes (comment) Precaution Comments: knee exercise handout given and no pillow rule reviewed.  Required Braces or Orthoses: Knee Immobilizer - Left Knee Immobilizer - Left: Other (comment) (until discontinued) Restrictions Weight Bearing Restrictions: Yes LLE Weight Bearing: Weight bearing as tolerated    Mobility  Bed Mobility Overal bed mobility: Needs Assistance Bed Mobility: Supine to Sit     Supine to sit: Min guard     General bed mobility comments: Min guard to help progress LLE to EOB. HOB elevated   Transfers Overall transfer level: Needs assistance Equipment used: Rolling walker (2 wheeled) Transfers: Sit to/from Stand Sit to Stand: Min guard         General transfer comment: cues for hand placement.   Ambulation/Gait Ambulation/Gait assistance: Min guard Ambulation Distance (Feet): 30 Feet Assistive device: Rolling walker (2 wheeled) Gait Pattern/deviations: Step-to pattern;Antalgic Gait velocity: decreased Gait velocity interpretation: Below normal speed for age/gender General Gait Details: Mod antalgic gait, VC's for correct  sequencing and position within RW.    Stairs            Wheelchair Mobility    Modified Rankin (Stroke Patients Only)       Balance Overall balance assessment: Needs assistance Sitting-balance support: Feet supported;No upper extremity supported Sitting balance-Leahy Scale: Good Sitting balance - Comments: sitting EOB no back support   Standing balance support: Bilateral upper extremity supported Standing balance-Leahy Scale: Poor Standing balance comment: relies on Rw for stability                    Cognition Arousal/Alertness: Awake/alert Behavior During Therapy: WFL for tasks assessed/performed Overall Cognitive Status: Within Functional Limits for tasks assessed                      Exercises Total Joint Exercises Ankle Circles/Pumps: AROM;Both;20 reps Quad Sets: AROM;Left;10 reps;Supine Short Arc Quad: AAROM;Left;10 reps;Supine Heel Slides: AAROM;Left;10 reps;Supine Straight Leg Raises: AAROM;Left;10 reps;Supine    General Comments        Pertinent Vitals/Pain Pain Assessment: 0-10 Pain Score: 6  Pain Location: left knee Pain Descriptors / Indicators: Aching Pain Intervention(s): Limited activity within patient's tolerance;Monitored during session;Repositioned;Ice applied    Home Living                      Prior Function            PT Goals (current goals can now be found in the care plan section) Acute Rehab PT Goals Patient Stated Goal: to go to [facility] for rehab like she did with her other knee Progress towards PT goals: Progressing toward goals  Frequency    7X/week      PT Plan Current plan remains appropriate    Co-evaluation             End of Session Equipment Utilized During Treatment: Gait belt;Left knee immobilizer Activity Tolerance: Patient tolerated treatment well;Patient limited by fatigue Patient left: in chair;with call bell/phone within reach     Time: [id number] PT Time  Calculation (min) (ACUTE ONLY): 23 min  Charges:  $Gait Training: 8-22 mins $Therapeutic Exercise: 8-22 mins                    G Codes:      Scheryl Marten PT, DPT  [phone]  01/30/2016, 10:26 AM

## 2016-01-30 NOTE — Clinical Social Work Note (Signed)
Clinical Social Work Assessment  Patient Details  Name: Wanda [name] MRN: [id number] Date of Birth: Jun 20, 1940  Date of referral:  01/30/16               Reason for consult:  Facility Placement                Permission sought to share information with:  Family Supports Permission granted to share information::  Yes, Verbal Permission Granted  Name::     [name]  Agency::     Relationship::  Daughter  Contact Information:  [phone]  Housing/Transportation Living arrangements for the past 2 months:  [place] of Information:  Patient Patient Interpreter Needed:  None Criminal Activity/Legal Involvement Pertinent to Current Situation/Hospitalization:  No - Comment as needed Significant Relationships:  Adult Children Lives with:  Self Do you feel safe going back to the place where you live?  Yes Need for family participation in patient care:     Care giving concerns:  No family or friends at bedside during initial assessment. Pt gave CSW verbal permission to contact daughter.   Social Worker assessment / plan:  CSW spoke with pt at bedside to complete initial assessment. Pt lives alone. Pt reports she has a daughter in the area who serves as a support and can assist in her care. Pt is agreeable to SNF placement at this time. Pt has made arrangements with [facility]. Pt must have qualifying 3 night stay, CSW explained this to pt--pt is agreeable. CSW will follow up with facility.   Employment status:  Retired Forensic scientist:  Medicare PT Recommendations:  [place] / Referral to community resources:  [place]  Patient/Family's Response to care:  Pt verbalized understanding of CSW role and expressed appreciation for support. Pt denies any concern regarding pt care at this time.  Patient/Family's Understanding of and Emotional Response to Diagnosis, Current Treatment, and Prognosis:  Pt understanding and realistic  regarding physical limitations. Pt is agreeable to SNF placement at d/c. Pt denies any concern regarding treatment plan at this time. CSW will continue to provide support.  Emotional Assessment Appearance:  Appears stated age Attitude/Demeanor/Rapport:   (Patient was appropriate.) Affect (typically observed):  Accepting, Appropriate, Calm, Pleasant Orientation:  Oriented to Self, Oriented to Place, Oriented to  Time, Oriented to Situation Alcohol / Substance use:  Not Applicable Psych involvement (Current and /or in the community):  No (Comment)  Discharge Needs  Concerns to be addressed:  No discharge needs identified Readmission within the last 30 days:  No Current discharge risk:  Dependent with Mobility Barriers to Discharge:  Continued Medical Work up   [employer], LCSW 01/30/2016, 11:25 AM

## 2016-01-31 DIAGNOSIS — E1165 Type 2 diabetes mellitus with hyperglycemia: Secondary | ICD-10-CM | POA: Diagnosis not present

## 2016-01-31 DIAGNOSIS — M1712 Unilateral primary osteoarthritis, left knee: Secondary | ICD-10-CM | POA: Diagnosis not present

## 2016-01-31 DIAGNOSIS — E042 Nontoxic multinodular goiter: Secondary | ICD-10-CM | POA: Diagnosis not present

## 2016-01-31 DIAGNOSIS — N3941 Urge incontinence: Secondary | ICD-10-CM | POA: Diagnosis not present

## 2016-01-31 DIAGNOSIS — E785 Hyperlipidemia, unspecified: Secondary | ICD-10-CM | POA: Diagnosis not present

## 2016-01-31 DIAGNOSIS — E119 Type 2 diabetes mellitus without complications: Secondary | ICD-10-CM | POA: Diagnosis not present

## 2016-01-31 DIAGNOSIS — Z85828 Personal history of other malignant neoplasm of skin: Secondary | ICD-10-CM | POA: Diagnosis not present

## 2016-01-31 DIAGNOSIS — R531 Weakness: Secondary | ICD-10-CM | POA: Diagnosis not present

## 2016-01-31 DIAGNOSIS — E669 Obesity, unspecified: Secondary | ICD-10-CM | POA: Diagnosis not present

## 2016-01-31 DIAGNOSIS — M810 Age-related osteoporosis without current pathological fracture: Secondary | ICD-10-CM | POA: Diagnosis not present

## 2016-01-31 DIAGNOSIS — M25569 Pain in unspecified knee: Secondary | ICD-10-CM | POA: Diagnosis not present

## 2016-01-31 DIAGNOSIS — R262 Difficulty in walking, not elsewhere classified: Secondary | ICD-10-CM | POA: Diagnosis not present

## 2016-01-31 DIAGNOSIS — Z96652 Presence of left artificial knee joint: Secondary | ICD-10-CM | POA: Diagnosis not present

## 2016-01-31 DIAGNOSIS — I10 Essential (primary) hypertension: Secondary | ICD-10-CM | POA: Diagnosis not present

## 2016-01-31 DIAGNOSIS — H269 Unspecified cataract: Secondary | ICD-10-CM | POA: Diagnosis not present

## 2016-01-31 DIAGNOSIS — Z471 Aftercare following joint replacement surgery: Secondary | ICD-10-CM | POA: Diagnosis not present

## 2016-01-31 LAB — GLUCOSE, CAPILLARY
GLUCOSE-CAPILLARY: 211 mg/dL — AB (ref 65–99)
Glucose-Capillary: 168 mg/dL — ABNORMAL HIGH (ref 65–99)

## 2016-01-31 LAB — CBC
HEMATOCRIT: 31 % — AB (ref 36.0–46.0)
Hemoglobin: 10.4 g/dL — ABNORMAL LOW (ref 12.0–15.0)
MCH: 29.5 pg (ref 26.0–34.0)
MCHC: 33.5 g/dL (ref 30.0–36.0)
MCV: 88.1 fL (ref 78.0–100.0)
Platelets: 201 10*3/uL (ref 150–400)
RBC: 3.52 MIL/uL — AB (ref 3.87–5.11)
RDW: 13.4 % (ref 11.5–15.5)
WBC: 8.1 10*3/uL (ref 4.0–10.5)

## 2016-01-31 LAB — TB SKIN TEST
Induration: 0 mm
TB Skin Test: NEGATIVE

## 2016-01-31 NOTE — Discharge Summary (Signed)
Patient ID: Wanda [name] MRN: [id number] DOB/AGE: 02-29-1940 76 y.o.  Admit date: 01/28/2016 Discharge date: 01/31/2016  Admission Diagnoses:  Principal Problem:   Primary osteoarthritis of left knee Active Problems:   Diabetes mellitus type 2, uncontrolled, without complications [facility])   Discharge Diagnoses:  Same  Past Medical History:  Diagnosis Date  . Arthritis   . Cataract   . Diabetes mellitus   . Hypertension   . Skin cancer    Removed from face  . Urgency of urination   . Urinary leakage     Surgeries: Procedure(s):Left TOTAL KNEE ARTHROPLASTY on 01/28/2016   Discharged Condition: Improved  Hospital Course: Dakota Stangl is an 76 y.o. female who was admitted 01/28/2016 for operative treatment ofPrimary osteoarthritis of left knee. Patient has severe unremitting pain that affects sleep, daily activities, and work/hobbies. After pre-op clearance the patient was taken to the operating room on 01/28/2016 and underwent  Procedure(s): Left TOTAL KNEE ARTHROPLASTY.    Patient was given perioperative antibiotics: Anti-infectives    Start     Dose/Rate Route Frequency Ordered Stop   01/28/16 1700  ceFAZolin (ANCEF) IVPB 2g/100 mL premix    Comments:  Pt is an uncontrolled diabetic   2 g 200 mL/hr over 30 Minutes Intravenous Every 6 hours 01/28/16 1627 01/29/16 1130   01/28/16 0822  cefUROXime (ZINACEF) injection  Status:  Discontinued       As needed 01/28/16 0822 01/28/16 0927   01/28/16 0554  ceFAZolin (ANCEF) IVPB 2g/100 mL premix     2 g 200 mL/hr over 30 Minutes Intravenous On call to O.R. 01/28/16 6226 01/28/16 0755       Patient was given sequential compression devices, early ambulation, and chemoprophylaxis to prevent DVT.She made slow but steady progress with physical therapy. At the time of discharge her vital signs are stable. Her left knee dressing was clean and dry. Her calf was soft. And her pain was managed with oral pain medication.  Patient benefited  maximally from hospital stay and there were no complications.    Recent vital signs: Patient Vitals for the past 24 hrs:  BP Temp Temp src Pulse Resp SpO2  01/31/16 0445 (!) 178/63 98.5 F (36.9 C) Oral 96 16 98 %  01/30/16 2035 (!) 151/70 99.8 F (37.7 C) Oral 98 - 98 %  01/30/16 1300 (!) 136/55 98.5 F (36.9 C) Oral 93 - 99 %     Recent laboratory studies:  Recent Labs  01/29/16 0406 01/30/16 0540 01/31/16 0418  WBC 8.9 11.9* 8.1  HGB 11.8* 11.0* 10.4*  HCT 35.9* 32.9* 31.0*  PLT 199 198 201  NA 138  --   --   K 4.0  --   --   CL 105  --   --   CO2 24  --   --   BUN 8  --   --   CREATININE 0.73  --   --   GLUCOSE 262*  --   --   CALCIUM 9.2  --   --      Discharge Medications:   Allergies as of 01/31/2016      Reactions   Ace Inhibitors Swelling, Cough   Pt had cough and diarrhea with first few doses of medication; also had swelling of right eyelid. Stopped medication on 03/17/11.   Alendronate Sodium Other (See Comments)   Whole body hurt   Aspirin Hives   Glucotrol [glipizide] Other (See Comments)   Migraine "color flashes" in L eye with  dizziness and lightheadedness.   Lipitor [atorvastatin Calcium] Other (See Comments)   Whole body hurt   Metformin And Related    Severe abdominal pain   Shellfish Allergy    Intolerant of fresh shellfish, reports the reaction is GI upset, denies hives, denies any swelling  Reports that she can tolerate canned seafood.       Medication List    TAKE these medications   acetaminophen 325 MG tablet Commonly known as:  TYLENOL Take 650 mg by mouth every 6 (six) hours as needed for mild pain.   apixaban 2.5 MG Tabs tablet Commonly known as:  ELIQUIS Take 1 tablet (2.5 mg total) by mouth 2 (two) times daily. Take x 3 weeks post op   BD PEN NEEDLE NANO U/F 32G X 4 MM Misc Generic drug:  Insulin Pen Needle USE TO INJECT INSULIN ONCE DAILY   docusate sodium 100 MG capsule Commonly known as:  COLACE Take 1 capsule (100 mg  total) by mouth 2 (two) times daily.   glucose blood test strip Test blood sugar 3 times a day. Dx code: 250.00   Insulin Glargine 100 UNIT/ML Solostar Pen Commonly known as:  LANTUS SOLOSTAR Inject 15 Units into the skin daily at 10 pm.   INSULIN SYRINGE .5CC/31GX5/16" 31G X 5/16" 0.5 ML Misc Use to inject insulin 1 time daily.   niacin 500 MG CR tablet Commonly known as:  NIASPAN Take 1 tablet (500 mg total) by mouth at bedtime. Increase to 2 tablets after 1 month What changed:  when to take this  additional instructions   ONE TOUCH ULTRA MINI w/Device Kit Use to test blood sugar daily as instructed. Dx: E11.65   oxyCODONE-acetaminophen 5-325 MG tablet Commonly known as:  PERCOCET/ROXICET Take 1-2 tablets by mouth every 6 (six) hours as needed for severe pain.   tiZANidine 2 MG tablet Commonly known as:  ZANAFLEX Take 1 tablet (2 mg total) by mouth every 8 (eight) hours as needed for muscle spasms.   vitamin E 200 UNIT capsule Take 200 Units by mouth daily. Reported on 05/31/2015       Diagnostic Studies: No results found.  Disposition: 03-Skilled Nursing Facility  Discharge Instructions    CPM    Complete by:  As directed    Continuous passive motion machine (CPM):      Use the CPM from 0 to 70 degrees for 8 hours per day.      You may increase by 5-10 per day.  You may break it up into 2 or 3 sessions per day.      Use CPM for 1-2 weeks or until you are told to stop.   Call MD / Call 911    Complete by:  As directed    If you experience chest pain or shortness of breath, CALL 911 and be transported to the hospital emergency room.  If you develope a fever above 101 F, pus (white drainage) or increased drainage or redness at the wound, or calf pain, call your surgeon's office.   Constipation Prevention    Complete by:  As directed    Drink plenty of fluids.  Prune juice may be helpful.  You may use a stool softener, such as Colace (over the counter) 100 mg  twice a day.  Use MiraLax (over the counter) for constipation as needed.   Diet general    Complete by:  As directed    Do not put a pillow under the knee. Place  it under the heel.    Complete by:  As directed    Increase activity slowly as tolerated    Complete by:  As directed    Weight bearing as tolerated    Complete by:  As directed    Laterality:  left   Extremity:  Lower   Weight bearing as tolerated    Complete by:  As directed    Laterality:  left   Extremity:  Lower      Follow-up Information    GRAVES,JOHN L, MD. Schedule an appointment as soon as possible for a visit in 2 week(s).   Specialty:  Orthopedic Surgery Contact information: [place] [address] [phone]            Signed: Erlene Senters 01/31/2016, 8:28 AM

## 2016-01-31 NOTE — Progress Notes (Signed)
Went in Pt's room this morning and Pt could not find eye glasses. Pt stated they were on her tray and she thinks they might of went down with food tray. I called cafeteria they said they did not find glasses. Called Security left page. Waiting on return call.

## 2016-01-31 NOTE — Op Note (Signed)
NAME:  Wanda [name], Wanda [name]               ACCOUNT NO.:  [id number]  MEDICAL RECORD NO.:  [id number]  LOCATION:                                 FACILITY:  PHYSICIAN:  Alta Corning, M.D.   DATE OF BIRTH:  February 29, 1940  DATE OF PROCEDURE:  01/28/2016 DATE OF DISCHARGE:                              OPERATIVE REPORT   She is a 76 year old female in Orthopedic Surgery Service.  POSTOPERATIVE DIAGNOSIS:  End-stage degenerative joint disease, left knee with bone-on-bone change.  POSTOPERATIVE DIAGNOSIS:  End-stage degenerative joint disease, left knee with bone-on-bone change.  PROCEDURE:  Left total knee replacement with an Attune system size 4 narrow femur, size 4 tibia, 7 mm bridging bearing, and a 38 mm all- polyethylene patella.  SURGEON:  Alta Corning, M.D.  ASSISTANT:  Gary Fleet, P.A.  ANESTHESIA:  General.  BRIEF HISTORY:  Wanda [name] is a 76 year old female with a long history of significant complaints of left knee pain.  She had been treated conservatively for prolonged period of time.  After failure of all conservative care, she was taken to the operating room for left total knee replacement.  The patient had failed conservative care including activity modification and injection therapy.  She had a previous right total knee replacement and has done well with that.  She came to the operating room for this procedure.  DESCRIPTION OF PROCEDURE:  The patient was taken to the operating room. After adequate anesthesia was obtained with general anesthetic, the patient was placed supine on the operating table.  Left leg was prepped and draped in usual sterile fashion.  Following this, the leg was exsanguinated and blood pressure tourniquet was inflated to 300 mmHg. Following this, a midline incision was made in the subcutaneous tissue and dissected down to the level of the extensor mechanism and a medial parapatellar arthrotomy was undertaken.  Once this was  completed, attention was turned towards the left knee where medial and lateral menisci were removed.  Retropatellar fat pad was removed, synovium on the anterior aspect of the femur and anterior and posterior cruciates. Following this, an intramedullary pilot hole was drilled followed by an intramedullary rod with a 5-degree valgus inclination and 90 mm of distal bone resected.  Following this, attention was turned towards measuring the femur, it measured to a 4.  Anterior and posterior cuts were made, chamfers and box.  Attention was then turned to the tibia, which is cut perpendicular to the long axis of the tibia and the tibia sized to a 4, it was drilled and keeled.  Following this, trial of components were put in place with the 6 mm spacer feels a little loose, __________ with 7 that feels better.  We then turned to the patella, cut it down to a level of 13 mm and a 32 paddle was placed and the lugs were drilled for the patella.  At this point, the knee was put through a range of motion.  Excellent range of motion and stability were achieved. At this point, attention was turned towards the removal of the trial components.  The knee was copiously and thoroughly lavaged with pulsatile lavage, irrigation, and suctioned dry.  The final components were then cemented into place with a size 4 tibia, size 4 narrow femur, a 7 mm bridging bearing trial was placed and a 38 all-poly patella was placed and held with a clamp.  Once this was done, the cement was allowed to completely harden.  All excess bone cement had been removed. __________ tourniquet let down.  All bleeding was controlled with electrocautery and the final poly was placed, and the medial parapatellar arthrotomy was closed with 1 Vicryl running, skin with 0 and 2-0 Vicryl, and 3-0 Monocryl subcuticular.  Benzoin and Steri-Strips were applied.  Sterile compressive dressing was applied.  The patient was taken to the recovery room  and was noted to be in a satisfactory condition.  Estimated blood loss for this procedure is minimal.     Alta Corning, M.D.   ______________________________ Alta Corning, M.D.    Corliss Skains  D:  01/28/2016  T:  01/29/2016  Job:  [id number]  cc:   Alta Corning, M.D.

## 2016-01-31 NOTE — Progress Notes (Signed)
Physical Therapy Treatment Patient Details Name: Wanda [name] MRN: [id number] DOB: [birth year] Today's Date: 01/31/2016    History of Present Illness 76 y.o. female admitted to [facility] on 01/28/15 for elective L TKA.  Pt with significant PMHx of urinary urgency and leakage, HTN, DM, R TKA in 09/2015, and bil carpal tunnel release.    PT Comments    Patient making slow gains with mobility and gait.  Agree with need for SNF at d/c for continued therapy.  Follow Up Recommendations  SNF     Equipment Recommendations  None recommended by PT    Recommendations for Other Services       Precautions / Restrictions Precautions Precautions: Knee Precaution Comments: Reviewed precautions Required Braces or Orthoses: Knee Immobilizer - Left Knee Immobilizer - Left: Other (comment) ("until discontinued") Restrictions Weight Bearing Restrictions: Yes LLE Weight Bearing: Weight bearing as tolerated    Mobility  Bed Mobility               General bed mobility comments: Patient in chair  Transfers Overall transfer level: Needs assistance Equipment used: Rolling walker (2 wheeled) Transfers: Sit to/from Stand Sit to Stand: Min assist         General transfer comment: Verbal cues for hand placement.  Assist to rise to standing and to steady.  Ambulation/Gait Ambulation/Gait assistance: Min guard Ambulation Distance (Feet): 24 Feet Assistive device: Rolling walker (2 wheeled) Gait Pattern/deviations: Step-to pattern;Decreased stance time - left;Decreased step length - right;Decreased stride length;Antalgic Gait velocity: decreased Gait velocity interpretation: Below normal speed for age/gender General Gait Details: Verbal cues for sequencing and to stand upright during gait.   Stairs            Wheelchair Mobility    Modified Rankin (Stroke Patients Only)       Balance           Standing balance support: Bilateral upper extremity supported Standing  balance-Leahy Scale: Poor Standing balance comment: relies on Rw for stability                    Cognition Arousal/Alertness: Awake/alert Behavior During Therapy: WFL for tasks assessed/performed Overall Cognitive Status: Within Functional Limits for tasks assessed                      Exercises Total Joint Exercises Ankle Circles/Pumps: AROM;Both;20 reps Quad Sets: AROM;Left;10 reps;Seated Short Arc Quad: AROM;Left;10 reps;Seated Heel Slides: AAROM;Left;10 reps;Seated Hip ABduction/ADduction: AROM;Left;10 reps;Seated Long Arc Quad: AROM;Left;5 reps;Seated;Limitations Long [employer] Limitations: Limited range of motion during extension Knee Flexion: AROM;Left;5 reps;Seated Goniometric ROM: -10* extension; 50* flexion    General Comments        Pertinent Vitals/Pain Pain Assessment: 0-10 Pain Score: 7  Pain Location: Lt knee with gait Pain Descriptors / Indicators: Aching;Grimacing Pain Intervention(s): Monitored during session;Repositioned;Patient requesting pain meds-RN notified    Home Living                      Prior Function            PT Goals (current goals can now be found in the care plan section) Acute Rehab PT Goals Patient Stated Goal: to go to [facility] for rehab like she did with her other knee Progress towards PT goals: Progressing toward goals    Frequency    7X/week      PT Plan Current plan remains appropriate    Co-evaluation  End of Session Equipment Utilized During Treatment: Gait belt;Left knee immobilizer Activity Tolerance: Patient tolerated treatment well;Patient limited by fatigue Patient left: in chair;with call bell/phone within reach;with chair alarm set     Time: [id number] PT Time Calculation (min) (ACUTE ONLY): 17 min  Charges:  $Gait Training: 8-22 mins                    G Codes:      Despina Pole 02/19/2016, 4:29 PM Carita Pian. Sanjuana Kava, [place] Pager  [phone]

## 2016-01-31 NOTE — Progress Notes (Signed)
Security officer talked with Pt. Told me to pass on report to day shift nurse and give the glasses time to show up. If not they need to da safety zone portal and speak to risk mgt.

## 2016-01-31 NOTE — Progress Notes (Signed)
Subjective: 3 Days Post-Op Procedure(s) (LRB): TOTAL KNEE ARTHROPLASTY (Left) Patient reports pain as moderate.  Taking by mouth and voiding okay. Making progress with physical therapy. Denies dizziness or shortness of breath.  Objective: Vital signs in last 24 hours: Temp:  [98.5 F (36.9 C)-99.8 F (37.7 C)] 98.5 F (36.9 C) (01/22 0445) Pulse Rate:  [93-98] 96 (01/22 0445) Resp:  [16] 16 (01/22 0445) BP: (136-178)/(55-70) 178/63 (01/22 0445) SpO2:  [98 %-99 %] 98 % (01/22 0445)  Intake/Output from previous day: 01/21 0701 - 01/22 0700 In: 480 [P.O.:480] Out: -  Intake/Output this shift: No intake/output data recorded.   Recent Labs  01/29/16 0406 01/30/16 0540 01/31/16 0418  HGB 11.8* 11.0* 10.4*    Recent Labs  01/30/16 0540 01/31/16 0418  WBC 11.9* 8.1  RBC 3.77* 3.52*  HCT 32.9* 31.0*  PLT 198 201    Recent Labs  01/29/16 0406  NA 138  K 4.0  CL 105  CO2 24  BUN 8  CREATININE 0.73  GLUCOSE 262*  CALCIUM 9.2   No results for input(s): LABPT, INR in the last 72 hours. Left knee exam: Neurovascular intact Sensation intact distally Intact pulses distally Dorsiflexion/Plantar flexion intact Incision: dressing C/D/I Compartment soft  Assessment/Plan: 3 Days Post-Op Procedure(s) (LRB): TOTAL KNEE ARTHROPLASTY (Left) : Lan: Eliquis 2.5 mg twice daily for DVT prophylaxis. 3 weeks postop. Up with therapy Discharge to SNF today. Follow-up with Dr. Berenice Primas in 2 weeks.  Asjia Berrios G 01/31/2016, 8:24 AM

## 2016-01-31 NOTE — Clinical Social Work Placement (Signed)
   CLINICAL SOCIAL WORK PLACEMENT  NOTE 01/31/16 - DISCHARGED TO [facility]  Date:  01/31/2016  Patient Details  Name: Wanda [name] MRN: [id number] Date of Birth: 1940/11/06  Clinical Social Work is seeking post-discharge placement for this patient at the [place] level of care (*CSW will initial, date and re-position this form in  chart as items are completed):      Patient/family provided with [facility] Work Department's list of facilities offering this level of care within the geographic area requested by the patient (or if unable, by the patient's family).      Patient/family informed of their freedom to choose among providers that offer the needed level of care, that participate in Medicare, Medicaid or managed care program needed by the patient, have an available bed and are willing to accept the patient.      Patient/family informed of [facility]'s ownership interest in [facility] and [facility], as well as of the fact that they are under no obligation to receive care at these facilities.  PASRR submitted to EDS on       PASRR number received on 01/29/16     Existing PASRR number confirmed on       FL2 transmitted to all facilities in geographic area requested by pt/family on 01/29/16     FL2 transmitted to all facilities within larger geographic area on       Patient informed that his/her managed care company has contracts with or will negotiate with certain facilities, including the following:        Yes   Patient/family informed of bed offers received.  Patient chooses bed at [facility] at [place] recommends and patient chooses bed at      Patient to be transferred to [facility] [place] on  01/31/16.  Patient to be transferred to facility by  ambulance     Patient family notified on  01/31/16 of transfer.  Name of family member notified:   [name] - daughter by phone.     PHYSICIAN Please  prepare priority discharge summary, including medications, Please prepare prescriptions, Please sign FL2     Additional Comment:    _______________________________________________ Sable Feil, LCSW 01/31/2016, 5:51 PM

## 2016-02-01 ENCOUNTER — Encounter (HOSPITAL_COMMUNITY): Payer: Self-pay | Admitting: Orthopedic Surgery

## 2016-02-02 DIAGNOSIS — E1165 Type 2 diabetes mellitus with hyperglycemia: Secondary | ICD-10-CM | POA: Diagnosis not present

## 2016-02-02 DIAGNOSIS — I10 Essential (primary) hypertension: Secondary | ICD-10-CM | POA: Diagnosis not present

## 2016-02-02 DIAGNOSIS — M1712 Unilateral primary osteoarthritis, left knee: Secondary | ICD-10-CM | POA: Diagnosis not present

## 2016-02-02 DIAGNOSIS — Z471 Aftercare following joint replacement surgery: Secondary | ICD-10-CM | POA: Diagnosis not present

## 2016-02-02 DIAGNOSIS — R262 Difficulty in walking, not elsewhere classified: Secondary | ICD-10-CM | POA: Diagnosis not present

## 2016-02-10 ENCOUNTER — Ambulatory Visit: Payer: Medicare Other | Attending: Orthopedic Surgery | Admitting: Physical Therapy

## 2016-02-10 DIAGNOSIS — R262 Difficulty in walking, not elsewhere classified: Secondary | ICD-10-CM | POA: Diagnosis not present

## 2016-02-10 DIAGNOSIS — M25562 Pain in left knee: Secondary | ICD-10-CM | POA: Insufficient documentation

## 2016-02-10 DIAGNOSIS — M25662 Stiffness of left knee, not elsewhere classified: Secondary | ICD-10-CM | POA: Diagnosis not present

## 2016-02-10 DIAGNOSIS — M6281 Muscle weakness (generalized): Secondary | ICD-10-CM | POA: Insufficient documentation

## 2016-02-10 NOTE — Therapy (Signed)
[facility] [address]  [place], [place], [address] Phone: [phone]   Fax:  [phone]  Physical Therapy Evaluation  Patient Details  Name: Wanda [name] MRN: [id number] Date of Birth: 1940/06/09 Referring Provider: Dr. Alta Corning  Encounter Date: 02/10/2016      PT End of Session - 02/10/16 1158    Visit Number 1   Number of Visits 18   Date for PT Re-Evaluation 03/24/16   Authorization Type Medicare   PT Start Time 1045   PT Stop Time 1137   PT Time Calculation (min) 52 min   Activity Tolerance Patient tolerated treatment well;Patient limited by pain   Behavior During Therapy [facility] for tasks assessed/performed      Past Medical History:  Diagnosis Date  . Arthritis   . Cataract   . Diabetes mellitus   . Hypertension   . Skin cancer    Removed from face  . Urgency of urination   . Urinary leakage     Past Surgical History:  Procedure Laterality Date  . ABDOMINAL HYSTERECTOMY  early 80's   total  . CARPAL TUNNEL RELEASE Bilateral 1980 and 1981   both hands  . EYE SURGERY     bilateral cataracts  . Fatty Tumor Excision    . JOINT REPLACEMENT    . TONSILLECTOMY    . TONSILLECTOMY AND ADENOIDECTOMY  age 20  . TOTAL KNEE ARTHROPLASTY Right 10/04/2015  . TOTAL KNEE ARTHROPLASTY Right 10/04/2015   Procedure: TOTAL KNEE ARTHROPLASTY;  Surgeon: Dorna Leitz, MD;  Location: [facility];  Service: Orthopedics;  Laterality: Right;  . TOTAL KNEE ARTHROPLASTY Left 01/28/2016   Procedure: TOTAL KNEE ARTHROPLASTY;  Surgeon: Dorna Leitz, MD;  Location: [facility];  Service: Orthopedics;  Laterality: Left;  . TUBAL LIGATION      There were no vitals filed for this visit.       Subjective Assessment - 02/10/16 1049    Subjective s/p L TKA 01/28/16; pt reports no home health. pt reports no complications with surgery. Pt is using CPM currently 4 times per day for around 1 hour each. Pt spent 1 week at [facility].    Pertinent History R TKA 10/04/15; L TKA 01/28/16   Limitations Standing;Walking   How long can you stand comfortably? 4 minutes   How long can you walk comfortably? 6 minutes   Patient Stated Goals "to get as much range of motion as possible"   Currently in Pain? Yes   Pain Score 3   current: 3/10 following pain pill Worst: 10/10 Best: 2/10    Pain Location Knee   Pain Orientation Left   Pain Descriptors / Indicators Tightness;Aching   Pain Type Surgical pain   Pain Onset 1 to 4 weeks ago   Pain Frequency Constant   Aggravating Factors  Time   Pain Relieving Factors Pain Medicine, walking   Effect of Pain on Daily Activities standing            [facility] PT Assessment - 02/10/16 1056      Assessment   Medical Diagnosis S/P L TKA   Referring Provider Dr. Alta Corning   Onset Date/Surgical Date 01/28/16   Next MD Visit 02/14/2016   Prior Therapy SNF for 1 week     Precautions   Precautions Fall     Balance Screen   Has the patient fallen in the past 6 months No   Has the patient had  a decrease in activity level because of a fear of falling?  No   Is the patient reluctant to leave their home because of a fear of falling?  No     Home Social worker Private residence   Living Arrangements Alone   Available Help at Discharge Family   Type of [place] to enter   Entrance Stairs-Number of Steps 1   Entrance Stairs-Rails None   Home Layout One level   Decker - single point;Crutches;Walker - 4 wheels;Grab bars - tub/shower;Shower seat - built in     Prior Function   Level of Independence Independent   Vocation Retired   Advertising account planner, sewing, reading     Observation/Other Assessments   Observations Unable to assess incision and surrounding tissue of L knee due to bandage.    Focus on Therapeutic Outcomes (FOTO)  Knee: 45% (55% limitation) Predicted 62% (38% limitation)     ROM / Strength   AROM / PROM /  Strength AROM;PROM;Strength     AROM   AROM Assessment Site Knee   Right/Left Knee Right;Left   Right Knee Extension 0   Right Knee Flexion 93   Left Knee Extension 10   Left Knee Flexion 70     PROM   Right/Left Knee Right;Left   Right Knee Extension 0   Right Knee Flexion 100   Left Knee Extension 6  supported extension   Left Knee Flexion 74     Strength   Right Hip Flexion 4/5   Right Hip Extension 4/5   Right Hip ABduction 4+/5   Right Hip ADduction 4+/5   Left Hip Flexion 4-/5   Left Hip Extension 4-/5   Left Hip ABduction 4-/5   Left Hip ADduction 4/5   Right Knee Flexion 4+/5   Right Knee Extension 4+/5   Left Knee Flexion 4-/5   Left Knee Extension 4-/5     Flexibility   Soft Tissue Assessment /Muscle Length yes   Hamstrings B WFL     Palpation   Palpation comment muscle tension noted in L hamstring; mild inflammation was noted around sides and anterior L knee     Ambulation/Gait   Ambulation/Gait Assistance 6: Modified independent (Device/Increase time)   Ambulation Distance (Feet) 120 Feet   Assistive device Straight cane   Gait Pattern Step-through pattern;Decreased step length - left;Decreased hip/knee flexion - left   Ambulation Surface Level;Indoor                   [facility] Adult PT Treatment/Exercise - 02/10/16 1056      Knee/Hip Exercises: Standing   Hip Abduction AROM;Both;5 reps   Abduction Limitations pain in left knee D/C   Hip Extension AROM;Both;5 reps   Extension Limitations pain in left knee D/C   Other Standing Knee Exercises Marching with UE support at counter; 10 reps each leg   Other Standing Knee Exercises Lateral wt shifting with UE support at counter; 10 reps                 PT Education - 02/10/16 1152    Education provided Yes   Education Details Eval Findings, POC, & initial HEP   Person(s) Educated Patient   Methods Explanation;Demonstration;Handout   Comprehension Verbalized understanding;Returned  demonstration          PT Short Term Goals - 02/10/16 1249      PT SHORT TERM GOAL #1  Title pt will be independent with initial HEP by 02/25/16   Status New           PT Long Term Goals - 02/25/16 1253      PT LONG TERM GOAL #1   Title Pt will be indepedent with advanced HEP by 03/24/16   Status New     PT LONG TERM GOAL #2   Title pt will have L knee AROM 3-115 dg to allow for normal gait mechanics by 03/24/16.   Status New     PT LONG TERM GOAL #3   Title pt will have L hip and knee strength >/= 4/5 for improved function by 03/24/16.     PT LONG TERM GOAL #4   Title Pt will be able to ambulate with normal gait pattern without AD by 03/24/16.   Status New               Plan - 02/25/2016 1244    Clinical Impression Statement Wanda [name] is a 76 year old female reporting to therapy today following L TKA on 01/28/16. She reports she had no complications with surgery and spent one week in SNF. She has had no home health therapy. Due to driving limitations, she will only be able to come 2 times a week while her son in law is driving her. Once she is able to drive, she will try for 3 times per week. Upon palpation of the L knee, notable muscle tension in the L hamstring, swelling around anterior and sides of knee, and increased sensitivity around incision area. She walks with a single point cane on the R side displaying a step-through gait pattern with decreased step length and hip/knee flexion & extension on the L side. Her L knee AROM is 10 to 70 degrees with PROM being 6 to 74 degrees. Her L hip strength is an average grade of 4-/5 and her L knee extension & flexion are 4-/5. Wanda [name] will benefit from skilled therapeutic intervention to reduce L knee pain, increase L knee ROM, increase L hip and knee strength, and to improve function of her L knee.   Rehab Potential Good   Clinical Impairments Affecting Rehab Potential R TKA 10/04/15   PT Frequency 3x / week  2-3  times per week   PT Duration 6 weeks   PT Treatment/Interventions Patient/family education;ADLs/Self Care Home Management;Therapeutic exercise;Scar mobilization;Gait training;Stair training;Neuromuscular re-education;Cryotherapy;Vasopneumatic Device;Taping;Passive range of motion;Balance training;Manual techniques;Functional mobility training;Moist Heat;Iontophoresis 4mg /ml Dexamethasone   PT Next Visit Plan begin gentle knee range of motion exercises and hip & knee strengthening; Assess patella mobility if pt will tolerate; manual therapy & modalities PRN   Consulted and Agree with Plan of Care Patient      Patient will benefit from skilled therapeutic intervention in order to improve the following deficits and impairments:  Pain, Abnormal gait, Decreased activity tolerance, Decreased range of motion, Decreased strength, Difficulty walking, Increased edema, Impaired flexibility, Decreased mobility, Decreased scar mobility, Decreased balance  Visit Diagnosis: Acute pain of left knee  Stiffness of left knee, not elsewhere classified  Muscle weakness (generalized)  Difficulty in walking, not elsewhere classified      G-Codes - 02-25-2016 1256    Functional Assessment Tool Used Knee FOTO = 45% (55% limitation)    Functional Limitation Mobility: Walking and moving around   Mobility: Walking and Moving Around Current Status [id number]) At least 40 percent but less than 60 percent impaired, limited or restricted   Mobility: Walking and  Moving Around Goal Status [phone]) At least 20 percent but less than 40 percent impaired, limited or restricted       Problem List Patient Active Problem List   Diagnosis Date Noted  . Primary osteoarthritis of left knee 10/04/2015  . Osteoporosis 08/23/2015  . Multinodular goiter (nontoxic) 04/07/2014  . Diabetes mellitus type 2, uncontrolled, without complications ([place]) [id number]  . Insulin adverse reaction 09/27/2013  . Senile nuclear sclerosis 08/14/2012   . HTN, goal below 130/80 03/10/2011  . Hyperlipidemia with target LDL less than 70 03/10/2011  . Obesity (BMI 30.0-34.9) 03/10/2011  . Health care maintenance 03/10/2011    Lauralee Evener, SPT 02/10/2016, 1:33 PM  [facility] [address]  [place], [place], [address] Phone: [phone]   Fax:  [phone]  Percival Spanish, PT, MPT 02/10/16, 1:33 PM  [facility] [address]  [place], [place], [address] Phone: [phone]   Fax:  [phone]    Name: Wanda [name] MRN: [id number] Date of Birth: August 11, 1940

## 2016-02-14 NOTE — Addendum Note (Signed)
Addended by: Percival Spanish on: 02/14/2016 08:15 AM   Modules accepted: Orders

## 2016-02-15 ENCOUNTER — Ambulatory Visit: Payer: Medicare Other

## 2016-02-17 ENCOUNTER — Telehealth: Payer: Self-pay | Admitting: Family Medicine

## 2016-02-17 DIAGNOSIS — M1712 Unilateral primary osteoarthritis, left knee: Secondary | ICD-10-CM | POA: Diagnosis not present

## 2016-02-17 NOTE — Telephone Encounter (Signed)
Received records from [facility] who did her rehab, she was discharged to home on 02/08/16

## 2016-02-18 ENCOUNTER — Ambulatory Visit: Payer: Medicare Other

## 2016-02-18 DIAGNOSIS — M25562 Pain in left knee: Secondary | ICD-10-CM | POA: Diagnosis not present

## 2016-02-18 DIAGNOSIS — M25662 Stiffness of left knee, not elsewhere classified: Secondary | ICD-10-CM

## 2016-02-18 DIAGNOSIS — M6281 Muscle weakness (generalized): Secondary | ICD-10-CM

## 2016-02-18 DIAGNOSIS — R262 Difficulty in walking, not elsewhere classified: Secondary | ICD-10-CM

## 2016-02-18 NOTE — Therapy (Signed)
[facility] [address]  [place], [place], [address] Phone: [phone]   Fax:  [phone]  Physical Therapy Treatment  Patient Details  Name: Wanda [name] MRN: [id number] Date of Birth: 04/03/1940 Referring Provider: Dr. Alta Corning  Encounter Date: 02/18/2016      PT End of Session - 02/18/16 1106    Visit Number 2   Number of Visits 18   Date for PT Re-Evaluation 03/24/16   Authorization Type Medicare   PT Start Time 1058   PT Stop Time 1152   PT Time Calculation (min) 54 min   Activity Tolerance Patient tolerated treatment well;Patient limited by pain   Behavior During Therapy [facility] for tasks assessed/performed      Past Medical History:  Diagnosis Date  . Arthritis   . Cataract   . Diabetes mellitus   . Hypertension   . Skin cancer    Removed from face  . Urgency of urination   . Urinary leakage     Past Surgical History:  Procedure Laterality Date  . ABDOMINAL HYSTERECTOMY  early 80's   total  . CARPAL TUNNEL RELEASE Bilateral 1980 and 1981   both hands  . EYE SURGERY     bilateral cataracts  . Fatty Tumor Excision    . JOINT REPLACEMENT    . TONSILLECTOMY    . TONSILLECTOMY AND ADENOIDECTOMY  age 68  . TOTAL KNEE ARTHROPLASTY Right 10/04/2015  . TOTAL KNEE ARTHROPLASTY Right 10/04/2015   Procedure: TOTAL KNEE ARTHROPLASTY;  Surgeon: Dorna Leitz, MD;  Location: [facility];  Service: Orthopedics;  Laterality: Right;  . TOTAL KNEE ARTHROPLASTY Left 01/28/2016   Procedure: TOTAL KNEE ARTHROPLASTY;  Surgeon: Dorna Leitz, MD;  Location: [facility];  Service: Orthopedics;  Laterality: Left;  . TUBAL LIGATION      There were no vitals filed for this visit.      Subjective Assessment - 02/18/16 1058    Subjective Pt. reporting she started taking Percoset now for pain due to constipation with other pain med.  Pt. reporting MD f/u yesterday went well no changes.     Patient Stated Goals "to get as much  range of motion as possible"   Currently in Pain? Yes   Pain Score 4    Pain Location Knee   Pain Orientation Left   Pain Descriptors / Indicators Tightness;Aching   Pain Type Surgical pain   Pain Onset 1 to 4 weeks ago   Pain Frequency Constant   Aggravating Factors  time (nothing I do changes the pain)    Pain Relieving Factors pain medicine, walking   Effect of Pain on Daily Activities Stand less   Multiple Pain Sites No            [facility] PT Assessment - 02/18/16 1105      AROM   AROM Assessment Site Knee   Right/Left Knee Left   Left Knee Extension 10  Seated    Left Knee Flexion 84     PROM   Right/Left Knee Left   Left Knee Extension 6   Left Knee Flexion 88           [facility] Adult PT Treatment/Exercise - 02/18/16 1126      Ambulation/Gait   Ambulation/Gait Assistance 6: Modified independent (Device/Increase time)   Ambulation Distance (Feet) 180 Feet  1 sitting rest break    Assistive device Straight cane   Gait Pattern Step-through pattern;Decreased step length -  left;Decreased hip/knee flexion - left;Decreased weight shift to left;Decreased stance time - left   Ambulation Surface Level;Indoor   Gait Comments Cues required for upright posture, heel strike on L, and increased stride length     Knee/Hip Exercises: Stretches   Gastroc Stretch 1 rep;60 seconds;Left   Gastroc Stretch Limitations Prostretch     Knee/Hip Exercises: Aerobic   Nustep NuStep: level 2, 6 min      Knee/Hip Exercises: Standing   Other Standing Knee Exercises Marching with UE support at counter; 10 reps each leg  HEP review; cues for upright posture    Other Standing Knee Exercises Lateral wt. shift x 10 reps each way; cues for upright posture  HEP review; cues for upright posture     Knee/Hip Exercises: Seated   Long Arc Quad AROM;Left;1 set;Strengthening   Long Arc Quad Weight 1 lbs.   Long [employer] Limitations with adduction ball squeeze 3" x 10 reps   Ball Squeeze 5" x 10  reps      Knee/Hip Exercises: Supine   Short Arc Quad Sets AROM;1 set;Left;Strengthening   Short Arc Quad Sets Limitations 2#    Straight Leg Raises AROM;Left;1 set;15 reps   Other Supine Knee/Hip Exercises SL bridge with heels on peanut p-ball 3" 2 x 10 reps   Other Supine Knee/Hip Exercises L knee flexion with heels on peanut p-ball x 20 reps with strap assist; therapist assist into flexion stretch x last 5 reps     Knee/Hip Exercises: Sidelying   Hip ABduction AROM;Left;1 set;10 reps     Manual Therapy   Manual Therapy Joint mobilization   Joint Mobilization R knee A/P manual mobs; patellar mobs all directions with emphasis on superior/inferior for increased ROM                PT Education - 02/18/16 1154    Education Details Standing SLR flexion, abduction, extension, glute set, bridge, Supine SLR, LAQ    Person(s) Educated Patient   Methods Explanation;Demonstration;Handout;Verbal cues   Comprehension Verbalized understanding;Returned demonstration;Verbal cues required;Need further instruction          PT Short Term Goals - 02/18/16 1109      PT SHORT TERM GOAL #1   Title pt will be independent with initial HEP by 02/25/16   Status On-going           PT Long Term Goals - 02/18/16 1109      PT LONG TERM GOAL #1   Title Pt will be indepedent with advanced HEP by 03/24/16   Status On-going     PT LONG TERM GOAL #2   Title pt will have L knee AROM 3-115 dg to allow for normal gait mechanics by 03/24/16.   Status On-going     PT LONG TERM GOAL #3   Title pt will have L hip and knee strength >/= 4/5 for improved function by 03/24/16.   Status On-going     PT LONG TERM GOAL #4   Title Pt will be able to ambulate with normal gait pattern without AD by 03/24/16.   Status On-going               Plan - 02/18/16 1157    Clinical Impression Statement Pt. reporting MD f/u went well with MD changing pain meds to Percocet due to constipation with other meds.   Pt. with good tolerance for patellar mobs performed today to improve ROM with moderate patellar movement without pain increase.  Pt. able  to demo improved flexion ROM today; AROM flexion 84 dg, PROM flexion 88 dg.  Extension ROM unchanged with testing.  Gait training with SPC today to improve heel strike and promote upright posture.  Strengthening activities focusing on L quad and hip added to HEP today with pt. able to demo all without issue.  Pt. L knee swelling well controlled today with pain remaining at baseline thus ice/compression not used due to pt. request.  Will plan to add additional ROM activities to HEP prn.  Pt. will continue to benefit from further skilled therapy to improve LE strength, ROM, and functional capacity.    PT Treatment/Interventions Patient/family education;ADLs/Self Care Home Management;Therapeutic exercise;Scar mobilization;Gait training;Stair training;Neuromuscular re-education;Cryotherapy;Vasopneumatic Device;Taping;Passive range of motion;Balance training;Manual techniques;Functional mobility training;Moist Heat;Iontophoresis 4mg /ml Dexamethasone   PT Next Visit Plan Continue gentle knee range of motion exercises and hip & knee strengthening; gait training; manual therapy & modalities PRN      Patient will benefit from skilled therapeutic intervention in order to improve the following deficits and impairments:  Pain, Abnormal gait, Decreased activity tolerance, Decreased range of motion, Decreased strength, Difficulty walking, Increased edema, Impaired flexibility, Decreased mobility, Decreased scar mobility, Decreased balance  Visit Diagnosis: Acute pain of left knee  Stiffness of left knee, not elsewhere classified  Muscle weakness (generalized)  Difficulty in walking, not elsewhere classified     Problem List Patient Active Problem List   Diagnosis Date Noted  . Primary osteoarthritis of left knee 10/04/2015  . Osteoporosis 08/23/2015  . Multinodular  goiter (nontoxic) 04/07/2014  . Diabetes mellitus type 2, uncontrolled, without complications ([place]) [id number]  . Insulin adverse reaction 09/27/2013  . Senile nuclear sclerosis 08/14/2012  . HTN, goal below 130/80 03/10/2011  . Hyperlipidemia with target LDL less than 70 03/10/2011  . Obesity (BMI 30.0-34.9) 03/10/2011  . Health care maintenance 03/10/2011    Bess Harvest, PTA 02/18/16 12:15 PM   [facility] [address]  [place], [place], [address] Phone: [phone]   Fax:  [phone]  Name: Britteny Godfrey MRN: [id number] Date of Birth: [birth year]

## 2016-02-21 ENCOUNTER — Ambulatory Visit: Payer: Medicare Other | Admitting: Physical Therapy

## 2016-02-21 DIAGNOSIS — M6281 Muscle weakness (generalized): Secondary | ICD-10-CM | POA: Diagnosis not present

## 2016-02-21 DIAGNOSIS — M25662 Stiffness of left knee, not elsewhere classified: Secondary | ICD-10-CM

## 2016-02-21 DIAGNOSIS — R262 Difficulty in walking, not elsewhere classified: Secondary | ICD-10-CM | POA: Diagnosis not present

## 2016-02-21 DIAGNOSIS — M25562 Pain in left knee: Secondary | ICD-10-CM | POA: Diagnosis not present

## 2016-02-21 NOTE — Therapy (Signed)
[facility] [address]  [place], [place], [address] Phone: [phone]   Fax:  [phone]  Physical Therapy Treatment  Patient Details  Name: Wanda [name] MRN: [id number] Date of Birth: [birth year] Referring Provider: Dr. Alta Corning  Encounter Date: 02/21/2016      PT End of Session - 02/21/16 1010    Visit Number 3   Number of Visits 18   Date for PT Re-Evaluation 03/24/16   Authorization Type Medicare   PT Start Time 1010   PT Stop Time 1054   PT Time Calculation (min) 44 min   Activity Tolerance Patient tolerated treatment well   Behavior During Therapy [facility] for tasks assessed/performed      Past Medical History:  Diagnosis Date  . Arthritis   . Cataract   . Diabetes mellitus   . Hypertension   . Skin cancer    Removed from face  . Urgency of urination   . Urinary leakage     Past Surgical History:  Procedure Laterality Date  . ABDOMINAL HYSTERECTOMY  early 80's   total  . CARPAL TUNNEL RELEASE Bilateral 1980 and 1981   both hands  . EYE SURGERY     bilateral cataracts  . Fatty Tumor Excision    . JOINT REPLACEMENT    . TONSILLECTOMY    . TONSILLECTOMY AND ADENOIDECTOMY  age 78  . TOTAL KNEE ARTHROPLASTY Right 10/04/2015  . TOTAL KNEE ARTHROPLASTY Right 10/04/2015   Procedure: TOTAL KNEE ARTHROPLASTY;  Surgeon: Dorna Leitz, MD;  Location: [place];  Service: Orthopedics;  Laterality: Right;  . TOTAL KNEE ARTHROPLASTY Left 01/28/2016   Procedure: TOTAL KNEE ARTHROPLASTY;  Surgeon: Dorna Leitz, MD;  Location: [place];  Service: Orthopedics;  Laterality: Left;  . TUBAL LIGATION      There were no vitals filed for this visit.      Subjective Assessment - 02/21/16 1018    Subjective Pt noting increased pain and stiffness over the weekend, but better today. Pt reports she has started back to driving again.   Patient Stated Goals "to get as much range of motion as possible"   Currently in Pain?  Yes   Pain Score --  2-3/10   Pain Location Knee   Pain Orientation Left   Pain Descriptors / Indicators Tightness;Aching   Pain Type Surgical pain   Pain Radiating Towards n/a   Pain Onset 1 to 4 weeks ago   Pain Frequency Constant   Aggravating Factors  time   Pain Relieving Factors pain meds & muscle relaxants                         [facility] Adult PT Treatment/Exercise - 02/21/16 1010      Knee/Hip Exercises: Aerobic   Nustep lvl 3 x 6'     Knee/Hip Exercises: Standing   Heel Raises Both;10 reps;3 seconds   Heel Raises Limitations UE support on back of chair   Hip Flexion Both;10 reps;Knee bent   Hip Flexion Limitations marching, UE support on back of chair   Terminal Knee Extension Left;10 reps   Terminal Knee Extension Limitations 5" hold with ball on wall   Hip Abduction Both;10 reps;Knee straight   Abduction Limitations UE support on back of chair   Hip Extension Both;10 reps;Knee straight   Extension Limitations UE support on back of chair     Knee/Hip Exercises: Seated   Long Arc [employer]  Left;15 reps;Weights   Long Arc Quad Weight 2 lbs.   Other Seated Knee/Hip Exercises Fitter Leg Press (2 blue) 15x3"   Hamstring Curl Left;15 reps   Hamstring Limitations green TB     Knee/Hip Exercises: Supine   Quad Sets Both;15 reps   Quad Sets Limitations + glut set, alt isometric into peanut ball with 5" hold   Short Arc Quad Sets Left;15 reps   Short Arc Quad Sets Limitations 2#   Bridges Both;15 reps   Bridges Limitations 3-5" hold   Straight Leg Raises Left;15 reps   Straight Leg Raises Limitations 2#   Knee Flexion Left;AAROM;10 reps;2 sets   Knee Flexion Limitations heels on peanut ball, 2nd set with manual overpressure from PT     Manual Therapy   Manual Therapy Joint mobilization   Joint Mobilization R knee A/P manual mobs; patellar mobs all directions with emphasis on superior/inferior for increased ROM                  PT Short Term  Goals - 02/21/16 1054      PT SHORT TERM GOAL #1   Title pt will be independent with initial HEP by 02/25/16   Status Achieved           PT Long Term Goals - 02/18/16 1109      PT LONG TERM GOAL #1   Title Pt will be indepedent with advanced HEP by 03/24/16   Status On-going     PT LONG TERM GOAL #2   Title pt will have L knee AROM 3-115 dg to allow for normal gait mechanics by 03/24/16.   Status On-going     PT LONG TERM GOAL #3   Title pt will have L hip and knee strength >/= 4/5 for improved function by 03/24/16.   Status On-going     PT LONG TERM GOAL #4   Title Pt will be able to ambulate with normal gait pattern without AD by 03/24/16.   Status On-going               Plan - 02/21/16 1057    Clinical Impression Statement HEP reviewed with pt able to demostrate all exercises with good technique and pt denies any issues with HEP performance at home. Pt demostrating better tolerance for weightbearing exercises today as well as alllowing PT to touch knee to perform patellar and knee joint mobs to promote increased ROM. Pt prefering to ice at home, therefore ice/vaso deferred.   Rehab Potential Good   Clinical Impairments Affecting Rehab Potential R TKA 10/04/15   PT Treatment/Interventions Patient/family education;ADLs/Self Care Home Management;Therapeutic exercise;Scar mobilization;Gait training;Stair training;Neuromuscular re-education;Cryotherapy;Vasopneumatic Device;Taping;Passive range of motion;Balance training;Manual techniques;Functional mobility training;Moist Heat;Iontophoresis 4mg /ml Dexamethasone   PT Next Visit Plan Continue gentle knee range of motion exercises and hip & knee strengthening; gait training; manual therapy & modalities PRN      Patient will benefit from skilled therapeutic intervention in order to improve the following deficits and impairments:  Pain, Abnormal gait, Decreased activity tolerance, Decreased range of motion, Decreased strength,  Difficulty walking, Increased edema, Impaired flexibility, Decreased mobility, Decreased scar mobility, Decreased balance  Visit Diagnosis: Acute pain of left knee  Stiffness of left knee, not elsewhere classified  Muscle weakness (generalized)  Difficulty in walking, not elsewhere classified     Problem List Patient Active Problem List   Diagnosis Date Noted  . Primary osteoarthritis of left knee 10/04/2015  . Osteoporosis 08/23/2015  . Multinodular goiter (nontoxic)  04/07/2014  . Diabetes mellitus type 2, uncontrolled, without complications ([place]) [id number]  . Insulin adverse reaction 09/27/2013  . Senile nuclear sclerosis 08/14/2012  . HTN, goal below 130/80 03/10/2011  . Hyperlipidemia with target LDL less than 70 03/10/2011  . Obesity (BMI 30.0-34.9) 03/10/2011  . Health care maintenance 03/10/2011    Percival Spanish, PT, MPT 02/21/2016, 11:08 AM  [facility] [address]  [place], [place], [address] Phone: [phone]   Fax:  [phone]  Name: Wanda [name] MRN: [id number] Date of Birth: Jun 02, 1940

## 2016-02-22 ENCOUNTER — Ambulatory Visit: Payer: Medicare Other | Admitting: Physical Therapy

## 2016-02-23 ENCOUNTER — Ambulatory Visit: Payer: Medicare Other | Admitting: Physical Therapy

## 2016-02-23 DIAGNOSIS — R262 Difficulty in walking, not elsewhere classified: Secondary | ICD-10-CM | POA: Diagnosis not present

## 2016-02-23 DIAGNOSIS — M6281 Muscle weakness (generalized): Secondary | ICD-10-CM

## 2016-02-23 DIAGNOSIS — M25662 Stiffness of left knee, not elsewhere classified: Secondary | ICD-10-CM

## 2016-02-23 DIAGNOSIS — M25562 Pain in left knee: Secondary | ICD-10-CM

## 2016-02-23 NOTE — Therapy (Signed)
[facility] [address]  [place], [place], [address] Phone: [phone]   Fax:  [phone]  Physical Therapy Treatment  Patient Details  Name: Wanda [name] MRN: [id number] Date of Birth: Mar 27, 1940 Referring Provider: Dr. Alta Corning  Encounter Date: 02/23/2016      PT End of Session - 02/23/16 1103    Visit Number 4   Number of Visits 18   Date for PT Re-Evaluation 03/24/16   Authorization Type Medicare   PT Start Time 1100   PT Stop Time 1145   PT Time Calculation (min) 45 min   Activity Tolerance Patient tolerated treatment well;No increased pain   Behavior During Therapy WFL for tasks assessed/performed      Past Medical History:  Diagnosis Date  . Arthritis   . Cataract   . Diabetes mellitus   . Hypertension   . Skin cancer    Removed from face  . Urgency of urination   . Urinary leakage     Past Surgical History:  Procedure Laterality Date  . ABDOMINAL HYSTERECTOMY  early 80's   total  . CARPAL TUNNEL RELEASE Bilateral 1980 and 1981   both hands  . EYE SURGERY     bilateral cataracts  . Fatty Tumor Excision    . JOINT REPLACEMENT    . TONSILLECTOMY    . TONSILLECTOMY AND ADENOIDECTOMY  age 35  . TOTAL KNEE ARTHROPLASTY Right 10/04/2015  . TOTAL KNEE ARTHROPLASTY Right 10/04/74   Procedure: TOTAL KNEE ARTHROPLASTY;  Surgeon: Dorna Leitz, MD;  Location: [facility];  Service: Orthopedics;  Laterality: Right;  . TOTAL KNEE ARTHROPLASTY Left 01/27/74   Procedure: TOTAL KNEE ARTHROPLASTY;  Surgeon: Dorna Leitz, MD;  Location: [place] [facility];  Service: Orthopedics;  Laterality: Left;  . TUBAL LIGATION      There were no vitals filed for this visit.      Subjective Assessment - 02/23/16 1101    Subjective Pt reporting she has been having constant L knee pain but is starting to improve. She reports the same thing happened with her R knee. Pt reports she has been able to do her HEP but was unable to  yesterday due to pain level.    Pertinent History R TKA 10/04/15; L TKA 01/28/16   Patient Stated Goals "to get as much range of motion as possible"   Currently in Pain? Yes   Pain Score 4    Pain Location Knee   Pain Orientation Left            [facility] PT Assessment - 02/23/16 0001      AROM   Left Knee Flexion 91     PROM   Left Knee Extension 6  supported extension   Left Knee Flexion 95                     [facility] Adult PT Treatment/Exercise - 02/23/16 0001      Knee/Hip Exercises: Stretches   Other Knee/Hip Stretches Seated with strap; Gastroc/hamstring stretch 2 reps x 30"     Knee/Hip Exercises: Aerobic   Nustep lvl 3 x 6'     Knee/Hip Exercises: Standing   Heel Raises Both;15 reps;3 seconds   Heel Raises Limitations UE support at counter   Knee Flexion Both;15 reps   Knee Flexion Limitations at counter for UE support; 2# ankle weight   Hip Abduction Both;10 reps;Knee straight   Abduction Limitations UE support at counter;  2# ankle weight   Hip Extension Both;10 reps;Knee straight   Extension Limitations UE support at counter; 2# ankle weight     Knee/Hip Exercises: Supine   Quad Sets Both;15 reps   Quad Sets Limitations + glute set, alt isometric into peanut ball; 5" holds   Knee Flexion Left;AAROM;10 reps;2 sets   Knee Flexion Limitations heels on peanut ball, 2nd set with manual overpressure from PT     Manual Therapy   Manual Therapy Joint mobilization   Manual therapy comments L Supine hamstring & gastroc stretch; 3 reps x 30"    Joint Mobilization L patellar mobs all directions with emphasis on superior/inferior for increased ROM                PT Education - 02/23/16 1148    Education provided Yes   Education Details Gastroc/Hamstring Forensic psychologist) Educated Patient   Methods Explanation;Demonstration;Handout   Comprehension Verbalized understanding;Returned demonstration          PT Short Term Goals - 02/21/16 1054       PT SHORT TERM GOAL #1   Title pt will be independent with initial HEP by 02/25/16   Status Achieved           PT Long Term Goals - 02/18/16 1109      PT LONG TERM GOAL #1   Title Pt will be indepedent with advanced HEP by 03/24/16   Status On-going     PT LONG TERM GOAL #2   Title pt will have L knee AROM 3-115 dg to allow for normal gait mechanics by 03/24/16.   Status On-going     PT LONG TERM GOAL #3   Title pt will have L hip and knee strength >/= 4/5 for improved function by 03/24/16.   Status On-going     PT LONG TERM GOAL #4   Title Pt will be able to ambulate with normal gait pattern without AD by 03/24/16.   Status On-going               Plan - 02/23/16 1103    Clinical Impression Statement Pt reporting to therapy stating she is in a phase of constant pain. This happened with her R TKA and she feels she is starting to get over this phase. She continues to report good compliance with her HEP. She was able to tolerate patella mobilizations with some areas of her anterior thigh still being sensitive to the touch. She was able to continue with progressions of her hip & ankle strengthening. Pt is continuing to show improvement in her L knee AROM & PROM. She will continue to benefit from skilled therapy to improve L knee ROM, increase L LE strength, reduce pain, & improve overall LE function.    Rehab Potential Good   Clinical Impairments Affecting Rehab Potential R TKA 10/04/15   PT Treatment/Interventions Patient/family education;ADLs/Self Care Home Management;Therapeutic exercise;Scar mobilization;Gait training;Stair training;Neuromuscular re-education;Cryotherapy;Vasopneumatic Device;Taping;Passive range of motion;Balance training;Manual techniques;Functional mobility training;Moist Heat;Iontophoresis 4mg /ml Dexamethasone   PT Next Visit Plan Continue knee range of motion exercises and hip & knee strengthening; gait training; manual therapy & modalities PRN    Consulted and Agree with Plan of Care Patient      Patient will benefit from skilled therapeutic intervention in order to improve the following deficits and impairments:  Pain, Abnormal gait, Decreased activity tolerance, Decreased range of motion, Decreased strength, Difficulty walking, Increased edema, Impaired flexibility, Decreased mobility, Decreased scar mobility, Decreased balance  Visit  Diagnosis: Acute pain of left knee  Stiffness of left knee, not elsewhere classified  Muscle weakness (generalized)  Difficulty in walking, not elsewhere classified     Problem List Patient Active Problem List   Diagnosis Date Noted  . Primary osteoarthritis of left knee 10/04/2015  . Osteoporosis 08/23/2015  . Multinodular goiter (nontoxic) 04/07/2014  . Diabetes mellitus type 2, uncontrolled, without complications ([place]) [id number]  . Insulin adverse reaction 09/27/2013  . Senile nuclear sclerosis 08/14/2012  . HTN, goal below 130/80 03/10/2011  . Hyperlipidemia with target LDL less than 70 03/10/2011  . Obesity (BMI 30.0-34.9) 03/10/2011  . Health care maintenance 03/10/2011    Lauralee Evener, SPT 02/23/2016, 1:20 PM  [facility] [address]  [place], [place], [address] Phone: [phone]   Fax:  [phone]  Name: Kelene Mouradian MRN: [id number] Date of Birth: January 18, 1940

## 2016-02-25 ENCOUNTER — Ambulatory Visit: Payer: Medicare Other

## 2016-02-25 DIAGNOSIS — M25562 Pain in left knee: Secondary | ICD-10-CM | POA: Diagnosis not present

## 2016-02-25 DIAGNOSIS — M6281 Muscle weakness (generalized): Secondary | ICD-10-CM | POA: Diagnosis not present

## 2016-02-25 DIAGNOSIS — R262 Difficulty in walking, not elsewhere classified: Secondary | ICD-10-CM

## 2016-02-25 DIAGNOSIS — M25662 Stiffness of left knee, not elsewhere classified: Secondary | ICD-10-CM | POA: Diagnosis not present

## 2016-02-25 NOTE — Therapy (Signed)
[facility] [address]  [place], [place], [address] Phone: [phone]   Fax:  [phone]  Physical Therapy Treatment  Patient Details  Name: Wanda [name] MRN: [id number] Date of Birth: [birth year] Referring Provider: Dr. Alta Corning  Encounter Date: 02/25/2016      PT End of Session - 02/25/16 1104    Visit Number 5   Number of Visits 18   Date for PT Re-Evaluation 03/24/16   Authorization Type Medicare   PT Start Time 1100   PT Stop Time 1148   PT Time Calculation (min) 48 min   Activity Tolerance Patient tolerated treatment well;No increased pain   Behavior During Therapy WFL for tasks assessed/performed      Past Medical History:  Diagnosis Date  . Arthritis   . Cataract   . Diabetes mellitus   . Hypertension   . Skin cancer    Removed from face  . Urgency of urination   . Urinary leakage     Past Surgical History:  Procedure Laterality Date  . ABDOMINAL HYSTERECTOMY  early 80's   total  . CARPAL TUNNEL RELEASE Bilateral 1980 and 1981   both hands  . EYE SURGERY     bilateral cataracts  . Fatty Tumor Excision    . JOINT REPLACEMENT    . TONSILLECTOMY    . TONSILLECTOMY AND ADENOIDECTOMY  age 68  . TOTAL KNEE ARTHROPLASTY Right 10/04/2015  . TOTAL KNEE ARTHROPLASTY Right 10/04/2015   Procedure: TOTAL KNEE ARTHROPLASTY;  Surgeon: Dorna Leitz, MD;  Location: [place] [facility];  Service: Orthopedics;  Laterality: Right;  . TOTAL KNEE ARTHROPLASTY Left 01/28/2016   Procedure: TOTAL KNEE ARTHROPLASTY;  Surgeon: Dorna Leitz, MD;  Location: [facility];  Service: Orthopedics;  Laterality: Left;  . TUBAL LIGATION      There were no vitals filed for this visit.      Subjective Assessment - 02/25/16 1102    Subjective Pt. reporting improved L knee pain today stating, "I think i'm over the worst of it".     Patient Stated Goals "to get as much range of motion as possible"   Currently in Pain? Yes   Pain Score  3    Pain Location Knee   Pain Orientation Left   Pain Descriptors / Indicators Tightness;Aching   Pain Type Surgical pain   Pain Onset 1 to 4 weeks ago   Pain Frequency Constant   Aggravating Factors  time    Effect of Pain on Daily Activities sits more at home    Multiple Pain Sites No            [facility] PT Assessment - 02/25/16 1128      Assessment   Medical Diagnosis S/P L TKA   Referring Provider Dr. Alta Corning   Onset Date/Surgical Date 01/28/16   Next MD Visit 3.1.18   Prior Therapy SNF for 1 week           [facility] Adult PT Treatment/Exercise - 02/25/16 1107      Knee/Hip Exercises: Stretches   Passive Hamstring Stretch 2 reps;30 seconds;Left   Passive Hamstring Stretch Limitations + gastroc stretch with therapist    Gastroc Stretch 1 rep;60 seconds;Left   Gastroc Stretch Limitations Prostretch     Knee/Hip Exercises: Aerobic   Nustep lvl 4 x 8'  seat from 6>4 on last two minutes for flexion stretch      Knee/Hip Exercises: Standing   Heel Raises  Both;15 reps;3 seconds   Heel Raises Limitations UE support at counter; with L wt. shift due to pt. unable to tolerate L eccentric    Terminal Knee Extension 15 reps;Left;2 sets   Terminal Knee Extension Limitations 5" TKE with blue TB in door; 2nd set with 5" hold with ball on wall   Functional Squat 1 set;15 reps  B UE support; no hold at bottom    Functional Squat Limitations at counter      Knee/Hip Exercises: Supine   Knee Flexion Left;AAROM;10 reps;1 set   Knee Flexion Limitations All with manual overpressure from therapist   Other Supine Knee/Hip Exercises SL bridge with heels on peanut p-ball 5" x 10 reps     Manual Therapy   Manual Therapy Joint mobilization;Passive ROM   Manual therapy comments L Supine hamstring & gastroc stretch; 3 reps x 30"    Joint Mobilization L patellar mobs all directions with emphasis on superior/inferior for increased ROM; L knee flexion/extension mobs    Passive ROM  Gentle flexion/extension stretch with therapist                 PT Education - 02/25/16 1149    Education provided Yes   Education Details Seated knee extension stretch LLLD   Person(s) Educated Patient   Methods Explanation;Demonstration;Handout;Verbal cues   Comprehension Verbalized understanding;Returned demonstration;Verbal cues required          PT Short Term Goals - 02/21/16 1054      PT SHORT TERM GOAL #1   Title pt will be independent with initial HEP by 02/25/16   Status Achieved           PT Long Term Goals - 02/18/16 1109      PT LONG TERM GOAL #1   Title Pt will be indepedent with advanced HEP by 03/24/16   Status On-going     PT LONG TERM GOAL #2   Title pt will have L knee AROM 3-115 dg to allow for normal gait mechanics by 03/24/16.   Status On-going     PT LONG TERM GOAL #3   Title pt will have L hip and knee strength >/= 4/5 for improved function by 03/24/16.   Status On-going     PT LONG TERM GOAL #4   Title Pt will be able to ambulate with normal gait pattern without AD by 03/24/16.   Status On-going               Plan - 02/25/16 1156    Clinical Impression Statement Pt. reporting L knee pain is less today.  Today's treatment with manual focus on extension/flexion mobs, patellar mobs, and gentle stretching to improve ROM.  Functional squat added today and tolerated well.  TKE strengthening L quad progressed today with pt. reporting good fatigue level to end treatment.  Pt. declining ice to end treatment today with only mild pain increase following therex.  Pt. will continue to benefit from further skilled therapy to improve ROM, strength, and functional capacity.     PT Treatment/Interventions Patient/family education;ADLs/Self Care Home Management;Therapeutic exercise;Scar mobilization;Gait training;Stair training;Neuromuscular re-education;Cryotherapy;Vasopneumatic Device;Taping;Passive range of motion;Balance training;Manual  techniques;Functional mobility training;Moist Heat;Iontophoresis 4mg /ml Dexamethasone   PT Next Visit Plan Continue knee range of motion exercises and hip & knee strengthening; gait training; manual therapy & modalities PRN      Patient will benefit from skilled therapeutic intervention in order to improve the following deficits and impairments:  Pain, Abnormal gait, Decreased activity tolerance, Decreased range  of motion, Decreased strength, Difficulty walking, Increased edema, Impaired flexibility, Decreased mobility, Decreased scar mobility, Decreased balance  Visit Diagnosis: Acute pain of left knee  Stiffness of left knee, not elsewhere classified  Muscle weakness (generalized)  Difficulty in walking, not elsewhere classified     Problem List Patient Active Problem List   Diagnosis Date Noted  . Primary osteoarthritis of left knee 10/04/2015  . Osteoporosis 08/23/2015  . Multinodular goiter (nontoxic) 04/07/2014  . Diabetes mellitus type 2, uncontrolled, without complications ([place]) [id number]  . Insulin adverse reaction 09/27/2013  . Senile nuclear sclerosis 08/14/2012  . HTN, goal below 130/80 03/10/2011  . Hyperlipidemia with target LDL less than 70 03/10/2011  . Obesity (BMI 30.0-34.9) 03/10/2011  . Health care maintenance 03/10/2011    Bess Harvest, PTA 02/25/16 12:03 PM  [facility] [address]  [place], [place], [address] Phone: [phone]   Fax:  [phone]  Name: Wanda [name] MRN: [id number] Date of Birth: 09-May-1940

## 2016-02-28 ENCOUNTER — Ambulatory Visit: Payer: Medicare Other

## 2016-02-28 DIAGNOSIS — M25562 Pain in left knee: Secondary | ICD-10-CM | POA: Diagnosis not present

## 2016-02-28 DIAGNOSIS — M25662 Stiffness of left knee, not elsewhere classified: Secondary | ICD-10-CM

## 2016-02-28 DIAGNOSIS — M6281 Muscle weakness (generalized): Secondary | ICD-10-CM | POA: Diagnosis not present

## 2016-02-28 DIAGNOSIS — R262 Difficulty in walking, not elsewhere classified: Secondary | ICD-10-CM

## 2016-02-28 NOTE — Therapy (Signed)
[facility] [address]  [place], [place], [address] Phone: [phone]   Fax:  [phone]  Physical Therapy Treatment  Patient Details  Name: Wanda [name] MRN: [id number] Date of Birth: 12/22/1940 Referring Provider: Dr. Alta Corning  Encounter Date: 02/28/2016      PT End of Session - 02/28/16 1405    Visit Number 6   Number of Visits 18   Date for PT Re-Evaluation 03/24/16   Authorization Type Medicare   PT Start Time [id number]   PT Stop Time [id number]   PT Time Calculation (min) 44 min   Activity Tolerance Patient tolerated treatment well;No increased pain   Behavior During Therapy WFL for tasks assessed/performed      Past Medical History:  Diagnosis Date  . Arthritis   . Cataract   . Diabetes mellitus   . Hypertension   . Skin cancer    Removed from face  . Urgency of urination   . Urinary leakage     Past Surgical History:  Procedure Laterality Date  . ABDOMINAL HYSTERECTOMY  early 80's   total  . CARPAL TUNNEL RELEASE Bilateral 1980 and 1981   both hands  . EYE SURGERY     bilateral cataracts  . Fatty Tumor Excision    . JOINT REPLACEMENT    . TONSILLECTOMY    . TONSILLECTOMY AND ADENOIDECTOMY  age 28  . TOTAL KNEE ARTHROPLASTY Right 10/04/2015  . TOTAL KNEE ARTHROPLASTY Right 10/04/2015   Procedure: TOTAL KNEE ARTHROPLASTY;  Surgeon: Dorna Leitz, MD;  Location: [facility];  Service: Orthopedics;  Laterality: Right;  . TOTAL KNEE ARTHROPLASTY Left 01/28/2016   Procedure: TOTAL KNEE ARTHROPLASTY;  Surgeon: Dorna Leitz, MD;  Location: [facility];  Service: Orthopedics;  Laterality: Left;  . TUBAL LIGATION      There were no vitals filed for this visit.      Subjective Assessment - 02/28/16 1405    Subjective Pt. reporting higher pain levels earlier today due to being out shopping.     Patient Stated Goals "to get as much range of motion as possible"   Currently in Pain? Yes   Pain Score 5    Pain  Location Knee   Pain Orientation Left   Pain Descriptors / Indicators Tightness;Aching   Pain Type Surgical pain   Pain Radiating Towards n/a   Pain Onset More than a month ago   Pain Frequency Constant   Multiple Pain Sites No            [facility] Adult PT Treatment/Exercise - 02/28/16 1421      Ambulation/Gait   Ambulation/Gait Assistance 6: Modified independent (Device/Increase time)   Ambulation Distance (Feet) 300 Feet   Assistive device Straight cane   Gait Pattern Step-through pattern;Decreased step length - left;Decreased hip/knee flexion - left;Decreased weight shift to left;Decreased stance time - left   Ambulation Surface Level;Indoor   Gait Comments Cues required for upright posture, heel strike on L, and increased step length     Knee/Hip Exercises: Stretches   Passive Hamstring Stretch 2 reps;30 seconds;Left   Passive Hamstring Stretch Limitations + gastroc stretch with therapist    Gastroc Stretch 1 rep;60 seconds;Left  increased pain with this today   Gastroc Stretch Limitations Prostretch     Knee/Hip Exercises: Machines for Strengthening   Cybex Knee Flexion 25# B con/ecc x 15 reps; 20# B con/L ecc x 10 rep     Knee/Hip Exercises: Standing  Heel Raises Both;15 reps;3 seconds   Heel Raises Limitations UE support at counter; with L wt. shift     Terminal Knee Extension 15 reps;Left;1 set   Terminal Knee Extension Limitations 5" TKE with ball squeeze on wall     Knee/Hip Exercises: Supine   Knee Flexion Left;AAROM;1 set;15 reps   Knee Flexion Limitations With manual overpressure from therapist   Other Supine Knee/Hip Exercises SL bridge with heels on peanut p-ball 5" x 12 reps     Manual Therapy   Manual Therapy Joint mobilization;Passive ROM   Manual therapy comments L Supine hamstring & gastroc stretch; 3 reps x 30"    Joint Mobilization L patellar mobs all directions with emphasis on superior/inferior for increased ROM; L knee flexion/extension mobs     Passive ROM Gentle flexion/extension stretch with therapist             PT Short Term Goals - 02/21/16 1054      PT SHORT TERM GOAL #1   Title pt will be independent with initial HEP by 02/25/16   Status Achieved           PT Long Term Goals - 02/18/16 1109      PT LONG TERM GOAL #1   Title Pt will be indepedent with advanced HEP by 03/24/16   Status On-going     PT LONG TERM GOAL #2   Title pt will have L knee AROM 3-115 dg to allow for normal gait mechanics by 03/24/16.   Status On-going     PT LONG TERM GOAL #3   Title pt will have L hip and knee strength >/= 4/5 for improved function by 03/24/16.   Status On-going     PT LONG TERM GOAL #4   Title Pt will be able to ambulate with normal gait pattern without AD by 03/24/16.   Status On-going               Plan - 02/28/16 1407    Clinical Impression Statement Pt. with increased L knee pain today stating, "I've been out all day today shopping".  Pt. tolerating mild progression in standing hip/knee strengthening activity well with mild pain increase.  Continued manual stretching, patellar mobs, and joint mobs performed today to improve ROM.  ROM not measured today however pt. seems to have improve extension ROM.  Pt. still requiring cueing with gait training for increased step length, upright posture, and heel stroke on L LE.  Ice declined today with pt. stating she will applied ice to knee at home.  Pt. will continue to benefit from further skilled therapy to improve LE strength, ROM, and functional capacity.      PT Treatment/Interventions Patient/family education;ADLs/Self Care Home Management;Therapeutic exercise;Scar mobilization;Gait training;Stair training;Neuromuscular re-education;Cryotherapy;Vasopneumatic Device;Taping;Passive range of motion;Balance training;Manual techniques;Functional mobility training;Moist Heat;Iontophoresis 4mg /ml Dexamethasone   PT Next Visit Plan Continue knee range of motion exercises and  hip & knee strengthening; gait training; manual therapy & modalities PRN      Patient will benefit from skilled therapeutic intervention in order to improve the following deficits and impairments:  Pain, Abnormal gait, Decreased activity tolerance, Decreased range of motion, Decreased strength, Difficulty walking, Increased edema, Impaired flexibility, Decreased mobility, Decreased scar mobility, Decreased balance  Visit Diagnosis: Acute pain of left knee  Stiffness of left knee, not elsewhere classified  Muscle weakness (generalized)  Difficulty in walking, not elsewhere classified     Problem List Patient Active Problem List   Diagnosis Date Noted  .  Primary osteoarthritis of left knee 10/04/2015  . Osteoporosis 08/23/2015  . Multinodular goiter (nontoxic) 04/07/2014  . Diabetes mellitus type 2, uncontrolled, without complications ([place]) [id number]  . Insulin adverse reaction 09/27/2013  . Senile nuclear sclerosis 08/14/2012  . HTN, goal below 130/80 03/10/2011  . Hyperlipidemia with target LDL less than 70 03/10/2011  . Obesity (BMI 30.0-34.9) 03/10/2011  . Health care maintenance 03/10/2011    Bess Harvest, PTA 02/28/16 3:00 PM  [facility] [address]  [place], [place], [address] Phone: [phone]   Fax:  [phone]  Name: Wanda [name] MRN: [id number] Date of Birth: [birth year]

## 2016-02-29 ENCOUNTER — Ambulatory Visit: Payer: Medicare Other | Admitting: Physical Therapy

## 2016-03-01 ENCOUNTER — Ambulatory Visit: Payer: Medicare Other

## 2016-03-01 DIAGNOSIS — M25562 Pain in left knee: Secondary | ICD-10-CM | POA: Diagnosis not present

## 2016-03-01 DIAGNOSIS — M25662 Stiffness of left knee, not elsewhere classified: Secondary | ICD-10-CM

## 2016-03-01 DIAGNOSIS — M6281 Muscle weakness (generalized): Secondary | ICD-10-CM

## 2016-03-01 DIAGNOSIS — R262 Difficulty in walking, not elsewhere classified: Secondary | ICD-10-CM | POA: Diagnosis not present

## 2016-03-01 NOTE — Therapy (Signed)
[facility] [address]  [place], [place], [address] Phone: [phone]   Fax:  [phone]  Physical Therapy Treatment  Patient Details  Name: Wanda [name] MRN: [id number] Date of Birth: [birth year] Referring Provider: Dr. Alta Corning  Encounter Date: 03/01/2016      PT End of Session - 03/01/16 1059    Visit Number 7   Number of Visits 18   Date for PT Re-Evaluation 03/24/16   Authorization Type Medicare   PT Start Time 1055   PT Stop Time 1144   PT Time Calculation (min) 49 min   Activity Tolerance Patient tolerated treatment well;No increased pain   Behavior During Therapy WFL for tasks assessed/performed      Past Medical History:  Diagnosis Date  . Arthritis   . Cataract   . Diabetes mellitus   . Hypertension   . Skin cancer    Removed from face  . Urgency of urination   . Urinary leakage     Past Surgical History:  Procedure Laterality Date  . ABDOMINAL HYSTERECTOMY  early 80's   total  . CARPAL TUNNEL RELEASE Bilateral 1980 and 1981   both hands  . EYE SURGERY     bilateral cataracts  . Fatty Tumor Excision    . JOINT REPLACEMENT    . TONSILLECTOMY    . TONSILLECTOMY AND ADENOIDECTOMY  age 76  . TOTAL KNEE ARTHROPLASTY Right 10/04/2015  . TOTAL KNEE ARTHROPLASTY Right 10/04/2015   Procedure: TOTAL KNEE ARTHROPLASTY;  Surgeon: Dorna Leitz, MD;  Location: [facility];  Service: Orthopedics;  Laterality: Right;  . TOTAL KNEE ARTHROPLASTY Left 01/28/2016   Procedure: TOTAL KNEE ARTHROPLASTY;  Surgeon: Dorna Leitz, MD;  Location: [facility];  Service: Orthopedics;  Laterality: Left;  . TUBAL LIGATION      There were no vitals filed for this visit.      Subjective Assessment - 03/01/16 1058    Subjective Pt. reporting pain is much better today following good sleep and pain meds.      Patient Stated Goals "to get as much range of motion as possible"   Currently in Pain? Yes   Pain Score 3    Pain Location Knee   Pain Orientation Left   Pain Descriptors / Indicators Tightness  Nuisance    Pain Type Surgical pain   Multiple Pain Sites No            [facility] PT Assessment - 03/01/16 1123      AROM   AROM Assessment Site Knee   Right/Left Knee Left   Left Knee Extension 9   Left Knee Flexion 98     PROM   Right/Left Knee Left   Left Knee Extension 4  with therapist overpressure in supine    Left Knee Flexion 103 dg             [facility] Adult PT Treatment/Exercise - 03/01/16 1109      Knee/Hip Exercises: Stretches   Passive Hamstring Stretch 2 reps;30 seconds;Left   Passive Hamstring Stretch Limitations + gastroc stretch with therapist    Gastroc Stretch 1 rep;60 seconds;Left   Gastroc Stretch Limitations Prostretch     Knee/Hip Exercises: Aerobic   Stationary Bike Stationary bike: level 1, 6 min half revolutions for ROM      Knee/Hip Exercises: Standing   Heel Raises Both;3 seconds;20 reps   Heel Raises Limitations UE support at counter; with L wt. shift  Terminal Knee Extension 15 reps;Left;1 set   Terminal Knee Extension Limitations 5" TKE with ball squeeze on wall   Functional Squat 1 set;15 reps  only min cueing for wt. shift today   Functional Squat Limitations at counter      Knee/Hip Exercises: Supine   Knee Flexion Left;AAROM;1 set;15 reps   Knee Flexion Limitations 5" with manual overpressure from therapist   Other Supine Knee/Hip Exercises SL bridge with heels on peanut p-ball 5" x 15 reps     Manual Therapy   Manual Therapy Joint mobilization;Passive ROM   Manual therapy comments L Supine hamstring & gastroc stretch; 3 reps x 30"    Joint Mobilization L knee ant/posterior mobs to improve ROM   Passive ROM Gentle flexion/extension stretch with therapist              PT Education - 03/01/16 1146    Education provided Yes   Education Details counter squat    Person(s) Educated Patient   Methods  Explanation;Demonstration;Handout;Verbal cues   Comprehension Verbalized understanding;Returned demonstration;Verbal cues required;Need further instruction          PT Short Term Goals - 02/21/16 1054      PT SHORT TERM GOAL #1   Title pt will be independent with initial HEP by 02/25/16   Status Achieved           PT Long Term Goals - 02/18/16 1109      PT LONG TERM GOAL #1   Title Pt will be indepedent with advanced HEP by 03/24/16   Status On-going     PT LONG TERM GOAL #2   Title pt will have L knee AROM 3-115 dg to allow for normal gait mechanics by 03/24/16.   Status On-going     PT LONG TERM GOAL #3   Title pt will have L hip and knee strength >/= 4/5 for improved function by 03/24/16.   Status On-going     PT LONG TERM GOAL #4   Title Pt will be able to ambulate with normal gait pattern without AD by 03/24/16.   Status On-going               Plan - 03/01/16 1100    Clinical Impression Statement Pt. with decreased pain today following report of better sleep and taking pain meds.  Increased focus today on knee extension ROM with joint mobs, contract/relax stretching to HS, and extension stretching with therapist.  Pt. demonstrating improved ROM today; L knee PROM 4-103 dg, AROM 9-98 dg.  Quad/VMO strengthening with slight progression today.  Pt. demonstrating improved technique with functional squatting today thus counter squat issue to pt. via handout.  Pt. declining ice to end treatment today with plans to ice at home.  Pt. will continue to benefit from further skilled therapy to improve L LE strength, ROM, and functional capacity.     PT Treatment/Interventions Patient/family education;ADLs/Self Care Home Management;Therapeutic exercise;Scar mobilization;Gait training;Stair training;Neuromuscular re-education;Cryotherapy;Vasopneumatic Device;Taping;Passive range of motion;Balance training;Manual techniques;Functional mobility training;Moist Heat;Iontophoresis 4mg /ml  Dexamethasone   PT Next Visit Plan Continue knee range of motion exercises and hip & knee strengthening; gait training; manual therapy & modalities PRN      Patient will benefit from skilled therapeutic intervention in order to improve the following deficits and impairments:  Pain, Abnormal gait, Decreased activity tolerance, Decreased range of motion, Decreased strength, Difficulty walking, Increased edema, Impaired flexibility, Decreased mobility, Decreased scar mobility, Decreased balance  Visit Diagnosis: Acute pain of left knee  Stiffness of left knee, not elsewhere classified  Muscle weakness (generalized)  Difficulty in walking, not elsewhere classified     Problem List Patient Active Problem List   Diagnosis Date Noted  . Primary osteoarthritis of left knee 10/04/2015  . Osteoporosis 08/23/2015  . Multinodular goiter (nontoxic) 04/07/2014  . Diabetes mellitus type 2, uncontrolled, without complications ([place]) [id number]  . Insulin adverse reaction 09/27/2013  . Senile nuclear sclerosis 08/14/2012  . HTN, goal below 130/80 03/10/2011  . Hyperlipidemia with target LDL less than 70 03/10/2011  . Obesity (BMI 30.0-34.9) 03/10/2011  . Health care maintenance 03/10/2011    Bess Harvest, PTA 03/01/16 12:10 PM   [facility] [address]  [place], [place], [address] Phone: [phone]   Fax:  [phone]  Name: Zeniya Odegaard MRN: [id number] Date of Birth: Oct 24, 1940

## 2016-03-02 ENCOUNTER — Ambulatory Visit (INDEPENDENT_AMBULATORY_CARE_PROVIDER_SITE_OTHER): Payer: Medicare Other | Admitting: Family Medicine

## 2016-03-02 ENCOUNTER — Encounter: Payer: Self-pay | Admitting: Family Medicine

## 2016-03-02 VITALS — BP 142/75 | HR 72 | Temp 98.2°F | Wt 152.0 lb

## 2016-03-02 DIAGNOSIS — Z87440 Personal history of urinary (tract) infections: Secondary | ICD-10-CM | POA: Diagnosis not present

## 2016-03-02 DIAGNOSIS — D62 Acute posthemorrhagic anemia: Secondary | ICD-10-CM

## 2016-03-02 DIAGNOSIS — Z794 Long term (current) use of insulin: Secondary | ICD-10-CM | POA: Diagnosis not present

## 2016-03-02 DIAGNOSIS — E1165 Type 2 diabetes mellitus with hyperglycemia: Secondary | ICD-10-CM

## 2016-03-02 DIAGNOSIS — R350 Frequency of micturition: Secondary | ICD-10-CM | POA: Diagnosis not present

## 2016-03-02 DIAGNOSIS — E785 Hyperlipidemia, unspecified: Secondary | ICD-10-CM | POA: Diagnosis not present

## 2016-03-02 DIAGNOSIS — IMO0001 Reserved for inherently not codable concepts without codable children: Secondary | ICD-10-CM

## 2016-03-02 DIAGNOSIS — R030 Elevated blood-pressure reading, without diagnosis of hypertension: Secondary | ICD-10-CM

## 2016-03-02 MED ORDER — GLUCOSE BLOOD VI STRP: ORAL_STRIP | 12 refills | Status: DC

## 2016-03-02 NOTE — Patient Instructions (Signed)
It was a pleasure to see you today- I am glad that you are doing so well!  We will check a urine culture today to make sure that your infection is cleared up Please do send me a message at your convenience with the name of your blood pressure med that you use as needed  Please come in for fasting labs in about 2 months - you can come for a lab visit only.

## 2016-03-02 NOTE — Progress Notes (Signed)
Pre visit review using our clinic review tool, if applicable. No additional management support is needed unless otherwise documented below in the visit note. 

## 2016-03-02 NOTE — Progress Notes (Signed)
Wanda [name] at [facility] [address] [place], [place] [address] [phone]  Date:  03/02/2016   Name:  Wanda [name]   DOB:  [birth year]   MRN:  [id number]  PCP:  Lamar Blinks, MD    Chief Complaint: Follow-up   History of Present Illness:  Wanda [name] is a 76 y.o. very pleasant female patient who presents with the following: Had her right knee done in October and recovered well.  Then had left total knee arthroplasty on 01-28-2016.  Went to [facility] for rehab, from [phone] to 02-08-2016.  Has been receiving outpatient PT, last seen 03-01-2016.   She is doing very well in her opinion and is pleased with her results and progress.  Her pain and mobility continue to improve  She is still using a cane when she leaves the house, and is getting PT 3x a week She is still using pain meds as needed  Seen last here in October-  Lab Results  Component Value Date   HGBA1C 8.8 (H) 01/20/2016   She is down to 14- 15 units of lantus.  Her am glucose is running 130; she is more active again and has lost significant weight so this is also helping control her glucose. She has changed her diet  Wt Readings from Last 3 Encounters:  03/02/16 152 lb (68.9 kg)  01/20/16 158 lb 14.4 oz (72.1 kg)  10/18/15 166 lb 12.8 oz (75.7 kg)   BP Readings from Last 3 Encounters:  03/02/16 (!) 167/72  01/31/16 (!) 178/63  01/20/16 (!) 194/79   She notes that most of the time her BP is in the 140/60s, but will be higher when she is in pain.  [place] did give her a BP med that she uses prn if her SBP is higher than 170; however this is rare for her.  She is not sure the name of the medicatoin  She was dx with "a slight UTI" in the hospital prior to her operation- however she never had an sx of this except for frequency.  She was treated with some sort of abx but would like to make sure that all is well  Patient Active Problem List   Diagnosis Date  Noted  . Primary osteoarthritis of left knee 10/04/2015  . Osteoporosis 08/23/2015  . Multinodular goiter (nontoxic) 04/07/2014  . Diabetes mellitus type 2, uncontrolled, without complications ([place]) 29/47/6546  . Insulin adverse reaction 09/27/2013  . Senile nuclear sclerosis 08/14/2012  . HTN, goal below 130/80 03/10/2011  . Hyperlipidemia with target LDL less than 70 03/10/2011  . Obesity (BMI 30.0-34.9) 03/10/2011  . Health care maintenance 03/10/2011    Past Medical History:  Diagnosis Date  . Arthritis   . Cataract   . Diabetes mellitus   . Hypertension   . Skin cancer    Removed from face  . Urgency of urination   . Urinary leakage     Past Surgical History:  Procedure Laterality Date  . ABDOMINAL HYSTERECTOMY  early 80's   total  . CARPAL TUNNEL RELEASE Bilateral 1980 and 1981   both hands  . EYE SURGERY     bilateral cataracts  . Fatty Tumor Excision    . JOINT REPLACEMENT    . TONSILLECTOMY    . TONSILLECTOMY AND ADENOIDECTOMY  age 60  . TOTAL KNEE ARTHROPLASTY Right 10/04/2015  . TOTAL KNEE ARTHROPLASTY Right 10/04/2015   Procedure: TOTAL KNEE ARTHROPLASTY;  Surgeon: Jenny Reichmann  Berenice Primas, MD;  Location: [facility];  Service: Orthopedics;  Laterality: Right;  . TOTAL KNEE ARTHROPLASTY Left 01/28/2016   Procedure: TOTAL KNEE ARTHROPLASTY;  Surgeon: Dorna Leitz, MD;  Location: [facility];  Service: Orthopedics;  Laterality: Left;  . TUBAL LIGATION      Social History  Substance Use Topics  . Smoking status: Never Smoker  . Smokeless tobacco: Never Used  . Alcohol use 0.5 oz/week    1 drink(s) per week    Family History  Problem Relation Age of Onset  . Pancreatitis Father     deceased 53  . Cancer    . Cancer Brother     GI  . Cancer Mother     liver  . Cancer Son     terminal kidney    Allergies  Allergen Reactions  . Ace Inhibitors Swelling and Cough    Pt had cough and diarrhea with first few doses of medication; also had swelling of right eyelid. Stopped  medication on 03/17/11.  . Alendronate Sodium Other (See Comments)    Whole body hurt  . Aspirin Hives  . Glucotrol [Glipizide] Other (See Comments)    Migraine "color flashes" in L eye with dizziness and lightheadedness.  . Lipitor [Atorvastatin Calcium] Other (See Comments)    Whole body hurt  . Metformin And Related     Severe abdominal pain  . Shellfish Allergy     Intolerant of fresh shellfish, reports the reaction is GI upset, denies hives, denies any swelling  Reports that she can tolerate canned seafood.     Medication list has been reviewed and updated.  Current Outpatient Prescriptions on File Prior to Visit  Medication Sig Dispense Refill  . acetaminophen (TYLENOL) 325 MG tablet Take 650 mg by mouth every 6 (six) hours as needed for mild pain.    . BD PEN NEEDLE NANO U/F 32G X 4 MM MISC USE TO INJECT INSULIN ONCE DAILY 100 each 0  . Blood Glucose Monitoring Suppl (ONE TOUCH ULTRA MINI) w/Device KIT Use to test blood sugar daily as instructed. Dx: E11.65 1 each 0  . Insulin Glargine (LANTUS SOLOSTAR) 100 UNIT/ML Solostar Pen Inject 15 Units into the skin daily at 10 pm.    . niacin (NIASPAN) 500 MG CR tablet Take 1 tablet (500 mg total) by mouth at bedtime. Increase to 2 tablets after 1 month (Patient taking differently: Take 500 mg by mouth daily. ) 180 tablet 2  . oxyCODONE-acetaminophen (PERCOCET/ROXICET) 5-325 MG tablet Take 1-2 tablets by mouth every 6 (six) hours as needed for severe pain. 60 tablet 0  . tiZANidine (ZANAFLEX) 2 MG tablet Take 1 tablet (2 mg total) by mouth every 8 (eight) hours as needed for muscle spasms. 50 tablet 0  . vitamin E 200 UNIT capsule Take 200 Units by mouth daily. Reported on 05/31/2015    . Insulin Syringe-Needle U-100 (INSULIN SYRINGE .5CC/31GX5/16") 31G X 5/16" 0.5 ML MISC Use to inject insulin 1 time daily. (Patient not taking: Reported on 03/02/2016) 90 each 3  . [DISCONTINUED] lisinopril (PRINIVIL,ZESTRIL) 20 MG tablet Take 20 mg by mouth  daily.      No current facility-administered medications on file prior to visit.     Review of Systems:  As per HPI- otherwise negative.   Physical Examination: Vitals:   03/02/16 1127  BP: (!) 167/72  Pulse: 72  Temp: 98.2 F (36.8 C)   Vitals:   03/02/16 1127  Weight: 152 lb (68.9 kg)   Body  mass index is 28.72 kg/m. Ideal Body Weight:    GEN: WDWN, NAD, Non-toxic, A & O x 3, looks well, has lost weight HEENT: Atraumatic, Normocephalic. Neck supple. No masses, No LAD. Ears and Nose: No external deformity. CV: RRR, No M/G/R. No JVD. No thrill. No extra heart sounds. PULM: CTA B, no wheezes, crackles, rhonchi. No retractions. No resp. distress. No accessory muscle use.Marland Kitchen EXTR: No c/c/e.  Well healing scar over her left anterior knee  NEURO mildly antalgic, using a cane PSYCH: Normally interactive. Conversant. Not depressed or anxious appearing.  Calm demeanor.    Assessment and Plan: Uncontrolled type 2 diabetes mellitus without complication, with long-term current use of insulin ([place]) - Plan: Hemoglobin A1c  Acute blood loss anemia - Plan: CBC  Dyslipidemia - Plan: Lipid panel  History of UTI - Plan: Urine culture  Urinary frequency - Plan: Urine culture  Elevated BP without diagnosis of hypertension   Here today to follow-up after a knee operation She has done very well and we are pleased!  It is too early to repeat her A1c; she will come in for this and other fasting labs in about 2 months We will repeat her urine culture today to make sure her infection is cleared up She will let me know if any concerns Will plan further follow- up pending labs. She endorses ok BP control at home but will let me know what med she has for prn use. I may advise her to use it daily    Signed Lamar Blinks, MD

## 2016-03-03 ENCOUNTER — Ambulatory Visit: Payer: Medicare Other | Admitting: Physical Therapy

## 2016-03-03 DIAGNOSIS — M6281 Muscle weakness (generalized): Secondary | ICD-10-CM

## 2016-03-03 DIAGNOSIS — M25662 Stiffness of left knee, not elsewhere classified: Secondary | ICD-10-CM

## 2016-03-03 DIAGNOSIS — R262 Difficulty in walking, not elsewhere classified: Secondary | ICD-10-CM

## 2016-03-03 DIAGNOSIS — M25562 Pain in left knee: Secondary | ICD-10-CM

## 2016-03-03 LAB — URINE CULTURE: Organism ID, Bacteria: NO GROWTH

## 2016-03-03 NOTE — Therapy (Signed)
[facility] [address]  [place], [place], [address] Phone: [phone]   Fax:  [phone]  Physical Therapy Treatment  Patient Details  Name: Wanda [name] MRN: [id number] Date of Birth: November 01, 1940 Referring Provider: Dr. Alta Corning  Encounter Date: 03/03/2016      PT End of Session - 03/03/16 1054    Visit Number 8   Number of Visits 18   Date for PT Re-Evaluation 03/24/16   Authorization Type Medicare   PT Start Time 1054   PT Stop Time 1145   PT Time Calculation (min) 51 min   Activity Tolerance Patient tolerated treatment well;No increased pain   Behavior During Therapy WFL for tasks assessed/performed      Past Medical History:  Diagnosis Date  . Arthritis   . Cataract   . Diabetes mellitus   . Hypertension   . Skin cancer    Removed from face  . Urgency of urination   . Urinary leakage     Past Surgical History:  Procedure Laterality Date  . ABDOMINAL HYSTERECTOMY  early 80's   total  . CARPAL TUNNEL RELEASE Bilateral 1980 and 1981   both hands  . EYE SURGERY     bilateral cataracts  . Fatty Tumor Excision    . JOINT REPLACEMENT    . TONSILLECTOMY    . TONSILLECTOMY AND ADENOIDECTOMY  age 56  . TOTAL KNEE ARTHROPLASTY Right 10/04/2015  . TOTAL KNEE ARTHROPLASTY Right 10/04/2015   Procedure: TOTAL KNEE ARTHROPLASTY;  Surgeon: Dorna Leitz, MD;  Location: [facility];  Service: Orthopedics;  Laterality: Right;  . TOTAL KNEE ARTHROPLASTY Left 01/28/2016   Procedure: TOTAL KNEE ARTHROPLASTY;  Surgeon: Dorna Leitz, MD;  Location: [facility];  Service: Orthopedics;  Laterality: Left;  . TUBAL LIGATION      There were no vitals filed for this visit.      Subjective Assessment - 03/03/16 1057    Subjective Pt states knee is just "aggravating" at this point. Pt wants the L knee to catch up with the R.   Patient Stated Goals "to get as much range of motion as possible"   Currently in Pain? Yes    Pain Score 3    Pain Location Knee   Pain Orientation Left   Pain Descriptors / Indicators Tightness   Pain Type Surgical pain   Pain Onset More than a month ago   Pain Frequency Constant   Aggravating Factors  "time"   Pain Relieving Factors pain meds & muscle relaxants   Effect of Pain on Daily Activities doesn't go out as much            [facility] PT Assessment - 03/03/16 1054      AROM   Left Knee Extension 7  with LAQ; 4 dg - supported in supine     PROM   Left Knee Extension 4                     [facility] Adult PT Treatment/Exercise - 03/03/16 1054      Knee/Hip Exercises: Stretches   Knee: Self-Stretch to increase Flexion 10 seconds;5 reps   Knee: Self-Stretch Limitations Step stretch   Gastroc Stretch Left;30 seconds;2 reps   Gastroc Stretch Limitations Prostretch     Knee/Hip Exercises: Aerobic   Nustep lvl 5 x 6'     Knee/Hip Exercises: Machines for Strengthening   Cybex Knee Extension 20# B con/ecc x15  Cybex Knee Flexion 25# B con/ecc x15; 20# B con/L ecc x10     Knee/Hip Exercises: Standing   Heel Raises Both;20 reps;3 seconds   Heel Raises Limitations negative heel - UE support on back of UBE   Hip Flexion Both;15 reps;Knee straight   Hip Flexion Limitations yellow TB, UE support on counter   Terminal Knee Extension Left;15 reps;Theraband   Theraband Level (Terminal Knee Extension) Level 4 (Blue)   Hip Abduction Both;15 reps;Knee straight   Abduction Limitations yellow TB, UE support on counter   Hip Extension Both;15 reps;Knee straight   Extension Limitations yellow TB, UE support on counter   Lateral Step Up Left;10 reps;Hand Hold: 2;Step Height: 6"   Lateral Step Up Limitations UE support on counter   Forward Step Up Left;10 reps;Hand Hold: 1;Step Height: 6"   Forward Step Up Limitations UE support on counter   Wall Squat 10 reps;3 seconds   Wall Squat Limitations cues for even weight bearing   Other Standing Knee Exercises B  Side-stepping along counter with yellow TB x2     Manual Therapy   Manual Therapy Joint mobilization;Myofascial release   Joint Mobilization L knee A/P mobs for flexion/extension; patellar mobs all directions with emphasis on superior/inferior for increased ROM   Myofascial Release MFR/TPR to L ITB                PT Education - 03/03/16 1119    Education provided Yes   Education Details Yellow TB added to 3 way SLR for home + sidestepping with yellow TB   Person(s) Educated Patient   Methods Explanation;Demonstration   Comprehension Verbalized understanding;Returned demonstration          PT Short Term Goals - 02/21/16 1054      PT SHORT TERM GOAL #1   Title pt will be independent with initial HEP by 02/25/16   Status Achieved           PT Long Term Goals - 02/18/16 1109      PT LONG TERM GOAL #1   Title Pt will be indepedent with advanced HEP by 03/24/16   Status On-going     PT LONG TERM GOAL #2   Title pt will have L knee AROM 3-115 dg to allow for normal gait mechanics by 03/24/16.   Status On-going     PT LONG TERM GOAL #3   Title pt will have L hip and knee strength >/= 4/5 for improved function by 03/24/16.   Status On-going     PT LONG TERM GOAL #4   Title Pt will be able to ambulate with normal gait pattern without AD by 03/24/16.   Status On-going               Plan - 03/03/16 1100    Clinical Impression Statement Pt frustrated that L knee seems slower to progress, expecting it to be even with R knee. Reminded pt that L knee is 4 months behind the R knee and actually progressing more quickly than the R did initially. Continued progression of weight bearing/CKC strengthening emphasizing quad control and full extension ROM with good pt tolerance. Yellow TB added to standing SLR for HEP. Pt continues to prefer to ice at home, but reinforced positioning to promote full knee extension.   Rehab Potential Good   Clinical Impairments Affecting Rehab  Potential R TKA 10/04/15   PT Treatment/Interventions Patient/family education;ADLs/Self Care Home Management;Therapeutic exercise;Scar mobilization;Gait training;Stair training;Neuromuscular re-education;Cryotherapy;Vasopneumatic Device;Taping;Passive range of motion;Balance  training;Manual techniques;Functional mobility training;Moist Heat;Iontophoresis 4mg /ml Dexamethasone   PT Next Visit Plan Continue knee range of motion exercises and hip & knee strengthening; gait training; manual therapy & modalities PRN   Consulted and Agree with Plan of Care Patient      Patient will benefit from skilled therapeutic intervention in order to improve the following deficits and impairments:  Pain, Abnormal gait, Decreased activity tolerance, Decreased range of motion, Decreased strength, Difficulty walking, Increased edema, Impaired flexibility, Decreased mobility, Decreased scar mobility, Decreased balance  Visit Diagnosis: Acute pain of left knee  Stiffness of left knee, not elsewhere classified  Muscle weakness (generalized)  Difficulty in walking, not elsewhere classified     Problem List Patient Active Problem List   Diagnosis Date Noted  . Primary osteoarthritis of left knee 10/04/2015  . Osteoporosis 08/23/2015  . Multinodular goiter (nontoxic) 04/07/2014  . Diabetes mellitus type 2, uncontrolled, without complications ([place]) [id number]  . Insulin adverse reaction 09/27/2013  . Senile nuclear sclerosis 08/14/2012  . HTN, goal below 130/80 03/10/2011  . Hyperlipidemia with target LDL less than 70 03/10/2011  . Obesity (BMI 30.0-34.9) 03/10/2011  . Health care maintenance 03/10/2011    Percival Spanish, PT, MPT 03/03/2016, 12:08 PM  [facility] [address]  [place], [place], [address] Phone: [phone]   Fax:  [phone]  Name: Wanda [name] MRN: [id number] Date of Birth: October 20, 1940

## 2016-03-06 ENCOUNTER — Ambulatory Visit: Payer: Medicare Other

## 2016-03-06 DIAGNOSIS — M6281 Muscle weakness (generalized): Secondary | ICD-10-CM | POA: Diagnosis not present

## 2016-03-06 DIAGNOSIS — R262 Difficulty in walking, not elsewhere classified: Secondary | ICD-10-CM

## 2016-03-06 DIAGNOSIS — M25562 Pain in left knee: Secondary | ICD-10-CM

## 2016-03-06 DIAGNOSIS — M25662 Stiffness of left knee, not elsewhere classified: Secondary | ICD-10-CM

## 2016-03-06 NOTE — Therapy (Signed)
[facility] [address]  [place], [place], [address] Phone: [phone]   Fax:  [phone]  Physical Therapy Treatment  Patient Details  Name: Wanda [name] MRN: [id number] Date of Birth: 1940-03-13 Referring Provider: Dr. Alta Corning  Encounter Date: 03/06/2016      PT End of Session - 03/06/16 1108    Visit Number 9   Number of Visits 18   Date for PT Re-Evaluation 03/24/16   Authorization Type Medicare   PT Start Time 1102   PT Stop Time 1146   PT Time Calculation (min) 44 min   Activity Tolerance Patient tolerated treatment well;No increased pain   Behavior During Therapy WFL for tasks assessed/performed      Past Medical History:  Diagnosis Date  . Arthritis   . Cataract   . Diabetes mellitus   . Hypertension   . Skin cancer    Removed from face  . Urgency of urination   . Urinary leakage     Past Surgical History:  Procedure Laterality Date  . ABDOMINAL HYSTERECTOMY  early 80's   total  . CARPAL TUNNEL RELEASE Bilateral 1980 and 1981   both hands  . EYE SURGERY     bilateral cataracts  . Fatty Tumor Excision    . JOINT REPLACEMENT    . TONSILLECTOMY    . TONSILLECTOMY AND ADENOIDECTOMY  age 12  . TOTAL KNEE ARTHROPLASTY Right 10/04/2015  . TOTAL KNEE ARTHROPLASTY Right 10/04/2015   Procedure: TOTAL KNEE ARTHROPLASTY;  Surgeon: Dorna Leitz, MD;  Location: [facility];  Service: Orthopedics;  Laterality: Right;  . TOTAL KNEE ARTHROPLASTY Left 01/28/2016   Procedure: TOTAL KNEE ARTHROPLASTY;  Surgeon: Dorna Leitz, MD;  Location: [facility];  Service: Orthopedics;  Laterality: Left;  . TUBAL LIGATION      There were no vitals filed for this visit.      Subjective Assessment - 03/06/16 1107    Subjective Pt. reporting pain better controlled today.     Patient Stated Goals "to get as much range of motion as possible"   Currently in Pain? Yes   Pain Score 3    Pain Location Knee   Pain  Orientation Left   Pain Descriptors / Indicators Aching  deep ache    Pain Type Surgical pain   Pain Radiating Towards n/a   Pain Onset More than a month ago   Pain Frequency Constant   Multiple Pain Sites No             [facility] Adult PT Treatment/Exercise - 03/06/16 1115      Transfers   Transfers Sit to Stand  focusing on 3" eccentric lowering    Sit to Stand --  from mat table x 10 reps; constant tactile for L wt. shift      Knee/Hip Exercises: Stretches   Knee: Self-Stretch to increase Flexion 10 seconds;5 reps   Knee: Self-Stretch Limitations Step stretch on treadmill   pt. confirming "good stretch"   Gastroc Stretch Left;30 seconds;2 reps   Gastroc Stretch Limitations Prostretch   Other Knee/Hip Stretches Prone L quad stretch with strap x 1 min      Knee/Hip Exercises: Aerobic   Stationary Bike Stationary bike: level 1, 6 min full revolutions      Knee/Hip Exercises: Standing   Hip Flexion Both;Knee straight;20 reps   Hip Flexion Limitations yellow TB, UE support on counter   Hip Abduction Both;Knee straight;20 reps   Abduction  Limitations yellow TB, UE support on counter   Hip Extension Both;Knee straight;20 reps   Extension Limitations yellow TB, UE support on counter   Lateral Step Up Left;Step Height: 6";Hand Hold: 1;15 reps   Lateral Step Up Limitations 1 UE support on ski pole    Forward Step Up Left;Hand Hold: 1;Step Height: 6";15 reps   Forward Step Up Limitations 1 UE support on ski pole    Wall Squat 3 seconds;15 reps   Wall Squat Limitations performed with wt. shift over L side   Other Standing Knee Exercises B Side-stepping along counter with yellow TB x 3 reps     Manual Therapy   Manual Therapy Joint mobilization;Muscle Energy Technique;Passive ROM   Joint Mobilization L knee A/P mobs for flexion/extension; patellar mobs superior/inferior for increased ROM   Passive ROM Gentle flexion/extension stretch with therapist    Muscle Energy Technique  Contract/Relax technique with therapist                   PT Short Term Goals - 02/21/16 1054      PT SHORT TERM GOAL #1   Title pt will be independent with initial HEP by 02/25/16   Status Achieved           PT Long Term Goals - 02/18/16 1109      PT LONG TERM GOAL #1   Title Pt will be indepedent with advanced HEP by 03/24/16   Status On-going     PT LONG TERM GOAL #2   Title pt will have L knee AROM 3-115 dg to allow for normal gait mechanics by 03/24/16.   Status On-going     PT LONG TERM GOAL #3   Title pt will have L hip and knee strength >/= 4/5 for improved function by 03/24/16.   Status On-going     PT LONG TERM GOAL #4   Title Pt will be able to ambulate with normal gait pattern without AD by 03/24/16.   Status On-going               Plan - 03/06/16 1109    Clinical Impression Statement Pt. noting improvement in pain levels initially today.  Pt. tolerating increased repetitions and advancement in squatting activity today well with L knee pain decreased following therex.  ROM not formally measured today however seems to be improving with lunge stretch.  Pt. continues to prefer icing at home.  Pt. will continue to benefit from further skilled therapy to improved ROM, strength, and overall functional capacity.     PT Treatment/Interventions Patient/family education;ADLs/Self Care Home Management;Therapeutic exercise;Scar mobilization;Gait training;Stair training;Neuromuscular re-education;Cryotherapy;Vasopneumatic Device;Taping;Passive range of motion;Balance training;Manual techniques;Functional mobility training;Moist Heat;Iontophoresis 4mg /ml Dexamethasone   PT Next Visit Plan Continue knee range of motion exercises and hip & knee strengthening; gait training; manual therapy & modalities PRN      Patient will benefit from skilled therapeutic intervention in order to improve the following deficits and impairments:  Pain, Abnormal gait, Decreased activity  tolerance, Decreased range of motion, Decreased strength, Difficulty walking, Increased edema, Impaired flexibility, Decreased mobility, Decreased scar mobility, Decreased balance  Visit Diagnosis: Acute pain of left knee  Stiffness of left knee, not elsewhere classified  Muscle weakness (generalized)  Difficulty in walking, not elsewhere classified     Problem List Patient Active Problem List   Diagnosis Date Noted  . Primary osteoarthritis of left knee 10/04/2015  . Osteoporosis 08/23/2015  . Multinodular goiter (nontoxic) 04/07/2014  . Diabetes mellitus type  2, uncontrolled, without complications ([place]) [id number]  . Insulin adverse reaction 09/27/2013  . Senile nuclear sclerosis 08/14/2012  . HTN, goal below 130/80 03/10/2011  . Hyperlipidemia with target LDL less than 70 03/10/2011  . Obesity (BMI 30.0-34.9) 03/10/2011  . Health care maintenance 03/10/2011    .Bess Harvest, PTA 03/06/16 12:02 PM  [facility] [address]  [place], [place], [address] Phone: [phone]   Fax:  [phone]  Name: Wanda [name] MRN: [id number] Date of Birth: [birth year]

## 2016-03-07 ENCOUNTER — Ambulatory Visit: Payer: Medicare Other | Admitting: Physical Therapy

## 2016-03-08 ENCOUNTER — Ambulatory Visit: Payer: Medicare Other

## 2016-03-08 DIAGNOSIS — M25662 Stiffness of left knee, not elsewhere classified: Secondary | ICD-10-CM | POA: Diagnosis not present

## 2016-03-08 DIAGNOSIS — M25562 Pain in left knee: Secondary | ICD-10-CM | POA: Diagnosis not present

## 2016-03-08 DIAGNOSIS — R262 Difficulty in walking, not elsewhere classified: Secondary | ICD-10-CM

## 2016-03-08 DIAGNOSIS — M6281 Muscle weakness (generalized): Secondary | ICD-10-CM | POA: Diagnosis not present

## 2016-03-08 NOTE — Therapy (Signed)
[facility] [address]  [place], [place], [address] Phone: [phone]   Fax:  [phone]  Physical Therapy Treatment  Patient Details  Name: Wanda [name] MRN: [id number] Date of Birth: 1940-11-15 Referring Provider: Dr. Alta Corning  Encounter Date: 03/08/2016      PT End of Session - 03/08/16 1106    Visit Number 10   Number of Visits 18   Date for PT Re-Evaluation 03/24/16   Authorization Type Medicare   PT Start Time 1101   PT Stop Time 2751  decreased treatment time billed due to goal testing    PT Time Calculation (min) 57 min   Activity Tolerance Patient tolerated treatment well;No increased pain   Behavior During Therapy WFL for tasks assessed/performed      Past Medical History:  Diagnosis Date  . Arthritis   . Cataract   . Diabetes mellitus   . Hypertension   . Skin cancer    Removed from face  . Urgency of urination   . Urinary leakage     Past Surgical History:  Procedure Laterality Date  . ABDOMINAL HYSTERECTOMY  early 80's   total  . CARPAL TUNNEL RELEASE Bilateral 1980 and 1981   both hands  . EYE SURGERY     bilateral cataracts  . Fatty Tumor Excision    . JOINT REPLACEMENT    . TONSILLECTOMY    . TONSILLECTOMY AND ADENOIDECTOMY  age 49  . TOTAL KNEE ARTHROPLASTY Right 10/04/2015  . TOTAL KNEE ARTHROPLASTY Right 10/04/2015   Procedure: TOTAL KNEE ARTHROPLASTY;  Surgeon: Dorna Leitz, MD;  Location: [facility];  Service: Orthopedics;  Laterality: Right;  . TOTAL KNEE ARTHROPLASTY Left 01/28/2016   Procedure: TOTAL KNEE ARTHROPLASTY;  Surgeon: Dorna Leitz, MD;  Location: [facility];  Service: Orthopedics;  Laterality: Left;  . TUBAL LIGATION      There were no vitals filed for this visit.      Subjective Assessment - 03/08/16 1105    Subjective Pt. noting normal soreness in knee today and no muscular soreness following last treatment.     Patient Stated Goals "to get as much range  of motion as possible"   Currently in Pain? Yes   Pain Score 3    Pain Location Knee   Pain Orientation Left   Pain Descriptors / Indicators Aching   Pain Type Surgical pain   Pain Onset More than a month ago   Multiple Pain Sites No            [facility] PT Assessment - 03/08/16 1135      Observation/Other Assessments   Focus on Therapeutic Outcomes (FOTO)  60% (40% limitation)          [facility] Adult PT Treatment/Exercise - 03/08/16 1135      Transfers   Transfers Sit to Stand  2 x 15 reps from mat table on airex; 2nd set w foam under R      Knee/Hip Exercises: Stretches   Gastroc Stretch Left;30 seconds;2 reps   Company secretary   Other Knee/Hip Stretches Prone L quad stretch with strap x 1 min    Other Knee/Hip Stretches L ITB stretch with strap x 30 sec      Knee/Hip Exercises: Aerobic   Stationary Bike Stationary bike: level 1, 6 min full revolutions      Knee/Hip Exercises: Machines for Strengthening   Cybex Knee Extension 20# B con/L ecc x 12 reps  Cybex Knee Flexion 20# B con/L ecc x 15 reps     Knee/Hip Exercises: Standing   Heel Raises Both;20 reps;3 seconds   Heel Raises Limitations at UBE: B concentric/wt. shift to L eccentric   Lateral Step Up Left;Step Height: 6";Hand Hold: 1;20 reps   Lateral Step Up Limitations 1 UE support on ski pole    Forward Step Up Left;Hand Hold: 1;Step Height: 6";20 reps   Forward Step Up Limitations 1 UE support on ski pole      Manual Therapy   Manual Therapy Joint mobilization;Passive ROM   Joint Mobilization L knee A/P mobs for flexion/extension; patellar mobs superior/inferior for increased ROM   Passive ROM Gentle flexion/extension stretch with therapist             PT Short Term Goals - 02/21/16 1054      PT SHORT TERM GOAL #1   Title pt will be independent with initial HEP by 02/25/16   Status Achieved           PT Long Term Goals - 03/08/16 1123      PT LONG TERM GOAL #1    Title Pt will be indepedent with advanced HEP by 03/24/16   Status Partially Met  2.28.18: met for current      PT LONG TERM GOAL #2   Title pt will have L knee AROM 3-115 dg to allow for normal gait mechanics by 03/24/16.   Status On-going  2.28.18:  7-101 dg AROM (4-109 dg PROM)     PT LONG TERM GOAL #3   Title pt will have L hip and knee strength >/= 4/5 for improved function by 03/24/16.   Status Partially Met  2.28.18: met except 4-/5 L hip extension     PT LONG TERM GOAL #4   Title Pt will be able to ambulate with normal gait pattern without AD by 03/24/16.   Status Partially Met  2.28.18: Pt. still with slight forward trunk lean and shortened step length on R without AD, pain free however                 Plan - 03/08/16 1107    Clinical Impression Statement Pt. progressing well at this point.  Pt. able to demo improved L knee ROM today: AROM 9-101 dg (7 dg supine with SLR), PROM 4-109 dg.  Pt. partially meeting strength goal today with only L hip extension still 4-/5 with testing today.  Pt. able to ambulate with good overall mechanics without SPC today however still requiring some cueing to prevent forward trunk lean and for increased step length with R.  Pt. will continue to benefit from further skilled therapy to improve LE strength, ROM, and participation in functional activities.     PT Treatment/Interventions Patient/family education;ADLs/Self Care Home Management;Therapeutic exercise;Scar mobilization;Gait training;Stair training;Neuromuscular re-education;Cryotherapy;Vasopneumatic Device;Taping;Passive range of motion;Balance training;Manual techniques;Functional mobility training;Moist Heat;Iontophoresis 68m/ml Dexamethasone   PT Next Visit Plan Continue knee range of motion exercises and hip & knee strengthening; gait training; manual therapy & modalities PRN      Patient will benefit from skilled therapeutic intervention in order to improve the following deficits and  impairments:  Pain, Abnormal gait, Decreased activity tolerance, Decreased range of motion, Decreased strength, Difficulty walking, Increased edema, Impaired flexibility, Decreased mobility, Decreased scar mobility, Decreased balance  Visit Diagnosis: Acute pain of left knee  Stiffness of left knee, not elsewhere classified  Muscle weakness (generalized)  Difficulty in walking, not elsewhere classified  G-Codes - 03/08/16 1212    Functional Assessment Tool Used (Outpatient Only) Knee FOTO = 60% (40% limitation)    Functional Limitation Mobility: Walking and moving around   Mobility: Walking and Moving Around Current Status [phone]) At least 40 percent but less than 60 percent impaired, limited or restricted   Mobility: Walking and Moving Around Goal Status [phone]) At least 20 percent but less than 40 percent impaired, limited or restricted      Problem List Patient Active Problem List   Diagnosis Date Noted  . Primary osteoarthritis of left knee 10/04/2015  . Osteoporosis 08/23/2015  . Multinodular goiter (nontoxic) 04/07/2014  . Diabetes mellitus type 2, uncontrolled, without complications ([place]) [id number]/[id number]  . Insulin adverse reaction 09/27/2013  . Senile nuclear sclerosis 08/14/2012  . HTN, goal below 130/80 03/10/2011  . Hyperlipidemia with target LDL less than 70 03/10/2011  . Obesity (BMI 30.0-34.9) 03/10/2011  . Health care maintenance 03/10/2011    Bess Harvest, PTA 03/08/16 12:56 PM  [facility] [address]  [place], [place], [address] Phone: [phone]   Fax:  [phone]  Name: Wanda [name] MRN: [id number] Date of Birth: 30-Oct-1940   Percival Spanish, PT, MPT 03/08/16, 12:56 PM  [facility] [address]  [place], [place], [address] Phone: [phone]   Fax:  [phone]

## 2016-03-09 DIAGNOSIS — Z96652 Presence of left artificial knee joint: Secondary | ICD-10-CM | POA: Diagnosis not present

## 2016-03-09 DIAGNOSIS — M1712 Unilateral primary osteoarthritis, left knee: Secondary | ICD-10-CM | POA: Diagnosis not present

## 2016-03-10 ENCOUNTER — Ambulatory Visit: Payer: Medicare Other | Attending: Orthopedic Surgery | Admitting: Physical Therapy

## 2016-03-10 DIAGNOSIS — M25562 Pain in left knee: Secondary | ICD-10-CM | POA: Diagnosis not present

## 2016-03-10 DIAGNOSIS — M25662 Stiffness of left knee, not elsewhere classified: Secondary | ICD-10-CM | POA: Diagnosis not present

## 2016-03-10 DIAGNOSIS — M6281 Muscle weakness (generalized): Secondary | ICD-10-CM

## 2016-03-10 DIAGNOSIS — R262 Difficulty in walking, not elsewhere classified: Secondary | ICD-10-CM | POA: Diagnosis not present

## 2016-03-10 NOTE — Therapy (Signed)
[facility] [address]  [place], [place], [address] Phone: [phone]   Fax:  [phone]  Physical Therapy Treatment  Patient Details  Name: Wanda [name] MRN: [id number] Date of Birth: Jun 09, 1940 Referring Provider: Dr. Alta Corning  Encounter Date: 03/10/2016      PT End of Session - 03/10/16 1112    Visit Number 11   Number of Visits 18   Date for PT Re-Evaluation 03/24/16   Authorization Type Medicare   PT Start Time 1058   PT Stop Time 1142   PT Time Calculation (min) 44 min   Activity Tolerance Patient tolerated treatment well   Behavior During Therapy [facility] for tasks assessed/performed      Past Medical History:  Diagnosis Date  . Arthritis   . Cataract   . Diabetes mellitus   . Hypertension   . Skin cancer    Removed from face  . Urgency of urination   . Urinary leakage     Past Surgical History:  Procedure Laterality Date  . ABDOMINAL HYSTERECTOMY  early 80's   total  . CARPAL TUNNEL RELEASE Bilateral 1980 and 1981   both hands  . EYE SURGERY     bilateral cataracts  . Fatty Tumor Excision    . JOINT REPLACEMENT    . TONSILLECTOMY    . TONSILLECTOMY AND ADENOIDECTOMY  age 9  . TOTAL KNEE ARTHROPLASTY Right 10/04/2015  . TOTAL KNEE ARTHROPLASTY Right 10/04/2015   Procedure: TOTAL KNEE ARTHROPLASTY;  Surgeon: Dorna Leitz, MD;  Location: [place];  Service: Orthopedics;  Laterality: Right;  . TOTAL KNEE ARTHROPLASTY Left 01/28/2016   Procedure: TOTAL KNEE ARTHROPLASTY;  Surgeon: Dorna Leitz, MD;  Location: [place];  Service: Orthopedics;  Laterality: Left;  . TUBAL LIGATION      There were no vitals filed for this visit.      Subjective Assessment - 03/10/16 1058    Subjective Pt reports MD was pleased with progress at visit yesterday.    Patient Stated Goals "to get as much range of motion as possible"   Currently in Pain? Yes   Pain Score 3    Pain Location Knee   Pain Orientation  Left   Pain Onset More than a month ago                         [facility] Adult PT Treatment/Exercise - 03/10/16 1058      Knee/Hip Exercises: Stretches   Gastroc Stretch Left;30 seconds;2 reps   [employer] secretary     Knee/Hip Exercises: Aerobic   Stationary Bike lvl 1 x 6'     Knee/Hip Exercises: Machines for Strengthening   Cybex Knee Extension 20# B con/L ecc x 12 reps    Cybex Knee Flexion 25# B con/ecc x10; 25# B con/L ecc x15     Knee/Hip Exercises: Standing   Hip Flexion Left;10 reps;Knee straight   Hip Flexion Limitations red TB, single UE on back of chair   Forward Lunges Both;10 reps;3 seconds   Terminal Knee Extension Left;15 reps;Theraband   Theraband Level (Terminal Knee Extension) --  black   Hip ADduction Left;10 reps   Hip ADduction Limitations red TB, single UE on back of chair   Hip Abduction Left;10 reps;Knee straight   Abduction Limitations red TB, single UE on back of chair   Hip Extension Left;10 reps;Knee straight   Extension Limitations red TB, single  UE on back of chair   Lateral Step Up Left;15 reps;Hand Hold: 1;Step Height: 8"   Lateral Step Up Limitations 1 pole A   Forward Step Up Left;15 reps;Hand Hold: 1;Step Height: 8"   Forward Step Up Limitations 1-2 pole A   Functional Squat 10 reps;2 sets   Functional Squat Limitations TRX, 2nd set + toes raises     Knee/Hip Exercises: Supine   Quad Sets 5 reps   Quad Sets Limitations 10" hold with PT applying longitudinal distraction     Manual Therapy   Manual Therapy Joint mobilization   Joint Mobilization L knee A/P & P/A mobs in sitting and supine for flexion/extension; patellar mobs all directions with emphasis on superior/inferior for increased ROM                PT Education - 03/10/16 1151    Education provided Yes   Education Details Positional stretching for extension ROM - visually and verbally instructed in prone hangs   Person(s) Educated  Patient   Methods Explanation;Demonstration   Comprehension Verbalized understanding          PT Short Term Goals - 02/21/16 1054      PT SHORT TERM GOAL #1   Title pt will be independent with initial HEP by 02/25/16   Status Achieved           PT Long Term Goals - 03/08/16 1123      PT LONG TERM GOAL #1   Title Pt will be indepedent with advanced HEP by 03/24/16   Status Partially Met  2.28.18: met for current      PT LONG TERM GOAL #2   Title pt will have L knee AROM 3-115 dg to allow for normal gait mechanics by 03/24/16.   Status On-going  2.28.18:  7-101 dg AROM (4-109 dg PROM)     PT LONG TERM GOAL #3   Title pt will have L hip and knee strength >/= 4/5 for improved function by 03/24/16.   Status Partially Met  2.28.18: met except 4-/5 L hip extension     PT LONG TERM GOAL #4   Title Pt will be able to ambulate with normal gait pattern without AD by 03/24/16.   Status Partially Met  2.28.18: Pt. still with slight forward trunk lean and shortened step length on R without AD, pain free however                 Plan - 03/10/16 1113    Clinical Impression Statement Pt reports MD pleased with progress with L TKR. Pt able to progress resistance and tolerated advancement of several exercises today. Reviewed positional stretches for increased extension ROM including instruction in prone hangs over edge of bed at home.   PT Treatment/Interventions Patient/family education;ADLs/Self Care Home Management;Therapeutic exercise;Scar mobilization;Gait training;Stair training;Neuromuscular re-education;Cryotherapy;Vasopneumatic Device;Taping;Passive range of motion;Balance training;Manual techniques;Functional mobility training;Moist Heat;Iontophoresis 11m/ml Dexamethasone   PT Next Visit Plan Continue knee range of motion exercises and hip & knee strengthening; gait training; manual therapy & modalities PRN      Patient will benefit from skilled therapeutic intervention in  order to improve the following deficits and impairments:  Pain, Abnormal gait, Decreased activity tolerance, Decreased range of motion, Decreased strength, Difficulty walking, Increased edema, Impaired flexibility, Decreased mobility, Decreased scar mobility, Decreased balance  Visit Diagnosis: Acute pain of left knee  Stiffness of left knee, not elsewhere classified  Muscle weakness (generalized)  Difficulty in walking, not elsewhere classified  Problem List Patient Active Problem List   Diagnosis Date Noted  . Primary osteoarthritis of left knee 10/04/2015  . Osteoporosis 08/23/2015  . Multinodular goiter (nontoxic) 04/07/2014  . Diabetes mellitus type 2, uncontrolled, without complications ([place]) 02/54/8628  . Insulin adverse reaction 09/27/2013  . Senile nuclear sclerosis 08/14/2012  . HTN, goal below 130/80 03/10/2011  . Hyperlipidemia with target LDL less than 70 03/10/2011  . Obesity (BMI 30.0-34.9) 03/10/2011  . Health care maintenance 03/10/2011    Percival Spanish, PT, MPT 03/10/2016, 12:00 PM  [facility] [address]  [place], [place], [address] Phone: [phone]   Fax:  [phone]  Name: Chanee Henrickson MRN: [id number] Date of Birth: [birth year]

## 2016-03-13 ENCOUNTER — Ambulatory Visit: Payer: Medicare Other

## 2016-03-13 DIAGNOSIS — R262 Difficulty in walking, not elsewhere classified: Secondary | ICD-10-CM

## 2016-03-13 DIAGNOSIS — M6281 Muscle weakness (generalized): Secondary | ICD-10-CM

## 2016-03-13 DIAGNOSIS — M25562 Pain in left knee: Secondary | ICD-10-CM

## 2016-03-13 DIAGNOSIS — M25662 Stiffness of left knee, not elsewhere classified: Secondary | ICD-10-CM | POA: Diagnosis not present

## 2016-03-13 NOTE — Therapy (Signed)
[facility] [address]  [place], [place], [address] Phone: [phone]   Fax:  [phone]  Physical Therapy Treatment  Patient Details  Name: Wanda [name] MRN: [id number] Date of Birth: 02-Jul-1940 Referring Provider: Dr. Alta Corning  Encounter Date: 03/13/2016      PT End of Session - 03/13/16 1416    Visit Number 12   Number of Visits 18   Date for PT Re-Evaluation 03/24/16   Authorization Type Medicare   PT Start Time 1401   PT Stop Time 1443   PT Time Calculation (min) 42 min   Activity Tolerance Patient tolerated treatment well   Behavior During Therapy [facility] for tasks assessed/performed      Past Medical History:  Diagnosis Date  . Arthritis   . Cataract   . Diabetes mellitus   . Hypertension   . Skin cancer    Removed from face  . Urgency of urination   . Urinary leakage     Past Surgical History:  Procedure Laterality Date  . ABDOMINAL HYSTERECTOMY  early 80's   total  . CARPAL TUNNEL RELEASE Bilateral 1980 and 1981   both hands  . EYE SURGERY     bilateral cataracts  . Fatty Tumor Excision    . JOINT REPLACEMENT    . TONSILLECTOMY    . TONSILLECTOMY AND ADENOIDECTOMY  age 61  . TOTAL KNEE ARTHROPLASTY Right 10/04/2015  . TOTAL KNEE ARTHROPLASTY Right 10/04/2015   Procedure: TOTAL KNEE ARTHROPLASTY;  Surgeon: Dorna Leitz, MD;  Location: [place];  Service: Orthopedics;  Laterality: Right;  . TOTAL KNEE ARTHROPLASTY Left 01/28/2016   Procedure: TOTAL KNEE ARTHROPLASTY;  Surgeon: Dorna Leitz, MD;  Location: [place];  Service: Orthopedics;  Laterality: Left;  . TUBAL LIGATION      There were no vitals filed for this visit.      Subjective Assessment - 03/13/16 1655    Subjective Pt. noting increased L knee pain today without known trigger.     Currently in Pain? 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;2 reps   Company secretary     Knee/Hip Exercises: Standing   Hip Flexion Left;Knee straight;15 reps   Hip Flexion Limitations red TB, single UE on back of chair   Hip ADduction Left;15 reps   Hip ADduction Limitations red TB, single UE on back of chair   Hip Abduction Left;Knee straight;15 reps   Abduction Limitations red TB, single UE on back of chair   Hip Extension Left;Knee straight;15 reps   Extension Limitations red TB, single UE on back of chair   Lateral Step Up Left;15 reps;Hand Hold: 1;Step Height: 8"  increased pain with this today   Lateral Step Up Limitations 1-2 pole A   Forward Step Up Left;15 reps;Hand Hold: 1;Step Height: 8"   Forward Step Up Limitations 1-2 pole A   Functional Squat 15 reps;1 set   Functional Squat Limitations TRX + toes raises at bottom     Manual Therapy   Manual Therapy Joint mobilization;Muscle Energy Technique;Passive ROM   Joint Mobilization L knee A/P &  P/A mobs in sitting and supine for flexion/extension; patellar mobs all directions with emphasis on superior/inferior for increased ROM   Passive ROM moderately aggressive stretch into flexion/extension with therapist            PT Short Term Goals - 02/21/16 1054      PT SHORT TERM GOAL #1   Title pt will be independent with initial HEP by 02/25/16   Status Achieved           PT Long Term Goals - 03/08/16 1123      PT LONG TERM GOAL #1   Title Pt will be indepedent with advanced HEP by 03/24/16   Status Partially Met  2.28.18: met for current      PT LONG TERM GOAL #2   Title pt will have L knee AROM 3-115 dg to allow for normal gait mechanics by 03/24/16.   Status On-going  2.28.18:  7-101 dg AROM (4-109 dg PROM)     PT LONG TERM GOAL #3   Title pt will have L hip and knee strength  >/= 4/5 for improved function by 03/24/16.   Status Partially Met  2.28.18: met except 4-/5 L hip extension     PT LONG TERM GOAL #4   Title Pt will be able to ambulate with normal gait pattern without AD by 03/24/16.   Status Partially Met  2.28.18: Pt. still with slight forward trunk lean and shortened step length on R without AD, pain free however                 Plan - 03/13/16 1445    Clinical Impression Statement Pt. reporting increased L knee pain today without known trigger.  Stepping and squatting activities continued today without significant progression due to pt. report and demonstration of difficulty with these.  4-way SLR progressed today and tolerated well.  Manual for increased knee ROM continued with pt. tolerating well.  Pt. reporting moderate pain levels to end treatment however declining ice reporting she will ice at home.  Pt. will continue to benefit from further skilled therapy to maximize ROM, strength, and function.     PT Treatment/Interventions Patient/family education;ADLs/Self Care Home Management;Therapeutic exercise;Scar mobilization;Gait training;Stair training;Neuromuscular re-education;Cryotherapy;Vasopneumatic Device;Taping;Passive range of motion;Balance training;Manual techniques;Functional mobility training;Moist Heat;Iontophoresis 43m/ml Dexamethasone   PT Next Visit Plan Continue knee range of motion exercises and hip & knee strengthening; gait training; manual therapy & modalities PRN      Patient will benefit from skilled therapeutic intervention in order to improve the following deficits and impairments:  Pain, Abnormal gait, Decreased activity tolerance, Decreased range of motion, Decreased strength, Difficulty walking, Increased edema, Impaired flexibility, Decreased mobility, Decreased scar mobility, Decreased balance  Visit Diagnosis: Acute pain of left knee  Stiffness of left knee, not elsewhere classified  Muscle weakness  (generalized)  Difficulty in walking, not elsewhere classified     Problem List Patient Active Problem List   Diagnosis Date Noted  . Primary osteoarthritis of left knee 10/04/2015  . Osteoporosis 08/23/2015  . Multinodular goiter (nontoxic) 04/07/2014  . Diabetes mellitus type 2, uncontrolled, without complications ([place] 110/62/6948 . Insulin adverse reaction 09/27/2013  . Senile nuclear sclerosis 08/14/2012  . HTN, goal below 130/80 03/10/2011  . Hyperlipidemia with target LDL less than 70 03/10/2011  . Obesity (BMI 30.0-34.9) 03/10/2011  . Health care maintenance 03/10/2011    MBess Harvest PTA 03/13/16 5:02 PM   [facility]  [place], [place], [address] Phone: [phone]   Fax:  [phone]  Name: Wanda [name] MRN: [id number] Date of Birth: May 29, 1940

## 2016-03-15 ENCOUNTER — Ambulatory Visit: Payer: Medicare Other

## 2016-03-15 DIAGNOSIS — M25662 Stiffness of left knee, not elsewhere classified: Secondary | ICD-10-CM

## 2016-03-15 DIAGNOSIS — M25562 Pain in left knee: Secondary | ICD-10-CM

## 2016-03-15 DIAGNOSIS — M6281 Muscle weakness (generalized): Secondary | ICD-10-CM | POA: Diagnosis not present

## 2016-03-15 DIAGNOSIS — R262 Difficulty in walking, not elsewhere classified: Secondary | ICD-10-CM | POA: Diagnosis not present

## 2016-03-15 NOTE — Therapy (Signed)
[facility] [address]  [place], [place], [address] Phone: [phone]   Fax:  [phone]  Physical Therapy Treatment  Patient Details  Name: Wanda [name] MRN: [id number] Date of Birth: Jan 02, 1941 Referring Provider: Dr. Alta Corning  Encounter Date: 03/15/2016      PT End of Session - 03/15/16 1400    Visit Number 13   Number of Visits 18   Date for PT Re-Evaluation 03/24/16   Authorization Type Medicare   PT Start Time 1324   PT Stop Time 1443   PT Time Calculation (min) 46 min   Activity Tolerance Patient tolerated treatment well   Behavior During Therapy [facility] for tasks assessed/performed      Past Medical History:  Diagnosis Date  . Arthritis   . Cataract   . Diabetes mellitus   . Hypertension   . Skin cancer    Removed from face  . Urgency of urination   . Urinary leakage     Past Surgical History:  Procedure Laterality Date  . ABDOMINAL HYSTERECTOMY  early 80's   total  . CARPAL TUNNEL RELEASE Bilateral 1980 and 1981   both hands  . EYE SURGERY     bilateral cataracts  . Fatty Tumor Excision    . JOINT REPLACEMENT    . TONSILLECTOMY    . TONSILLECTOMY AND ADENOIDECTOMY  age 27  . TOTAL KNEE ARTHROPLASTY Right 10/04/2015  . TOTAL KNEE ARTHROPLASTY Right 10/04/2015   Procedure: TOTAL KNEE ARTHROPLASTY;  Surgeon: Dorna Leitz, MD;  Location: [place];  Service: Orthopedics;  Laterality: Right;  . TOTAL KNEE ARTHROPLASTY Left 01/28/2016   Procedure: TOTAL KNEE ARTHROPLASTY;  Surgeon: Dorna Leitz, MD;  Location: [place];  Service: Orthopedics;  Laterality: Left;  . TUBAL LIGATION      There were no vitals filed for this visit.      Subjective Assessment - 03/15/16 1357    Subjective Pt. reporting her knee feels much better today with less pain.     Patient Stated Goals "to get as much range of motion as possible"   Currently in Pain? Yes   Pain Score 2    Pain Location Knee   Pain  Orientation Left   Pain Descriptors / Indicators Aching   Pain Type Surgical pain   Pain Onset More than a month ago   Pain Frequency Constant   Aggravating Factors  Prolonged standing,    Pain Relieving Factors pain meds, ice   Effect of Pain on Daily Activities not going out as much    Multiple Pain Sites No                         [facility] Adult PT Treatment/Exercise - 03/15/16 1410      Ambulation/Gait   Ambulation/Gait Yes   Ambulation/Gait Assistance 7: Independent   Ambulation Distance (Feet) 450 Feet   Assistive device None   Gait Pattern Step-through pattern;Decreased arm swing - right;Decreased arm swing - left;Decreased step length - right;Decreased stance time - left   Ambulation Surface Level;Indoor   Gait Comments some cueing required for upright posture and even stance time bilaterally      Neuro Re-ed    Neuro Re-ed Details  Standing cone nock over/put back x 16 cones; Supervision from therapist; LOB x 1 with good self correction     Knee/Hip Exercises: Stretches   Quad Stretch 2 reps;60 seconds   [employer]  Stretch Limitations Prone with strap     Knee/Hip Exercises: Aerobic   Stationary Bike lvl 2 x 6'     Knee/Hip Exercises: Standing   Forward Step Up Left;Hand Hold: 1;Step Height: 8";20 reps   Forward Step Up Limitations light UE support on counter    Step Down Right;2 sets;Hand Hold: 1;Step Height: 8";20 reps   Functional Squat 1 set;20 reps   Functional Squat Limitations TRX + toes raise at bottom   Other Standing Knee Exercises Standing side stepping on blue foam balance beam with yellow TB around ankles x 5 laps down back; no UE support; CGA from therapist; at counter      Knee/Hip Exercises: Supine   Other Supine Knee/Hip Exercises HS curl + bridge with heels on peanut p-ball x 15 reps     Manual Therapy   Manual Therapy Muscle Energy Technique;Passive ROM   Passive ROM moderately aggressive stretch into flexion/extension with therapist     Muscle Energy Technique L quad contract/relax in prone with strap and therapist resistance x 3 reps                PT Education - 03/15/16 1445    Education provided Yes   Education Details Prone lying quad stretch    Person(s) Educated Patient   Methods Explanation;Demonstration;Verbal cues;Handout   Comprehension Verbalized understanding;Returned demonstration;Verbal cues required;Need further instruction          PT Short Term Goals - 02/21/16 1054      PT SHORT TERM GOAL #1   Title pt will be independent with initial HEP by 02/25/16   Status Achieved           PT Long Term Goals - 03/08/16 1123      PT LONG TERM GOAL #1   Title Pt will be indepedent with advanced HEP by 03/24/16   Status Partially Met  2.28.18: met for current      PT LONG TERM GOAL #2   Title pt will have L knee AROM 3-115 dg to allow for normal gait mechanics by 03/24/16.   Status On-going  2.28.18:  7-101 dg AROM (4-109 dg PROM)     PT LONG TERM GOAL #3   Title pt will have L hip and knee strength >/= 4/5 for improved function by 03/24/16.   Status Partially Met  2.28.18: met except 4-/5 L hip extension     PT LONG TERM GOAL #4   Title Pt will be able to ambulate with normal gait pattern without AD by 03/24/16.   Status Partially Met  2.28.18: Pt. still with slight forward trunk lean and shortened step length on R without AD, pain free however                 Plan - 03/15/16 1401    Clinical Impression Statement Pt. with less knee pain today initially and with therex.  Today's treatment with increased focus on balance activity and progression of stepping/squatting activities.  Pt. tolerating all activities today well.  Pt. demonstrating tight quad/hip flexor with prone stretch thus this issued to pt. via handout to add to HEP.  ROM not formally measured today however with visible improvement.  Pt. still requiring some cueing for upright posture and even stance time with gait  training today however with good overall technique.  Pt. will continue to benefit from further skilled therapy to improve ROM, strength, and maximize function.     PT Treatment/Interventions Patient/family education;ADLs/Self Care Home Management;Therapeutic exercise;Scar  mobilization;Gait training;Stair training;Neuromuscular re-education;Cryotherapy;Vasopneumatic Device;Taping;Passive range of motion;Balance training;Manual techniques;Functional mobility training;Moist Heat;Iontophoresis 62m/ml Dexamethasone   PT Next Visit Plan Continue knee range of motion exercises and hip & knee strengthening; gait training; manual therapy & modalities PRN      Patient will benefit from skilled therapeutic intervention in order to improve the following deficits and impairments:  Pain, Abnormal gait, Decreased activity tolerance, Decreased range of motion, Decreased strength, Difficulty walking, Increased edema, Impaired flexibility, Decreased mobility, Decreased scar mobility, Decreased balance  Visit Diagnosis: Acute pain of left knee  Stiffness of left knee, not elsewhere classified  Muscle weakness (generalized)  Difficulty in walking, not elsewhere classified     Problem List Patient Active Problem List   Diagnosis Date Noted  . Primary osteoarthritis of left knee 10/04/2015  . Osteoporosis 08/23/2015  . Multinodular goiter (nontoxic) 04/07/2014  . Diabetes mellitus type 2, uncontrolled, without complications ([place] 194/09/500 . Insulin adverse reaction 09/27/2013  . Senile nuclear sclerosis 08/14/2012  . HTN, goal below 130/80 03/10/2011  . Hyperlipidemia with target LDL less than 70 03/10/2011  . Obesity (BMI 30.0-34.9) 03/10/2011  . Health care maintenance 03/10/2011    MBess Harvest PTA 03/15/16 3:06 PM   [facility] [address] [place] [address]Phone: [phone]  Fax:  [phone] Name: Wanda RheaumeMRN: [id number] of Birth: 1Oct 01, 1942

## 2016-03-17 ENCOUNTER — Ambulatory Visit: Payer: Medicare Other | Admitting: Physical Therapy

## 2016-03-17 DIAGNOSIS — M6281 Muscle weakness (generalized): Secondary | ICD-10-CM | POA: Diagnosis not present

## 2016-03-17 DIAGNOSIS — M25662 Stiffness of left knee, not elsewhere classified: Secondary | ICD-10-CM

## 2016-03-17 DIAGNOSIS — R262 Difficulty in walking, not elsewhere classified: Secondary | ICD-10-CM

## 2016-03-17 DIAGNOSIS — M25562 Pain in left knee: Secondary | ICD-10-CM | POA: Diagnosis not present

## 2016-03-17 NOTE — Therapy (Signed)
[facility] [address]  [place], [place], [address] Phone: [phone]   Fax:  [phone]  Physical Therapy Treatment  Patient Details  Name: Wanda [name] MRN: [id number] Date of Birth: [birth year] Referring Provider: Dr. Alta Corning  Encounter Date: 03/17/2016      PT End of Session - 03/17/16 1002    Visit Number 14   Number of Visits 18   Date for PT Re-Evaluation 03/24/16   Authorization Type Medicare   PT Start Time 1002   PT Stop Time 1048   PT Time Calculation (min) 46 min   Activity Tolerance Patient tolerated treatment well   Behavior During Therapy [facility] for tasks assessed/performed      Past Medical History:  Diagnosis Date  . Arthritis   . Cataract   . Diabetes mellitus   . Hypertension   . Skin cancer    Removed from face  . Urgency of urination   . Urinary leakage     Past Surgical History:  Procedure Laterality Date  . ABDOMINAL HYSTERECTOMY  early 80's   total  . CARPAL TUNNEL RELEASE Bilateral 1980 and 1981   both hands  . EYE SURGERY     bilateral cataracts  . Fatty Tumor Excision    . JOINT REPLACEMENT    . TONSILLECTOMY    . TONSILLECTOMY AND ADENOIDECTOMY  age 10  . TOTAL KNEE ARTHROPLASTY Right 10/04/2015  . TOTAL KNEE ARTHROPLASTY Right 10/04/2015   Procedure: TOTAL KNEE ARTHROPLASTY;  Surgeon: Dorna Leitz, MD;  Location: [place];  Service: Orthopedics;  Laterality: Right;  . TOTAL KNEE ARTHROPLASTY Left 01/28/2016   Procedure: TOTAL KNEE ARTHROPLASTY;  Surgeon: Dorna Leitz, MD;  Location: [place];  Service: Orthopedics;  Laterality: Left;  . TUBAL LIGATION      There were no vitals filed for this visit.      Subjective Assessment - 03/17/16 1005    Subjective Pt reports MD has given her a prescription for a new pain med but she has not had it filled yet and does not remember the name. Pain better today, 3/10 at [place] this morning.   Pertinent History R TKA 10/04/15; L  TKA 01/28/16   Patient Stated Goals "to get as much range of motion as possible"   Currently in Pain? Yes   Pain Score --  1.5/10   Pain Location Knee   Pain Orientation Left   Pain Descriptors / Indicators Aching            [facility] PT Assessment - 03/17/16 1002      Assessment   Medical Diagnosis S/P L TKA   Referring Provider Dr. Alta Corning   Onset Date/Surgical Date 01/28/16   Next MD Visit 3-6 months     AROM   Left Knee Extension 4   Left Knee Flexion 110     PROM   Left Knee Extension 3   Left Knee Flexion 112                     [facility] Adult PT Treatment/Exercise - 03/17/16 1002      High Level Balance   High Level Balance Activities Negotiating over obstacles;Side stepping;Tandem walking;Backward walking   High Level Balance Comments Reciprocal stepping over horizontal cones to promote hip & knee flexion with gait; Side-stepping & fwd/back tandem gait on blue balance beam     Knee/Hip Exercises: Aerobic   Stationary Bike lvl 2  x 6'     Knee/Hip Exercises: Machines for Strengthening   Cybex Leg Press 25# x15     Knee/Hip Exercises: Standing   Forward Step Up Left;20 reps;Hand Hold: 2;Step Height: 8"   Forward Step Up Limitations 1st 5 reps - normal pattern; last 15 + TKE with blue TB   Step Down Left;10 reps;Hand Hold: 2;Step Height: 4"   Step Down Limitations heel touch   Functional Squat 20 reps;3 seconds   Functional Squat Limitations TRX + toes raises   Other Standing Knee Exercises L step-over with 6" step x10, 2 pole assist     Manual Therapy   Manual Therapy Joint mobilization   Joint Mobilization L knee A/P & P/A mobs in sitting and supine for flexion/extension; patellar mobs all directions with emphasis on superior/inferior for increased ROM                  PT Short Term Goals - 02/21/16 1054      PT SHORT TERM GOAL #1   Title pt will be independent with initial HEP by 02/25/16   Status Achieved           PT  Long Term Goals - 03/08/16 1123      PT LONG TERM GOAL #1   Title Pt will be indepedent with advanced HEP by 03/24/16   Status Partially Met  2.28.18: met for current      PT LONG TERM GOAL #2   Title pt will have L knee AROM 3-115 dg to allow for normal gait mechanics by 03/24/16.   Status On-going  2.28.18:  7-101 dg AROM (4-109 dg PROM)     PT LONG TERM GOAL #3   Title pt will have L hip and knee strength >/= 4/5 for improved function by 03/24/16.   Status Partially Met  2.28.18: met except 4-/5 L hip extension     PT LONG TERM GOAL #4   Title Pt will be able to ambulate with normal gait pattern without AD by 03/24/16.   Status Partially Met  2.28.18: Pt. still with slight forward trunk lean and shortened step length on R without AD, pain free however                 Plan - 03/17/16 1009    Clinical Impression Statement Pt progressing well with improving L knee AROM to 4-110 and PROM to 3-112. Pt continues to demonstrate tendency for circumduction with stepping up on step or over obstables, therefore targeted increased hip and knee flexion during these activities to decrease substitution. Pt appears to be on target to wrap up therapy by end of POC, therefore will target HEP review/update over next few visits in preparation for discharge.   Rehab Potential Good   Clinical Impairments Affecting Rehab Potential R TKA 10/04/15   PT Treatment/Interventions Patient/family education;ADLs/Self Care Home Management;Therapeutic exercise;Scar mobilization;Gait training;Stair training;Neuromuscular re-education;Cryotherapy;Vasopneumatic Device;Taping;Passive range of motion;Balance training;Manual techniques;Functional mobility training;Moist Heat;Iontophoresis 54m/ml Dexamethasone   PT Next Visit Plan Continue knee range of motion exercises and hip & knee strengthening; gait training; manual therapy & modalities PRN; HEP review/update +/- gym program in prep for upcoming discharge   Consulted  and Agree with Plan of Care Patient      Patient will benefit from skilled therapeutic intervention in order to improve the following deficits and impairments:  Pain, Abnormal gait, Decreased activity tolerance, Decreased range of motion, Decreased strength, Difficulty walking, Increased edema, Impaired flexibility, Decreased mobility, Decreased scar mobility,  Decreased balance  Visit Diagnosis: Acute pain of left knee  Stiffness of left knee, not elsewhere classified  Muscle weakness (generalized)  Difficulty in walking, not elsewhere classified     Problem List Patient Active Problem List   Diagnosis Date Noted  . Primary osteoarthritis of left knee 10/04/2015  . Osteoporosis 08/23/2015  . Multinodular goiter (nontoxic) 04/07/2014  . Diabetes mellitus type 2, uncontrolled, without complications ([place]) 19/62/2297  . Insulin adverse reaction 09/27/2013  . Senile nuclear sclerosis 08/14/2012  . HTN, goal below 130/80 03/10/2011  . Hyperlipidemia with target LDL less than 70 03/10/2011  . Obesity (BMI 30.0-34.9) 03/10/2011  . Health care maintenance 03/10/2011    Percival Spanish, PT, MPT 03/17/2016, 10:55 AM  [facility] [address]  [place], [place], [address] Phone: [phone]   Fax:  [phone]  Name: Wanda [name] MRN: [id number] Date of Birth: December 30, 1940

## 2016-03-20 ENCOUNTER — Ambulatory Visit: Payer: Medicare Other

## 2016-03-21 ENCOUNTER — Ambulatory Visit: Payer: Medicare Other

## 2016-03-21 DIAGNOSIS — M25662 Stiffness of left knee, not elsewhere classified: Secondary | ICD-10-CM

## 2016-03-21 DIAGNOSIS — M25562 Pain in left knee: Secondary | ICD-10-CM | POA: Diagnosis not present

## 2016-03-21 DIAGNOSIS — M6281 Muscle weakness (generalized): Secondary | ICD-10-CM | POA: Diagnosis not present

## 2016-03-21 DIAGNOSIS — R262 Difficulty in walking, not elsewhere classified: Secondary | ICD-10-CM

## 2016-03-21 NOTE — Therapy (Signed)
[facility] [address]  [place], [place], [address] Phone: [phone]   Fax:  [phone]  Physical Therapy Treatment  Patient Details  Name: Wanda [name] MRN: [id number] Date of Birth: Apr 18, 1940 Referring Provider: Dr. Alta Corning  Encounter Date: 03/21/2016      PT End of Session - 03/21/16 1008    Visit Number 15   Number of Visits 18   Date for PT Re-Evaluation 03/24/16   Authorization Type Medicare   PT Start Time 1008   PT Stop Time 1105   PT Time Calculation (min) 57 min   Activity Tolerance Patient tolerated treatment well   Behavior During Therapy [facility] for tasks assessed/performed      Past Medical History:  Diagnosis Date  . Arthritis   . Cataract   . Diabetes mellitus   . Hypertension   . Skin cancer    Removed from face  . Urgency of urination   . Urinary leakage     Past Surgical History:  Procedure Laterality Date  . ABDOMINAL HYSTERECTOMY  early 80's   total  . CARPAL TUNNEL RELEASE Bilateral 1980 and 1981   both hands  . EYE SURGERY     bilateral cataracts  . Fatty Tumor Excision    . JOINT REPLACEMENT    . TONSILLECTOMY    . TONSILLECTOMY AND ADENOIDECTOMY  age 76  . TOTAL KNEE ARTHROPLASTY Right 10/04/2015  . TOTAL KNEE ARTHROPLASTY Right 10/04/2015   Procedure: TOTAL KNEE ARTHROPLASTY;  Surgeon: Dorna Leitz, MD;  Location: [place];  Service: Orthopedics;  Laterality: Right;  . TOTAL KNEE ARTHROPLASTY Left 01/28/2016   Procedure: TOTAL KNEE ARTHROPLASTY;  Surgeon: Dorna Leitz, MD;  Location: [place];  Service: Orthopedics;  Laterality: Left;  . TUBAL LIGATION      There were no vitals filed for this visit.      Subjective Assessment - 03/21/16 1008    Subjective Pt. reporting new pain meds are not giving her much relief however some decreased stiffness today.     Patient Stated Goals "to get as much range of motion as possible"   Currently in Pain? Yes   Pain Score 4     Pain Location Knee   Pain Orientation Left   Pain Descriptors / Indicators Aching   Pain Type Surgical pain   Pain Onset More than a month ago   Pain Frequency Constant   Multiple Pain Sites No                         [facility] Adult PT Treatment/Exercise - 03/21/16 1013      Ambulation/Gait   Ambulation/Gait Yes   Ambulation/Gait Assistance 7: Independent   Ambulation Distance (Feet) 360 Feet   Assistive device None   Gait Pattern Step-through pattern;Decreased arm swing - right;Decreased arm swing - left;Decreased step length - right;Decreased stance time - left   Ambulation Surface Level;Indoor   Gait Comments cueing for even stance time bilaterally, increased step length, and reciprocal arm swing      Knee/Hip Exercises: Stretches   Sports administrator 2 reps;60 seconds   Quad Stretch Limitations Prone with strap  cues to apply strap prior to stretch for HEP     Knee/Hip Exercises: Aerobic   Stationary Bike lvl 2 x 6'     Knee/Hip Exercises: Standing   Hip Flexion Left;Knee straight;5 reps   Hip Flexion Limitations red looped TB; holding onto  chair; red TB added to HEP    Hip Abduction 5 reps;1 set;Knee straight;Right;Left;Stengthening   Abduction Limitations red looped TB; holding onto chair; red looped TB added to HEP    Hip Extension Left;Knee straight;5 reps   Extension Limitations red looped TB; holding onto chair; red looped TB added to HEP    Forward Step Up Left;20 reps;Step Height: 8";Hand Hold: 1;2 sets  1 UE support on ski pole    Forward Step Up Limitations 2nd set with blue TB pulling knee into flexion    Step Down Left;10 reps;Step Height: 6";2 sets;Step Height: 4";Hand Hold: 2  2 UE support on ski poles    Step Down Limitations first set attempted on 6" step however poor form    Functional Squat 20 reps;3 seconds   Functional Squat Limitations TRX + toes raises at bottom of squat   Other Standing Knee Exercises side stepping with red looped TB  around ankles 2 x 20 ft      Manual Therapy   Manual Therapy Joint mobilization;Passive ROM;Soft tissue mobilization   Joint Mobilization L knee A/P & P/A mobs in supine for flexion/extension; patellar mobs all directions with emphasis on superior/inferior for increased ROM   Soft tissue mobilization gentle strumming to L quad/hip flexor in mod thomas stretch position    Passive ROM Aggressive stretch into flexion/extension with therapist                 PT Education - 03/21/16 1043    Education provided Yes   Education Details side stepping with red looped TB issued to pt.    Person(s) Educated Patient   Methods Explanation;Demonstration;Verbal cues;Handout   Comprehension Verbalized understanding;Returned demonstration;Verbal cues required;Need further instruction          PT Short Term Goals - 02/21/16 1054      PT SHORT TERM GOAL #1   Title pt will be independent with initial HEP by 02/25/16   Status Achieved           PT Long Term Goals - 03/08/16 1123      PT LONG TERM GOAL #1   Title Pt will be indepedent with advanced HEP by 03/24/16   Status Partially Met  2.28.18: met for current      PT LONG TERM GOAL #2   Title pt will have L knee AROM 3-115 dg to allow for normal gait mechanics by 03/24/16.   Status On-going  2.28.18:  7-101 dg AROM (4-109 dg PROM)     PT LONG TERM GOAL #3   Title pt will have L hip and knee strength >/= 4/5 for improved function by 03/24/16.   Status Partially Met  2.28.18: met except 4-/5 L hip extension     PT LONG TERM GOAL #4   Title Pt will be able to ambulate with normal gait pattern without AD by 03/24/16.   Status Partially Met  2.28.18: Pt. still with slight forward trunk lean and shortened step length on R without AD, pain free however                 Plan - 03/21/16 1010    Clinical Impression Statement Today's treatment focusing on HEP review/update to prepare for anticipated d/c at end of POC.  Pt.  reporting difficulty with prone stretch with strap today thus proper technique with this reviewed.  Red looped TB added to standing 3-way SLR with pt. confirming appropriate difficulty with demo.  Sidestepping added to HEP with  red looped TB issued to pt.  Pt. able to demo good technique however requiring some cueing today.  Manual stretching and joint mobilizations continued today to improve ROM.  Stepping with mild progression today with pt. confirming good challenge level.  Pt. declining ice to end treatment with report of decreased pain level following therex.  Pt. will continue to benefit from further skilled therapy to prepare for post-d/c program and maximize function.   PT Treatment/Interventions Patient/family education;ADLs/Self Care Home Management;Therapeutic exercise;Scar mobilization;Gait training;Stair training;Neuromuscular re-education;Cryotherapy;Vasopneumatic Device;Taping;Passive range of motion;Balance training;Manual techniques;Functional mobility training;Moist Heat;Iontophoresis 18m/ml Dexamethasone   PT Next Visit Plan Review side-stepping HEP update Continue knee range of motion exercises and hip & knee strengthening; gait training; manual therapy & modalities PRN; HEP review/update +/- gym program in prep for upcoming discharge      Patient will benefit from skilled therapeutic intervention in order to improve the following deficits and impairments:  Pain, Abnormal gait, Decreased activity tolerance, Decreased range of motion, Decreased strength, Difficulty walking, Increased edema, Impaired flexibility, Decreased mobility, Decreased scar mobility, Decreased balance  Visit Diagnosis: Acute pain of left knee  Stiffness of left knee, not elsewhere classified  Muscle weakness (generalized)  Difficulty in walking, not elsewhere classified     Problem List Patient Active Problem List   Diagnosis Date Noted  . Primary osteoarthritis of left knee 10/04/2015  . Osteoporosis  08/23/2015  . Multinodular goiter (nontoxic) 04/07/2014  . Diabetes mellitus type 2, uncontrolled, without complications [place] 158/30/9407 . Insulin adverse reaction 09/27/2013  . Senile nuclear sclerosis 08/14/2012  . HTN, goal below 130/80 03/10/2011  . Hyperlipidemia with target LDL less than 70 03/10/2011  . Obesity (BMI 30.0-34.9) 03/10/2011  . Health care maintenance 03/10/2011    MBess Harvest PTA 03/21/16 11:34 AM   [facility] [place] [address]Phone: [phone]  Fax:  [phone] Name: RAndilynn DelavegaMRN: [id number] of Birth: [birth year]

## 2016-03-22 ENCOUNTER — Ambulatory Visit: Payer: Medicare Other

## 2016-03-22 DIAGNOSIS — R262 Difficulty in walking, not elsewhere classified: Secondary | ICD-10-CM

## 2016-03-22 DIAGNOSIS — M25662 Stiffness of left knee, not elsewhere classified: Secondary | ICD-10-CM

## 2016-03-22 DIAGNOSIS — M6281 Muscle weakness (generalized): Secondary | ICD-10-CM

## 2016-03-22 DIAGNOSIS — M25562 Pain in left knee: Secondary | ICD-10-CM

## 2016-03-22 NOTE — Therapy (Signed)
[facility] [address]  [place], [place], [address] Phone: [phone]   Fax:  [phone]  Physical Therapy Treatment  Patient Details  Name: Wanda [name] MRN: [id number] Date of Birth: 27-Sep-1940 Referring Provider: Dr. Alta Corning  Encounter Date: 03/22/2016      PT End of Session - 03/22/16 1400    Visit Number 16   Number of Visits 18   Date for PT Re-Evaluation 03/24/16   Authorization Type Medicare   PT Start Time 1400   PT Stop Time 1448   PT Time Calculation (min) 48 min   Activity Tolerance Patient tolerated treatment well   Behavior During Therapy [facility] for tasks assessed/performed      Past Medical History:  Diagnosis Date  . Arthritis   . Cataract   . Diabetes mellitus   . Hypertension   . Skin cancer    Removed from face  . Urgency of urination   . Urinary leakage     Past Surgical History:  Procedure Laterality Date  . ABDOMINAL HYSTERECTOMY  early 80's   total  . CARPAL TUNNEL RELEASE Bilateral 1980 and 1981   both hands  . EYE SURGERY     bilateral cataracts  . Fatty Tumor Excision    . JOINT REPLACEMENT    . TONSILLECTOMY    . TONSILLECTOMY AND ADENOIDECTOMY  age 66  . TOTAL KNEE ARTHROPLASTY Right 10/04/2015  . TOTAL KNEE ARTHROPLASTY Right 10/04/2015   Procedure: TOTAL KNEE ARTHROPLASTY;  Surgeon: Dorna Leitz, MD;  Location: [place];  Service: Orthopedics;  Laterality: Right;  . TOTAL KNEE ARTHROPLASTY Left 01/28/2016   Procedure: TOTAL KNEE ARTHROPLASTY;  Surgeon: Dorna Leitz, MD;  Location: [place];  Service: Orthopedics;  Laterality: Left;  . TUBAL LIGATION      There were no vitals filed for this visit.      Subjective Assessment - 03/22/16 1407    Subjective Pt. reporting increased pain since yesterday's treatment.     Patient Stated Goals "to get as much range of motion as possible"   Currently in Pain? Yes   Pain Score 5    Pain Location Knee   Pain Orientation  Left   Pain Descriptors / Indicators Aching   Pain Type Surgical pain   Pain Radiating Towards n/a   Pain Onset More than a month ago   Pain Frequency Constant   Aggravating Factors  prolonged standing   Pain Relieving Factors pain meds, ice    Multiple Pain Sites No                         [facility] Adult PT Treatment/Exercise - 03/22/16 1414      Knee/Hip Exercises: Stretches   Passive Hamstring Stretch 2 reps;30 seconds;Left   Passive Hamstring Stretch Limitations with strap    Quad Stretch 60 seconds;1 rep  discussion of performance with gym equipement    Quad Stretch Limitations Prone with strap   Gastroc Stretch Left;30 seconds;2 reps   Gastroc Stretch Limitations standing leaning into wall      Knee/Hip Exercises: Aerobic   Stationary Bike lvl 2 x 6'     Knee/Hip Exercises: Machines for Strengthening   Cybex Knee Extension 25# x 15 reps; B LE's   discussion of performance at gym    Cybex Knee Flexion 35# x 15 reps  discussion of performance at gym    Cybex Leg Press 35# x  15 reps; B LE's   discussion of performance at gym      Knee/Hip Exercises: Standing   Heel Raises Both;20 reps;3 seconds   Heel Raises Limitations at [place] 20 reps;3 seconds  discussion of performance at [place] Limitations TRX + toes raises at bottom of squat   Other Standing Knee Exercises side stepping with red looped TB around ankles 2 x 30 ft   good technique; cues for increased knee bend      Vasopneumatic   Number Minutes Vasopneumatic  10 minutes   Vasopnuematic Location  Knee   Vasopneumatic Pressure Medium   Vasopneumatic Temperature  lowest                  PT Short Term Goals - 02/21/16 1054      PT SHORT TERM GOAL #1   Title pt will be independent with initial HEP by 02/25/16   Status Achieved           PT Long Term Goals - 03/08/16 1123      PT LONG TERM GOAL #1   Title Pt will be indepedent with  advanced HEP by 03/24/16   Status Partially Met  2.28.18: met for current      PT LONG TERM GOAL #2   Title pt will have L knee AROM 3-115 dg to allow for normal gait mechanics by 03/24/16.   Status On-going  2.28.18:  7-101 dg AROM (4-109 dg PROM)     PT LONG TERM GOAL #3   Title pt will have L hip and knee strength >/= 4/5 for improved function by 03/24/16.   Status Partially Met  2.28.18: met except 4-/5 L hip extension     PT LONG TERM GOAL #4   Title Pt will be able to ambulate with normal gait pattern without AD by 03/24/16.   Status Partially Met  2.28.18: Pt. still with slight forward trunk lean and shortened step length on R without AD, pain free however                 Plan - 03/22/16 1405    Clinical Impression Statement Today's treatment focusing on review of HEP and post-d/c gym program to prepare pt. for upcoming d/c next visit.  Pt. verbalizing understanding and agreeing with plan for d/c next visit reporting she feels comfortable transitioning to home/gym program.  Machine strengthening reviewed with pt. today along with use of local gym membership, which provides [employer].  Standing calf stretch and heel raise added to HEP and issued to pt. via handout today.  Pt. able to demo updated HEP activities with good technique today.  Will plan to monitor pt. response to updated HEP and answer pt. questions next visit.     PT Treatment/Interventions Patient/family education;ADLs/Self Care Home Management;Therapeutic exercise;Scar mobilization;Gait training;Stair training;Neuromuscular re-education;Cryotherapy;Vasopneumatic Device;Taping;Passive range of motion;Balance training;Manual techniques;Functional mobility training;Moist Heat;Iontophoresis 31m/ml Dexamethasone   PT Next Visit Plan Review updated HEP (standing calf stretch, heel raise), address goals, d/c       Patient will benefit from skilled therapeutic intervention in order to improve the following  deficits and impairments:  Pain, Abnormal gait, Decreased activity tolerance, Decreased range of motion, Decreased strength, Difficulty walking, Increased edema, Impaired flexibility, Decreased mobility, Decreased scar mobility, Decreased balance  Visit Diagnosis: Acute pain of left knee  Stiffness of left knee, not elsewhere classified  Muscle weakness (generalized)  Difficulty in walking, not elsewhere  classified     Problem List Patient Active Problem List   Diagnosis Date Noted  . Primary osteoarthritis of left knee 10/04/2015  . Osteoporosis 08/23/2015  . Multinodular goiter (nontoxic) 04/07/2014  . Diabetes mellitus type 2, uncontrolled, without complications ([place]) 54/65/6812  . Insulin adverse reaction 09/27/2013  . Senile nuclear sclerosis 08/14/2012  . HTN, goal below 130/80 03/10/2011  . Hyperlipidemia with target LDL less than 70 03/10/2011  . Obesity (BMI 30.0-34.9) 03/10/2011  . Health care maintenance 03/10/2011    Bess Harvest, PTA 03/22/16 4:51 PM  [facility] [address]  [place], [place], [address] Phone: [phone]   Fax:  [phone]  Name: Wanda [name] MRN: [id number] Date of Birth: 1940/06/05

## 2016-03-24 ENCOUNTER — Ambulatory Visit: Payer: Medicare Other | Admitting: Physical Therapy

## 2016-03-24 DIAGNOSIS — R262 Difficulty in walking, not elsewhere classified: Secondary | ICD-10-CM

## 2016-03-24 DIAGNOSIS — M25662 Stiffness of left knee, not elsewhere classified: Secondary | ICD-10-CM

## 2016-03-24 DIAGNOSIS — M6281 Muscle weakness (generalized): Secondary | ICD-10-CM | POA: Diagnosis not present

## 2016-03-24 DIAGNOSIS — M25562 Pain in left knee: Secondary | ICD-10-CM | POA: Diagnosis not present

## 2016-03-24 NOTE — Therapy (Signed)
[facility] [address]  [place], [place], [address] Phone: [phone]   Fax:  [phone]  Physical Therapy Treatment  Patient Details  Name: Wanda [name] MRN: [id number] Date of Birth: 1940-05-18 Referring Provider: Dr. Alta Corning  Encounter Date: 03/24/2016      PT End of Session - 03/24/16 1008    Visit Number 17   Number of Visits 18   Date for PT Re-Evaluation 03/24/16   Authorization Type Medicare   PT Start Time 1008   PT Stop Time 6701   PT Time Calculation (min) 39 min   Activity Tolerance Patient tolerated treatment well   Behavior During Therapy [facility] for tasks assessed/performed      Past Medical History:  Diagnosis Date  . Arthritis   . Cataract   . Diabetes mellitus   . Hypertension   . Skin cancer    Removed from face  . Urgency of urination   . Urinary leakage     Past Surgical History:  Procedure Laterality Date  . ABDOMINAL HYSTERECTOMY  early 80's   total  . CARPAL TUNNEL RELEASE Bilateral 1980 and 1981   both hands  . EYE SURGERY     bilateral cataracts  . Fatty Tumor Excision    . JOINT REPLACEMENT    . TONSILLECTOMY    . TONSILLECTOMY AND ADENOIDECTOMY  age 83  . TOTAL KNEE ARTHROPLASTY Right 10/04/2015  . TOTAL KNEE ARTHROPLASTY Right 10/04/2015   Procedure: TOTAL KNEE ARTHROPLASTY;  Surgeon: Dorna Leitz, MD;  Location: [facility];  Service: Orthopedics;  Laterality: Right;  . TOTAL KNEE ARTHROPLASTY Left 01/28/2016   Procedure: TOTAL KNEE ARTHROPLASTY;  Surgeon: Dorna Leitz, MD;  Location: [place];  Service: Orthopedics;  Laterality: Left;  . TUBAL LIGATION      There were no vitals filed for this visit.      Subjective Assessment - 03/24/16 1009    Subjective Pt reporting knee feels tighter today. Pt feels like she is doing much better. Finds herself able to do more than prior to either TKR surgery.   Pertinent History R TKA 10/04/15; L TKA 01/28/16   Patient Stated  Goals "to get as much range of motion as possible"   Currently in Pain? Yes   Pain Score 4    Pain Location Knee   Pain Orientation Left   Pain Descriptors / Indicators Aching   Pain Type Surgical pain   Pain Onset More than a month ago   Pain Frequency Constant  but varies in intensity   Aggravating Factors  nothing predictable   Pain Relieving Factors walking, movement, ice   Effect of Pain on Daily Activities not limited by pain            [facility] PT Assessment - 03/24/16 1008      Assessment   Medical Diagnosis L TKA   Referring Provider Dr. Alta Corning   Onset Date/Surgical Date 01/28/16   Next MD Visit 3-6 months     Observation/Other Assessments   Focus on Therapeutic Outcomes (FOTO)  Knee - 58% (42% limitation)     AROM   Left Knee Extension 2   Left Knee Flexion 111     PROM   Left Knee Extension 1   Left Knee Flexion 113     Strength   Right Hip Flexion 4+/5   Right Hip Extension 4/5   Right Hip ABduction 4+/5   Right Hip ADduction  4+/5   Left Hip Flexion 4+/5   Left Hip Extension 4/5   Left Hip ABduction 4+/5   Left Hip ADduction 4+/5   Right Knee Flexion 4+/5   Right Knee Extension 4+/5   Left Knee Flexion 4+/5   Left Knee Extension 4+/5                     [facility] Adult PT Treatment/Exercise - 17-Apr-2016 1008      Knee/Hip Exercises: Stretches   Gastroc Stretch Left;30 seconds;2 reps   Gastroc Stretch Limitations standing leaning into wall      Knee/Hip Exercises: Aerobic   Stationary Bike lvl 2 x 6'     Knee/Hip Exercises: Standing   Heel Raises Both;20 reps;3 seconds   Heel Raises Limitations at UBE   Other Standing Knee Exercises B side stepping with red looped TB around ankles 2 x 25 ft  good technique; cues for increased knee bend                   PT Short Term Goals - 02/21/16 1054      PT SHORT TERM GOAL #1   Title pt will be independent with initial HEP by 02/25/16   Status Achieved           PT  Long Term Goals - 04-17-2016 1014      PT LONG TERM GOAL #1   Title Pt will be indepedent with advanced HEP by Apr 17, 2016   Status Achieved     PT LONG TERM GOAL #2   Title pt will have L knee AROM 3-115 dg to allow for normal gait mechanics by 04/17/2016.   Status Partially Met  AROM 2-111, PROM 1-113     PT LONG TERM GOAL #3   Title pt will have L hip and knee strength >/= 4/5 for improved function by 2016/04/17.   Status Achieved     PT LONG TERM GOAL #4   Title Pt will be able to ambulate with normal gait pattern without AD by 17-Apr-2016.   Status Achieved               Plan - April 17, 2016 1014    Clinical Impression Statement Pt has demonstrated excellent progress with PT s/p L TKR. L knee AROM 2-111 & PROM 1-113 with B hip and knee strength 4+/5 except B hip extension 4/5. Pt ambulating with normal gait pattern w/o AD and reports no limitations in daily activtity due to pain, weakness or LOM. All goals met except knee flexion ROM just shy of 115 dg. Pt please with progress and in agreement with plan for discharge from PT for this episode.   PT Treatment/Interventions Patient/family education;ADLs/Self Care Home Management;Therapeutic exercise;Scar mobilization;Gait training;Stair training;Neuromuscular re-education;Cryotherapy;Vasopneumatic Device;Taping;Passive range of motion;Balance training;Manual techniques;Functional mobility training;Moist Heat;Iontophoresis 11m/ml Dexamethasone   PT Next Visit Plan Discharge   Consulted and Agree with Plan of Care Patient      Patient will benefit from skilled therapeutic intervention in order to improve the following deficits and impairments:  Pain, Abnormal gait, Decreased activity tolerance, Decreased range of motion, Decreased strength, Difficulty walking, Increased edema, Impaired flexibility, Decreased mobility, Decreased scar mobility, Decreased balance  Visit Diagnosis: Acute pain of left knee  Stiffness of left knee, not elsewhere  classified  Muscle weakness (generalized)  Difficulty in walking, not elsewhere classified       G-Codes - [id number]    Functional Assessment Tool Used (Outpatient Only) Knee FOTO = 58% (  42% limitation) + clinical judgement based on pt report   Functional Limitation Mobility: Walking and moving around   Mobility: Walking and Moving Around Goal Status [phone]) At least 20 percent but less than 40 percent impaired, limited or restricted   Mobility: Walking and Moving Around Discharge Status [phone]) At least 20 percent but less than 40 percent impaired, limited or restricted      Problem List Patient Active Problem List   Diagnosis Date Noted  . Primary osteoarthritis of left knee 10/04/2015  . Osteoporosis 08/23/2015  . Multinodular goiter (nontoxic) 04/07/2014  . Diabetes mellitus type 2, uncontrolled, without complications ([place]) 47/53/3917  . Insulin adverse reaction 09/27/2013  . Senile nuclear sclerosis 08/14/2012  . HTN, goal below 130/80 03/10/2011  . Hyperlipidemia with target LDL less than 70 03/10/2011  . Obesity (BMI 30.0-34.9) 03/10/2011  . Health care maintenance 03/10/2011    PHYSICAL THERAPY DISCHARGE SUMMARY  Visits from Start of Care: 17  Current functional level related to goals / functional outcomes:   Refer to above clinical impression.   Remaining deficits:   As above   Education / Equipment:   HEP & gym program  Plan: Patient agrees to discharge.  Patient goals were partially met. Patient is being discharged due to not returning since the last visit.  ?????      Percival Spanish, PT, MPT 03/24/2016, 10:54 AM  [facility] [address]  [place], [place], [address] Phone: [phone]   Fax:  [phone]  Name: Wanda [name] MRN: [id number] Date of Birth: Mar 19, 1940

## 2016-05-11 ENCOUNTER — Other Ambulatory Visit (INDEPENDENT_AMBULATORY_CARE_PROVIDER_SITE_OTHER): Payer: Medicare Other

## 2016-05-11 DIAGNOSIS — E1165 Type 2 diabetes mellitus with hyperglycemia: Secondary | ICD-10-CM

## 2016-05-11 DIAGNOSIS — E785 Hyperlipidemia, unspecified: Secondary | ICD-10-CM

## 2016-05-11 DIAGNOSIS — Z794 Long term (current) use of insulin: Secondary | ICD-10-CM

## 2016-05-11 DIAGNOSIS — IMO0001 Reserved for inherently not codable concepts without codable children: Secondary | ICD-10-CM

## 2016-05-11 DIAGNOSIS — D62 Acute posthemorrhagic anemia: Secondary | ICD-10-CM

## 2016-05-11 LAB — LIPID PANEL
CHOL/HDL RATIO: 4
Cholesterol: 243 mg/dL — ABNORMAL HIGH (ref 0–200)
HDL: 56.2 mg/dL (ref >39.00)
LDL CALC: 155 mg/dL — AB (ref 0–99)
NONHDL: 186.7
Triglycerides: 159 mg/dL — ABNORMAL HIGH (ref 0.0–149.0)
VLDL: 31.8 mg/dL (ref 0.0–40.0)

## 2016-05-11 LAB — CBC
HEMATOCRIT: 38 % (ref 36.0–46.0)
HEMOGLOBIN: 12.8 g/dL (ref 12.0–15.0)
MCHC: 33.6 g/dL (ref 30.0–36.0)
MCV: 90.1 fl (ref 78.0–100.0)
PLATELETS: 233 10*3/uL (ref 150.0–400.0)
RBC: 4.22 Mil/uL (ref 3.87–5.11)
RDW: 13.9 % (ref 11.5–15.5)
WBC: 4.2 10*3/uL (ref 4.0–10.5)

## 2016-05-11 LAB — HEMOGLOBIN A1C: HEMOGLOBIN A1C: 8.5 % — AB (ref 4.6–6.5)

## 2016-05-14 ENCOUNTER — Encounter: Payer: Self-pay | Admitting: Family Medicine

## 2016-05-31 ENCOUNTER — Encounter: Payer: Self-pay | Admitting: Family Medicine

## 2016-05-31 MED ORDER — INSULIN GLARGINE 100 UNIT/ML SOLOSTAR PEN: 20.0000 [IU] | PEN_INJECTOR | Freq: Every day | SUBCUTANEOUS | 3 refills | Status: DC

## 2016-06-02 ENCOUNTER — Other Ambulatory Visit: Payer: Self-pay | Admitting: Family Medicine

## 2016-06-06 ENCOUNTER — Telehealth: Payer: Self-pay | Admitting: Family Medicine

## 2016-06-06 NOTE — Telephone Encounter (Signed)
Caller name: [place]  Relation to pt: self  Call back number: [phone] Pharmacy: [facility], [place] - [address] AT [address]  Reason for call: Pt came in office stating that [facility] still has not received rx for Insulin Glargine (LANTUS SOLOSTAR) 100 UNIT/ML Solostar Pen (it was done on the 05-31-2016). Please advise ASAP

## 2016-06-07 ENCOUNTER — Other Ambulatory Visit: Payer: Self-pay | Admitting: Emergency Medicine

## 2016-06-07 MED ORDER — INSULIN GLARGINE 100 UNIT/ML SOLOSTAR PEN: 20.0000 [IU] | PEN_INJECTOR | Freq: Every day | SUBCUTANEOUS | 3 refills | Status: DC

## 2016-06-07 NOTE — Telephone Encounter (Signed)
Rx was sent on 05-31-16 but was set to "No Print" which is why the pharmacy probably never received it. I just resent it to the pharmacy so they should be able to fill it today.

## 2016-06-07 NOTE — Telephone Encounter (Signed)
Pt was informed the below. Pt will call pharmacy to see if they have rx ready.

## 2016-06-09 NOTE — Addendum Note (Signed)
Addendum  created 06/09/16 0911 by Hollis, Kevin D, MD   Sign clinical note    

## 2016-06-12 DIAGNOSIS — E113293 Type 2 diabetes mellitus with mild nonproliferative diabetic retinopathy without macular edema, bilateral: Secondary | ICD-10-CM | POA: Diagnosis not present

## 2016-06-12 DIAGNOSIS — D3132 Benign neoplasm of left choroid: Secondary | ICD-10-CM | POA: Diagnosis not present

## 2016-07-06 DIAGNOSIS — M25562 Pain in left knee: Secondary | ICD-10-CM | POA: Diagnosis not present

## 2016-07-06 DIAGNOSIS — M25561 Pain in right knee: Secondary | ICD-10-CM | POA: Diagnosis not present

## 2016-07-06 DIAGNOSIS — Z96652 Presence of left artificial knee joint: Secondary | ICD-10-CM | POA: Diagnosis not present

## 2016-07-06 DIAGNOSIS — Z96651 Presence of right artificial knee joint: Secondary | ICD-10-CM | POA: Diagnosis not present

## 2016-07-06 DIAGNOSIS — Z09 Encounter for follow-up examination after completed treatment for conditions other than malignant neoplasm: Secondary | ICD-10-CM | POA: Diagnosis not present

## 2016-07-20 DIAGNOSIS — L820 Inflamed seborrheic keratosis: Secondary | ICD-10-CM | POA: Diagnosis not present

## 2016-07-20 DIAGNOSIS — L821 Other seborrheic keratosis: Secondary | ICD-10-CM | POA: Diagnosis not present

## 2016-07-20 DIAGNOSIS — D485 Neoplasm of uncertain behavior of skin: Secondary | ICD-10-CM | POA: Diagnosis not present

## 2016-08-29 NOTE — Progress Notes (Addendum)
[facility] [address], [place], [place] [address] [phone]  Date:  08/31/2016   Name:  Wanda [name]   DOB:  1940-03-24   MRN:  [id number]  PCP:  Darreld Mclean, MD    Chief Complaint: Follow-up; Diabetes (Pt states that she's "found a way to control her blood glucose levels". Pt has been taking her meds as directed); and Nutrition Counseling (Pt states she needs help controlling her diet and her weight )   History of Present Illness:  Wanda [name] is a 76 y.o. very pleasant female patient who presents with the following:  Follow-up on DM today- she is on lantus insulin, and also recently started back on her glipizde.  She had stopped this as it seemed to trigger migraine- however now that she is back taking it she has not had any further HA and is tolerating it find after all  She has had both of her knees replaced and is doing well in this regard- she is getting around better than in the past and is walking more.  Overall she is thrilled with her knees   She has gained some weight back, and is disappointed by this. She has never used any sort of appetite suppressant or diet medication, but would like to try something like this to help her now. She notes that she tries to watch her diet, but is just hungry all the time and overeats.   She had lost weight back in February as she was on pain medication and it made her lose her appetite/ feel nauseated much of the time.  She is glad to be in less pain and off the meds, but has gained about 20 lbs  She is not taking clonidine- in fact she neve did take this    BP Readings from Last 3 Encounters:  08/31/16 (!) 185/77  03/02/16 (!) 142/75  01/31/16 (!) 178/63    Wt Readings from Last 3 Encounters:  08/31/16 170 lb (77.1 kg)  03/02/16 152 lb (68.9 kg)  01/20/16 158 lb 14.4 oz (72.1 kg)    Lab Results  Component Value Date   HGBA1C 8.5 (H) 05/11/2016   Eye  exam:today Foot exam:UTD Mammogram:due soon Pneumonia vaccine:declines  Her son has cancer- has been under treatment for 10 years and has survived. They are starting him on some sort of immunotherapy as a new treatment soon Pt notes that home BP is generally 142/70  Patient Active Problem List   Diagnosis Date Noted  . Primary osteoarthritis of left knee 10/04/2015  . Osteoporosis 08/23/2015  . Multinodular goiter (nontoxic) 04/07/2014  . Diabetes mellitus type 2, uncontrolled, without complications ([place]) 96/75/9163  . Insulin adverse reaction 09/27/2013  . Senile nuclear sclerosis 08/14/2012  . HTN, goal below 130/80 03/10/2011  . Hyperlipidemia with target LDL less than 70 03/10/2011  . Obesity (BMI 30.0-34.9) 03/10/2011  . Health care maintenance 03/10/2011    Past Medical History:  Diagnosis Date  . Arthritis   . Cataract   . Diabetes mellitus   . Hypertension   . Skin cancer    Removed from face  . Urgency of urination   . Urinary leakage     Past Surgical History:  Procedure Laterality Date  . ABDOMINAL HYSTERECTOMY  early 80's   total  . CARPAL TUNNEL RELEASE Bilateral 1980 and 1981   both hands  . EYE SURGERY     bilateral cataracts  .  Fatty Tumor Excision    . JOINT REPLACEMENT    . TONSILLECTOMY    . TONSILLECTOMY AND ADENOIDECTOMY  age 70  . TOTAL KNEE ARTHROPLASTY Right 10/04/2015  . TOTAL KNEE ARTHROPLASTY Right 10/04/2015   Procedure: TOTAL KNEE ARTHROPLASTY;  Surgeon: Dorna Leitz, MD;  Location: [facility];  Service: Orthopedics;  Laterality: Right;  . TOTAL KNEE ARTHROPLASTY Left 01/28/2016   Procedure: TOTAL KNEE ARTHROPLASTY;  Surgeon: Dorna Leitz, MD;  Location: [facility];  Service: Orthopedics;  Laterality: Left;  . TUBAL LIGATION      Social History  Substance Use Topics  . Smoking status: Never Smoker  . Smokeless tobacco: Never Used  . Alcohol use 0.5 oz/week    1 drink(s) per week    Family History  Problem Relation Age of Onset  .  Pancreatitis Father        deceased 72  . Cancer Unknown   . Cancer Brother        GI  . Cancer Mother        liver  . Cancer Son        terminal kidney    Allergies  Allergen Reactions  . Ace Inhibitors Swelling and Cough    Pt had cough and diarrhea with first few doses of medication; also had swelling of right eyelid. Stopped medication on 03/17/11.  . Alendronate Sodium Other (See Comments)    Whole body hurt  . Aspirin Hives  . Glucotrol [Glipizide] Other (See Comments)    Migraine "color flashes" in L eye with dizziness and lightheadedness.  . Lipitor [Atorvastatin Calcium] Other (See Comments)    Whole body hurt  . Metformin And Related     Severe abdominal pain  . Shellfish Allergy     Intolerant of fresh shellfish, reports the reaction is GI upset, denies hives, denies any swelling  Reports that she can tolerate canned seafood.     Medication list has been reviewed and updated.  Current Outpatient Prescriptions on File Prior to Visit  Medication Sig Dispense Refill  . acetaminophen (TYLENOL) 325 MG tablet Take 650 mg by mouth every 6 (six) hours as needed for mild pain.    . BD PEN NEEDLE NANO U/F 32G X 4 MM MISC USE TO INJECT INSULIN ONCE DAILY 100 each 0  . Blood Glucose Monitoring Suppl (ONE TOUCH ULTRA MINI) w/Device KIT Use to test blood sugar daily as instructed. Dx: E11.65 1 each 0  . glucose blood test strip Test blood sugar 3 times a day. Dx code: 250.00 100 each 12  . Insulin Glargine (LANTUS SOLOSTAR) 100 UNIT/ML Solostar Pen Inject 20 Units into the skin daily at 10 pm. 15 mL 3  . Insulin Syringe-Needle U-100 (INSULIN SYRINGE .5CC/31GX5/16") 31G X 5/16" 0.5 ML MISC Use to inject insulin 1 time daily. 90 each 3  . niacin (NIASPAN) 500 MG CR tablet Take 1 tablet (500 mg total) by mouth at bedtime. Increase to 2 tablets after 1 month (Patient taking differently: Take 500 mg by mouth daily. ) 180 tablet 2  . tiZANidine (ZANAFLEX) 2 MG tablet Take 1 tablet (2 mg  total) by mouth every 8 (eight) hours as needed for muscle spasms. 50 tablet 0  . vitamin E 200 UNIT capsule Take 200 Units by mouth daily. Reported on 05/31/2015    . [DISCONTINUED] lisinopril (PRINIVIL,ZESTRIL) 20 MG tablet Take 20 mg by mouth daily.      No current facility-administered medications on file prior to visit.  Review of Systems:  As per HPI- otherwise negative. No fever or chills No nausea, vomiting or diarrhea Appetite is strong  Physical Examination: Vitals:   08/31/16 1019  BP: (!) 185/77  Pulse: 74  Resp: 18  Temp: 98.4 F (36.9 C)  SpO2: 98%   Vitals:   08/31/16 1019  Weight: 170 lb (77.1 kg)  Height: 5' 1"  (1.549 m)   Body mass index is 32.12 kg/m. Ideal Body Weight: Weight in (lb) to have BMI = 25: 132  GEN: WDWN, NAD, Non-toxic, A & O x 3, overweight, otherwise looks well HEENT: Atraumatic, Normocephalic. Neck supple. No masses, No LAD.  Bilateral TM wnl, oropharynx normal.  PEERL,EOMI.   Ears and Nose: No external deformity. CV: RRR, No M/G/R. No JVD. No thrill. No extra heart sounds. PULM: CTA B, no wheezes, crackles, rhonchi. No retractions. No resp. distress. No accessory muscle use. ABD: S, NT, ND, +BS. No rebound. No HSM. EXTR: No c/c/e NEURO Normal gait.  PSYCH: Normally interactive. Conversant. Not depressed or anxious appearing.  Calm demeanor.    Assessment and Plan: Uncontrolled type 2 diabetes mellitus without complication, with long-term current use of insulin (HCC) - Plan: Hemoglobin M8T, Basic metabolic panel, glipiZIDE (GLUCOTROL XL) 5 MG 24 hr tablet, Insulin Pen Needle (BD PEN NEEDLE NANO U/F) 32G X 4 MM MISC  Class 1 obesity due to excess calories with serious comorbidity and body mass index (BMI) of 32.0 to 32.9 in adult - Plan: Lorcaserin HCl ER (BELVIQ XR) 20 MG TB24  Here today for a recheck visit History of diabetes with borderline control- repeat A1c today, refilled her glipizide and insulin pen needles Will be in  touch with her pending labs Continue regular eye exams She would like to try a medication for weight loss- belviq would be a good option for her if not too expensive.  She will let me know how this works for her  Signed Lamar Blinks, MD  Received her labs   Results for orders placed or performed in visit on 08/31/16  Hemoglobin A1c  Result Value Ref Range   Hgb A1c MFr Bld 7.6 (H) 4.6 - 6.5 %  Basic metabolic panel  Result Value Ref Range   Sodium 140 135 - 145 mEq/L   Potassium 4.2 3.5 - 5.1 mEq/L   Chloride 106 96 - 112 mEq/L   CO2 26 19 - 32 mEq/L   Glucose, Bld 131 (H) 70 - 99 mg/dL   BUN 17 6 - 23 mg/dL   Creatinine, Ser 0.79 0.40 - 1.20 mg/dL   Calcium 10.0 8.4 - 10.5 mg/dL   GFR 75.08 >60.00 mL/min   Message to pt- looks good, A1c is improved

## 2016-08-31 ENCOUNTER — Encounter: Payer: Self-pay | Admitting: Family Medicine

## 2016-08-31 ENCOUNTER — Ambulatory Visit (INDEPENDENT_AMBULATORY_CARE_PROVIDER_SITE_OTHER): Payer: Medicare Other | Admitting: Family Medicine

## 2016-08-31 VITALS — BP 160/70 | HR 74 | Temp 98.4°F | Resp 18 | Ht 61.0 in | Wt 170.0 lb

## 2016-08-31 DIAGNOSIS — Z6832 Body mass index (BMI) 32.0-32.9, adult: Secondary | ICD-10-CM | POA: Diagnosis not present

## 2016-08-31 DIAGNOSIS — Z794 Long term (current) use of insulin: Secondary | ICD-10-CM

## 2016-08-31 DIAGNOSIS — E1165 Type 2 diabetes mellitus with hyperglycemia: Secondary | ICD-10-CM

## 2016-08-31 DIAGNOSIS — E6609 Other obesity due to excess calories: Secondary | ICD-10-CM

## 2016-08-31 DIAGNOSIS — IMO0001 Reserved for inherently not codable concepts without codable children: Secondary | ICD-10-CM

## 2016-08-31 LAB — BASIC METABOLIC PANEL
BUN: 17 mg/dL (ref 6–23)
CO2: 26 meq/L (ref 19–32)
Calcium: 10 mg/dL (ref 8.4–10.5)
Chloride: 106 mEq/L (ref 96–112)
Creatinine, Ser: 0.79 mg/dL (ref 0.40–1.20)
GFR: 75.08 mL/min (ref >60.00)
GLUCOSE: 131 mg/dL — AB (ref 70–99)
POTASSIUM: 4.2 meq/L (ref 3.5–5.1)
SODIUM: 140 meq/L (ref 135–145)

## 2016-08-31 LAB — HEMOGLOBIN A1C: Hgb A1c MFr Bld: 7.6 % — ABNORMAL HIGH (ref 4.6–6.5)

## 2016-08-31 MED ORDER — LORCASERIN HCL ER 20 MG PO TB24: 1.0000 | ORAL_TABLET | Freq: Every day | ORAL | 2 refills | Status: DC

## 2016-08-31 MED ORDER — INSULIN PEN NEEDLE 32G X 4 MM MISC: 6 refills | Status: DC

## 2016-08-31 MED ORDER — GLIPIZIDE ER 5 MG PO TB24: 5.0000 mg | ORAL_TABLET | Freq: Every day | ORAL | 3 refills | Status: DC

## 2016-08-31 NOTE — Patient Instructions (Signed)
It was nice to see you again as always!  I will be in touch with your labs asap We will see how your A1c looks today We will start you on Belviq 20 mg once a day for weight loss; let me know how this works for you

## 2016-10-27 DIAGNOSIS — E113293 Type 2 diabetes mellitus with mild nonproliferative diabetic retinopathy without macular edema, bilateral: Secondary | ICD-10-CM | POA: Diagnosis not present

## 2016-10-27 DIAGNOSIS — D3132 Benign neoplasm of left choroid: Secondary | ICD-10-CM | POA: Diagnosis not present

## 2016-12-18 ENCOUNTER — Ambulatory Visit: Payer: Self-pay | Admitting: Family Medicine

## 2017-01-19 DIAGNOSIS — L821 Other seborrheic keratosis: Secondary | ICD-10-CM | POA: Diagnosis not present

## 2017-01-19 DIAGNOSIS — B351 Tinea unguium: Secondary | ICD-10-CM | POA: Diagnosis not present

## 2017-01-19 DIAGNOSIS — D1801 Hemangioma of skin and subcutaneous tissue: Secondary | ICD-10-CM | POA: Diagnosis not present

## 2017-01-19 DIAGNOSIS — L905 Scar conditions and fibrosis of skin: Secondary | ICD-10-CM | POA: Diagnosis not present

## 2017-01-19 DIAGNOSIS — D225 Melanocytic nevi of trunk: Secondary | ICD-10-CM | POA: Diagnosis not present

## 2017-02-26 ENCOUNTER — Telehealth: Payer: Self-pay | Admitting: Emergency Medicine

## 2017-02-26 MED ORDER — BLOOD GLUCOSE METER KIT: PACK | 0 refills | Status: DC

## 2017-02-26 MED ORDER — ONETOUCH ULTRA MINI W/DEVICE KIT: PACK | 0 refills | Status: DC

## 2017-02-26 NOTE — Telephone Encounter (Signed)
Pt called back - she needs a script for one touch ultra so that ins can pay for it.  Please send to [facility] [address]

## 2017-02-26 NOTE — Telephone Encounter (Signed)
Called her back and LMOM She does not actually need an rx for a meter, she can just buy one.  If she needs an rx for insurance reasons that is fine- I will send an rx for a generic meter to her pharmacy, let me know if more specifics needed

## 2017-02-26 NOTE — Addendum Note (Signed)
Addended by: Lamar Blinks C on: 02/26/2017 04:45 PM   Modules accepted: Orders

## 2017-02-26 NOTE — Telephone Encounter (Signed)
Copied from [place]. Topic: Quick Communication - See Telephone Encounter >> Feb 26, 2017 11:26 AM Oneta Rack wrote:  Relation to pt: self Call back number: [phone] Pharmacy: [facility], [place] - [address] AT [address] [phone] (Phone) [phone] (Fax)  Reason for call:  Patient Blood Glucose Monitoring displaying error 2 which means device is defected, patient requesting new Rx, please send to pharmacy

## 2017-02-27 NOTE — Progress Notes (Signed)
Wanda at [facility] [address] Wanda [name], Wanda [name]  Date:  03/01/2017   Name:  Wanda [name]   DOB:  08-19-1940   MRN:  [id number]  PCP:  Darreld Mclean, MD    Chief Complaint: Follow-up (Pt here for diabetic follow up visit. Pt states that she needs a new meter. )   History of Present Illness:  Wanda [name] is a 77 y.o. very pleasant female patient who presents with the following:  Following up on her DM today History of DM, HTN, hyperlipidemia, obesity  Lab Results  Component Value Date   HGBA1C 8.2 (H) 03/01/2017   Due urine microalbumin and A1c today We tried Belviq for her back in the fall- however she had bad SE from this so she stopped taking it.  She does not have a lot of taste and smell to begin with- this seemed to get worse with the belviq  Her glucose meter has been malfunctioning- she needs a new meter, she uses the one touch ultra. I faxed an order to her pharmacy a few days ago but will print out and give her an sx as well She has not been able to check her glucose and is worried about what her A1c will show.  Admits that she has been eating too much sometimes over the winter   BP Readings from Last 3 Encounters:  03/01/17 (!) 160/90  03/01/17 (!) 170/90  08/31/16 (!) 160/70    Patient Active Problem List   Diagnosis Date Noted  . Primary osteoarthritis of left knee 10/04/2015  . Osteoporosis 08/23/2015  . Multinodular goiter (nontoxic) 04/07/2014  . Diabetes mellitus type 2, uncontrolled, without complications ([place]) 40/81/4481  . Insulin adverse reaction 09/27/2013  . Senile nuclear sclerosis 08/14/2012  . HTN, goal below 130/80 03/10/2011  . Hyperlipidemia with target LDL less than 70 03/10/2011  . Obesity (BMI 30.0-34.9) 03/10/2011  . Health care maintenance 03/10/2011    Past Medical History:  Diagnosis Date  . Arthritis   . Cataract   . Diabetes mellitus   .  Hypertension   . Skin cancer    Removed from face  . Urgency of urination   . Urinary leakage     Past Surgical History:  Procedure Laterality Date  . ABDOMINAL HYSTERECTOMY  early 80's   total  . CARPAL TUNNEL RELEASE Bilateral 1980 and 1981   both hands  . EYE SURGERY     bilateral cataracts  . Fatty Tumor Excision    . JOINT REPLACEMENT    . TONSILLECTOMY    . TONSILLECTOMY AND ADENOIDECTOMY  age 1  . TOTAL KNEE ARTHROPLASTY Right 10/04/2015  . TOTAL KNEE ARTHROPLASTY Right 10/04/2015   Procedure: TOTAL KNEE ARTHROPLASTY;  Surgeon: Dorna Leitz, MD;  Location: [place];  Service: Orthopedics;  Laterality: Right;  . TOTAL KNEE ARTHROPLASTY Left 01/28/2016   Procedure: TOTAL KNEE ARTHROPLASTY;  Surgeon: Dorna Leitz, MD;  Location: [place];  Service: Orthopedics;  Laterality: Left;  . TUBAL LIGATION      Social History   Tobacco Use  . Smoking status: Never Smoker  . Smokeless tobacco: Never Used  Substance Use Topics  . Alcohol use: Yes    Alcohol/week: 0.5 oz    Types: 1 Standard drinks or equivalent per week  . Drug use: No    Family History  Problem Relation Age of Onset  . Pancreatitis Father  deceased 66  . Cancer Unknown   . Cancer Brother        GI  . Cancer Mother        liver  . Cancer Son        terminal kidney    Allergies  Allergen Reactions  . Ace Inhibitors Swelling and Cough    Pt had cough and diarrhea with first few doses of medication; also had swelling of right eyelid. Stopped medication on 03/17/11.  . Alendronate Sodium Other (See Comments)    Whole body hurt  . Aspirin Hives  . Lipitor [Atorvastatin Calcium] Other (See Comments)    Whole body hurt  . Metformin And Related     Severe abdominal pain  . Shellfish Allergy     Intolerant of fresh shellfish, reports the reaction is GI upset, denies hives, denies any swelling  Reports that she can tolerate canned seafood.     Medication list has been reviewed and updated.  Current  Outpatient Medications on File Prior to Visit  Medication Sig Dispense Refill  . acetaminophen (TYLENOL) 325 MG tablet Take 650 mg by mouth every 6 (six) hours as needed for mild pain.    . blood glucose meter kit and supplies Dispense based on patient and insurance preference. Pt just needs meter 1 each 0  . glipiZIDE (GLUCOTROL XL) 5 MG 24 hr tablet Take 1 tablet (5 mg total) by mouth daily with breakfast. 90 tablet 3  . glucose blood test strip Test blood sugar 3 times a day. Dx code: 250.00 100 each 12  . Insulin Glargine (LANTUS SOLOSTAR) 100 UNIT/ML Solostar Pen Inject 20 Units into the skin daily at 10 pm. 15 mL 3  . Insulin Pen Needle (BD PEN NEEDLE NANO U/F) 32G X 4 MM MISC USE TO INJECT INSULIN ONCE DAILY 100 each 6  . Insulin Syringe-Needle U-100 (INSULIN SYRINGE .5CC/31GX5/16") 31G X 5/16" 0.5 ML MISC Use to inject insulin 1 time daily. 90 each 3  . niacin (NIASPAN) 500 MG CR tablet Take 1 tablet (500 mg total) by mouth at bedtime. Increase to 2 tablets after 1 month (Patient taking differently: Take 500 mg by mouth daily. ) 180 tablet 2  . vitamin E 200 UNIT capsule Take 200 Units by mouth daily. Reported on 05/31/2015    . [DISCONTINUED] lisinopril (PRINIVIL,ZESTRIL) 20 MG tablet Take 20 mg by mouth daily.      No current facility-administered medications on file prior to visit.     Review of Systems:  As per HPI- otherwise negative. She joined the [employer] and is doing water exercise   Physical Examination: Vitals:   03/01/17 1252 03/01/17 1315  BP: (!) 170/90 (!) 160/90  Pulse: 78   Temp: 98.1 F (36.7 C)   SpO2: 98%    Vitals:   03/01/17 1252  Weight: 179 lb 9.6 oz (81.5 kg)  Height: 5' 1"  (1.549 m)   Body mass index is 33.94 kg/m. Ideal Body Weight: Weight in (lb) to have BMI = 25: 132  GEN: WDWN, NAD, Non-toxic, A & O x 3, obese, looks well  HEENT: Atraumatic, Normocephalic. Neck supple. No masses, No LAD.  Bilateral TM wnl, oropharynx normal.  PEERL,EOMI.    Ears and Nose: No external deformity. CV: RRR, No M/G/R. No JVD. No thrill. No extra heart sounds. PULM: CTA B, no wheezes, crackles, rhonchi. No retractions. No resp. distress. No accessory muscle use. EXTR: No c/c/e NEURO Normal gait.  PSYCH: Normally interactive. Conversant. Not  depressed or anxious appearing.  Calm demeanor.    Assessment and Plan: Uncontrolled type 2 diabetes mellitus without complication, with long-term current use of insulin ([place]) - Plan: Microalbumin / creatinine urine ratio, Hemoglobin A1c, Comprehensive metabolic panel  Dyslipidemia  Essential hypertension - Plan: amLODipine (NORVASC) 5 MG tablet  Her BP is up again today- will start on medication for same We have had some difficulty finding a BP med that she can tolerate- she is not able to use Ace-I due to swelling.  Will try amlodipine for her She will monitor her BP and let me know how she responds Check A1c, other labs for her today She is on niaspan for her CHL- did not tolerate statins   Signed Lamar Blinks, MD   Received labs, message to pt  Results for orders placed or performed in visit on 03/01/17  Microalbumin / creatinine urine ratio  Result Value Ref Range   Microalb, Ur <0.7 0.0 - 1.9 mg/dL   Creatinine,U 24.4 mg/dL   Microalb Creat Ratio 2.9 0.0 - 30.0 mg/g  Hemoglobin A1c  Result Value Ref Range   Hgb A1c MFr Bld 8.2 (H) 4.6 - 6.5 %  Comprehensive metabolic panel  Result Value Ref Range   Sodium 139 135 - 145 mEq/L   Potassium 4.3 3.5 - 5.1 mEq/L   Chloride 106 96 - 112 mEq/L   CO2 28 19 - 32 mEq/L   Glucose, Bld 144 (H) 70 - 99 mg/dL   BUN 14 6 - 23 mg/dL   Creatinine, Ser 0.95 0.40 - 1.20 mg/dL   Total Bilirubin 0.5 0.2 - 1.2 mg/dL   Alkaline Phosphatase 46 39 - 117 U/L   AST 14 0 - 37 U/L   ALT 17 0 - 35 U/L   Total Protein 7.2 6.0 - 8.3 g/dL   Albumin 4.1 3.5 - 5.2 g/dL   Calcium 10.1 8.4 - 10.5 mg/dL   GFR 60.61 >60.00 mL/min   Urine does NOT show any abnormal  protein- good news Your A1c has gone up a bit.  Please work on getting your diet back to normal, and let's recheck in 4 months.   Would like to see your A1c under 8% again. Metabolic profile is ok  pleases let me know how you do on the amlodipine for your BP  It also looks like you are due for a mammogram- let me know if you need any help in setting this up

## 2017-02-28 NOTE — Progress Notes (Addendum)
Subjective:   Wanda [name] is a 77 y.o. female who presents for Medicare Annual (Subsequent) preventive examination.  Review of Systems:  No ROS.  Medicare Wellness Visit. Additional risk factors are reflected in the social history. Cardiac Risk Factors include: advanced age (>43mn, >>33women);diabetes mellitus;dyslipidemia;hypertension  Sleep patterns: Sleeps 8 hrs. Feels rested. Home Safety/Smoke Alarms: Feels safe in home. Smoke alarms in place.  Living environment; residence and Firearm Safety: No stairs. Lives alone.  Seat Belt Safety/Bike Helmet: Wears seat belt.   Female:   Pap-  No longer doing routine screening due to age.     Mammo-  Last 07/16/15-  BI-RADS Category 2: benign   Dexa scan-  Last 08/20/15-osteoporosis      CCS- cologuard 05/18/15-negative    Objective:     Vitals: BP (!) 170/90 Comment: done with PCP appt at 1252 today  Pulse 78 Comment: 1252 PCP appt  Ht _0  (1.549 m)   Wt 179 lb (81.2 kg)   SpO2 98% Comment: Done w/ PCP appt _1   BMI 33.82 kg/m   Body mass index is 33.82 kg/m. **PCP aware of BP-prescribed amlodipine today.   Advanced Directives 03/01/2017 02/10/2016 01/20/2016 10/14/2015 10/04/2015 09/24/2015  Does Patient Have a Medical Advance Directive? No No Yes No No No  Would patient like information on creating a medical advance directive? Yes (MAU/Ambulatory/Procedural Areas - Information given) No - Patient declined - No - patient declined information No - patient declined information Yes -Higher education careers advisergiven    Tobacco Social History   Tobacco Use  Smoking Status Never Smoker  Smokeless Tobacco Never Used     Counseling given: Not Answered   Clinical Intake: Pain : No/denies pain   Past Medical History:  Diagnosis Date  . Arthritis   . Cataract   . Diabetes mellitus   . Hypertension   . Skin cancer    Removed from face  . Urgency of urination   . Urinary leakage    Past Surgical History:  Procedure Laterality  Date  . ABDOMINAL HYSTERECTOMY  early 80's   total  . CARPAL TUNNEL RELEASE Bilateral 1980 and 1981   both hands  . EYE SURGERY     bilateral cataracts  . Fatty Tumor Excision    . JOINT REPLACEMENT    . TONSILLECTOMY    . TONSILLECTOMY AND ADENOIDECTOMY  age 77 . TOTAL KNEE ARTHROPLASTY Right 10/04/2015  . TOTAL KNEE ARTHROPLASTY Right 10/04/2015   Procedure: TOTAL KNEE ARTHROPLASTY;  Surgeon: JDorna Leitz MD;  Location[place]  Service: Orthopedics;  Laterality: Right;  . TOTAL KNEE ARTHROPLASTY Left 01/28/2016   Procedure: TOTAL KNEE ARTHROPLASTY;  Surgeon: JDorna Leitz MD;  [place]  Service: Orthopedics;  Laterality: Left;  . TUBAL LIGATION     Family History  Problem Relation Age of Onset  . Pancreatitis Father        deceased 364 . Cancer Unknown   . Cancer Brother        GI  . Cancer Mother        liver  . Cancer Son        terminal kidney   Social History   Socioeconomic History  . Marital status: Widowed    Spouse name: Not on file  . Number of children: Not on file  . Years of education: Not on file  . Highest education level: Not on file  Social Needs  . Financial resource strain: Not on file  .  Food insecurity - worry: Not on file  . Food insecurity - inability: Not on file  . Transportation needs - medical: Not on file  . Transportation needs - non-medical: Not on file  Occupational History  . Not on file  Tobacco Use  . Smoking status: Never Smoker  . Smokeless tobacco: Never Used  Substance and Sexual Activity  . Alcohol use: Yes    Alcohol/week: 0.5 oz    Types: 1 Standard drinks or equivalent per week  . Drug use: No  . Sexual activity: No  Other Topics Concern  . Not on file  Social History Narrative   Widowed. Education: [employer]. Exercise: 2 times a week for 30 minutes.    Outpatient Encounter Medications as of 03/01/2017  Medication Sig  . acetaminophen (TYLENOL) 325 MG tablet Take 650 mg by mouth every 6 (six) hours as needed  for mild pain.  . blood glucose meter kit and supplies Dispense based on patient and insurance preference. Pt just needs meter  . glipiZIDE (GLUCOTROL XL) 5 MG 24 hr tablet Take 1 tablet (5 mg total) by mouth daily with breakfast.  . glucose blood test strip Test blood sugar 3 times a day. Dx code: 250.00  . Insulin Glargine (LANTUS SOLOSTAR) 100 UNIT/ML Solostar Pen Inject 20 Units into the skin daily at 10 pm.  . Insulin Pen Needle (BD PEN NEEDLE NANO U/F) 32G X 4 MM MISC USE TO INJECT INSULIN ONCE DAILY  . Insulin Syringe-Needle U-100 (INSULIN SYRINGE .5CC/31GX5/16") 31G X 5/16" 0.5 ML MISC Use to inject insulin 1 time daily.  . niacin (NIASPAN) 500 MG CR tablet Take 1 tablet (500 mg total) by mouth at bedtime. Increase to 2 tablets after 1 month (Patient taking differently: Take 500 mg by mouth daily. )  . vitamin E 200 UNIT capsule Take 200 Units by mouth daily. Reported on 05/31/2015  . [DISCONTINUED] Blood Glucose Monitoring Suppl (ONE TOUCH ULTRA MINI) w/Device KIT Use to test blood sugar daily as instructed. Dx: E11.65  . [DISCONTINUED] lisinopril (PRINIVIL,ZESTRIL) 20 MG tablet Take 20 mg by mouth daily.   . [DISCONTINUED] Lorcaserin HCl ER (BELVIQ XR) 20 MG TB24 Take 1 tablet by mouth daily. (Patient not taking: Reported on 03/01/2017)  . [DISCONTINUED] tiZANidine (ZANAFLEX) 2 MG tablet Take 1 tablet (2 mg total) by mouth every 8 (eight) hours as needed for muscle spasms. (Patient not taking: Reported on 03/01/2017)   No facility-administered encounter medications on file as of 03/01/2017.     Activities of Daily Living In your present state of health, do you have any difficulty performing the following activities: 03/01/2017  Hearing? N  Vision? N  Comment wears glasses. Eye doctor yearly for diabetic eye exams.  Difficulty concentrating or making decisions? N  Walking or climbing stairs? N  Dressing or bathing? N  Doing errands, shopping? N  Preparing Food and eating ? N  Using the  Toilet? N  In the past six months, have you accidently leaked urine? Y  Comment wears pads.  Do you have problems with loss of bowel control? N  Managing your Medications? N  Managing your Finances? N  Housekeeping or managing your Housekeeping? N  Some recent data might be hidden    Patient Care Team: Copland, Gay Filler, MD as PCP - General (Family Medicine)    Assessment:   This is a routine wellness examination for Wanda [name]. Physical assessment deferred to PCP.  Exercise Activities and Dietary recommendations Current Exercise Habits: Structured exercise  class, Intensity: Mild   Diet (meal preparation, eat out, water intake, caffeinated beverages, dairy products, fruits and vegetables): in general, a "healthy" diet  , well balanced, on average, 3 meals per day    Goals    . Travel more     Look into the [facility]       Fall Risk Fall Risk  03/01/2017 12/16/2015 10/07/2014 09/26/2013 06/12/2013  Falls in the past year? _0   Comment - Emmi Telephone Survey: data to providers prior to load - - -    Depression Screen PHQ 2/9 Scores 03/01/2017 10/18/2015 10/07/2014 09/26/2013  PHQ - 2 Score 0 0 0 0     Cognitive Function MMSE - Mini Mental State Exam 03/01/2017  Orientation to time 5  Orientation to Place 5  Registration 3  Attention/ Calculation 5  Recall 3  Language- name 2 objects 2  Language- repeat 1  Language- follow 3 step command 3  Language- read & follow direction 1  Write a sentence 1  Copy design 1  Total score 30        There is no immunization history for the selected administration types on file for this patient.  Screening Tests Health Maintenance  Topic Date Due  . TETANUS/TDAP  01/09/2014  . MAMMOGRAM  07/15/2016  . URINE MICROALBUMIN  08/15/2016  . PNA vac Low Risk Adult (1 of 2 - PCV13) 08/31/2017 (Originally 01/11/2005)  . INFLUENZA VACCINE  04/09/2018 (Originally 08/09/2016)  . HEMOGLOBIN A1C  03/03/2017  . OPHTHALMOLOGY EXAM   04/09/2017  . FOOT EXAM  08/31/2017  . DEXA SCAN  Completed      Plan:   Follow up with PCP as directed.  Continue to eat heart healthy diet (full of fruits, vegetables, whole grains, lean protein, water--limit salt, fat, and sugar intake) and increase physical activity as tolerated.  Continue doing brain stimulating activities (puzzles, reading, adult coloring books, staying active) to keep memory sharp.   Bring a copy of your living will and/or healthcare power of attorney to your next office visit.   I have personally reviewed and noted the following in the patient's chart:   . Medical and social history . Use of alcohol, tobacco or illicit drugs  . Current medications and supplements . Functional ability and status . Nutritional status . Physical activity . Advanced directives . List of other physicians . Hospitalizations, surgeries, and ER visits in previous 12 months . Vitals . Screenings to include cognitive, depression, and falls . Referrals and appointments  In addition, I have reviewed and discussed with patient certain preventive protocols, quality metrics, and best practice recommendations. A written personalized care plan for preventive services as well as general preventive health recommendations were provided to patient.     Wanda [name], Wanda [name]  03/01/2017   I have reviewed the above MWE by [name] and agree with her documentation Wanda Peon MD

## 2017-03-01 ENCOUNTER — Ambulatory Visit (INDEPENDENT_AMBULATORY_CARE_PROVIDER_SITE_OTHER): Payer: Medicare Other | Admitting: *Deleted

## 2017-03-01 ENCOUNTER — Encounter: Payer: Self-pay | Admitting: Family Medicine

## 2017-03-01 ENCOUNTER — Ambulatory Visit (INDEPENDENT_AMBULATORY_CARE_PROVIDER_SITE_OTHER): Payer: Medicare Other | Admitting: Family Medicine

## 2017-03-01 ENCOUNTER — Encounter: Payer: Self-pay | Admitting: *Deleted

## 2017-03-01 VITALS — BP 170/90 | HR 78 | Ht 61.0 in | Wt 179.0 lb

## 2017-03-01 VITALS — BP 160/90 | HR 78 | Temp 98.1°F | Ht 61.0 in | Wt 179.6 lb

## 2017-03-01 DIAGNOSIS — E785 Hyperlipidemia, unspecified: Secondary | ICD-10-CM | POA: Diagnosis not present

## 2017-03-01 DIAGNOSIS — I10 Essential (primary) hypertension: Secondary | ICD-10-CM

## 2017-03-01 DIAGNOSIS — Z794 Long term (current) use of insulin: Secondary | ICD-10-CM

## 2017-03-01 DIAGNOSIS — Z0000 Encounter for general adult medical examination without abnormal findings: Secondary | ICD-10-CM

## 2017-03-01 DIAGNOSIS — E1165 Type 2 diabetes mellitus with hyperglycemia: Secondary | ICD-10-CM

## 2017-03-01 DIAGNOSIS — IMO0001 Reserved for inherently not codable concepts without codable children: Secondary | ICD-10-CM

## 2017-03-01 LAB — COMPREHENSIVE METABOLIC PANEL
ALT: 17 U/L (ref 0–35)
AST: 14 U/L (ref 0–37)
Albumin: 4.1 g/dL (ref 3.5–5.2)
Alkaline Phosphatase: 46 U/L (ref 39–117)
BUN: 14 mg/dL (ref 6–23)
CHLORIDE: 106 meq/L (ref 96–112)
CO2: 28 meq/L (ref 19–32)
Calcium: 10.1 mg/dL (ref 8.4–10.5)
Creatinine, Ser: 0.95 mg/dL (ref 0.40–1.20)
GFR: 60.61 mL/min (ref >60.00)
GLUCOSE: 144 mg/dL — AB (ref 70–99)
POTASSIUM: 4.3 meq/L (ref 3.5–5.1)
SODIUM: 139 meq/L (ref 135–145)
TOTAL PROTEIN: 7.2 g/dL (ref 6.0–8.3)
Total Bilirubin: 0.5 mg/dL (ref 0.2–1.2)

## 2017-03-01 LAB — HEMOGLOBIN A1C: HEMOGLOBIN A1C: 8.2 % — AB (ref 4.6–6.5)

## 2017-03-01 LAB — MICROALBUMIN / CREATININE URINE RATIO
Creatinine,U: 24.4 mg/dL
MICROALB/CREAT RATIO: 2.9 mg/g (ref 0.0–30.0)

## 2017-03-01 MED ORDER — AMLODIPINE BESYLATE 5 MG PO TABS: 5.0000 mg | ORAL_TABLET | Freq: Every day | ORAL | 6 refills | Status: DC

## 2017-03-01 MED ORDER — ONETOUCH ULTRA MINI W/DEVICE KIT: PACK | 0 refills | Status: DC

## 2017-03-01 NOTE — Patient Instructions (Signed)
Follow up with PCP as directed.  Continue to eat heart healthy diet (full of fruits, vegetables, whole grains, lean protein, water--limit salt, fat, and sugar intake) and increase physical activity as tolerated.  Continue doing brain stimulating activities (puzzles, reading, adult coloring books, staying active) to keep memory sharp.   Bring a copy of your living will and/or healthcare power of attorney to your next office visit.   Wanda [name] , Thank you for taking time to come for your Medicare Wellness Visit. I appreciate your ongoing commitment to your health goals. Please review the following plan we discussed and let me know if I can assist you in the future.   These are the goals we discussed: Goals    . Travel more     [facility] into the [facility]       This is a list of the screening recommended for you and due dates:  Health Maintenance  Topic Date Due  . Tetanus Vaccine  01/09/2014  . Mammogram  07/15/2016  . Urine Protein Check  08/15/2016  . Pneumonia vaccines (1 of 2 - PCV13) 08/31/2017*  . Flu Shot  04/09/2018*  . Hemoglobin A1C  03/03/2017  . Eye exam for diabetics  04/09/2017  . Complete foot exam   08/31/2017  . DEXA scan (bone density measurement)  Completed  *Topic was postponed. The date shown is not the original due date.    Health Maintenance for Postmenopausal Women Menopause is a normal process in which your reproductive ability comes to an end. This process happens gradually over a span of months to years, usually between the ages of 52 and 36. Menopause is complete when you have missed 12 consecutive menstrual periods. It is important to talk with your health care provider about some of the most common conditions that affect postmenopausal women, such as heart disease, cancer, and bone loss (osteoporosis). Adopting a healthy lifestyle and getting preventive care can help to promote your health and wellness. Those actions can also lower your chances of  developing some of these common conditions. What should I know about menopause? During menopause, you may experience a number of symptoms, such as:  Moderate-to-severe hot flashes.  Night sweats.  Decrease in sex drive.  Mood swings.  Headaches.  Tiredness.  Irritability.  Memory problems.  Insomnia.  Choosing to treat or not to treat menopausal changes is an individual decision that you make with your health care provider. What should I know about hormone replacement therapy and supplements? Hormone therapy products are effective for treating symptoms that are associated with menopause, such as hot flashes and night sweats. Hormone replacement carries certain risks, especially as you become older. If you are thinking about using estrogen or estrogen with progestin treatments, discuss the benefits and risks with your health care provider. What should I know about heart disease and stroke? Heart disease, heart attack, and stroke become more likely as you age. This may be due, in part, to the hormonal changes that your body experiences during menopause. These can affect how your body processes dietary fats, triglycerides, and cholesterol. Heart attack and stroke are both medical emergencies. There are many things that you can do to help prevent heart disease and stroke:  Have your blood pressure checked at least every 1-2 years. High blood pressure causes heart disease and increases the risk of stroke.  If you are 77-54 years old, ask your health care provider if you should take aspirin to prevent a heart attack or  a stroke.  Do not use any tobacco products, including cigarettes, chewing tobacco, or electronic cigarettes. If you need help quitting, ask your health care provider.  It is important to eat a healthy diet and maintain a healthy weight. ? Be sure to include plenty of vegetables, fruits, low-fat dairy products, and lean protein. ? Avoid eating foods that are high in solid  fats, added sugars, or salt (sodium).  Get regular exercise. This is one of the most important things that you can do for your health. ? Try to exercise for at least 150 minutes each week. The type of exercise that you do should increase your heart rate and make you sweat. This is known as moderate-intensity exercise. ? Try to do strengthening exercises at least twice each week. Do these in addition to the moderate-intensity exercise.  Know your numbers.Ask your health care provider to check your cholesterol and your blood glucose. Continue to have your blood tested as directed by your health care provider.  What should I know about cancer screening? There are several types of cancer. Take the following steps to reduce your risk and to catch any cancer development as early as possible. Breast Cancer  Practice breast self-awareness. ? This means understanding how your breasts normally appear and feel. ? It also means doing regular breast self-exams. Let your health care provider know about any changes, no matter how small.  If you are 84 or older, have a clinician do a breast exam (clinical breast exam or CBE) every year. Depending on your age, family history, and medical history, it may be recommended that you also have a yearly breast X-ray (mammogram).  If you have a family history of breast cancer, talk with your health care provider about genetic screening.  If you are at high risk for breast cancer, talk with your health care provider about having an MRI and a mammogram every year.  Breast cancer (BRCA) gene test is recommended for women who have family members with BRCA-related cancers. Results of the assessment will determine the need for genetic counseling and BRCA1 and for BRCA2 testing. BRCA-related cancers include these types: ? Breast. This occurs in males or females. ? Ovarian. ? Tubal. This may also be called fallopian tube cancer. ? Cancer of the abdominal or pelvic lining  (peritoneal cancer). ? Prostate. ? Pancreatic.  Cervical, Uterine, and Ovarian Cancer Your health care provider may recommend that you be screened regularly for cancer of the pelvic organs. These include your ovaries, uterus, and vagina. This screening involves a pelvic exam, which includes checking for microscopic changes to the surface of your cervix (Pap test).  For women ages 21-65, health care providers may recommend a pelvic exam and a Pap test every three years. For women ages 28-65, they may recommend the Pap test and pelvic exam, combined with testing for human papilloma virus (HPV), every five years. Some types of HPV increase your risk of cervical cancer. Testing for HPV may also be done on women of any age who have unclear Pap test results.  Other health care providers may not recommend any screening for nonpregnant women who are considered low risk for pelvic cancer and have no symptoms. Ask your health care provider if a screening pelvic exam is right for you.  If you have had past treatment for cervical cancer or a condition that could lead to cancer, you need Pap tests and screening for cancer for at least 20 years after your treatment. If Pap tests  have been discontinued for you, your risk factors (such as having a new sexual partner) need to be reassessed to determine if you should start having screenings again. Some women have medical problems that increase the chance of getting cervical cancer. In these cases, your health care provider may recommend that you have screening and Pap tests more often.  If you have a family history of uterine cancer or ovarian cancer, talk with your health care provider about genetic screening.  If you have vaginal bleeding after reaching menopause, tell your health care provider.  There are currently no reliable tests available to screen for ovarian cancer.  Lung Cancer Lung cancer screening is recommended for adults 53-19 years old who are at  high risk for lung cancer because of a history of smoking. A yearly low-dose CT scan of the lungs is recommended if you:  Currently smoke.  Have a history of at least 30 pack-years of smoking and you currently smoke or have quit within the past 15 years. A pack-year is smoking an average of one pack of cigarettes per day for one year.  Yearly screening should:  Continue until it has been 15 years since you quit.  Stop if you develop a health problem that would prevent you from having lung cancer treatment.  Colorectal Cancer  This type of cancer can be detected and can often be prevented.  Routine colorectal cancer screening usually begins at age 66 and continues through age 9.  If you have risk factors for colon cancer, your health care provider may recommend that you be screened at an earlier age.  If you have a family history of colorectal cancer, talk with your health care provider about genetic screening.  Your health care provider may also recommend using home test kits to check for hidden blood in your stool.  A small camera at the end of a tube can be used to examine your colon directly (sigmoidoscopy or colonoscopy). This is done to check for the earliest forms of colorectal cancer.  Direct examination of the colon should be repeated every 5-10 years until age 5. However, if early forms of precancerous polyps or small growths are found or if you have a family history or genetic risk for colorectal cancer, you may need to be screened more often.  Skin Cancer  Check your skin from head to toe regularly.  Monitor any moles. Be sure to tell your health care provider: ? About any new moles or changes in moles, especially if there is a change in a mole's shape or color. ? If you have a mole that is larger than the size of a pencil eraser.  If any of your family members has a history of skin cancer, especially at a young age, talk with your health care provider about genetic  screening.  Always use sunscreen. Apply sunscreen liberally and repeatedly throughout the day.  Whenever you are outside, protect yourself by wearing long sleeves, pants, a wide-brimmed hat, and sunglasses.  What should I know about osteoporosis? Osteoporosis is a condition in which bone destruction happens more quickly than new bone creation. After menopause, you may be at an increased risk for osteoporosis. To help prevent osteoporosis or the bone fractures that can happen because of osteoporosis, the following is recommended:  If you are 42-44 years old, get at least 1,000 mg of calcium and at least 600 mg of vitamin D per day.  If you are older than age 62 but younger than  age 64, get at least 1,200 mg of calcium and at least 600 mg of vitamin D per day.  If you are older than age 22, get at least 1,200 mg of calcium and at least 800 mg of vitamin D per day.  Smoking and excessive alcohol intake increase the risk of osteoporosis. Eat foods that are rich in calcium and vitamin D, and do weight-bearing exercises several times each week as directed by your health care provider. What should I know about how menopause affects my mental health? Depression may occur at any age, but it is more common as you become older. Common symptoms of depression include:  Low or sad mood.  Changes in sleep patterns.  Changes in appetite or eating patterns.  Feeling an overall lack of motivation or enjoyment of activities that you previously enjoyed.  Frequent crying spells.  Talk with your health care provider if you think that you are experiencing depression. What should I know about immunizations? It is important that you get and maintain your immunizations. These include:  Tetanus, diphtheria, and pertussis (Tdap) booster vaccine.  Influenza every year before the flu season begins.  Pneumonia vaccine.  Shingles vaccine.  Your health care provider may also recommend other  immunizations. This information is not intended to replace advice given to you by your health care provider. Make sure you discuss any questions you have with your health care provider. Document Released: 02/17/2005 Document Revised: 07/16/2015 Document Reviewed: 09/29/2014 Elsevier Interactive Patient Education  2018 [employer].

## 2017-03-01 NOTE — Patient Instructions (Addendum)
I will be in touch with your labs- please have a blood draw after you see Glenard Haring today for your MWE Let's try amlodipine 5mg  once a day for your blood pressure.  Please check your BP at home and let me know how you respond- goal is 120- 130/80- 90   We will plan our next visit depending on your labs, but likely will be 4-6 months

## 2017-03-21 ENCOUNTER — Encounter: Payer: Self-pay | Admitting: Family Medicine

## 2017-03-21 DIAGNOSIS — I10 Essential (primary) hypertension: Secondary | ICD-10-CM

## 2017-03-21 MED ORDER — AMLODIPINE BESYLATE 10 MG PO TABS: 10.0000 mg | ORAL_TABLET | Freq: Every day | ORAL | 6 refills | Status: DC

## 2017-04-13 ENCOUNTER — Other Ambulatory Visit: Payer: Self-pay | Admitting: Emergency Medicine

## 2017-04-13 ENCOUNTER — Encounter: Payer: Self-pay | Admitting: Family Medicine

## 2017-04-13 DIAGNOSIS — R2243 Localized swelling, mass and lump, lower limb, bilateral: Secondary | ICD-10-CM

## 2017-04-13 DIAGNOSIS — I10 Essential (primary) hypertension: Secondary | ICD-10-CM

## 2017-04-13 MED ORDER — GLUCOSE BLOOD VI STRP: ORAL_STRIP | 12 refills | Status: DC

## 2017-04-13 MED ORDER — HYDROCHLOROTHIAZIDE 25 MG PO TABS: 25.0000 mg | ORAL_TABLET | Freq: Every day | ORAL | 3 refills | Status: DC

## 2017-04-13 NOTE — Addendum Note (Signed)
Addended by: Lamar Blinks C on: 04/13/2017 04:26 PM   Modules accepted: Orders

## 2017-04-16 ENCOUNTER — Ambulatory Visit
Admission: RE | Admit: 2017-04-16 | Discharge: 2017-04-16 | Disposition: A | Discharge status: Home / Self Care | Payer: Medicare Other | Source: Ambulatory Visit | Attending: Family Medicine | Admitting: Family Medicine

## 2017-04-16 ENCOUNTER — Other Ambulatory Visit: Payer: Self-pay | Admitting: Family Medicine

## 2017-04-16 ENCOUNTER — Encounter: Payer: Self-pay | Admitting: Family Medicine

## 2017-04-16 DIAGNOSIS — R2243 Localized swelling, mass and lump, lower limb, bilateral: Secondary | ICD-10-CM

## 2017-04-16 DIAGNOSIS — R600 Localized edema: Secondary | ICD-10-CM | POA: Diagnosis not present

## 2017-05-01 DIAGNOSIS — E113293 Type 2 diabetes mellitus with mild nonproliferative diabetic retinopathy without macular edema, bilateral: Secondary | ICD-10-CM | POA: Diagnosis not present

## 2017-05-01 DIAGNOSIS — Z961 Presence of intraocular lens: Secondary | ICD-10-CM | POA: Diagnosis not present

## 2017-05-01 DIAGNOSIS — D3132 Benign neoplasm of left choroid: Secondary | ICD-10-CM | POA: Diagnosis not present

## 2017-05-01 DIAGNOSIS — H26493 Other secondary cataract, bilateral: Secondary | ICD-10-CM | POA: Diagnosis not present

## 2017-05-18 DIAGNOSIS — H40013 Open angle with borderline findings, low risk, bilateral: Secondary | ICD-10-CM | POA: Diagnosis not present

## 2017-05-18 DIAGNOSIS — Z961 Presence of intraocular lens: Secondary | ICD-10-CM | POA: Diagnosis not present

## 2017-05-18 DIAGNOSIS — H26493 Other secondary cataract, bilateral: Secondary | ICD-10-CM | POA: Diagnosis not present

## 2017-05-18 DIAGNOSIS — H26492 Other secondary cataract, left eye: Secondary | ICD-10-CM | POA: Diagnosis not present

## 2017-05-21 ENCOUNTER — Encounter: Payer: Self-pay | Admitting: Family Medicine

## 2017-05-21 MED ORDER — INSULIN GLARGINE 100 UNIT/ML SOLOSTAR PEN: 20.0000 [IU] | PEN_INJECTOR | Freq: Every day | SUBCUTANEOUS | 3 refills | Status: DC

## 2017-06-05 DIAGNOSIS — Z961 Presence of intraocular lens: Secondary | ICD-10-CM | POA: Diagnosis not present

## 2017-06-05 DIAGNOSIS — H26491 Other secondary cataract, right eye: Secondary | ICD-10-CM | POA: Diagnosis not present

## 2017-08-20 ENCOUNTER — Encounter: Payer: Self-pay | Admitting: Family Medicine

## 2017-08-20 DIAGNOSIS — E1165 Type 2 diabetes mellitus with hyperglycemia: Principal | ICD-10-CM

## 2017-08-20 DIAGNOSIS — Z794 Long term (current) use of insulin: Principal | ICD-10-CM

## 2017-08-20 DIAGNOSIS — IMO0001 Reserved for inherently not codable concepts without codable children: Secondary | ICD-10-CM

## 2017-08-20 MED ORDER — GLIPIZIDE ER 5 MG PO TB24: 5.0000 mg | ORAL_TABLET | Freq: Every day | ORAL | 1 refills | Status: DC

## 2017-09-05 DIAGNOSIS — Z1231 Encounter for screening mammogram for malignant neoplasm of breast: Secondary | ICD-10-CM | POA: Diagnosis not present

## 2017-09-05 DIAGNOSIS — Z803 Family history of malignant neoplasm of breast: Secondary | ICD-10-CM | POA: Diagnosis not present

## 2017-09-05 NOTE — Progress Notes (Deleted)
[place] at [facility] [address], [place], [place] [address] [phone]  Date:  09/06/2017   Name:  Wanda [name]   DOB:  [birth year]   MRN:  [id number]  PCP:  Darreld Mclean, MD    Chief Complaint: No chief complaint on file.   History of Present Illness:  Wanda [name] is a 77 y.o. very pleasant female patient who presents with the following:  Here today to follow-up on her diabetes History of DM, HTN, hyperlipidemia, obesity, OA Last seen here in February:  Her BP is up again today- will start on medication for same We have had some difficulty finding a BP med that she can tolerate- she is not able to use Ace-I due to swelling.  Will try amlodipine for her She will monitor her BP and let me know how she responds Check A1c, other labs for her today She is on niaspan for her CHL- did not tolerate statins /////////////////// Urine does NOT show any abnormal protein- good news  Your A1c has gone up a bit. Please work on getting your diet back to normal, and let's recheck in 4 months.   Would like to see your A1c under 8% again.  Metabolic profile is ok  Please let me know how you do on the amlodipine for your BP  It also looks like you are due for a mammogram- let me know if you need any help in setting this up   Needs A1c today Eye exam: Mammo: Pneumonia vaccine:   Lab Results  Component Value Date   HGBA1C 8.2 (H) 03/01/2017     Patient Active Problem List   Diagnosis Date Noted  . Primary osteoarthritis of left knee 10/04/2015  . Osteoporosis 08/23/2015  . Multinodular goiter (nontoxic) 04/07/2014  . Diabetes mellitus type 2, uncontrolled, without complications ([facility]) 01/11/7251  . Insulin adverse reaction 09/27/2013  . Senile nuclear sclerosis 08/14/2012  . HTN, goal below 130/80 03/10/2011  . Hyperlipidemia with target LDL less than 70 03/10/2011  . Obesity (BMI 30.0-34.9) 03/10/2011  . Health care  maintenance 03/10/2011    Past Medical History:  Diagnosis Date  . Arthritis   . Cataract   . Diabetes mellitus   . Hypertension   . Skin cancer    Removed from face  . Urgency of urination   . Urinary leakage     Past Surgical History:  Procedure Laterality Date  . ABDOMINAL HYSTERECTOMY  early 80's   total  . CARPAL TUNNEL RELEASE Bilateral 1980 and 1981   both hands  . EYE SURGERY     bilateral cataracts  . Fatty Tumor Excision    . JOINT REPLACEMENT    . TONSILLECTOMY    . TONSILLECTOMY AND ADENOIDECTOMY  age 80  . TOTAL KNEE ARTHROPLASTY Right 10/04/2015  . TOTAL KNEE ARTHROPLASTY Right 10/04/2015   Procedure: TOTAL KNEE ARTHROPLASTY;  Surgeon: Dorna Leitz, MD;  Location: [facility];  Service: Orthopedics;  Laterality: Right;  . TOTAL KNEE ARTHROPLASTY Left 01/28/2016   Procedure: TOTAL KNEE ARTHROPLASTY;  Surgeon: Dorna Leitz, MD;  Location: [facility];  Service: Orthopedics;  Laterality: Left;  . TUBAL LIGATION      Social History   Tobacco Use  . Smoking status: Never Smoker  . Smokeless tobacco: Never Used  Substance Use Topics  . Alcohol use: Yes    Alcohol/week: 1.0 standard drinks    Types: 1 Standard drinks or equivalent per week  .  Drug use: No    Family History  Problem Relation Age of Onset  . Pancreatitis Father        deceased 27  . Cancer Unknown   . Cancer Brother        GI  . Cancer Mother        liver  . Cancer Son        terminal kidney    Allergies  Allergen Reactions  . Ace Inhibitors Swelling and Cough    Pt had cough and diarrhea with first few doses of medication; also had swelling of right eyelid. Stopped medication on 03/17/11.  . Alendronate Sodium Other (See Comments)    Whole body hurt  . Aspirin Hives  . Lipitor [Atorvastatin Calcium] Other (See Comments)    Whole body hurt  . Metformin And Related     Severe abdominal pain  . Shellfish Allergy     Intolerant of fresh shellfish, reports the reaction is GI upset, denies hives,  denies any swelling  Reports that she can tolerate canned seafood.     Medication list has been reviewed and updated.  Current Outpatient Medications on File Prior to Visit  Medication Sig Dispense Refill  . acetaminophen (TYLENOL) 325 MG tablet Take 650 mg by mouth every 6 (six) hours as needed for mild pain.    Marland Kitchen amLODipine (NORVASC) 10 MG tablet Take 1 tablet (10 mg total) by mouth daily. 30 tablet 6  . blood glucose meter kit and supplies Dispense based on patient and insurance preference. Pt just needs meter 1 each 0  . Blood Glucose Monitoring Suppl (ONE TOUCH ULTRA MINI) w/Device KIT Use to test blood sugar daily as instructed. Dx: E11.65 1 each 0  . glipiZIDE (GLUCOTROL XL) 5 MG 24 hr tablet Take 1 tablet (5 mg total) by mouth daily with breakfast. 90 tablet 1  . glucose blood (ONE TOUCH ULTRA TEST) test strip Test blood sugar 3 times a day. Dx code: 250.00 100 each 12  . glucose blood test strip Test blood sugar 3 times a day. Dx code: 250.00 100 each 12  . hydrochlorothiazide (HYDRODIURIL) 25 MG tablet Take 1 tablet (25 mg total) by mouth daily. 90 tablet 3  . Insulin Glargine (LANTUS SOLOSTAR) 100 UNIT/ML Solostar Pen Inject 20 Units into the skin daily at 10 pm. 15 mL 3  . Insulin Pen Needle (BD PEN NEEDLE NANO U/F) 32G X 4 MM MISC USE TO INJECT INSULIN ONCE DAILY 100 each 6  . Insulin Syringe-Needle U-100 (INSULIN SYRINGE .5CC/31GX5/16") 31G X 5/16" 0.5 ML MISC Use to inject insulin 1 time daily. 90 each 3  . niacin (NIASPAN) 500 MG CR tablet Take 1 tablet (500 mg total) by mouth at bedtime. Increase to 2 tablets after 1 month (Patient taking differently: Take 500 mg by mouth daily. ) 180 tablet 2  . vitamin E 200 UNIT capsule Take 200 Units by mouth daily. Reported on 05/31/2015    . [DISCONTINUED] lisinopril (PRINIVIL,ZESTRIL) 20 MG tablet Take 20 mg by mouth daily.      No current facility-administered medications on file prior to visit.     Review of Systems:  As per HPI-  otherwise negative.   Physical Examination: There were no vitals filed for this visit. There were no vitals filed for this visit. There is no height or weight on file to calculate BMI. Ideal Body Weight:    GEN: WDWN, NAD, Non-toxic, A & O x 3 HEENT: Atraumatic, Normocephalic. Neck  supple. No masses, No LAD. Ears and Nose: No external deformity. CV: RRR, No M/G/R. No JVD. No thrill. No extra heart sounds. PULM: CTA B, no wheezes, crackles, rhonchi. No retractions. No resp. distress. No accessory muscle use. ABD: S, NT, ND, +BS. No rebound. No HSM. EXTR: No c/c/e NEURO Normal gait.  PSYCH: Normally interactive. Conversant. Not depressed or anxious appearing.  Calm demeanor.    Assessment and Plan: ***  Signed Lamar Blinks, MD

## 2017-09-06 ENCOUNTER — Ambulatory Visit: Payer: Medicare Other | Admitting: Family Medicine

## 2017-09-06 DIAGNOSIS — M25562 Pain in left knee: Secondary | ICD-10-CM | POA: Diagnosis not present

## 2017-09-06 DIAGNOSIS — M25561 Pain in right knee: Secondary | ICD-10-CM | POA: Diagnosis not present

## 2017-09-13 ENCOUNTER — Ambulatory Visit (INDEPENDENT_AMBULATORY_CARE_PROVIDER_SITE_OTHER): Payer: Medicare Other | Admitting: Family Medicine

## 2017-09-13 ENCOUNTER — Encounter: Payer: Self-pay | Admitting: Family Medicine

## 2017-09-13 VITALS — BP 142/92 | HR 90 | Temp 97.7°F | Resp 16 | Ht 61.0 in | Wt 178.0 lb

## 2017-09-13 DIAGNOSIS — E1165 Type 2 diabetes mellitus with hyperglycemia: Secondary | ICD-10-CM

## 2017-09-13 DIAGNOSIS — I10 Essential (primary) hypertension: Secondary | ICD-10-CM | POA: Diagnosis not present

## 2017-09-13 DIAGNOSIS — IMO0001 Reserved for inherently not codable concepts without codable children: Secondary | ICD-10-CM

## 2017-09-13 DIAGNOSIS — Z794 Long term (current) use of insulin: Secondary | ICD-10-CM | POA: Diagnosis not present

## 2017-09-13 DIAGNOSIS — E785 Hyperlipidemia, unspecified: Secondary | ICD-10-CM | POA: Diagnosis not present

## 2017-09-13 LAB — CBC
HEMATOCRIT: 40.9 % (ref 36.0–46.0)
HEMOGLOBIN: 14 g/dL (ref 12.0–15.0)
MCHC: 34.2 g/dL (ref 30.0–36.0)
MCV: 88.8 fl (ref 78.0–100.0)
PLATELETS: 228 10*3/uL (ref 150.0–400.0)
RBC: 4.61 Mil/uL (ref 3.87–5.11)
RDW: 13.2 % (ref 11.5–15.5)
WBC: 5.6 10*3/uL (ref 4.0–10.5)

## 2017-09-13 LAB — LIPID PANEL
CHOL/HDL RATIO: 6
CHOLESTEROL: 250 mg/dL — AB (ref 0–200)
HDL: 44.8 mg/dL (ref >39.00)
LDL Cholesterol: 166 mg/dL — ABNORMAL HIGH (ref 0–99)
NonHDL: 204.96
Triglycerides: 194 mg/dL — ABNORMAL HIGH (ref 0.0–149.0)
VLDL: 38.8 mg/dL (ref 0.0–40.0)

## 2017-09-13 LAB — COMPREHENSIVE METABOLIC PANEL
ALBUMIN: 4.4 g/dL (ref 3.5–5.2)
ALK PHOS: 47 U/L (ref 39–117)
ALT: 17 U/L (ref 0–35)
AST: 16 U/L (ref 0–37)
BILIRUBIN TOTAL: 0.6 mg/dL (ref 0.2–1.2)
BUN: 23 mg/dL (ref 6–23)
CALCIUM: 10.1 mg/dL (ref 8.4–10.5)
CO2: 27 mEq/L (ref 19–32)
Chloride: 105 mEq/L (ref 96–112)
Creatinine, Ser: 0.76 mg/dL (ref 0.40–1.20)
GFR: 78.3 mL/min (ref >60.00)
Glucose, Bld: 181 mg/dL — ABNORMAL HIGH (ref 70–99)
POTASSIUM: 3.8 meq/L (ref 3.5–5.1)
Sodium: 140 mEq/L (ref 135–145)
TOTAL PROTEIN: 7.2 g/dL (ref 6.0–8.3)

## 2017-09-13 LAB — HEMOGLOBIN A1C: HEMOGLOBIN A1C: 9.2 % — AB (ref 4.6–6.5)

## 2017-09-13 MED ORDER — HYDROCHLOROTHIAZIDE 25 MG PO TABS: 25.0000 mg | ORAL_TABLET | Freq: Every day | ORAL | 3 refills | Status: DC

## 2017-09-13 MED ORDER — AMLODIPINE BESYLATE 10 MG PO TABS: 10.0000 mg | ORAL_TABLET | Freq: Every day | ORAL | 3 refills | Status: DC

## 2017-09-13 MED ORDER — GLIPIZIDE ER 10 MG PO TB24: 10.0000 mg | ORAL_TABLET | Freq: Every day | ORAL | 3 refills | Status: DC

## 2017-09-13 NOTE — Progress Notes (Addendum)
[facility] at [facility] [place], [place], [place] [address] [phone]  Date:  09/13/2017   Name:  Wanda [name]   DOB:  [birth year]   MRN:  [id number]  PCP:  Darreld Mclean, MD    Chief Complaint: Diabetes (follow up, no flu shot) and Knee Pain (bilateral knees)   History of Present Illness:  Wanda [name] is a 77 y.o. very pleasant female patient who presents with the following:  Here today for a DM recheck Last seen here in February - we started her on amlodipine at that time as her BP was high   Her BP is up again today- will start on medication for same We have had some difficulty finding a BP med that she can tolerate- she is not able to use Ace-I due to swelling.  Will try amlodipine for her She will monitor her BP and let me know how she responds Check A1c, other labs for her today She is on niaspan for her CHL- did not tolerate statins   Amlodipine 10 mg lantus - she is doing some self- adjustment.  Taking 20- 25 units  hctz 25 Glipizide 5 niaspan  She notes that her glucose has been 'really crazy"- has been up to 300 at bedtime AM may run 120- 180. She went to ortho last week- Dr. Berenice Primas, she had x-rays of her knees and was told that they looked ok She has had bilateral knee replacements In about 3 months she will follow-up with him if not better However her knee pain is not severe enough for her to want to take anything for it Her home BP is running 140s/75 She is tolerating amlodipine ok   BP Readings from Last 3 Encounters:  09/13/17 (!) 142/92  03/01/17 (!) 160/90  03/01/17 (!) 170/90   Wt Readings from Last 3 Encounters:  09/13/17 178 lb (80.7 kg)  03/01/17 179 lb 9.6 oz (81.5 kg)  03/01/17 179 lb (81.2 kg)   Lab Results  Component Value Date   HGBA1C 8.2 (H) 03/01/2017   Needs A1c Eye exam:  UTD Declines flu shot, pneumonia and tetanus vaccines  She is concerned about how previous  vaccines have reacted with her She is aware that she would need a tetanus if any significant wound  Patient Active Problem List   Diagnosis Date Noted  . Primary osteoarthritis of left knee 10/04/2015  . Osteoporosis 08/23/2015  . Multinodular goiter (nontoxic) 04/07/2014  . Diabetes mellitus type 2, uncontrolled, without complications ([facility]) 43/83/8184  . Insulin adverse reaction 09/27/2013  . Senile nuclear sclerosis 08/14/2012  . HTN, goal below 130/80 03/10/2011  . Hyperlipidemia with target LDL less than 70 03/10/2011  . Obesity (BMI 30.0-34.9) 03/10/2011  . Health care maintenance 03/10/2011    Past Medical History:  Diagnosis Date  . Arthritis   . Cataract   . Diabetes mellitus   . Hypertension   . Skin cancer    Removed from face  . Urgency of urination   . Urinary leakage     Past Surgical History:  Procedure Laterality Date  . ABDOMINAL HYSTERECTOMY  early 80's   total  . CARPAL TUNNEL RELEASE Bilateral 1980 and 1981   both hands  . EYE SURGERY     bilateral cataracts  . Fatty Tumor Excision    . JOINT REPLACEMENT    . TONSILLECTOMY    . TONSILLECTOMY AND ADENOIDECTOMY  age 89  .  TOTAL KNEE ARTHROPLASTY Right 10/04/2015  . TOTAL KNEE ARTHROPLASTY Right 10/04/2015   Procedure: TOTAL KNEE ARTHROPLASTY;  Surgeon: Dorna Leitz, MD;  Location: [facility];  Service: Orthopedics;  Laterality: Right;  . TOTAL KNEE ARTHROPLASTY Left 01/28/2016   Procedure: TOTAL KNEE ARTHROPLASTY;  Surgeon: Dorna Leitz, MD;  Location: [facility];  Service: Orthopedics;  Laterality: Left;  . TUBAL LIGATION      Social History   Tobacco Use  . Smoking status: Never Smoker  . Smokeless tobacco: Never Used  Substance Use Topics  . Alcohol use: Yes    Alcohol/week: 1.0 standard drinks    Types: 1 Standard drinks or equivalent per week  . Drug use: No    Family History  Problem Relation Age of Onset  . Pancreatitis Father        deceased 18  . Cancer Unknown   . Cancer Brother        GI   . Cancer Mother        liver  . Cancer Son        terminal kidney    Allergies  Allergen Reactions  . Ace Inhibitors Swelling and Cough    Pt had cough and diarrhea with first few doses of medication; also had swelling of right eyelid. Stopped medication on 03/17/11.  . Alendronate Sodium Other (See Comments)    Whole body hurt  . Aspirin Hives  . Lipitor [Atorvastatin Calcium] Other (See Comments)    Whole body hurt  . Metformin And Related     Severe abdominal pain  . Shellfish Allergy     Intolerant of fresh shellfish, reports the reaction is GI upset, denies hives, denies any swelling  Reports that she can tolerate canned seafood.     Medication list has been reviewed and updated.  Current Outpatient Medications on File Prior to Visit  Medication Sig Dispense Refill  . acetaminophen (TYLENOL) 325 MG tablet Take 650 mg by mouth every 6 (six) hours as needed for mild pain.    Marland Kitchen amLODipine (NORVASC) 10 MG tablet Take 1 tablet (10 mg total) by mouth daily. 30 tablet 6  . blood glucose meter kit and supplies Dispense based on patient and insurance preference. Pt just needs meter 1 each 0  . Blood Glucose Monitoring Suppl (ONE TOUCH ULTRA MINI) w/Device KIT Use to test blood sugar daily as instructed. Dx: E11.65 1 each 0  . glipiZIDE (GLUCOTROL XL) 5 MG 24 hr tablet Take 1 tablet (5 mg total) by mouth daily with breakfast. 90 tablet 1  . glucose blood (ONE TOUCH ULTRA TEST) test strip Test blood sugar 3 times a day. Dx code: 250.00 100 each 12  . glucose blood test strip Test blood sugar 3 times a day. Dx code: 250.00 100 each 12  . hydrochlorothiazide (HYDRODIURIL) 25 MG tablet Take 1 tablet (25 mg total) by mouth daily. 90 tablet 3  . Insulin Glargine (LANTUS SOLOSTAR) 100 UNIT/ML Solostar Pen Inject 20 Units into the skin daily at 10 pm. 15 mL 3  . Insulin Pen Needle (BD PEN NEEDLE NANO U/F) 32G X 4 MM MISC USE TO INJECT INSULIN ONCE DAILY 100 each 6  . Insulin Syringe-Needle  U-100 (INSULIN SYRINGE .5CC/31GX5/16") 31G X 5/16" 0.5 ML MISC Use to inject insulin 1 time daily. 90 each 3  . niacin (NIASPAN) 500 MG CR tablet Take 1 tablet (500 mg total) by mouth at bedtime. Increase to 2 tablets after 1 month (Patient taking differently: Take 500  mg by mouth daily. ) 180 tablet 2  . vitamin E 200 UNIT capsule Take 200 Units by mouth daily. Reported on 05/31/2015    . [DISCONTINUED] lisinopril (PRINIVIL,ZESTRIL) 20 MG tablet Take 20 mg by mouth daily.      No current facility-administered medications on file prior to visit.     Review of Systems:  As per HPI- otherwise negative. No fever or chills Overall feeling well No Cp or SOB    Physical Examination: Vitals:   09/13/17 1050  BP: (!) 142/92  Pulse: (!) 102  Resp: 16  Temp: 97.7 F (36.5 C)  SpO2: 98%   Vitals:   09/13/17 1050  Weight: 178 lb (80.7 kg)  Height: 5' 1"  (1.549 m)   Body mass index is 33.63 kg/m. Ideal Body Weight: Weight in (lb) to have BMI = 25: 132  GEN: WDWN, NAD, Non-toxic, A & O x 3, looks well, overweight  HEENT: Atraumatic, Normocephalic. Neck supple. No masses, No LAD. Ears and Nose: No external deformity. CV: RRR, No M/G/R. No JVD. No thrill. No extra heart sounds. PULM: CTA B, no wheezes, crackles, rhonchi. No retractions. No resp. distress. No accessory muscle use. EXTR: No c/c/e NEURO Normal gait.  PSYCH: Normally interactive. Conversant. Not depressed or anxious appearing.  Calm demeanor.  Foot exam normal today   Assessment and Plan: Dyslipidemia - Plan: Lipid panel  Essential hypertension - Plan: amLODipine (NORVASC) 10 MG tablet, hydrochlorothiazide (HYDRODIURIL) 25 MG tablet, CBC, Comprehensive metabolic panel, Hemoglobin A1c  Uncontrolled type 2 diabetes mellitus without complication, with long-term current use of insulin (HCC) - Plan: glipiZIDE (GLUCOTROL XL) 10 MG 24 hr tablet, Comprehensive metabolic panel, Hemoglobin A1c  DM recheck today A1c is pending-  however per pt report her glucose is high. Will increase her glipizide to 10 mg now and discuss further with pt pending her A1c Bp is well controlled, continue current medications  Plan next visit pending her labs   Signed Lamar Blinks, MD  Received her labs 9/6  Results for orders placed or performed in visit on 09/13/17  CBC  Result Value Ref Range   WBC 5.6 4.0 - 10.5 K/uL   RBC 4.61 3.87 - 5.11 Mil/uL   Platelets 228.0 150.0 - 400.0 K/uL   Hemoglobin 14.0 12.0 - 15.0 g/dL   HCT 40.9 36.0 - 46.0 %   MCV 88.8 78.0 - 100.0 fl   MCHC 34.2 30.0 - 36.0 g/dL   RDW 13.2 11.5 - 15.5 %  Comprehensive metabolic panel  Result Value Ref Range   Sodium 140 135 - 145 mEq/L   Potassium 3.8 3.5 - 5.1 mEq/L   Chloride 105 96 - 112 mEq/L   CO2 27 19 - 32 mEq/L   Glucose, Bld 181 (H) 70 - 99 mg/dL   BUN 23 6 - 23 mg/dL   Creatinine, Ser 0.76 0.40 - 1.20 mg/dL   Total Bilirubin 0.6 0.2 - 1.2 mg/dL   Alkaline Phosphatase 47 39 - 117 U/L   AST 16 0 - 37 U/L   ALT 17 0 - 35 U/L   Total Protein 7.2 6.0 - 8.3 g/dL   Albumin 4.4 3.5 - 5.2 g/dL   Calcium 10.1 8.4 - 10.5 mg/dL   GFR 78.30 >60.00 mL/min  Hemoglobin A1c  Result Value Ref Range   Hgb A1c MFr Bld 9.2 (H) 4.6 - 6.5 %  Lipid panel  Result Value Ref Range   Cholesterol 250 (H) 0 - 200 mg/dL  Triglycerides 194.0 (H) 0.0 - 149.0 mg/dL   HDL 44.80 >39.00 mg/dL   VLDL 38.8 0.0 - 40.0 mg/dL   LDL Cholesterol 166 (H) 0 - 99 mg/dL   Total CHOL/HDL Ratio 6    NonHDL 204.96    Your blood counts are normal Metabolic profile is fine except for elevated glucose  Your a1c has gone up- we increased your glipizide to 10 mg already.  However we may need to make further changes if we can't get you to about 8%.  Please be sure to see me in 3 months to recheck your A1c  Finally your overall cholesterol has also gotten worse.  I know that you have not tolerated lipitor in the past due to body aches. However, it may be worth trying again- you  may do better with a different statin drug, perhaps at a lower dose?  Let me know if you would be ok with trying this again.

## 2017-09-13 NOTE — Patient Instructions (Addendum)
Let's increase your glipizide to 10 mg  I will be in touch with your labs otherwise Please let me know how your blood sugar seems to respond to the change BP looks fine!

## 2017-09-14 ENCOUNTER — Encounter: Payer: Self-pay | Admitting: Family Medicine

## 2017-10-22 DIAGNOSIS — D3132 Benign neoplasm of left choroid: Secondary | ICD-10-CM | POA: Diagnosis not present

## 2017-10-22 DIAGNOSIS — Z961 Presence of intraocular lens: Secondary | ICD-10-CM | POA: Diagnosis not present

## 2017-10-22 DIAGNOSIS — E113393 Type 2 diabetes mellitus with moderate nonproliferative diabetic retinopathy without macular edema, bilateral: Secondary | ICD-10-CM | POA: Diagnosis not present

## 2017-10-22 DIAGNOSIS — H40013 Open angle with borderline findings, low risk, bilateral: Secondary | ICD-10-CM | POA: Diagnosis not present

## 2017-10-22 LAB — HM DIABETES EYE EXAM

## 2017-10-25 ENCOUNTER — Telehealth: Payer: Self-pay | Admitting: *Deleted

## 2017-10-25 NOTE — Telephone Encounter (Signed)
Received Diabetic Eye Exam Report from [facility] Dr. Prentice Docker, OD; forwarded to provider/SLS 10/17

## 2017-12-20 DIAGNOSIS — Z96652 Presence of left artificial knee joint: Secondary | ICD-10-CM | POA: Diagnosis not present

## 2017-12-20 DIAGNOSIS — Z96651 Presence of right artificial knee joint: Secondary | ICD-10-CM | POA: Diagnosis not present

## 2018-01-11 NOTE — Progress Notes (Addendum)
[facility] [place] at [facility] [address] [place], [place] [address] [phone]  Date:  01/14/2018   Name:  Wanda [name]   DOB:  04-Oct-1940   MRN:  [id number]  PCP:  Darreld Mclean, MD    Chief Complaint: Discuss Insulin (last 6 months extreme fatigue, sleeping, itching all over body, fears its an allergy) and Back Pain (6 weeks, lower back pain, broke out with shingles on friday)   History of Present Illness:  Wanda [name] is a 78 y.o. very pleasant female patient who presents with the following:  History of diabetes, hypertension, hyperlipidemia, obesity. I last saw her in September, at that time she was taking Lantus at 20 to 25 units daily and reported that her sugar was still high Her A1c was 9.2%, and we had her increase glipizide to 10 mg  She is also using amlodipine 10, HCTZ 25, and Niaspan. She is not able to take metformin or statins, or ACE inhibitor's  Urine microalbumin is coming due now, can be done today.  Also due for A1c Last DEXA scan was 2 years ago, she is due for repeat  Today is Monday. This past Friday she had a shingles outbreak on her right flank.  This happens intermittently- her last outbreak was 2-3 years ago  She has tried valtrex in the past but she seemed not to tolerate it- she does not want to be treated as the shingles is not really painful, it is not bothering her that much.  She wonders if she might be 'allergic to insulin' She has not felt herself but cannot explain it.   She might fall asleep more easily, she feels like her legs get tired and ache if she is walking longer than 5 minutes or so.  Resting helps her legs to recover Her leg symptoms do seem to follow a persistent pattern, which may be representative of claudication She has noted this for a year or so   She does not have any chest pain  Home BP is generally 140- 150s/ 80s.  Pulse about 80  She has noted itching since she  started on insulin about 3 years ago.  As far she can recall she has always been on insulin glargine specifically Lantus Notes that she tends to itch more at night, when she is trying to go to sleep.  Itching may be anywhere on her body She decreased her lantus to 10 units.  She notes that her blood sugars been under pretty good control, as   Amlodipine 10 Glipizide hctz niaspan  She notes that she is "a nervous wreck," she has been wanting to "come here and get a miracle."  However she denies feeling anxious, and does not think that she is depressed She describes feeling just out of sorts.  But cannot really describe it further BP Readings from Last 3 Encounters:  01/14/18 (!) 190/82  09/13/17 (!) 142/92  03/01/17 (!) 160/90    Patient Active Problem List   Diagnosis Date Noted  . Primary osteoarthritis of left knee 10/04/2015  . Osteoporosis 08/23/2015  . Multinodular goiter (nontoxic) 04/07/2014  . Diabetes mellitus type 2, uncontrolled, without complications ([facility]) 29/52/8413  . Insulin adverse reaction 09/27/2013  . Senile nuclear sclerosis 08/14/2012  . HTN, goal below 130/80 03/10/2011  . Hyperlipidemia with target LDL less than 70 03/10/2011  . Obesity (BMI 30.0-34.9) 03/10/2011  . Health care maintenance 03/10/2011    Past  Medical History:  Diagnosis Date  . Arthritis   . Cataract   . Diabetes mellitus   . Hypertension   . Skin cancer    Removed from face  . Urgency of urination   . Urinary leakage     Past Surgical History:  Procedure Laterality Date  . ABDOMINAL HYSTERECTOMY  early 80's   total  . CARPAL TUNNEL RELEASE Bilateral 1980 and 1981   both hands  . EYE SURGERY     bilateral cataracts  . Fatty Tumor Excision    . JOINT REPLACEMENT    . TONSILLECTOMY    . TONSILLECTOMY AND ADENOIDECTOMY  age 53  . TOTAL KNEE ARTHROPLASTY Right 10/04/2015  . TOTAL KNEE ARTHROPLASTY Right 10/04/2015   Procedure: TOTAL KNEE ARTHROPLASTY;  Surgeon: Dorna Leitz,  MD;  Location: [facility];  Service: Orthopedics;  Laterality: Right;  . TOTAL KNEE ARTHROPLASTY Left 01/28/2016   Procedure: TOTAL KNEE ARTHROPLASTY;  Surgeon: Dorna Leitz, MD;  Location: [facility];  Service: Orthopedics;  Laterality: Left;  . TUBAL LIGATION      Social History   Tobacco Use  . Smoking status: Never Smoker  . Smokeless tobacco: Never Used  Substance Use Topics  . Alcohol use: Yes    Alcohol/week: 1.0 standard drinks    Types: 1 Standard drinks or equivalent per week  . Drug use: No    Family History  Problem Relation Age of Onset  . Pancreatitis Father        deceased 35  . Cancer Unknown   . Cancer Brother        GI  . Cancer Mother        liver  . Cancer Son        terminal kidney    Allergies  Allergen Reactions  . Ace Inhibitors Swelling and Cough    Pt had cough and diarrhea with first few doses of medication; also had swelling of right eyelid. Stopped medication on 03/17/11.  . Alendronate Sodium Other (See Comments)    Whole body hurt  . Aspirin Hives  . Lipitor [Atorvastatin Calcium] Other (See Comments)    Whole body hurt  . Metformin And Related     Severe abdominal pain  . Shellfish Allergy     Intolerant of fresh shellfish, reports the reaction is GI upset, denies hives, denies any swelling  Reports that she can tolerate canned seafood.     Medication list has been reviewed and updated.  Current Outpatient Medications on File Prior to Visit  Medication Sig Dispense Refill  . acetaminophen (TYLENOL) 325 MG tablet Take 650 mg by mouth every 6 (six) hours as needed for mild pain.    Marland Kitchen amLODipine (NORVASC) 10 MG tablet Take 1 tablet (10 mg total) by mouth daily. 90 tablet 3  . blood glucose meter kit and supplies Dispense based on patient and insurance preference. Pt just needs meter 1 each 0  . Blood Glucose Monitoring Suppl (ONE TOUCH ULTRA MINI) w/Device KIT Use to test blood sugar daily as instructed. Dx: E11.65 1 each 0  . glipiZIDE (GLUCOTROL  XL) 10 MG 24 hr tablet Take 1 tablet (10 mg total) by mouth daily with breakfast. 90 tablet 3  . glucose blood (ONE TOUCH ULTRA TEST) test strip Test blood sugar 3 times a day. Dx code: 250.00 100 each 12  . glucose blood test strip Test blood sugar 3 times a day. Dx code: 250.00 100 each 12  . hydrochlorothiazide (HYDRODIURIL) 25 MG  tablet Take 1 tablet (25 mg total) by mouth daily. 90 tablet 3  . Insulin Glargine (LANTUS SOLOSTAR) 100 UNIT/ML Solostar Pen Inject 20 Units into the skin daily at 10 pm. 15 mL 3  . Insulin Pen Needle (BD PEN NEEDLE NANO U/F) 32G X 4 MM MISC USE TO INJECT INSULIN ONCE DAILY 100 each 6  . Insulin Syringe-Needle U-100 (INSULIN SYRINGE .5CC/31GX5/16") 31G X 5/16" 0.5 ML MISC Use to inject insulin 1 time daily. 90 each 3  . niacin (NIASPAN) 500 MG CR tablet Take 1 tablet (500 mg total) by mouth at bedtime. Increase to 2 tablets after 1 month (Patient taking differently: Take 500 mg by mouth daily. ) 180 tablet 2  . vitamin E 200 UNIT capsule Take 200 Units by mouth daily. Reported on 05/31/2015    . [DISCONTINUED] lisinopril (PRINIVIL,ZESTRIL) 20 MG tablet Take 20 mg by mouth daily.      No current facility-administered medications on file prior to visit.     Review of Systems:  As per HPI- otherwise negative. No fever or chills, no chest pain or shortness of breath   Physical Examination: Vitals:   01/14/18 0847  BP: (!) 190/82  Pulse: (!) 103  Resp: 16  Temp: 97.8 F (36.6 C)  SpO2: 98%   Vitals:   01/14/18 0847  Weight: 176 lb (79.8 kg)  Height: 5' 1"  (1.549 m)   Body mass index is 33.25 kg/m. Ideal Body Weight: Weight in (lb) to have BMI = 25: 132  GEN: WDWN, NAD, Non-toxic, A & O x 3, overweight, looks well HEENT: Atraumatic, Normocephalic. Neck supple. No masses, No LAD. Ears and Nose: No external deformity. CV: RRR, No M/G/R. No JVD. No thrill. No extra heart sounds. PULM: CTA B, no wheezes, crackles, rhonchi. No retractions. No resp.  distress. No accessory muscle use. ABD: S, NT, ND, +BS. No rebound. No HSM. EXTR: No c/c/e NEURO Normal gait.  PSYCH: Normally interactive. Conversant. Not depressed or anxious appearing.  Calm demeanor.  Vesicular rash consistent with shingles across the right flank.  It does appear to be healing   Assessment and Plan: Diabetes mellitus type 2, uncontrolled, without complications ([place]) - Plan: insulin degludec (TRESIBA FLEXTOUCH) 100 UNIT/ML SOPN FlexTouch Pen, Hemoglobin A1c, Microalbumin / creatinine urine ratio, CANCELED: Hemoglobin A1c, CANCELED: Microalbumin / creatinine urine ratio  Essential hypertension - Plan: metoprolol succinate (TOPROL-XL) 25 MG 24 hr tablet  Hyperlipidemia with target LDL less than 70 - Plan: Lipid panel  Primary osteoarthritis of left knee  Age-related osteoporosis without current pathological fracture - Plan: DG Bone Density  Intermittent claudication (HCC) - Plan: VAS [place] ABI WITH/WO TBI  Other fatigue - Plan: TSH  Here today with a few concerns.  She has been on Lantus for several years, and wonders if she might be allergic or intolerant.  We will have her try [place] instead, this is a different insulin molecule.  Gave her a sample pen to try, she will do a 1: 1 unit exchange.  She will let me how this works for her. Add metoprolol 25 mg to her blood pressure regimen.. Set up for ankle-brachial indices, and also order a bone density exam  Will plan further follow- up pending labs.   Signed Lamar Blinks, MD  Addendum 01/15/2018 Received her labs as follows Message to patient  Lipids:    Component Value Date/Time   CHOL 234 (H) 01/15/2018 0728   TRIG 180.0 (H) 01/15/2018 0728   HDL 41.90 01/15/2018  2060   LDLDIRECT 156.0 04/15/2015 1020   VLDL 36.0 01/15/2018 0728   CHOLHDL 6 01/15/2018 0728   Lab Results  Component Value Date   TSH 1.33 01/15/2018   Lab Results  Component Value Date   HGBA1C 8.8 (H) 01/15/2018   The 10-year  ASCVD risk score Mikey Bussing DC Jr., et al., 2013) is: 57.2%   Values used to calculate the score:     Age: 78 years     Sex: Female     Is Non-Hispanic African American: No     Diabetic: Yes     Tobacco smoker: No     Systolic Blood Pressure: 156 mmHg     Is BP treated: Yes     HDL Cholesterol: 41.9 mg/dL     Total Cholesterol: 234 mg/dL

## 2018-01-14 ENCOUNTER — Encounter: Payer: Self-pay | Admitting: Family Medicine

## 2018-01-14 ENCOUNTER — Ambulatory Visit (INDEPENDENT_AMBULATORY_CARE_PROVIDER_SITE_OTHER): Payer: Medicare Other | Admitting: Family Medicine

## 2018-01-14 ENCOUNTER — Telehealth (HOSPITAL_COMMUNITY): Payer: Self-pay | Admitting: *Deleted

## 2018-01-14 VITALS — BP 150/90 | HR 103 | Temp 97.8°F | Resp 16 | Ht 61.0 in | Wt 176.0 lb

## 2018-01-14 DIAGNOSIS — E785 Hyperlipidemia, unspecified: Secondary | ICD-10-CM

## 2018-01-14 DIAGNOSIS — M810 Age-related osteoporosis without current pathological fracture: Secondary | ICD-10-CM

## 2018-01-14 DIAGNOSIS — M1712 Unilateral primary osteoarthritis, left knee: Secondary | ICD-10-CM

## 2018-01-14 DIAGNOSIS — E1165 Type 2 diabetes mellitus with hyperglycemia: Secondary | ICD-10-CM | POA: Diagnosis not present

## 2018-01-14 DIAGNOSIS — I10 Essential (primary) hypertension: Secondary | ICD-10-CM | POA: Diagnosis not present

## 2018-01-14 DIAGNOSIS — IMO0001 Reserved for inherently not codable concepts without codable children: Secondary | ICD-10-CM

## 2018-01-14 DIAGNOSIS — I739 Peripheral vascular disease, unspecified: Secondary | ICD-10-CM

## 2018-01-14 DIAGNOSIS — R5383 Other fatigue: Secondary | ICD-10-CM | POA: Diagnosis not present

## 2018-01-14 MED ORDER — METOPROLOL SUCCINATE ER 25 MG PO TB24: 25.0000 mg | ORAL_TABLET | Freq: Every day | ORAL | 6 refills | Status: DC

## 2018-01-14 MED ORDER — INSULIN DEGLUDEC 100 UNIT/ML ~~LOC~~ SOPN: 10.0000 [IU] | PEN_INJECTOR | Freq: Every day | SUBCUTANEOUS | 6 refills | Status: DC

## 2018-01-14 NOTE — Patient Instructions (Addendum)
It was nice to see you today, but I am sorry you are having a hard time. Your blood pressure is still borderline.  Continue your amlodipine and HCTZ.  We are going to add metoprolol XL 25 mg.  Take this once a day.  This may also help some with feelings of anxiety  Let us try a different type of insulin.  I gave you a sample pen of [place].  You will use this in place of Lantus, and you can exchange the number of units 1:1 Please let me know if this seems to help with any of your itching symptoms  We will set you up for ankle-brachial indices, to look for arterial blockage in your legs  You are also due for a bone density, which I will arrange for you  I will be in touch with your labs ASAP

## 2018-01-14 NOTE — Telephone Encounter (Addendum)
01/15/18 left msg asking pt to return my call to schedule appt for request from Dr. [name] Copland  01/14/2018 no answer/no msg left.  Attempted to reach pt to schedule appt for request from Dr. [name] Copland

## 2018-01-15 ENCOUNTER — Other Ambulatory Visit (INDEPENDENT_AMBULATORY_CARE_PROVIDER_SITE_OTHER): Payer: Medicare Other

## 2018-01-15 ENCOUNTER — Encounter: Payer: Self-pay | Admitting: Family Medicine

## 2018-01-15 DIAGNOSIS — IMO0001 Reserved for inherently not codable concepts without codable children: Secondary | ICD-10-CM

## 2018-01-15 DIAGNOSIS — R5383 Other fatigue: Secondary | ICD-10-CM | POA: Diagnosis not present

## 2018-01-15 DIAGNOSIS — E785 Hyperlipidemia, unspecified: Secondary | ICD-10-CM | POA: Diagnosis not present

## 2018-01-15 DIAGNOSIS — E1165 Type 2 diabetes mellitus with hyperglycemia: Secondary | ICD-10-CM

## 2018-01-15 LAB — LIPID PANEL
Cholesterol: 234 mg/dL — ABNORMAL HIGH (ref 0–200)
HDL: 41.9 mg/dL (ref >39.00)
LDL Cholesterol: 156 mg/dL — ABNORMAL HIGH (ref 0–99)
NONHDL: 192.05
TRIGLYCERIDES: 180 mg/dL — AB (ref 0.0–149.0)
Total CHOL/HDL Ratio: 6
VLDL: 36 mg/dL (ref 0.0–40.0)

## 2018-01-15 LAB — MICROALBUMIN / CREATININE URINE RATIO
Creatinine,U: 24.9 mg/dL
Microalb Creat Ratio: 3.9 mg/g (ref 0.0–30.0)
Microalb, Ur: 1 mg/dL (ref 0.0–1.9)

## 2018-01-15 LAB — HEMOGLOBIN A1C: HEMOGLOBIN A1C: 8.8 % — AB (ref 4.6–6.5)

## 2018-01-15 LAB — TSH: TSH: 1.33 u[IU]/mL (ref 0.35–4.50)

## 2018-01-16 ENCOUNTER — Encounter: Payer: Self-pay | Admitting: Family Medicine

## 2018-01-16 ENCOUNTER — Ambulatory Visit (HOSPITAL_BASED_OUTPATIENT_CLINIC_OR_DEPARTMENT_OTHER)
Admission: RE | Admit: 2018-01-16 | Discharge: 2018-01-16 | Disposition: A | Discharge status: Home / Self Care | Payer: Medicare Other | Source: Ambulatory Visit | Attending: Family Medicine | Admitting: Family Medicine

## 2018-01-16 DIAGNOSIS — M810 Age-related osteoporosis without current pathological fracture: Secondary | ICD-10-CM | POA: Diagnosis not present

## 2018-01-16 DIAGNOSIS — M8589 Other specified disorders of bone density and structure, multiple sites: Secondary | ICD-10-CM | POA: Diagnosis not present

## 2018-01-23 ENCOUNTER — Ambulatory Visit (HOSPITAL_COMMUNITY)
Admission: RE | Admit: 2018-01-23 | Discharge: 2018-01-23 | Disposition: A | Discharge status: Home / Self Care | Payer: Medicare Other | Source: Ambulatory Visit | Attending: Family | Admitting: Family

## 2018-01-23 DIAGNOSIS — I739 Peripheral vascular disease, unspecified: Secondary | ICD-10-CM | POA: Diagnosis not present

## 2018-01-25 ENCOUNTER — Telehealth: Payer: Self-pay | Admitting: Family Medicine

## 2018-01-25 ENCOUNTER — Encounter: Payer: Self-pay | Admitting: Family Medicine

## 2018-01-25 DIAGNOSIS — I739 Peripheral vascular disease, unspecified: Secondary | ICD-10-CM

## 2018-01-25 NOTE — Telephone Encounter (Signed)
Received her ABI screening:  Summary: Right: Resting right ankle-brachial index indicates mild right lower extremity arterial disease. The right toe-brachial index is abnormal. RT great toe pressure = 92 mmHg.  Left: Resting left ankle-brachial index is within normal range. No evidence of significant left lower extremity arterial disease. The left toe-brachial index is normal. LT Great toe pressure = 136 mmHg.  Called and LMOM, also will send her a Pharmacist, community message.  Referral to vascular surgery for eval for arterial disease   In the meantime continue to be active

## 2018-01-31 ENCOUNTER — Other Ambulatory Visit: Payer: Self-pay

## 2018-02-03 ENCOUNTER — Encounter: Payer: Self-pay | Admitting: Family Medicine

## 2018-02-27 ENCOUNTER — Telehealth: Payer: Self-pay

## 2018-02-27 NOTE — Telephone Encounter (Signed)
Copied from [place] [id number]. Topic: General - Other >> Feb 21, 2018 11:35 AM Antonieta Iba C wrote: Reason for CRM: Carla w/ [facility] called in to make provider aware that they have tried several times to reach pt to schedule her. While having Carla on the line I also tried reaching pt (no answer) Angela Nevin would like to know how would provider like for her to proceed. She has deferred referral for 1 week so that  they will try again.   CB: [phone]

## 2018-02-28 ENCOUNTER — Encounter: Payer: Self-pay | Admitting: Family Medicine

## 2018-02-28 NOTE — Telephone Encounter (Signed)
Certainly okay to try again in 1 week I will also send patient a MyChart message

## 2018-03-01 NOTE — Progress Notes (Addendum)
Subjective:   Wanda [name] is a 78 y.o. female who presents for Medicare Annual (Subsequent) preventive examination.  Review of Systems: No ROS.  Medicare Wellness Visit. Additional risk factors are reflected in the social history. Cardiac Risk Factors include: advanced age (>43mn, >>65women);diabetes mellitus;dyslipidemia;hypertension Sleep patterns:  No issues Home Safety/Smoke Alarms: Feels safe in home. Smoke alarms in place.  Lives alone. No stairs. Uses step over tub.   Female:        Mammo-  09/05/17     Dexa scan- 01/16/18 [facility]- Cologuard 05/18/15-neg Eye- every 6 months w/ Dr.Kowal. Next appt in April 2020. Wearing glasses.    Objective:     Vitals: BP 130/82 (BP Location: Left Arm, Patient Position: Sitting, Cuff Size: Normal)   Pulse 73   Ht 5' 1"  (1.549 m)   Wt 179 lb 3.2 oz (81.3 kg)   SpO2 97%   BMI 33.86 kg/m   Body mass index is 33.86 kg/m.  Advanced Directives 03/04/2018 03/01/2017 02/10/2016 01/20/2016 10/14/2015 10/04/2015 09/24/2015  Does Patient Have a Medical Advance Directive? No No No Yes No No No  Does patient want to make changes to medical advance directive? No - Patient declined - - - - - -  Would patient like information on creating a medical advance directive? - Yes (MAU/Ambulatory/Procedural Areas - Information given) No - Patient declined - No - patient declined information No - patient declined information Yes -Higher education careers advisergiven    Tobacco Social History   Tobacco Use  Smoking Status Never Smoker  Smokeless Tobacco Never Used     Counseling given: Not Answered   Clinical Intake:     Pain : No/denies pain                 Past Medical History:  Diagnosis Date  . Arthritis   . Cataract   . Diabetes mellitus   . Hypertension   . Skin cancer    Removed from face  . Urgency of urination   . Urinary leakage    Past Surgical History:  Procedure Laterality Date  . ABDOMINAL HYSTERECTOMY  early 80's   total  . CARPAL  TUNNEL RELEASE Bilateral 1980 and 1981   both hands  . EYE SURGERY     bilateral cataracts  . Fatty Tumor Excision    . JOINT REPLACEMENT    . TONSILLECTOMY    . TONSILLECTOMY AND ADENOIDECTOMY  age 78 . TOTAL KNEE ARTHROPLASTY Right 10/04/2015  . TOTAL KNEE ARTHROPLASTY Right 10/04/2015   Procedure: TOTAL KNEE ARTHROPLASTY;  Surgeon: JDorna Leitz MD;  [facility] [facility]  Service: Orthopedics;  Laterality: Right;  . TOTAL KNEE ARTHROPLASTY Left 01/28/2016   Procedure: TOTAL KNEE ARTHROPLASTY;  Surgeon: JDorna Leitz MD;  [facility]  Service: Orthopedics;  Laterality: Left;  . TUBAL LIGATION     Family History  Problem Relation Age of Onset  . Pancreatitis Father        deceased 376 . Cancer Other   . Cancer Brother        GI  . Cancer Mother        liver  . Cancer Son        terminal kidney   Social History   Socioeconomic History  . Marital status: Widowed    Spouse name: Not on file  . Number of children: Not on file  . Years of education: Not on file  . Highest education level: Not on file  Occupational History  . Not on file  Social Needs  . Financial resource strain: Not on file  . Food insecurity:    Worry: Not on file    Inability: Not on file  . Transportation needs:    Medical: Not on file    Non-medical: Not on file  Tobacco Use  . Smoking status: Never Smoker  . Smokeless tobacco: Never Used  Substance and Sexual Activity  . Alcohol use: Yes    Alcohol/week: 1.0 standard drinks    Types: 1 Standard drinks or equivalent per week    Comment: maybe once per month  . Drug use: No  . Sexual activity: Never  Lifestyle  . Physical activity:    Days per week: Not on file    Minutes per session: Not on file  . Stress: Not on file  Relationships  . Social connections:    Talks on phone: Not on file    Gets together: Not on file    Attends religious service: Not on file    Active member of club or organization: Not on file    Attends meetings of  clubs or organizations: Not on file    Relationship status: Not on file  Other Topics Concern  . Not on file  Social History Narrative   Widowed. Education: [employer]. Exercise: 2 times a week for 30 minutes.    Outpatient Encounter Medications as of 03/04/2018  Medication Sig  . amLODipine (NORVASC) 10 MG tablet Take 1 tablet (10 mg total) by mouth daily.  . blood glucose meter kit and supplies Dispense based on patient and insurance preference. Pt just needs meter  . Blood Glucose Monitoring Suppl (ONE TOUCH ULTRA MINI) w/Device KIT Use to test blood sugar daily as instructed. Dx: E11.65  . glipiZIDE (GLUCOTROL XL) 10 MG 24 hr tablet Take 1 tablet (10 mg total) by mouth daily with breakfast.  . glucose blood (ONE TOUCH ULTRA TEST) test strip Test blood sugar 3 times a day. Dx code: 250.00  . glucose blood test strip Test blood sugar 3 times a day. Dx code: 250.00  . hydrochlorothiazide (HYDRODIURIL) 25 MG tablet Take 1 tablet (25 mg total) by mouth daily.  . Insulin Glargine (LANTUS SOLOSTAR) 100 UNIT/ML Solostar Pen Inject 20 Units into the skin daily at 10 pm.  . Insulin Pen Needle (BD PEN NEEDLE NANO U/F) 32G X 4 MM MISC USE TO INJECT INSULIN ONCE DAILY  . Insulin Syringe-Needle U-100 (INSULIN SYRINGE .5CC/31GX5/16") 31G X 5/16" 0.5 ML MISC Use to inject insulin 1 time daily.  . metoprolol succinate (TOPROL-XL) 25 MG 24 hr tablet Take 1 tablet (25 mg total) by mouth daily.  . niacin (NIASPAN) 500 MG CR tablet Take 1 tablet (500 mg total) by mouth at bedtime. Increase to 2 tablets after 1 month (Patient taking differently: Take 500 mg by mouth daily. )  . vitamin E 200 UNIT capsule Take 200 Units by mouth daily. Reported on 05/31/2015  . acetaminophen (TYLENOL) 325 MG tablet Take 650 mg by mouth every 6 (six) hours as needed for mild pain.  Marland Kitchen insulin degludec (TRESIBA FLEXTOUCH) 100 UNIT/ML SOPN FlexTouch Pen Inject 0.1-0.15 mLs (10-15 Units total) into the skin daily. (Patient not taking:  Reported on 03/04/2018)  . [DISCONTINUED] lisinopril (PRINIVIL,ZESTRIL) 20 MG tablet Take 20 mg by mouth daily.    No facility-administered encounter medications on file as of 03/04/2018.     Activities of Daily Living In your present state of health, do  you have any difficulty performing the following activities: 03/04/2018  Hearing? N  Vision? N  Difficulty concentrating or making decisions? N  Comment reads and crossword puzzles  Walking or climbing stairs? N  Dressing or bathing? N  Doing errands, shopping? N  Preparing Food and eating ? N  Using the Toilet? N  In the past six months, have you accidently leaked urine? Y  Do you have problems with loss of bowel control? N  Managing your Medications? N  Managing your Finances? N  Housekeeping or managing your Housekeeping? N  Some recent data might be hidden    Patient Care Team: Copland, Gay Filler, MD as PCP - General (Family Medicine)    Assessment:   This is a routine wellness examination for [place]. Physical assessment deferred to PCP.  Exercise Activities and Dietary recommendations Current Exercise Habits: Structured exercise class, Time (Minutes): 30, Frequency (Times/Week): 2, Weekly Exercise (Minutes/Week): 60, Exercise limited by: None identified   Diet (meal preparation, eat out, water intake, caffeinated beverages, dairy products, fruits and vegetables): in general, a "healthy" diet  , well balanced Breakfast: yogurt and fruit. 2c doffee w/ cream Lunch: fish and salad Dinner:  Protein and vegetable. Drinks at least 6 glasses of water per day.  Goals    . Travel more     [facility] into [facility]       Fall Risk Fall Risk  03/04/2018 03/01/2017 12/16/2015 10/07/2014 09/26/2013  Falls in the past year? 0 No No No No  Comment - - Emmi Telephone Survey: data to providers prior to load - -   Depression Screen PHQ 2/9 Scores 03/04/2018 03/01/2017 10/18/2015 10/07/2014  PHQ - 2 Score 0 0 0 0     Cognitive  Function MMSE - Mini Mental State Exam 03/04/2018 03/01/2017  Orientation to time 5 5  Orientation to Place 5 5  Registration 3 3  Attention/ Calculation 5 5  Recall 3 3  Language- name 2 objects 2 2  Language- repeat 1 1  Language- follow 3 step command 3 3  Language- read & follow direction 1 1  Write a sentence 1 1  Copy design 1 1  Total score 30 30        There is no immunization history for the selected administration types on file for this patient.  Screening Tests Health Maintenance  Topic Date Due  . TETANUS/TDAP  09/14/2018 (Originally 01/09/2014)  . PNA vac Low Risk Adult (1 of 2 - PCV13) 09/14/2018 (Originally 01/11/2005)  . HEMOGLOBIN A1C  07/16/2018  . MAMMOGRAM  09/06/2018  . OPHTHALMOLOGY EXAM  10/23/2018  . FOOT EXAM  01/15/2019  . URINE MICROALBUMIN  01/16/2019  . DEXA SCAN  Completed  . INFLUENZA VACCINE  Discontinued       Plan:    Please schedule your next medicare wellness visit with me in 1 yr.  Continue to eat heart healthy diet (full of fruits, vegetables, whole grains, lean protein, water--limit salt, fat, and sugar intake) and increase physical activity as tolerated.  Bring a copy of your living will and/or healthcare power of attorney to your next office visit.   I have personally reviewed and noted the following in the patient's chart:   . Medical and social history . Use of alcohol, tobacco or illicit drugs  . Current medications and supplements . Functional ability and status . Nutritional status . Physical activity . Advanced directives . List of other physicians . Hospitalizations, surgeries, and ER visits  in previous 12 months . Vitals . Screenings to include cognitive, depression, and falls . Referrals and appointments  In addition, I have reviewed and discussed with patient certain preventive protocols, quality metrics, and best practice recommendations. A written personalized care plan for preventive services as well as general  preventive health recommendations were provided to patient.     Shela Nevin, [place]  03/04/2018  I have reviewed the above MWE by Ms. Vevelyn Royals and agree with her documentation  Denny Peon MD

## 2018-03-04 ENCOUNTER — Ambulatory Visit (INDEPENDENT_AMBULATORY_CARE_PROVIDER_SITE_OTHER): Payer: Medicare Other | Admitting: *Deleted

## 2018-03-04 ENCOUNTER — Encounter: Payer: Self-pay | Admitting: *Deleted

## 2018-03-04 VITALS — BP 130/82 | HR 73 | Ht 61.0 in | Wt 179.2 lb

## 2018-03-04 DIAGNOSIS — Z0000 Encounter for general adult medical examination without abnormal findings: Secondary | ICD-10-CM

## 2018-03-04 NOTE — Patient Instructions (Signed)
Please schedule your next medicare wellness visit with me in 1 yr.  Continue to eat heart healthy diet (full of fruits, vegetables, whole grains, lean protein, water--limit salt, fat, and sugar intake) and increase physical activity as tolerated.  Bring a copy of your living will and/or healthcare power of attorney to your next office visit.   Wanda [name] , Thank you for taking time to come for your Medicare Wellness Visit. I appreciate your ongoing commitment to your health goals. Please review the following plan we discussed and let me know if I can assist you in the future.   These are the goals we discussed: Goals    . Travel more     [facility] into [facility]       This is a list of the screening recommended for you and due dates:  Health Maintenance  Topic Date Due  . Tetanus Vaccine  09/14/2018*  . Pneumonia vaccines (1 of 2 - PCV13) 09/14/2018*  . Hemoglobin A1C  07/16/2018  . Mammogram  09/06/2018  . Eye exam for diabetics  10/23/2018  . Complete foot exam   01/15/2019  . Urine Protein Check  01/16/2019  . DEXA scan (bone density measurement)  Completed  . Flu Shot  Discontinued  *Topic was postponed. The date shown is not the original due date.    Health Maintenance After Age 66 After age 32, you are at a higher risk for certain long-term diseases and infections as well as injuries from falls. Falls are a major cause of broken bones and head injuries in people who are older than age 65. Getting regular preventive care can help to keep you healthy and well. Preventive care includes getting regular testing and making lifestyle changes as recommended by your health care provider. Talk with your health care provider about:  Which screenings and tests you should have. A screening is a test that checks for a disease when you have no symptoms.  A diet and exercise plan that is right for you. What should I know about screenings and tests to prevent falls? Screening and  testing are the best ways to find a health problem early. Early diagnosis and treatment give you the best chance of managing medical conditions that are common after age 53. Certain conditions and lifestyle choices may make you more likely to have a fall. Your health care provider may recommend:  Regular vision checks. Poor vision and conditions such as cataracts can make you more likely to have a fall. If you wear glasses, make sure to get your prescription updated if your vision changes.  Medicine review. Work with your health care provider to regularly review all of the medicines you are taking, including over-the-counter medicines. Ask your health care provider about any side effects that may make you more likely to have a fall. Tell your health care provider if any medicines that you take make you feel dizzy or sleepy.  Osteoporosis screening. Osteoporosis is a condition that causes the bones to get weaker. This can make the bones weak and cause them to break more easily.  Blood pressure screening. Blood pressure changes and medicines to control blood pressure can make you feel dizzy.  Strength and balance checks. Your health care provider may recommend certain tests to check your strength and balance while standing, walking, or changing positions.  Foot health exam. Foot pain and numbness, as well as not wearing proper footwear, can make you more likely to have a fall.  Depression screening. You may be more likely to have a fall if you have a fear of falling, feel emotionally low, or feel unable to do activities that you used to do.  Alcohol use screening. Using too much alcohol can affect your balance and may make you more likely to have a fall. What actions can I take to lower my risk of falls? General instructions  Talk with your health care provider about your risks for falling. Tell your health care provider if: ? You fall. Be sure to tell your health care provider about all falls,  even ones that seem minor. ? You feel dizzy, sleepy, or off-balance.  Take over-the-counter and prescription medicines only as told by your health care provider. These include any supplements.  Eat a healthy diet and maintain a healthy weight. A healthy diet includes low-fat dairy products, low-fat (lean) meats, and fiber from whole grains, beans, and lots of fruits and vegetables. Home safety  Remove any tripping hazards, such as rugs, cords, and clutter.  Install safety equipment such as grab bars in bathrooms and safety rails on stairs.  Keep rooms and walkways well-lit. Activity   Follow a regular exercise program to stay fit. This will help you maintain your balance. Ask your health care provider what types of exercise are appropriate for you.  If you need a cane or walker, use it as recommended by your health care provider.  Wear supportive shoes that have nonskid soles. Lifestyle  Do not drink alcohol if your health care provider tells you not to drink.  If you drink alcohol, limit how much you have: ? 0-1 drink a day for women. ? 0-2 drinks a day for men.  Be aware of how much alcohol is in your drink. In the [place] one drink equals one typical bottle of beer (12 oz), one-half glass of wine (5 oz), or one shot of hard liquor (1 oz).  Do not use any products that contain nicotine or tobacco, such as cigarettes and e-cigarettes. If you need help quitting, ask your health care provider. Summary  Having a healthy lifestyle and getting preventive care can help to protect your health and wellness after age 64.  Screening and testing are the best way to find a health problem early and help you avoid having a fall. Early diagnosis and treatment give you the best chance for managing medical conditions that are more common for people who are older than age 27.  Falls are a major cause of broken bones and head injuries in people who are older than age 56. Take precautions to  prevent a fall at home.  Work with your health care provider to learn what changes you can make to improve your health and wellness and to prevent falls. This information is not intended to replace advice given to you by your health care provider. Make sure you discuss any questions you have with your health care provider. Document Released: 11/08/2016 Document Revised: 11/08/2016 Document Reviewed: 11/08/2016 Elsevier Interactive Patient Education  2019 [employer].

## 2018-03-06 ENCOUNTER — Other Ambulatory Visit: Payer: Self-pay

## 2018-03-06 DIAGNOSIS — I10 Essential (primary) hypertension: Secondary | ICD-10-CM

## 2018-03-06 MED ORDER — METOPROLOL SUCCINATE ER 25 MG PO TB24: 25.0000 mg | ORAL_TABLET | Freq: Every day | ORAL | 1 refills | Status: DC

## 2018-03-26 ENCOUNTER — Encounter: Payer: Self-pay | Admitting: Family Medicine

## 2018-03-26 MED ORDER — INSULIN GLARGINE 100 UNIT/ML SOLOSTAR PEN: 15.0000 [IU] | PEN_INJECTOR | Freq: Every day | SUBCUTANEOUS | 11 refills | Status: DC

## 2018-04-04 ENCOUNTER — Encounter: Payer: Self-pay | Admitting: Family Medicine

## 2018-04-09 ENCOUNTER — Encounter: Payer: Self-pay | Admitting: Vascular Surgery

## 2018-06-10 DIAGNOSIS — L821 Other seborrheic keratosis: Secondary | ICD-10-CM | POA: Diagnosis not present

## 2018-06-10 DIAGNOSIS — L814 Other melanin hyperpigmentation: Secondary | ICD-10-CM | POA: Diagnosis not present

## 2018-06-10 DIAGNOSIS — D225 Melanocytic nevi of trunk: Secondary | ICD-10-CM | POA: Diagnosis not present

## 2018-06-10 DIAGNOSIS — D1801 Hemangioma of skin and subcutaneous tissue: Secondary | ICD-10-CM | POA: Diagnosis not present

## 2018-07-02 ENCOUNTER — Ambulatory Visit (INDEPENDENT_AMBULATORY_CARE_PROVIDER_SITE_OTHER): Payer: Medicare Other | Admitting: Vascular Surgery

## 2018-07-02 ENCOUNTER — Other Ambulatory Visit: Payer: Self-pay

## 2018-07-02 ENCOUNTER — Encounter: Payer: Self-pay | Admitting: Vascular Surgery

## 2018-07-02 VITALS — BP 168/83 | HR 100 | Temp 97.7°F | Resp 20 | Ht 61.0 in | Wt 181.0 lb

## 2018-07-02 DIAGNOSIS — M25561 Pain in right knee: Secondary | ICD-10-CM | POA: Diagnosis not present

## 2018-07-02 DIAGNOSIS — G8929 Other chronic pain: Secondary | ICD-10-CM | POA: Diagnosis not present

## 2018-07-02 DIAGNOSIS — M25562 Pain in left knee: Secondary | ICD-10-CM | POA: Diagnosis not present

## 2018-07-02 NOTE — Progress Notes (Signed)
[facility]  Patient name: Wanda [name] MRN: [id number] DOB: 05-13-1940 Sex: female  REASON FOR CONSULT: Evaluation of lower extremity pain to rule out arterial Kolls  HPI: Wanda [name] is a 78 y.o. female, who is here today for evaluation of lower extremity discomfort.  She reports that this is limited to her knee region specifically.  She does not have any calf claudication type symptoms.  This is made worse by walking.  She has had total knee replacement staged bilaterally in 2017 2018.  She reports that her pain is certainly better than before surgery but has had persistent pain in her knees.  She had undergone noninvasive studies and is here today for discussion of this.  He has no history of lower extremity tissue loss.  Again specifically no calf symptoms.  No history of cardiac disease or stroke  Past Medical History:  Diagnosis Date  . Arthritis   . Cataract   . Diabetes mellitus   . Hypertension   . Skin cancer    Removed from face  . Urgency of urination   . Urinary leakage     Family History  Problem Relation Age of Onset  . Pancreatitis Father        deceased 20  . Cancer Other   . Cancer Brother        GI  . Cancer Mother        liver  . Cancer Son        terminal kidney    SOCIAL HISTORY: Social History   Socioeconomic History  . Marital status: Widowed    Spouse name: Not on file  . Number of children: Not on file  . Years of education: Not on file  . Highest education level: Not on file  Occupational History  . Not on file  Social Needs  . Financial resource strain: Not on file  . Food insecurity    Worry: Not on file    Inability: Not on file  . Transportation needs    Medical: Not on file    Non-medical: Not on file  Tobacco Use  . Smoking status: Never Smoker  . Smokeless tobacco: Never Used  Substance and Sexual Activity  . Alcohol use: Yes    Alcohol/week: 1.0 standard  drinks    Types: 1 Standard drinks or equivalent per week    Comment: maybe once per month  . Drug use: No  . Sexual activity: Never  Lifestyle  . Physical activity    Days per week: Not on file    Minutes per session: Not on file  . Stress: Not on file  Relationships  . Social Herbalist on phone: Not on file    Gets together: Not on file    Attends religious service: Not on file    Active member of club or organization: Not on file    Attends meetings of clubs or organizations: Not on file    Relationship status: Not on file  . Intimate partner violence    Fear of current or ex partner: Not on file    Emotionally abused: Not on file    Physically abused: Not on file    Forced sexual activity: Not on file  Other Topics Concern  . Not on file  Social History Narrative   Widowed. Education: [employer]. Exercise: 2 times a week for 30 minutes.    Allergies  Allergen Reactions  . Ace Inhibitors  Swelling and Cough    Pt had cough and diarrhea with first few doses of medication; also had swelling of right eyelid. Stopped medication on 03/17/11.  . Alendronate Sodium Other (See Comments)    Whole body hurt  . Aspirin Hives  . Lipitor [Atorvastatin Calcium] Other (See Comments)    Whole body hurt  . Metformin And Related     Severe abdominal pain  . Shellfish Allergy     Intolerant of fresh shellfish, reports the reaction is GI upset, denies hives, denies any swelling  Reports that she can tolerate canned seafood.     Current Outpatient Medications  Medication Sig Dispense Refill  . acetaminophen (TYLENOL) 325 MG tablet Take 650 mg by mouth every 6 (six) hours as needed for mild pain.    Marland Kitchen amLODipine (NORVASC) 10 MG tablet Take 1 tablet (10 mg total) by mouth daily. 90 tablet 3  . blood glucose meter kit and supplies Dispense based on patient and insurance preference. Pt just needs meter 1 each 0  . Blood Glucose Monitoring Suppl (ONE TOUCH ULTRA MINI) w/Device KIT Use  to test blood sugar daily as instructed. Dx: E11.65 1 each 0  . glipiZIDE (GLUCOTROL XL) 10 MG 24 hr tablet Take 1 tablet (10 mg total) by mouth daily with breakfast. 90 tablet 3  . glucose blood (ONE TOUCH ULTRA TEST) test strip Test blood sugar 3 times a day. Dx code: 250.00 100 each 12  . glucose blood test strip Test blood sugar 3 times a day. Dx code: 250.00 100 each 12  . hydrochlorothiazide (HYDRODIURIL) 25 MG tablet Take 1 tablet (25 mg total) by mouth daily. 90 tablet 3  . Insulin Glargine (LANTUS SOLOSTAR) 100 UNIT/ML Solostar Pen Inject 15-20 Units into the skin daily. 5 pen 11  . Insulin Pen Needle (BD PEN NEEDLE NANO U/F) 32G X 4 MM MISC USE TO INJECT INSULIN ONCE DAILY 100 each 6  . Insulin Syringe-Needle U-100 (INSULIN SYRINGE .5CC/31GX5/16") 31G X 5/16" 0.5 ML MISC Use to inject insulin 1 time daily. 90 each 3  . niacin (NIASPAN) 500 MG CR tablet Take 1 tablet (500 mg total) by mouth at bedtime. Increase to 2 tablets after 1 month (Patient taking differently: Take 500 mg by mouth daily. ) 180 tablet 2  . vitamin E 200 UNIT capsule Take 200 Units by mouth daily. Reported on 05/31/2015     No current facility-administered medications for this visit.     REVIEW OF SYSTEMS:  _0  denotes positive finding, _1  denotes negative finding Cardiac  Comments:  Chest pain or chest pressure:    Shortness of breath upon exertion:    Short of breath when lying flat:    Irregular heart rhythm:        Vascular    Pain in calf, thigh, or hip brought on by ambulation:    Pain in feet at night that wakes you up from your sleep:     Blood clot in your veins:    Leg swelling:         Pulmonary    Oxygen at home:    Productive cough:     Wheezing:         Neurologic    Sudden weakness in arms or legs:     Sudden numbness in arms or legs:     Sudden onset of difficulty speaking or slurred speech:    Temporary loss of vision in one eye:     Problems  with dizziness:          Gastrointestinal    Blood in stool:     Vomited blood:         Genitourinary    Burning when urinating:     Blood in urine:        Psychiatric    Major depression:         Hematologic    Bleeding problems:    Problems with blood clotting too easily:        Skin    Rashes or ulcers:        Constitutional    Fever or chills:      PHYSICAL EXAM: Vitals:   07/02/18 1259  BP: (!) 168/83  Pulse: 100  Resp: 20  Temp: 97.7 F (36.5 C)  SpO2: 98%  Weight: 82.1 kg  Height: _0  (1.549 m)    GENERAL: The patient is a well-nourished female, in no acute distress. The vital signs are documented above. CARDIOVASCULAR: Carotid arteries without bruits bilaterally.  2+ radial pulses bilaterally.  2+ left dorsalis pedis pulse and absent pedal pulses on the right PULMONARY: There is good air exchange  ABDOMEN: Soft and non-tender  MUSCULOSKELETAL: There are no major deformities or cyanosis. NEUROLOGIC: No focal weakness or paresthesias are detected. SKIN: There are no ulcers or rashes noted. PSYCHIATRIC: The patient has a normal affect.  DATA:  Noninvasive studies normal on the left at 1.1 ABI.  On the right 0.86  MEDICAL ISSUES: Had long discussion with the patient regarding this.  And that she does have normal arterial flow to the left and mildly diminished flow on the right.  I do not feel that this is causing any symptoms to her.  I would not recommend any further evaluation.  She was reassured with this discussion will see [place] again on an as-needed basis   Rosetta Posner, MD [facility] Tel [phone] Pager [phone]

## 2018-08-05 ENCOUNTER — Encounter: Payer: Self-pay | Admitting: Family Medicine

## 2018-08-06 MED ORDER — GLUCOSE BLOOD VI STRP: ORAL_STRIP | 12 refills | Status: DC

## 2018-08-19 ENCOUNTER — Other Ambulatory Visit: Payer: Self-pay

## 2018-08-19 ENCOUNTER — Encounter: Payer: Self-pay | Admitting: Family Medicine

## 2018-08-20 NOTE — Progress Notes (Addendum)
[place] at [facility] [address] [place], [place] [address] [phone]  Date:  08/21/2018   Name:  Wanda [name]   DOB:  [birth year]   MRN:  [id number]  PCP:  Wanda Mclean, MD    Chief Complaint: Diabetes (follow up), Hypertension (stopped takin meds due to side effects-sugar has gone down after stopping med), and Finger Nail Issue   History of Present Illness:  Wanda [name] is a 78 y.o. very pleasant female patient who presents with the following:  Pt with history of HTN, IDDM, goiter, osteoporosis, hyperlipidemia Here today for a periodic recheck visit  Last seen by myself in January: Here today with a few concerns.  She has been on Lantus for several years, and wonders if she might be allergic or intolerant.  We will have her try [place] instead, this is a different insulin molecule.  Gave her a sample pen to try, she will do a 1: 1 unit exchange.  She will let me how this works for her. Add metoprolol 25 mg to her blood pressure regimen.. Set up for ankle-brachial indices, and also order a bone density exam  I had her see vascular for mildly abnl ABI- they did not suggest any other therapy at this time   Due for A1c mammo due this month Offer pneumonia vaccine-she declines Suggest shingles    She stopped taking amlodipine and HCTZ- she stopped it about a month ago as it seemed to make her "mind foggy."  She does feel that she is thinking more clearly off of these medications, but understands that her blood pressure is an issue We also started and then stopped metoprolol this year-started due to uncontrolled blood pressure.  For a while she liked it, but then felt that it made her feel faint. Her BP at home is currently running 144- 167/ 72- 85  She does notice that her blood sugar seems to be improved She was not able to use the tresiba due to expense She is taking lantus 16 units daily now as well as glipizide and  her niaspan  She is fasting from 3 pm till the morning   She notes that she is "so tired all the time" for 3-4 months  No other particular sx that she has noticed No fever or chills BP Readings from Last 3 Encounters:  08/21/18 (!) 184/86  07/02/18 (!) 168/83  03/04/18 130/82    Patient Active Problem List   Diagnosis Date Noted  . Primary osteoarthritis of left knee 10/04/2015  . Osteoporosis 08/23/2015  . Multinodular goiter (nontoxic) 04/07/2014  . Diabetes mellitus type 2, uncontrolled, without complications ([facility]) 17/79/3903  . Insulin adverse reaction 09/27/2013  . Senile nuclear sclerosis 08/14/2012  . HTN, goal below 130/80 03/10/2011  . Hyperlipidemia with target LDL less than 70 03/10/2011  . Obesity (BMI 30.0-34.9) 03/10/2011    Past Medical History:  Diagnosis Date  . Arthritis   . Cataract   . Diabetes mellitus   . Hypertension   . Skin cancer    Removed from face  . Urgency of urination   . Urinary leakage     Past Surgical History:  Procedure Laterality Date  . ABDOMINAL HYSTERECTOMY  early 80's   total  . CARPAL TUNNEL RELEASE Bilateral 1980 and 1981   both hands  . EYE SURGERY     bilateral cataracts  . Fatty Tumor Excision    . JOINT REPLACEMENT    .  TONSILLECTOMY    . TONSILLECTOMY AND ADENOIDECTOMY  age 30  . TOTAL KNEE ARTHROPLASTY Right 10/04/2015  . TOTAL KNEE ARTHROPLASTY Right 10/04/2015   Procedure: TOTAL KNEE ARTHROPLASTY;  Surgeon: Dorna Leitz, MD;  Location: [facility];  Service: Orthopedics;  Laterality: Right;  . TOTAL KNEE ARTHROPLASTY Left 01/28/2016   Procedure: TOTAL KNEE ARTHROPLASTY;  Surgeon: Dorna Leitz, MD;  Location: [facility];  Service: Orthopedics;  Laterality: Left;  . TUBAL LIGATION      Social History   Tobacco Use  . Smoking status: Never Smoker  . Smokeless tobacco: Never Used  Substance Use Topics  . Alcohol use: Yes    Alcohol/week: 1.0 standard drinks    Types: 1 Standard drinks or equivalent per week    Comment:  maybe once per month  . Drug use: No    Family History  Problem Relation Age of Onset  . Pancreatitis Father        deceased 60  . Cancer Other   . Cancer Brother        GI  . Cancer Mother        liver  . Cancer Son        terminal kidney    Allergies  Allergen Reactions  . Ace Inhibitors Swelling and Cough    Pt had cough and diarrhea with first few doses of medication; also had swelling of right eyelid. Stopped medication on 03/17/11.  . Alendronate Sodium Other (See Comments)    Whole body hurt  . Aspirin Hives  . Lipitor [Atorvastatin Calcium] Other (See Comments)    Whole body hurt  . Metformin And Related     Severe abdominal pain  . Shellfish Allergy     Intolerant of fresh shellfish, reports the reaction is GI upset, denies hives, denies any swelling  Reports that she can tolerate canned seafood.     Medication list has been reviewed and updated.  Current Outpatient Medications on File Prior to Visit  Medication Sig Dispense Refill  . acetaminophen (TYLENOL) 325 MG tablet Take 650 mg by mouth every 6 (six) hours as needed for mild pain.    . blood glucose meter kit and supplies Dispense based on patient and insurance preference. Pt just needs meter 1 each 0  . Blood Glucose Monitoring Suppl (ONE TOUCH ULTRA MINI) w/Device KIT Use to test blood sugar daily as instructed. Dx: E11.65 1 each 0  . glipiZIDE (GLUCOTROL XL) 10 MG 24 hr tablet Take 1 tablet (10 mg total) by mouth daily with breakfast. 90 tablet 3  . glucose blood (ONE TOUCH ULTRA TEST) test strip Test blood sugar 3 times a day. Dx code: 250.00 100 each 12  . glucose blood test strip Test blood sugar 3 times a day. Dx code: 250.00 100 each 12  . Insulin Glargine (LANTUS SOLOSTAR) 100 UNIT/ML Solostar Pen Inject 15-20 Units into the skin daily. 5 pen 11  . Insulin Pen Needle (BD PEN NEEDLE NANO U/F) 32G X 4 MM MISC USE TO INJECT INSULIN ONCE DAILY 100 each 6  . Insulin Syringe-Needle U-100 (INSULIN SYRINGE  .5CC/31GX5/16") 31G X 5/16" 0.5 ML MISC Use to inject insulin 1 time daily. 90 each 3  . niacin (NIASPAN) 500 MG CR tablet Take 1 tablet (500 mg total) by mouth at bedtime. Increase to 2 tablets after 1 month (Patient taking differently: Take 500 mg by mouth daily. ) 180 tablet 2  . vitamin E 200 UNIT capsule Take 200 Units by mouth  daily. Reported on 05/31/2015    . amLODipine (NORVASC) 10 MG tablet Take 1 tablet (10 mg total) by mouth daily. (Patient not taking: Reported on 08/21/2018) 90 tablet 3  . hydrochlorothiazide (HYDRODIURIL) 25 MG tablet Take 1 tablet (25 mg total) by mouth daily. (Patient not taking: Reported on 08/21/2018) 90 tablet 3  . [DISCONTINUED] lisinopril (PRINIVIL,ZESTRIL) 20 MG tablet Take 20 mg by mouth daily.      No current facility-administered medications on file prior to visit.     Review of Systems:  As per HPI- otherwise negative.  No chest pain or shortness of breath No rash Physical Examination: Vitals:   08/21/18 1332  BP: (!) 184/86  Pulse: 90  Resp: 16  Temp: 98.7 F (37.1 C)  SpO2: 97%   Vitals:   08/21/18 1332  Weight: 181 lb (82.1 kg)  Height: 5' 1" (1.549 m)   Body mass index is 34.2 kg/m. Ideal Body Weight: Weight in (lb) to have BMI = 25: 132  GEN: WDWN, NAD, Non-toxic, A & O x 3, overweight, looks well HEENT: Atraumatic, Normocephalic. Neck supple. No masses, No LAD.  TM wnl  Ears and Nose: No external deformity. CV: RRR, No M/G/R. No JVD. No thrill. No extra heart sounds. PULM: CTA B, no wheezes, crackles, rhonchi. No retractions. No resp. distress. No accessory muscle use. ABD: S, NT, ND, +BS. No rebound. No HSM. EXTR: No c/c/e NEURO Normal gait.  PSYCH: Normally interactive. Conversant. Not depressed or anxious appearing.  Calm demeanor.   Wt Readings from Last 3 Encounters:  08/21/18 181 lb (82.1 kg)  07/02/18 181 lb (82.1 kg)  03/04/18 179 lb 3.2 oz (81.3 kg)    Assessment and Plan:   ICD-10-CM   1. Diabetes mellitus  type 2, uncontrolled, without complications (HCC)  V49.44 Hemoglobin A1c  2. Essential hypertension  I10 CBC    Comprehensive metabolic panel  3. Hyperlipidemia with target LDL less than 70  E78.5 Lipid panel  4. Age-related osteoporosis without current pathological fracture  M81.0   5. Other fatigue  R53.83 TSH    Ferritin  6. Anemia, unspecified type  D64.9 Ferritin   Following up today on a few concerns Check A1c, she is currently taking insulin and glipizide Lipids pending She has noticed fatigue, check TSH and ferritin We have had some difficulty treating her blood pressure, as she is allergic to ACE inhibitor's, and has now stopped a beta-blocker, diuretic, and CCB due to possible intolerance Explained that as she stopped her hydrochlorothiazide and amlodipine at the same time, it is hard to know which medicine might have actually caused her side effects.  I have asked her to please start back up back on amlodipine on its own, and see if she may tolerate this She agrees, will see me in 1 month for recheck  Follow-up: No follow-ups on file.  No orders of the defined types were placed in this encounter.  Orders Placed This Encounter  Procedures  . CBC  . Comprehensive metabolic panel  . Hemoglobin A1c  . Lipid panel  . TSH  . Ferritin    _0 @    Signed Lamar Blinks, MD  Received her labs, message to pt  Results for orders placed or performed in visit on 08/21/18  CBC  Result Value Ref Range   WBC 6.0 4.0 - 10.5 K/uL   RBC 4.43 3.87 - 5.11 Mil/uL   Platelets 234.0 150.0 - 400.0 K/uL   Hemoglobin 13.5 12.0 - 15.0  g/dL   HCT 40.0 36.0 - 46.0 %   MCV 90.2 78.0 - 100.0 fl   MCHC 33.9 30.0 - 36.0 g/dL   RDW 14.2 11.5 - 15.5 %  Comprehensive metabolic panel  Result Value Ref Range   Sodium 139 135 - 145 mEq/L   Potassium 4.1 3.5 - 5.1 mEq/L   Chloride 105 96 - 112 mEq/L   CO2 25 19 - 32 mEq/L   Glucose, Bld 137 (H) 70 - 99 mg/dL   BUN 14 6 - 23 mg/dL    Creatinine, Ser 0.87 0.40 - 1.20 mg/dL   Total Bilirubin 0.5 0.2 - 1.2 mg/dL   Alkaline Phosphatase 46 39 - 117 U/L   AST 14 0 - 37 U/L   ALT 15 0 - 35 U/L   Total Protein 7.4 6.0 - 8.3 g/dL   Albumin 4.5 3.5 - 5.2 g/dL   Calcium 10.5 8.4 - 10.5 mg/dL   GFR 62.87 >60.00 mL/min  Hemoglobin A1c  Result Value Ref Range   Hgb A1c MFr Bld 8.5 (H) 4.6 - 6.5 %  Lipid panel  Result Value Ref Range   Cholesterol 252 (H) 0 - 200 mg/dL   Triglycerides 188.0 (H) 0.0 - 149.0 mg/dL   HDL 43.00 >39.00 mg/dL   VLDL 37.6 0.0 - 40.0 mg/dL   LDL Cholesterol 171 (H) 0 - 99 mg/dL   Total CHOL/HDL Ratio 6    NonHDL 208.69   TSH  Result Value Ref Range   TSH 1.06 0.35 - 4.50 uIU/mL  Ferritin  Result Value Ref Range   Ferritin 43.3 10.0 - 291.0 ng/mL   A1c is slightly better intolerance to lipitor

## 2018-08-21 ENCOUNTER — Encounter: Payer: Self-pay | Admitting: Family Medicine

## 2018-08-21 ENCOUNTER — Ambulatory Visit (INDEPENDENT_AMBULATORY_CARE_PROVIDER_SITE_OTHER): Payer: Medicare Other | Admitting: Family Medicine

## 2018-08-21 ENCOUNTER — Other Ambulatory Visit: Payer: Self-pay

## 2018-08-21 VITALS — BP 184/86 | HR 90 | Temp 98.7°F | Resp 16 | Ht 61.0 in | Wt 181.0 lb

## 2018-08-21 DIAGNOSIS — IMO0001 Reserved for inherently not codable concepts without codable children: Secondary | ICD-10-CM

## 2018-08-21 DIAGNOSIS — E785 Hyperlipidemia, unspecified: Secondary | ICD-10-CM | POA: Diagnosis not present

## 2018-08-21 DIAGNOSIS — E1165 Type 2 diabetes mellitus with hyperglycemia: Secondary | ICD-10-CM | POA: Diagnosis not present

## 2018-08-21 DIAGNOSIS — I10 Essential (primary) hypertension: Secondary | ICD-10-CM

## 2018-08-21 DIAGNOSIS — R5383 Other fatigue: Secondary | ICD-10-CM | POA: Diagnosis not present

## 2018-08-21 DIAGNOSIS — M810 Age-related osteoporosis without current pathological fracture: Secondary | ICD-10-CM | POA: Diagnosis not present

## 2018-08-21 DIAGNOSIS — D649 Anemia, unspecified: Secondary | ICD-10-CM | POA: Diagnosis not present

## 2018-08-21 LAB — LIPID PANEL
Cholesterol: 252 mg/dL — ABNORMAL HIGH (ref 0–200)
HDL: 43 mg/dL (ref >39.00)
LDL Cholesterol: 171 mg/dL — ABNORMAL HIGH (ref 0–99)
NonHDL: 208.69
Total CHOL/HDL Ratio: 6
Triglycerides: 188 mg/dL — ABNORMAL HIGH (ref 0.0–149.0)
VLDL: 37.6 mg/dL (ref 0.0–40.0)

## 2018-08-21 LAB — CBC
HCT: 40 % (ref 36.0–46.0)
Hemoglobin: 13.5 g/dL (ref 12.0–15.0)
MCHC: 33.9 g/dL (ref 30.0–36.0)
MCV: 90.2 fl (ref 78.0–100.0)
Platelets: 234 10*3/uL (ref 150.0–400.0)
RBC: 4.43 Mil/uL (ref 3.87–5.11)
RDW: 14.2 % (ref 11.5–15.5)
WBC: 6 10*3/uL (ref 4.0–10.5)

## 2018-08-21 LAB — HEMOGLOBIN A1C: Hgb A1c MFr Bld: 8.5 % — ABNORMAL HIGH (ref 4.6–6.5)

## 2018-08-21 LAB — COMPREHENSIVE METABOLIC PANEL
ALT: 15 U/L (ref 0–35)
AST: 14 U/L (ref 0–37)
Albumin: 4.5 g/dL (ref 3.5–5.2)
Alkaline Phosphatase: 46 U/L (ref 39–117)
BUN: 14 mg/dL (ref 6–23)
CO2: 25 mEq/L (ref 19–32)
Calcium: 10.5 mg/dL (ref 8.4–10.5)
Chloride: 105 mEq/L (ref 96–112)
Creatinine, Ser: 0.87 mg/dL (ref 0.40–1.20)
GFR: 62.87 mL/min (ref >60.00)
Glucose, Bld: 137 mg/dL — ABNORMAL HIGH (ref 70–99)
Potassium: 4.1 mEq/L (ref 3.5–5.1)
Sodium: 139 mEq/L (ref 135–145)
Total Bilirubin: 0.5 mg/dL (ref 0.2–1.2)
Total Protein: 7.4 g/dL (ref 6.0–8.3)

## 2018-08-21 LAB — FERRITIN: Ferritin: 43.3 ng/mL (ref 10.0–291.0)

## 2018-08-21 LAB — TSH: TSH: 1.06 u[IU]/mL (ref 0.35–4.50)

## 2018-08-21 NOTE — Patient Instructions (Addendum)
It was good to see you today- I will be in touch with your labs asap We will try and determine any cause for your fatigue I am concerned that NOT treating your blood pressure will increase your risk of a heart attack or stroke.   Please try going back on your amlodipine and see if you may tolerate this on its own  Please see me in one month to check on how you are doing

## 2018-09-11 DIAGNOSIS — Z803 Family history of malignant neoplasm of breast: Secondary | ICD-10-CM | POA: Diagnosis not present

## 2018-09-11 DIAGNOSIS — Z1231 Encounter for screening mammogram for malignant neoplasm of breast: Secondary | ICD-10-CM | POA: Diagnosis not present

## 2018-09-11 LAB — HM MAMMOGRAPHY

## 2018-09-11 NOTE — Progress Notes (Signed)
[place] at [facility] [address], [place], [place] [address] [phone]  Date:  09/18/2018   Name:  Wanda [name]   DOB:  [birth year]   MRN:  [id number]  PCP:  Darreld Mclean, MD    Chief Complaint: Diabetes (1 month follow up) and Flu Vaccine (declines flu shot)   History of Present Illness:  Wanda [name] is a 78 y.o. very pleasant female patient who presents with the following:  Here today for a short term follow-up visit- history of HTN, IDDM, hyperlipidemai, goiter We had a visit in August: Following up today on a few concerns Check A1c, she is currently taking insulin and glipizide Lipids pending She has noticed fatigue, check TSH and ferritin We have had some difficulty treating her blood pressure, as she is allergic to ACE inhibitor's, and has now stopped a beta-blocker, diuretic, and CCB due to possible intolerance Explained that as she stopped her hydrochlorothiazide and amlodipine at the same time, it is hard to know which medicine might have actually caused her side effects.  I have asked her to please start back up back on amlodipine on its own, and see if she may tolerate this She agrees, will see me in 1 month for recheck  Lab Results  Component Value Date   HGBA1C 8.5 (H) 08/21/2018   I sent her the following lab message with last labs but she did not reply: Your A1c is a bit better although still above goal.  I was thinking of adding another oral medication which may also assist with weight loss.  Is this something you might consider?   Also, your lipids are too high.  I know that you did not tolerate lipitor in the past.  Would you be willing to try a different or lower dose statin drug? Please let me know and take care See you in one month to check on your blood pressure!   Declines a flu shot, pneumonia vaccine, or tetanus booster.  She states that since she had an adverse reaction to ?valtrex she does  not want to have routine immunizations.   I have explained to her that there is no connection between these two things   mammo was done recently  Need to complete form for diabetes testing supplies today - taken care of with pt input She notes that she took "my BP meds" for a week and then stopped due to her blood sugar going up She was taking both HCTZ and amlodipine I had wanted her to just go on amlodipine but she did not want to do this due to swelling so was taking both. She then stopped both BP meds and feels like her sugar went much lower- in fact she held her lantus a couple of times as her evening sugar was 130  She is checking her BP at home some of the time- it does look fine today  Highest 157/80 Otherwise generally 140s/70  She is taking glipizide and lantus  She uses about 16 units of lantus daily   She has not changed her diet or exercise routine   Wt Readings from Last 3 Encounters:  09/18/18 179 lb (81.2 kg)  08/21/18 181 lb (82.1 kg)  07/02/18 181 lb (82.1 kg)   Weight is stable No CP or SOB  No fever or chills   Patient Active Problem List   Diagnosis Date Noted  . Primary osteoarthritis of left knee 10/04/2015  .  Osteoporosis 08/23/2015  . Multinodular goiter (nontoxic) 04/07/2014  . Diabetes mellitus type 2, uncontrolled, without complications ([facility]) 01/00/7121  . Insulin adverse reaction 09/27/2013  . Senile nuclear sclerosis 08/14/2012  . HTN, goal below 130/80 03/10/2011  . Hyperlipidemia with target LDL less than 70 03/10/2011  . Obesity (BMI 30.0-34.9) 03/10/2011    Past Medical History:  Diagnosis Date  . Arthritis   . Cataract   . Diabetes mellitus   . Hypertension   . Skin cancer    Removed from face  . Urgency of urination   . Urinary leakage     Past Surgical History:  Procedure Laterality Date  . ABDOMINAL HYSTERECTOMY  early 80's   total  . CARPAL TUNNEL RELEASE Bilateral 1980 and 1981   both hands  . EYE SURGERY     bilateral  cataracts  . Fatty Tumor Excision    . JOINT REPLACEMENT    . TONSILLECTOMY    . TONSILLECTOMY AND ADENOIDECTOMY  age 32  . TOTAL KNEE ARTHROPLASTY Right 10/04/2015  . TOTAL KNEE ARTHROPLASTY Right 10/04/2015   Procedure: TOTAL KNEE ARTHROPLASTY;  Surgeon: Dorna Leitz, MD;  Location: [facility];  Service: Orthopedics;  Laterality: Right;  . TOTAL KNEE ARTHROPLASTY Left 01/28/2016   Procedure: TOTAL KNEE ARTHROPLASTY;  Surgeon: Dorna Leitz, MD;  Location: [facility];  Service: Orthopedics;  Laterality: Left;  . TUBAL LIGATION      Social History   Tobacco Use  . Smoking status: Never Smoker  . Smokeless tobacco: Never Used  Substance Use Topics  . Alcohol use: Yes    Alcohol/week: 1.0 standard drinks    Types: 1 Standard drinks or equivalent per week    Comment: maybe once per month  . Drug use: No    Family History  Problem Relation Age of Onset  . Pancreatitis Father        deceased 70  . Cancer Other   . Cancer Brother        GI  . Cancer Mother        liver  . Cancer Son        terminal kidney    Allergies  Allergen Reactions  . Ace Inhibitors Swelling and Cough    Pt had cough and diarrhea with first few doses of medication; also had swelling of right eyelid. Stopped medication on 03/17/11.  . Alendronate Sodium Other (See Comments)    Whole body hurt  . Aspirin Hives  . Lipitor [Atorvastatin Calcium] Other (See Comments)    Whole body hurt  . Metformin And Related     Severe abdominal pain  . Shellfish Allergy     Intolerant of fresh shellfish, reports the reaction is GI upset, denies hives, denies any swelling  Reports that she can tolerate canned seafood.     Medication list has been reviewed and updated.  Current Outpatient Medications on File Prior to Visit  Medication Sig Dispense Refill  . acetaminophen (TYLENOL) 325 MG tablet Take 650 mg by mouth every 6 (six) hours as needed for mild pain.    [place] amLODipine (NORVASC) 10 MG tablet Take 1 tablet (10 mg total) by  mouth daily. 90 tablet 3  . blood glucose meter kit and supplies Dispense based on patient and insurance preference. Pt just needs meter 1 each 0  . Blood Glucose Monitoring Suppl (ONE TOUCH ULTRA MINI) w/Device KIT Use to test blood sugar daily as instructed. Dx: E11.65 1 each 0  . glipiZIDE (GLUCOTROL XL) 10  MG 24 hr tablet Take 1 tablet (10 mg total) by mouth daily with breakfast. 90 tablet 3  . glucose blood (ONE TOUCH ULTRA TEST) test strip Test blood sugar 3 times a day. Dx code: 250.00 100 each 12  . glucose blood test strip Test blood sugar 3 times a day. Dx code: 250.00 100 each 12  . hydrochlorothiazide (HYDRODIURIL) 25 MG tablet Take 1 tablet (25 mg total) by mouth daily. 90 tablet 3  . Insulin Glargine (LANTUS SOLOSTAR) 100 UNIT/ML Solostar Pen Inject 15-20 Units into the skin daily. 5 pen 11  . Insulin Pen Needle (BD PEN NEEDLE NANO U/F) 32G X 4 MM MISC USE TO INJECT INSULIN ONCE DAILY 100 each 6  . Insulin Syringe-Needle U-100 (INSULIN SYRINGE .5CC/31GX5/16") 31G X 5/16" 0.5 ML MISC Use to inject insulin 1 time daily. 90 each 3  . niacin (NIASPAN) 500 MG CR tablet Take 1 tablet (500 mg total) by mouth at bedtime. Increase to 2 tablets after 1 month (Patient taking differently: Take 500 mg by mouth daily. ) 180 tablet 2  . vitamin E 200 UNIT capsule Take 200 Units by mouth daily. Reported on 05/31/2015    . [DISCONTINUED] lisinopril (PRINIVIL,ZESTRIL) 20 MG tablet Take 20 mg by mouth daily.      No current facility-administered medications on file prior to visit.     Review of Systems:  As per HPI- otherwise negative.   Physical Examination: Vitals:   09/18/18 1301  BP: (!) 142/80  Pulse: 96  Resp: 16  Temp: (!) 96.6 F (35.9 C)  SpO2: 97%   Vitals:   09/18/18 1301  Weight: 179 lb (81.2 kg)  Height: 5' 1"  (1.549 m)   Body mass index is 33.82 kg/m. Ideal Body Weight: Weight in (lb) to have BMI = 25: 132  GEN: WDWN, NAD, Non-toxic, A & O x 3, obese, looks well   HEENT: Atraumatic, Normocephalic. Neck supple. No masses, No LAD. Ears and Nose: No external deformity. CV: RRR, No M/G/R. No JVD. No thrill. No extra heart sounds. PULM: CTA B, no wheezes, crackles, rhonchi. No retractions. No resp. distress. No accessory muscle use. EXTR: No c/c/e NEURO Normal gait.  PSYCH: Normally interactive. Conversant. Not depressed or anxious appearing.  Calm demeanor.   BP Readings from Last 3 Encounters:  09/18/18 (!) 142/80  08/21/18 (!) 184/86  07/02/18 (!) 168/83    Assessment and Plan:   ICD-10-CM   1. Essential hypertension  I10   2. Hyperlipidemia with target LDL less than 70  E78.5   3. Diabetes mellitus type 2, uncontrolled, without complications (HCC)  J19.41 Hemoglobin A1c  4. Age-related osteoporosis without current pathological fracture  M81.0   5. Immunization due  Z23    Following up on her BP today She was taking both amlodipine and hctz for a time, then stopped both due to ?elevated glucose. Today her BP does look ok Asked her to continue to monitor- if going up try 5 mg of amlodipine and alert me  She has not been interested in treatment for osteopenia  Ordered an A1c for her to have done in 2 months Declines routine labs today     Follow-up: No follow-ups on file.  No orders of the defined types were placed in this encounter.  Orders Placed This Encounter  Procedures  . Hemoglobin A1c    @SIGN @    Signed Lamar Blinks, MD

## 2018-09-13 ENCOUNTER — Encounter: Payer: Self-pay | Admitting: Family Medicine

## 2018-09-17 ENCOUNTER — Other Ambulatory Visit: Payer: Self-pay | Admitting: Family Medicine

## 2018-09-17 DIAGNOSIS — IMO0001 Reserved for inherently not codable concepts without codable children: Secondary | ICD-10-CM

## 2018-09-18 ENCOUNTER — Ambulatory Visit (INDEPENDENT_AMBULATORY_CARE_PROVIDER_SITE_OTHER): Payer: Medicare Other | Admitting: Family Medicine

## 2018-09-18 ENCOUNTER — Other Ambulatory Visit: Payer: Self-pay

## 2018-09-18 ENCOUNTER — Encounter: Payer: Self-pay | Admitting: Family Medicine

## 2018-09-18 VITALS — BP 142/80 | HR 96 | Temp 96.6°F | Resp 16 | Ht 61.0 in | Wt 179.0 lb

## 2018-09-18 DIAGNOSIS — M810 Age-related osteoporosis without current pathological fracture: Secondary | ICD-10-CM

## 2018-09-18 DIAGNOSIS — I10 Essential (primary) hypertension: Secondary | ICD-10-CM | POA: Diagnosis not present

## 2018-09-18 DIAGNOSIS — IMO0001 Reserved for inherently not codable concepts without codable children: Secondary | ICD-10-CM

## 2018-09-18 DIAGNOSIS — E785 Hyperlipidemia, unspecified: Secondary | ICD-10-CM

## 2018-09-18 DIAGNOSIS — Z23 Encounter for immunization: Secondary | ICD-10-CM

## 2018-09-18 DIAGNOSIS — E1165 Type 2 diabetes mellitus with hyperglycemia: Secondary | ICD-10-CM

## 2018-09-18 NOTE — Patient Instructions (Signed)
It was good to see you again today Please schedule a lab visit only in mid November and we will recheck your A1c Your BP looks good today If you start running much higher than 140/90 please start back on 5 mg of amlodipine

## 2018-09-19 ENCOUNTER — Emergency Department (HOSPITAL_COMMUNITY): Payer: Medicare Other

## 2018-09-19 ENCOUNTER — Inpatient Hospital Stay (HOSPITAL_COMMUNITY): Payer: Medicare Other

## 2018-09-19 ENCOUNTER — Inpatient Hospital Stay (HOSPITAL_COMMUNITY)
Admission: EM | Admit: 2018-09-19 | Discharge: 2018-09-23 | DRG: 066 | Disposition: A | Discharge status: Home / Self Care | Payer: Medicare Other | Attending: Internal Medicine | Admitting: Internal Medicine

## 2018-09-19 ENCOUNTER — Other Ambulatory Visit: Payer: Self-pay

## 2018-09-19 DIAGNOSIS — Z03818 Encounter for observation for suspected exposure to other biological agents ruled out: Secondary | ICD-10-CM | POA: Diagnosis not present

## 2018-09-19 DIAGNOSIS — Z6833 Body mass index (BMI) 33.0-33.9, adult: Secondary | ICD-10-CM | POA: Diagnosis not present

## 2018-09-19 DIAGNOSIS — I6523 Occlusion and stenosis of bilateral carotid arteries: Secondary | ICD-10-CM | POA: Diagnosis present

## 2018-09-19 DIAGNOSIS — I639 Cerebral infarction, unspecified: Secondary | ICD-10-CM

## 2018-09-19 DIAGNOSIS — Z888 Allergy status to other drugs, medicaments and biological substances status: Secondary | ICD-10-CM | POA: Diagnosis not present

## 2018-09-19 DIAGNOSIS — Z7289 Other problems related to lifestyle: Secondary | ICD-10-CM

## 2018-09-19 DIAGNOSIS — I6381 Other cerebral infarction due to occlusion or stenosis of small artery: Secondary | ICD-10-CM | POA: Diagnosis not present

## 2018-09-19 DIAGNOSIS — I708 Atherosclerosis of other arteries: Secondary | ICD-10-CM | POA: Diagnosis present

## 2018-09-19 DIAGNOSIS — E785 Hyperlipidemia, unspecified: Secondary | ICD-10-CM | POA: Diagnosis present

## 2018-09-19 DIAGNOSIS — R29810 Facial weakness: Secondary | ICD-10-CM | POA: Diagnosis present

## 2018-09-19 DIAGNOSIS — E049 Nontoxic goiter, unspecified: Secondary | ICD-10-CM | POA: Diagnosis present

## 2018-09-19 DIAGNOSIS — Z886 Allergy status to analgesic agent status: Secondary | ICD-10-CM

## 2018-09-19 DIAGNOSIS — I6389 Other cerebral infarction: Secondary | ICD-10-CM | POA: Insufficient documentation

## 2018-09-19 DIAGNOSIS — Z96653 Presence of artificial knee joint, bilateral: Secondary | ICD-10-CM | POA: Diagnosis present

## 2018-09-19 DIAGNOSIS — Z9114 Patient's other noncompliance with medication regimen: Secondary | ICD-10-CM | POA: Diagnosis not present

## 2018-09-19 DIAGNOSIS — R9082 White matter disease, unspecified: Secondary | ICD-10-CM | POA: Diagnosis not present

## 2018-09-19 DIAGNOSIS — Z85828 Personal history of other malignant neoplasm of skin: Secondary | ICD-10-CM

## 2018-09-19 DIAGNOSIS — Z20828 Contact with and (suspected) exposure to other viral communicable diseases: Secondary | ICD-10-CM | POA: Diagnosis present

## 2018-09-19 DIAGNOSIS — I63233 Cerebral infarction due to unspecified occlusion or stenosis of bilateral carotid arteries: Secondary | ICD-10-CM | POA: Diagnosis not present

## 2018-09-19 DIAGNOSIS — I10 Essential (primary) hypertension: Secondary | ICD-10-CM | POA: Diagnosis present

## 2018-09-19 DIAGNOSIS — T17920A Food in respiratory tract, part unspecified causing asphyxiation, initial encounter: Secondary | ICD-10-CM | POA: Diagnosis not present

## 2018-09-19 DIAGNOSIS — E669 Obesity, unspecified: Secondary | ICD-10-CM | POA: Diagnosis present

## 2018-09-19 DIAGNOSIS — R531 Weakness: Secondary | ICD-10-CM | POA: Diagnosis not present

## 2018-09-19 DIAGNOSIS — E1165 Type 2 diabetes mellitus with hyperglycemia: Secondary | ICD-10-CM | POA: Diagnosis not present

## 2018-09-19 DIAGNOSIS — I63541 Cerebral infarction due to unspecified occlusion or stenosis of right cerebellar artery: Secondary | ICD-10-CM | POA: Diagnosis not present

## 2018-09-19 DIAGNOSIS — Z794 Long term (current) use of insulin: Secondary | ICD-10-CM | POA: Diagnosis not present

## 2018-09-19 DIAGNOSIS — Z9119 Patient's noncompliance with other medical treatment and regimen: Secondary | ICD-10-CM

## 2018-09-19 DIAGNOSIS — M810 Age-related osteoporosis without current pathological fracture: Secondary | ICD-10-CM | POA: Diagnosis present

## 2018-09-19 DIAGNOSIS — Z91013 Allergy to seafood: Secondary | ICD-10-CM | POA: Diagnosis not present

## 2018-09-19 DIAGNOSIS — R1310 Dysphagia, unspecified: Secondary | ICD-10-CM | POA: Diagnosis present

## 2018-09-19 DIAGNOSIS — Z79899 Other long term (current) drug therapy: Secondary | ICD-10-CM | POA: Diagnosis not present

## 2018-09-19 DIAGNOSIS — Z90710 Acquired absence of both cervix and uterus: Secondary | ICD-10-CM | POA: Diagnosis not present

## 2018-09-19 DIAGNOSIS — R1311 Dysphagia, oral phase: Secondary | ICD-10-CM | POA: Diagnosis not present

## 2018-09-19 DIAGNOSIS — I6302 Cerebral infarction due to thrombosis of basilar artery: Secondary | ICD-10-CM | POA: Diagnosis not present

## 2018-09-19 DIAGNOSIS — E1169 Type 2 diabetes mellitus with other specified complication: Secondary | ICD-10-CM | POA: Diagnosis not present

## 2018-09-19 DIAGNOSIS — Z713 Dietary counseling and surveillance: Secondary | ICD-10-CM

## 2018-09-19 HISTORY — DX: Cerebral infarction, unspecified: I63.9

## 2018-09-19 LAB — COMPREHENSIVE METABOLIC PANEL
ALT: 16 U/L (ref 0–44)
AST: 18 U/L (ref 15–41)
Albumin: 4.2 g/dL (ref 3.5–5.0)
Alkaline Phosphatase: 44 U/L (ref 38–126)
Anion gap: 10 (ref 5–15)
BUN: 11 mg/dL (ref 8–23)
CO2: 23 mmol/L (ref 22–32)
Calcium: 9.8 mg/dL (ref 8.9–10.3)
Chloride: 106 mmol/L (ref 98–111)
Creatinine, Ser: 0.77 mg/dL (ref 0.44–1.00)
GFR calc Af Amer: >60 mL/min (ref >60)
GFR calc non Af Amer: >60 mL/min (ref >60)
Glucose, Bld: 216 mg/dL — ABNORMAL HIGH (ref 70–99)
Potassium: 4 mmol/L (ref 3.5–5.1)
Sodium: 139 mmol/L (ref 135–145)
Total Bilirubin: 0.6 mg/dL (ref 0.3–1.2)
Total Protein: 7.3 g/dL (ref 6.5–8.1)

## 2018-09-19 LAB — URINALYSIS, ROUTINE W REFLEX MICROSCOPIC
Bilirubin Urine: NEGATIVE
Glucose, UA: NEGATIVE mg/dL
Hgb urine dipstick: NEGATIVE
Ketones, ur: NEGATIVE mg/dL
Leukocytes,Ua: NEGATIVE
Nitrite: NEGATIVE
Protein, ur: NEGATIVE mg/dL
Specific Gravity, Urine: 1.01 (ref 1.005–1.030)
pH: 6 (ref 5.0–8.0)

## 2018-09-19 LAB — I-STAT CHEM 8, ED
BUN: 13 mg/dL (ref 8–23)
Calcium, Ion: 1.25 mmol/L (ref 1.15–1.40)
Chloride: 108 mmol/L (ref 98–111)
Creatinine, Ser: 0.7 mg/dL (ref 0.44–1.00)
Glucose, Bld: 212 mg/dL — ABNORMAL HIGH (ref 70–99)
HCT: 42 % (ref 36.0–46.0)
Hemoglobin: 14.3 g/dL (ref 12.0–15.0)
Potassium: 4 mmol/L (ref 3.5–5.1)
Sodium: 142 mmol/L (ref 135–145)
TCO2: 23 mmol/L (ref 22–32)

## 2018-09-19 LAB — CBC
HCT: 41.8 % (ref 36.0–46.0)
Hemoglobin: 14 g/dL (ref 12.0–15.0)
MCH: 30.1 pg (ref 26.0–34.0)
MCHC: 33.5 g/dL (ref 30.0–36.0)
MCV: 89.9 fL (ref 80.0–100.0)
Platelets: 238 10*3/uL (ref 150–400)
RBC: 4.65 MIL/uL (ref 3.87–5.11)
RDW: 12.9 % (ref 11.5–15.5)
WBC: 5.6 10*3/uL (ref 4.0–10.5)
nRBC: 0 % (ref 0.0–0.2)

## 2018-09-19 LAB — DIFFERENTIAL
Abs Immature Granulocytes: 0.02 10*3/uL (ref 0.00–0.07)
Basophils Absolute: 0 10*3/uL (ref 0.0–0.1)
Basophils Relative: 1 %
Eosinophils Absolute: 0.1 10*3/uL (ref 0.0–0.5)
Eosinophils Relative: 1 %
Immature Granulocytes: 0 %
Lymphocytes Relative: 19 %
Lymphs Abs: 1.1 10*3/uL (ref 0.7–4.0)
Monocytes Absolute: 0.5 10*3/uL (ref 0.1–1.0)
Monocytes Relative: 9 %
Neutro Abs: 3.9 10*3/uL (ref 1.7–7.7)
Neutrophils Relative %: 70 %

## 2018-09-19 LAB — RAPID URINE DRUG SCREEN, HOSP PERFORMED
Amphetamines: NOT DETECTED
Barbiturates: NOT DETECTED
Benzodiazepines: NOT DETECTED
Cocaine: NOT DETECTED
Opiates: NOT DETECTED
Tetrahydrocannabinol: NOT DETECTED

## 2018-09-19 LAB — CBG MONITORING, ED: Glucose-Capillary: 180 mg/dL — ABNORMAL HIGH (ref 70–99)

## 2018-09-19 LAB — GLUCOSE, CAPILLARY
Glucose-Capillary: 164 mg/dL — ABNORMAL HIGH (ref 70–99)
Glucose-Capillary: 138 mg/dL — ABNORMAL HIGH (ref 70–99)

## 2018-09-19 LAB — PROTIME-INR
INR: 1 (ref 0.8–1.2)
Prothrombin Time: 12.5 seconds (ref 11.4–15.2)

## 2018-09-19 LAB — APTT: aPTT: 26 seconds (ref 24–36)

## 2018-09-19 LAB — ETHANOL: Alcohol, Ethyl (B): <10 mg/dL (ref <10)

## 2018-09-19 MED ORDER — INSULIN ASPART 100 UNIT/ML ~~LOC~~ SOLN: 0.0000 [IU] | Freq: Every day | SUBCUTANEOUS | Status: DC

## 2018-09-19 MED ORDER — ASPIRIN 300 MG RE SUPP: 300.0000 mg | Freq: Every day | RECTAL | Status: DC

## 2018-09-19 MED ORDER — ENOXAPARIN SODIUM 40 MG/0.4ML ~~LOC~~ SOLN
40.0000 mg | SUBCUTANEOUS | Status: DC
  Administered 2018-09-19 – 2018-09-22 (×4): 40 mg via SUBCUTANEOUS
  Filled 2018-09-19 (×4): qty 0.4

## 2018-09-19 MED ORDER — CHLORHEXIDINE GLUCONATE CLOTH 2 % EX PADS
6.0000 | MEDICATED_PAD | Freq: Every day | CUTANEOUS | Status: DC
  Administered 2018-09-20: 12:00:00 6 via TOPICAL

## 2018-09-19 MED ORDER — ACETAMINOPHEN 325 MG PO TABS: 650.0000 mg | ORAL_TABLET | ORAL | Status: DC | PRN

## 2018-09-19 MED ORDER — ACETAMINOPHEN 160 MG/5ML PO SOLN: 650.0000 mg | ORAL | Status: DC | PRN

## 2018-09-19 MED ORDER — ACETAMINOPHEN 650 MG RE SUPP: 650.0000 mg | RECTAL | Status: DC | PRN

## 2018-09-19 MED ORDER — SODIUM CHLORIDE 0.45 % IV SOLN
INTRAVENOUS | Status: DC
  Administered 2018-09-19 – 2018-09-23 (×4): via INTRAVENOUS

## 2018-09-19 MED ORDER — STROKE: EARLY STAGES OF RECOVERY BOOK
Freq: Once | Status: DC
  Filled 2018-09-19 (×2): qty 1

## 2018-09-19 MED ORDER — INSULIN ASPART 100 UNIT/ML ~~LOC~~ SOLN
0.0000 [IU] | Freq: Three times a day (TID) | SUBCUTANEOUS | Status: DC
  Administered 2018-09-19 – 2018-09-20 (×2): 2 [IU] via SUBCUTANEOUS
  Administered 2018-09-20: 1 [IU] via SUBCUTANEOUS

## 2018-09-19 NOTE — H&P (Addendum)
[facility]                                                                                    Patient Demographics  Wanda [name], is a 78 y.o. female  CSN: [id number]  MRN: [id number]  DOB - 1940/11/29  Admit Date - 09/19/2018  Outpatient Primary MD for the patient is Copland, Gay Filler, MD   With History of -  Past Medical History:  Diagnosis Date  . Arthritis   . Cataract   . Diabetes mellitus   . Hypertension   . Skin cancer    Removed from face  . Urgency of urination   . Urinary leakage       Past Surgical History:  Procedure Laterality Date  . ABDOMINAL HYSTERECTOMY  early 80's   total  . CARPAL TUNNEL RELEASE Bilateral 1980 and 1981   both hands  . EYE SURGERY     bilateral cataracts  . Fatty Tumor Excision    . JOINT REPLACEMENT    . TONSILLECTOMY    . TONSILLECTOMY AND ADENOIDECTOMY  age 36  . TOTAL KNEE ARTHROPLASTY Right 10/04/2015  . TOTAL KNEE ARTHROPLASTY Right 10/04/2015   Procedure: TOTAL KNEE ARTHROPLASTY;  Surgeon: Dorna Leitz, MD;  Location: [place];  Service: Orthopedics;  Laterality: Right;  . TOTAL KNEE ARTHROPLASTY Left 01/28/2016   Procedure: TOTAL KNEE ARTHROPLASTY;  Surgeon: Dorna Leitz, MD;  Location: [place];  Service: Orthopedics;  Laterality: Left;  . TUBAL LIGATION      in for   Chief Complaint  Patient presents with  . Dysphagia     HPI  Wanda [name]  is a 78 y.o. female, with past medical history significant for diabetes mellitus and hypertension, noncompliant with her blood pressure medications presenting with 2 days history of problems swallowing and choking.  No headaches, shortness of breath, nausea , vomiting or diarrhea .   Patient admits that she has not taken her blood pressure medicine for the last couple of months due to side effects and did not follow-up with her PMD.  Patient also reports that she has been having problems with right arm paresthesias and decreased sensations In the emergency room MRI  showed acute small left medullary lacunar infarct.  Blood work was normal. Neurology was consulted. Patient did not pass her swallow test and she is going to be kept n.p.o.  IV fluids will be started.     Review of Systems    In addition to the HPI above,  No Fever-chills, No Headache, No changes with Vision or hearing, No Chest pain, Cough or Shortness of Breath, No Abdominal pain, No Nausea or Vommitting, Bowel movements are regular, No Blood in stool or Urine, No dysuria, No new skin rashes or bruises, No new joints pains-aches,  No new weakness, No recent weight gain or loss, No polyuria, polydypsia or polyphagia, No significant Mental Stressors.  A full 10 point Review of Systems was done, except as stated above, all other Review of Systems were negative.   Social History Social History   Tobacco Use  . Smoking status: Never Smoker  . Smokeless tobacco: Never Used  Substance Use  Topics  . Alcohol use: Yes    Alcohol/week: 1.0 standard drinks    Types: 1 Standard drinks or equivalent per week    Comment: maybe once per month     Family History Family History  Problem Relation Age of Onset  . Pancreatitis Father        deceased 22  . Cancer Other   . Cancer Brother        GI  . Cancer Mother        liver  . Cancer Son        terminal kidney     Prior to Admission medications   Medication Sig Start Date End Date Taking? Authorizing Provider  glipiZIDE (GLUCOTROL XL) 10 MG 24 hr tablet TAKE 1 TABLET(10 MG) BY MOUTH DAILY WITH BREAKFAST Patient taking differently: Take 10 mg by mouth daily with breakfast.  09/18/18  Yes Copland, Gay Filler, MD  Insulin Glargine (LANTUS SOLOSTAR) 100 UNIT/ML Solostar Pen Inject 15-20 Units into the skin daily. Patient taking differently: Inject 11-20 Units into the skin daily.  03/26/18  Yes Copland, Gay Filler, MD  niacin (NIASPAN) 500 MG CR tablet Take 1 tablet (500 mg total) by mouth at bedtime. Increase to 2 tablets after 1  month Patient taking differently: Take 500 mg by mouth daily.  04/19/15  Yes Copland, Gay Filler, MD  vitamin E 200 UNIT capsule Take 200 Units by mouth daily. Reported on 05/31/2015   Yes [provider]  amLODipine (NORVASC) 10 MG tablet Take 1 tablet (10 mg total) by mouth daily. Patient not taking: Reported on 09/19/2018 09/13/17   Copland, Gay Filler, MD  blood glucose meter kit and supplies Dispense based on patient and insurance preference. Pt just needs meter 02/26/17   Copland, Gay Filler, MD  Blood Glucose Monitoring Suppl (ONE TOUCH ULTRA MINI) w/Device KIT Use to test blood sugar daily as instructed. Dx: E11.65 03/01/17   Copland, Gay Filler, MD  glucose blood (ONE TOUCH ULTRA TEST) test strip Test blood sugar 3 times a day. Dx code: 250.00 08/06/18   Copland, Gay Filler, MD  glucose blood test strip Test blood sugar 3 times a day. Dx code: 54.00 Patient not taking: Reported on 09/19/2018 03/02/16   Copland, Gay Filler, MD  hydrochlorothiazide (HYDRODIURIL) 25 MG tablet Take 1 tablet (25 mg total) by mouth daily. Patient not taking: Reported on 09/19/2018 09/13/17   Copland, Gay Filler, MD  Insulin Pen Needle (BD PEN NEEDLE NANO U/F) 32G X 4 MM MISC USE TO INJECT INSULIN ONCE DAILY 08/31/16   Copland, Gay Filler, MD  Insulin Syringe-Needle U-100 (INSULIN SYRINGE .5CC/31GX5/16") 31G X 5/16" 0.5 ML MISC Use to inject insulin 1 time daily. 09/23/14   Philemon Kingdom, MD  lisinopril (PRINIVIL,ZESTRIL) 20 MG tablet Take 20 mg by mouth daily.   03/23/11  [provider]    Allergies  Allergen Reactions  . Ace Inhibitors Swelling and Cough    Pt had cough and diarrhea with first few doses of medication; also had swelling of right eyelid. Stopped medication on 03/17/11.  . Alendronate Sodium Other (See Comments)    Whole body hurt  . Aspirin Hives  . Lipitor [Atorvastatin Calcium] Other (See Comments)    Whole body hurt  . Metformin And Related     Severe abdominal pain  . Shellfish  Allergy     Intolerant of fresh shellfish, reports the reaction is GI upset, denies hives, denies any swelling  Reports that she can tolerate  canned seafood.     Physical Exam  Vitals  Blood pressure (!) 183/86, pulse 91, temperature 98 F (36.7 C), temperature source Oral, resp. rate 19, height 5' 1"  (1.549 m), weight 80.7 kg, SpO2 100 %.  General appearance, no acute distress, extremely pleasant, well-developed, well-nourished HEENT no jaundice or pallor, no facial deviation oral thrush Neck supple, no neck vein distention Chest clear and resonant Heart normal S1-S2 Abdomen soft, nontender Extremities no clubbing cyanosis or edema with decreased sensations in the right arm Skin no rashes or ulcers   Data Review  CBC Recent Labs  Lab 09/19/18 0739 09/19/18 0747  WBC 5.6  --   HGB 14.0 14.3  HCT 41.8 42.0  PLT 238  --   MCV 89.9  --   MCH 30.1  --   MCHC 33.5  --   RDW 12.9  --   LYMPHSABS 1.1  --   MONOABS 0.5  --   EOSABS 0.1  --   BASOSABS 0.0  --    ------------------------------------------------------------------------------------------------------------------  Chemistries  Recent Labs  Lab 09/19/18 0739 09/19/18 0747  NA 139 142  K 4.0 4.0  CL 106 108  CO2 23  --   GLUCOSE 216* 212*  BUN 11 13  CREATININE 0.77 0.70  CALCIUM 9.8  --   AST 18  --   ALT 16  --   ALKPHOS 44  --   BILITOT 0.6  --    ------------------------------------------------------------------------------------------------------------------ estimated creatinine clearance is 55.8 mL/min (by C-G formula based on SCr of 0.7 mg/dL). ------------------------------------------------------------------------------------------------------------------ No results for input(s): TSH, T4TOTAL, T3FREE, THYROIDAB in the last 72 hours.  Invalid input(s): FREET3   Coagulation profile Recent Labs  Lab 09/19/18 0739  INR 1.0    ------------------------------------------------------------------------------------------------------------------- No results for input(s): DDIMER in the last 72 hours. -------------------------------------------------------------------------------------------------------------------  Cardiac Enzymes No results for input(s): CKMB, TROPONINI, MYOGLOBIN in the last 168 hours.  Invalid input(s): CK ------------------------------------------------------------------------------------------------------------------ Invalid input(s): POCBNP   ---------------------------------------------------------------------------------------------------------------  Urinalysis    Component Value Date/Time   COLORURINE YELLOW 09/19/2018 0758   APPEARANCEUR CLEAR 09/19/2018 0758   LABSPEC 1.010 09/19/2018 0758   PHURINE 6.0 09/19/2018 0758   GLUCOSEU NEGATIVE 09/19/2018 0758   HGBUR NEGATIVE 09/19/2018 0758   BILIRUBINUR NEGATIVE 09/19/2018 0758   BILIRUBINUR neg 07/18/2012 0938   KETONESUR NEGATIVE 09/19/2018 0758   PROTEINUR NEGATIVE 09/19/2018 0758   UROBILINOGEN 0.2 07/18/2012 0938   NITRITE NEGATIVE 09/19/2018 0758   LEUKOCYTESUR NEGATIVE 09/19/2018 0758    ----------------------------------------------------------------------------------------------------------------   Imaging results:   Ct Head Wo Contrast  Result Date: 09/19/2018 CLINICAL DATA:  78 year old female with difficulty swallowing since last night. EXAM: CT HEAD WITHOUT CONTRAST TECHNIQUE: Contiguous axial images were obtained from the base of the skull through the vertex without intravenous contrast. COMPARISON:  None. FINDINGS: Brain: Cerebral volume is within normal limits for age. There is Patchy and confluent cerebral white matter hypodensity, greater in the left hemisphere. Hypodensity in the left centrum semiovale on series 3, image 24 most resembles a chronic white matter lacune. Gray-white matter differentiation in the  deep gray nuclei, brainstem and cerebellum appears normal. No midline shift, ventriculomegaly, mass effect, evidence of mass lesion, intracranial hemorrhage or evidence of cortically based acute infarction. Vascular: Calcified atherosclerosis at the skull base. No suspicious intracranial vascular hyperdensity. Skull: Negative. Sinuses/Orbits: Visualized paranasal sinuses and mastoids are clear. Other: No acute orbit or scalp soft tissue findings. IMPRESSION: Evidence of cerebral white matter small vessel disease but no acute cortically based  infarct or acute intracranial hemorrhage identified. Electronically Signed   By: Genevie Ann M.D.   On: 09/19/2018 07:36   Mr Brain Wo Contrast  Result Date: 09/19/2018 CLINICAL DATA:  78 year old female with difficulty swallowing since last night and left side weakness. EXAM: MRI HEAD WITHOUT CONTRAST TECHNIQUE: Multiplanar, multiecho pulse sequences of the brain and surrounding structures were obtained without intravenous contrast. COMPARISON:  Head CT earlier today. FINDINGS: Brain: There is a small linear 5-6 millimeter infarct with restricted diffusion in the lateral medulla on series 3, image 13 and series 4, image 21. Subtle associated T2 hyperintensity. No hemorrhage or mass effect. No other restricted diffusion. No midline shift, mass effect, evidence of mass lesion, ventriculomegaly, extra-axial collection or acute intracranial hemorrhage. Cervicomedullary junction and pituitary are within normal limits. Patchy bilateral cerebral white matter T2 and FLAIR hyperintensity, which at the anterior left centrum semiovale on series 7, image 20 most resembles chronic lacunar infarction. No cortical encephalomalacia or chronic cerebral blood products identified. Negative deep gray nuclei. The cerebellum remains within normal limits. Vascular: Major intracranial vascular flow voids are preserved, the left vertebral artery appears dominant. Skull and upper cervical spine:  Negative for age visible cervical spine. Normal bone marrow signal. Sinuses/Orbits: Postoperative changes to both globes, otherwise negative orbits. Paranasal Visualized paranasal sinuses and mastoids are stable and well pneumatized. Other: Visible internal auditory structures appear normal. Scalp and face soft tissues appear negative. IMPRESSION: 1. Positive for a small left lateral medullary lacunar infarct. No associated hemorrhage or mass effect. 2. No other acute intracranial abnormality. Cerebral white matter signal changes suggesting chronic small vessel disease. Electronically Signed   By: Genevie Ann M.D.   On: 09/19/2018 12:46    My personal review of EKG: Rhythm NSR, Rate 88 bpm with minimal ST depressions in the lateral leads    Assessment & Plan  CVA/small left medullary lacunar infarct with resulting dysphagia Neurology on consult Rectal aspirin  Diabetes mellitus type 2 We will hold long acting insulins and Glucotrol Insulin sliding scale to continue Patient will be on IV fluids for now  Hypertension Noncompliant Permissive hypertension we will hold Norvasc  Hyperlipidemia Hold Niaspan, patient n.p.o.  DVT Prophylaxis Lovenox  AM Labs Ordered, also please review Full Orders  Family Communication: Left message to [name] her daughter  Code Status full  Disposition Plan:  Time spent in minutes : 42 minutes  Condition GUARDED   @SIGNATURE @

## 2018-09-19 NOTE — ED Triage Notes (Addendum)
Pt presents to ED from home BIB [facility]. Pt c/o difficulty that began last night. As well as decreased temperature sensation on her right side. Pt also reports beginning to feel unsteady the morning of 9/8 0900. EMS VS HTN. EMS CBG 223.

## 2018-09-19 NOTE — ED Provider Notes (Signed)
[facility] Provider Note   CSN: [id number] Arrival date & time: 09/19/18  5885     History   Chief Complaint Chief Complaint  Patient presents with  . Dysphagia    HPI Wanda [name] is a 78 y.o. female.     HPI  78 year old female presents with trouble swallowing.  Started last evening.  It did not start after eating or after a food bolus.  Feels like half of her swallowing mechanism is broken and she usually has to spit up or ends up choking on what ever she is drinking.  There is no headache, blurry vision, trouble breathing, trouble speaking, or weakness.  However she does feel like she is off balance that is new since this started.  She also noticed a temperature difference in her arms.  She states that she put her right arm under water to test the temperature and it felt hot despite the left arm feeling cold.  She feels the right arm was abnormal.  She also noticed she did not feel her deodorant when she put it under her right armpit this morning.  She has not taken her blood pressure medicine in the last couple months as she was taken off of it due to side effects and has not gone back on.  Past Medical History:  Diagnosis Date  . Arthritis   . Cataract   . Diabetes mellitus   . Hypertension   . Skin cancer    Removed from face  . Urgency of urination   . Urinary leakage     Patient Active Problem List   Diagnosis Date Noted  . Primary osteoarthritis of left knee 10/04/2015  . Osteoporosis 08/23/2015  . Multinodular goiter (nontoxic) 04/07/2014  . Diabetes mellitus type 2, uncontrolled, without complications ([place]) 02/77/4128  . Insulin adverse reaction 09/27/2013  . Senile nuclear sclerosis 08/14/2012  . HTN, goal below 130/80 03/10/2011  . Hyperlipidemia with target LDL less than 70 03/10/2011  . Obesity (BMI 30.0-34.9) 03/10/2011    Past Surgical History:  Procedure Laterality Date  . ABDOMINAL HYSTERECTOMY  early 80's    total  . CARPAL TUNNEL RELEASE Bilateral 1980 and 1981   both hands  . EYE SURGERY     bilateral cataracts  . Fatty Tumor Excision    . JOINT REPLACEMENT    . TONSILLECTOMY    . TONSILLECTOMY AND ADENOIDECTOMY  age 60  . TOTAL KNEE ARTHROPLASTY Right 10/04/2015  . TOTAL KNEE ARTHROPLASTY Right 10/04/2015   Procedure: TOTAL KNEE ARTHROPLASTY;  Surgeon: Dorna Leitz, MD;  Location: [place];  Service: Orthopedics;  Laterality: Right;  . TOTAL KNEE ARTHROPLASTY Left 01/28/2016   Procedure: TOTAL KNEE ARTHROPLASTY;  Surgeon: Dorna Leitz, MD;  Location: [facility];  Service: Orthopedics;  Laterality: Left;  . TUBAL LIGATION       OB History   No obstetric history on file.      Home Medications    Prior to Admission medications   Medication Sig Start Date End Date Taking? Authorizing Provider  glipiZIDE (GLUCOTROL XL) 10 MG 24 hr tablet TAKE 1 TABLET(10 MG) BY MOUTH DAILY WITH BREAKFAST Patient taking differently: Take 10 mg by mouth daily with breakfast.  09/18/18  Yes Copland, Gay Filler, MD  Insulin Glargine (LANTUS SOLOSTAR) 100 UNIT/ML Solostar Pen Inject 15-20 Units into the skin daily. Patient taking differently: Inject 11-20 Units into the skin daily.  03/26/18  Yes Copland, Gay Filler, MD  niacin (NIASPAN) 500 MG  CR tablet Take 1 tablet (500 mg total) by mouth at bedtime. Increase to 2 tablets after 1 month Patient taking differently: Take 500 mg by mouth daily.  04/19/15  Yes Copland, Gay Filler, MD  vitamin E 200 UNIT capsule Take 200 Units by mouth daily. Reported on 05/31/2015   Yes [provider]  amLODipine (NORVASC) 10 MG tablet Take 1 tablet (10 mg total) by mouth daily. Patient not taking: Reported on 09/19/2018 09/13/17   Copland, Gay Filler, MD  blood glucose meter kit and supplies Dispense based on patient and insurance preference. Pt just needs meter 02/26/17   Copland, Gay Filler, MD  Blood Glucose Monitoring Suppl (ONE TOUCH ULTRA MINI) w/Device KIT Use to test blood sugar  daily as instructed. Dx: E11.65 03/01/17   Copland, Gay Filler, MD  glucose blood (ONE TOUCH ULTRA TEST) test strip Test blood sugar 3 times a day. Dx code: 250.00 08/06/18   Copland, Gay Filler, MD  glucose blood test strip Test blood sugar 3 times a day. Dx code: 94.00 Patient not taking: Reported on 09/19/2018 03/02/16   Copland, Gay Filler, MD  hydrochlorothiazide (HYDRODIURIL) 25 MG tablet Take 1 tablet (25 mg total) by mouth daily. Patient not taking: Reported on 09/19/2018 09/13/17   Copland, Gay Filler, MD  Insulin Pen Needle (BD PEN NEEDLE NANO U/F) 32G X 4 MM MISC USE TO INJECT INSULIN ONCE DAILY 08/31/16   Copland, Gay Filler, MD  Insulin Syringe-Needle U-100 (INSULIN SYRINGE .5CC/31GX5/16") 31G X 5/16" 0.5 ML MISC Use to inject insulin 1 time daily. 09/23/14   Philemon Kingdom, MD  lisinopril (PRINIVIL,ZESTRIL) 20 MG tablet Take 20 mg by mouth daily.   03/23/11  [provider]    Family History Family History  Problem Relation Age of Onset  . Pancreatitis Father        deceased 84  . Cancer Other   . Cancer Brother        GI  . Cancer Mother        liver  . Cancer Son        terminal kidney    Social History Social History   Tobacco Use  . Smoking status: Never Smoker  . Smokeless tobacco: Never Used  Substance Use Topics  . Alcohol use: Yes    Alcohol/week: 1.0 standard drinks    Types: 1 Standard drinks or equivalent per week    Comment: maybe once per month  . Drug use: No     Allergies   Ace inhibitors, Alendronate sodium, Aspirin, Lipitor [atorvastatin calcium], Metformin and related, and Shellfish allergy   Review of Systems Review of Systems  Eyes: Negative for visual disturbance.  Cardiovascular: Negative for chest pain.  Gastrointestinal: Negative for vomiting.  Neurological: Positive for numbness. Negative for speech difficulty, weakness and headaches.  All other systems reviewed and are negative.    Physical Exam Updated Vital Signs BP (!)  183/86   Pulse 91   Temp 98 F (36.7 C) (Oral)   Resp 19   Ht _0  (1.549 m)   Wt 80.7 kg   SpO2 100%   BMI 33.63 kg/m   Physical Exam Vitals signs and nursing note reviewed.  Constitutional:      General: She is not in acute distress.    Appearance: She is well-developed. She is not ill-appearing or diaphoretic.  HENT:     Head: Normocephalic and atraumatic.     Right Ear: External ear normal.     Left  Ear: External ear normal.     Nose: Nose normal.  Eyes:     General:        Right eye: No discharge.        Left eye: No discharge.     Extraocular Movements: Extraocular movements intact.     Pupils: Pupils are equal, round, and reactive to light.  Cardiovascular:     Rate and Rhythm: Normal rate and regular rhythm.     Heart sounds: Normal heart sounds.  Pulmonary:     Effort: Pulmonary effort is normal.     Breath sounds: Normal breath sounds.  Abdominal:     Palpations: Abdomen is soft.     Tenderness: There is no abdominal tenderness.  Skin:    General: Skin is warm and dry.  Neurological:     Mental Status: She is alert.     Comments: CN 3-12 grossly intact. 5/5 strength in all 4 extremities. Grossly normal sensation. Normal finger to nose.   Psychiatric:        Mood and Affect: Mood is not anxious.      ED Treatments / Results  Labs (all labs ordered are listed, but only abnormal results are displayed) Labs Reviewed  COMPREHENSIVE METABOLIC PANEL - Abnormal; Notable for the following components:      Result Value   Glucose, Bld 216 (*)    All other components within normal limits  I-STAT CHEM 8, ED - Abnormal; Notable for the following components:   Glucose, Bld 212 (*)    All other components within normal limits  CBG MONITORING, ED - Abnormal; Notable for the following components:   Glucose-Capillary 180 (*)    All other components within normal limits  SARS CORONAVIRUS 2 (TAT 6-24 HRS)  ETHANOL  PROTIME-INR  APTT  CBC  DIFFERENTIAL  RAPID  URINE DRUG SCREEN, HOSP PERFORMED  URINALYSIS, ROUTINE W REFLEX MICROSCOPIC    EKG EKG Interpretation  Date/Time:  Thursday September 19 2018 06:36:58 EDT Ventricular Rate:  88 PR Interval:    QRS Duration: 94 QT Interval:  374 QTC Calculation: 453 R Axis:   52 Text Interpretation:  Sinus rhythm Minimal ST depression, lateral leads NO STEMI. Confirmed by Addison Lank [phone]) on 09/19/2018 6:39:47 AM   Radiology Ct Head Wo Contrast  Result Date: 09/19/2018 CLINICAL DATA:  78 year old female with difficulty swallowing since last night. EXAM: CT HEAD WITHOUT CONTRAST TECHNIQUE: Contiguous axial images were obtained from the base of the skull through the vertex without intravenous contrast. COMPARISON:  None. FINDINGS: Brain: Cerebral volume is within normal limits for age. There is Patchy and confluent cerebral white matter hypodensity, greater in the left hemisphere. Hypodensity in the left centrum semiovale on series 3, image 24 most resembles a chronic white matter lacune. Gray-white matter differentiation in the deep gray nuclei, brainstem and cerebellum appears normal. No midline shift, ventriculomegaly, mass effect, evidence of mass lesion, intracranial hemorrhage or evidence of cortically based acute infarction. Vascular: Calcified atherosclerosis at the skull base. No suspicious intracranial vascular hyperdensity. Skull: Negative. Sinuses/Orbits: Visualized paranasal sinuses and mastoids are clear. Other: No acute orbit or scalp soft tissue findings. IMPRESSION: Evidence of cerebral white matter small vessel disease but no acute cortically based infarct or acute intracranial hemorrhage identified. Electronically Signed   By: Genevie Ann M.D.   On: 09/19/2018 07:36   Mr Brain Wo Contrast  Result Date: 09/19/2018 CLINICAL DATA:  78 year old female with difficulty swallowing since last night and left side weakness. EXAM: MRI  HEAD WITHOUT CONTRAST TECHNIQUE: Multiplanar, multiecho pulse  sequences of the brain and surrounding structures were obtained without intravenous contrast. COMPARISON:  Head CT earlier today. FINDINGS: Brain: There is a small linear 5-6 millimeter infarct with restricted diffusion in the lateral medulla on series 3, image 13 and series 4, image 21. Subtle associated T2 hyperintensity. No hemorrhage or mass effect. No other restricted diffusion. No midline shift, mass effect, evidence of mass lesion, ventriculomegaly, extra-axial collection or acute intracranial hemorrhage. Cervicomedullary junction and pituitary are within normal limits. Patchy bilateral cerebral white matter T2 and FLAIR hyperintensity, which at the anterior left centrum semiovale on series 7, image 20 most resembles chronic lacunar infarction. No cortical encephalomalacia or chronic cerebral blood products identified. Negative deep gray nuclei. The cerebellum remains within normal limits. Vascular: Major intracranial vascular flow voids are preserved, the left vertebral artery appears dominant. Skull and upper cervical spine: Negative for age visible cervical spine. Normal bone marrow signal. Sinuses/Orbits: Postoperative changes to both globes, otherwise negative orbits. Paranasal Visualized paranasal sinuses and mastoids are stable and well pneumatized. Other: Visible internal auditory structures appear normal. Scalp and face soft tissues appear negative. IMPRESSION: 1. Positive for a small left lateral medullary lacunar infarct. No associated hemorrhage or mass effect. 2. No other acute intracranial abnormality. Cerebral white matter signal changes suggesting chronic small vessel disease. Electronically Signed   By: Genevie Ann M.D.   On: 09/19/2018 12:46    Procedures Procedures (including critical care time)  Medications Ordered in ED Medications - No data to display   Initial Impression / Assessment and Plan / ED Course  I have reviewed the triage vital signs and the nursing notes.  Pertinent  labs & imaging results that were available during my care of the patient were reviewed by me and considered in my medical decision making (see chart for details).  Clinical Course as of Sep 19 1315  Thu Sep 19, 2018  0806 CT head negative. Dr. Rory Percy recommends MRI brain w/o with thin brainstem slices. Then dispo from there.   [SG]    Clinical Course User Index [SG] Sherwood Gambler, MD       MRI confirms brainstem infarct.  Patient failed her swallow screen.  Neurology consulted and has seen patient.  Permissive hypertension for now.  Hospitalist to admit.  Wanda [name] was evaluated in Emergency Department on 09/19/2018 for the symptoms described in the history of present illness. She was evaluated in the context of the global COVID-19 pandemic, which necessitated consideration that the patient might be at risk for infection with the SARS-CoV-2 virus that causes COVID-19. Institutional protocols and algorithms that pertain to the evaluation of patients at risk for COVID-19 are in a state of rapid change based on information released by regulatory bodies including the CDC and federal and state organizations. These policies and algorithms were followed during the patient's care in the ED.   Final Clinical Impressions(s) / ED Diagnoses   Final diagnoses:  Brainstem infarct, acute [facility])    ED Discharge Orders    None       Sherwood Gambler, MD 09/19/18 1318

## 2018-09-19 NOTE — ED Notes (Signed)
Checked CBG 180, RN Melanie informed

## 2018-09-19 NOTE — Consult Note (Signed)
Neurology Consultation  Reason for Consult: Dysphagia Referring Physician: Dr. Sherwood Gambler  CC: Difficulty swallowing  History is obtained from: Patient, chart  HPI: Wanda [name] is a 78 y.o. female past medical history of diabetes, hypertension, in her usual state of health when she went to bed on Wednesday, 09/17/2018 and woke up the next morning not feeling at her 100%.  She was not really sure what was wrong but by the end of the day, when she sat for dinner around 6 PM, she did not feel well and could not swallow her food.  This is very unusual for her and is never happened to her before.  She did not make much of it and slept.  Symptoms persisted upon waking up, which is what made her come to the emergency room. Initial screening in the emergency room was negative for focal neurological deficits and she is outside the window for IV TPA, hence no code stroke was activated. MRI brain was obtained that showed a left lateral medullary stroke. Neurological consultation was obtained for stroke management.  Patient denies any prior similar symptoms.  She denies any preceding fevers or chills or illnesses.  Denies any chest pain shortness of breath nausea vomiting.  Denies any change in her voice.  Denies any facial droop headache double vision tingling or numbness. She says that she has been having trouble with her blood sugars.  Every time her medications for blood pressure adjusted her sugar levels go out of norm significantly.  Last hemoglobin A1c- August 2020-8.5 Last lipid panel- August 2020-total cholesterol 252, HDL 43, LDL 171, triglycerides 188.  VLDL 37.6.  LKW: Around 6 PM on 09/17/2018 tpa given?: no, outside the window Premorbid modified Rankin scale (mRS): 0  ROS: Review of systems performed and pertinent positives in the HPI.  Rest of the review negative.  Past Medical History:  Diagnosis Date  . Arthritis   . Cataract   . Diabetes mellitus   . Hypertension   . Skin  cancer    Removed from face  . Urgency of urination   . Urinary leakage     Family History  Problem Relation Age of Onset  . Pancreatitis Father        deceased 47  . Cancer Other   . Cancer Brother        GI  . Cancer Mother        liver  . Cancer Son        terminal kidney   Social History:   reports that she has never smoked. She has never used smokeless tobacco. She reports current alcohol use of about 1.0 standard drinks of alcohol per week. She reports that she does not use drugs.  Medications No current facility-administered medications for this encounter.   Current Outpatient Medications:  .  glipiZIDE (GLUCOTROL XL) 10 MG 24 hr tablet, TAKE 1 TABLET(10 MG) BY MOUTH DAILY WITH BREAKFAST (Patient taking differently: Take 10 mg by mouth daily with breakfast. ), Disp: 90 tablet, Rfl: 3 .  Insulin Glargine (LANTUS SOLOSTAR) 100 UNIT/ML Solostar Pen, Inject 15-20 Units into the skin daily. (Patient taking differently: Inject 11-20 Units into the skin daily. ), Disp: 5 pen, Rfl: 11 .  niacin (NIASPAN) 500 MG CR tablet, Take 1 tablet (500 mg total) by mouth at bedtime. Increase to 2 tablets after 1 month (Patient taking differently: Take 500 mg by mouth daily. ), Disp: 180 tablet, Rfl: 2 .  vitamin E 200 UNIT capsule, Take  200 Units by mouth daily. Reported on 05/31/2015, Disp: , Rfl:  .  amLODipine (NORVASC) 10 MG tablet, Take 1 tablet (10 mg total) by mouth daily. (Patient not taking: Reported on 09/19/2018), Disp: 90 tablet, Rfl: 3 .  blood glucose meter kit and supplies, Dispense based on patient and insurance preference. Pt just needs meter, Disp: 1 each, Rfl: 0 .  Blood Glucose Monitoring Suppl (ONE TOUCH ULTRA MINI) w/Device KIT, Use to test blood sugar daily as instructed. Dx: E11.65, Disp: 1 each, Rfl: 0 .  glucose blood (ONE TOUCH ULTRA TEST) test strip, Test blood sugar 3 times a day. Dx code: 250.00, Disp: 100 each, Rfl: 12 .  glucose blood test strip, Test blood sugar 3  times a day. Dx code: 64.00 (Patient not taking: Reported on 09/19/2018), Disp: 100 each, Rfl: 12 .  hydrochlorothiazide (HYDRODIURIL) 25 MG tablet, Take 1 tablet (25 mg total) by mouth daily. (Patient not taking: Reported on 09/19/2018), Disp: 90 tablet, Rfl: 3 .  Insulin Pen Needle (BD PEN NEEDLE NANO U/F) 32G X 4 MM MISC, USE TO INJECT INSULIN ONCE DAILY, Disp: 100 each, Rfl: 6 .  Insulin Syringe-Needle U-100 (INSULIN SYRINGE .5CC/31GX5/16") 31G X 5/16" 0.5 ML MISC, Use to inject insulin 1 time daily., Disp: 90 each, Rfl: 3  Exam: Current vital signs: BP (!) 183/86   Pulse 91   Temp 98 F (36.7 C) (Oral)   Resp 19   Ht 5' 1"  (1.549 m)   Wt 80.7 kg   SpO2 100%   BMI 33.63 kg/m  Vital signs in last 24 hours: Temp:  [98 F (36.7 C)] 98 F (36.7 C) (09/10 3832) Pulse Rate:  [85-91] 91 (09/10 1236) Resp:  [11-24] 19 (09/10 1236) BP: (178-200)/(78-87) 183/86 (09/10 1236) SpO2:  [94 %-100 %] 100 % (09/10 1236) Weight:  [80.7 kg] 80.7 kg (09/10 9191) General exam: Awake alert in no distress HEENT: Normocephalic atraumatic Cardiovascular: Regular rate rhythm Respiratory: Breathing well and saturating normally on room air Extremities: Warm well perfused Abdomen: Nondistended nontender Neurological exam Awake alert oriented x3 Speech is not dysarthric Naming comprehension repetition intact. Attention concentration normal Cranial nerves: Pupils equal round react light, extraocular movements intact, visual fields full, facial sensation intact bilaterally, face is grossly symmetric with a possible subtle left angle of the mouth droopiness at rest-she said she has not noticed any facial droop, auditory acuity grossly intact, palate elevates in the midline and the uvula is midline, shoulder shrug intact, tongue midline. Motor exam: 5/5 in all 4 extremities with no drift Sensory exam: Intact to light touch all over with no extinction Coordination: Intact finger-nose-finger as well as  heel-knee-shin testing bilaterally Gait testing was deferred at this time NIH stroke scale-0   Labs I have reviewed labs in epic and the results pertinent to this consultation are: CBC    Component Value Date/Time   WBC 5.6 09/19/2018 0739   RBC 4.65 09/19/2018 0739   HGB 14.3 09/19/2018 0747   HCT 42.0 09/19/2018 0747   PLT 238 09/19/2018 0739   MCV 89.9 09/19/2018 0739   MCV 90.8 06/19/2011 1602   MCH 30.1 09/19/2018 0739   MCHC 33.5 09/19/2018 0739   RDW 12.9 09/19/2018 0739   LYMPHSABS 1.1 09/19/2018 0739   MONOABS 0.5 09/19/2018 0739   EOSABS 0.1 09/19/2018 0739   BASOSABS 0.0 09/19/2018 0739    CMP     Component Value Date/Time   NA 142 09/19/2018 0747   K 4.0 09/19/2018 0747  CL 108 09/19/2018 0747   CO2 23 09/19/2018 0739   GLUCOSE 212 (H) 09/19/2018 0747   BUN 13 09/19/2018 0747   CREATININE 0.70 09/19/2018 0747   CREATININE 0.85 07/30/2013 1021   CALCIUM 9.8 09/19/2018 0739   PROT 7.3 09/19/2018 0739   ALBUMIN 4.2 09/19/2018 0739   AST 18 09/19/2018 0739   ALT 16 09/19/2018 0739   ALKPHOS 44 09/19/2018 0739   BILITOT 0.6 09/19/2018 0739   GFRNONAA >60 09/19/2018 0739   GFRAA >60 09/19/2018 0739    Lipid Panel     Component Value Date/Time   CHOL 252 (H) 08/21/2018 1406   TRIG 188.0 (H) 08/21/2018 1406   HDL 43.00 08/21/2018 1406   CHOLHDL 6 08/21/2018 1406   VLDL 37.6 08/21/2018 1406   LDLCALC 171 (H) 08/21/2018 1406   LDLDIRECT 156.0 04/15/2015 1020     Imaging I have reviewed the images obtained:  CT-scan of the brain-no acute changes.  Cerebral white matter disease.  No bleed.  MRI examination of the brain- small left lateral medullary lacunar infarct.  No hemorrhage mass-effect.  No acute intracranial abnormality.  Cerebral white matter disease signal changes suggesting chronic small vessel disease.   Assessment: 78 year old with hypertension, diabetes, hyperlipidemia presenting to the emergency room for difficulty swallowing that  started about a day and a half ago. MRI- left lateral medullary infarct. Small vessel etiology stroke likely from multiple risk factors that include hypertension/diabetes/hyperlipidemia. Last A1c 8.5 Last LDL 171.  Impression: Acute ischemic stroke-small vessel etiology  Recommendations: -Admit to hospitalist or observation -Telemetry monitoring -Allow for permissive hypertension for the first 24-48h - only treat PRN if SBP >220 mmHg. Blood pressures can be gradually normalized to SBP<140 upon discharge. -CT Angiogram of Head and neck -Echocardiogram -HgbA1c, fasting lipid panel -Frequent neuro checks -Prophylactic therapy-Antiplatelet med: Aspirin - dose 322m PO or 3099mPR -Atorvastatin 80 mg PO daily -N.p.o. until cleared by speech evaluation. -PT consult, OT consult, Speech consult -If Afib found on telemetry, will need anticoagulation. Decision pending imaging and stroke team rounding.  Plan relayed to Dr. GoRegenia Skeeter EDP  Please page stroke NP/PA/MD (listed on AMION)  from 8am-4 pm as this patient will be followed by the stroke team at this point.  -- AsAmie PortlandMD [facility] Pager: [phone] 7pm to 7am, please call on call as listed on AMION.

## 2018-09-19 NOTE — ED Notes (Signed)
Patient transported to CT 

## 2018-09-20 ENCOUNTER — Inpatient Hospital Stay (HOSPITAL_COMMUNITY): Payer: Medicare Other

## 2018-09-20 ENCOUNTER — Encounter (HOSPITAL_COMMUNITY): Payer: Self-pay | Admitting: *Deleted

## 2018-09-20 DIAGNOSIS — I6302 Cerebral infarction due to thrombosis of basilar artery: Secondary | ICD-10-CM

## 2018-09-20 DIAGNOSIS — E1169 Type 2 diabetes mellitus with other specified complication: Secondary | ICD-10-CM

## 2018-09-20 DIAGNOSIS — E669 Obesity, unspecified: Secondary | ICD-10-CM

## 2018-09-20 DIAGNOSIS — E785 Hyperlipidemia, unspecified: Secondary | ICD-10-CM

## 2018-09-20 DIAGNOSIS — E1165 Type 2 diabetes mellitus with hyperglycemia: Secondary | ICD-10-CM

## 2018-09-20 DIAGNOSIS — I6389 Other cerebral infarction: Secondary | ICD-10-CM

## 2018-09-20 LAB — ECHOCARDIOGRAM COMPLETE
Height: 61 in
Weight: 2848 oz

## 2018-09-20 LAB — GLUCOSE, CAPILLARY
Glucose-Capillary: 119 mg/dL — ABNORMAL HIGH (ref 70–99)
Glucose-Capillary: 134 mg/dL — ABNORMAL HIGH (ref 70–99)
Glucose-Capillary: 151 mg/dL — ABNORMAL HIGH (ref 70–99)
Glucose-Capillary: 191 mg/dL — ABNORMAL HIGH (ref 70–99)
Glucose-Capillary: 294 mg/dL — ABNORMAL HIGH (ref 70–99)

## 2018-09-20 LAB — LIPID PANEL
Cholesterol: 236 mg/dL — ABNORMAL HIGH (ref 0–200)
HDL: 34 mg/dL — ABNORMAL LOW (ref >40)
LDL Cholesterol: 158 mg/dL — ABNORMAL HIGH (ref 0–99)
Total CHOL/HDL Ratio: 6.9 RATIO
Triglycerides: 222 mg/dL — ABNORMAL HIGH (ref <150)
VLDL: 44 mg/dL — ABNORMAL HIGH (ref 0–40)

## 2018-09-20 LAB — MAGNESIUM: Magnesium: 1.7 mg/dL (ref 1.7–2.4)

## 2018-09-20 LAB — PHOSPHORUS: Phosphorus: 2.9 mg/dL (ref 2.5–4.6)

## 2018-09-20 LAB — SARS CORONAVIRUS 2 (TAT 6-24 HRS): SARS Coronavirus 2: NEGATIVE

## 2018-09-20 MED ORDER — IOHEXOL 350 MG/ML SOLN
75.0000 mL | Freq: Once | INTRAVENOUS | Status: AC | PRN
  Administered 2018-09-20: 09:00:00 75 mL via INTRAVENOUS

## 2018-09-20 MED ORDER — VITAL HIGH PROTEIN PO LIQD: 1000.0000 mL | ORAL | Status: DC

## 2018-09-20 MED ORDER — ROSUVASTATIN CALCIUM 5 MG PO TABS
20.0000 mg | ORAL_TABLET | Freq: Every day | ORAL | Status: DC
  Filled 2018-09-20: qty 4

## 2018-09-20 MED ORDER — JEVITY 1.2 CAL PO LIQD
1000.0000 mL | ORAL | Status: DC
  Administered 2018-09-20 – 2018-09-22 (×4): 1000 mL
  Filled 2018-09-20 (×8): qty 1000

## 2018-09-20 MED ORDER — CLOPIDOGREL BISULFATE 75 MG PO TABS
75.0000 mg | ORAL_TABLET | Freq: Every day | ORAL | Status: DC
  Administered 2018-09-20 – 2018-09-23 (×4): 75 mg via ORAL
  Filled 2018-09-20 (×4): qty 1

## 2018-09-20 MED ORDER — INSULIN ASPART 100 UNIT/ML ~~LOC~~ SOLN
0.0000 [IU] | SUBCUTANEOUS | Status: DC
  Administered 2018-09-21 (×2): 5 [IU] via SUBCUTANEOUS
  Administered 2018-09-21: 13:00:00 3 [IU] via SUBCUTANEOUS
  Administered 2018-09-21: 5 [IU] via SUBCUTANEOUS

## 2018-09-20 MED ORDER — PRO-STAT SUGAR FREE PO LIQD: 30.0000 mL | Freq: Two times a day (BID) | ORAL | Status: DC

## 2018-09-20 NOTE — Progress Notes (Signed)
PROGRESS NOTE  Wanda [name] [id number] DOB: Dec 20, 1940   PCP: Darreld Mclean, MD  Patient is from: Home  DOA: 09/19/2018 LOS: 1  Brief Narrative / Interim history: 78 year old female with history of DM-2, HTN, HLD, goiter and medication noncompliance presenting with dysphagia, choking episode and right arm paresthesia for 2 days and found to have small left lateral medullary infarct on MRI.  Neurology guiding management. Failed swallow eval. Plan is to place Cortrack temporarily.   Subjective: No major events overnight of this morning.  Failed swallow eval by SLP this morning. Denies headache, vision change, difficulty speaking, focal weakness or numbness other than diminished pain sensation in the right arm.  She denies chest pain, dyspnea, GI or GU symptoms.  Objective: Vitals:   09/20/18 0342 09/20/18 0614 09/20/18 0700 09/20/18 0800  BP:  (!) 164/71 (!) 149/66 (!) 158/83  Pulse:  75 73 79  Resp:  19 18 17   Temp: 98 F (36.7 C)   98 F (36.7 C)  TempSrc: Oral   Oral  SpO2:  96% 96% 98%  Weight:      Height:        Intake/Output Summary (Last 24 hours) at 09/20/2018 1102 Last data filed at 09/20/2018 0800 Gross per 24 hour  Intake 722.57 ml  Output --  Net 722.57 ml   Filed Weights   09/19/18 0633  Weight: 80.7 kg    Examination:  GENERAL: No acute distress.  Appears well.  HEENT: MMM.  Vision and hearing grossly intact.  PERRLA.  EOMI.  Slight left facial droop. NECK: Supple.  No apparent JVD.  RESP:  No IWOB. Good air movement bilaterally. CVS:  RRR. Heart sounds normal.  ABD/GI/GU: Bowel sounds present. Soft. Non tender.  MSK/EXT:  Moves extremities. No apparent deformity or edema.  SKIN: no apparent skin lesion or wound NEURO: Awake, alert and oriented appropriately.  PERRLA.  EOMI.  Facial sensation intact.  Slight facial droop on the left.  Tongue protrusion midline.  Motor 5/5 in all muscle groups of UE and LE bilaterally, normal tone, light  sensation intact in all dermatomes of upper and lower ext bilaterally, no pronator drift, patellar reflexes symmetric, finger to nose intact.  PSYCH: Calm. Normal affect.   Assessment & Plan: Acute ischemic left lateral medullary stroke -Symptoms include dysphagia, diminished sensation to noxious stimuli in her RUE and slight left facial droop -CT head without contrast negative.  MRI brain revealed acute left lateral medullary infarct. -CTA head and neck pending. -Neurology to start Plavix and statin after Cortrack.  Allergic to aspirin. -Permissive hypertension -Failed swallow eval by SLP-cortrack to be placed. -PT/OT eval -We will follow further neuro recommendations.  Essential hypertension: Recently stopped amlodipine and HCTZ at the same time due to  Uncontrolled IDDM-2 with hyperglycemia: A1c 8.6%.  On insulin and glipizide at home.  CBG 151 this morning. -Continue SSI-thin-out adjust as appropriate to keep euglycemia. -CBG monitoring -Continue statin  Hyperlipidemia: LDL 158. -Continue high intensity statin  History of goiter: Not on any medication.  TSH within normal range in 08/2018. -Continue monitoring  Obesity: BMI 33.63 -Encourage lifestyle change to lose weight.  DVT prophylaxis: Subcu Lovenox Code Status: Full code Family Communication: Patient and/or RN. Available if any question.  Disposition Plan: Remains inpatient. Consultants: Neurology  Procedures:  None  Microbiology summarized: SARS-CoV-2 screen negative.  Antimicrobials: Anti-infectives (From admission, onward)   None      Sch Meds:  Scheduled Meds:   stroke: mapping our early  stages of recovery book   Does not apply Once   Chlorhexidine Gluconate Cloth  6 each Topical Daily   enoxaparin (LOVENOX) injection  40 mg Subcutaneous Q24H   insulin aspart  0-5 Units Subcutaneous QHS   insulin aspart  0-9 Units Subcutaneous TID WC   Continuous Infusions:  sodium chloride 50 mL/hr at  09/20/18 0800   PRN Meds:.acetaminophen **OR** acetaminophen (TYLENOL) oral liquid 160 mg/5 mL **OR** acetaminophen   I have personally reviewed the following labs and images: CBC: Recent Labs  Lab 09/19/18 0739 09/19/18 0747  WBC 5.6  --   NEUTROABS 3.9  --   HGB 14.0 14.3  HCT 41.8 42.0  MCV 89.9  --   PLT 238  --    BMP &GFR Recent Labs  Lab 09/19/18 0739 09/19/18 0747  NA 139 142  K 4.0 4.0  CL 106 108  CO2 23  --   GLUCOSE 216* 212*  BUN 11 13  CREATININE 0.77 0.70  CALCIUM 9.8  --    Estimated Creatinine Clearance: 55.8 mL/min (by C-G formula based on SCr of 0.7 mg/dL). Liver & Pancreas: Recent Labs  Lab 09/19/18 0739  AST 18  ALT 16  ALKPHOS 44  BILITOT 0.6  PROT 7.3  ALBUMIN 4.2   No results for input(s): LIPASE, AMYLASE in the last 168 hours. No results for input(s): AMMONIA in the last 168 hours. Diabetic: No results for input(s): HGBA1C in the last 72 hours. Recent Labs  Lab 09/19/18 0641 09/19/18 1624 09/19/18 2152 09/20/18 0731  GLUCAP 180* 164* 138* 151*   Cardiac Enzymes: No results for input(s): CKTOTAL, CKMB, CKMBINDEX, TROPONINI in the last 168 hours. No results for input(s): PROBNP in the last 8760 hours. Coagulation Profile: Recent Labs  Lab 09/19/18 0739  INR 1.0   Thyroid Function Tests: No results for input(s): TSH, T4TOTAL, FREET4, T3FREE, THYROIDAB in the last 72 hours. Lipid Profile: Recent Labs    09/20/18 0625  CHOL 236*  HDL 34*  LDLCALC 158*  TRIG 222*  CHOLHDL 6.9   Anemia Panel: No results for input(s): VITAMINB12, FOLATE, FERRITIN, TIBC, IRON, RETICCTPCT in the last 72 hours. Urine analysis:    Component Value Date/Time   COLORURINE YELLOW 09/19/2018 0758   APPEARANCEUR CLEAR 09/19/2018 0758   LABSPEC 1.010 09/19/2018 0758   PHURINE 6.0 09/19/2018 0758   GLUCOSEU NEGATIVE 09/19/2018 0758   HGBUR NEGATIVE 09/19/2018 0758   BILIRUBINUR NEGATIVE 09/19/2018 0758   BILIRUBINUR neg 07/18/2012 [place] 09/19/2018 0758   PROTEINUR NEGATIVE 09/19/2018 0758   UROBILINOGEN 0.2 07/18/2012 0938   NITRITE NEGATIVE 09/19/2018 0758   LEUKOCYTESUR NEGATIVE 09/19/2018 0758   Sepsis Labs: Invalid input(s): PROCALCITONIN, [place]  Microbiology: Recent Results (from the past 240 hour(s))  SARS CORONAVIRUS 2 (TAT 6-24 HRS) Nasopharyngeal Nasopharyngeal Swab     Status: None   Collection Time: 09/19/18  1:00 PM   Specimen: Nasopharyngeal Swab  Result Value Ref Range Status   SARS Coronavirus 2 NEGATIVE NEGATIVE Final    Comment: (NOTE) SARS-CoV-2 target nucleic acids are NOT DETECTED. The SARS-CoV-2 RNA is generally detectable in upper and lower respiratory specimens during the acute phase of infection. Negative results do not preclude SARS-CoV-2 infection, do not rule out co-infections with other pathogens, and should not be used as the sole basis for treatment or other patient management decisions. Negative results must be combined with clinical observations, patient history, and epidemiological information. The expected result is Negative. Fact Sheet for  Patients: SugarRoll.be Fact Sheet for Healthcare Providers: https://www.woods-mathews.com/ This test is not yet approved or cleared by the [place] FDA and  has been authorized for detection and/or diagnosis of SARS-CoV-2 by FDA under an Emergency Use Authorization (EUA). This EUA will remain  in effect (meaning this test can be used) for the duration of the COVID-19 declaration under Section 56 4(b)(1) of the Act, 21 U.S.C. section 360bbb-3(b)(1), unless the authorization is terminated or revoked sooner. Performed at [facility], [place] [address] [place], [place] [address]     Radiology Studies: Dg Chest 2 View  Result Date: 09/19/2018 CLINICAL DATA:  CVA EXAM: CHEST - 2 VIEW COMPARISON:  09/24/2015 FINDINGS: The heart size and mediastinal contours are  within normal limits. Both lungs are clear. Disc degenerative disease of the thoracic spine. IMPRESSION: No acute abnormality of the lungs. Electronically Signed   By: Eddie Candle M.D.   On: 09/19/2018 19:35   Mr Brain Wo Contrast  Result Date: 09/19/2018 CLINICAL DATA:  78 year old female with difficulty swallowing since last night and left side weakness. EXAM: MRI HEAD WITHOUT CONTRAST TECHNIQUE: Multiplanar, multiecho pulse sequences of the brain and surrounding structures were obtained without intravenous contrast. COMPARISON:  Head CT earlier today. FINDINGS: Brain: There is a small linear 5-6 millimeter infarct with restricted diffusion in the lateral medulla on series 3, image 13 and series 4, image 21. Subtle associated T2 hyperintensity. No hemorrhage or mass effect. No other restricted diffusion. No midline shift, mass effect, evidence of mass lesion, ventriculomegaly, extra-axial collection or acute intracranial hemorrhage. Cervicomedullary junction and pituitary are within normal limits. Patchy bilateral cerebral white matter T2 and FLAIR hyperintensity, which at the anterior left centrum semiovale on series 7, image 20 most resembles chronic lacunar infarction. No cortical encephalomalacia or chronic cerebral blood products identified. Negative deep gray nuclei. The cerebellum remains within normal limits. Vascular: Major intracranial vascular flow voids are preserved, the left vertebral artery appears dominant. Skull and upper cervical spine: Negative for age visible cervical spine. Normal bone marrow signal. Sinuses/Orbits: Postoperative changes to both globes, otherwise negative orbits. Paranasal Visualized paranasal sinuses and mastoids are stable and well pneumatized. Other: Visible internal auditory structures appear normal. Scalp and face soft tissues appear negative. IMPRESSION: 1. Positive for a small left lateral medullary lacunar infarct. No associated hemorrhage or mass effect. 2. No  other acute intracranial abnormality. Cerebral white matter signal changes suggesting chronic small vessel disease. Electronically Signed   By: Genevie Ann M.D.   On: 09/19/2018 12:46   35 minutes with more than 50% spent in reviewing records, counseling patient and coordinating care.  Velton Roselle T. [place]  If 7PM-7AM, please contact night-coverage [email or web address] Password [email or web address] 09/20/2018, 11:02 AM

## 2018-09-20 NOTE — Procedures (Signed)
Cortrak  Person Inserting Tube:  Esaw Dace, RD Tube Type:  Cortrak - 43 inches Tube Location:  Left nare Initial Placement:  Stomach Secured by: Bridle Technique Used to Measure Tube Placement:  Documented cm marking at nare/ corner of mouth Cortrak Secured At:  60 cm Procedure Comments:  Cortrak Tube Team Note:  Consult received to place a Cortrak feeding tube.   No x-ray is required. RN may begin using tube.   If the tube becomes dislodged please keep the tube and contact the Cortrak team at [email or web address] (password [email or web address]) for replacement.  If after hours and replacement cannot be delayed, place a NG tube and confirm placement with an abdominal x-ray.    [employer] MS, RDN, LDN, CNSC [phone] Pager  [phone] Weekend/On-Call Pager

## 2018-09-20 NOTE — Progress Notes (Signed)
STROKE TEAM PROGRESS NOTE   INTERVAL HISTORY Pt sitting in chair. Son at bedside. Pt still has mild swallowing difficulty, did not pass swallow, will do cortrak today. Otherwise, neuro intact. MRI showed small medullary infarct. CTA head and neck pending.   Vitals:   09/20/18 0342 09/20/18 0614 09/20/18 0700 09/20/18 0800  BP:  (!) 164/71 (!) 149/66 (!) 158/83  Pulse:  75 73 79  Resp:  19 18 17   Temp: 98 F (36.7 C)   98 F (36.7 C)  TempSrc: Oral   Oral  SpO2:  96% 96% 98%  Weight:      Height:        CBC:  Recent Labs  Lab 09/19/18 0739 09/19/18 0747  WBC 5.6  --   NEUTROABS 3.9  --   HGB 14.0 14.3  HCT 41.8 42.0  MCV 89.9  --   PLT 238  --     Basic Metabolic Panel:  Recent Labs  Lab 09/19/18 0739 09/19/18 0747  NA 139 142  K 4.0 4.0  CL 106 108  CO2 23  --   GLUCOSE 216* 212*  BUN 11 13  CREATININE 0.77 0.70  CALCIUM 9.8  --    Lipid Panel:     Component Value Date/Time   CHOL 236 (H) 09/20/2018 0625   TRIG 222 (H) 09/20/2018 0625   HDL 34 (L) 09/20/2018 0625   CHOLHDL 6.9 09/20/2018 0625   VLDL 44 (H) 09/20/2018 0625   LDLCALC 158 (H) 09/20/2018 0625   HgbA1c:  Lab Results  Component Value Date   HGBA1C 8.5 (H) 08/21/2018   Urine Drug Screen:     Component Value Date/Time   LABOPIA NONE DETECTED 09/19/2018 0758   COCAINSCRNUR NONE DETECTED 09/19/2018 0758   LABBENZ NONE DETECTED 09/19/2018 0758   AMPHETMU NONE DETECTED 09/19/2018 0758   THCU NONE DETECTED 09/19/2018 0758   LABBARB NONE DETECTED 09/19/2018 0758    Alcohol Level     Component Value Date/Time   ETH <10 09/19/2018 0739    IMAGING Dg Chest 2 View  Result Date: 09/19/2018 CLINICAL DATA:  CVA EXAM: CHEST - 2 VIEW COMPARISON:  09/24/2015 FINDINGS: The heart size and mediastinal contours are within normal limits. Both lungs are clear. Disc degenerative disease of the thoracic spine. IMPRESSION: No acute abnormality of the lungs. Electronically Signed   By: Eddie Candle M.D.    On: 09/19/2018 19:35   Ct Head Wo Contrast  Result Date: 09/19/2018 CLINICAL DATA:  78 year old female with difficulty swallowing since last night. EXAM: CT HEAD WITHOUT CONTRAST TECHNIQUE: Contiguous axial images were obtained from the base of the skull through the vertex without intravenous contrast. COMPARISON:  None. FINDINGS: Brain: Cerebral volume is within normal limits for age. There is Patchy and confluent cerebral white matter hypodensity, greater in the left hemisphere. Hypodensity in the left centrum semiovale on series 3, image 24 most resembles a chronic white matter lacune. Gray-white matter differentiation in the deep gray nuclei, brainstem and cerebellum appears normal. No midline shift, ventriculomegaly, mass effect, evidence of mass lesion, intracranial hemorrhage or evidence of cortically based acute infarction. Vascular: Calcified atherosclerosis at the skull base. No suspicious intracranial vascular hyperdensity. Skull: Negative. Sinuses/Orbits: Visualized paranasal sinuses and mastoids are clear. Other: No acute orbit or scalp soft tissue findings. IMPRESSION: Evidence of cerebral white matter small vessel disease but no acute cortically based infarct or acute intracranial hemorrhage identified. Electronically Signed   By: Genevie Ann M.D.   On:  09/19/2018 07:36   Mr Brain Wo Contrast  Result Date: 09/19/2018 CLINICAL DATA:  78 year old female with difficulty swallowing since last night and left side weakness. EXAM: MRI HEAD WITHOUT CONTRAST TECHNIQUE: Multiplanar, multiecho pulse sequences of the brain and surrounding structures were obtained without intravenous contrast. COMPARISON:  Head CT earlier today. FINDINGS: Brain: There is a small linear 5-6 millimeter infarct with restricted diffusion in the lateral medulla on series 3, image 13 and series 4, image 21. Subtle associated T2 hyperintensity. No hemorrhage or mass effect. No other restricted diffusion. No midline shift, mass  effect, evidence of mass lesion, ventriculomegaly, extra-axial collection or acute intracranial hemorrhage. Cervicomedullary junction and pituitary are within normal limits. Patchy bilateral cerebral white matter T2 and FLAIR hyperintensity, which at the anterior left centrum semiovale on series 7, image 20 most resembles chronic lacunar infarction. No cortical encephalomalacia or chronic cerebral blood products identified. Negative deep gray nuclei. The cerebellum remains within normal limits. Vascular: Major intracranial vascular flow voids are preserved, the left vertebral artery appears dominant. Skull and upper cervical spine: Negative for age visible cervical spine. Normal bone marrow signal. Sinuses/Orbits: Postoperative changes to both globes, otherwise negative orbits. Paranasal Visualized paranasal sinuses and mastoids are stable and well pneumatized. Other: Visible internal auditory structures appear normal. Scalp and face soft tissues appear negative. IMPRESSION: 1. Positive for a small left lateral medullary lacunar infarct. No associated hemorrhage or mass effect. 2. No other acute intracranial abnormality. Cerebral white matter signal changes suggesting chronic small vessel disease. Electronically Signed   By: Genevie Ann M.D.   On: 09/19/2018 12:46    PHYSICAL EXAM  Temp:  [98 F (36.7 C)-98.2 F (36.8 C)] 98.1 F (36.7 C) (09/11 1200) Pulse Rate:  [70-104] 104 (09/11 1100) Resp:  [11-21] 17 (09/11 1100) BP: (147-187)/(61-99) 171/89 (09/11 1100) SpO2:  [94 %-100 %] 98 % (09/11 1100)  General - Well nourished, well developed, in no apparent distress.  Ophthalmologic - fundi not visualized due to noncooperation.  Cardiovascular - Regular rate and rhythm.  Mental Status -  Level of arousal and orientation to time, place, and person were intact. Language including expression, naming, repetition, comprehension was assessed and found intact. Attention span and concentration were  normal. Fund of Knowledge was assessed and was intact.  Cranial Nerves II - XII - II - Visual field intact OU. III, IV, VI - Extraocular movements intact. V - Facial sensation intact bilaterally. VII - Facial movement intact bilaterally. VIII - Hearing & vestibular intact bilaterally. X - Palate elevates symmetrically. XI - Chin turning & shoulder shrug intact bilaterally. XII - Tongue protrusion intact.  Motor Strength - The patient's strength was normal in all extremities and pronator drift was absent.  Bulk was normal and fasciculations were absent.   Motor Tone - Muscle tone was assessed at the neck and appendages and was normal.  Reflexes - The patient's reflexes were symmetrical in all extremities and she had no pathological reflexes.  Sensory - Light touch, temperature/pinprick were assessed and were symmetrical.    Coordination - The patient had normal movements in the hands and feet with no ataxia or dysmetria.  Tremor was absent.  Gait and Station - deferred.   ASSESSMENT/PLAN [name] is a 78 y.o. female with history of DB and HTN presenting with not feeling well, difficulty swallowing.   Stroke:  L lateral medullary infarct secondary to small or large vessel disease source  CT head No acute abnormality. Old left BG and  SO infarct. Small vessel disease.  MRI  Small L lateral medullary lacune. Small vessel disease.   CTA head & neck b/l ICA stenosis, right 75% and left 70%. Right VA origin stenosis, left V4 stenosis with athero. 50% proximal right subclavian stenosis  2D Echo pending  LDL 158  HgbA1c pending  Lovenox 40 mg sq daily  for VTE prophylaxis  No antithrombotic prior to admission, now on No antithrombotic. She is allergic to aspirin (hives) and currently cannot swallow. Add plavix once po access.  Continue plavix on discharge.  Therapy recommendations:  pending   Disposition:  pending   Carotid stenosis  CTA showed b/l ICA stenosis.  Right 75% and left 70% stenosis  Asymptomatic at this time  Pt dose has left BG and SO old infarcts  Pt needs to follow up with [facility] as outpt for monitoring and management   Hypertension  Stable on the high end . Gradually normalize in 5-7 days . Long-term BP goal 130-150 given b/l ICA stenosis  Hyperlipidemia  Home meds:  No statin  Listed intolerance to lipitor d/t whole body aches  LDL 158, goal < 70  Put on crestor 20   Continue statin at discharge  Diabetes type II Uncontrolled  HgbA1c pending, goal < 7.0  CBGs  SSI  Close PCP follow up  Dysphagia . Secondary to stroke . NPO . Speech on board . Will have cortrak today   Other Stroke Risk Factors  Advanced age  ETOH use, advised to drink no more than 1 drink(s) a day  Obesity, Body mass index is 33.63 kg/m., recommend weight loss, diet and exercise as appropriate   Other Active Problems    Hospital day # 1  Neurology will sign off. Please call with questions. Pt will follow up with stroke clinic NP at [facility] in about 4 weeks. Thanks for the consult.  Rosalin Hawking, MD PhD Stroke Neurology 09/20/2018 12:21 PM    To contact Stroke Continuity provider, please refer to [email or web address] After hours, contact General Neurology

## 2018-09-20 NOTE — Evaluation (Signed)
Occupational Therapy Evaluation Patient Details Name: Wanda [name] MRN: [id number] DOB: October 13, 1940 Today's Date: 09/20/2018    History of Present Illness Pt is a 78 yo female admitted with acute issues with swallowing/choking. Pt does state she had stopped taking BP meds over last few weeks due to side effects and had some R arm parasthesias lately.  Pt found to have L medullary lacunar infarct on MRI. Pt with PMH of DM, arthritis, cataracts and HTN.     Clinical Impression   Pt admitted with the above diagnosis and has the limited deficits below. Pt most limited in area of swallowing but does have very minor balance deficits affecting independence with adls and adl tranfers. Will continue to see pt to attempt to get to modified independent or independent level of care so she can d/c home alone.  Pt would benefit from getting up over the weekend and walking in hallway with staff or family as much as possible.     Follow Up Recommendations  No OT follow up;Supervision - Intermittent    Equipment Recommendations  Other (comment)(rec shower chair but pt declined.)    Recommendations for Other Services       Precautions / Restrictions Precautions Precautions: Fall Restrictions Weight Bearing Restrictions: No      Mobility Bed Mobility Overal bed mobility: Modified Independent             General bed mobility comments: extra time needed.  Did not use rails.  Transfers Overall transfer level: Needs assistance Equipment used: 1 person hand held assist Transfers: Sit to/from [facility] Sit to Stand: Supervision Stand pivot transfers: Supervision       General transfer comment: Pt feels unsteady but gets better the more she moved around.    Balance Overall balance assessment: Needs assistance Sitting-balance support: Feet supported Sitting balance-Leahy Scale: Good     Standing balance support: During functional activity;Single extremity  supported Standing balance-Leahy Scale: Fair Standing balance comment: slightly unsteady at times but did not require outside assist.                           ADL either performed or assessed with clinical judgement   ADL Overall ADL's : Needs assistance/impaired Eating/Feeding: NPO Eating/Feeding Details (indicate cue type and reason): difficulty swallowing Grooming: Wash/dry hands;Wash/dry face;Oral care;Set up;Standing   Upper Body Bathing: Set up;Sitting   Lower Body Bathing: Supervison/ safety;Sit to/from stand   Upper Body Dressing : Set up;Sitting   Lower Body Dressing: Supervision/safety;Sit to/from stand   Toilet Transfer: Min guard;Ambulation;Regular Toilet   Toileting- Water quality scientist and Hygiene: Supervision/safety;Sit to/from stand       Functional mobility during ADLs: Min guard General ADL Comments: Pt completes most adls with very little assist. Pt walked to bathroom and in hall with hand held assist and min assist for balance. pt feels slightly unsteady but has not walked much in the last 48 hours. Lives alone.  Family close by.      Vision Baseline Vision/History: No visual deficits Patient Visual Report: No change from baseline Vision Assessment?: No apparent visual deficits     Perception Perception Perception Tested?: No   Praxis Praxis Praxis tested?: Within functional limits    Pertinent Vitals/Pain Pain Assessment: No/denies pain     Hand Dominance Right   Extremity/Trunk Assessment Upper Extremity Assessment Upper Extremity Assessment: Overall WFL for tasks assessed   Lower Extremity Assessment Lower Extremity Assessment: Defer to PT evaluation  Cervical / Trunk Assessment Cervical / Trunk Assessment: Normal   Communication Communication Communication: No difficulties   Cognition Arousal/Alertness: Awake/alert Behavior During Therapy: WFL for tasks assessed/performed Overall Cognitive Status: Within Functional  Limits for tasks assessed                                     General Comments  Pt lives alone.  Has someone that can stay with her for a day or two if necessary.  Will continue to see to attempt to get from S level to mod I.    Exercises     Shoulder Instructions      Home Living Family/patient expects to be discharged to:: Private residence Living Arrangements: Alone Available Help at Discharge: Family;Available PRN/intermittently;Other (Comment)(said someone could stay for a couple of days if needed) Type of Home: Apartment Home Access: Level entry     Home Layout: One level     Bathroom Shower/Tub: Tub/shower unit;Curtain   Biochemist, clinical: Standard     Home Equipment: Cane - single point;Walker - 2 wheels   Additional Comments: drives and very independent.      Prior Functioning/Environment Level of Independence: Independent                 OT Problem List: Impaired balance (sitting and/or standing);Decreased knowledge of use of DME or AE      OT Treatment/Interventions: Self-care/ADL training;Therapeutic activities    OT Goals(Current goals can be found in the care plan section) Acute Rehab OT Goals Patient Stated Goal: to go home OT Goal Formulation: With patient Time For Goal Achievement: 10/04/18 Potential to Achieve Goals: Good ADL Goals Additional ADL Goal #1: Pt will walk to bathroom and toilet Ily Additional ADL Goal #2: Pt will walk to bathroom and step over tub shower to perform transfer Ily Additional ADL Goal #3: Pt will walk around room and make bed without assistive device Ily.  OT Frequency: Min 2X/week   Barriers to D/C: Decreased caregiver support  lives alone but close to modified independent level.       Co-evaluation              AM-PAC OT "6 Clicks" Daily Activity     Outcome Measure Help from another person eating meals?: None Help from another person taking care of personal grooming?: None Help from  another person toileting, which includes using toliet, bedpan, or urinal?: A Little Help from another person bathing (including washing, rinsing, drying)?: A Little Help from another person to put on and taking off regular upper body clothing?: None Help from another person to put on and taking off regular lower body clothing?: None 6 Click Score: 22   End of Session Equipment Utilized During Treatment: Gait belt Nurse Communication: Mobility status  Activity Tolerance: Patient tolerated treatment well Patient left: in chair;with call bell/phone within reach;with family/visitor present  OT Visit Diagnosis: Unsteadiness on feet (R26.81)                Time: [id number] OT Time Calculation (min): 23 min Charges:  OT General Charges $OT Visit: 1 Visit OT Evaluation $OT Eval Moderate Complexity: 1 [address] OTR/L [id number]  Glenford Peers 09/20/2018, 11:20 AM

## 2018-09-20 NOTE — Progress Notes (Signed)
Initial Nutrition Assessment  DOCUMENTATION CODES:   Obesity unspecified  INTERVENTION:   Once cortrak tube is placed:   Initiate Jevity 1.2 @ 65 ml/hr via cortrak tube  Tube feeding regimen provides 1872 kcal (100% of needs), 87 grams of protein, and 1259 ml of H2O.   NUTRITION DIAGNOSIS:   Inadequate oral intake related to inability to eat as evidenced by NPO status.  GOAL:   Patient will meet greater than or equal to 90% of their needs  MONITOR:   Diet advancement, Labs, Weight trends, Skin, TF tolerance, I & O's  REASON FOR ASSESSMENT:   Other (Comment)    ASSESSMENT:   Wanda [name]  is a 78 y.o. female, with past medical history significant for diabetes mellitus and hypertension, noncompliant with her blood pressure medications presenting with 2 days history of problems swallowing and choking.  No headaches, shortness of breath, nausea , vomiting or diarrhea .   Patient admits that she has not taken her blood pressure medicine for the last couple of months due to side effects and did not follow-up with her PMD.  Patient also reports that she has been having problems with right arm paresthesias and decreased sensationsIn the emergency room MRI showed acute small left medullary lacunar infarct.  Blood work was normal.  Pt admitted with CVA/ small lt medullary infarct with resulting dysphagia.   9/11- s/p BSE- recommend temporary alternative mean of nutrition, NPO  Reviewed I/O's: +673 ml x 24 hours  Case discussed with cortrak tube team RD, who alerted RD that MD requesting cortrak tube placement.   Spoke with pt at bedside, who was pleasant and in good spirits today. PTA pt had a great appetite. She reports she consumes 3 meals per day and generally picks very healthful items. Per pt, she does not indulge in sweets except occasional ice cream. Pt also reports snacking on nuts frequently.   Reviewed wt hx; wt has been stable over the past year. Per pt, he denies any  weight loss.   Pt able to verbalize rationale for NPO status and plan for cortrak tube placement. RD explained process and how she will receive her nutrition until swallow function allows her to take PO's safely and adequately.   Case discussed with MD via secure chat, who gave this RD permission to initiate and manage TF once tube was placed.   Medications reviewed and include 0.45% sodium chloride infusion @ 50 ml/hr.   Lab Results  Component Value Date   HGBA1C 8.5 (H) 08/21/2018   PTA DM medications are 10 mg glipizide daily and 11-20 units insulin glargine daily. Pt reports variable blood suagr control at home, which improved over the past month (premeal CBGS 80-90's and HD CBGS less than 200). Pt reports that she has struggled with DM control, especially after she was diagnosed with shingles and transitioned to insulin approximately 1-2 years ago.   Labs reviewed: CBGS: 151 (inpatient orders for glycemic control are 0-5 units insulin aspart q HS, 0-9 units insulin aspart TID with meals, ).   NUTRITION - FOCUSED PHYSICAL EXAM:    Most Recent Value  Orbital Region  No depletion  Upper Arm Region  No depletion  Thoracic and Lumbar Region  No depletion  Buccal Region  No depletion  Temple Region  No depletion  Clavicle Bone Region  No depletion  Clavicle and Acromion Bone Region  No depletion  Scapular Bone Region  No depletion  Dorsal Hand  No depletion  Patellar Region  No depletion  Anterior Thigh Region  No depletion  Posterior Calf Region  No depletion  Edema (RD Assessment)  None  Hair  Reviewed  Eyes  Reviewed  Mouth  Reviewed  Skin  Reviewed  Nails  Reviewed       Diet Order:   Diet Order            Diet NPO time specified  Diet effective now              EDUCATION NEEDS:   Education needs have been addressed  Skin:  Skin Assessment: Reviewed RN Assessment  Last BM:  Unknown  Height:   Ht Readings from Last 1 Encounters:  09/19/18 5\' 1"  (1.549 m)     Weight:   Wt Readings from Last 1 Encounters:  09/19/18 80.7 kg    Ideal Body Weight:  47.7 kg  BMI:  Body mass index is 33.63 kg/m.  Estimated Nutritional Needs:   Kcal:  1700-1900  Protein:  85-100 grams  Fluid:  > 1.7 L    Wilfrido Luedke A. Jimmye Norman, RD, LDN, [place] Registered Dietitian II Certified Diabetes Care and Education Specialist Pager: [phone] After hours Pager: [phone]

## 2018-09-20 NOTE — Progress Notes (Signed)
  Echocardiogram 2D Echocardiogram has been performed.  Wanda [name] 09/20/2018, 10:22 AM

## 2018-09-20 NOTE — Evaluation (Signed)
Physical Therapy Evaluation Patient Details Name: Wanda [name] MRN: [id number] DOB: 11-04-1940 Today's Date: 09/20/2018   History of Present Illness  Pt is a 77 yo female admitted 09/19/18 with acute issues with swallowing/choking. Pt does state she had stopped taking BP meds over last few weeks due to side effects and had some R arm parasthesias lately.  Pt found to have L medullary lacunar infarct on MRI. Pt with PMH of DM, arthritis, cataracts and HTN.    Clinical Impression  Pt with very mild balance deficits during functional gait.  PT to follow to challenge balance and provide HEP for home, but I do not anticipate she will need formal PT f/u at discharge.  She reports even compared to getting up this morning, she is feeling more steady on her feet.   PT to follow acutely for deficits listed below.    Follow Up Recommendations No PT follow up;Supervision - Intermittent    Equipment Recommendations  None recommended by PT    Recommendations for Other Services    NA    Precautions / Restrictions Precautions Precautions: Fall Precaution Comments: mild gait instability Restrictions Weight Bearing Restrictions: No      Mobility  Bed Mobility Overal bed mobility: Modified Independent             General bed mobility comments: Pt OOB in the recliner chair  Transfers Overall transfer level: Needs assistance Equipment used: None Transfers: Sit to/from Stand Sit to Stand: Supervision Stand pivot transfers: Supervision       General transfer comment: supervision for safety  Ambulation/Gait Ambulation/Gait assistance: Min guard Gait Distance (Feet): 130 Feet Assistive device: None Gait Pattern/deviations: Staggering left;Staggering right   Gait velocity interpretation: >2.62 ft/sec, indicative of community ambulatory General Gait Details: Pt with mildly staggering gait pattern, but feels she has improved even since she was last up with OT.  Min guard assist for safety.        Modified Rankin (Stroke Patients Only) Modified Rankin (Stroke Patients Only) Pre-Morbid Rankin Score: No symptoms Modified Rankin: Moderately severe disability     Balance Overall balance assessment: Needs assistance Sitting-balance support: Feet supported;No upper extremity supported Sitting balance-Leahy Scale: Good Sitting balance - Comments: able to reach down to the ground and come back up in sitting   Standing balance support: During functional activity Standing balance-Leahy Scale: Good Standing balance comment: some unsteadiness noted, but no real physical assist needed to recover balance with straight gait.                              Pertinent Vitals/Pain Pain Assessment: No/denies pain    Home Living Family/patient expects to be discharged to:: Private residence Living Arrangements: Alone Available Help at Discharge: Family;Available PRN/intermittently;Other (Comment)(said someone could stay for a couple of days if needed) Type of Home: Apartment Home Access: Level entry     Home Layout: One level Home Equipment: Cane - single point;Walker - 2 wheels Additional Comments: drives and very independent.    Prior Function Level of Independence: Independent         Comments: wears glasses to read     Hand Dominance   Dominant Hand: Right    Extremity/Trunk Assessment   Upper Extremity Assessment Upper Extremity Assessment: Defer to OT evaluation    Lower Extremity Assessment Lower Extremity Assessment: Overall WFL for tasks assessed(strenghth 5/5 equal bil, heel to shin test normal)    Cervical / Trunk Assessment  Cervical / Trunk Assessment: Normal  Communication   Communication: No difficulties  Cognition Arousal/Alertness: Awake/alert Behavior During Therapy: WFL for tasks assessed/performed Overall Cognitive Status: Within Functional Limits for tasks assessed                                         General Comments General comments (skin integrity, edema, etc.): Reviewed BEFAST as pt reports she laid down after he symptoms started to "see if they would go away"        Assessment/Plan    PT Assessment Patient needs continued PT services  PT Problem List Decreased balance;Decreased mobility;Decreased knowledge of precautions       PT Treatment Interventions DME instruction;Gait training;Stair training;Functional mobility training;Therapeutic activities;Therapeutic exercise;Balance training;Neuromuscular re-education;Cognitive remediation;Patient/family education    PT Goals (Current goals can be found in the Care Plan section)  Acute Rehab PT Goals Patient Stated Goal: to go home PT Goal Formulation: With patient/family Time For Goal Achievement: 10/04/18 Potential to Achieve Goals: Good    Frequency Min 4X/week           AM-PAC PT "6 Clicks" Mobility  Outcome Measure Help needed turning from your back to your side while in a flat bed without using bedrails?: None Help needed moving from lying on your back to sitting on the side of a flat bed without using bedrails?: None Help needed moving to and from a bed to a chair (including a wheelchair)?: A Little Help needed standing up from a chair using your arms (e.g., wheelchair or bedside chair)?: None Help needed to walk in hospital room?: A Little Help needed climbing 3-5 steps with a railing? : A Little 6 Click Score: 21    End of Session   Activity Tolerance: Patient tolerated treatment well Patient left: in chair;with family/visitor present   PT Visit Diagnosis: Unsteadiness on feet (R26.81)    Time: [id number] PT Time Calculation (min) (ACUTE ONLY): 10 min   Charges:        Wells Guiles B. Ines Warf, PT, DPT  Acute Rehabilitation [phone] pager [phone]) [phone] office  @ [facility]: [phone]   PT Evaluation $PT Eval Moderate Complexity: 1 Mod         09/20/2018, 2:08 PM

## 2018-09-20 NOTE — Evaluation (Addendum)
Clinical/Bedside Swallow Evaluation Patient Details  Name: Wanda [name] MRN: [id number] Date of Birth: [birth year]  Today's Date: 09/20/2018 Time: SLP Start Time (ACUTE ONLY): 0900 SLP Stop Time (ACUTE ONLY): 0920 SLP Time Calculation (min) (ACUTE ONLY): 20 min  Past Medical History:  Past Medical History:  Diagnosis Date  . Arthritis   . Cataract   . Diabetes mellitus   . Hypertension   . Skin cancer    Removed from face  . Urgency of urination   . Urinary leakage    Past Surgical History:  Past Surgical History:  Procedure Laterality Date  . ABDOMINAL HYSTERECTOMY  early 80's   total  . CARPAL TUNNEL RELEASE Bilateral 1980 and 1981   both hands  . EYE SURGERY     bilateral cataracts  . Fatty Tumor Excision    . JOINT REPLACEMENT    . TONSILLECTOMY    . TONSILLECTOMY AND ADENOIDECTOMY  age 58  . TOTAL KNEE ARTHROPLASTY Right 10/04/2015  . TOTAL KNEE ARTHROPLASTY Right 10/04/2015   Procedure: TOTAL KNEE ARTHROPLASTY;  Surgeon: Dorna Leitz, MD;  Location: [facility];  Service: Orthopedics;  Laterality: Right;  . TOTAL KNEE ARTHROPLASTY Left 01/28/2016   Procedure: TOTAL KNEE ARTHROPLASTY;  Surgeon: Dorna Leitz, MD;  Location: [facility];  Service: Orthopedics;  Laterality: Left;  . TUBAL LIGATION     HPI:  Wanda [name] is a 78 y.o. female past medical history of diabetes, hypertension, in her usual state of health when she went to bed on Wednesday, 09/17/2018 and woke up the next morning not feeling at her 100%.  She was not really sure what was wrong but by the end of the day, when she sat for dinner around 6 PM, she did not feel well and could not swallow her food.  This is very unusual for her and is never happened to her before.  She did not make much of it and slept.  MRI brain was obtained that showed a left lateral medullary stroke.   Assessment / Plan / Recommendation Clinical Impression  Pt demonstrates signs of orpharygneal dysphagia associated with left lateral medullary  syndrome. There is mild left facial weakness and right UE sensory weakness. Pt otherwise had good oral motor control of bolus, but is only intermittently able to initiate a swallow response with minimal trials. She has had some choking events when staff has tried to give her moderate sips of water and is quite nervous.  Pt appears to have very immediate sensation of residue and penetration/aspiration with trials today and cough is strong. She initiates a swallow spontaneously in about 25% of attempts with a moist swab/ice chips, which is a good clinical indicator for recovery of what is likley a mild impairment of the CN IX and X. Pt will need opportunities for oral hydration and sensory feedback of a bolus to initiate swallows and rehabilitate the mechanism. Advise short term non-oral nutrition with independent constant access to ice chips for swallowing opportunities. Will return daily to reassess readiness to initiate more advanced trials (larger boluses varied in texture and temperature to trigger sensory feedback) or undergo MBS as swallow response becomes more consistent.  SLP Visit Diagnosis: Dysphagia, oropharyngeal phase (R13.12)    Aspiration Risk  Risk for inadequate nutrition/hydration    Diet Recommendation Alternative means - temporary;Ice chips PRN after oral care;Other (Comment)(can allow sips if pt feels more confident)   Supervision: Intermittent supervision to cue for compensatory strategies Compensations: Slow rate;Small bites (of ice);Other (Comment)(encourage hard cough)  Other  Recommendations     Follow up Recommendations Outpatient SLP      Frequency and Duration min 3x week  2 weeks       Prognosis Prognosis for Safe Diet Advancement: Good      Swallow Study   General HPI: Wanda [name] is a 78 y.o. female past medical history of diabetes, hypertension, in her usual state of health when she went to bed on Wednesday, 09/17/2018 and woke up the next morning not  feeling at her 100%.  She was not really sure what was wrong but by the end of the day, when she sat for dinner around 6 PM, she did not feel well and could not swallow her food.  This is very unusual for her and is never happened to her before.  She did not make much of it and slept.  MRI brain was obtained that showed a left lateral medullary stroke. Type of Study: Bedside Swallow Evaluation Diet Prior to this Study: NPO Temperature Spikes Noted: No Respiratory Status: Room air History of Recent Intubation: No Behavior/Cognition: Alert;Cooperative;Pleasant mood Oral Cavity Assessment: Dry Oral Care Completed by SLP: Yes Oral Cavity - Dentition: Adequate natural dentition Self-Feeding Abilities: Able to feed self Patient Positioning: Upright in bed Baseline Vocal Quality: Normal Volitional Cough: Strong Volitional Swallow: Unable to elicit    Oral/Motor/Sensory Function Overall Oral Motor/Sensory Function: Mild impairment Facial ROM: Reduced left Facial Symmetry: Abnormal symmetry left Facial Strength: Reduced left Facial Sensation: Within Functional Limits Lingual ROM: Within Functional Limits Lingual Symmetry: Within Functional Limits Lingual Strength: Within Functional Limits Lingual Sensation: Within Functional Limits Velum: Within Functional Limits Mandible: Within Functional Limits   Ice Chips Ice chips: Impaired Presentation: Spoon Pharyngeal Phase Impairments: Throat Clearing - Immediate;Unable to trigger swallow   Thin Liquid Thin Liquid: Not tested    Nectar Thick Nectar Thick Liquid: Not tested   Honey Thick Honey Thick Liquid: Not tested   Puree Puree: Not tested   Solid     Solid: Not tested     Herbie [place], MA CCC-SLP  Acute Rehabilitation Services Pager [phone] Office [phone]  Lynann Beaver 09/20/2018,11:06 AM

## 2018-09-21 DIAGNOSIS — R1311 Dysphagia, oral phase: Secondary | ICD-10-CM

## 2018-09-21 DIAGNOSIS — I6523 Occlusion and stenosis of bilateral carotid arteries: Secondary | ICD-10-CM

## 2018-09-21 LAB — GLUCOSE, CAPILLARY
Glucose-Capillary: 265 mg/dL — ABNORMAL HIGH (ref 70–99)
Glucose-Capillary: 262 mg/dL — ABNORMAL HIGH (ref 70–99)
Glucose-Capillary: 245 mg/dL — ABNORMAL HIGH (ref 70–99)
Glucose-Capillary: 283 mg/dL — ABNORMAL HIGH (ref 70–99)
Glucose-Capillary: 176 mg/dL — ABNORMAL HIGH (ref 70–99)

## 2018-09-21 LAB — MAGNESIUM
Magnesium: 1.6 mg/dL — ABNORMAL LOW (ref 1.7–2.4)
Magnesium: 1.9 mg/dL (ref 1.7–2.4)

## 2018-09-21 LAB — HEMOGLOBIN A1C
Hgb A1c MFr Bld: 8 % — ABNORMAL HIGH (ref 4.8–5.6)
Mean Plasma Glucose: 183 mg/dL

## 2018-09-21 LAB — PHOSPHORUS
Phosphorus: 3.3 mg/dL (ref 2.5–4.6)
Phosphorus: 3.3 mg/dL (ref 2.5–4.6)

## 2018-09-21 MED ORDER — ROSUVASTATIN CALCIUM 5 MG PO TABS
10.0000 mg | ORAL_TABLET | Freq: Every day | ORAL | Status: DC
  Administered 2018-09-21 – 2018-09-22 (×2): 10 mg via ORAL
  Filled 2018-09-21 (×2): qty 2

## 2018-09-21 MED ORDER — INSULIN ASPART 100 UNIT/ML ~~LOC~~ SOLN
0.0000 [IU] | Freq: Three times a day (TID) | SUBCUTANEOUS | Status: DC
  Administered 2018-09-21: 18:00:00 8 [IU] via SUBCUTANEOUS
  Administered 2018-09-22: 11 [IU] via SUBCUTANEOUS
  Administered 2018-09-22: 5 [IU] via SUBCUTANEOUS
  Administered 2018-09-22: 10:00:00 11 [IU] via SUBCUTANEOUS
  Administered 2018-09-23: 13:00:00 3 [IU] via SUBCUTANEOUS
  Administered 2018-09-23: 15 [IU] via SUBCUTANEOUS

## 2018-09-21 MED ORDER — INSULIN ASPART 100 UNIT/ML ~~LOC~~ SOLN
0.0000 [IU] | Freq: Every day | SUBCUTANEOUS | Status: DC
  Administered 2018-09-22: 3 [IU] via SUBCUTANEOUS

## 2018-09-21 MED ORDER — BACLOFEN 1 MG/ML ORAL SUSPENSION
10.0000 mg | Freq: Three times a day (TID) | ORAL | Status: DC
  Filled 2018-09-21: qty 1

## 2018-09-21 MED ORDER — MAGNESIUM SULFATE 2 GM/50ML IV SOLN
2.0000 g | Freq: Once | INTRAVENOUS | Status: AC
  Administered 2018-09-21: 2 g via INTRAVENOUS
  Filled 2018-09-21: qty 50

## 2018-09-21 MED ORDER — MAGNESIUM SULFATE 4 GM/100ML IV SOLN
4.0000 g | Freq: Once | INTRAVENOUS | Status: DC
  Filled 2018-09-21: qty 100

## 2018-09-21 NOTE — Progress Notes (Signed)
Called report to 3W charge RN.

## 2018-09-21 NOTE — Progress Notes (Signed)
  Speech Language Pathology Treatment: Dysphagia  Patient Details Name: Wanda [name] MRN: [id number] DOB: 1940/11/25 Today's Date: 09/21/2018 Time: [id number] SLP Time Calculation (min) (ACUTE ONLY): 22 min  Assessment / Plan / Recommendation Clinical Impression  Pt is demonstrating improvements in swallow function.  Currently has cortrak; she has been swallowing ice chips per SLP's recommendations after yesterday's swallow assessment.  Spent time talking with pt/daughter about neuropathology and the importance of the lateral medulla for mediating swallowing, the value of having intact sensation.  Pt able to consume ice chips and teaspoons of water with no s/s of aspiration.  She acknowledges improvements since yesterday.  Trials of puree were not swallowed as effectively, and pt could sense retention in her throat.  Recommend that pt continue ice chips/sips of water; no solids; maintain NG for nutrition.  SLP will continue to follow along - anticipate continued improvements given size small of infarct (5-6 mm) and progress last 24 hours.  D/W RN and Dr. Cyndia Skeeters.  SLP will continue to follow.    HPI HPI: Wanda [name] is a 78 y.o. female past medical history of diabetes, hypertension, in her usual state of health when she went to bed on Wednesday, 09/17/2018 and woke up the next morning not feeling at her 100%.  She was not really sure what was wrong but by the end of the day, when she sat for dinner around 6 PM, she did not feel well and could not swallow her food.  This is very unusual for her and is never happened to her before.  She did not make much of it and slept.  MRI brain was obtained that showed a left lateral medullary stroke.      SLP Plan  Continue with current plan of care       Recommendations  Diet recommendations: Thin liquid Liquids provided via: Cup;Straw Medication Administration: Via alternative means Supervision: Patient able to self feed Compensations: Slow rate;Small  sips/bites Postural Changes and/or Swallow Maneuvers: Seated upright 90 degrees                Oral Care Recommendations: Oral care BID Follow up Recommendations: Outpatient SLP SLP Visit Diagnosis: Dysphagia, oropharyngeal phase (R13.12) Plan: Continue with current plan of care       GO              Wanda [name], [place] CCC/SLP Acute Rehabilitation Services Office number [phone] Pager [phone]   Wanda [name] 09/21/2018, 12:49 PM

## 2018-09-21 NOTE — Progress Notes (Signed)
PROGRESS NOTE  Wanda [name] [id number] DOB: [birth year]   PCP: Darreld Mclean, MD  Patient is from: Home  DOA: 09/19/2018 LOS: 2  Brief Narrative / Interim history: 78 year old female with history of DM-2, HTN, HLD, goiter and medication noncompliance presenting with dysphagia, choking episode and right arm paresthesia for 2 days and found to have small left lateral medullary infarct on MRI.  Neurology guiding management. Failed swallow eval. Plan is to place Cortrack temporarily.   Subjective: No major overnight of this morning.  No complaint this morning other than some discomfort with the cortrak.  She denies headache, vision change, difficulty speaking, focal weakness, numbness or tingling.  Still with loss of pain sensation in her right arm.  Reports history of statin intolerance in the past.  She states she had some weakness and pain in the legs but willing to give it a try starting at low-dose.  Objective: Vitals:   09/21/18 0900 09/21/18 1000 09/21/18 1047 09/21/18 1115  BP: 139/63 (!) 170/72  (!) 173/82  Pulse: 91 83 87   Resp: 16 19 15 16   Temp:      TempSrc:      SpO2: 94% 94% 99%   Weight:      Height:        Intake/Output Summary (Last 24 hours) at 09/21/2018 1155 Last data filed at 09/21/2018 1100 Gross per 24 hour  Intake 2112 ml  Output -  Net 2112 ml   Filed Weights   09/19/18 0633 09/21/18 0644  Weight: 80.7 kg 76.4 kg    Examination:  GENERAL: No acute distress.  Appears well.  HEENT: MMM.  Vision and hearing grossly intact. CorTrak in place. NECK: Supple.  No apparent JVD.  RESP:  No IWOB. Good air movement bilaterally. CVS:  RRR. Heart sounds normal.  ABD/GI/GU: Bowel sounds present. Soft. Non tender.  MSK/EXT:  Moves extremities. No apparent deformity or edema.  SKIN: no apparent skin lesion or wound NEURO: Awake, alert and oriented appropriately.  Neuro exam intact except for diminished pain sensation in right arm.Marland Kitchen PSYCH: Calm. Normal  affect.   Assessment & Plan: Acute ischemic left lateral medullary stroke -Symptoms include dysphagia, diminished sensation to noxious stimuli in her RUE and slight left facial droop -CT head without contrast negative.  MRI brain revealed acute left lateral medullary infarct. -CTA head and neck with bilateral ICA, VA and subclavian artery stenosis -Echocardiogram not impressive. -Plavix and statin per Cortrack.  Allergic to aspirin. -Permissive hypertension.  Long-term SBP goal 130-150. -Failed swallow eval by SLP-cortrack to be placed. -PT/OT eval-no need identified. -Neurology signed off.  Essential hypertension: recently stopped amlodipine and HCTZ due to "interference with my blood glucose" -Permissive hypertension for now  Uncontrolled IDDM-2 with hyperglycemia: A1c 8.6%.  On insulin and glipizide at home.  CBG elevated -Increase SSI to moderate -CBG monitoring -Continue statin  Hyperlipidemia: LDL 158. -Continue high intensity statin  History of goiter: Not on any medication.  TSH within normal range in 08/2018. -Continue monitoring  Obesity: BMI 33.63 -Encourage lifestyle change to lose weight.  Hypomagnesemia -Replenish and recheck  DVT prophylaxis: Subcu Lovenox Code Status: Full code Family Communication: Patient and/or RN. Available if any question.  Disposition Plan: Remains inpatient due to dysphagia requiring CorTrak. Consultants: Neurology (signed off)  Procedures:  None  Microbiology summarized: SARS-CoV-2 screen negative.  Antimicrobials: Anti-infectives (From admission, onward)   None      Sch Meds:  Scheduled Meds: .  stroke: mapping our early stages of  recovery book   Does not apply Once  . clopidogrel  75 mg Oral Daily  . enoxaparin (LOVENOX) injection  40 mg Subcutaneous Q24H  . insulin aspart  0-9 Units Subcutaneous Q4H  . rosuvastatin  10 mg Oral q1800   Continuous Infusions: . sodium chloride Stopped (09/21/18 1046)  . feeding  supplement (JEVITY 1.2 CAL) 65 mL/hr at 09/21/18 1100   PRN Meds:.acetaminophen **OR** acetaminophen (TYLENOL) oral liquid 160 mg/5 mL **OR** acetaminophen   I have personally reviewed the following labs and images: CBC: Recent Labs  Lab 09/19/18 0739 09/19/18 0747  WBC 5.6  --   NEUTROABS 3.9  --   HGB 14.0 14.3  HCT 41.8 42.0  MCV 89.9  --   PLT 238  --    BMP &GFR Recent Labs  Lab 09/19/18 0739 09/19/18 0747 09/20/18 1525 09/21/18 0444  NA 139 142  --   --   K 4.0 4.0  --   --   CL 106 108  --   --   CO2 23  --   --   --   GLUCOSE 216* 212*  --   --   BUN 11 13  --   --   CREATININE 0.77 0.70  --   --   CALCIUM 9.8  --   --   --   MG  --   --  1.7 1.6*  PHOS  --   --  2.9 3.3   Estimated Creatinine Clearance: 54.2 mL/min (by C-G formula based on SCr of 0.7 mg/dL). Liver & Pancreas: Recent Labs  Lab 09/19/18 0739  AST 18  ALT 16  ALKPHOS 44  BILITOT 0.6  PROT 7.3  ALBUMIN 4.2   No results for input(s): LIPASE, AMYLASE in the last 168 hours. No results for input(s): AMMONIA in the last 168 hours. Diabetic: Recent Labs    09/20/18 0625  HGBA1C 8.0*   Recent Labs  Lab 09/20/18 1529 09/20/18 1944 09/20/18 2337 09/21/18 0345 09/21/18 0815  GLUCAP 119* 191* 294* 265* 262*   Cardiac Enzymes: No results for input(s): CKTOTAL, CKMB, CKMBINDEX, TROPONINI in the last 168 hours. No results for input(s): PROBNP in the last 8760 hours. Coagulation Profile: Recent Labs  Lab 09/19/18 0739  INR 1.0   Thyroid Function Tests: No results for input(s): TSH, T4TOTAL, FREET4, T3FREE, THYROIDAB in the last 72 hours. Lipid Profile: Recent Labs    09/20/18 0625  CHOL 236*  HDL 34*  LDLCALC 158*  TRIG 222*  CHOLHDL 6.9   Anemia Panel: No results for input(s): VITAMINB12, FOLATE, FERRITIN, TIBC, IRON, RETICCTPCT in the last 72 hours. Urine analysis:    Component Value Date/Time   COLORURINE YELLOW 09/19/2018 0758   APPEARANCEUR CLEAR 09/19/2018 0758    LABSPEC 1.010 09/19/2018 0758   PHURINE 6.0 09/19/2018 0758   GLUCOSEU NEGATIVE 09/19/2018 0758   HGBUR NEGATIVE 09/19/2018 0758   BILIRUBINUR NEGATIVE 09/19/2018 0758   BILIRUBINUR neg 07/18/2012 [place] 09/19/2018 0758   PROTEINUR NEGATIVE 09/19/2018 0758   UROBILINOGEN 0.2 07/18/2012 0938   NITRITE NEGATIVE 09/19/2018 0758   LEUKOCYTESUR NEGATIVE 09/19/2018 0758   Sepsis Labs: Invalid input(s): PROCALCITONIN, [place]  Microbiology: Recent Results (from the past 240 hour(s))  SARS CORONAVIRUS 2 (TAT 6-24 HRS) Nasopharyngeal Nasopharyngeal Swab     Status: None   Collection Time: 09/19/18  1:00 PM   Specimen: Nasopharyngeal Swab  Result Value Ref Range Status   SARS Coronavirus 2 NEGATIVE NEGATIVE Final  Comment: (NOTE) SARS-CoV-2 target nucleic acids are NOT DETECTED. The SARS-CoV-2 RNA is generally detectable in upper and lower respiratory specimens during the acute phase of infection. Negative results do not preclude SARS-CoV-2 infection, do not rule out co-infections with other pathogens, and should not be used as the sole basis for treatment or other patient management decisions. Negative results must be combined with clinical observations, patient history, and epidemiological information. The expected result is Negative. Fact Sheet for Patients: SugarRoll.be Fact Sheet for Healthcare Providers: https://www.woods-mathews.com/ This test is not yet approved or cleared by the [place] FDA and  has been authorized for detection and/or diagnosis of SARS-CoV-2 by FDA under an Emergency Use Authorization (EUA). This EUA will remain  in effect (meaning this test can be used) for the duration of the COVID-19 declaration under Section 56 4(b)(1) of the Act, 21 U.S.C. section 360bbb-3(b)(1), unless the authorization is terminated or revoked sooner. Performed at [facility], [place] [address],  [place], [place] [address]     Radiology Studies: No results found.  Taye T. [place]  If 7PM-7AM, please contact night-coverage [email or web address] Password [facility] 09/21/2018, 11:55 AM

## 2018-09-21 NOTE — Progress Notes (Addendum)
Physical Therapy Treatment & Discharge Patient Details Name: Wanda [name] MRN: [id number] DOB: [birth year] Today's Date: 09/21/2018    History of Present Illness Pt is a 78 yo female admitted 09/19/18 with acute issues with swallowing/choking. Pt does state she had stopped taking BP meds over last few weeks due to side effects and had some R arm parasthesias lately.  Pt found to have L medullary lacunar infarct on MRI. Cortrak placed 9/11 due to failed swallow eval. PMH of DM, arthritis, cataracts and HTN.   PT Comments    Pt progressing well with mobility. Independent with transfers, ambulation and higher level balance tasks; daughter present and reports pt ambulating at baseline. Dynamic Gait Index score of 20/24 indicates decreased risk for falls with higher level balance activities. Pt planning to d/c home with initial assist from daughter. Pt has met short-term acute PT goals; has no further questions or concerns. Encouraged continued ambulation with family and/or nursing staff. Will d/c acute PT.    Follow Up Recommendations  No PT follow up;Supervision - Intermittent     Equipment Recommendations  None recommended by PT    Recommendations for Other Services       Precautions / Restrictions Precautions Precautions: Fall Precaution Comments: Cortrak Restrictions Weight Bearing Restrictions: No    Mobility  Bed Mobility               General bed mobility comments: Pt OOB in the recliner chair  Transfers Overall transfer level: Independent Equipment used: None                Ambulation/Gait Ambulation/Gait assistance: Independent Gait Distance (Feet): 300 Feet Assistive device: None Gait Pattern/deviations: Step-through pattern;Decreased stride length Gait velocity: Decreased Gait velocity interpretation: 1.31 - 2.62 ft/sec, indicative of limited community ambulator General Gait Details: Slow, steady gait independent; pt with shorter step length, daughter  reports gait mechanics are baseline   Stairs             Wheelchair Mobility    Modified Rankin (Stroke Patients Only) Modified Rankin (Stroke Patients Only) Pre-Morbid Rankin Score: No symptoms Modified Rankin: Slight disability     Balance Overall balance assessment: Needs assistance   Sitting balance-Leahy Scale: Good       Standing balance-Leahy Scale: Good                   Standardized Balance Assessment Standardized Balance Assessment : Dynamic Gait Index   Dynamic Gait Index Level Surface: Normal Change in Gait Speed: Normal Gait with Horizontal Head Turns: Mild Impairment Gait with Vertical Head Turns: Normal Gait and Pivot Turn: Mild Impairment Step Over Obstacle: Mild Impairment Step Around Obstacles: Normal Steps: Mild Impairment Total Score: 20      Cognition Arousal/Alertness: Awake/alert Behavior During Therapy: WFL for tasks assessed/performed Overall Cognitive Status: Within Functional Limits for tasks assessed                                        Exercises      General Comments        Pertinent Vitals/Pain Pain Assessment: No/denies pain(only irritation with cortrak)    Home Living                      Prior Function            PT Goals (current goals can now be found in  the care plan section) Progress towards PT goals: Goals met/education completed, patient discharged from PT    Frequency    Min 4X/week      PT Plan Current plan remains appropriate    Co-evaluation              AM-PAC PT "6 Clicks" Mobility   Outcome Measure  Help needed turning from your back to your side while in a flat bed without using bedrails?: None Help needed moving from lying on your back to sitting on the side of a flat bed without using bedrails?: None Help needed moving to and from a bed to a chair (including a wheelchair)?: None Help needed standing up from a chair using your arms (e.g.,  wheelchair or bedside chair)?: None Help needed to walk in hospital room?: None Help needed climbing 3-5 steps with a railing? : None 6 Click Score: 24    End of Session   Activity Tolerance: Patient tolerated treatment well Patient left: in chair;with family/visitor present Nurse Communication: Mobility status PT Visit Diagnosis: Unsteadiness on feet (R26.81)     Time: 1140-1156 PT Time Calculation (min) (ACUTE ONLY): 16 min  Charges:  $Gait Training: 8-22 mins                    Mabeline Caras, PT, DPT Acute Rehabilitation Services  Pager [phone] Office [place] 09/21/2018, 12:28 PM

## 2018-09-22 DIAGNOSIS — I10 Essential (primary) hypertension: Secondary | ICD-10-CM

## 2018-09-22 LAB — GLUCOSE, CAPILLARY
Glucose-Capillary: 236 mg/dL — ABNORMAL HIGH (ref 70–99)
Glucose-Capillary: 238 mg/dL — ABNORMAL HIGH (ref 70–99)
Glucose-Capillary: 264 mg/dL — ABNORMAL HIGH (ref 70–99)
Glucose-Capillary: 339 mg/dL — ABNORMAL HIGH (ref 70–99)
Glucose-Capillary: 339 mg/dL — ABNORMAL HIGH (ref 70–99)
Glucose-Capillary: 347 mg/dL — ABNORMAL HIGH (ref 70–99)

## 2018-09-22 MED ORDER — INSULIN GLARGINE 100 UNIT/ML ~~LOC~~ SOLN
10.0000 [IU] | Freq: Two times a day (BID) | SUBCUTANEOUS | Status: DC
  Administered 2018-09-22 – 2018-09-23 (×3): 10 [IU] via SUBCUTANEOUS
  Filled 2018-09-22 (×4): qty 0.1

## 2018-09-22 MED ORDER — AMLODIPINE BESYLATE 10 MG PO TABS
10.0000 mg | ORAL_TABLET | Freq: Every day | ORAL | Status: DC
  Administered 2018-09-22 – 2018-09-23 (×2): 10 mg via ORAL
  Filled 2018-09-22 (×2): qty 1

## 2018-09-22 MED ORDER — HYDROCHLOROTHIAZIDE 10 MG/ML ORAL SUSPENSION
25.0000 mg | Freq: Every day | ORAL | Status: DC
  Administered 2018-09-22: 11:00:00 25 mg
  Filled 2018-09-22 (×2): qty 2.5

## 2018-09-22 NOTE — Progress Notes (Signed)
PROGRESS NOTE  Wanda [name] [id number] DOB: 08-20-1940   PCP: Darreld Mclean, MD  Patient is from: Home  DOA: 09/19/2018 LOS: 3  Brief Narrative / Interim history: 78 year old female with history of DM-2, HTN, HLD, goiter and medication noncompliance presenting with sudden onset of dysphagia; choking episode and right arm paresthesia.  Patient was found to have small left lateral medullary infarct on MRI.  CTA of the brain and neck revealed bilateral carotid artery stenosis, patient will follow with the vascular surgery team on discharge.  Patient continues to have difficulty swallowing, though, improving.  Cartrack tube has been placed.  Speech therapy team is following patient.  Hopefully, placement of the PEG tube can be avoided if patient continues to improve.  Blood sugar is uncontrolled.  Will change Jevity 1.2 to Glucerna.  We will reconsult the dietary team further change if they are available today.  Will restart patient's subcutaneous Lantus at 10 units twice daily.  Apparently, patient was on subcutaneous Lantus 16 units every night prior to admission, but compliance has been in question.  Due to permissive hypertension, patient's antihypertensives have been on hold.  Will gradually restart blood pressure control.  Hold systolic blood pressure is as advised by the neurology team, 130 to 150 mmHg due to carotid artery stenosis.  Patient is on Plavix.  History of aspirin allergy is documented.  We will continue statin.  Further management depend on hospital course.  Subjective: No new complaints. Dysphagia continues.  Objective: Vitals:   09/21/18 2339 09/22/18 0347 09/22/18 0500 09/22/18 0757  BP: (!) 181/80 (!) 184/77  (!) 189/82  Pulse: 82 83  81  Resp: 15 15  16   Temp: 97.9 F (36.6 C) 98.2 F (36.8 C)  (!) 97.5 F (36.4 C)  TempSrc: Oral Oral  Oral  SpO2: 99% 94%  97%  Weight:   81.8 kg   Height:        Intake/Output Summary (Last 24 hours) at 09/22/2018 1001  Last data filed at 09/22/2018 0741 Gross per 24 hour  Intake 2006.68 ml  Output -  Net 2006.68 ml   Filed Weights   09/19/18 0633 09/21/18 0644 09/22/18 0500  Weight: 80.7 kg 76.4 kg 81.8 kg    Examination:  GENERAL: No acute distress.  Appears well.  HEENT: No pallor or jaundice.  CorTrak in place. NECK: Supple.  No apparent JVD.  RESP: Clear to auscultation. CVS:  RRR. Heart sounds normal.  ABD/GI/GU: Obese abdomen, soft and nontender.   NEURO: Awake, alert and oriented appropriately.  Patient moves all extremities.     Assessment & Plan: Acute ischemic left lateral medullary stroke: -Symptoms include dysphagia, diminished sensation to noxious stimuli in her RUE and slight left facial droop -CT head without contrast negative.  MRI brain revealed acute left lateral medullary infarct. -CTA head and neck with bilateral ICA, VA and subclavian artery stenosis -Echocardiogram not impressive. -Plavix and statin per Cortrack.  Allergic to aspirin. -Permissive hypertension.  Long-term SBP goal 130-150. -Failed swallow eval by SLP-cortrack to be placed. -PT/OT eval-no need identified. -Neurology signed off. 09/22/2018: Currently see documentation above.  Start Norvasc and HCTZ.  Monitor blood pressure closely.  Essential hypertension:  -Gradually restart antihypertensives.  Uncontrolled IDDM-2 with hyperglycemia: A1c 8.6%.  On insulin and glipizide at home.  CBG elevated -Increase SSI to moderate -CBG monitoring -Restart subcutaneous Lantus at 10 units twice daily. -Change Jevity 1.2 tube feeds to Glucerna.  Hyperlipidemia: LDL 158. -Continue high intensity statin  History  of goiter: Not on any medication.  TSH within normal range in 08/2018. -Continue monitoring  Obesity: BMI 33.63 -Encourage lifestyle change to lose weight.  Hypomagnesemia -Replenish and recheck  DVT prophylaxis: Subcu Lovenox Code Status: Full code Family Communication:  Disposition Plan: Remains  inpatient due to dysphagia requiring CorTrak.  Hopefully, will be able to avoid PEG tube if dysphagia resolves. Consultants: Neurology (signed off)  Procedures:  None  Microbiology summarized: SARS-CoV-2 screen negative.  Antimicrobials: Anti-infectives (From admission, onward)   None      Sch Meds:  Scheduled Meds: .  stroke: mapping our early stages of recovery book   Does not apply Once  . clopidogrel  75 mg Oral Daily  . enoxaparin (LOVENOX) injection  40 mg Subcutaneous Q24H  . insulin aspart  0-15 Units Subcutaneous TID WC  . insulin aspart  0-5 Units Subcutaneous QHS  . rosuvastatin  10 mg Oral q1800   Continuous Infusions: . sodium chloride 50 mL/hr at 09/22/18 0741  . feeding supplement (JEVITY 1.2 CAL) 1,000 mL (09/22/18 0527)   PRN Meds:.acetaminophen **OR** acetaminophen (TYLENOL) oral liquid 160 mg/5 mL **OR** acetaminophen   I have personally reviewed the following labs and images: CBC: Recent Labs  Lab 09/19/18 0739 09/19/18 0747  WBC 5.6  --   NEUTROABS 3.9  --   HGB 14.0 14.3  HCT 41.8 42.0  MCV 89.9  --   PLT 238  --    BMP &GFR Recent Labs  Lab 09/19/18 0739 09/19/18 0747 09/20/18 1525 09/21/18 0444 09/21/18 1657  NA 139 142  --   --   --   K 4.0 4.0  --   --   --   CL 106 108  --   --   --   CO2 23  --   --   --   --   GLUCOSE 216* 212*  --   --   --   BUN 11 13  --   --   --   CREATININE 0.77 0.70  --   --   --   CALCIUM 9.8  --   --   --   --   MG  --   --  1.7 1.6* 1.9  PHOS  --   --  2.9 3.3 3.3   Estimated Creatinine Clearance: 56.2 mL/min (by C-G formula based on SCr of 0.7 mg/dL). Liver & Pancreas: Recent Labs  Lab 09/19/18 0739  AST 18  ALT 16  ALKPHOS 44  BILITOT 0.6  PROT 7.3  ALBUMIN 4.2   No results for input(s): LIPASE, AMYLASE in the last 168 hours. No results for input(s): AMMONIA in the last 168 hours. Diabetic: Recent Labs    09/20/18 0625  HGBA1C 8.0*   Recent Labs  Lab 09/21/18 1615 09/21/18  2201 09/21/18 2359 09/22/18 0344 09/22/18 0947  GLUCAP 283* 176* 238* 339* 347*   Cardiac Enzymes: No results for input(s): CKTOTAL, CKMB, CKMBINDEX, TROPONINI in the last 168 hours. No results for input(s): PROBNP in the last 8760 hours. Coagulation Profile: Recent Labs  Lab 09/19/18 0739  INR 1.0   Thyroid Function Tests: No results for input(s): TSH, T4TOTAL, FREET4, T3FREE, THYROIDAB in the last 72 hours. Lipid Profile: Recent Labs    09/20/18 0625  CHOL 236*  HDL 34*  LDLCALC 158*  TRIG 222*  CHOLHDL 6.9   Anemia Panel: No results for input(s): VITAMINB12, FOLATE, FERRITIN, TIBC, IRON, RETICCTPCT in the last 72 hours. Urine analysis:  Component Value Date/Time   COLORURINE YELLOW 09/19/2018 0758   APPEARANCEUR CLEAR 09/19/2018 0758   LABSPEC 1.010 09/19/2018 0758   PHURINE 6.0 09/19/2018 0758   GLUCOSEU NEGATIVE 09/19/2018 0758   HGBUR NEGATIVE 09/19/2018 0758   BILIRUBINUR NEGATIVE 09/19/2018 0758   BILIRUBINUR neg 07/18/2012 0938   KETONESUR NEGATIVE 09/19/2018 0758   PROTEINUR NEGATIVE 09/19/2018 0758   UROBILINOGEN 0.2 07/18/2012 0938   NITRITE NEGATIVE 09/19/2018 0758   LEUKOCYTESUR NEGATIVE 09/19/2018 0758   Sepsis Labs: Invalid input(s): PROCALCITONIN, [place]  Microbiology: Recent Results (from the past 240 hour(s))  SARS CORONAVIRUS 2 (TAT 6-24 HRS) Nasopharyngeal Nasopharyngeal Swab     Status: None   Collection Time: 09/19/18  1:00 PM   Specimen: Nasopharyngeal Swab  Result Value Ref Range Status   SARS Coronavirus 2 NEGATIVE NEGATIVE Final    Comment: (NOTE) SARS-CoV-2 target nucleic acids are NOT DETECTED. The SARS-CoV-2 RNA is generally detectable in upper and lower respiratory specimens during the acute phase of infection. Negative results do not preclude SARS-CoV-2 infection, do not rule out co-infections with other pathogens, and should not be used as the sole basis for treatment or other patient management decisions.  Negative results must be combined with clinical observations, patient history, and epidemiological information. The expected result is Negative. Fact Sheet for Patients: SugarRoll.be Fact Sheet for Healthcare Providers: https://www.woods-mathews.com/ This test is not yet approved or cleared by the [place] FDA and  has been authorized for detection and/or diagnosis of SARS-CoV-2 by FDA under an Emergency Use Authorization (EUA). This EUA will remain  in effect (meaning this test can be used) for the duration of the COVID-19 declaration under Section 56 4(b)(1) of the Act, 21 U.S.C. section 360bbb-3(b)(1), unless the authorization is terminated or revoked sooner. Performed at [facility], [place] [address], [place], [place] [address]     Radiology Studies: No results found.  Bonnell Public, M.D. [facility]  If 7PM-7AM, please contact night-coverage [email or web address] Password [email or web address] 09/22/2018, 10:01 AM

## 2018-09-22 NOTE — Progress Notes (Signed)
  Speech Language Pathology Treatment: Dysphagia  Patient Details Name: Wanda [name] MRN: [id number] DOB: 1940-06-09 Today's Date: 09/22/2018 Time: 1200-1212 SLP Time Calculation (min) (ACUTE ONLY): 12 min  Assessment / Plan / Recommendation Clinical Impression  Pt demonstrates ongoing improvement in ability to consistently initiate swallow. Today pt was able to intiate a swallow in 100 % of trials attempted, as well as consecutive and multiple swallows. She is still throat clearing a bit, but we both feel it is most likely due to NG tube sensation. Under supervision pt was able to consume ice, sips of water, sips of ensure and bites of puree with slight intermittent throat clearing. Recommend pt initiate a full liquid diet today and SLP will f/u tomorrow. If pt tolerates PO would consider removing NG tube and trialing regular solids. If there is ongoing difficulty pt would benefit from instrumental assessment.   HPI HPI: Wanda [name] is a 78 y.o. female past medical history of diabetes, hypertension, in her usual state of health when she went to bed on Wednesday, 09/17/2018 and woke up the next morning not feeling at her 100%.  She was not really sure what was wrong but by the end of the day, when she sat for dinner around 6 PM, she did not feel well and could not swallow her food.  This is very unusual for her and is never happened to her before.  She did not make much of it and slept.  MRI brain was obtained that showed a left lateral medullary stroke.      SLP Plan  Continue with current plan of care       Recommendations  Diet recommendations: Thin liquid;Dysphagia 1 (puree) Liquids provided via: Straw;Cup Medication Administration: Via alternative means Supervision: Patient able to self feed Compensations: Slow rate;Small sips/bites Postural Changes and/or Swallow Maneuvers: Seated upright 90 degrees                Oral Care Recommendations: Oral care BID Follow up  Recommendations: Outpatient SLP SLP Visit Diagnosis: Dysphagia, oropharyngeal phase (R13.12) Plan: Continue with current plan of care       GO               Herbie Baltimore, MA [place] Pager [phone] Office [phone]  Lynann Beaver 09/22/2018, 12:43 PM

## 2018-09-23 ENCOUNTER — Encounter (HOSPITAL_COMMUNITY): Payer: Self-pay

## 2018-09-23 ENCOUNTER — Inpatient Hospital Stay (HOSPITAL_COMMUNITY): Payer: Medicare Other

## 2018-09-23 LAB — GLUCOSE, CAPILLARY
Glucose-Capillary: 276 mg/dL — ABNORMAL HIGH (ref 70–99)
Glucose-Capillary: 330 mg/dL — ABNORMAL HIGH (ref 70–99)
Glucose-Capillary: 362 mg/dL — ABNORMAL HIGH (ref 70–99)
Glucose-Capillary: 168 mg/dL — ABNORMAL HIGH (ref 70–99)
Glucose-Capillary: 173 mg/dL — ABNORMAL HIGH (ref 70–99)

## 2018-09-23 LAB — RENAL FUNCTION PANEL
Albumin: 3.7 g/dL (ref 3.5–5.0)
Anion gap: 10 (ref 5–15)
BUN: 15 mg/dL (ref 8–23)
CO2: 28 mmol/L (ref 22–32)
Calcium: 10 mg/dL (ref 8.9–10.3)
Chloride: 101 mmol/L (ref 98–111)
Creatinine, Ser: 0.86 mg/dL (ref 0.44–1.00)
Glucose, Bld: 342 mg/dL — ABNORMAL HIGH (ref 70–99)
Phosphorus: 4 mg/dL (ref 2.5–4.6)
Potassium: 4 mmol/L (ref 3.5–5.1)
Sodium: 139 mmol/L (ref 135–145)

## 2018-09-23 LAB — MAGNESIUM: Magnesium: 1.6 mg/dL — ABNORMAL LOW (ref 1.7–2.4)

## 2018-09-23 MED ORDER — GLUCERNA SHAKE PO LIQD: 237.0000 mL | Freq: Two times a day (BID) | ORAL | Status: DC

## 2018-09-23 MED ORDER — CLOPIDOGREL BISULFATE 75 MG PO TABS: 75.0000 mg | ORAL_TABLET | Freq: Every day | ORAL | 0 refills | Status: DC

## 2018-09-23 MED ORDER — HYDROCHLOROTHIAZIDE 25 MG PO TABS
25.0000 mg | ORAL_TABLET | Freq: Every day | ORAL | Status: DC
  Administered 2018-09-23: 25 mg via ORAL
  Filled 2018-09-23: qty 1

## 2018-09-23 MED ORDER — ROSUVASTATIN CALCIUM 10 MG PO TABS: 10.0000 mg | ORAL_TABLET | Freq: Every day | ORAL | 0 refills | Status: DC

## 2018-09-23 NOTE — Progress Notes (Signed)
Nutrition Follow-up   RD working remotely.  DOCUMENTATION CODES:   Obesity unspecified  INTERVENTION:  Provide Glucerna Shake po BID, each supplement provides 220 kcal and 10 grams of protein  Encourage adequate PO intake.   NUTRITION DIAGNOSIS:   Inadequate oral intake related to inability to eat as evidenced by NPO status; diet advanced; improving  GOAL:   Patient will meet greater than or equal to 90% of their needs; progressing  MONITOR:   PO intake, Weight trends, Supplement acceptance, Skin, Labs, I & O's  REASON FOR ASSESSMENT:   Other (Comment)    ASSESSMENT:   Wanda [name]  is a 77 y.o. female, with past medical history significant for diabetes mellitus and hypertension, noncompliant with her blood pressure medications presenting with 2 days history of problems swallowing and choking.  No headaches, shortness of breath, nausea , vomiting or diarrhea .   Patient admits that she has not taken her blood pressure medicine for the last couple of months due to side effects and did not follow-up with her PMD.  Patient also reports that she has been having problems with right arm paresthesias and decreased sensationsIn the emergency room MRI showed acute small left medullary lacunar infarct.  Blood work was normal. Pt admitted with CVA/ small lt medullary infarct with resulting dysphagia.   Diet has been advanced to a regular diet with thin liquids. Cortrak NGT has been removed. Tube feeding discontinued. RD to order nutritional supplements to aid in caloric and protein needs. Plans for discharge home.    Labs and medications reviewed.   Diet Order:   Diet Order            Diet regular Room service appropriate? Yes; Fluid consistency: Thin  Diet effective now        Diet - low sodium heart healthy              EDUCATION NEEDS:   Education needs have been addressed  Skin:  Skin Assessment: Reviewed RN Assessment  Last BM:  9/13  Height:   Ht Readings from  Last 1 Encounters:  09/19/18 5\' 1"  (1.549 m)    Weight:   Wt Readings from Last 1 Encounters:  09/23/18 82.3 kg    Ideal Body Weight:  47.7 kg  BMI:  Body mass index is 34.28 kg/m.  Estimated Nutritional Needs:   Kcal:  1700-1900  Protein:  85-100 grams  Fluid:  > 1.7 L    Corrin Parker, MS, RD, LDN Pager # [phone] After hours/ weekend pager # [phone]

## 2018-09-23 NOTE — Discharge Summary (Signed)
Physician Discharge Summary  Patient ID: Wanda [name] MRN: [id number] DOB/AGE: 09/04/1940 78 y.o.  Admit date: 09/19/2018 Discharge date: 09/23/2018  Admission Diagnoses:  Discharge Diagnoses:  Active Problems:   Essential hypertension   CVA (cerebral vascular accident) ([place]) Dysphagia secondary to CVA (acute ischemic left lateral medullary infarct)  Discharged Condition: stable  Hospital Course: Patient is a 78 year old female with past medical history significant for diabetes mellitus type 2, hypertension, hyperlipidemia, goiter and medication noncompliance.  Patient presented with sudden onset of dysphagia, with associated choking episode and right arm paresthesia.  Patient was found to have small left lateral medullary infarct on MRI.  CTA of the brain and neck revealed bilateral carotid artery stenosis, patient will follow up with the vascular surgery team on discharge.  Due to significant dysphagia, cartrack tube was initially placed and patient was started on tube feeds.  Dysphagia has improved significantly.  Repeat speech evaluation was done prior to discharge.  Patient is back on regular diet.  Neurology team assisted in directing patient's care during the hospital stay.  Patient will also follow-up with the vascular surgery team on discharge for bilateral carotid artery stenosis.  Permissive hypertension was initially utilized, but the blood pressure has been slowly controlled with the goal systolic blood pressure advised to be 130 to 150 mmHg due to carotid artery stenosis (neurology team recommendation).  Patient will be discharged on Plavix as patient has allergy to aspirin.  Acute ischemic left lateral medullary stroke: -CT head without contrast negative.  MRI brain revealed acute left lateral medullary infarct. -CTA head and neck revealed bilateral carotid artery and 50% proximal right subclavian artery stenosis. -Echocardiogram revealed greater than 65% ejection fraction with  impaired relaxation.   -Patient was managed with Plavix and statin.  Patient is allergic to aspirin. -Permissive hypertension initially, but blood pressure has been slowly controlled..  Long-term systolic blood pressure goal is 130-150 due to bilateral carotid artery stenosis.. -Patient initially failed swallowing evaluation, and cortrack was placed.  Swallowing evaluation was repeated prior to discharge, and cortrack tube has been discontinued..  -Patient will be discharged on regular feeds.    Essential hypertension:  -Permissive hypertension initially utilized.   -Blood pressure has been slowly controlled.   -Goal systolic blood pressure is 130 to 150 mmHg due to carotid artery stenosis (as per the neurology team).    Uncontrolled IDDM-2: -Hemoglobin A1c was 8.6%.  -Concerns for noncompliance.  -Patient was managed with subcutaneous Lantus and sliding scale insulin coverage during the hospital stay.  -Further optimization of blood sugar control by the primary care provider on discharge.    Hyperlipidemia:  -LDL 158. -Continue high intensity statin  History of goiter:  Not on any medication.  TSH within normal range in 08/2018. -Continue to monitor.    Obesity: BMI 33.63 -Encourage lifestyle change to lose weight. -Further management on outpatient basis.  Hypomagnesemia -Replete  Consults: neurology  Significant Diagnostic Studies: MRI brain, CT brain, MRA brain and neck.  Swallow Evaluation Recommendations: SLP Diet Recommendations: Regular solids;Thin liquid Liquid Administration via: No straw;Cup Medication Administration: Whole meds with liquid Supervision: Patient able to self feed Compensations: Slow rate;Small sips/bites Postural Changes: Seated upright at 90 degrees Oral Care Recommendations: Oral care BID  Discharge Exam: Blood pressure (!) 162/78, pulse 89, temperature 98.2 F (36.8 C), temperature source Oral, resp. rate 17, height 5' 1"  (1.549  m), weight 82.3 kg, SpO2 95 %.  Disposition: Discharge disposition: 01-Home or Self Care   Discharge Instructions  Ambulatory referral to Neurology   Complete by: As directed    Follow up with stroke clinic NP (Jessica Vanschaick or Cecille Rubin, if both not available, consider Zachery Dauer, or [facility]) at [facility] in about 4 weeks. Thanks.   Ambulatory referral to Vascular Surgery   Complete by: As directed    B/l carotid stenosis. Right 75% and left 70%   Diet - low sodium heart healthy   Complete by: As directed    Increase activity slowly   Complete by: As directed      Allergies as of 09/23/2018      Reactions   Ace Inhibitors Swelling, Cough   Pt had cough and diarrhea with first few doses of medication; also had swelling of right eyelid. Stopped medication on 03/17/11.   Alendronate Sodium Other (See Comments)   Whole body hurt   Aspirin Hives   Lipitor [atorvastatin Calcium] Other (See Comments)   Whole body hurt   Metformin And Related    Severe abdominal pain   Shellfish Allergy    Intolerant of fresh shellfish, reports the reaction is GI upset, denies hives, denies any swelling  Reports that she can tolerate canned seafood.       Medication List    TAKE these medications   amLODipine 10 MG tablet Commonly known as: NORVASC Take 1 tablet (10 mg total) by mouth daily.   blood glucose meter kit and supplies Dispense based on patient and insurance preference. Pt just needs meter   clopidogrel 75 MG tablet Commonly known as: PLAVIX Take 1 tablet (75 mg total) by mouth daily. Start taking on: September 24, 2018   glipiZIDE 10 MG 24 hr tablet Commonly known as: GLUCOTROL XL TAKE 1 TABLET(10 MG) BY MOUTH DAILY WITH BREAKFAST What changed: See the new instructions.   glucose blood test strip Test blood sugar 3 times a day. Dx code: 250.00   glucose blood test strip Commonly known as: ONE TOUCH ULTRA TEST Test blood sugar 3 times a day. Dx code: 250.00    hydrochlorothiazide 25 MG tablet Commonly known as: HYDRODIURIL Take 1 tablet (25 mg total) by mouth daily.   Insulin Glargine 100 UNIT/ML Solostar Pen Commonly known as: Lantus SoloStar Inject 15-20 Units into the skin daily. What changed: how much to take   Insulin Pen Needle 32G X 4 MM Misc Commonly known as: BD Pen Needle Nano U/F USE TO INJECT INSULIN ONCE DAILY   INSULIN SYRINGE .5CC/31GX5/16" 31G X 5/16" 0.5 ML Misc Use to inject insulin 1 time daily.   niacin 500 MG CR tablet Commonly known as: NIASPAN Take 1 tablet (500 mg total) by mouth at bedtime. Increase to 2 tablets after 1 month What changed:   when to take this  additional instructions   ONE TOUCH ULTRA MINI w/Device Kit Use to test blood sugar daily as instructed. Dx: E11.65   rosuvastatin 10 MG tablet Commonly known as: CRESTOR Take 1 tablet (10 mg total) by mouth daily at 6 PM.   vitamin E 200 UNIT capsule Take 200 Units by mouth daily. Reported on 05/31/2015      Follow-up Information    [facility]. Schedule an appointment as soon as possible for a visit in 4 week(s).   Specialty: Neurology Contact information: [address] [place] [phone]       [facility] [place]. Schedule an appointment as soon as possible for a visit in 4 week(s).   Specialty:  Vascular Surgery Why: b/l carotid stenosis  Contact information: [place] [phone]          Signed: Bonnell Public 09/23/2018, 2:45 PM

## 2018-09-23 NOTE — Progress Notes (Signed)
Inpatient Diabetes Program Recommendations  AACE/ADA: New Consensus Statement on Inpatient Glycemic Control (2015)  Target Ranges:  Prepandial:   less than 140 mg/dL      Peak postprandial:   less than 180 mg/dL (1-2 hours)      Critically ill patients:  140 - 180 mg/dL   Lab Results  Component Value Date   GLUCAP 264 (H) 09/22/2018   HGBA1C 8.0 (H) 09/20/2018    Review of Glycemic Control Results for Wanda [name], Wanda [name] (MRN [id number]) as of 09/23/2018 11:06  Ref. Range 09/22/2018 03:44 09/22/2018 09:47 09/22/2018 12:45 09/22/2018 17:01 09/22/2018 22:40  Glucose-Capillary Latest Ref Range: 70 - 99 mg/dL 339 (H) 347 (H) 339 (H) 236 (H) 264 (H)   Diabetes history: DM 2 Outpatient Diabetes medications: Lantus 11-20 units, Glipizide 10 mg Daily Current orders for Inpatient glycemic control:  Lantus 10 units bid Novolog 0-15 units tid + hs  A1c 8% on 9/11  Inpatient Diabetes Program Recommendations:    Consider changing Novolog Correction to Q4 hours Add Novolog 6 units Q4 hours Tube Feed Coverage  Thanks, Tama Headings RN, MSN, BC-ADM Inpatient Diabetes Coordinator Team Pager [phone] (8a-5p)

## 2018-09-23 NOTE — TOC Initial Note (Signed)
Transition of Care [facility]) - Initial/Assessment Note    Patient Details  Name: Wanda [name] MRN: [id number] Date of Birth: December 20, 1940  Transition of Care [facility]) CM/SW Contact:    Pollie Friar, RN Phone Number: 09/23/2018, 2:22 PM  Clinical Narrative:                   Expected Discharge Plan: Home/Self Care Barriers to Discharge: No Barriers Identified   Patient Goals and CMS Choice Patient states their goals for this hospitalization and ongoing recovery are:: To get back home      Expected Discharge Plan and Services Expected Discharge Plan: Home/Self Care                                              Prior Living Arrangements/Services   Lives with:: Self Patient language and need for interpreter reviewed:: Yes(no needs) Do you feel safe going back to the place where you live?: Yes      Need for Family Participation in Patient Care: Yes (Comment)(intermittent supervision) Care giver support system in place?: Yes (comment)(daughter and son in law can provide intermittent supervision)   Criminal Activity/Legal Involvement Pertinent to Current Situation/Hospitalization: No - Comment as needed  Activities of Daily Living Home Assistive Devices/Equipment: CBG Meter ADL Screening (condition at time of admission) Patient's cognitive ability adequate to safely complete daily activities?: Yes Is the patient deaf or have difficulty hearing?: No Does the patient have difficulty seeing, even when wearing glasses/contacts?: No Does the patient have difficulty concentrating, remembering, or making decisions?: No Patient able to express need for assistance with ADLs?: Yes Does the patient have difficulty dressing or bathing?: No Independently performs ADLs?: Yes (appropriate for developmental age) Does the patient have difficulty walking or climbing stairs?: No Weakness of Legs: None Weakness of Arms/Hands: None  Permission Sought/Granted                   Emotional Assessment Appearance:: Appears stated age Attitude/Demeanor/Rapport: Engaged Affect (typically observed): Accepting, Pleasant Orientation: : Oriented to Self, Oriented to Place, Oriented to  Time, Oriented to Situation   Psych Involvement: No (comment)  Admission diagnosis:  Brainstem infarct, acute [facility]) [I63.89] Patient Active Problem List   Diagnosis Date Noted  . CVA (cerebral vascular accident) ([place]) 09/19/2018  . Brainstem infarct, acute ([place])   . Primary osteoarthritis of left knee 10/04/2015  . Osteoporosis 08/23/2015  . Multinodular goiter (nontoxic) 04/07/2014  . Diabetes mellitus type 2, uncontrolled, without complications ([place]) [id number]  . Insulin adverse reaction 09/27/2013  . Senile nuclear sclerosis 08/14/2012  . Essential hypertension 03/10/2011  . Hyperlipidemia with target LDL less than 70 03/10/2011  . Obesity (BMI 30.0-34.9) 03/10/2011   PCP:  Darreld Mclean, MD Pharmacy:   [id number] - [place], [place] - [address] AT [place] OF [address] [place] [address] Phone: [phone] Fax: [phone]     Social Determinants of Health (SDOH) Interventions    Readmission Risk Interventions No flowsheet data found.

## 2018-09-23 NOTE — Care Management Important Message (Signed)
Important Message  Patient Details  Name: Wanda [name] MRN: [id number] Date of Birth: 22-Aug-1940   Medicare Important Message Given:  Yes     [name] 09/23/2018, 4:02 PM

## 2018-09-23 NOTE — Progress Notes (Signed)
D/C instructions provided to patient, denies questions/concerns at this time. Patient transported to front entrance via WC, tol well. 

## 2018-09-23 NOTE — Progress Notes (Signed)
Modified Barium Swallow Progress Note  Patient Details  Name: Wanda [name] MRN: [id number] Date of Birth: 17-May-1940  Today's Date: 09/23/2018  Modified Barium Swallow completed.  Full report located under Chart Review in the Imaging Section.  Brief recommendations include the following:  Clinical Impression  Pt presents with functional oropharyngeal swallowing.  There was no penetration or aspiration of any consistencies trialed, including serial straw sips of thin liquid.  Pt exhibited adequate oral clearance of solid.  With pill simulation there was esophageal stasis of tablet on esophageal sweep, which may have been due in part to presence of NG tube.  Recommend discontinuing and removal of NGT and advancing to regular texture diet with thin liquids. Recommend SLP follow up x1 to ensure diet tolerance.   Swallow Evaluation Recommendations       SLP Diet Recommendations: Regular solids;Thin liquid   Liquid Administration via: No straw;Cup   Medication Administration: Whole meds with liquid   Supervision: Patient able to self feed   Compensations: Slow rate;Small sips/bites   Postural Changes: Seated upright at 90 degrees   Oral Care Recommendations: Oral care BID        Celedonio Savage, [place], [place] Office: [phone] 09/23/2018,12:26 PM

## 2018-09-23 NOTE — TOC Transition Note (Addendum)
Transition of Care [facility]) - CM/SW Discharge Note   Patient Details  Name: Wanda [name] MRN: [id number] Date of Birth: [birth year]  Transition of Care [facility]) CM/SW Contact:  Pollie Friar, RN Phone Number: 09/23/2018, 2:22 PM   Clinical Narrative:    Pt denies any issues obtaining or taking home meds. Pt denies transportation issues. Pt discharging home with self care. No f/u per PT/OT and no DME needs.  Pt has transportation home.   Final next level of care: Home/Self Care Barriers to Discharge: No Barriers Identified   Patient Goals and CMS Choice Patient states their goals for this hospitalization and ongoing recovery are:: To get back home      Discharge Placement                       Discharge Plan and Services                                     Social Determinants of Health (SDOH) Interventions     Readmission Risk Interventions No flowsheet data found.

## 2018-09-23 NOTE — Plan of Care (Signed)
Patient stable, discussed POC with patient, agreeable with plan, denies question/concerns at this time.  

## 2018-09-23 NOTE — Progress Notes (Signed)
  Speech Language Pathology Treatment: Dysphagia  Patient Details Name: Wanda [name] MRN: [id number] DOB: November 16, 1940 Today's Date: 09/23/2018 Time: [id number] SLP Time Calculation (min) (ACUTE ONLY): 5 min  Assessment / Plan / Recommendation Clinical Impression  Pt reports good consumption of full liquid dinner last night. Pt was able to eat pudding and swallow 3 pills as well.  Today with thin liquid by straw, pt exhibited intermittent throat clearing.  Pt used pacing strategy independently. Per RN and pt report discharge planning is imminent pending swallow function. Given throat clear and location of lesion, recommend MBS to further assess pharyngeal function prior to discharge and/or advancing diet.  SLP will attempt to schedule MBS as soon as radiology schedule will permit.    HPI HPI: Wanda [name] is a 78 y.o. female past medical history of diabetes, hypertension, in her usual state of health when she went to bed on Wednesday, 09/17/2018 and woke up the next morning not feeling at her 100%.  She was not really sure what was wrong but by the end of the day, when she sat for dinner around 6 PM, she did not feel well and could not swallow her food.  This is very unusual for her and is never happened to her before.  She did not make much of it and slept.  MRI brain was obtained that showed a left lateral medullary stroke.      SLP Plan  Continue with current plan of care       Recommendations  Diet recommendations: (Full liquids) Liquids provided via: Straw;Cup Supervision: Patient able to self feed Compensations: Slow rate;Small sips/bites Postural Changes and/or Swallow Maneuvers: Seated upright 90 degrees                Oral Care Recommendations: Oral care BID Follow up Recommendations: Outpatient SLP SLP Visit Diagnosis: Dysphagia, oropharyngeal phase (R13.12) Plan: Continue with current plan of care       [place], [place], [place] Office: [phone]; Pager (9/14): [phone] 09/23/2018, 10:49 AM

## 2018-09-28 NOTE — Progress Notes (Signed)
Wanda [name] at [facility] [address] [place],  [address] [phone]  Date:  09/30/2018   Name:  Wanda [name]   DOB:  [birth year]   MRN:  [id number]  PCP:  Wanda Mclean, MD    Chief Complaint: Hospitalization Follow-up (CVA)   History of Present Illness:  Wanda [name] is a 78 y.o. very pleasant female patient who presents with the following:  Wanda [name] is here today for an inpatient follow-up visit.  She was recently admitted with a minor stroke I have actually seen her the day prior to her stroke, at which time her blood pressure was 142/80 She notes that at 7pm that evening she could not swallow.  She went to bed, but the next day the swallowing problem was still there- she called 911 and went to the hospital.  She was admitted for 4 days, it seems that the issue is carotid stenosis She has some numbness in her right arm which persists-otherwise she is basically back to normal She is able to swallow mostly normal right now, only notes some problems with certain types of meat  She is seeing vascular surgery next month to discuss her carotids She is taking her plavix as directed, as well as Crestor Admit date: 09/19/2018 Discharge date: 09/23/2018 Discharge Diagnoses:  Active Problems:   Essential hypertension   CVA (cerebral vascular accident) ([place]) Dysphagia secondary to CVA (acute ischemic left lateral medullary infarct)  Hospital Course: Patient is a 78 year old female with past medical history significant for diabetes mellitus type 2, hypertension, hyperlipidemia, goiter and medication noncompliance.  Patient presented withsudden onset ofdysphagia, with associatedchoking episode and right arm paresthesia. Patient wasfound to have small left lateral medullary infarct on MRI. CTA of the brain and neck revealed bilateral carotid artery stenosis, patient will follow up with the vascular surgery team on discharge. Due  to significant dysphagia,cartracktube was initially placed and patient was started on tube feeds.  Dysphagia has improved significantly.  Repeat speech evaluation was done prior to discharge.  Patient is back on regular diet.  Neurology team assisted in directing patient's care during the hospital stay.  Patient will also follow-up with the vascular surgery team on discharge for bilateral carotid artery stenosis.  Permissive hypertension was initially utilized, but the blood pressure has been slowly controlled with the goal systolic blood pressure advised to be 130 to 150 mmHg due to carotid artery stenosis (neurology team recommendation).  Patient will be discharged on Plavix as patient has allergy to aspirin.  Acute ischemic left lateral medullary stroke: -CT head without contrast negative. MRI brain revealed acute left lateral medullary infarct. -CTA head and neck revealed bilateral carotid artery and 50% proximal right subclavian artery stenosis. -Echocardiogram revealed greater than 65% ejection fraction with impaired relaxation.   -Patient was managed with Plavix and statin.  Patient is allergic to aspirin. -Permissive hypertension initially, but blood pressure has been slowly controlled.. Long-term systolic blood pressure goal is 130-150 due to bilateral carotid artery stenosis.. -Patient initially failed swallowing evaluation, and cortrack was placed.  Swallowing evaluation was repeated prior to discharge, and cortrack tube has been discontinued..  -Patient will be discharged on regular feeds.    Essential hypertension: -Permissive hypertension initially utilized.   -Blood pressure has been slowly controlled.   -Goal systolic blood pressure is 130 to 150 mmHg due to carotid artery stenosis (as per the neurology team).   Uncontrolled IDDM-2: -Hemoglobin A1c was 8.6%.  -Concerns  for noncompliance.  -Patient was managed with subcutaneous Lantus and sliding scale insulin coverage during  the hospital stay.  -Further optimization of blood sugar control by the primary care provider on discharge.   Hyperlipidemia:  -LDL 158. -Continue high intensity statin History of goiter:  Not on any medication. TSH within normal range in 08/2018. -Continue to monitor.   Obesity: BMI 33.63 -Encourage lifestyle change to lose weight. -Further management on outpatient basis. Hypomagnesemia -Replete  Consults: neurology  Significant Diagnostic Studies: MRI brain, CT brain, MRA brain and neck.  Amlodipine 10 Plavix 75 Glipizide XL 10 daily HCTZ Lantus Niaspan Crestor   Lab Results  Component Value Date   HGBA1C 8.0 (H) 09/20/2018   Would also like to potentially start an SGLT2 inhibitor for blood sugar control Renal function is normal  She notes that her SBP is running 130-150s-this is at goal  Her glucose is running sometimes over 200 at night, over 150 in the am  No hypoglycemia noted  No difficulty speaking, no headaches She would like to use her stationary bike and lift some hand weights-advised that this is ok  She is determined to lose some weight Patient Active Problem List   Diagnosis Date Noted  . CVA (cerebral vascular accident) ([place]) 09/19/2018  . Brainstem infarct, acute ([place])   . Primary osteoarthritis of left knee 10/04/2015  . Osteoporosis 08/23/2015  . Multinodular goiter (nontoxic) 04/07/2014  . Diabetes mellitus type 2, uncontrolled, without complications ([place]) 16/10/9602  . Insulin adverse reaction 09/27/2013  . Senile nuclear sclerosis 08/14/2012  . Essential hypertension 03/10/2011  . Hyperlipidemia with target LDL less than 70 03/10/2011  . Obesity (BMI 30.0-34.9) 03/10/2011    Past Medical History:  Diagnosis Date  . Arthritis   . Cataract   . Diabetes mellitus   . Hypertension   . Skin cancer    Removed from face  . Urgency of urination   . Urinary leakage     Past Surgical History:  Procedure Laterality Date  . ABDOMINAL  HYSTERECTOMY  early 80's   total  . CARPAL TUNNEL RELEASE Bilateral 1980 and 1981   both hands  . EYE SURGERY     bilateral cataracts  . Fatty Tumor Excision    . JOINT REPLACEMENT    . TONSILLECTOMY    . TONSILLECTOMY AND ADENOIDECTOMY  age 62  . TOTAL KNEE ARTHROPLASTY Right 10/04/2015  . TOTAL KNEE ARTHROPLASTY Right 10/04/2015   Procedure: TOTAL KNEE ARTHROPLASTY;  Surgeon: Dorna Leitz, MD;  Location: [facility];  Service: Orthopedics;  Laterality: Right;  . TOTAL KNEE ARTHROPLASTY Left 01/28/2016   Procedure: TOTAL KNEE ARTHROPLASTY;  Surgeon: Dorna Leitz, MD;  Location: [facility];  Service: Orthopedics;  Laterality: Left;  . TUBAL LIGATION      Social History   Tobacco Use  . Smoking status: Never Smoker  . Smokeless tobacco: Never Used  Substance Use Topics  . Alcohol use: Yes    Alcohol/week: 1.0 standard drinks    Types: 1 Standard drinks or equivalent per week    Comment: maybe once per month  . Drug use: No    Family History  Problem Relation Age of Onset  . Pancreatitis Father        deceased 81  . Cancer Other   . Cancer Brother        GI  . Cancer Mother        liver  . Cancer Son        terminal kidney  Allergies  Allergen Reactions  . Ace Inhibitors Swelling and Cough    Pt had cough and diarrhea with first few doses of medication; also had swelling of right eyelid. Stopped medication on 03/17/11.  . Alendronate Sodium Other (See Comments)    Whole body hurt  . Aspirin Hives  . Lipitor [Atorvastatin Calcium] Other (See Comments)    Whole body hurt  . Metformin And Related     Severe abdominal pain  . Shellfish Allergy     Intolerant of fresh shellfish, reports the reaction is GI upset, denies hives, denies any swelling  Reports that she can tolerate canned seafood.     Medication list has been reviewed and updated.  Current Outpatient Medications on File Prior to Visit  Medication Sig Dispense Refill  . amLODipine (NORVASC) 10 MG tablet Take 1  tablet (10 mg total) by mouth daily. 90 tablet 3  . blood glucose meter kit and supplies Dispense based on patient and insurance preference. Pt just needs meter 1 each 0  . Blood Glucose Monitoring Suppl (ONE TOUCH ULTRA MINI) w/Device KIT Use to test blood sugar daily as instructed. Dx: E11.65 1 each 0  . clopidogrel (PLAVIX) 75 MG tablet Take 1 tablet (75 mg total) by mouth daily. 30 tablet 0  . glipiZIDE (GLUCOTROL XL) 10 MG 24 hr tablet TAKE 1 TABLET(10 MG) BY MOUTH DAILY WITH BREAKFAST (Patient taking differently: Take 10 mg by mouth daily with breakfast. ) 90 tablet 3  . glucose blood (ONE TOUCH ULTRA TEST) test strip Test blood sugar 3 times a day. Dx code: 250.00 100 each 12  . hydrochlorothiazide (HYDRODIURIL) 25 MG tablet Take 1 tablet (25 mg total) by mouth daily. 90 tablet 3  . Insulin Glargine (LANTUS SOLOSTAR) 100 UNIT/ML Solostar Pen Inject 15-20 Units into the skin daily. (Patient taking differently: Inject 11-20 Units into the skin daily. ) 5 pen 11  . Insulin Pen Needle (BD PEN NEEDLE NANO U/F) 32G X 4 MM MISC USE TO INJECT INSULIN ONCE DAILY 100 each 6  . Insulin Syringe-Needle U-100 (INSULIN SYRINGE .5CC/31GX5/16") 31G X 5/16" 0.5 ML MISC Use to inject insulin 1 time daily. 90 each 3  . niacin (NIASPAN) 500 MG CR tablet Take 1 tablet (500 mg total) by mouth at bedtime. Increase to 2 tablets after 1 month (Patient taking differently: Take 500 mg by mouth daily. ) 180 tablet 2  . rosuvastatin (CRESTOR) 10 MG tablet Take 1 tablet (10 mg total) by mouth daily at 6 PM. 30 tablet 0  . vitamin E 200 UNIT capsule Take 200 Units by mouth daily. Reported on 05/31/2015    . [DISCONTINUED] lisinopril (PRINIVIL,ZESTRIL) 20 MG tablet Take 20 mg by mouth daily.      No current facility-administered medications on file prior to visit.     Review of Systems:  As per HPI- otherwise negative. No fever or chills, no chest pain or shortness of breath.  No headache  Physical Examination: Vitals:    09/30/18 1428  BP: 136/62  Pulse: 97  Resp: 16  Temp: (!) 97 F (36.1 C)  SpO2: 98%   Vitals:   09/30/18 1428  Weight: 172 lb (78 kg)  Height: 5' 1" (1.549 m)   Body mass index is 32.5 kg/m. Ideal Body Weight: Weight in (lb) to have BMI = 25: 132  GEN: WDWN, NAD, Non-toxic, A & O x 3, obese, looks well HEENT: Atraumatic, Normocephalic. Neck supple. No masses, No LAD. Ears and Nose:  No external deformity. CV: RRR, No M/G/R. No JVD. No thrill. No extra heart sounds. PULM: CTA B, no wheezes, crackles, rhonchi. No retractions. No resp. distress. No accessory muscle use. EXTR: No c/c/e NEURO Normal gait.  Normal strength and deep tendon reflex both upper extremities.  No speech impediments or swallowing difficulty noted PSYCH: Normally interactive. Conversant. Not depressed or anxious appearing.  Calm demeanor.    Assessment and Plan: Hospital discharge follow-up  Cerebrovascular accident (CVA) due to thrombosis of basilar artery ([place])  Essential hypertension  Hyperlipidemia with target LDL less than 70  Diabetes mellitus type 2, uncontrolled, without complications ([place]) - Plan: dapagliflozin propanediol (FARXIGA) 5 MG TABS tablet  Here today for a hospital follow-up, she was recently admitted with a CVA.  Thankfully she has recovered more or less completely. Stroke thought due to carotid stenosis.  She is taking Plavix.  Will see vascular surgery next month Blood pressure currently at goal Continue statin Hesitate to increase dose of glipizide due to her age and risk of hypoglycemia.  We will add 5 mg of Farxiga instead.  Have asked her let me know if this medication is too expensive Otherwise plan to visit in 3 months  Signed Lamar Blinks, MD

## 2018-09-30 ENCOUNTER — Other Ambulatory Visit: Payer: Self-pay

## 2018-09-30 ENCOUNTER — Ambulatory Visit (INDEPENDENT_AMBULATORY_CARE_PROVIDER_SITE_OTHER): Payer: Medicare Other | Admitting: Family Medicine

## 2018-09-30 ENCOUNTER — Encounter: Payer: Self-pay | Admitting: Family Medicine

## 2018-09-30 VITALS — BP 136/62 | HR 97 | Temp 97.0°F | Resp 16 | Ht 61.0 in | Wt 172.0 lb

## 2018-09-30 DIAGNOSIS — I10 Essential (primary) hypertension: Secondary | ICD-10-CM | POA: Diagnosis not present

## 2018-09-30 DIAGNOSIS — I69351 Hemiplegia and hemiparesis following cerebral infarction affecting right dominant side: Secondary | ICD-10-CM

## 2018-09-30 DIAGNOSIS — E1165 Type 2 diabetes mellitus with hyperglycemia: Secondary | ICD-10-CM | POA: Diagnosis not present

## 2018-09-30 DIAGNOSIS — Z09 Encounter for follow-up examination after completed treatment for conditions other than malignant neoplasm: Secondary | ICD-10-CM

## 2018-09-30 DIAGNOSIS — IMO0001 Reserved for inherently not codable concepts without codable children: Secondary | ICD-10-CM

## 2018-09-30 DIAGNOSIS — I6302 Cerebral infarction due to thrombosis of basilar artery: Secondary | ICD-10-CM

## 2018-09-30 DIAGNOSIS — E785 Hyperlipidemia, unspecified: Secondary | ICD-10-CM

## 2018-09-30 MED ORDER — DAPAGLIFLOZIN PROPANEDIOL 5 MG PO TABS: 5.0000 mg | ORAL_TABLET | Freq: Every day | ORAL | 6 refills | Status: DC

## 2018-09-30 NOTE — Patient Instructions (Signed)
It was good to see you today-  I am so sorry that you had this stroke but glad that you do not have any serious complications!  Continue your plavix and cholesterol med Our goal for your BP "top number" is 130- 150 We are going to add [place] once a day for your blood sugar If this is too expensive please alert me Please see me in 3 months for a follow-up visit and A1c recheck.  A1c goal is 7- 7.5%    Lab Results  Component Value Date   HGBA1C 8.0 (H) 09/20/2018

## 2018-10-02 ENCOUNTER — Encounter: Payer: Self-pay | Admitting: Family Medicine

## 2018-10-03 MED ORDER — GLIPIZIDE ER 5 MG PO TB24: ORAL_TABLET | ORAL | 6 refills | Status: DC

## 2018-10-03 NOTE — Addendum Note (Signed)
Addended by: Lamar Blinks C on: 10/03/2018 05:43 PM   Modules accepted: Orders

## 2018-10-07 ENCOUNTER — Encounter: Payer: Self-pay | Admitting: Family Medicine

## 2018-10-14 ENCOUNTER — Encounter: Payer: Self-pay | Admitting: Family Medicine

## 2018-10-14 MED ORDER — CLOPIDOGREL BISULFATE 75 MG PO TABS: 75.0000 mg | ORAL_TABLET | Freq: Every day | ORAL | 1 refills | Status: DC

## 2018-10-23 ENCOUNTER — Telehealth: Payer: Self-pay

## 2018-10-23 ENCOUNTER — Inpatient Hospital Stay: Payer: Medicare Other | Admitting: Adult Health

## 2018-10-23 NOTE — Telephone Encounter (Signed)
Opened in error

## 2018-10-23 NOTE — Telephone Encounter (Signed)
Unable to get in contact with the patient to r/s her appt with [name]. LVM asking her to call the office to r/s. Appt was cancelled and office number was provided.   If the patient calls back please r/s her appt with [name] for a Hospital f/u.

## 2018-10-28 DIAGNOSIS — D3132 Benign neoplasm of left choroid: Secondary | ICD-10-CM | POA: Diagnosis not present

## 2018-10-28 DIAGNOSIS — Z961 Presence of intraocular lens: Secondary | ICD-10-CM | POA: Diagnosis not present

## 2018-10-28 DIAGNOSIS — H40013 Open angle with borderline findings, low risk, bilateral: Secondary | ICD-10-CM | POA: Diagnosis not present

## 2018-10-28 DIAGNOSIS — E113293 Type 2 diabetes mellitus with mild nonproliferative diabetic retinopathy without macular edema, bilateral: Secondary | ICD-10-CM | POA: Diagnosis not present

## 2018-10-29 ENCOUNTER — Ambulatory Visit (INDEPENDENT_AMBULATORY_CARE_PROVIDER_SITE_OTHER): Payer: Medicare Other | Admitting: Vascular Surgery

## 2018-10-29 ENCOUNTER — Encounter: Payer: Self-pay | Admitting: Vascular Surgery

## 2018-10-29 ENCOUNTER — Other Ambulatory Visit: Payer: Self-pay

## 2018-10-29 VITALS — BP 160/83 | HR 77 | Temp 98.0°F | Resp 20 | Ht 61.0 in | Wt 176.6 lb

## 2018-10-29 DIAGNOSIS — I6523 Occlusion and stenosis of bilateral carotid arteries: Secondary | ICD-10-CM

## 2018-10-29 DIAGNOSIS — I639 Cerebral infarction, unspecified: Secondary | ICD-10-CM | POA: Diagnosis not present

## 2018-10-29 NOTE — H&P (View-Only) (Signed)
  [facility] [place]  Patient name: Wanda [name] MRN: [id number] DOB: 04/09/1940 Sex: female  REASON FOR VISIT: Evaluation recent left brain stroke and carotid stenosis  HPI: Wanda [name] is a 78 y.o. female here for evaluation of recent hospitalization for left brain stroke and bilateral carotid stenosis.  I had seen her for evaluation of lower extremity discomfort which did not appear to be related to peripheral vascular occlusive disease in June 2020.  She reports that she had an episode of dysarthria and paresthesia in her right arm and was admitted to the hospital on 09/20/2018.  She was seen by neurology after thorough work-up and was felt to have asymptomatic left carotid stenosis.  She is seen today for further discussion.  She has been maintained on Plavix since this event.  She reports that she continues to have some paresthesia of her right arm but is not having any motor weakness and no other speech difficulty.  Past Medical History:  Diagnosis Date  . Arthritis   . Cataract   . Diabetes mellitus   . Hypertension   . Skin cancer    Removed from face  . Urgency of urination   . Urinary leakage     Family History  Problem Relation Age of Onset  . Pancreatitis Father        deceased 32  . Cancer Other   . Cancer Brother        GI  . Cancer Mother        liver  . Cancer Son        terminal kidney    SOCIAL HISTORY: Social History   Tobacco Use  . Smoking status: Never Smoker  . Smokeless tobacco: Never Used  Substance Use Topics  . Alcohol use: Yes    Alcohol/week: 1.0 standard drinks    Types: 1 Standard drinks or equivalent per week    Comment: maybe once per month    Allergies  Allergen Reactions  . Ace Inhibitors Swelling and Cough    Pt had cough and diarrhea with first few doses of medication; also had swelling of right eyelid. Stopped medication on 03/17/11.  . Alendronate Sodium Other (See  Comments)    Whole body hurt  . Aspirin Hives  . Lipitor [Atorvastatin Calcium] Other (See Comments)    Whole body hurt  . Metformin And Related     Severe abdominal pain  . Shellfish Allergy     Intolerant of fresh shellfish, reports the reaction is GI upset, denies hives, denies any swelling  Reports that she can tolerate canned seafood.     Current Outpatient Medications  Medication Sig Dispense Refill  . blood glucose meter kit and supplies Dispense based on patient and insurance preference. Pt just needs meter 1 each 0  . Blood Glucose Monitoring Suppl (ONE TOUCH ULTRA MINI) w/Device KIT Use to test blood sugar daily as instructed. Dx: E11.65 1 each 0  . clopidogrel (PLAVIX) 75 MG tablet Take 1 tablet (75 mg total) by mouth daily. 90 tablet 1  . glipiZIDE (GLUCOTROL XL) 5 MG 24 hr tablet Take 10 mg am and 5 mg pm for diabetes 90 tablet 6  . glucose blood (ONE TOUCH ULTRA TEST) test strip Test blood sugar 3 times a day. Dx code: 250.00 100 each 12  . Insulin Glargine (LANTUS SOLOSTAR) 100 UNIT/ML Solostar Pen Inject 15-20 Units into the skin daily. (Patient taking differently: Inject 11-20 Units into the skin daily. )   5 pen 11  . Insulin Pen Needle (BD PEN NEEDLE NANO U/F) 32G X 4 MM MISC USE TO INJECT INSULIN ONCE DAILY 100 each 6  . Insulin Syringe-Needle U-100 (INSULIN SYRINGE .5CC/31GX5/16") 31G X 5/16" 0.5 ML MISC Use to inject insulin 1 time daily. 90 each 3  . niacin (NIASPAN) 500 MG CR tablet Take 1 tablet (500 mg total) by mouth at bedtime. Increase to 2 tablets after 1 month (Patient taking differently: Take 500 mg by mouth daily. ) 180 tablet 2  . rosuvastatin (CRESTOR) 10 MG tablet Take 1 tablet (10 mg total) by mouth daily at 6 PM. 30 tablet 0  . vitamin E 200 UNIT capsule Take 200 Units by mouth daily. Reported on 05/31/2015    . amLODipine (NORVASC) 10 MG tablet Take 1 tablet (10 mg total) by mouth daily. (Patient not taking: Reported on 10/29/2018) 90 tablet 3  .  hydrochlorothiazide (HYDRODIURIL) 25 MG tablet Take 1 tablet (25 mg total) by mouth daily. (Patient not taking: Reported on 10/29/2018) 90 tablet 3   No current facility-administered medications for this visit.     REVIEW OF SYSTEMS:  [X] denotes positive finding, [ ] denotes negative finding Cardiac  Comments:  Chest pain or chest pressure:    Shortness of breath upon exertion:    Short of breath when lying flat:    Irregular heart rhythm:        Vascular    Pain in calf, thigh, or hip brought on by ambulation:    Pain in feet at night that wakes you up from your sleep:     Blood clot in your veins:    Leg swelling:           PHYSICAL EXAM: Vitals:   10/29/18 0935 10/29/18 0940  BP: (!) 185/82 (!) 160/83  Pulse: 77   Resp: 20   Temp: 98 F (36.7 C)   SpO2: 98%   Weight: 80.1 kg   Height: 5' 1" (1.549 m)     GENERAL: The patient is a well-nourished female, in no acute distress. The vital signs are documented above. CARDIOVASCULAR: Carotid arteries are without bruits bilaterally.  She has 2+ radial pulses bilaterally. PULMONARY: There is good air exchange  MUSCULOSKELETAL: There are no major deformities or cyanosis. NEUROLOGIC: No focal weakness.  Does have paresthesias over her right lateral forearm SKIN: There are no ulcers or rashes noted. PSYCHIATRIC: The patient has a normal affect.  DATA:  CT angiogram at the time of her admission on 09/20/2018 were reviewed.  This shows mixed plaque with calcification and irregularity at the carotid bifurcation bilaterally.  Her level of stenosis approximately 70 to 75% bilaterally  MEDICAL ISSUES: Had a long discussion with the patient.  Her MRI did show a small left brain stroke.  Explained the most likely cause of this is her carotid disease.  I have recommended left carotid endarterectomy for symptomatic disease.  Also explained that we would discuss treatment of her right carotid following recovery.  She does have level of  disease that would recommend asymptomatic endarterectomy surgery on the right after she recovers.  I explained the procedure in detail including 1 to 2% risk of stroke with surgery.  Also the very slight risk of cranial nerve injury and the slight risk of bleeding and infection.  She understands and wished to proceed as scheduled.  She will continue her Plavix through the procedure due to her symptomatic carotid disease.  I explained the expected 1   night hospitalization and the typical recovery after surgery.    Emilyann Banka F. Marshayla Mitschke, MD FACS [facility] Office Tel [phone] Pager [phone]  

## 2018-10-29 NOTE — Progress Notes (Signed)
Vascular and Vein Specialist of [facility])  Patient name: Wanda [name] MRN: [id number] DOB: Mar 04, 1940 Sex: female  REASON FOR VISIT: Evaluation recent left brain stroke and carotid stenosis  HPI: Wanda [name] is a 78 y.o. female here for evaluation of recent hospitalization for left brain stroke and bilateral carotid stenosis.  I had seen her for evaluation of lower extremity discomfort which did not appear to be related to peripheral vascular occlusive disease in June 2020.  She reports that she had an episode of dysarthria and paresthesia in her right arm and was admitted to the hospital on 09/20/2018.  She was seen by neurology after thorough work-up and was felt to have asymptomatic left carotid stenosis.  She is seen today for further discussion.  She has been maintained on Plavix since this event.  She reports that she continues to have some paresthesia of her right arm but is not having any motor weakness and no other speech difficulty.  Past Medical History:  Diagnosis Date  . Arthritis   . Cataract   . Diabetes mellitus   . Hypertension   . Skin cancer    Removed from face  . Urgency of urination   . Urinary leakage     Family History  Problem Relation Age of Onset  . Pancreatitis Father        deceased 21  . Cancer Other   . Cancer Brother        GI  . Cancer Mother        liver  . Cancer Son        terminal kidney    SOCIAL HISTORY: Social History   Tobacco Use  . Smoking status: Never Smoker  . Smokeless tobacco: Never Used  Substance Use Topics  . Alcohol use: Yes    Alcohol/week: 1.0 standard drinks    Types: 1 Standard drinks or equivalent per week    Comment: maybe once per month    Allergies  Allergen Reactions  . Ace Inhibitors Swelling and Cough    Pt had cough and diarrhea with first few doses of medication; also had swelling of right eyelid. Stopped medication on 03/17/11.  . Alendronate Sodium Other (See  Comments)    Whole body hurt  . Aspirin Hives  . Lipitor [Atorvastatin Calcium] Other (See Comments)    Whole body hurt  . Metformin And Related     Severe abdominal pain  . Shellfish Allergy     Intolerant of fresh shellfish, reports the reaction is GI upset, denies hives, denies any swelling  Reports that she can tolerate canned seafood.     Current Outpatient Medications  Medication Sig Dispense Refill  . blood glucose meter kit and supplies Dispense based on patient and insurance preference. Pt just needs meter 1 each 0  . Blood Glucose Monitoring Suppl (ONE TOUCH ULTRA MINI) w/Device KIT Use to test blood sugar daily as instructed. Dx: E11.65 1 each 0  . clopidogrel (PLAVIX) 75 MG tablet Take 1 tablet (75 mg total) by mouth daily. 90 tablet 1  . glipiZIDE (GLUCOTROL XL) 5 MG 24 hr tablet Take 10 mg am and 5 mg pm for diabetes 90 tablet 6  . glucose blood (ONE TOUCH ULTRA TEST) test strip Test blood sugar 3 times a day. Dx code: 250.00 100 each 12  . Insulin Glargine (LANTUS SOLOSTAR) 100 UNIT/ML Solostar Pen Inject 15-20 Units into the skin daily. (Patient taking differently: Inject 11-20 Units into the skin daily. )  5 pen 11  . Insulin Pen Needle (BD PEN NEEDLE NANO U/F) 32G X 4 MM MISC USE TO INJECT INSULIN ONCE DAILY 100 each 6  . Insulin Syringe-Needle U-100 (INSULIN SYRINGE .5CC/31GX5/16") 31G X 5/16" 0.5 ML MISC Use to inject insulin 1 time daily. 90 each 3  . niacin (NIASPAN) 500 MG CR tablet Take 1 tablet (500 mg total) by mouth at bedtime. Increase to 2 tablets after 1 month (Patient taking differently: Take 500 mg by mouth daily. ) 180 tablet 2  . rosuvastatin (CRESTOR) 10 MG tablet Take 1 tablet (10 mg total) by mouth daily at 6 PM. 30 tablet 0  . vitamin E 200 UNIT capsule Take 200 Units by mouth daily. Reported on 05/31/2015    . amLODipine (NORVASC) 10 MG tablet Take 1 tablet (10 mg total) by mouth daily. (Patient not taking: Reported on 10/29/2018) 90 tablet 3  .  hydrochlorothiazide (HYDRODIURIL) 25 MG tablet Take 1 tablet (25 mg total) by mouth daily. (Patient not taking: Reported on 10/29/2018) 90 tablet 3   No current facility-administered medications for this visit.     REVIEW OF SYSTEMS:  _0  denotes positive finding, _1  denotes negative finding Cardiac  Comments:  Chest pain or chest pressure:    Shortness of breath upon exertion:    Short of breath when lying flat:    Irregular heart rhythm:        Vascular    Pain in calf, thigh, or hip brought on by ambulation:    Pain in feet at night that wakes you up from your sleep:     Blood clot in your veins:    Leg swelling:           PHYSICAL EXAM: Vitals:   10/29/18 0935 10/29/18 0940  BP: (!) 185/82 (!) 160/83  Pulse: 77   Resp: 20   Temp: 98 F (36.7 C)   SpO2: 98%   Weight: 80.1 kg   Height: _2  (1.549 m)     GENERAL: The patient is a well-nourished female, in no acute distress. The vital signs are documented above. CARDIOVASCULAR: Carotid arteries are without bruits bilaterally.  She has 2+ radial pulses bilaterally. PULMONARY: There is good air exchange  MUSCULOSKELETAL: There are no major deformities or cyanosis. NEUROLOGIC: No focal weakness.  Does have paresthesias over her right lateral forearm SKIN: There are no ulcers or rashes noted. PSYCHIATRIC: The patient has a normal affect.  DATA:  CT angiogram at the time of her admission on 09/20/2018 were reviewed.  This shows mixed plaque with calcification and irregularity at the carotid bifurcation bilaterally.  Her level of stenosis approximately 70 to 75% bilaterally  MEDICAL ISSUES: Had a long discussion with the patient.  Her MRI did show a small left brain stroke.  Explained the most likely cause of this is her carotid disease.  I have recommended left carotid endarterectomy for symptomatic disease.  Also explained that we would discuss treatment of her right carotid following recovery.  She does have level of  disease that would recommend asymptomatic endarterectomy surgery on the right after she recovers.  I explained the procedure in detail including 1 to 2% risk of stroke with surgery.  Also the very slight risk of cranial nerve injury and the slight risk of bleeding and infection.  She understands and wished to proceed as scheduled.  She will continue her Plavix through the procedure due to her symptomatic carotid disease.  I explained the expected 1  night hospitalization and the typical recovery after surgery.    Rosetta Posner, MD FACS [facility] and Vein Specialists of [facility] Tel [phone] Pager [phone]

## 2018-11-05 NOTE — Pre-Procedure Instructions (Signed)
Wanda [name]  11/05/2018      [id number] - [place], [place] - [address] AT [place] [address] [place]  [address] Phone: [phone] Fax: [phone]    Your procedure is scheduled on 11/13/18.  Report to [facility] Admitting at 630 A.M.  Call this number if you have problems the morning of surgery:  [phone]   Remember:  Do not eat or drink after midnight.   Take these medicines the morning of surgery with A SIP OF WATER ----NONE    Do not wear jewelry, make-up or nail polish.  Do not wear lotions, powders, or perfumes, or deodorant.  Do not shave 48 hours prior to surgery.  Men may shave face and neck.  Do not bring valuables to the hospital.  [facility] is not responsible for any belongings or valuables.  Contacts, dentures or bridgework may not be worn into surgery.  Leave your suitcase in the car.  After surgery it may be brought to your room.  For patients admitted to the hospital, discharge time will be determined by your treatment team.  Patients discharged the day of surgery will not be allowed to drive home.   Do not take any aspirin,anti-inflammatories,vitamins,or herbal supplements 5-7 days prior to surgery. [place] - Preparing for Surgery  Before surgery, you can play an important role.  Because skin is not sterile, your skin needs to be as free of germs as possible.  You can reduce the number of germs on you skin by washing with CHG (chlorahexidine gluconate) soap before surgery.  CHG is an antiseptic cleaner which kills germs and bonds with the skin to continue killing germs even after washing.  Oral Hygiene is also important in reducing the risk of infection.  Remember to brush your teeth with your regular toothpaste the morning of surgery.  Please DO NOT use if you have an allergy to CHG or antibacterial soaps.  If your skin becomes reddened/irritated stop using the CHG and  inform your nurse when you arrive at Short Stay.  Do not shave (including legs and underarms) for at least 48 hours prior to the first CHG shower.  You may shave your face.  Please follow these instructions carefully:   1.  Shower with CHG Soap the night before surgery and the morning of Surgery.  2.  If you choose to wash your hair, wash your hair first as usual with your normal shampoo.  3.  After you shampoo, rinse your hair and body thoroughly to remove the shampoo. 4.  Use CHG as you would any other liquid soap.  You can apply chg directly to the skin and wash gently with a      scrungie or washcloth.           5.  Apply the CHG Soap to your body ONLY FROM THE NECK DOWN.   Do not use on open wounds or open sores. Avoid contact with your eyes, ears, mouth and genitals (private parts).  Wash genitals (private parts) with your normal soap.  6.  Wash thoroughly, paying special attention to the area where your surgery will be performed.  7.  Thoroughly rinse your body with warm water from the neck down.  8.  DO NOT shower/wash with your normal soap after using and rinsing off the CHG Soap.  9.  Pat yourself dry with a clean towel.  10.  Wear clean pajamas.            11.  Place clean sheets on your bed the night of your first shower and do not sleep with pets.  Day of Surgery  Do not apply any lotions/deoderants the morning of surgery.   Please wear clean clothes to the hospital/surgery center. Remember to brush your teeth with toothpaste.   Please read over the following fact sheets that you were given. MRSA Information    How to Manage Your Diabetes Before and After Surgery  Why is it important to control my blood sugar before and after surgery? . Improving blood sugar levels before and after surgery helps healing and can limit problems. . A way of improving blood sugar control is eating a healthy diet by: o  Eating less sugar and carbohydrates o  Increasing  activity/exercise o  Talking with your doctor about reaching your blood sugar goals . High blood sugars (greater than 180 mg/dL) can raise your risk of infections and slow your recovery, so you will need to focus on controlling your diabetes during the weeks before surgery. . Make sure that the doctor who takes care of your diabetes knows about your planned surgery including the date and location.  How do I manage my blood sugar before surgery? . Check your blood sugar at least 4 times a day, starting 2 days before surgery, to make sure that the level is not too high or low. o Check your blood sugar the morning of your surgery when you wake up and every 2 hours until you get to the Short Stay unit. . If your blood sugar is less than 70 mg/dL, you will need to treat for low blood sugar: o Do not take insulin. o Treat a low blood sugar (less than 70 mg/dL) with  cup of clear juice (cranberry or apple), 4 glucose tablets, OR glucose gel. Recheck blood sugar in 15 minutes after treatment (to make sure it is greater than 70 mg/dL). If your blood sugar is not greater than 70 mg/dL on recheck, call [phone] o  for further instructions. . Report your blood sugar to the short stay nurse when you get to Short Stay.  . If you are admitted to the hospital after surgery: o Your blood sugar will be checked by the staff and you will probably be given insulin after surgery (instead of oral diabetes medicines) to make sure you have good blood sugar levels. o The goal for blood sugar control after surgery is 80-180 mg/dL.              WHAT DO I DO ABOUT MY DIABETES MEDICATION?   Marland Kitchen Do not take oral diabetes medicines (pills) the morning of surgery.  . THE NIGHT BEFORE SURGERY, take ___________ units of ___________insulin.       . THE MORNING OF SURGERY, take _____________ units of __________insulin.  . The day of surgery, do not take other diabetes injectables, including Byetta (exenatide),  Bydureon (exenatide ER), Victoza (liraglutide), or Trulicity (dulaglutide).  . If your CBG is greater than 220 mg/dL, you may take  of your sliding scale (correction) dose of insulin.  Other Instructions:          Patient Signature:  Date:   Nurse Signature:  Date:   Reviewed and Endorsed by [facility] Patient Education Committee, August 2015

## 2018-11-06 ENCOUNTER — Other Ambulatory Visit: Payer: Self-pay | Admitting: *Deleted

## 2018-11-06 ENCOUNTER — Other Ambulatory Visit: Payer: Self-pay

## 2018-11-06 ENCOUNTER — Encounter (HOSPITAL_COMMUNITY): Payer: Self-pay

## 2018-11-06 ENCOUNTER — Encounter (HOSPITAL_COMMUNITY)
Admission: RE | Admit: 2018-11-06 | Discharge: 2018-11-06 | Disposition: A | Discharge status: Home / Self Care | Payer: Medicare Other | Source: Ambulatory Visit | Attending: Vascular Surgery | Admitting: Vascular Surgery

## 2018-11-06 DIAGNOSIS — Z7902 Long term (current) use of antithrombotics/antiplatelets: Secondary | ICD-10-CM | POA: Insufficient documentation

## 2018-11-06 DIAGNOSIS — I63542 Cerebral infarction due to unspecified occlusion or stenosis of left cerebellar artery: Secondary | ICD-10-CM | POA: Diagnosis not present

## 2018-11-06 DIAGNOSIS — Z886 Allergy status to analgesic agent status: Secondary | ICD-10-CM | POA: Diagnosis not present

## 2018-11-06 DIAGNOSIS — Z79899 Other long term (current) drug therapy: Secondary | ICD-10-CM | POA: Diagnosis not present

## 2018-11-06 DIAGNOSIS — Z01812 Encounter for preprocedural laboratory examination: Secondary | ICD-10-CM | POA: Insufficient documentation

## 2018-11-06 HISTORY — DX: Hyperlipidemia, unspecified: E78.5

## 2018-11-06 LAB — CBC
HCT: 39.5 % (ref 36.0–46.0)
Hemoglobin: 13.4 g/dL (ref 12.0–15.0)
MCH: 30.5 pg (ref 26.0–34.0)
MCHC: 33.9 g/dL (ref 30.0–36.0)
MCV: 90 fL (ref 80.0–100.0)
Platelets: 265 10*3/uL (ref 150–400)
RBC: 4.39 MIL/uL (ref 3.87–5.11)
RDW: 12.7 % (ref 11.5–15.5)
WBC: 5.3 10*3/uL (ref 4.0–10.5)
nRBC: 0 % (ref 0.0–0.2)

## 2018-11-06 LAB — COMPREHENSIVE METABOLIC PANEL
ALT: 27 U/L (ref 0–44)
AST: 22 U/L (ref 15–41)
Albumin: 3.9 g/dL (ref 3.5–5.0)
Alkaline Phosphatase: 58 U/L (ref 38–126)
Anion gap: 10 (ref 5–15)
BUN: 14 mg/dL (ref 8–23)
CO2: 21 mmol/L — ABNORMAL LOW (ref 22–32)
Calcium: 9.8 mg/dL (ref 8.9–10.3)
Chloride: 108 mmol/L (ref 98–111)
Creatinine, Ser: 0.82 mg/dL (ref 0.44–1.00)
GFR calc Af Amer: >60 mL/min (ref >60)
GFR calc non Af Amer: >60 mL/min (ref >60)
Glucose, Bld: 198 mg/dL — ABNORMAL HIGH (ref 70–99)
Potassium: 4.4 mmol/L (ref 3.5–5.1)
Sodium: 139 mmol/L (ref 135–145)
Total Bilirubin: 0.6 mg/dL (ref 0.3–1.2)
Total Protein: 7.5 g/dL (ref 6.5–8.1)

## 2018-11-06 LAB — TYPE AND SCREEN
ABO/RH(D): B POS
Antibody Screen: NEGATIVE

## 2018-11-06 LAB — URINALYSIS, ROUTINE W REFLEX MICROSCOPIC
Bilirubin Urine: NEGATIVE
Glucose, UA: NEGATIVE mg/dL
Hgb urine dipstick: NEGATIVE
Ketones, ur: NEGATIVE mg/dL
Leukocytes,Ua: NEGATIVE
Nitrite: NEGATIVE
Protein, ur: NEGATIVE mg/dL
Specific Gravity, Urine: 1.003 — ABNORMAL LOW (ref 1.005–1.030)
pH: 5 (ref 5.0–8.0)

## 2018-11-06 LAB — PROTIME-INR
INR: 0.9 (ref 0.8–1.2)
Prothrombin Time: 12.5 seconds (ref 11.4–15.2)

## 2018-11-06 LAB — SURGICAL PCR SCREEN
MRSA, PCR: NEGATIVE
Staphylococcus aureus: NEGATIVE

## 2018-11-06 LAB — GLUCOSE, CAPILLARY: Glucose-Capillary: 186 mg/dL — ABNORMAL HIGH (ref 70–99)

## 2018-11-06 LAB — APTT: aPTT: 26 seconds (ref 24–36)

## 2018-11-06 NOTE — Pre-Procedure Instructions (Signed)
Martell Racca  11/06/2018      [id number] - [place], [place] - [address] AT [place] [address] [place] [address] Phone: [phone] Fax: [phone]    Your procedure is scheduled on 11/13/18.  Report to [facility] Admitting at 630 A.M.  Call this number if you have problems the morning of surgery:  [phone]   Remember:  Do not eat or drink after midnight.   Take these medicines the morning of surgery with A SIP OF WATER ----NONE    Do not wear jewelry, make-up or nail polish.  Do not wear lotions, powders, perfumes, or deodorant.  Do not shave 48 hours prior to surgery.    Do not bring valuables to the hospital.  [facility] is not responsible for any belongings or valuables.  Contacts, dentures or bridgework may not be worn into surgery.  Leave your suitcase in the car.  After surgery it may be brought to your room.  For patients admitted to the hospital, discharge time will be determined by your treatment team.  Patients discharged the day of surgery will not be allowed to drive home.   Do not take any aspirin,anti-inflammatories,vitamins,or herbal supplements 5-7 days prior to surgery.  [place] - Preparing for Surgery  Before surgery, you can play an important role.  Because skin is not sterile, your skin needs to be as free of germs as possible.  You can reduce the number of germs on you skin by washing with CHG (chlorahexidine gluconate) soap before surgery.  CHG is an antiseptic cleaner which kills germs and bonds with the skin to continue killing germs even after washing.  Oral Hygiene is also important in reducing the risk of infection.  Remember to brush your teeth with your regular toothpaste the morning of surgery.  Please DO NOT use if you have an allergy to CHG or antibacterial soaps.  If your skin becomes reddened/irritated stop using the CHG and inform your nurse when you arrive  at Short Stay.  Do not shave (including legs and underarms) for at least 48 hours prior to the first CHG shower.  You may shave your face.  Please follow these instructions carefully:   1.  Shower with CHG Soap the night before surgery and the morning of Surgery.  2.  If you choose to wash your hair, wash your hair first as usual with your normal shampoo.  3.  After you shampoo, rinse your hair and body thoroughly to remove the shampoo. 4.  Use CHG as you would any other liquid soap.  You can apply chg directly to the skin and wash gently with a      scrungie or washcloth.           5.  Apply the CHG Soap to your body ONLY FROM THE NECK DOWN.   Do not use on open wounds or open sores. Avoid contact with your eyes, ears, mouth and genitals (private parts).  Wash genitals (private parts) with your normal soap.  6.  Wash thoroughly, paying special attention to the area where your surgery will be performed.  7.  Thoroughly rinse your body with warm water from the neck down.  8.  DO NOT shower/wash with your normal soap after using and rinsing off the CHG Soap.  9.  Pat yourself dry with a clean towel.  10.  Wear clean pajamas.            11.  Place clean sheets on your bed the night of your first shower and do not sleep with pets.  Day of Surgery  Do not apply any lotions/deoderants the morning of surgery.   Please wear clean clothes to the hospital/surgery center. Remember to brush your teeth with toothpaste.   Please read over the following fact sheets that you were given. MRSA Information    How to Manage Your Diabetes Before and After Surgery  Why is it important to control my blood sugar before and after surgery? . Improving blood sugar levels before and after surgery helps healing and can limit problems. . A way of improving blood sugar control is eating a healthy diet by: o  Eating less sugar and carbohydrates o  Increasing activity/exercise o  Talking with your  doctor about reaching your blood sugar goals . High blood sugars (greater than 180 mg/dL) can raise your risk of infections and slow your recovery, so you will need to focus on controlling your diabetes during the weeks before surgery. . Make sure that the doctor who takes care of your diabetes knows about your planned surgery including the date and location.  How do I manage my blood sugar before surgery? . Check your blood sugar at least 4 times a day, starting 2 days before surgery, to make sure that the level is not too high or low. o Check your blood sugar the morning of your surgery when you wake up and every 2 hours until you get to the Short Stay unit. . If your blood sugar is less than 70 mg/dL, you will need to treat for low blood sugar: o Do not take insulin. o Treat a low blood sugar (less than 70 mg/dL) with  cup of clear juice (cranberry or apple), 4 glucose tablets, OR glucose gel. Recheck blood sugar in 15 minutes after treatment (to make sure it is greater than 70 mg/dL). If your blood sugar is not greater than 70 mg/dL on recheck, call [phone] o  for further instructions. . Report your blood sugar to the short stay nurse when you get to Short Stay.  . If you are admitted to the hospital after surgery: o Your blood sugar will be checked by the staff and you will probably be given insulin after surgery (instead of oral diabetes medicines) to make sure you have good blood sugar levels. o The goal for blood sugar control after surgery is 80-180 mg/dL.       WHAT DO I DO ABOUT MY DIABETES MEDICATION?   Marland Kitchen Do not take oral diabetes medicines (Glipizide) the morning of surgery.  . THE NIGHT BEFORE SURGERY, take 50% of dinner/bedtime dose Insulin glargine . Do not take Tuesday evening does of glipizide.       Other Instructions:          Patient Signature:  Date:   Nurse Signature:  Date:   Reviewed and Endorsed by [facility] Patient Education Committee,  August 2015

## 2018-11-06 NOTE — Progress Notes (Signed)
Patient denies shortness of breath, fever, cough and chest pain.  PCP - Dr [name] Copland Cardiologist - Denies  Chest x-ray - 09/19/18 EKG - 09/20/18 Stress Test - denies ECHO - 09/20/18 Cardiac Cath - denies  Checks blood sugar twice daily. Fasting range 100-200s  Blood Thinner Instructions: Per patient , MD instructions were to stop Plavix 2 days prior to surgery.  Last dose should be on Sunday, 11/10/18.  Anesthesia review: Yes  Coronavirus Screening Have you or experienced the following symptoms:  Cough yes/no: No Fever (>100.29F)  yes/no: No Runny nose yes/no: No Sore throat yes/no: No Difficulty breathing/shortness of breath  yes/no: No  Have you or  traveled in the last 14 days and where? yes/no: No   Covid test scheduled on Monday 11/11/18  Patient verbalized understanding of instructions that were given to them at the PAT appointment.

## 2018-11-07 NOTE — Anesthesia Preprocedure Evaluation (Addendum)
Anesthesia Evaluation  Patient identified by MRN, date of birth, ID band Patient awake    Reviewed: Allergy & Precautions, NPO status , Patient's Chart, lab work & pertinent test results  Airway Mallampati: II  TM Distance: >3 FB Neck ROM: Full    Dental no notable dental hx.    Pulmonary neg pulmonary ROS,    Pulmonary exam normal breath sounds clear to auscultation       Cardiovascular hypertension, Pt. on medications negative cardio ROS Normal cardiovascular exam Rhythm:Regular Rate:Normal     Neuro/Psych CVA negative psych ROS   GI/Hepatic negative GI ROS, Neg liver ROS,   Endo/Other  negative endocrine ROSdiabetes, Type 2  Renal/GU negative Renal ROS  negative genitourinary   Musculoskeletal  (+) Arthritis , Osteoarthritis,    Abdominal (+) + obese,   Peds negative pediatric ROS (+)  Hematology negative hematology ROS (+)   Anesthesia Other Findings   Reproductive/Obstetrics negative OB ROS                            Anesthesia Physical Anesthesia Plan  ASA: III  Anesthesia Plan: General   Post-op Pain Management:    Induction: Intravenous  PONV Risk Score and Plan: 3 and Ondansetron, Dexamethasone, Midazolam and Treatment may vary due to age or medical condition  Airway Management Planned: Oral ETT  Additional Equipment: Arterial line  Intra-op Plan:   Post-operative Plan: Extubation in OR  Informed Consent: I have reviewed the patients History and Physical, chart, labs and discussed the procedure including the risks, benefits and alternatives for the proposed anesthesia with the patient or authorized representative who has indicated his/her understanding and acceptance.     Dental advisory given  Plan Discussed with: CRNA  Anesthesia Plan Comments: (Admitted 9/10-9/14/20 for Acute ischemic left lateral medullary stroke. CT head without contrast negative. MRI  brain revealed acute left lateral medullary infarct. CTA head and neck revealed bilateral carotid artery and 50% proximal right subclavian artery stenosis. Echocardiogram revealed greater than 65% ejection fraction with impaired relaxation. Patient was managed with Plavix and statin.  Patient is allergic to aspirin.  Preop labs reviewed, elevated BG. Hx of IDDMII. Last A1c 8.0 on 09/20/18.  EKG 09/19/18: Sinus rhythm. Rate 88. Minimal ST depression, lateral leads.  TTE 09/20/18:  1. The left ventricle has hyperdynamic systolic function, with an ejection fraction of >65%. The cavity size was normal. There is moderate concentric left ventricular hypertrophy. Left ventricular diastolic Doppler parameters are consistent with  impaired relaxation. Indeterminate filling pressures No evidence of left ventricular regional wall motion abnormalities. An intraventricular "gradient" of 2 m/s at rest, 3.2 m/s with the Valsalva maneuver is noted.  2. The right ventricle has normal systolic function. The cavity was normal. There is no increase in right ventricular wall thickness. Right ventricular systolic pressure could not be assessed.  3. There is mild mitral annular calcification present.  4. The aorta is normal unless otherwise noted.  CTA head/neck 09/20/18: IMPRESSION: 1. No emergent finding. No high-grade V4 segment or inferior cerebellar stenosis to correlate with acute medullary infarct. 2. Bilateral cervical carotid atherosclerosis with 75% right and 70% left proximal ICA stenosis. 3. Extensive vertebral atherosclerosis with moderate or advanced narrowing at the origin of the non dominant right vertebral artery. 4. 50% proximal right subclavian stenosis. 5. 8 mm nodular calcification at the level of the deep left Meckel's cave, question meningioma.)       Anesthesia Quick Evaluation

## 2018-11-07 NOTE — Progress Notes (Signed)
Anesthesia Chart Review: Admitted 9/10-9/14/20 for Acute ischemic left lateral medullary stroke. CT head without contrast negative. MRI brain revealed acute left lateral medullary infarct. CTA head and neck revealed bilateral carotid artery and 50% proximal right subclavian artery stenosis. Echocardiogram revealed greater than 65% ejection fraction with impaired relaxation. Patient was managed with Plavix and statin.  Patient is allergic to aspirin.  Preop labs reviewed, elevated BG. Hx of IDDMII. Last A1c 8.0 on 09/20/18.  EKG 09/19/18: Sinus rhythm. Rate 88. Minimal ST depression, lateral leads.  TTE 09/20/18:  1. The left ventricle has hyperdynamic systolic function, with an ejection fraction of >65%. The cavity size was normal. There is moderate concentric left ventricular hypertrophy. Left ventricular diastolic Doppler parameters are consistent with  impaired relaxation. Indeterminate filling pressures No evidence of left ventricular regional wall motion abnormalities. An intraventricular "gradient" of 2 m/s at rest, 3.2 m/s with the Valsalva maneuver is noted.  2. The right ventricle has normal systolic function. The cavity was normal. There is no increase in right ventricular wall thickness. Right ventricular systolic pressure could not be assessed.  3. There is mild mitral annular calcification present.  4. The aorta is normal unless otherwise noted.  CTA head/neck 09/20/18: IMPRESSION: 1. No emergent finding. No high-grade V4 segment or inferior cerebellar stenosis to correlate with acute medullary infarct. 2. Bilateral cervical carotid atherosclerosis with 75% right and 70% left proximal ICA stenosis. 3. Extensive vertebral atherosclerosis with moderate or advanced narrowing at the origin of the non dominant right vertebral artery. 4. 50% proximal right subclavian stenosis. 5. 8 mm nodular calcification at the level of the deep left Meckel's cave, question meningioma.   Wynonia Musty [facility]/Anesthesiology Phone [phone] 11/07/2018 1:24 PM

## 2018-11-10 DIAGNOSIS — I6529 Occlusion and stenosis of unspecified carotid artery: Secondary | ICD-10-CM

## 2018-11-10 HISTORY — DX: Occlusion and stenosis of unspecified carotid artery: I65.29

## 2018-11-11 ENCOUNTER — Other Ambulatory Visit (HOSPITAL_COMMUNITY)
Admission: RE | Admit: 2018-11-11 | Discharge: 2018-11-11 | Disposition: A | Discharge status: Home / Self Care | Payer: Medicare Other | Source: Ambulatory Visit | Attending: Vascular Surgery | Admitting: Vascular Surgery

## 2018-11-11 LAB — SARS CORONAVIRUS 2 (TAT 6-24 HRS): SARS Coronavirus 2: NEGATIVE

## 2018-11-13 ENCOUNTER — Other Ambulatory Visit: Payer: Self-pay

## 2018-11-13 ENCOUNTER — Encounter (HOSPITAL_COMMUNITY)
Admission: RE | Disposition: A | Discharge status: Home / Self Care | Payer: Self-pay | Source: Home / Self Care | Attending: Vascular Surgery

## 2018-11-13 ENCOUNTER — Inpatient Hospital Stay (HOSPITAL_COMMUNITY): Payer: Medicare Other | Admitting: Physician Assistant

## 2018-11-13 ENCOUNTER — Encounter (HOSPITAL_COMMUNITY): Payer: Self-pay | Admitting: Certified Registered"

## 2018-11-13 ENCOUNTER — Inpatient Hospital Stay (HOSPITAL_COMMUNITY): Payer: Medicare Other | Admitting: Certified Registered"

## 2018-11-13 ENCOUNTER — Inpatient Hospital Stay (HOSPITAL_COMMUNITY)
Admission: RE | Admit: 2018-11-13 | Discharge: 2018-11-14 | DRG: 039 | Disposition: A | Discharge status: Home Health | Payer: Medicare Other | Attending: Vascular Surgery | Admitting: Vascular Surgery

## 2018-11-13 DIAGNOSIS — E669 Obesity, unspecified: Secondary | ICD-10-CM | POA: Diagnosis present

## 2018-11-13 DIAGNOSIS — Z79899 Other long term (current) drug therapy: Secondary | ICD-10-CM

## 2018-11-13 DIAGNOSIS — Z7902 Long term (current) use of antithrombotics/antiplatelets: Secondary | ICD-10-CM

## 2018-11-13 DIAGNOSIS — Z85828 Personal history of other malignant neoplasm of skin: Secondary | ICD-10-CM | POA: Diagnosis not present

## 2018-11-13 DIAGNOSIS — M1990 Unspecified osteoarthritis, unspecified site: Secondary | ICD-10-CM | POA: Diagnosis present

## 2018-11-13 DIAGNOSIS — Z886 Allergy status to analgesic agent status: Secondary | ICD-10-CM

## 2018-11-13 DIAGNOSIS — R3915 Urgency of urination: Secondary | ICD-10-CM | POA: Diagnosis present

## 2018-11-13 DIAGNOSIS — Z8051 Family history of malignant neoplasm of kidney: Secondary | ICD-10-CM

## 2018-11-13 DIAGNOSIS — Z800 Family history of malignant neoplasm of digestive organs: Secondary | ICD-10-CM

## 2018-11-13 DIAGNOSIS — Z6833 Body mass index (BMI) 33.0-33.9, adult: Secondary | ICD-10-CM | POA: Diagnosis not present

## 2018-11-13 DIAGNOSIS — E119 Type 2 diabetes mellitus without complications: Secondary | ICD-10-CM | POA: Diagnosis not present

## 2018-11-13 DIAGNOSIS — Z20828 Contact with and (suspected) exposure to other viral communicable diseases: Secondary | ICD-10-CM | POA: Diagnosis not present

## 2018-11-13 DIAGNOSIS — I6522 Occlusion and stenosis of left carotid artery: Secondary | ICD-10-CM | POA: Diagnosis not present

## 2018-11-13 DIAGNOSIS — Z888 Allergy status to other drugs, medicaments and biological substances status: Secondary | ICD-10-CM | POA: Diagnosis not present

## 2018-11-13 DIAGNOSIS — Z91013 Allergy to seafood: Secondary | ICD-10-CM

## 2018-11-13 DIAGNOSIS — Z794 Long term (current) use of insulin: Secondary | ICD-10-CM | POA: Diagnosis not present

## 2018-11-13 DIAGNOSIS — I10 Essential (primary) hypertension: Secondary | ICD-10-CM | POA: Diagnosis present

## 2018-11-13 DIAGNOSIS — Z8673 Personal history of transient ischemic attack (TIA), and cerebral infarction without residual deficits: Secondary | ICD-10-CM

## 2018-11-13 DIAGNOSIS — E785 Hyperlipidemia, unspecified: Secondary | ICD-10-CM | POA: Diagnosis not present

## 2018-11-13 DIAGNOSIS — I6529 Occlusion and stenosis of unspecified carotid artery: Secondary | ICD-10-CM | POA: Diagnosis present

## 2018-11-13 DIAGNOSIS — I63232 Cerebral infarction due to unspecified occlusion or stenosis of left carotid arteries: Secondary | ICD-10-CM | POA: Diagnosis not present

## 2018-11-13 HISTORY — PX: ENDARTERECTOMY: SHX5162

## 2018-11-13 HISTORY — PX: CAROTID ENDARTERECTOMY: SUR193

## 2018-11-13 LAB — CBC
HCT: 30.8 % — ABNORMAL LOW (ref 36.0–46.0)
Hemoglobin: 10.1 g/dL — ABNORMAL LOW (ref 12.0–15.0)
MCH: 30.2 pg (ref 26.0–34.0)
MCHC: 32.8 g/dL (ref 30.0–36.0)
MCV: 92.2 fL (ref 80.0–100.0)
Platelets: 204 10*3/uL (ref 150–400)
RBC: 3.34 MIL/uL — ABNORMAL LOW (ref 3.87–5.11)
RDW: 12.8 % (ref 11.5–15.5)
WBC: 9 10*3/uL (ref 4.0–10.5)
nRBC: 0 % (ref 0.0–0.2)

## 2018-11-13 LAB — CREATININE, SERUM
Creatinine, Ser: 0.74 mg/dL (ref 0.44–1.00)
GFR calc Af Amer: >60 mL/min (ref >60)
GFR calc non Af Amer: >60 mL/min (ref >60)

## 2018-11-13 LAB — GLUCOSE, CAPILLARY
Glucose-Capillary: 181 mg/dL — ABNORMAL HIGH (ref 70–99)
Glucose-Capillary: 172 mg/dL — ABNORMAL HIGH (ref 70–99)
Glucose-Capillary: 265 mg/dL — ABNORMAL HIGH (ref 70–99)

## 2018-11-13 LAB — POCT ACTIVATED CLOTTING TIME: Activated Clotting Time: 230 seconds

## 2018-11-13 SURGERY — ENDARTERECTOMY, CAROTID
Anesthesia: General | Laterality: Left

## 2018-11-13 MED ORDER — LACTATED RINGERS IV SOLN
INTRAVENOUS | Status: DC | PRN
  Administered 2018-11-13 (×2): via INTRAVENOUS

## 2018-11-13 MED ORDER — SUFENTANIL CITRATE 50 MCG/ML IV SOLN
INTRAVENOUS | Status: AC
  Filled 2018-11-13: qty 1

## 2018-11-13 MED ORDER — GLIPIZIDE ER 10 MG PO TB24
10.0000 mg | ORAL_TABLET | Freq: Every day | ORAL | Status: DC
  Administered 2018-11-14: 10 mg via ORAL
  Filled 2018-11-13: qty 1

## 2018-11-13 MED ORDER — DEXAMETHASONE SODIUM PHOSPHATE 10 MG/ML IJ SOLN
INTRAMUSCULAR | Status: DC | PRN
  Administered 2018-11-13: 4 mg via INTRAVENOUS

## 2018-11-13 MED ORDER — SUGAMMADEX SODIUM 200 MG/2ML IV SOLN
INTRAVENOUS | Status: DC | PRN
  Administered 2018-11-13: 100 mg via INTRAVENOUS

## 2018-11-13 MED ORDER — HEPARIN SODIUM (PORCINE) 1000 UNIT/ML IJ SOLN
INTRAMUSCULAR | Status: DC | PRN
  Administered 2018-11-13: 8000 [IU] via INTRAVENOUS

## 2018-11-13 MED ORDER — ALUM & MAG HYDROXIDE-SIMETH 200-200-20 MG/5ML PO SUSP: 15.0000 mL | ORAL | Status: DC | PRN

## 2018-11-13 MED ORDER — STERILE WATER FOR IRRIGATION IR SOLN
Status: DC | PRN
  Administered 2018-11-13: 1000 mL

## 2018-11-13 MED ORDER — OXYCODONE HCL 5 MG PO TABS: 5.0000 mg | ORAL_TABLET | Freq: Once | ORAL | Status: DC | PRN

## 2018-11-13 MED ORDER — DOCUSATE SODIUM 100 MG PO CAPS
100.0000 mg | ORAL_CAPSULE | Freq: Every day | ORAL | Status: DC
  Administered 2018-11-14: 100 mg via ORAL
  Filled 2018-11-13: qty 1

## 2018-11-13 MED ORDER — HEPARIN SODIUM (PORCINE) 5000 UNIT/ML IJ SOLN: 5000.0000 [IU] | Freq: Three times a day (TID) | INTRAMUSCULAR | Status: DC

## 2018-11-13 MED ORDER — CEFAZOLIN SODIUM-DEXTROSE 2-4 GM/100ML-% IV SOLN
2.0000 g | Freq: Three times a day (TID) | INTRAVENOUS | Status: AC
  Administered 2018-11-13 – 2018-11-14 (×2): 2 g via INTRAVENOUS
  Filled 2018-11-13 (×2): qty 100

## 2018-11-13 MED ORDER — SODIUM CHLORIDE 0.9 % IV SOLN
INTRAVENOUS | Status: DC
  Administered 2018-11-13: 15:00:00 via INTRAVENOUS

## 2018-11-13 MED ORDER — SODIUM CHLORIDE (PF) 0.9 % IJ SOLN
INTRAMUSCULAR | Status: AC
  Filled 2018-11-13: qty 10

## 2018-11-13 MED ORDER — ACETAMINOPHEN 325 MG PO TABS: 325.0000 mg | ORAL_TABLET | ORAL | Status: DC | PRN

## 2018-11-13 MED ORDER — ACETAMINOPHEN 325 MG RE SUPP: 325.0000 mg | RECTAL | Status: DC | PRN

## 2018-11-13 MED ORDER — SENNOSIDES-DOCUSATE SODIUM 8.6-50 MG PO TABS: 1.0000 | ORAL_TABLET | Freq: Every evening | ORAL | Status: DC | PRN

## 2018-11-13 MED ORDER — SODIUM CHLORIDE 0.9 % IV SOLN: INTRAVENOUS | Status: DC

## 2018-11-13 MED ORDER — PHENYLEPHRINE HCL-NACL 10-0.9 MG/250ML-% IV SOLN
INTRAVENOUS | Status: DC | PRN
  Administered 2018-11-13: 25 ug/min via INTRAVENOUS

## 2018-11-13 MED ORDER — PROMETHAZINE HCL 25 MG/ML IJ SOLN: 6.2500 mg | INTRAMUSCULAR | Status: DC | PRN

## 2018-11-13 MED ORDER — PROPOFOL 10 MG/ML IV BOLUS
INTRAVENOUS | Status: DC | PRN
  Administered 2018-11-13: 20 mg via INTRAVENOUS
  Administered 2018-11-13: 160 mg via INTRAVENOUS

## 2018-11-13 MED ORDER — CHLORHEXIDINE GLUCONATE CLOTH 2 % EX PADS: 6.0000 | MEDICATED_PAD | Freq: Once | CUTANEOUS | Status: DC

## 2018-11-13 MED ORDER — HYDROMORPHONE HCL 1 MG/ML IJ SOLN: 0.5000 mg | INTRAMUSCULAR | Status: DC | PRN

## 2018-11-13 MED ORDER — HYDROMORPHONE HCL 1 MG/ML IJ SOLN
0.2500 mg | INTRAMUSCULAR | Status: DC | PRN
  Administered 2018-11-13: 0.25 mg via INTRAVENOUS
  Administered 2018-11-13: 0.5 mg via INTRAVENOUS

## 2018-11-13 MED ORDER — CHLORHEXIDINE GLUCONATE 4 % EX LIQD: 60.0000 mL | Freq: Once | CUTANEOUS | Status: DC

## 2018-11-13 MED ORDER — PROTAMINE SULFATE 10 MG/ML IV SOLN
INTRAVENOUS | Status: DC | PRN
  Administered 2018-11-13: 50 mg via INTRAVENOUS

## 2018-11-13 MED ORDER — DEXAMETHASONE SODIUM PHOSPHATE 10 MG/ML IJ SOLN
INTRAMUSCULAR | Status: AC
  Filled 2018-11-13: qty 1

## 2018-11-13 MED ORDER — SODIUM CHLORIDE 0.9 % IV SOLN
INTRAVENOUS | Status: DC | PRN
  Administered 2018-11-13: 500 mL

## 2018-11-13 MED ORDER — LIDOCAINE HCL 1 % IJ SOLN
INTRAMUSCULAR | Status: AC
  Filled 2018-11-13: qty 20

## 2018-11-13 MED ORDER — ONDANSETRON HCL 4 MG/2ML IJ SOLN
INTRAMUSCULAR | Status: DC | PRN
  Administered 2018-11-13: 4 mg via INTRAVENOUS

## 2018-11-13 MED ORDER — SODIUM CHLORIDE 0.9 % IV SOLN: 500.0000 mL | Freq: Once | INTRAVENOUS | Status: DC | PRN

## 2018-11-13 MED ORDER — LIDOCAINE 2% (20 MG/ML) 5 ML SYRINGE
INTRAMUSCULAR | Status: DC | PRN
  Administered 2018-11-13: 60 mg via INTRAVENOUS

## 2018-11-13 MED ORDER — CEFAZOLIN SODIUM-DEXTROSE 2-4 GM/100ML-% IV SOLN
2.0000 g | INTRAVENOUS | Status: AC
  Administered 2018-11-13: 2 g via INTRAVENOUS

## 2018-11-13 MED ORDER — ONDANSETRON HCL 4 MG/2ML IJ SOLN
INTRAMUSCULAR | Status: AC
  Filled 2018-11-13: qty 2

## 2018-11-13 MED ORDER — SODIUM CHLORIDE 0.9 % IV SOLN
INTRAVENOUS | Status: AC
  Filled 2018-11-13: qty 1.2

## 2018-11-13 MED ORDER — HYDRALAZINE HCL 20 MG/ML IJ SOLN: 5.0000 mg | INTRAMUSCULAR | Status: DC | PRN

## 2018-11-13 MED ORDER — POTASSIUM CHLORIDE CRYS ER 20 MEQ PO TBCR: 20.0000 meq | EXTENDED_RELEASE_TABLET | Freq: Every day | ORAL | Status: DC | PRN

## 2018-11-13 MED ORDER — PROPOFOL 10 MG/ML IV BOLUS
INTRAVENOUS | Status: AC
  Filled 2018-11-13: qty 20

## 2018-11-13 MED ORDER — 0.9 % SODIUM CHLORIDE (POUR BTL) OPTIME
TOPICAL | Status: DC | PRN
  Administered 2018-11-13: 09:00:00 2000 mL

## 2018-11-13 MED ORDER — METOPROLOL TARTRATE 5 MG/5ML IV SOLN: 2.0000 mg | INTRAVENOUS | Status: DC | PRN

## 2018-11-13 MED ORDER — SUFENTANIL CITRATE 50 MCG/ML IV SOLN
INTRAVENOUS | Status: DC | PRN
  Administered 2018-11-13: 5 ug via INTRAVENOUS
  Administered 2018-11-13: 10 ug via INTRAVENOUS
  Administered 2018-11-13: 5 ug via INTRAVENOUS

## 2018-11-13 MED ORDER — OXYCODONE HCL 5 MG PO TABS: 5.0000 mg | ORAL_TABLET | ORAL | Status: DC | PRN

## 2018-11-13 MED ORDER — MAGNESIUM SULFATE 2 GM/50ML IV SOLN: 2.0000 g | Freq: Every day | INTRAVENOUS | Status: DC | PRN

## 2018-11-13 MED ORDER — CLOPIDOGREL BISULFATE 75 MG PO TABS
75.0000 mg | ORAL_TABLET | Freq: Every day | ORAL | Status: DC
  Administered 2018-11-14: 08:00:00 75 mg via ORAL
  Filled 2018-11-13: qty 1

## 2018-11-13 MED ORDER — ROCURONIUM BROMIDE 10 MG/ML (PF) SYRINGE
PREFILLED_SYRINGE | INTRAVENOUS | Status: DC | PRN
  Administered 2018-11-13: 50 mg via INTRAVENOUS

## 2018-11-13 MED ORDER — PHENOL 1.4 % MT LIQD: 1.0000 | OROMUCOSAL | Status: DC | PRN

## 2018-11-13 MED ORDER — GUAIFENESIN-DM 100-10 MG/5ML PO SYRP: 15.0000 mL | ORAL_SOLUTION | ORAL | Status: DC | PRN

## 2018-11-13 MED ORDER — NIACIN ER (ANTIHYPERLIPIDEMIC) 500 MG PO TBCR
500.0000 mg | EXTENDED_RELEASE_TABLET | Freq: Every day | ORAL | Status: DC
  Administered 2018-11-14: 500 mg via ORAL
  Filled 2018-11-13 (×2): qty 1

## 2018-11-13 MED ORDER — LABETALOL HCL 5 MG/ML IV SOLN: 10.0000 mg | INTRAVENOUS | Status: DC | PRN

## 2018-11-13 MED ORDER — CEFAZOLIN SODIUM-DEXTROSE 2-4 GM/100ML-% IV SOLN
INTRAVENOUS | Status: AC
  Filled 2018-11-13: qty 100

## 2018-11-13 MED ORDER — HEPARIN SODIUM (PORCINE) 1000 UNIT/ML IJ SOLN
INTRAMUSCULAR | Status: AC
  Filled 2018-11-13: qty 1

## 2018-11-13 MED ORDER — AMLODIPINE BESYLATE 10 MG PO TABS
10.0000 mg | ORAL_TABLET | Freq: Every day | ORAL | Status: DC
  Administered 2018-11-14: 10 mg via ORAL
  Filled 2018-11-13: qty 1

## 2018-11-13 MED ORDER — BISACODYL 5 MG PO TBEC: 5.0000 mg | DELAYED_RELEASE_TABLET | Freq: Every day | ORAL | Status: DC | PRN

## 2018-11-13 MED ORDER — ONDANSETRON HCL 4 MG/2ML IJ SOLN: 4.0000 mg | Freq: Four times a day (QID) | INTRAMUSCULAR | Status: DC | PRN

## 2018-11-13 MED ORDER — OXYCODONE HCL 5 MG/5ML PO SOLN: 5.0000 mg | Freq: Once | ORAL | Status: DC | PRN

## 2018-11-13 MED ORDER — ROCURONIUM BROMIDE 10 MG/ML (PF) SYRINGE
PREFILLED_SYRINGE | INTRAVENOUS | Status: AC
  Filled 2018-11-13: qty 10

## 2018-11-13 MED ORDER — HYDROCHLOROTHIAZIDE 25 MG PO TABS: 25.0000 mg | ORAL_TABLET | Freq: Every day | ORAL | Status: DC

## 2018-11-13 MED ORDER — HYDROMORPHONE HCL 1 MG/ML IJ SOLN
INTRAMUSCULAR | Status: AC
  Filled 2018-11-13: qty 1

## 2018-11-13 MED ORDER — PANTOPRAZOLE SODIUM 40 MG PO TBEC
40.0000 mg | DELAYED_RELEASE_TABLET | Freq: Every day | ORAL | Status: DC
  Filled 2018-11-13: qty 1

## 2018-11-13 SURGICAL SUPPLY — 44 items
CANISTER SUCT 3000ML PPV (MISCELLANEOUS) ×3 IMPLANT
CANNULA VESSEL 3MM 2 BLNT TIP (CANNULA) ×6 IMPLANT
CATH ROBINSON RED A/P 18FR (CATHETERS) ×3 IMPLANT
CLIP LIGATING EXTRA MED SLVR (CLIP) ×3 IMPLANT
CLIP LIGATING EXTRA SM BLUE (MISCELLANEOUS) ×3 IMPLANT
COVER WAND RF STERILE (DRAPES) ×1 IMPLANT
DECANTER SPIKE VIAL GLASS SM (MISCELLANEOUS) IMPLANT
DERMABOND ADVANCED (GAUZE/BANDAGES/DRESSINGS) ×2
DERMABOND ADVANCED .7 DNX12 (GAUZE/BANDAGES/DRESSINGS) ×1 IMPLANT
DRAIN HEMOVAC 1/8 X 5 (WOUND CARE) IMPLANT
ELECT REM PT RETURN 9FT ADLT (ELECTROSURGICAL) ×3
ELECTRODE REM PT RTRN 9FT ADLT (ELECTROSURGICAL) ×1 IMPLANT
EVACUATOR SILICONE 100CC (DRAIN) IMPLANT
GLOVE BIOGEL PI IND STRL 6.5 (GLOVE) IMPLANT
GLOVE BIOGEL PI IND STRL 7.0 (GLOVE) IMPLANT
GLOVE BIOGEL PI INDICATOR 6.5 (GLOVE) ×4
GLOVE BIOGEL PI INDICATOR 7.0 (GLOVE) ×4
GLOVE ECLIPSE 7.0 STRL STRAW (GLOVE) ×2 IMPLANT
GLOVE SS BIOGEL STRL SZ 7.5 (GLOVE) ×1 IMPLANT
GLOVE SUPERSENSE BIOGEL SZ 7.5 (GLOVE) ×2
GLOVE SURG SS PI 6.5 STRL IVOR (GLOVE) ×2 IMPLANT
GOWN STRL REUS W/ TWL LRG LVL3 (GOWN DISPOSABLE) ×3 IMPLANT
GOWN STRL REUS W/TWL LRG LVL3 (GOWN DISPOSABLE) ×6
KIT BASIN OR (CUSTOM PROCEDURE TRAY) ×3 IMPLANT
KIT SHUNT ARGYLE CAROTID ART 6 (VASCULAR PRODUCTS) IMPLANT
KIT TURNOVER KIT B (KITS) ×3 IMPLANT
NEEDLE 22X1 1/2 (OR ONLY) (NEEDLE) IMPLANT
NS IRRIG 1000ML POUR BTL (IV SOLUTION) ×6 IMPLANT
PACK CAROTID (CUSTOM PROCEDURE TRAY) ×3 IMPLANT
PAD ARMBOARD 7.5X6 YLW CONV (MISCELLANEOUS) ×6 IMPLANT
PATCH HEMASHIELD 8X75 (Vascular Products) ×2 IMPLANT
POSITIONER HEAD DONUT 9IN (MISCELLANEOUS) ×3 IMPLANT
SHUNT CAROTID BYPASS 10 (VASCULAR PRODUCTS) ×2 IMPLANT
SHUNT CAROTID BYPASS 12FRX15.5 (VASCULAR PRODUCTS) IMPLANT
SUT ETHILON 3 0 PS 1 (SUTURE) IMPLANT
SUT PROLENE 6 0 CC (SUTURE) ×5 IMPLANT
SUT SILK 3 0 (SUTURE)
SUT SILK 3-0 18XBRD TIE 12 (SUTURE) IMPLANT
SUT VIC AB 3-0 SH 27 (SUTURE) ×4
SUT VIC AB 3-0 SH 27X BRD (SUTURE) ×2 IMPLANT
SUT VICRYL 4-0 PS2 18IN ABS (SUTURE) ×3 IMPLANT
SYR CONTROL 10ML LL (SYRINGE) IMPLANT
TOWEL GREEN STERILE (TOWEL DISPOSABLE) ×3 IMPLANT
WATER STERILE IRR 1000ML POUR (IV SOLUTION) ×3 IMPLANT

## 2018-11-13 NOTE — Progress Notes (Signed)
    Comfortable A & O x 3 No neurologic deficits Left Neck incision minimal fullness posterior incision without expansion.  No frank hematoma.    Stable s/p left CEA Roxy Horseman PA-C

## 2018-11-13 NOTE — Op Note (Signed)
° °  OPERATIVE REPORT  DATE OF SURGERY: 11/13/2018  PATIENT: Wanda [name], 78 y.o. female MRN: [id number]  DOB: [birth year]  PRE-OPERATIVE DIAGNOSIS: Left Carotid Stenosis, Symptomatic  POST-OPERATIVE DIAGNOSIS:  Same  PROCEDURE:  Left Carotid Endarterectomy with Dacron Patch Angioplasty  SURGEON:  Curt Jews, M.D.  PHYSICIAN ASSISTANT: Collins  ANESTHESIA:   general  EBL: Less than 200 ml  Total I/O In: 1200 [I.V.:1200] Out: 125 [Blood:125]  BLOOD ADMINISTERED: none  DRAINS: none   SPECIMEN: none  COUNTS CORRECT:  YES  PLAN OF CARE: Admit to inpatient   PATIENT DISPOSITION:  PACU - hemodynamically stable and neurologically intact.  PROCEDURE DETAILS: The patient was taken to the operating room placed in supine position.  General anesthesia was administered.  The neck was prepped and draped in the usual sterile fashion.  An incision was made anterior to the sternocleidomastoid and carried down through the platysma with electrocautery.  The sternocleidomastoid was reflected posteriorly and the carotid sheath was opened.  The facial vein was ligated with 2-0 silk ties and divided.  The common carotid artery was encircled with an umbilical tape and Rummel tourniquet.  The vagus nerve was identified and preserved.  Dissection was continued onto the carotid bifurcation.  The superior thyroid artery was encircled with a 2-0 silk Potts tie.  The external carotid was encircled with a blue vessel loop and the internal carotid was encircled with an umbilical tape and Rummel tourniquet.  The hypoglossal nerve was identified and preserved.  The patient was given systemic heparin and after adequate circulation time, the internal, external and common carotid arteries were occluded with vascular clamps.  The common carotid artery was opened with an 11 blade and extended  longitudinally with Potts scissors.  A 10 shunt was passed up the internal carotid and allowed to backbleed.  It was then passed  down the common carotid where it was secured with Rummel tourniquet.  The endarterectomy was begun on the common carotid artery and the plaque was divided proximally with Potts scissors.  The endarterectomy was continued onto the bifurcation.  The external carotid was endarterectomized with an eversion technique and the internal carotid was endarterectomized in an open fashion.  Remaining atheromatous debris was removed from the endarterectomy plane.  A Finesse Hemashield Dacron patch was brought onto the field and was sewn as a patch angioplasty with a running 6-0 Prolene suture.  Prior to completion of the closure the shunt was removed and the usual flushing maneuvers were undertaken.  The anastomosis was completed and flow was restored first to the external and then the internal carotid artery.  Excellent flow characteristics were noted with hand-held Doppler in the internal and external carotid arteries.  The patient was given 50 mg of protamine to reverse the heparin.  The wounds were irrigated with saline.  Hemostasis was obtained with electrocautery.  The wounds were closed with 3-0 Vicryl to reapproximate the sternocleidomastoid over the carotid sheath.  The platysma was lysed with a running 3-0 Vicryl suture.  The skin was closed with a 4-0 subcuticular Vicryl stitch.  Dermabond was applied.  The patient was awakened neurologically intact in the operating room and transferred to the recovery room in stable condition   Curt Jews, M.D. 11/13/2018 11:15 AM

## 2018-11-13 NOTE — Anesthesia Procedure Notes (Signed)
Procedure Name: Intubation Date/Time: 11/13/2018 8:44 AM Performed by: Moshe Salisbury, CRNA Pre-anesthesia Checklist: Patient identified, Emergency Drugs available, Suction available and Patient being monitored Patient Re-evaluated:Patient Re-evaluated prior to induction Oxygen Delivery Method: Circle System Utilized Preoxygenation: Pre-oxygenation with 100% oxygen Induction Type: IV induction Ventilation: Mask ventilation without difficulty Laryngoscope Size: Mac and 3 Grade View: Grade II Tube type: Oral Tube size: 7.5 mm Number of attempts: 1 Airway Equipment and Method: Stylet Placement Confirmation: ETT inserted through vocal cords under direct vision,  positive ETCO2 and breath sounds checked- equal and bilateral Secured at: 21 cm Tube secured with: Tape Dental Injury: Teeth and Oropharynx as per pre-operative assessment

## 2018-11-13 NOTE — OR Nursing (Signed)
Post extubation patient able to follow commands, strength in bilateral arms and legs strong and at baseline; tongue midline.

## 2018-11-13 NOTE — Transfer of Care (Signed)
Immediate Anesthesia Transfer of Care Note  Patient: Wanda [name]  Procedure(s) Performed: Left Carotid Artery Endarterectomy with Patch Angioplasty (Left )  Patient Location: PACU  Anesthesia Type:General  Level of Consciousness: drowsy and patient cooperative  Airway & Oxygen Therapy: Patient Spontanous Breathing and Patient connected to nasal cannula oxygen  Post-op Assessment: Report given to RN, Post -op Vital signs reviewed and stable and Patient moving all extremities  Post vital signs: Reviewed and stable  Last Vitals:  Vitals Value Taken Time  BP    Temp    Pulse 76 11/13/18 1109  Resp 17 11/13/18 1109  SpO2 95 % 11/13/18 1109  Vitals shown include unvalidated device data.  Last Pain:  Vitals:   11/13/18 0642  TempSrc: Oral         Complications: No apparent anesthesia complications

## 2018-11-13 NOTE — Discharge Instructions (Signed)
   [facility]  Discharge Instructions   Carotid Endarterectomy (CEA)  Please refer to the following instructions for your post-procedure care. Your surgeon or physician assistant will discuss any changes with you.  Activity  You are encouraged to walk as much as you can. You can slowly return to normal activities but must avoid strenuous activity and heavy lifting until your doctor tell you it's OK. Avoid activities such as vacuuming or swinging a golf club. You can drive after one week if you are comfortable and you are no longer taking prescription pain medications. It is normal to feel tired for serval weeks after your surgery. It is also normal to have difficulty with sleep habits, eating, and bowel movements after surgery. These will go away with time.  Bathing/Showering  You may shower after you come home. Do not soak in a bathtub, hot tub, or swim until the incision heals completely.  Incision Care  Shower every day. Clean your incision with mild soap and water. Pat the area dry with a clean towel. You do not need a bandage unless otherwise instructed. Do not apply any ointments or creams to your incision. You may have skin glue on your incision. Do not peel it off. It will come off on its own in about one week. Your incision may feel thickened and raised for several weeks after your surgery. This is normal and the skin will soften over time. For Men Only: It's OK to shave around the incision but do not shave the incision itself for 2 weeks. It is common to have numbness under your chin that could last for several months.  Diet  Resume your normal diet. There are no special food restrictions following this procedure. A low fat/low cholesterol diet is recommended for all patients with vascular disease. In order to heal from your surgery, it is CRITICAL to get adequate nutrition. Your body requires vitamins, minerals, and protein. Vegetables are the best  source of vitamins and minerals. Vegetables also provide the perfect balance of protein. Processed food has little nutritional value, so try to avoid this.        Medications  Resume taking all of your medications unless your doctor or physician assistant tells you not to. If your incision is causing pain, you may take over-the- counter pain relievers such as acetaminophen (Tylenol). If you were prescribed a stronger pain medication, please be aware these medications can cause nausea and constipation. Prevent nausea by taking the medication with a snack or meal. Avoid constipation by drinking plenty of fluids and eating foods with a high amount of fiber, such as fruits, vegetables, and grains. Do not take Tylenol if you are taking prescription pain medications.  Follow Up  Our office will schedule a follow up appointment 2-3 weeks following discharge.  Please call us immediately for any of the following conditions  Increased pain, redness, drainage (pus) from your incision site. Fever of 101 degrees or higher. If you should develop stroke (slurred speech, difficulty swallowing, weakness on one side of your body, loss of vision) you should call 911 and go to the nearest emergency room.  Reduce your risk of vascular disease:  Stop smoking. If you would like help call [facility] at 1-800-QUIT-NOW (1-800-784-8669) or [place] at 336-586-4000. Manage your cholesterol Maintain a desired weight Control your diabetes Keep your blood pressure down  If you have any questions, please call the office at [phone].   

## 2018-11-13 NOTE — Anesthesia Postprocedure Evaluation (Signed)
Anesthesia Post Note  Patient: Wanda [name]  Procedure(s) Performed: Left Carotid Artery Endarterectomy with Patch Angioplasty (Left )     Patient location during evaluation: PACU Anesthesia Type: General Level of consciousness: awake and alert Pain management: pain level controlled Vital Signs Assessment: post-procedure vital signs reviewed and stable Respiratory status: spontaneous breathing, nonlabored ventilation and respiratory function stable Cardiovascular status: blood pressure returned to baseline and stable Postop Assessment: no apparent nausea or vomiting Anesthetic complications: no    Last Vitals:  Vitals:   11/13/18 1210 11/13/18 1225  BP: (!) 85/45 (!) 96/47  Pulse: 67 66  Resp: 19 18  Temp:    SpO2: 91% 92%    Last Pain:  Vitals:   11/13/18 1155  TempSrc:   PainSc: [place]

## 2018-11-13 NOTE — Interval H&P Note (Signed)
History and Physical Interval Note:  11/13/2018 8:18 AM  Wanda [name]  has presented today for surgery, with the diagnosis of LEFT CAROTID ARTERY STENOSIS  HISTORY OF CVA.  The various methods of treatment have been discussed with the patient and family. After consideration of risks, benefits and other options for treatment, the patient has consented to  Procedure(s): ENDARTERECTOMY CAROTID LEFT (Left) as a surgical intervention.  The patient's history has been reviewed, patient examined, no change in status, stable for surgery.  I have reviewed the patient's chart and labs.  Questions were answered to the patient's satisfaction.     Curt Jews

## 2018-11-13 NOTE — Anesthesia Procedure Notes (Signed)
Arterial Line Insertion Start/End11/04/2018 8:10 AM, 11/13/2018 8:20 AM Performed by: Moshe Salisbury, CRNA, CRNA  Patient location: Pre-op. Preanesthetic checklist: patient identified, IV checked, site marked, risks and benefits discussed, surgical consent, monitors and equipment checked, pre-op evaluation, timeout performed and anesthesia consent Patient sedated Right, radial was placed Catheter size: 20 G Hand hygiene performed , maximum sterile barriers used  and Seldinger technique used  Attempts: 1 Procedure performed without using ultrasound guided technique. Following insertion, dressing applied and Biopatch. Post procedure assessment: normal  Patient tolerated the procedure well with no immediate complications.

## 2018-11-14 ENCOUNTER — Inpatient Hospital Stay: Payer: Medicare Other | Admitting: Adult Health

## 2018-11-14 ENCOUNTER — Encounter (HOSPITAL_COMMUNITY): Payer: Self-pay | Admitting: General Practice

## 2018-11-14 ENCOUNTER — Other Ambulatory Visit: Payer: Self-pay

## 2018-11-14 LAB — CBC
HCT: 29.4 % — ABNORMAL LOW (ref 36.0–46.0)
Hemoglobin: 9.9 g/dL — ABNORMAL LOW (ref 12.0–15.0)
MCH: 30.7 pg (ref 26.0–34.0)
MCHC: 33.7 g/dL (ref 30.0–36.0)
MCV: 91 fL (ref 80.0–100.0)
Platelets: 197 10*3/uL (ref 150–400)
RBC: 3.23 MIL/uL — ABNORMAL LOW (ref 3.87–5.11)
RDW: 12.8 % (ref 11.5–15.5)
WBC: 8.5 10*3/uL (ref 4.0–10.5)
nRBC: 0 % (ref 0.0–0.2)

## 2018-11-14 LAB — BASIC METABOLIC PANEL
Anion gap: 7 (ref 5–15)
BUN: 14 mg/dL (ref 8–23)
CO2: 22 mmol/L (ref 22–32)
Calcium: 8.6 mg/dL — ABNORMAL LOW (ref 8.9–10.3)
Chloride: 111 mmol/L (ref 98–111)
Creatinine, Ser: 0.72 mg/dL (ref 0.44–1.00)
GFR calc Af Amer: >60 mL/min (ref >60)
GFR calc non Af Amer: >60 mL/min (ref >60)
Glucose, Bld: 177 mg/dL — ABNORMAL HIGH (ref 70–99)
Potassium: 3.8 mmol/L (ref 3.5–5.1)
Sodium: 140 mmol/L (ref 135–145)

## 2018-11-14 LAB — GLUCOSE, CAPILLARY: Glucose-Capillary: 170 mg/dL — ABNORMAL HIGH (ref 70–99)

## 2018-11-14 MED ORDER — OXYCODONE HCL 5 MG PO TABS: 5.0000 mg | ORAL_TABLET | Freq: Four times a day (QID) | ORAL | 0 refills | Status: DC | PRN

## 2018-11-14 NOTE — Progress Notes (Signed)
Order received to discharge patient.  Telemetry monitor removed and CCMD notified.  PIV access removed.  Discharge instructions, follow up, medications and instructions for their use discussed with patient. 

## 2018-11-14 NOTE — TOC Transition Note (Signed)
Transition of Care [facility]) - CM/SW Discharge Note Marvetta Gibbons RN, BSN Transitions of Care Unit 4E- RN Case Manager [phone]   Patient Details  Name: Wanda [name] MRN: [id number] Date of Birth: Jun 08, 1940  Transition of Care [facility]) CM/SW Contact:  Dawayne Patricia, RN Phone Number: 11/14/2018, 10:13 AM   Clinical Narrative:    Pt stable for transition home today s/p CEA, notified by Tiffany with [facility] that pt had pre-op referral for any HH needs- they will f/u with pt. Post discharge for any needs.    Final next level of care: [place] Barriers to Discharge: No Barriers Identified        Discharge Placement               Home with [facility]        Discharge Plan and Services                DME Arranged: N/A         HH Arranged: NA HH Agency: [facility] Date [place]: 11/14/18 Time [place]: 1013 Representative spoke with at [place]: [place] ([place]) Interventions     Readmission Risk Interventions No flowsheet data found.

## 2018-11-14 NOTE — Discharge Summary (Addendum)
  Progress Note    11/14/2018 7:19 AM 1 Day Post-Op  Subjective:  No further one sided weakness, changes in vision, or one sided weakness   Vitals:   11/14/18 0200 11/14/18 0621  BP: (!) 145/62 (!) 149/64  Pulse: 80 75  Resp: 19 18  Temp: 97.6 F (36.4 C) 97.9 F (36.6 C)  SpO2: 98%    Physical Exam: Lungs:  Non labored Incisions:  L neck incision with some collection however not tight Extremities:  Moving all extremities well Neurologic: A&O  CBC    Component Value Date/Time   WBC 8.5 11/14/2018 0545   RBC 3.23 (L) 11/14/2018 0545   HGB 9.9 (L) 11/14/2018 0545   HCT 29.4 (L) 11/14/2018 0545   PLT 197 11/14/2018 0545   MCV 91.0 11/14/2018 0545   MCV 90.8 06/19/2011 1602   MCH 30.7 11/14/2018 0545   MCHC 33.7 11/14/2018 0545   RDW 12.8 11/14/2018 0545   LYMPHSABS 1.1 09/19/2018 0739   MONOABS 0.5 09/19/2018 0739   EOSABS 0.1 09/19/2018 0739   BASOSABS 0.0 09/19/2018 0739    BMET    Component Value Date/Time   NA 140 11/14/2018 0545   K 3.8 11/14/2018 0545   CL 111 11/14/2018 0545   CO2 22 11/14/2018 0545   GLUCOSE 177 (H) 11/14/2018 0545   BUN 14 11/14/2018 0545   CREATININE 0.72 11/14/2018 0545   CREATININE 0.85 07/30/2013 1021   CALCIUM 8.6 (L) 11/14/2018 0545   GFRNONAA >60 11/14/2018 0545   GFRAA >60 11/14/2018 0545    INR    Component Value Date/Time   INR 0.9 11/06/2018 1133     Intake/Output Summary (Last 24 hours) at 11/14/2018 0719 Last data filed at 11/13/2018 1103 Gross per 24 hour  Intake 1200 ml  Output 125 ml  Net 1075 ml     Assessment/Plan:  78 y.o. female is s/p L CEA 1 Day Post-Op   Neuro exam remains at baseline [facility] for discharge home this morning Follow up with Dr. Donnetta Hutching in 2-3 weeks   Dagoberto Ligas, PA-C [facility] [phone] 11/14/2018 7:19 AM  I have examined the patient, reviewed and agree with above.  Curt Jews, MD 11/14/2018 7:52 AM

## 2018-11-15 ENCOUNTER — Telehealth: Payer: Self-pay

## 2018-11-15 DIAGNOSIS — M1712 Unilateral primary osteoarthritis, left knee: Secondary | ICD-10-CM | POA: Diagnosis not present

## 2018-11-15 DIAGNOSIS — Z794 Long term (current) use of insulin: Secondary | ICD-10-CM | POA: Diagnosis not present

## 2018-11-15 DIAGNOSIS — I10 Essential (primary) hypertension: Secondary | ICD-10-CM | POA: Diagnosis not present

## 2018-11-15 DIAGNOSIS — Z85828 Personal history of other malignant neoplasm of skin: Secondary | ICD-10-CM | POA: Diagnosis not present

## 2018-11-15 DIAGNOSIS — R202 Paresthesia of skin: Secondary | ICD-10-CM | POA: Diagnosis not present

## 2018-11-15 DIAGNOSIS — I69398 Other sequelae of cerebral infarction: Secondary | ICD-10-CM | POA: Diagnosis not present

## 2018-11-15 DIAGNOSIS — I6521 Occlusion and stenosis of right carotid artery: Secondary | ICD-10-CM | POA: Diagnosis not present

## 2018-11-15 DIAGNOSIS — E119 Type 2 diabetes mellitus without complications: Secondary | ICD-10-CM | POA: Diagnosis not present

## 2018-11-15 DIAGNOSIS — Z48812 Encounter for surgical aftercare following surgery on the circulatory system: Secondary | ICD-10-CM | POA: Diagnosis not present

## 2018-11-15 MED ORDER — CLONIDINE HCL 0.1 MG PO TABS: 0.1000 mg | ORAL_TABLET | Freq: Two times a day (BID) | ORAL | 3 refills | Status: DC

## 2018-11-15 NOTE — Telephone Encounter (Signed)
Called Mo and let her know plan, she had my cell

## 2018-11-15 NOTE — Telephone Encounter (Signed)
BP Readings from Last 3 Encounters:  11/14/18 (!) 153/64  11/06/18 (!) 174/76  10/29/18 (!) 160/83   Pulse Readings from Last 3 Encounters:  11/14/18 75  11/06/18 92  10/29/18 77   Called pt- she feels fine She is taking amlodipine 5 and also hctz 25 Allergy to ace, pulse borderline for BB Will add clonidine 0.1 BID Will update Dr Donnetta Hutching as well- she had endarterectomy 2 days ago  Vanita Ingles- let me know if you want me to do anything different [place]

## 2018-11-15 NOTE — Telephone Encounter (Signed)
Copied from [place] [id number]. Topic: General - Other >> Nov 15, 2018 12:23 PM Yvette Rack wrote: Reason for CRM: Mo with [facility] called to report that pt is experiencing irregular vital signs. Cb# [phone]

## 2018-11-15 NOTE — Addendum Note (Signed)
Addended by: Lamar Blinks C on: 11/15/2018 01:32 PM   Modules accepted: Orders

## 2018-11-15 NOTE — Telephone Encounter (Signed)
Called Wanda [name] back, patients blood pressure in right arm 170/90, and left arm 182/88. No other symptoms. She states she already took her norvasc for the morning two hours prior to reading. Please advise  On what patient is to do. I also will need to call Wanda [name] back to update her.

## 2018-11-16 ENCOUNTER — Encounter: Payer: Self-pay | Admitting: Family Medicine

## 2018-11-19 ENCOUNTER — Telehealth: Payer: Self-pay | Admitting: Family Medicine

## 2018-11-19 NOTE — Telephone Encounter (Signed)
Copied from [place] [id number]. Topic: General - Other >> Nov 19, 2018  3:15 PM Keene Breath wrote: Reason for CRM: Called to inform the nurse that the patient's BP is running a little low.  Would like to go over medication.  CB# [phone]

## 2018-11-20 ENCOUNTER — Encounter: Payer: Self-pay | Admitting: Family Medicine

## 2018-11-20 NOTE — Telephone Encounter (Signed)
Amlodipine 10 hctz 25 She notes that her BP dropped too low with clonidine- she took 0.1 this am and non since.  It does lower her BP but perhaps too much She is going to try splitting the clonidine into 2 if possible- we are not sure if the pill is too small. I will send her a mychart message that she can reply to with an update

## 2018-11-20 NOTE — Telephone Encounter (Signed)
Caller name: Danae Chen Relation to pt: RN from [facility]  Call back number: [phone]    Reason for call:  Nurse following up regarding adjusting patient cloNIDine (CATAPRES) 0.1 MG tablet stating patient BP last night was 117/61 and she did not take medication, this morning patient BP 153/76, when the nurse arrived this morning re check BP 148/74. Please advise and leave a detail message.

## 2018-11-25 ENCOUNTER — Telehealth: Payer: Self-pay | Admitting: Family Medicine

## 2018-11-25 NOTE — Telephone Encounter (Signed)
Called pt to discuss  She seems confused about her medications She is taking clonidine but stopped both amlodipine and hctz at some point in the last couple of months- although she told me on phone on 11/6 that she was taking them I asked her to continue clonidine and restart HCTZ Scheduled her for a visit Wednesday to go over her medications and check BP- asked her to be sure to bring all her medications with her

## 2018-11-25 NOTE — Telephone Encounter (Signed)
Erica from [facility] called.  She went out to see pt today.  She states that the pts bp was 160/78.  Readings from over the weekend pt reports were: 170/82 and 171/73.  Nurse was not at pt home at time of call.  Randall Hiss is not sure if pt is taking medication as directed since readings were high  Please call back at [phone] Danae Chen from [facility]

## 2018-11-25 NOTE — Telephone Encounter (Signed)
Per patients last message it looks like she is to be taking  Half a pill bid however the patient states one pill in morning and one half in evening. Could you clarify on dose of the clonidine?

## 2018-11-26 NOTE — Progress Notes (Signed)
[facility] at [facility], [place], [place] [address] [phone]  Date:  11/27/2018   Name:  Wanda [name]   DOB:  09-Sep-1940   MRN:  [id number]  PCP:  Darreld Mclean, MD    Chief Complaint: Medication Management and Hypertension   History of Present Illness:  Wanda [name] is a 78 y.o. very pleasant female patient who presents with the following:  Office visit today to discuss blood pressure She recently underwent a left carotid endarterectomy, and since getting home her home health nurse has noticed some difficulty in blood pressure control She had her endarterectomy 2 weeks ago I have been concerned that Wanda [name] was confused about what blood pressure medication she is taking and asked her to come in today with her medications so we can go over them  Lab Results  Component Value Date   HGBA1C 8.0 (H) 09/20/2018   Flu shot is declined today  She is taking clonidine 0.1 BID and also hctz 25 mg She has stopped taking both amlodipine and toprol sl due to concerns that these meds made her blood sugar high   She is allergic to ace which limits our treatment options somewhat, history of possible angioedema  Her neck pain-from recent operation-is finally resolving Overall she feels well She has plans to check her A1c next month  She brings in some home blood pressure readings, some are okay but several are too high--higher than reading today Patient Active Problem List   Diagnosis Date Noted  . Carotid stenosis 11/13/2018  . CVA (cerebral vascular accident) ([place]) 09/19/2018  . Brainstem infarct, acute ([place])   . Primary osteoarthritis of left knee 10/04/2015  . Osteoporosis 08/23/2015  . Multinodular goiter (nontoxic) 04/07/2014  . Diabetes mellitus type 2, uncontrolled, without complications 62/83/6629  . Insulin adverse reaction 09/27/2013  . Senile nuclear sclerosis 08/14/2012  . Essential  hypertension 03/10/2011  . Hyperlipidemia with target LDL less than 70 03/10/2011  . Obesity (BMI 30.0-34.9) 03/10/2011    Past Medical History:  Diagnosis Date  . Arthritis   . Carotid stenosis 11/2018  . Cataract    surgery to remove  . Diabetes mellitus    type 2  . Hyperlipidemia   . Hypertension   . Skin cancer    Removed from face  . Stroke ([place]) 09/19/2018  . Urgency of urination   . Urinary leakage     Past Surgical History:  Procedure Laterality Date  . ABDOMINAL HYSTERECTOMY  early 80's   total  . CAROTID ENDARTERECTOMY Left 11/13/2018  . CARPAL TUNNEL RELEASE Bilateral 1980 and 1981   both hands  . ENDARTERECTOMY Left 11/13/2018   Procedure: Left Carotid Artery Endarterectomy with Patch Angioplasty;  Surgeon: Rosetta Posner, MD;  Location: [facility];  Service: Vascular;  Laterality: Left;  . EYE SURGERY     bilateral cataracts  . Fatty Tumor Excision    . JOINT REPLACEMENT    . TONSILLECTOMY    . TONSILLECTOMY AND ADENOIDECTOMY  age 22  . TOTAL KNEE ARTHROPLASTY Right 10/04/2015  . TOTAL KNEE ARTHROPLASTY Right 10/04/2015   Procedure: TOTAL KNEE ARTHROPLASTY;  Surgeon: Dorna Leitz, MD;  Location: [facility];  Service: Orthopedics;  Laterality: Right;  . TOTAL KNEE ARTHROPLASTY Left 01/28/2016   Procedure: TOTAL KNEE ARTHROPLASTY;  Surgeon: Dorna Leitz, MD;  Location: [facility];  Service: Orthopedics;  Laterality: Left;  . TUBAL LIGATION  Social History   Tobacco Use  . Smoking status: Never Smoker  . Smokeless tobacco: Never Used  Substance Use Topics  . Alcohol use: Yes    Alcohol/week: 1.0 standard drinks    Types: 1 Standard drinks or equivalent per week    Comment: maybe once per month - 3 drinks  . Drug use: No    Family History  Problem Relation Age of Onset  . Pancreatitis Father        deceased 30  . Cancer Other   . Cancer Brother        GI  . Cancer Mother        liver  . Cancer Son        terminal kidney    Allergies  Allergen Reactions   . Ace Inhibitors Diarrhea, Swelling, Other (See Comments) and Cough    Pt had cough and diarrhea with first few doses of medication; also had swelling of right eyelid. Stopped medication on 03/17/11.  . Alendronate Sodium Other (See Comments)    "Made my whole body hurt"  . Aspirin Hives  . Crestor [Rosuvastatin] Other (See Comments)    "Made my whole body hurt"  . Lipitor [Atorvastatin Calcium] Other (See Comments)    "Made my whole body hurt"  . Metformin And Related     Severe abdominal pain  . Shellfish Allergy Nausea Only    Intolerant of fresh shellfish, reports the reaction is GI upset, denies hives, denies any swelling  Reports that she can tolerate canned seafood.     Medication list has been reviewed and updated.  Current Outpatient Medications on File Prior to Visit  Medication Sig Dispense Refill  . blood glucose meter kit and supplies Dispense based on patient and insurance preference. Pt just needs meter 1 each 0  . Blood Glucose Monitoring Suppl (ONE TOUCH ULTRA MINI) w/Device KIT Use to test blood sugar daily as instructed. Dx: E11.65 1 each 0  . cloNIDine (CATAPRES) 0.1 MG tablet Take 1 tablet (0.1 mg total) by mouth 2 (two) times daily. 60 tablet 3  . clopidogrel (PLAVIX) 75 MG tablet Take 1 tablet (75 mg total) by mouth daily. 90 tablet 1  . glipiZIDE (GLUCOTROL XL) 5 MG 24 hr tablet Take 10 mg am and 5 mg pm for diabetes (Patient taking differently: Take 10 mg by mouth daily with breakfast. ) 90 tablet 6  . glucose blood (ONE TOUCH ULTRA TEST) test strip Test blood sugar 3 times a day. Dx code: 250.00 100 each 12  . hydrochlorothiazide (HYDRODIURIL) 25 MG tablet Take 1 tablet (25 mg total) by mouth daily. 90 tablet 3  . Insulin Glargine (LANTUS SOLOSTAR) 100 UNIT/ML Solostar Pen Inject 15-20 Units into the skin daily. (Patient taking differently: Inject 10-20 Units into the skin See admin instructions. Inject 10-20 units into the skin at bedtime, per sliding scale) 5  pen 11  . Insulin Pen Needle (BD PEN NEEDLE NANO U/F) 32G X 4 MM MISC USE TO INJECT INSULIN ONCE DAILY 100 each 6  . Insulin Syringe-Needle U-100 (INSULIN SYRINGE .5CC/31GX5/16") 31G X 5/16" 0.5 ML MISC Use to inject insulin 1 time daily. 90 each 3  . niacin (NIASPAN) 500 MG CR tablet Take 1 tablet (500 mg total) by mouth at bedtime. Increase to 2 tablets after 1 month (Patient taking differently: Take 500 mg by mouth daily. ) 180 tablet 2  . oxyCODONE (OXY IR/ROXICODONE) 5 MG immediate release tablet Take 1 tablet (5 mg total)  by mouth every 6 (six) hours as needed for moderate pain. 15 tablet 0  . vitamin E 200 UNIT capsule Take 200 Units by mouth daily.     Marland Kitchen amLODipine (NORVASC) 10 MG tablet Take 1 tablet (10 mg total) by mouth daily. (Patient not taking: Reported on 11/27/2018) 90 tablet 3  . [DISCONTINUED] lisinopril (PRINIVIL,ZESTRIL) 20 MG tablet Take 20 mg by mouth daily.      No current facility-administered medications on file prior to visit.     Review of Systems:  As per HPI- otherwise negative. No fever or chills  Physical Examination: Vitals:   11/27/18 1057  BP: (!) 152/70  Pulse: 74  Resp: 17  Temp: 97.8 F (36.6 C)  SpO2: 96%   Vitals:   11/27/18 1057  Weight: 171 lb (77.6 kg)  Height: 5' 1"  (1.549 m)   Body mass index is 32.31 kg/m. Ideal Body Weight: Weight in (lb) to have BMI = 25: 132  GEN: WDWN, NAD, Non-toxic, A & O x 3, obese, looks well HEENT: Atraumatic, Normocephalic. Neck supple. No masses, No LAD. Healing operative site over left carotid artery-no sign of infection Ears and Nose: No external deformity. CV: RRR, No M/G/R. No JVD. No thrill. No extra heart sounds. PULM: CTA B, no wheezes, crackles, rhonchi. No retractions. No resp. distress. No accessory muscle use. ABD: S, NT, ND EXTR: No c/c/e NEURO Normal gait.  PSYCH: Normally interactive. Conversant. Not depressed or anxious appearing.  Calm demeanor.    Assessment and Plan: Essential  hypertension - Plan: amLODipine (NORVASC) 2.5 MG tablet  Here today to discuss her blood pressure.  Wanda [name] brought in all of her medications and we went over them in detail.  She has been taking hydrochlorothiazide and clonidine, but not metoprolol or amlodipine.  She feels that both metoprolol and amlodipine make her blood sugar high.  We discussed that this typically would not be the case.  She is willing to try adding back a low dose of amlodipine, will try adding 2.5 mg She will let me know how her blood pressure responds via MyChart Signed Lamar Blinks, MD

## 2018-11-27 ENCOUNTER — Ambulatory Visit (INDEPENDENT_AMBULATORY_CARE_PROVIDER_SITE_OTHER): Payer: Medicare Other | Admitting: Family Medicine

## 2018-11-27 ENCOUNTER — Other Ambulatory Visit: Payer: Medicare Other

## 2018-11-27 ENCOUNTER — Other Ambulatory Visit: Payer: Self-pay

## 2018-11-27 ENCOUNTER — Encounter: Payer: Self-pay | Admitting: Family Medicine

## 2018-11-27 DIAGNOSIS — I639 Cerebral infarction, unspecified: Secondary | ICD-10-CM | POA: Diagnosis not present

## 2018-11-27 DIAGNOSIS — I10 Essential (primary) hypertension: Secondary | ICD-10-CM | POA: Diagnosis not present

## 2018-11-27 MED ORDER — AMLODIPINE BESYLATE 2.5 MG PO TABS: 2.5000 mg | ORAL_TABLET | Freq: Every day | ORAL | 3 refills | Status: DC

## 2018-11-27 NOTE — Patient Instructions (Signed)
Good to see you again today!   For BP, we are taking Hydrochlorothiazide 25 mg daily Clonidine 0.1 twice a day ADDING amlodipine 2.5 mg daily  You are NOT taking metoprolol xl for BP at this time

## 2018-12-03 ENCOUNTER — Ambulatory Visit (INDEPENDENT_AMBULATORY_CARE_PROVIDER_SITE_OTHER): Payer: Self-pay | Admitting: Physician Assistant

## 2018-12-03 ENCOUNTER — Other Ambulatory Visit: Payer: Self-pay

## 2018-12-03 DIAGNOSIS — Z9889 Other specified postprocedural states: Secondary | ICD-10-CM

## 2018-12-03 NOTE — Progress Notes (Signed)
POST OPERATIVE OFFICE NOTE    CC:  F/u for surgery  HPI:  This is a 78 y.o. female who is s/p left carotid endarterectomy with dacron patch angioplasty by Dr. Donnetta Hutching on 11/13/2018 for symptomatic carotid stenosis (dysarthria and paresthesia in her right arm).  She presents today for follow up.  She states she still has some numbness in her right arm but she feels this may be a little bit better.  She states she has some numbness under her mandible.  She states that her pain medication did not work after surgery but her pain is much better.  She states she had a fair amount of pain afterward.    She was recently seen by PCP about her blood pressure control.    Allergies  Allergen Reactions  . Ace Inhibitors Diarrhea, Swelling, Other (See Comments) and Cough    Pt had cough and diarrhea with first few doses of medication; also had swelling of right eyelid. Stopped medication on 03/17/11.  . Alendronate Sodium Other (See Comments)    "Made my whole body hurt"  . Aspirin Hives  . Crestor [Rosuvastatin] Other (See Comments)    "Made my whole body hurt"  . Lipitor [Atorvastatin Calcium] Other (See Comments)    "Made my whole body hurt"  . Metformin And Related     Severe abdominal pain  . Shellfish Allergy Nausea Only    Intolerant of fresh shellfish, reports the reaction is GI upset, denies hives, denies any swelling  Reports that she can tolerate canned seafood.     Current Outpatient Medications  Medication Sig Dispense Refill  . amLODipine (NORVASC) 2.5 MG tablet Take 1 tablet (2.5 mg total) by mouth daily. 90 tablet 3  . blood glucose meter kit and supplies Dispense based on patient and insurance preference. Pt just needs meter 1 each 0  . Blood Glucose Monitoring Suppl (ONE TOUCH ULTRA MINI) w/Device KIT Use to test blood sugar daily as instructed. Dx: E11.65 1 each 0  . cloNIDine (CATAPRES) 0.1 MG tablet Take 1 tablet (0.1 mg total) by mouth 2 (two) times daily. (Patient taking  differently: Take 0.1 mg by mouth 2 (two) times daily. ONE WHOLE TABLET BID) 60 tablet 3  . clopidogrel (PLAVIX) 75 MG tablet Take 1 tablet (75 mg total) by mouth daily. 90 tablet 1  . glipiZIDE (GLUCOTROL XL) 5 MG 24 hr tablet Take 10 mg am and 5 mg pm for diabetes (Patient taking differently: Take 10 mg by mouth daily with breakfast. ) 90 tablet 6  . glucose blood (ONE TOUCH ULTRA TEST) test strip Test blood sugar 3 times a day. Dx code: 250.00 100 each 12  . hydrochlorothiazide (HYDRODIURIL) 25 MG tablet Take 1 tablet (25 mg total) by mouth daily. (Patient not taking: Reported on 11/27/2018) 90 tablet 3  . Insulin Glargine (LANTUS SOLOSTAR) 100 UNIT/ML Solostar Pen Inject 15-20 Units into the skin daily. (Patient taking differently: Inject 10-20 Units into the skin See admin instructions. Inject 10-20 units into the skin at bedtime, per sliding scale) 5 pen 11  . Insulin Pen Needle (BD PEN NEEDLE NANO U/F) 32G X 4 MM MISC USE TO INJECT INSULIN ONCE DAILY 100 each 6  . Insulin Syringe-Needle U-100 (INSULIN SYRINGE .5CC/31GX5/16") 31G X 5/16" 0.5 ML MISC Use to inject insulin 1 time daily. 90 each 3  . niacin (NIASPAN) 500 MG CR tablet Take 1 tablet (500 mg total) by mouth at bedtime. Increase to 2 tablets after 1 month (  Patient taking differently: Take 500 mg by mouth daily. ) 180 tablet 2  . oxyCODONE (OXY IR/ROXICODONE) 5 MG immediate release tablet Take 1 tablet (5 mg total) by mouth every 6 (six) hours as needed for moderate pain. 15 tablet 0  . vitamin E 200 UNIT capsule Take 200 Units by mouth daily.      No current facility-administered medications for this visit.      ROS:  See HPI  Physical Exam:   Incision:  Healing nicely Extremities:  Moving all extremities equally Neuro: in tact; tongue is midline; smile symmetrical   Assessment/Plan:  This is a 78 y.o. female who is s/p: left carotid endarterectomy with dacron patch angioplasty by Dr. Donnetta Hutching on 11/13/2018 for symptomatic  carotid stenosis (dysarthria and paresthesia in her right arm).  -pt doing well since surgery.  She did have some pain afterwards, but this has resolved.  She states that her right arm numbness is starting to get better. Her neuro exam is in tact today.  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  -will have her f/u in 9 months with carotid duplex.  She will call sooner should she have any issues.  If she develops any stroke sx, she will call [phone].    Leontine Locket, PA-C [facility] [phone]  Clinic MD:  Pt seen in conjunction with Dr. Donnetta Hutching

## 2018-12-08 ENCOUNTER — Encounter: Payer: Self-pay | Admitting: Family Medicine

## 2018-12-09 ENCOUNTER — Encounter: Payer: Self-pay | Admitting: Family Medicine

## 2018-12-09 ENCOUNTER — Ambulatory Visit (INDEPENDENT_AMBULATORY_CARE_PROVIDER_SITE_OTHER): Payer: Medicare Other | Admitting: Family Medicine

## 2018-12-09 ENCOUNTER — Other Ambulatory Visit: Payer: Self-pay

## 2018-12-09 VITALS — BP 148/72 | HR 67 | Temp 98.2°F | Resp 18 | Ht 61.0 in | Wt 167.0 lb

## 2018-12-09 DIAGNOSIS — I639 Cerebral infarction, unspecified: Secondary | ICD-10-CM

## 2018-12-09 DIAGNOSIS — E118 Type 2 diabetes mellitus with unspecified complications: Secondary | ICD-10-CM

## 2018-12-09 DIAGNOSIS — R350 Frequency of micturition: Secondary | ICD-10-CM | POA: Diagnosis not present

## 2018-12-09 DIAGNOSIS — M791 Myalgia, unspecified site: Secondary | ICD-10-CM | POA: Diagnosis not present

## 2018-12-09 DIAGNOSIS — I6302 Cerebral infarction due to thrombosis of basilar artery: Secondary | ICD-10-CM | POA: Diagnosis not present

## 2018-12-09 DIAGNOSIS — I10 Essential (primary) hypertension: Secondary | ICD-10-CM

## 2018-12-09 LAB — CBC
HCT: 34.9 % — ABNORMAL LOW (ref 36.0–46.0)
Hemoglobin: 11.8 g/dL — ABNORMAL LOW (ref 12.0–15.0)
MCHC: 33.9 g/dL (ref 30.0–36.0)
MCV: 90 fl (ref 78.0–100.0)
Platelets: 247 10*3/uL (ref 150.0–400.0)
RBC: 3.88 Mil/uL (ref 3.87–5.11)
RDW: 13.8 % (ref 11.5–15.5)
WBC: 5.1 10*3/uL (ref 4.0–10.5)

## 2018-12-09 LAB — CK: Total CK: 111 U/L (ref 7–177)

## 2018-12-09 LAB — HEMOGLOBIN A1C: Hgb A1c MFr Bld: 8.5 % — ABNORMAL HIGH (ref 4.6–6.5)

## 2018-12-09 NOTE — Progress Notes (Addendum)
[facility] at [facility] [address] [place], [place] [address] [phone]  Date:  12/09/2018   Name:  Wanda [name]   DOB:  10-03-1940   MRN:  [id number]  PCP:  Darreld Mclean, MD    Chief Complaint: Fatigue (Fatigue and weakness x3 months. Pt sleeping well during the night. unable to naps. Unable to do things she enjoys. Pt has BP log. )   History of Present Illness:  Wanda [name] is a 78 y.o. very pleasant female patient who presents with the following:  Wanda [name] is seen today with concern of feeling very tired I saw her in the office on November 18 to discuss her blood pressure medications-time she was taking hydrochlorothiazide and clonidine, and asked had her start back on amlodipine 2.5 to bring down her blood pressure  She had a carotid endarterectomy about 4 weeks ago  She contacted me yesterday with the following my chart message: The past 10 days or so I have been very weak and can barely walk. When I first get up I am usually feeling great. After breakfast and when I take my pills (clopidogrel,  glipizide er,  amlodipine,  hydrochloro,  and clonidine) I start  getting weak and a little dizzy. Today, Sunday,  my legs hurt so much that I can barely walk. I think it might be the clopidogrel. What do you think? It has been getting worse each day and I am worried.  We did start her on clonidine on 11/6-this might be her culprit.   However pt feels like her sx have been present for about 3 months-so predating starting clonidine.  May have started around the time that she had her stroke in September.  She also wonders if Plavix may be the cause, but I explained this is unlikely  No CP or SOB She just feels really tired when she is trying to be physically active She does not feel sleepy- does not feel like she needs a nap  She does sleep well She fees well first thing in the am, then she starts to feel poorly and  tired as the day goes on She needs to see neurology for her follow-up visit - this got canceled due to her neck surgery.  I wonder if her fatigue may simply be due to her stroke.  I will help her get set up for neurology follow-up Patient Active Problem List   Diagnosis Date Noted  . Carotid stenosis 11/13/2018  . CVA (cerebral vascular accident) ([facility]) 09/19/2018  . Brainstem infarct, acute ([facility])   . Primary osteoarthritis of left knee 10/04/2015  . Osteoporosis 08/23/2015  . Multinodular goiter (nontoxic) 04/07/2014  . Diabetes mellitus type 2, uncontrolled, without complications 36/64/4034  . Insulin adverse reaction 09/27/2013  . Senile nuclear sclerosis 08/14/2012  . Essential hypertension 03/10/2011  . Hyperlipidemia with target LDL less than 70 03/10/2011  . Obesity (BMI 30.0-34.9) 03/10/2011    Past Medical History:  Diagnosis Date  . Arthritis   . Carotid stenosis 11/2018  . Cataract    surgery to remove  . Diabetes mellitus    type 2  . Hyperlipidemia   . Hypertension   . Skin cancer    Removed from face  . Stroke ([place]) 09/19/2018  . Urgency of urination   . Urinary leakage     Past Surgical History:  Procedure Laterality Date  . ABDOMINAL HYSTERECTOMY  early 80's  total  . CAROTID ENDARTERECTOMY Left 11/13/2018  . CARPAL TUNNEL RELEASE Bilateral 1980 and 1981   both hands  . ENDARTERECTOMY Left 11/13/2018   Procedure: Left Carotid Artery Endarterectomy with Patch Angioplasty;  Surgeon: Rosetta Posner, MD;  Location: [facility];  Service: Vascular;  Laterality: Left;  . EYE SURGERY     bilateral cataracts  . Fatty Tumor Excision    . JOINT REPLACEMENT    . TONSILLECTOMY    . TONSILLECTOMY AND ADENOIDECTOMY  age 66  . TOTAL KNEE ARTHROPLASTY Right 10/04/2015  . TOTAL KNEE ARTHROPLASTY Right 10/04/2015   Procedure: TOTAL KNEE ARTHROPLASTY;  Surgeon: Dorna Leitz, MD;  Location: [facility];  Service: Orthopedics;  Laterality: Right;  . TOTAL KNEE ARTHROPLASTY Left  01/28/2016   Procedure: TOTAL KNEE ARTHROPLASTY;  Surgeon: Dorna Leitz, MD;  Location: [facility];  Service: Orthopedics;  Laterality: Left;  . TUBAL LIGATION      Social History   Tobacco Use  . Smoking status: Never Smoker  . Smokeless tobacco: Never Used  Substance Use Topics  . Alcohol use: Yes    Alcohol/week: 1.0 standard drinks    Types: 1 Standard drinks or equivalent per week    Comment: maybe once per month - 3 drinks  . Drug use: No    Family History  Problem Relation Age of Onset  . Pancreatitis Father        deceased 23  . Cancer Other   . Cancer Brother        GI  . Cancer Mother        liver  . Cancer Son        terminal kidney    Allergies  Allergen Reactions  . Ace Inhibitors Diarrhea, Swelling, Other (See Comments) and Cough    Pt had cough and diarrhea with first few doses of medication; also had swelling of right eyelid. Stopped medication on 03/17/11.  . Alendronate Sodium Other (See Comments)    "Made my whole body hurt"  . Aspirin Hives  . Crestor [Rosuvastatin] Other (See Comments)    "Made my whole body hurt"  . Lipitor [Atorvastatin Calcium] Other (See Comments)    "Made my whole body hurt"  . Metformin And Related     Severe abdominal pain  . Shellfish Allergy Nausea Only    Intolerant of fresh shellfish, reports the reaction is GI upset, denies hives, denies any swelling  Reports that she can tolerate canned seafood.     Medication list has been reviewed and updated.  Current Outpatient Medications on File Prior to Visit  Medication Sig Dispense Refill  . amLODipine (NORVASC) 2.5 MG tablet Take 1 tablet (2.5 mg total) by mouth daily. 90 tablet 3  . blood glucose meter kit and supplies Dispense based on patient and insurance preference. Pt just needs meter 1 each 0  . Blood Glucose Monitoring Suppl (ONE TOUCH ULTRA MINI) w/Device KIT Use to test blood sugar daily as instructed. Dx: E11.65 1 each 0  . cloNIDine (CATAPRES) 0.1 MG tablet Take 1  tablet (0.1 mg total) by mouth 2 (two) times daily. (Patient taking differently: Take 0.1 mg by mouth 2 (two) times daily. ONE WHOLE TABLET BID) 60 tablet 3  . clopidogrel (PLAVIX) 75 MG tablet Take 1 tablet (75 mg total) by mouth daily. 90 tablet 1  . glipiZIDE (GLUCOTROL XL) 5 MG 24 hr tablet Take 10 mg am and 5 mg pm for diabetes (Patient taking differently: Take 10 mg by mouth  daily with breakfast. ) 90 tablet 6  . glucose blood (ONE TOUCH ULTRA TEST) test strip Test blood sugar 3 times a day. Dx code: 250.00 100 each 12  . hydrochlorothiazide (HYDRODIURIL) 25 MG tablet Take 1 tablet (25 mg total) by mouth daily. 90 tablet 3  . Insulin Glargine (LANTUS SOLOSTAR) 100 UNIT/ML Solostar Pen Inject 15-20 Units into the skin daily. (Patient taking differently: Inject 10-20 Units into the skin See admin instructions. Inject 10-20 units into the skin at bedtime, per sliding scale) 5 pen 11  . Insulin Pen Needle (BD PEN NEEDLE NANO U/F) 32G X 4 MM MISC USE TO INJECT INSULIN ONCE DAILY 100 each 6  . Insulin Syringe-Needle U-100 (INSULIN SYRINGE .5CC/31GX5/16") 31G X 5/16" 0.5 ML MISC Use to inject insulin 1 time daily. 90 each 3  . niacin (NIASPAN) 500 MG CR tablet Take 1 tablet (500 mg total) by mouth at bedtime. Increase to 2 tablets after 1 month (Patient taking differently: Take 500 mg by mouth daily. ) 180 tablet 2  . oxyCODONE (OXY IR/ROXICODONE) 5 MG immediate release tablet Take 1 tablet (5 mg total) by mouth every 6 (six) hours as needed for moderate pain. 15 tablet 0  . vitamin E 200 UNIT capsule Take 200 Units by mouth daily.     . [DISCONTINUED] lisinopril (PRINIVIL,ZESTRIL) 20 MG tablet Take 20 mg by mouth daily.      No current facility-administered medications on file prior to visit.     Review of Systems:  As per HPI- otherwise negative.   Physical Examination: Vitals:   12/09/18 1428  BP: (!) 148/72  Pulse: 67  Resp: 18  Temp: 98.2 F (36.8 C)  SpO2: 98%   Vitals:    12/09/18 1428  Weight: 167 lb (75.8 kg)  Height: 5' 1"  (1.549 m)   Body mass index is 31.55 kg/m. Ideal Body Weight: Weight in (lb) to have BMI = 25: 132  GEN: WDWN, NAD, Non-toxic, A & O x 3, overweight, looks well HEENT: Atraumatic, Normocephalic. Neck supple. No masses, No LAD. Ears and Nose: No external deformity. CV: RRR, No M/G/R. No JVD. No thrill. No extra heart sounds. PULM: CTA B, no wheezes, crackles, rhonchi. No retractions. No resp. distress. No accessory muscle use. ABD: S, NT, ND, +BS. No rebound. No HSM. EXTR: No c/c/e NEURO Normal gait.  PSYCH: Normally interactive. Conversant. Not depressed or anxious appearing.  Calm demeanor.    Assessment and Plan: Essential hypertension  Cerebrovascular accident (CVA) due to thrombosis of basilar artery ([place]) - Plan: CBC  Controlled diabetes mellitus type 2 with complications, unspecified whether long term insulin use ([place]) - Plan: Hemoglobin A1c  Urinary frequency - Plan: Urine culture  Muscle pain - Plan: CK  Patient here today with vague feeling of fatigue and lack of energy.  She has no chest pain or shortness of breath.  She did suffer a stroke in September, she is also taking clonidine-both of these are potential culprits Labs pending as above-rule out UTI Check CK A1c to follow-up on diabetes I contacted her neurology office to arrange a follow-up visit after stroke Will plan further follow- up pending labs.  This visit occurred during the SARS-CoV-2 public health emergency.  Safety protocols were in place, including screening questions prior to the visit, additional usage of staff PPE, and extensive cleaning of exam room while observing appropriate contact time as indicated for disinfecting solutions.    Signed Lamar Blinks, MD  Received her labs as  below-message to patient  Hemoglobin is trending up after endarterectomy She is taking glipizide 10 mg a.m. and Lantus for diabetes A1c is slightly too  high Results for orders placed or performed in visit on 12/09/18  Urine culture   Specimen: Blood  Result Value Ref Range   MICRO NUMBER: [id number]    SPECIMEN QUALITY: Adequate    Sample Source NOT GIVEN    STATUS: FINAL    Result: No Growth   CBC  Result Value Ref Range   WBC 5.1 4.0 - 10.5 K/uL   RBC 3.88 3.87 - 5.11 Mil/uL   Platelets 247.0 150.0 - 400.0 K/uL   Hemoglobin 11.8 (L) 12.0 - 15.0 g/dL   HCT 34.9 (L) 36.0 - 46.0 %   MCV 90.0 78.0 - 100.0 fl   MCHC 33.9 30.0 - 36.0 g/dL   RDW 13.8 11.5 - 15.5 %  CK  Result Value Ref Range   Total CK 111 7 - 177 U/L  Hemoglobin A1c  Result Value Ref Range   Hgb A1c MFr Bld 8.5 (H) 4.6 - 6.5 %    Received her urine culture, 12/2-message to patient

## 2018-12-11 ENCOUNTER — Encounter: Payer: Self-pay | Admitting: Family Medicine

## 2018-12-11 LAB — URINE CULTURE
MICRO NUMBER:: 1145971
Result:: NO GROWTH
SPECIMEN QUALITY:: ADEQUATE

## 2018-12-17 ENCOUNTER — Telehealth: Payer: Self-pay

## 2018-12-17 NOTE — Telephone Encounter (Signed)
Pt called back and she accepted NP Jessica's 1st available afternoon slot, pt is also on wait list if another afternoon slot is available before.

## 2018-12-17 NOTE — Telephone Encounter (Signed)
Received a Forwarded Message on behalf of pt via staff message:   "From: Darreld Mclean, MD  Sent: 12/09/2018  5:00 PM EST  To: Thomes Cake, Ronan,  I believe this patient was supposed to be seen for follow-up after her stroke in September. This got canceled or delayed for various reasons. Could your office please give her a call and schedule her for stroke follow-up? Thank you so much   JC"   Called pt to try to get he on the schedule. NALVM.  If PT calls please schedule for NP [facility].

## 2018-12-25 ENCOUNTER — Other Ambulatory Visit: Payer: Self-pay

## 2018-12-25 DIAGNOSIS — I6523 Occlusion and stenosis of bilateral carotid arteries: Secondary | ICD-10-CM

## 2018-12-27 ENCOUNTER — Other Ambulatory Visit: Payer: Self-pay

## 2018-12-28 NOTE — Progress Notes (Deleted)
[place] [facility] [address] [place], [place] [address] [phone]  Date:  12/30/2018   Name:  Wanda [name]   DOB:  1940-03-08   MRN:  [id number]  PCP:  Darreld Mclean, MD    Chief Complaint: No chief complaint on file.   History of Present Illness:  Wanda [name] is a 78 y.o. very pleasant female patient who presents with the following:  Here today for a periodic follow-up visit History of DM, HTN, hyperlipidemia, CVA, carotid stenosis  Last seen by myself at the end of November with complaint of feeling very tired  We thought this might be due to CVA in September  Lab Results  Component Value Date   HGBA1C 8.5 (H) 12/09/2018     Patient Active Problem List   Diagnosis Date Noted  . Carotid stenosis 11/13/2018  . CVA (cerebral vascular accident) ([place]) 09/19/2018  . Brainstem infarct, acute ([place])   . Primary osteoarthritis of left knee 10/04/2015  . Osteoporosis 08/23/2015  . Multinodular goiter (nontoxic) 04/07/2014  . Diabetes mellitus type 2, uncontrolled, without complications 87/56/4332  . Insulin adverse reaction 09/27/2013  . Senile nuclear sclerosis 08/14/2012  . Essential hypertension 03/10/2011  . Hyperlipidemia with target LDL less than 70 03/10/2011  . Obesity (BMI 30.0-34.9) 03/10/2011    Past Medical History:  Diagnosis Date  . Arthritis   . Carotid stenosis 11/2018  . Cataract    surgery to remove  . Diabetes mellitus    type 2  . Hyperlipidemia   . Hypertension   . Skin cancer    Removed from face  . Stroke ([place]) 09/19/2018  . Urgency of urination   . Urinary leakage     Past Surgical History:  Procedure Laterality Date  . ABDOMINAL HYSTERECTOMY  early 80's   total  . CAROTID ENDARTERECTOMY Left 11/13/2018  . CARPAL TUNNEL RELEASE Bilateral 1980 and 1981   both hands  . ENDARTERECTOMY Left 11/13/2018   Procedure: Left Carotid Artery Endarterectomy with  Patch Angioplasty;  Surgeon: Rosetta Posner, MD;  Location: [facility];  Service: Vascular;  Laterality: Left;  . EYE SURGERY     bilateral cataracts  . Fatty Tumor Excision    . JOINT REPLACEMENT    . TONSILLECTOMY    . TONSILLECTOMY AND ADENOIDECTOMY  age 24  . TOTAL KNEE ARTHROPLASTY Right 10/04/2015  . TOTAL KNEE ARTHROPLASTY Right 10/04/2015   Procedure: TOTAL KNEE ARTHROPLASTY;  Surgeon: Dorna Leitz, MD;  Location: [facility];  Service: Orthopedics;  Laterality: Right;  . TOTAL KNEE ARTHROPLASTY Left 01/28/2016   Procedure: TOTAL KNEE ARTHROPLASTY;  Surgeon: Dorna Leitz, MD;  Location: [facility];  Service: Orthopedics;  Laterality: Left;  . TUBAL LIGATION      Social History   Tobacco Use  . Smoking status: Never Smoker  . Smokeless tobacco: Never Used  Substance Use Topics  . Alcohol use: Yes    Alcohol/week: 1.0 standard drinks    Types: 1 Standard drinks or equivalent per week    Comment: maybe once per month - 3 drinks  . Drug use: No    Family History  Problem Relation Age of Onset  . Pancreatitis Father        deceased 6  . Cancer Other   . Cancer Brother        GI  . Cancer Mother        liver  . Cancer Son  terminal kidney    Allergies  Allergen Reactions  . Ace Inhibitors Diarrhea, Swelling, Other (See Comments) and Cough    Pt had cough and diarrhea with first few doses of medication; also had swelling of right eyelid. Stopped medication on 03/17/11.  . Alendronate Sodium Other (See Comments)    "Made my whole body hurt"  . Aspirin Hives  . Crestor [Rosuvastatin] Other (See Comments)    "Made my whole body hurt"  . Lipitor [Atorvastatin Calcium] Other (See Comments)    "Made my whole body hurt"  . Metformin And Related     Severe abdominal pain  . Shellfish Allergy Nausea Only    Intolerant of fresh shellfish, reports the reaction is GI upset, denies hives, denies any swelling  Reports that she can tolerate canned seafood.     Medication list has been  reviewed and updated.  Current Outpatient Medications on File Prior to Visit  Medication Sig Dispense Refill  . amLODipine (NORVASC) 2.5 MG tablet Take 1 tablet (2.5 mg total) by mouth daily. 90 tablet 3  . blood glucose meter kit and supplies Dispense based on patient and insurance preference. Pt just needs meter 1 each 0  . Blood Glucose Monitoring Suppl (ONE TOUCH ULTRA MINI) w/Device KIT Use to test blood sugar daily as instructed. Dx: E11.65 1 each 0  . cloNIDine (CATAPRES) 0.1 MG tablet Take 1 tablet (0.1 mg total) by mouth 2 (two) times daily. (Patient taking differently: Take 0.1 mg by mouth 2 (two) times daily. ONE WHOLE TABLET BID) 60 tablet 3  . clopidogrel (PLAVIX) 75 MG tablet Take 1 tablet (75 mg total) by mouth daily. 90 tablet 1  . glipiZIDE (GLUCOTROL XL) 5 MG 24 hr tablet Take 10 mg am and 5 mg pm for diabetes (Patient taking differently: Take 10 mg by mouth daily with breakfast. ) 90 tablet 6  . glucose blood (ONE TOUCH ULTRA TEST) test strip Test blood sugar 3 times a day. Dx code: 250.00 100 each 12  . hydrochlorothiazide (HYDRODIURIL) 25 MG tablet Take 1 tablet (25 mg total) by mouth daily. 90 tablet 3  . Insulin Glargine (LANTUS SOLOSTAR) 100 UNIT/ML Solostar Pen Inject 15-20 Units into the skin daily. (Patient taking differently: Inject 10-20 Units into the skin See admin instructions. Inject 10-20 units into the skin at bedtime, per sliding scale) 5 pen 11  . Insulin Pen Needle (BD PEN NEEDLE NANO U/F) 32G X 4 MM MISC USE TO INJECT INSULIN ONCE DAILY 100 each 6  . Insulin Syringe-Needle U-100 (INSULIN SYRINGE .5CC/31GX5/16") 31G X 5/16" 0.5 ML MISC Use to inject insulin 1 time daily. 90 each 3  . niacin (NIASPAN) 500 MG CR tablet Take 1 tablet (500 mg total) by mouth at bedtime. Increase to 2 tablets after 1 month (Patient taking differently: Take 500 mg by mouth daily. ) 180 tablet 2  . oxyCODONE (OXY IR/ROXICODONE) 5 MG immediate release tablet Take 1 tablet (5 mg total) by  mouth every 6 (six) hours as needed for moderate pain. 15 tablet 0  . vitamin E 200 UNIT capsule Take 200 Units by mouth daily.     . [DISCONTINUED] lisinopril (PRINIVIL,ZESTRIL) 20 MG tablet Take 20 mg by mouth daily.      No current facility-administered medications on file prior to visit.    Review of Systems:  As per HPI- otherwise negative.   Physical Examination: There were no vitals filed for this visit. There were no vitals filed for this visit.  There is no height or weight on file to calculate BMI. Ideal Body Weight:    GEN: WDWN, NAD, Non-toxic, A & O x 3 HEENT: Atraumatic, Normocephalic. Neck supple. No masses, No LAD. Ears and Nose: No external deformity. CV: RRR, No M/G/R. No JVD. No thrill. No extra heart sounds. PULM: CTA B, no wheezes, crackles, rhonchi. No retractions. No resp. distress. No accessory muscle use. ABD: S, NT, ND, +BS. No rebound. No HSM. EXTR: No c/c/e NEURO Normal gait.  PSYCH: Normally interactive. Conversant. Not depressed or anxious appearing.  Calm demeanor.    Assessment and Plan: ***  Signed Lamar Blinks, MD

## 2018-12-30 ENCOUNTER — Ambulatory Visit: Payer: Medicare Other | Admitting: Family Medicine

## 2019-02-03 ENCOUNTER — Other Ambulatory Visit: Payer: Self-pay

## 2019-02-03 DIAGNOSIS — M25561 Pain in right knee: Secondary | ICD-10-CM | POA: Diagnosis not present

## 2019-02-03 DIAGNOSIS — M25562 Pain in left knee: Secondary | ICD-10-CM | POA: Diagnosis not present

## 2019-02-03 DIAGNOSIS — Z96651 Presence of right artificial knee joint: Secondary | ICD-10-CM | POA: Diagnosis not present

## 2019-02-03 DIAGNOSIS — Z96652 Presence of left artificial knee joint: Secondary | ICD-10-CM | POA: Diagnosis not present

## 2019-02-03 NOTE — Progress Notes (Addendum)
[facility] at [facility] [place] [address], [address] [place], [place] [address] [phone] Fax [phone]  Date:  02/05/2019   Name:  Wanda [name]   DOB:  11/26/1940   MRN:  [id number]  PCP:  ,  C, MD    Chief Complaint: Hypertension (164/87 210/94 )   History of Present Illness:  Wanda [name] is a 79 y.o. very pleasant female patient who presents with the following:  Here today for follow-up visit, and discussed concern of high blood pressure History of CVA, carotid stenosis, diabetes, hypertension, hyperlipidemia She has been evaluated by vascular surgery, status post left endarterectomy in October for carotid stenosis  She had not checked he BP for a week or so-then 3 days ago her BP was 210/94 at home, elevated again this am to 164/87 Her home machine has not been checked recently  No headache or other symptoms noted Her BP is ok here today  Last seen by myself at the end of November-at that time we increased her dose of glipizide to 10 AM and 5 PM due to elevated A1c At that time she noted vague fatigue and lack of energy.  She had not yet been to see neurology following her stroke, I arranged for follow-up but I do not believe she has yet been seen- she has her first appt on 02/19/2019  She noted onset of right knee pain about a week ago- Dr Graves put her in a knee brace and reassured her  Foot exam today  Lab Results  Component Value Date   HGBA1C 8.5 (H) 12/09/2018   BP Readings from Last 3 Encounters:  02/05/19 (!) 142/78  12/09/18 (!) 148/72  11/27/18 (!) 152/70   She declines a flu vaccine due   Patient Active Problem List   Diagnosis Date Noted  . Carotid stenosis 11/13/2018  . CVA (cerebral vascular accident) ([facility]) 09/19/2018  . Brainstem infarct, acute ([facility])   . Primary osteoarthritis of left knee 10/04/2015  . Osteoporosis 08/23/2015  . Multinodular goiter (nontoxic) 04/07/2014  . Diabetes mellitus  type 2, uncontrolled, without complications 10/09/2013  . Insulin adverse reaction 09/27/2013  . Senile nuclear sclerosis 08/14/2012  . Essential hypertension 03/10/2011  . Hyperlipidemia with target LDL less than 70 03/10/2011  . Obesity (BMI 30.0-34.9) 03/10/2011    Past Medical History:  Diagnosis Date  . Arthritis   . Carotid stenosis 11/2018  . Cataract    surgery to remove  . Diabetes mellitus    type 2  . Hyperlipidemia   . Hypertension   . Skin cancer    Removed from face  . Stroke ([facility]) 09/19/2018  . Urgency of urination   . Urinary leakage     Past Surgical History:  Procedure Laterality Date  . ABDOMINAL HYSTERECTOMY  early 80's   total  . CAROTID ENDARTERECTOMY Left 11/13/2018  . CARPAL TUNNEL RELEASE Bilateral 1980 and 1981   both hands  . ENDARTERECTOMY Left 11/13/2018   Procedure: Left Carotid Artery Endarterectomy with Patch Angioplasty;  Surgeon: Early, Todd F, MD;  [facility];  Service: Vascular;  Laterality: Left;  . EYE SURGERY     bilateral cataracts  . Fatty Tumor Excision    . JOINT REPLACEMENT    . TONSILLECTOMY    . TONSILLECTOMY AND ADENOIDECTOMY  age 5  . TOTAL KNEE ARTHROPLASTY Right 10/04/2015  . TOTAL KNEE ARTHROPLASTY Right 10/04/2015   Procedure: TOTAL KNEE ARTHROPLASTY;  Surgeon: John Graves,   MD;  Location: [facility];  Service: Orthopedics;  Laterality: Right;  . TOTAL KNEE ARTHROPLASTY Left 01/28/2016   Procedure: TOTAL KNEE ARTHROPLASTY;  Surgeon: John Graves, MD;  Location[facility];  Service: Orthopedics;  Laterality: Left;  . TUBAL LIGATION      Social History   Tobacco Use  . Smoking status: Never Smoker  . Smokeless tobacco: Never Used  Substance Use Topics  . Alcohol use: Yes    Alcohol/week: 1.0 standard drinks    Types: 1 Standard drinks or equivalent per week    Comment: maybe once per month - 3 drinks  . Drug use: No    Family History  Problem Relation Age of Onset  . Pancreatitis Father        deceased 32  .  Cancer Other   . Cancer Brother        GI  . Cancer Mother        liver  . Cancer Son        terminal kidney    Allergies  Allergen Reactions  . Ace Inhibitors Diarrhea, Swelling, Other (See Comments) and Cough    Pt had cough and diarrhea with first few doses of medication; also had swelling of right eyelid. Stopped medication on 03/17/11.  . Alendronate Sodium Other (See Comments)    "Made my whole body hurt"  . Aspirin Hives  . Crestor [Rosuvastatin] Other (See Comments)    "Made my whole body hurt"  . Lipitor [Atorvastatin Calcium] Other (See Comments)    "Made my whole body hurt"  . Metformin And Related     Severe abdominal pain  . Shellfish Allergy Nausea Only    Intolerant of fresh shellfish, reports the reaction is GI upset, denies hives, denies any swelling  Reports that she can tolerate canned seafood.     Medication list has been reviewed and updated.  Current Outpatient Medications on File Prior to Visit  Medication Sig Dispense Refill  . amLODipine (NORVASC) 2.5 MG tablet Take 1 tablet (2.5 mg total) by mouth daily. 90 tablet 3  . blood glucose meter kit and supplies Dispense based on patient and insurance preference. Pt just needs meter 1 each 0  . Blood Glucose Monitoring Suppl (ONE TOUCH ULTRA MINI) w/Device KIT Use to test blood sugar daily as instructed. Dx: E11.65 1 each 0  . cloNIDine (CATAPRES) 0.1 MG tablet Take 1 tablet (0.1 mg total) by mouth 2 (two) times daily. (Patient taking differently: Take 0.1 mg by mouth 2 (two) times daily. ONE WHOLE TABLET BID) 60 tablet 3  . clopidogrel (PLAVIX) 75 MG tablet Take 1 tablet (75 mg total) by mouth daily. 90 tablet 1  . glipiZIDE (GLUCOTROL XL) 5 MG 24 hr tablet Take 10 mg am and 5 mg pm for diabetes (Patient taking differently: Take 10 mg by mouth daily with breakfast. ) 90 tablet 6  . glucose blood (ONE TOUCH ULTRA TEST) test strip Test blood sugar 3 times a day. Dx code: 250.00 100 each 12  . hydrochlorothiazide  (HYDRODIURIL) 25 MG tablet Take 1 tablet (25 mg total) by mouth daily. 90 tablet 3  . Insulin Glargine (LANTUS SOLOSTAR) 100 UNIT/ML Solostar Pen Inject 15-20 Units into the skin daily. (Patient taking differently: Inject 10-20 Units into the skin See admin instructions. Inject 10-20 units into the skin at bedtime, per sliding scale) 5 pen 11  . Insulin Pen Needle (BD PEN NEEDLE NANO U/F) 32G X 4 MM MISC USE TO INJECT INSULIN   ONCE DAILY 100 each 6  . Insulin Syringe-Needle U-100 (INSULIN SYRINGE .5CC/31GX5/16") 31G X 5/16" 0.5 ML MISC Use to inject insulin 1 time daily. 90 each 3  . niacin (NIASPAN) 500 MG CR tablet Take 1 tablet (500 mg total) by mouth at bedtime. Increase to 2 tablets after 1 month (Patient taking differently: Take 500 mg by mouth daily. ) 180 tablet 2  . oxyCODONE (OXY IR/ROXICODONE) 5 MG immediate release tablet Take 1 tablet (5 mg total) by mouth every 6 (six) hours as needed for moderate pain. 15 tablet 0  . vitamin E 200 UNIT capsule Take 200 Units by mouth daily.     . [DISCONTINUED] lisinopril (PRINIVIL,ZESTRIL) 20 MG tablet Take 20 mg by mouth daily.      No current facility-administered medications on file prior to visit.    Review of Systems:  As per HPI- otherwise negative. No fever or chills No CP or SOB   Physical Examination: Vitals:   02/05/19 1026  BP: (!) 142/78  Pulse: 80  Resp: 16  Temp: (!) 97.3 F (36.3 C)  SpO2: 98%   Vitals:   02/05/19 1026  Weight: 179 lb (81.2 kg)  Height: 5' 1" (1.549 m)   Body mass index is 33.82 kg/m. Ideal Body Weight: Weight in (lb) to have BMI = 25: 132  GEN: WDWN, NAD, Non-toxic, A & O x 3, overweight, looks well  HEENT: Atraumatic, Normocephalic. Neck supple. No masses, No LAD. Ears and Nose: No external deformity. CV: RRR, No M/G/R. No JVD. No thrill. No extra heart sounds. PULM: CTA B, no wheezes, crackles, rhonchi. No retractions. No resp. distress. No accessory muscle use. ABD: S, NT, ND EXTR: No  c/c/e NEURO Normal gait.  PSYCH: Normally interactive. Conversant. Not depressed or anxious appearing.  Calm demeanor.  Foot exam normal today   Assessment and Plan: Cerebrovascular accident (CVA) due to thrombosis of basilar artery ([place])  Controlled diabetes mellitus type 2 with complications, unspecified whether long term insulin use ([place]) - Plan: Hemoglobin A1c  Essential hypertension - Plan: amLODipine (NORVASC) 5 MG tablet  Elevated BP- will increase her amlodipine to 5 mg We are not sure how accurate her home cuff is- she plans to bring it with her to upcoming neurology appt to make sure it is working ok She is seeing neuro in abut 10 days for stroke follow-up Check on DM status today with A1c- recent med dose adjustment  This visit occurred during the SARS-CoV-2 public health emergency.  Safety protocols were in place, including screening questions prior to the visit, additional usage of staff PPE, and extensive cleaning of exam room while observing appropriate contact time as indicated for disinfecting solutions.     Signed Lamar Blinks, MD  Received her labs as below, message to patient  Results for orders placed or performed in visit on 02/05/19  Hemoglobin A1c  Result Value Ref Range   Hgb A1c MFr Bld 9.0 (H) 4.6 - 6.5 %   Unfortunately A1c has not improved Taking glipizide, Lantus

## 2019-02-04 ENCOUNTER — Other Ambulatory Visit: Payer: Self-pay

## 2019-02-05 ENCOUNTER — Ambulatory Visit (INDEPENDENT_AMBULATORY_CARE_PROVIDER_SITE_OTHER): Payer: Medicare Other | Admitting: Family Medicine

## 2019-02-05 ENCOUNTER — Encounter: Payer: Self-pay | Admitting: Family Medicine

## 2019-02-05 ENCOUNTER — Other Ambulatory Visit: Payer: Self-pay

## 2019-02-05 VITALS — BP 142/78 | HR 80 | Temp 97.3°F | Resp 16 | Ht 61.0 in | Wt 179.0 lb

## 2019-02-05 DIAGNOSIS — I6302 Cerebral infarction due to thrombosis of basilar artery: Secondary | ICD-10-CM

## 2019-02-05 DIAGNOSIS — E118 Type 2 diabetes mellitus with unspecified complications: Secondary | ICD-10-CM

## 2019-02-05 DIAGNOSIS — I10 Essential (primary) hypertension: Secondary | ICD-10-CM

## 2019-02-05 LAB — HEMOGLOBIN A1C: Hgb A1c MFr Bld: 9 % — ABNORMAL HIGH (ref 4.6–6.5)

## 2019-02-05 MED ORDER — AMLODIPINE BESYLATE 5 MG PO TABS: 5.0000 mg | ORAL_TABLET | Freq: Every day | ORAL | 3 refills | Status: DC

## 2019-02-05 NOTE — Patient Instructions (Signed)
Good to see you again today!  I will be in touch with your labs asap  Let's increase your amlodipine to 5 mg a day - let me know how you seem to respond to this change,  I am glad to check your home BP machine at your convenience   If all is well, please see me in 4-6 months

## 2019-02-14 ENCOUNTER — Encounter: Payer: Self-pay | Admitting: Family Medicine

## 2019-02-19 ENCOUNTER — Other Ambulatory Visit: Payer: Self-pay

## 2019-02-19 ENCOUNTER — Encounter: Payer: Self-pay | Admitting: Adult Health

## 2019-02-19 ENCOUNTER — Ambulatory Visit (INDEPENDENT_AMBULATORY_CARE_PROVIDER_SITE_OTHER): Payer: Medicare Other | Admitting: Adult Health

## 2019-02-19 VITALS — BP 136/82 | HR 99 | Temp 97.8°F | Ht 61.0 in | Wt 180.2 lb

## 2019-02-19 DIAGNOSIS — I639 Cerebral infarction, unspecified: Secondary | ICD-10-CM | POA: Diagnosis not present

## 2019-02-19 DIAGNOSIS — E785 Hyperlipidemia, unspecified: Secondary | ICD-10-CM

## 2019-02-19 DIAGNOSIS — I10 Essential (primary) hypertension: Secondary | ICD-10-CM | POA: Diagnosis not present

## 2019-02-19 DIAGNOSIS — E119 Type 2 diabetes mellitus without complications: Secondary | ICD-10-CM

## 2019-02-19 DIAGNOSIS — Z794 Long term (current) use of insulin: Secondary | ICD-10-CM | POA: Diagnosis not present

## 2019-02-19 DIAGNOSIS — I6523 Occlusion and stenosis of bilateral carotid arteries: Secondary | ICD-10-CM

## 2019-02-19 MED ORDER — EZETIMIBE 10 MG PO TABS: 10.0000 mg | ORAL_TABLET | Freq: Every day | ORAL | 3 refills | Status: DC

## 2019-02-19 NOTE — Progress Notes (Signed)
[facility] [address] [place]. [place] [address] [phone]       HOSPITAL FOLLOW UP NOTE  Wanda [name] Date of Birth:  February 19, 1940 Medical Record Number:  [id number]   Reason for Referral:  hospital stroke follow up    CHIEF COMPLAINT:  Chief Complaint  Patient presents with  . Follow-up    Alone. Rm 9. No new concerns at this time.     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  History obtained from patient and chart review. Reviewed all radiology images and labs personally.  Wanda [name] is a 79 y.o. [name] with history of DB and HTN  who presented on 09/19/2018 with not feeling well, difficulty swallowing.  Evaluated by stroke team and Dr. Erlinda Hong with stroke work-up revealing left lateral medullary infarct as evidenced on MRI secondary to small or large vessel disease source.  CTA showed bilateral ICA stenosis right 75% and left 70% but they will asymptomatic and recommended follow-up with [facility] outpatient for ongoing monitoring and management.  Initiated Plavix for secondary stroke prevention with history of aspirin.  HTN stable and recommended long-term BP goal 1 30-1 50 given bilateral ICA stenosis.  LDL 158 initiated Crestor 20 mg daily.  History of DM with A1c 8.6 with concerns of noncompliance.  No prior history of stroke.  Discharged home in stable condition on 09/23/2018.  Wanda [name] is a 79 year old [name] who is being seen today for hospital follow-up.  Residual deficits of right hemisensory impairment which has been stable. Denies pain, weakness, numbness or tingling.  She returned back to all prior activities without difficulty. she was evaluated by Dr. Donnetta Hutching and felt recent stroke likely due to large vessel disease therefore underwent left carotid endarterectomy on 33/08/2503 without complication.  She was advised to  follow-up 9 months post procedure for repeat carotid duplex.  Continues on Plavix for secondary stroke.  Discontinued Crestor due to possible statin myalgias.  Recent A1c 9.0.  Continues to follow with PCP for HTN, HLD and DM management.  Denies new or worsening stroke/TIA symptoms.    ROS:   14 system review of systems performed and negative with exception of decreased sensation  PMH:  Past Medical History:  Diagnosis Date  . Arthritis   . Carotid stenosis 11/2018  . Cataract    surgery to remove  . Diabetes mellitus    type 2  . Hyperlipidemia   . Hypertension   . Skin cancer    Removed from face  . Stroke ([place]) 09/19/2018  . Urgency of urination   . Urinary leakage     PSH:  Past Surgical History:  Procedure Laterality Date  . ABDOMINAL HYSTERECTOMY  early 80's   total  . CAROTID ENDARTERECTOMY Left 11/13/2018  . CARPAL TUNNEL RELEASE Bilateral 1980 and 1981   both hands  . ENDARTERECTOMY Left 11/13/2018   Procedure: Left Carotid Artery Endarterectomy with Patch Angioplasty;  Surgeon: Rosetta Posner, MD;  Location: [facility];  Service: Vascular;  Laterality: Left;  . EYE SURGERY     bilateral cataracts  . Fatty Tumor Excision    . JOINT REPLACEMENT    . TONSILLECTOMY    . TONSILLECTOMY AND ADENOIDECTOMY  age 81  . TOTAL KNEE ARTHROPLASTY Right 10/04/2015  . TOTAL KNEE ARTHROPLASTY Right 10/04/2015   Procedure: TOTAL KNEE ARTHROPLASTY;  Surgeon: Dorna Leitz, MD;  Location[facility]  OR;  Service: Orthopedics;  Laterality: Right;  . TOTAL KNEE ARTHROPLASTY Left 01/28/2016   Procedure: TOTAL KNEE ARTHROPLASTY;  Surgeon: Dorna Leitz, MD;  Location: [facility];  Service: Orthopedics;  Laterality: Left;  . TUBAL LIGATION      Social History:  Social History   Socioeconomic History  . Marital status: Widowed    Spouse name: Not on file  . Number of children: Not on file  . Years of education: Not on file  . Highest education level: Not on file  Occupational History  . Not on file    Tobacco Use  . Smoking status: Never Smoker  . Smokeless tobacco: Never Used  Substance and Sexual Activity  . Alcohol use: Yes    Alcohol/week: 1.0 standard drinks    Types: 1 Standard drinks or equivalent per week    Comment: maybe once per month - 3 drinks  . Drug use: No  . Sexual activity: Not Currently    Birth control/protection: Post-menopausal    Comment: Hysterectomy  Other Topics Concern  . Not on file  Social History Narrative   Widowed. Education: [employer]. Exercise: 2 times a week for 30 minutes.   Social Determinants of Health   Financial Resource Strain:   . Difficulty of Paying Living Expenses: Not on file  Food Insecurity:   . Worried About Charity fundraiser in the Last Year: Not on file  . Ran Out of Food in the Last Year: Not on file  Transportation Needs:   . Lack of Transportation (Medical): Not on file  . Lack of Transportation (Non-Medical): Not on file  Physical Activity:   . Days of Exercise per Week: Not on file  . Minutes of Exercise per Session: Not on file  Stress:   . Feeling of Stress : Not on file  Social Connections:   . Frequency of Communication with Friends and Family: Not on file  . Frequency of Social Gatherings with Friends and Family: Not on file  . Attends Religious Services: Not on file  . Active Member of Clubs or Organizations: Not on file  . Attends Archivist Meetings: Not on file  . Marital Status: Not on file  Intimate Partner Violence:   . Fear of Current or Ex-Partner: Not on file  . Emotionally Abused: Not on file  . Physically Abused: Not on file  . Sexually Abused: Not on file    Family History:  Family History  Problem Relation Age of Onset  . Pancreatitis Father        deceased 34  . Cancer Other   . Cancer Brother        GI  . Cancer Mother        liver  . Cancer Son        terminal kidney    Medications:   Current Outpatient Medications on File Prior to Visit  Medication Sig Dispense  Refill  . amLODipine (NORVASC) 5 MG tablet Take 1 tablet (5 mg total) by mouth daily. 90 tablet 3  . blood glucose meter kit and supplies Dispense based on patient and insurance preference. Pt just needs meter 1 each 0  . Blood Glucose Monitoring Suppl (ONE TOUCH ULTRA MINI) w/Device KIT Use to test blood sugar daily as instructed. Dx: E11.65 1 each 0  . cloNIDine (CATAPRES) 0.1 MG tablet Take 1 tablet (0.1 mg total) by mouth 2 (two) times daily. (Patient taking differently: Take 0.1 mg by mouth 2 (two) times  daily. ONE WHOLE TABLET BID) 60 tablet 3  . clopidogrel (PLAVIX) 75 MG tablet Take 1 tablet (75 mg total) by mouth daily. 90 tablet 1  . glipiZIDE (GLUCOTROL XL) 5 MG 24 hr tablet Take 10 mg am and 5 mg pm for diabetes (Patient taking differently: Take 10 mg by mouth daily with breakfast. ) 90 tablet 6  . glucose blood (ONE TOUCH ULTRA TEST) test strip Test blood sugar 3 times a day. Dx code: 250.00 100 each 12  . hydrochlorothiazide (HYDRODIURIL) 25 MG tablet Take 1 tablet (25 mg total) by mouth daily. 90 tablet 3  . Insulin Glargine (LANTUS SOLOSTAR) 100 UNIT/ML Solostar Pen Inject 15-20 Units into the skin daily. (Patient taking differently: Inject 10-20 Units into the skin See admin instructions. Inject 10-20 units into the skin at bedtime, per sliding scale) 5 pen 11  . Insulin Pen Needle (BD PEN NEEDLE NANO U/F) 32G X 4 MM MISC USE TO INJECT INSULIN ONCE DAILY 100 each 6  . Insulin Syringe-Needle U-100 (INSULIN SYRINGE .5CC/31GX5/16") 31G X 5/16" 0.5 ML MISC Use to inject insulin 1 time daily. 90 each 3  . niacin (NIASPAN) 500 MG CR tablet Take 1 tablet (500 mg total) by mouth at bedtime. Increase to 2 tablets after 1 month (Patient taking differently: Take 500 mg by mouth daily. ) 180 tablet 2  . vitamin E 200 UNIT capsule Take 200 Units by mouth daily.     . [DISCONTINUED] lisinopril (PRINIVIL,ZESTRIL) 20 MG tablet Take 20 mg by mouth daily.      No current facility-administered  medications on file prior to visit.    Allergies:   Allergies  Allergen Reactions  . Ace Inhibitors Diarrhea, Swelling, Other (See Comments) and Cough    Pt had cough and diarrhea with first few doses of medication; also had swelling of right eyelid. Stopped medication on 03/17/11.  . Alendronate Sodium Other (See Comments)    "Made my whole body hurt"  . Aspirin Hives  . Crestor [Rosuvastatin] Other (See Comments)    "Made my whole body hurt"  . Lipitor [Atorvastatin Calcium] Other (See Comments)    "Made my whole body hurt"  . Metformin And Related     Severe abdominal pain  . Shellfish Allergy Nausea Only    Intolerant of fresh shellfish, reports the reaction is GI upset, denies hives, denies any swelling  Reports that she can tolerate canned seafood.      Physical Exam  Vitals:   02/19/19 1346  BP: 136/82  Pulse: 99  Temp: 97.8 F (36.6 C)  TempSrc: Oral  Weight: 180 lb 3.2 oz (81.7 kg)  Height: 5' 1"  (1.549 m)   Body mass index is 34.05 kg/m. No exam data present  Depression screen [facility] 2/9 03/04/2018  Decreased Interest 0  Down, Depressed, Hopeless 0  PHQ - 2 Score 0     General: well developed, well nourished, pleasant elderly Caucasian [name], seated, in no evident distress Head: head normocephalic and atraumatic.   Neck: supple with no carotid or supraclavicular bruits Cardiovascular: regular rate and rhythm, no murmurs Musculoskeletal: no deformity Skin:  no rash/petichiae Vascular:  Normal pulses all extremities   Neurologic Exam Mental Status: Awake and fully alert. ,  Speech and language.  Oriented to place and time. Recent and remote memory intact. Attention span, concentration and fund of knowledge appropriate. Mood and affect appropriate.  Cranial Nerves: Fundoscopic exam reveals sharp disc margins. Pupils equal, briskly reactive to light. Extraocular movements  full without nystagmus. Visual fields full to confrontation. Hearing intact. Facial  sensation intact. Face, tongue, palate moves normally and symmetrically.  Motor: Normal bulk and tone. Normal strength in all tested extremity muscles. Sensory.: intact to touch , pinprick , position and vibratory sensation with subjective decreased right side pain sensation and temperature sensation compared to left side.  Coordination: Rapid alternating movements normal in all extremities. Finger-to-nose and heel-to-shin performed accurately bilaterally. Gait and Station: Arises from chair without difficulty. Stance is normal. Gait demonstrates normal stride length and balance Reflexes: 1+ and symmetric. Toes downgoing.     NIHSS  0 Modified Rankin  1    Diagnostic Data (Labs, Imaging, Testing)  CT HEAD WO CONTRAST 09/19/2018 IMPRESSION: Evidence of cerebral white matter small vessel disease but no acute cortically based infarct or acute intracranial hemorrhage identified.  CT ANGIO HEAD W OR WO CONTRAST CT ANGIO NECK W OR WO CONTRAST 09/20/2018 IMPRESSION: 1. No emergent finding. No high-grade V4 segment or inferior cerebellar stenosis to correlate with acute medullary infarct. 2. Bilateral cervical carotid atherosclerosis with 75% right and 70% left proximal ICA stenosis. 3. Extensive vertebral atherosclerosis with moderate or advanced narrowing at the origin of the non dominant right vertebral artery. 4. 50% proximal right subclavian stenosis. 5. 8 mm nodular calcification at the level of the deep left Meckel's cave, question meningioma.  MR BRAIN WO CONTRAST 09/19/2018 IMPRESSION: 1. Positive for a small left lateral medullary lacunar infarct. No associated hemorrhage or mass effect. 2. No other acute intracranial abnormality. Cerebral white matter signal changes suggesting chronic small vessel disease.  ECHOCARDIOGRAM 09/20/2018 IMPRESSIONS  1. The left ventricle has hyperdynamic systolic function, with an  ejection fraction of >65%. The cavity size was normal.  There is moderate  concentric left ventricular hypertrophy. Left ventricular diastolic  Doppler parameters are consistent with  impaired relaxation. Indeterminate filling pressures No evidence of left  ventricular regional wall motion abnormalities. An intraventricular  "gradient" of 2 m/s at rest, 3.2 m/s with the Valsalva maneuver is noted.  2. The right ventricle has normal systolic function. The cavity was  normal. There is no increase in right ventricular wall thickness. Right  ventricular systolic pressure could not be assessed.  3. There is mild mitral annular calcification present.  4. The aorta is normal unless otherwise noted.      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 Donnetta Hutching. Vascular risk factors include HTN, HLD, DM, bilateral carotid stenosis.  Residual deficits right hemi-sensory impairment but overall stable from a stroke standpoint    PLAN:  1. Left medullary stroke: Continue clopidogrel 75 mg daily  and recommend initiating Zetia 10 mg daily for secondary stroke prevention. Maintain strict control of hypertension with blood pressure goal below 130/90, diabetes with hemoglobin A1c goal below 6.5% and cholesterol with LDL cholesterol (bad cholesterol) goal below 70 mg/dL.  I also advised the patient to eat a healthy diet with plenty of whole grains, cereals, fruits and vegetables, exercise regularly with at least 30 minutes of continuous activity daily and maintain ideal body weight.  Stroke deficits stable and does not interfere with daily activities.  Advised her to ensure she pays attention to temperature variation and frequent skin checks due to subjective decrease sensory 2. HTN: Advised to continue current treatment regimen.  Today's BP stable.  Advised to continue to monitor at home along with continued follow-up with PCP for management 3. HLD: Initiate Zetia 10 mg daily due to statin intolerance.  Has follow-up with PCP in the next couple of months and plans on having lab work at that time 4. DMII: Advised to continue to monitor glucose levels at home along with continued follow-up with PCP for management and monitoring 5. Carotid stenosis: Continue to follow with Dr. Donnetta Hutching with having repeat carotid ultrasound 9 months post procedure    Follow up in 4 months or call earlier if needed   Greater than 50% of time during this 45 minute visit was spent on counseling, explanation of diagnosis of left medullary stroke, reviewing risk factor management of HTN, HLD, DM and carotid stenosis, planning of further management along with potential future management, and discussion with patient and family answering all questions.    Frann Rider, AGNP-BC  [facility] [address] [place], [place] [address]  Phone [phone] Fax [phone] Note: This document was prepared with digital dictation and possible smart phrase technology. Any transcriptional errors that result from this process are unintentional.

## 2019-02-19 NOTE — Progress Notes (Signed)
I agree with the above plan 

## 2019-02-19 NOTE — Patient Instructions (Signed)
Continue clopidogrel 75 mg daily  and recommend starting Zetia 10mg  daily  for secondary stroke prevention  Continue to follow up with PCP regarding cholesterol, diabetes and blood pressure management   Follow up with Dr. Donnetta Hutching 9 months post procedure for repeat ultrasound  Continue to monitor blood pressure at home  Maintain strict control of hypertension with blood pressure goal below 130/90, diabetes with hemoglobin A1c goal below 6.5% and cholesterol with LDL cholesterol (bad cholesterol) goal below 70 mg/dL. I also advised the patient to eat a healthy diet with plenty of whole grains, cereals, fruits and vegetables, exercise regularly and maintain ideal body weight.  Followup in the future with me in 4 months or call earlier if needed       Thank you for coming to see [place] at [facility]. I hope we have been able to provide you high quality care today.  You may receive a patient satisfaction survey over the next few weeks. We would appreciate your feedback and comments so that we may continue to improve ourselves and the health of our patients.

## 2019-03-06 NOTE — Progress Notes (Signed)
Virtual Visit via Audio Note  I connected with patient on 03/10/19 at 11:45 AM EST by audio enabled telemedicine application and verified that I am speaking with the correct person using two identifiers.   THIS ENCOUNTER IS A VIRTUAL VISIT DUE TO COVID-19 - PATIENT WAS NOT SEEN IN THE OFFICE. PATIENT HAS CONSENTED TO VIRTUAL VISIT / TELEMEDICINE VISIT   Location of patient: home  Location of provider: office  I discussed the limitations of evaluation and management by telemedicine and the availability of in person appointments. The patient expressed understanding and agreed to proceed.   Subjective:   Wanda [name] is a 79 y.o. female who presents for Medicare Annual (Subsequent) preventive examination.  Review of Systems:  Home Safety/Smoke Alarms: Feels safe in home. Smoke alarms in place.  Lives alone in first floor apt. No stairs.   Female:         Mammo- 09/11/18      Dexa scan-  01/16/18      CCS- last cologuard 2017. Another ordered today.    Objective:     Vitals: Unable to assess. This visit is enabled though telemedicine due to Covid 19.   Advanced Directives 03/10/2019 11/14/2018 11/06/2018 10/29/2018 09/20/2018 09/19/2018 07/02/2018  Does Patient Have a Medical Advance Directive? No No No No - No No  Does patient want to make changes to medical advance directive? No - Patient declined - - - - - -  Would patient like information on creating a medical advance directive? - No - Patient declined No - Patient declined No - Patient declined No - Patient declined - No - Patient declined    Tobacco Social History   Tobacco Use  Smoking Status Never Smoker  Smokeless Tobacco Never Used     Counseling given: Not Answered   Clinical Intake: Pain : No/denies pain     Past Medical History:  Diagnosis Date  . Arthritis   . Carotid stenosis 11/2018  . Cataract    surgery to remove  . Diabetes mellitus    type 2  . Hyperlipidemia   . Hypertension   . Skin  cancer    Removed from face  . Stroke ([place]) 09/19/2018  . Urgency of urination   . Urinary leakage    Past Surgical History:  Procedure Laterality Date  . ABDOMINAL HYSTERECTOMY  early 80's   total  . CAROTID ENDARTERECTOMY Left 11/13/2018  . CARPAL TUNNEL RELEASE Bilateral 1980 and 1981   both hands  . ENDARTERECTOMY Left 11/13/2018   Procedure: Left Carotid Artery Endarterectomy with Patch Angioplasty;  Surgeon: Rosetta Posner, MD;  Location: [facility];  Service: Vascular;  Laterality: Left;  . EYE SURGERY     bilateral cataracts  . Fatty Tumor Excision    . JOINT REPLACEMENT    . TONSILLECTOMY    . TONSILLECTOMY AND ADENOIDECTOMY  age 35  . TOTAL KNEE ARTHROPLASTY Right 10/04/2015  . TOTAL KNEE ARTHROPLASTY Right 10/04/2015   Procedure: TOTAL KNEE ARTHROPLASTY;  Surgeon: Dorna Leitz, MD;  Location: [facility];  Service: Orthopedics;  Laterality: Right;  . TOTAL KNEE ARTHROPLASTY Left 01/28/2016   Procedure: TOTAL KNEE ARTHROPLASTY;  Surgeon: Dorna Leitz, MD;  Location: [facility];  Service: Orthopedics;  Laterality: Left;  . TUBAL LIGATION     Family History  Problem Relation Age of Onset  . Pancreatitis Father        deceased 86  . Cancer Other   . Cancer Brother  GI  . Cancer Mother        liver  . Cancer Son        terminal kidney   Social History   Socioeconomic History  . Marital status: Widowed    Spouse name: Not on file  . Number of children: Not on file  . Years of education: Not on file  . Highest education level: Not on file  Occupational History  . Not on file  Tobacco Use  . Smoking status: Never Smoker  . Smokeless tobacco: Never Used  Substance and Sexual Activity  . Alcohol use: Yes    Alcohol/week: 1.0 standard drinks    Types: 1 Standard drinks or equivalent per week    Comment: maybe once per month - 3 drinks  . Drug use: No  . Sexual activity: Not Currently    Birth control/protection: Post-menopausal    Comment: Hysterectomy  Other Topics  Concern  . Not on file  Social History Narrative   Widowed. Education: [employer]. Exercise: 2 times a week for 30 minutes.   Social Determinants of Health   Financial Resource Strain: Low Risk   . Difficulty of Paying Living Expenses: Not hard at all  Food Insecurity: No Food Insecurity  . Worried About Charity fundraiser in the Last Year: Never true  . Ran Out of Food in the Last Year: Never true  Transportation Needs: No Transportation Needs  . Lack of Transportation (Medical): No  . Lack of Transportation (Non-Medical): No  Physical Activity:   . Days of Exercise per Week: Not on file  . Minutes of Exercise per Session: Not on file  Stress:   . Feeling of Stress : Not on file  Social Connections:   . Frequency of Communication with Friends and Family: Not on file  . Frequency of Social Gatherings with Friends and Family: Not on file  . Attends Religious Services: Not on file  . Active Member of Clubs or Organizations: Not on file  . Attends Archivist Meetings: Not on file  . Marital Status: Not on file    Outpatient Encounter Medications as of 03/10/2019  Medication Sig  . amLODipine (NORVASC) 5 MG tablet Take 1 tablet (5 mg total) by mouth daily.  . blood glucose meter kit and supplies Dispense based on patient and insurance preference. Pt just needs meter  . Blood Glucose Monitoring Suppl (ONE TOUCH ULTRA MINI) w/Device KIT Use to test blood sugar daily as instructed. Dx: E11.65  . cloNIDine (CATAPRES) 0.1 MG tablet Take 1 tablet (0.1 mg total) by mouth 2 (two) times daily. (Patient taking differently: Take 0.1 mg by mouth 2 (two) times daily. ONE WHOLE TABLET BID)  . clopidogrel (PLAVIX) 75 MG tablet Take 1 tablet (75 mg total) by mouth daily.  Marland Kitchen glipiZIDE (GLUCOTROL XL) 5 MG 24 hr tablet Take 10 mg am and 5 mg pm for diabetes (Patient taking differently: Take 10 mg by mouth daily with breakfast. )  . glucose blood (ONE TOUCH ULTRA TEST) test strip Test blood sugar  3 times a day. Dx code: 250.00  . Insulin Glargine (LANTUS SOLOSTAR) 100 UNIT/ML Solostar Pen Inject 15-20 Units into the skin daily.  . Insulin Pen Needle (BD PEN NEEDLE NANO U/F) 32G X 4 MM MISC USE TO INJECT INSULIN ONCE DAILY  . Insulin Syringe-Needle U-100 (INSULIN SYRINGE .5CC/31GX5/16") 31G X 5/16" 0.5 ML MISC Use to inject insulin 1 time daily.  . niacin (NIASPAN) 500 MG CR tablet  Take 1 tablet (500 mg total) by mouth at bedtime. Increase to 2 tablets after 1 month (Patient taking differently: Take 500 mg by mouth daily. )  . vitamin E 200 UNIT capsule Take 200 Units by mouth daily.   Marland Kitchen ezetimibe (ZETIA) 10 MG tablet Take 1 tablet (10 mg total) by mouth daily. (Patient not taking: Reported on 03/10/2019)  . hydrochlorothiazide (HYDRODIURIL) 25 MG tablet Take 1 tablet (25 mg total) by mouth daily. (Patient not taking: Reported on 03/10/2019)  . [DISCONTINUED] lisinopril (PRINIVIL,ZESTRIL) 20 MG tablet Take 20 mg by mouth daily.    No facility-administered encounter medications on file as of 03/10/2019.    Activities of Daily Living In your present state of health, do you have any difficulty performing the following activities: 03/10/2019 11/14/2018  Hearing? N -  Vision? N -  Difficulty concentrating or making decisions? N -  Walking or climbing stairs? N -  Dressing or bathing? N -  Doing errands, shopping? N N  Preparing Food and eating ? N -  Using the Toilet? N -  In the past six months, have you accidently leaked urine? Y -  Do you have problems with loss of bowel control? N -  Managing your Medications? N -  Managing your Finances? N -  Housekeeping or managing your Housekeeping? N -  Some recent data might be hidden    Patient Care Team: Copland, Gay Filler, MD as PCP - General (Family Medicine)    Assessment:   This is a routine wellness examination for Deshauna. Physical assessment deferred to PCP.  Exercise Activities and Dietary recommendations Current Exercise Habits: The  patient does not participate in regular exercise at present, Exercise limited by: None identified Diet (meal preparation, eat out, water intake, caffeinated beverages, dairy products, fruits and vegetables): well balanced Breakfast: yogurt and fruit Lunch: salad and sardines Dinner:  Meat and salad   Goals    . Increase physical activity       Fall Risk Fall Risk  03/10/2019 03/04/2018 03/01/2017 12/16/2015 10/07/2014  Falls in the past year? 0 0 No No No  Comment - - - Emmi Telephone Survey: data to providers prior to load -  Number falls in past yr: 0 - - - -  Injury with Fall? 0 - - - -  Follow up Education provided;Falls prevention discussed - - - -   Depression Screen PHQ 2/9 Scores 03/10/2019 03/04/2018 03/01/2017 10/18/2015  PHQ - 2 Score 0 0 0 0     Cognitive Function Ad8 score reviewed for issues:  Issues making decisions:no  Less interest in hobbies / activities:no  Repeats questions, stories (family complaining):no  Trouble using ordinary gadgets (microwave, computer, phone):no  Forgets the month or year: no  Mismanaging finances: no  Remembering appts:no  Daily problems with thinking and/or memory:no Ad8 score is=0     MMSE - Mini Mental State Exam 03/04/2018 03/01/2017  Orientation to time 5 5  Orientation to Place 5 5  Registration 3 3  Attention/ Calculation 5 5  Recall 3 3  Language- name 2 objects 2 2  Language- repeat 1 1  Language- follow 3 step command 3 3  Language- read & follow direction 1 1  Write a sentence 1 1  Copy design 1 1  Total score 30 30        There is no immunization history for the selected administration types on file for this patient.   Screening Tests Health Maintenance  Topic Date  Due  . TETANUS/TDAP  09/18/2019 (Originally 01/09/2014)  . PNA vac Low Risk Adult (1 of 2 - PCV13) 09/18/2019 (Originally 01/11/2005)  . HEMOGLOBIN A1C  08/05/2019  . MAMMOGRAM  09/11/2019  . OPHTHALMOLOGY EXAM  10/28/2019  . FOOT EXAM   02/05/2020  . DEXA SCAN  Completed  . INFLUENZA VACCINE  Discontinued      Plan:   See you next year!  Continue to eat heart healthy diet (full of fruits, vegetables, whole grains, lean protein, water--limit salt, fat, and sugar intake) and increase physical activity as tolerated.  Continue doing brain stimulating activities (puzzles, reading, adult coloring books, staying active) to keep memory sharp.   Bring a copy of your living will and/or healthcare power of attorney to your next office visit.  I have ordered a Cologuard for you. I have personally reviewed and noted the following in the patient's chart:   . Medical and social history . Use of alcohol, tobacco or illicit drugs  . Current medications and supplements . Functional ability and status . Nutritional status . Physical activity . Advanced directives . List of other physicians . Hospitalizations, surgeries, and ER visits in previous 12 months . Vitals . Screenings to include cognitive, depression, and falls . Referrals and appointments  In addition, I have reviewed and discussed with patient certain preventive protocols, quality metrics, and best practice recommendations. A written personalized care plan for preventive services as well as general preventive health recommendations were provided to patient.     Shela Nevin, [place]  03/10/2019

## 2019-03-07 ENCOUNTER — Ambulatory Visit: Payer: Self-pay | Admitting: *Deleted

## 2019-03-10 ENCOUNTER — Ambulatory Visit (INDEPENDENT_AMBULATORY_CARE_PROVIDER_SITE_OTHER): Payer: Medicare Other | Admitting: *Deleted

## 2019-03-10 ENCOUNTER — Encounter: Payer: Self-pay | Admitting: *Deleted

## 2019-03-10 DIAGNOSIS — Z1211 Encounter for screening for malignant neoplasm of colon: Secondary | ICD-10-CM | POA: Diagnosis not present

## 2019-03-10 NOTE — Patient Instructions (Signed)
See you next year!  Continue to eat heart healthy diet (full of fruits, vegetables, whole grains, lean protein, water--limit salt, fat, and sugar intake) and increase physical activity as tolerated.  Continue doing brain stimulating activities (puzzles, reading, adult coloring books, staying active) to keep memory sharp.   Bring a copy of your living will and/or healthcare power of attorney to your next office visit.  I have ordered a Cologuard for you.   Wanda [name] , Thank you for taking time to come for your Medicare Wellness Visit. I appreciate your ongoing commitment to your health goals. Please review the following plan we discussed and let me know if I can assist you in the future.   These are the goals we discussed: Goals    . Increase physical activity       This is a list of the screening recommended for you and due dates:  Health Maintenance  Topic Date Due  . Tetanus Vaccine  09/18/2019*  . Pneumonia vaccines (1 of 2 - PCV13) 09/18/2019*  . Hemoglobin A1C  08/05/2019  . Mammogram  09/11/2019  . Eye exam for diabetics  10/28/2019  . Complete foot exam   02/05/2020  . DEXA scan (bone density measurement)  Completed  . Flu Shot  Discontinued  *Topic was postponed. The date shown is not the original due date.    Preventive Care 50 Years and Older, Female Preventive care refers to lifestyle choices and visits with your health care provider that can promote health and wellness. This includes:  A yearly physical exam. This is also called an annual well check.  Regular dental and eye exams.  Immunizations.  Screening for certain conditions.  Healthy lifestyle choices, such as diet and exercise. What can I expect for my preventive care visit? Physical exam Your health care provider will check:  Height and weight. These may be used to calculate body mass index (BMI), which is a measurement that tells if you are at a healthy weight.  Heart rate and blood  pressure.  Your skin for abnormal spots. Counseling Your health care provider may ask you questions about:  Alcohol, tobacco, and drug use.  Emotional well-being.  Home and relationship well-being.  Sexual activity.  Eating habits.  History of falls.  Memory and ability to understand (cognition).  Work and work Statistician.  Pregnancy and menstrual history. What immunizations do I need?  Influenza (flu) vaccine  This is recommended every year. Tetanus, diphtheria, and pertussis (Tdap) vaccine  You may need a Td booster every 10 years. Varicella (chickenpox) vaccine  You may need this vaccine if you have not already been vaccinated. Zoster (shingles) vaccine  You may need this after age 57. Pneumococcal conjugate (PCV13) vaccine  One dose is recommended after age 74. Pneumococcal polysaccharide (PPSV23) vaccine  One dose is recommended after age 61. Measles, mumps, and rubella (MMR) vaccine  You may need at least one dose of MMR if you were born in 1957 or later. You may also need a second dose. Meningococcal conjugate (MenACWY) vaccine  You may need this if you have certain conditions. Hepatitis A vaccine  You may need this if you have certain conditions or if you travel or work in places where you may be exposed to hepatitis A. Hepatitis B vaccine  You may need this if you have certain conditions or if you travel or work in places where you may be exposed to hepatitis B. Haemophilus influenzae type b (Hib) vaccine  You  may need this if you have certain conditions. You may receive vaccines as individual doses or as more than one vaccine together in one shot (combination vaccines). Talk with your health care provider about the risks and benefits of combination vaccines. What tests do I need? Blood tests  Lipid and cholesterol levels. These may be checked every 5 years, or more frequently depending on your overall health.  Hepatitis C test.  Hepatitis B  test. Screening  Lung cancer screening. You may have this screening every year starting at age 81 if you have a 30-pack-year history of smoking and currently smoke or have quit within the past 15 years.  Colorectal cancer screening. All adults should have this screening starting at age 69 and continuing until age 1. Your health care provider may recommend screening at age 8 if you are at increased risk. You will have tests every 1-10 years, depending on your results and the type of screening test.  Diabetes screening. This is done by checking your blood sugar (glucose) after you have not eaten for a while (fasting). You may have this done every 1-3 years.  Mammogram. This may be done every 1-2 years. Talk with your health care provider about how often you should have regular mammograms.  BRCA-related cancer screening. This may be done if you have a family history of breast, ovarian, tubal, or peritoneal cancers. Other tests  Sexually transmitted disease (STD) testing.  Bone density scan. This is done to screen for osteoporosis. You may have this done starting at age 57. Follow these instructions at home: Eating and drinking  Eat a diet that includes fresh fruits and vegetables, whole grains, lean protein, and low-fat dairy products. Limit your intake of foods with high amounts of sugar, saturated fats, and salt.  Take vitamin and mineral supplements as recommended by your health care provider.  Do not drink alcohol if your health care provider tells you not to drink.  If you drink alcohol: ? Limit how much you have to 0-1 drink a day. ? Be aware of how much alcohol is in your drink. In the [place] one drink equals one 12 oz bottle of beer (355 mL), one 5 oz glass of wine (148 mL), or one 1 oz glass of hard liquor (44 mL). Lifestyle  Take daily care of your teeth and gums.  Stay active. Exercise for at least 30 minutes on 5 or more days each week.  Do not use any products that  contain nicotine or tobacco, such as cigarettes, e-cigarettes, and chewing tobacco. If you need help quitting, ask your health care provider.  If you are sexually active, practice safe sex. Use a condom or other form of protection in order to prevent STIs (sexually transmitted infections).  Talk with your health care provider about taking a low-dose aspirin or statin. What's next?  Go to your health care provider once a year for a well check visit.  Ask your health care provider how often you should have your eyes and teeth checked.  Stay up to date on all vaccines. This information is not intended to replace advice given to you by your health care provider. Make sure you discuss any questions you have with your health care provider. Document Revised: 12/20/2017 Document Reviewed: 12/20/2017 Elsevier Patient Education  2020 [employer].

## 2019-03-17 ENCOUNTER — Encounter: Payer: Self-pay | Admitting: Family Medicine

## 2019-03-17 MED ORDER — CLONIDINE HCL 0.1 MG PO TABS: 0.1000 mg | ORAL_TABLET | Freq: Two times a day (BID) | ORAL | 5 refills | Status: DC

## 2019-03-20 ENCOUNTER — Encounter: Payer: Self-pay | Admitting: Family Medicine

## 2019-03-20 DIAGNOSIS — Z1211 Encounter for screening for malignant neoplasm of colon: Secondary | ICD-10-CM | POA: Diagnosis not present

## 2019-03-25 LAB — COLOGUARD: Cologuard: NEGATIVE

## 2019-03-26 ENCOUNTER — Encounter: Payer: Self-pay | Admitting: Family Medicine

## 2019-04-15 DIAGNOSIS — Z23 Encounter for immunization: Secondary | ICD-10-CM | POA: Diagnosis not present

## 2019-04-17 ENCOUNTER — Encounter: Payer: Self-pay | Admitting: Family Medicine

## 2019-04-17 MED ORDER — CLOPIDOGREL BISULFATE 75 MG PO TABS: 75.0000 mg | ORAL_TABLET | Freq: Every day | ORAL | 1 refills | Status: DC

## 2019-04-17 MED ORDER — LANTUS SOLOSTAR 100 UNIT/ML ~~LOC~~ SOPN: 15.0000 [IU] | PEN_INJECTOR | Freq: Every day | SUBCUTANEOUS | 11 refills | Status: DC

## 2019-05-13 DIAGNOSIS — Z23 Encounter for immunization: Secondary | ICD-10-CM | POA: Diagnosis not present

## 2019-05-28 ENCOUNTER — Encounter: Payer: Self-pay | Admitting: Family Medicine

## 2019-05-28 MED ORDER — FREESTYLE LIBRE 2 READER DEVI: 0 refills | Status: DC

## 2019-05-28 MED ORDER — FREESTYLE LIBRE 2 SENSOR MISC: 0 refills | Status: DC

## 2019-06-10 DIAGNOSIS — L814 Other melanin hyperpigmentation: Secondary | ICD-10-CM | POA: Diagnosis not present

## 2019-06-10 DIAGNOSIS — D225 Melanocytic nevi of trunk: Secondary | ICD-10-CM | POA: Diagnosis not present

## 2019-06-10 DIAGNOSIS — D1801 Hemangioma of skin and subcutaneous tissue: Secondary | ICD-10-CM | POA: Diagnosis not present

## 2019-06-10 DIAGNOSIS — L821 Other seborrheic keratosis: Secondary | ICD-10-CM | POA: Diagnosis not present

## 2019-06-23 ENCOUNTER — Ambulatory Visit (INDEPENDENT_AMBULATORY_CARE_PROVIDER_SITE_OTHER): Payer: Medicare Other | Admitting: Adult Health

## 2019-06-23 ENCOUNTER — Encounter: Payer: Self-pay | Admitting: Adult Health

## 2019-06-23 ENCOUNTER — Other Ambulatory Visit: Payer: Self-pay

## 2019-06-23 VITALS — BP 158/87 | HR 79 | Ht 60.0 in | Wt 181.0 lb

## 2019-06-23 DIAGNOSIS — I10 Essential (primary) hypertension: Secondary | ICD-10-CM | POA: Diagnosis not present

## 2019-06-23 DIAGNOSIS — E119 Type 2 diabetes mellitus without complications: Secondary | ICD-10-CM

## 2019-06-23 DIAGNOSIS — E785 Hyperlipidemia, unspecified: Secondary | ICD-10-CM

## 2019-06-23 DIAGNOSIS — I6523 Occlusion and stenosis of bilateral carotid arteries: Secondary | ICD-10-CM

## 2019-06-23 DIAGNOSIS — Z794 Long term (current) use of insulin: Secondary | ICD-10-CM | POA: Diagnosis not present

## 2019-06-23 DIAGNOSIS — I639 Cerebral infarction, unspecified: Secondary | ICD-10-CM

## 2019-06-23 NOTE — Patient Instructions (Signed)
Continue clopidogrel 75 mg daily for secondary stroke prevention  Please follow up with PCP for repeat cholesterol levels and recommend use of cholesterol medication if LDL>70. May need to see a cardiologist for further recommendations  Continue to follow up with PCP regarding cholesterol, diabetes and blood pressure management  Follow-up with Dr. Donnetta Hutching with repeat carotid ultrasound 9 months post procedure  Continue to monitor blood pressure at home  Maintain strict control of hypertension with blood pressure goal below 130/90, diabetes with hemoglobin A1c goal below 6.5% and cholesterol with LDL cholesterol (bad cholesterol) goal below 70 mg/dL. I also advised the patient to eat a healthy diet with plenty of whole grains, cereals, fruits and vegetables, exercise regularly and maintain ideal body weight.  Followup in the future with me in 6 months or call earlier if needed       Thank you for coming to see [place] at [facility]. I hope we have been able to provide you high quality care today.  You may receive a patient satisfaction survey over the next few weeks. We would appreciate your feedback and comments so that we may continue to improve ourselves and the health of our patients.

## 2019-06-23 NOTE — Progress Notes (Signed)
[facility] [address] [place]. [place] [address] [phone]       HOSPITAL FOLLOW UP NOTE  Wanda [name] Date of Birth:  1940/09/29 Medical Record Number:  [id number]   Reason for Referral:  hospital stroke follow up    CHIEF COMPLAINT:  Chief Complaint  Patient presents with  . Follow-up    Rm 9 here for a stoke f/u. Pt is not having any new sx. Pt said she is still having the same sx.    HPI:   Today, 06/23/2019, Wanda [name] is being seen for stroke follow-up.  Residual deficit of right hemisensory impairment with some improvement and typically only noticeable with temperature such as when she is showering. Denies new stroke/TIA symptoms.  Continues on clopidogrel for secondary stroke prevention. Previously on crestor but states unable to tolerate - placed on Zetia but has self discontinued due to difficulty tolerating. Continues to follow with PCP for HTN, HLD and DM management. Plans on f/u in the near future for repeat lab work. No concerns at this time.   History provided for reference purposes only Initial visit 02/19/2019 JM: Wanda [name] is a 79 year old female who is being seen today for hospital follow-up.  Residual deficits of right hemisensory impairment which has been stable. Denies pain, weakness, numbness or tingling.  She returned back to all prior activities without difficulty. she was evaluated by Dr. Donnetta Hutching and felt recent stroke likely due to large vessel disease therefore underwent left carotid endarterectomy on 10/11/5850 without complication.  She was advised to follow-up 9 months post procedure for repeat carotid duplex.  Continues on Plavix for secondary stroke.  Discontinued Crestor due to possible statin myalgias.  Recent A1c 9.0.  Continues to follow with PCP for HTN, HLD and DM management.  Denies new or worsening stroke/TIA symptoms.   Stroke admission 09/19/2018: Wanda [name] is a 79 y.o. female with history of DB and  HTN  who presented on 09/19/2018 with not feeling well, difficulty swallowing.  Evaluated by stroke team and Dr. Erlinda Hong with stroke work-up revealing left lateral medullary infarct as evidenced on MRI secondary to small or large vessel disease source.  CTA showed bilateral ICA stenosis right 75% and left 70% but they will asymptomatic and recommended follow-up with [facility] outpatient for ongoing monitoring and management.  Initiated Plavix for secondary stroke prevention with history of aspirin.  HTN stable and recommended long-term BP goal 1 30-1 50 given bilateral ICA stenosis.  LDL 158 initiated Crestor 20 mg daily.  History of DM with A1c 8.6 with concerns of noncompliance.  No prior history of stroke.  Discharged home in stable condition on 09/23/2018.        ROS:   14 system review of systems performed and negative with exception of decreased sensation  PMH:  Past Medical History:  Diagnosis Date  . Arthritis   . Carotid stenosis 11/2018  . Cataract    surgery to remove  . Diabetes mellitus    type 2  . Hyperlipidemia   . Hypertension   . Skin cancer    Removed from face  . Stroke ([place]) 09/19/2018  . Urgency of urination   . Urinary leakage     PSH:  Past Surgical History:  Procedure Laterality Date  . ABDOMINAL HYSTERECTOMY  early 80's   total  . CAROTID ENDARTERECTOMY Left 11/13/2018  . CARPAL TUNNEL RELEASE Bilateral 1980 and 1981   both hands  . ENDARTERECTOMY Left 11/13/2018   Procedure:  Left Carotid Artery Endarterectomy with Patch Angioplasty;  Surgeon: Rosetta Posner, MD;  Location: [place];  Service: Vascular;  Laterality: Left;  . EYE SURGERY     bilateral cataracts  . Fatty Tumor Excision    . JOINT REPLACEMENT    . TONSILLECTOMY    . TONSILLECTOMY AND ADENOIDECTOMY  age 72  . TOTAL KNEE ARTHROPLASTY Right 10/04/2015  . TOTAL KNEE ARTHROPLASTY Right 10/04/2015   Procedure: TOTAL KNEE ARTHROPLASTY;  Surgeon: Dorna Leitz, MD;  Location: [place];  Service: Orthopedics;   Laterality: Right;  . TOTAL KNEE ARTHROPLASTY Left 01/28/2016   Procedure: TOTAL KNEE ARTHROPLASTY;  Surgeon: Dorna Leitz, MD;  Location: [place];  Service: Orthopedics;  Laterality: Left;  . TUBAL LIGATION      Social History:  Social History   Socioeconomic History  . Marital status: Widowed    Spouse name: Not on file  . Number of children: Not on file  . Years of education: Not on file  . Highest education level: Not on file  Occupational History  . Not on file  Tobacco Use  . Smoking status: Never Smoker  . Smokeless tobacco: Never Used  Vaping Use  . Vaping Use: Never used  Substance and Sexual Activity  . Alcohol use: Yes    Alcohol/week: 1.0 standard drink    Types: 1 Standard drinks or equivalent per week    Comment: maybe once per month - 3 drinks  . Drug use: No  . Sexual activity: Not Currently    Birth control/protection: Post-menopausal    Comment: Hysterectomy  Other Topics Concern  . Not on file  Social History Narrative   Widowed. Education: [employer]. Exercise: 2 times a week for 30 minutes.   Social Determinants of Health   Financial Resource Strain: Low Risk   . Difficulty of Paying Living Expenses: Not hard at all  Food Insecurity: No Food Insecurity  . Worried About Charity fundraiser in the Last Year: Never true  . Ran Out of Food in the Last Year: Never true  Transportation Needs: No Transportation Needs  . Lack of Transportation (Medical): No  . Lack of Transportation (Non-Medical): No  Physical Activity:   . Days of Exercise per Week:   . Minutes of Exercise per Session:   Stress:   . Feeling of Stress :   Social Connections:   . Frequency of Communication with Friends and Family:   . Frequency of Social Gatherings with Friends and Family:   . Attends Religious Services:   . Active Member of Clubs or Organizations:   . Attends Archivist Meetings:   Marland Kitchen Marital Status:   Intimate Partner Violence:   . Fear of Current or  Ex-Partner:   . Emotionally Abused:   Marland Kitchen Physically Abused:   . Sexually Abused:     Family History:  Family History  Problem Relation Age of Onset  . Pancreatitis Father        deceased 21  . Cancer Other   . Cancer Brother        GI  . Cancer Mother        liver  . Cancer Son        terminal kidney    Medications:   Current Outpatient Medications on File Prior to Visit  Medication Sig Dispense Refill  . amLODipine (NORVASC) 5 MG tablet Take 1 tablet (5 mg total) by mouth daily. (Patient taking differently: Take 5 mg by mouth daily. Pt  taking 2.5 mg daily) 90 tablet 3  . blood glucose meter kit and supplies Dispense based on patient and insurance preference. Pt just needs meter 1 each 0  . Blood Glucose Monitoring Suppl (ONE TOUCH ULTRA MINI) w/Device KIT Use to test blood sugar daily as instructed. Dx: E11.65 1 each 0  . cloNIDine (CATAPRES) 0.1 MG tablet Take 1 tablet (0.1 mg total) by mouth 2 (two) times daily. ONE WHOLE TABLET BID 60 tablet 5  . clopidogrel (PLAVIX) 75 MG tablet Take 1 tablet (75 mg total) by mouth daily. 90 tablet 1  . glipiZIDE (GLUCOTROL XL) 5 MG 24 hr tablet Take 10 mg am and 5 mg pm for diabetes (Patient taking differently: Take 10 mg by mouth daily with breakfast. ) 90 tablet 6  . glucose blood (ONE TOUCH ULTRA TEST) test strip Test blood sugar 3 times a day. Dx code: 250.00 100 each 12  . insulin glargine (LANTUS SOLOSTAR) 100 UNIT/ML Solostar Pen Inject 15-20 Units into the skin daily. 5 pen 11  . Insulin Pen Needle (BD PEN NEEDLE NANO U/F) 32G X 4 MM MISC USE TO INJECT INSULIN ONCE DAILY 100 each 6  . Insulin Syringe-Needle U-100 (INSULIN SYRINGE .5CC/31GX5/16") 31G X 5/16" 0.5 ML MISC Use to inject insulin 1 time daily. 90 each 3  . vitamin E 200 UNIT capsule Take 200 Units by mouth daily.     . [DISCONTINUED] lisinopril (PRINIVIL,ZESTRIL) 20 MG tablet Take 20 mg by mouth daily.      No current facility-administered medications on file prior to  visit.    Allergies:   Allergies  Allergen Reactions  . Ace Inhibitors Diarrhea, Swelling, Other (See Comments) and Cough    Pt had cough and diarrhea with first few doses of medication; also had swelling of right eyelid. Stopped medication on 03/17/11.  . Alendronate Sodium Other (See Comments)    "Made my whole body hurt"  . Aspirin Hives  . Crestor [Rosuvastatin] Other (See Comments)    "Made my whole body hurt"  . Lipitor [Atorvastatin Calcium] Other (See Comments)    "Made my whole body hurt"  . Metformin And Related     Severe abdominal pain  . Shellfish Allergy Nausea Only    Intolerant of fresh shellfish, reports the reaction is GI upset, denies hives, denies any swelling  Reports that she can tolerate canned seafood.      Physical Exam  Vitals:   06/23/19 1422 06/23/19 1426  BP: (!) 187/86 (!) 168/80  Pulse: 97 95  Weight: 181 lb (82.1 kg)   Height: 5' (1.524 m)    Body mass index is 35.35 kg/m. No exam data present  General: well developed, well nourished, pleasant elderly Caucasian female, seated, in no evident distress Head: head normocephalic and atraumatic.   Neck: supple with no carotid or supraclavicular bruits Cardiovascular: regular rate and rhythm, no murmurs Musculoskeletal: no deformity Skin:  no rash/petichiae Vascular:  Normal pulses all extremities   Neurologic Exam Mental Status: Awake and fully alert. Fluent speech and language.  Oriented to place and time. Recent and remote memory intact. Attention span, concentration and fund of knowledge appropriate. Mood and affect appropriate.  Cranial Nerves: Pupils equal, briskly reactive to light. Extraocular movements full without nystagmus. Visual fields full to confrontation. Hearing intact. Facial sensation intact. Face, tongue, palate moves normally and symmetrically.  Motor: Normal bulk and tone. Normal strength in all tested extremity muscles. Sensory.: intact to touch , pinprick , position and  vibratory sensation  Coordination: Rapid alternating movements normal in all extremities. Finger-to-nose and heel-to-shin performed accurately bilaterally. Gait and Station: Arises from chair without difficulty. Stance is normal. Gait demonstrates normal stride length and balance Reflexes: 1+ and symmetric. Toes downgoing.       ASSESSMENT: Wanda [name] is a 79 y.o. year old female presented with not feeling well and difficulty swallowing on 09/20/2018 with stroke work-up revealing left lateral medullary infarct secondary to large vessel disease and underwent left carotid enterectomy on 11/13/2018 with Dr. Donnetta Hutching. Vascular risk factors include HTN, HLD, DM, bilateral carotid stenosis.  Residual deficit of right hemisensory impairment with temperature variation     PLAN:  1. Left medullary stroke: Continue clopidogrel 75 mg daily for secondary stroke prevention.  Maintain strict control of hypertension with blood pressure goal below 130/90, diabetes with hemoglobin A1c goal below 6.5% and cholesterol with LDL cholesterol (bad cholesterol) goal below 70 mg/dL.  I also advised the patient to eat a healthy diet with plenty of whole grains, cereals, fruits and vegetables, exercise regularly with at least 30 minutes of continuous activity daily and maintain ideal body weight.   2. HTN: Elevated at today's visit but reports usually stable at home.  Ongoing follow-up with PCP for monitoring and management 3. HLD: History of statin intolerance along with intolerance to Zetia.  She plans on repeat lab work in the near future PCP and if LDL>70, would recommend initiation of cholesterol medication management with consideration of PCSK9 inhibitor 4. DMII: Continue to follow with PCP 5. Carotid stenosis: Continue to follow with Dr. Donnetta Hutching with having repeat carotid ultrasound 9 months post procedure (around 08/2019)    Follow up in 6 months or call earlier if needed   I spent 35 minutes of face-to-face  and non-face-to-face time with patient.  This included previsit chart review, lab review, study review, order entry, electronic health record documentation, patient education    Frann Rider, [facility]  [facility] [address] [place], [place] [address]  Phone [phone] Fax [phone] Note: This document was prepared with digital dictation and possible smart phrase technology. Any transcriptional errors that result from this process are unintentional.

## 2019-06-24 ENCOUNTER — Encounter: Payer: Self-pay | Admitting: Adult Health

## 2019-06-27 NOTE — Progress Notes (Signed)
I agree with the above plan 

## 2019-08-07 DIAGNOSIS — Z96652 Presence of left artificial knee joint: Secondary | ICD-10-CM | POA: Diagnosis not present

## 2019-08-07 DIAGNOSIS — M25561 Pain in right knee: Secondary | ICD-10-CM | POA: Diagnosis not present

## 2019-08-07 DIAGNOSIS — M25562 Pain in left knee: Secondary | ICD-10-CM | POA: Diagnosis not present

## 2019-08-07 DIAGNOSIS — Z96651 Presence of right artificial knee joint: Secondary | ICD-10-CM | POA: Diagnosis not present

## 2019-09-04 ENCOUNTER — Telehealth: Payer: Self-pay | Admitting: Family Medicine

## 2019-09-04 NOTE — Progress Notes (Signed)
  Chronic Care Management   Note  09/04/2019 Name: Wanda [name] MRN: [id number] DOB: 1941-01-02  Wanda [name] is a 79 y.o. year old female who is a primary care patient of Copland, Gay Filler, MD. I reached out to [name] by phone today in response to a referral sent by Ms. [name]'s PCP, Copland, Gay Filler, MD.   [name] was given information about Chronic Care Management services today including:  1. CCM service includes personalized support from designated clinical staff supervised by her physician, including individualized plan of care and coordination with other care providers 2. 24/7 contact phone numbers for assistance for urgent and routine care needs. 3. Service will only be billed when office clinical staff spend 20 minutes or more in a month to coordinate care. 4. Only one practitioner may furnish and bill the service in a calendar month. 5. The patient may stop CCM services at any time (effective at the end of the month) by phone call to the office staff.   Patient wishes to consider information provided and/or speak with a member of the care team before deciding about enrollment in care management services.   Follow up plan:   Carley Perdue [employer] Scheduler

## 2019-09-04 NOTE — Progress Notes (Signed)
  Chronic Care Management   Outreach Note  09/04/2019 Name: Wanda [name] MRN: [id number] DOB: 05-May-1940  Referred by: Darreld Mclean, MD Reason for referral : No chief complaint on file.   An unsuccessful telephone outreach was attempted today. The patient was referred to the pharmacist for assistance with care management and care coordination.   Follow Up Plan:   Carley Perdue [employer] Scheduler

## 2019-09-18 ENCOUNTER — Encounter: Payer: Self-pay | Admitting: Family Medicine

## 2019-09-18 MED ORDER — CLONIDINE HCL 0.1 MG PO TABS: 0.1000 mg | ORAL_TABLET | Freq: Two times a day (BID) | ORAL | 1 refills | Status: DC

## 2019-09-22 NOTE — Progress Notes (Addendum)
[place] at [facility] [address], [place], [place] [address] [phone]  Date:  09/25/2019   Name:  Wanda [name]   DOB:  1940-08-12   MRN:  [id number]  PCP:  Darreld Mclean, MD    Chief Complaint: Hyperglycemia (high readings at home)   History of Present Illness:  Wanda [name] is a 79 y.o. very pleasant female patient who presents with the following:  Pt here today with concern about her diabetes medication/ insulin Last seen by myself in January of this year  History of CVA, carotid stenosis, diabetes, hypertension, hyperlipidemia She has been evaluated by vascular surgery, status post left endarterectomy in October for carotid stenosis  Seen by neurology in June: 1. Left medullary stroke: Continue clopidogrel 75 mg daily for secondary stroke prevention.  Maintain strict control of hypertension with blood pressure goal below 130/90, diabetes with hemoglobin A1c goal below 6.5% and cholesterol with LDL cholesterol (bad cholesterol) goal below 70 mg/dL.  I also advised the patient to eat a healthy diet with plenty of whole grains, cereals, fruits and vegetables, exercise regularly with at least 30 minutes of continuous activity daily and maintain ideal body weight.   2. HTN: Elevated at today's visit but reports usually stable at home.  Ongoing follow-up with PCP for monitoring and management 3. HLD: History of statin intolerance along with intolerance to Zetia.  She plans on repeat lab work in the near future PCP and if LDL>70, would recommend initiation of cholesterol medication management with consideration of PCSK9 inhibitor 4. DMII: Continue to follow with PCP 5. Carotid stenosis: Continue to follow with Dr. Donnetta Hutching with having repeat carotid ultrasound 9 months post procedure (around 08/2019)   Mammogram- she will schedule this  covid series- done Pneumonia vaccine- pt declines today  Today pt notes that she  has "good days and bad days," sometimes she feels so tired she cannnot do much, other days she feels well  She has noted her blood sugars running too high recently- may be up to 300 at night, 200 am  Taking glipizide  10 mg daily She is also using lantus insulin- she is taking 15 to 20 units depending on how high she is running  She states I am concerned that insulin is causing her blood sugar to go higher.  I advised her that likely the actual problem is that she is not taking enough insulin Declines flu shot today She did her covid vaccine series however  Lab Results  Component Value Date   HGBA1C 9.0 (H) 02/05/2019    Patient Active Problem List   Diagnosis Date Noted  . Carotid stenosis 11/13/2018  . CVA (cerebral vascular accident) ([place]) 09/19/2018  . Brainstem infarct, acute ([place])   . Primary osteoarthritis of left knee 10/04/2015  . Osteoporosis 08/23/2015  . Multinodular goiter (nontoxic) 04/07/2014  . Diabetes mellitus type 2, uncontrolled, without complications 01/01/8249  . Insulin adverse reaction 09/27/2013  . Senile nuclear sclerosis 08/14/2012  . Essential hypertension 03/10/2011  . Hyperlipidemia with target LDL less than 70 03/10/2011  . Obesity (BMI 30.0-34.9) 03/10/2011    Past Medical History:  Diagnosis Date  . Arthritis   . Carotid stenosis 11/2018  . Cataract    surgery to remove  . Diabetes mellitus    type 2  . Hyperlipidemia   . Hypertension   . Skin cancer    Removed from face  . Stroke ([place]) 09/19/2018  .  Urgency of urination   . Urinary leakage     Past Surgical History:  Procedure Laterality Date  . ABDOMINAL HYSTERECTOMY  early 80's   total  . CAROTID ENDARTERECTOMY Left 11/13/2018  . CARPAL TUNNEL RELEASE Bilateral 1980 and 1981   both hands  . ENDARTERECTOMY Left 11/13/2018   Procedure: Left Carotid Artery Endarterectomy with Patch Angioplasty;  Surgeon: Rosetta Posner, MD;  Location: [facility];  Service: Vascular;  Laterality: Left;   . EYE SURGERY     bilateral cataracts  . Fatty Tumor Excision    . JOINT REPLACEMENT    . TONSILLECTOMY    . TONSILLECTOMY AND ADENOIDECTOMY  age 54  . TOTAL KNEE ARTHROPLASTY Right 10/04/2015  . TOTAL KNEE ARTHROPLASTY Right 10/04/2015   Procedure: TOTAL KNEE ARTHROPLASTY;  Surgeon: Dorna Leitz, MD;  Location: [place];  Service: Orthopedics;  Laterality: Right;  . TOTAL KNEE ARTHROPLASTY Left 01/28/2016   Procedure: TOTAL KNEE ARTHROPLASTY;  Surgeon: Dorna Leitz, MD;  Location: [place];  Service: Orthopedics;  Laterality: Left;  . TUBAL LIGATION      Social History   Tobacco Use  . Smoking status: Never Smoker  . Smokeless tobacco: Never Used  Vaping Use  . Vaping Use: Never used  Substance Use Topics  . Alcohol use: Yes    Alcohol/week: 1.0 standard drink    Types: 1 Standard drinks or equivalent per week    Comment: maybe once per month - 3 drinks  . Drug use: No    Family History  Problem Relation Age of Onset  . Pancreatitis Father        deceased 47  . Cancer Other   . Cancer Brother        GI  . Cancer Mother        liver  . Cancer Son        terminal kidney    Allergies  Allergen Reactions  . Ace Inhibitors Diarrhea, Swelling, Other (See Comments) and Cough    Pt had cough and diarrhea with first few doses of medication; also had swelling of right eyelid. Stopped medication on 03/17/11.  . Alendronate Sodium Other (See Comments)    "Made my whole body hurt"  . Aspirin Hives  . Crestor [Rosuvastatin] Other (See Comments)    "Made my whole body hurt"  . Lipitor [Atorvastatin Calcium] Other (See Comments)    "Made my whole body hurt"  . Metformin And Related     Severe abdominal pain  . Shellfish Allergy Nausea Only    Intolerant of fresh shellfish, reports the reaction is GI upset, denies hives, denies any swelling  Reports that she can tolerate canned seafood.     Medication list has been reviewed and updated.  Current Outpatient Medications on File Prior  to Visit  Medication Sig Dispense Refill  . amLODipine (NORVASC) 5 MG tablet Take 1 tablet (5 mg total) by mouth daily. (Patient taking differently: Take 5 mg by mouth daily. Pt taking 2.5 mg daily) 90 tablet 3  . blood glucose meter kit and supplies Dispense based on patient and insurance preference. Pt just needs meter 1 each 0  . Blood Glucose Monitoring Suppl (ONE TOUCH ULTRA MINI) w/Device KIT Use to test blood sugar daily as instructed. Dx: E11.65 1 each 0  . cloNIDine (CATAPRES) 0.1 MG tablet Take 1 tablet (0.1 mg total) by mouth 2 (two) times daily. 60 tablet 1  . clopidogrel (PLAVIX) 75 MG tablet Take 1 tablet (75 mg  total) by mouth daily. 90 tablet 1  . glipiZIDE (GLUCOTROL XL) 5 MG 24 hr tablet Take 10 mg am and 5 mg pm for diabetes (Patient taking differently: Take 10 mg by mouth daily with breakfast. ) 90 tablet 6  . glucose blood (ONE TOUCH ULTRA TEST) test strip Test blood sugar 3 times a day. Dx code: 250.00 100 each 12  . insulin glargine (LANTUS SOLOSTAR) 100 UNIT/ML Solostar Pen Inject 15-20 Units into the skin daily. 5 pen 11  . Insulin Pen Needle (BD PEN NEEDLE NANO U/F) 32G X 4 MM MISC USE TO INJECT INSULIN ONCE DAILY 100 each 6  . Insulin Syringe-Needle U-100 (INSULIN SYRINGE .5CC/31GX5/16") 31G X 5/16" 0.5 ML MISC Use to inject insulin 1 time daily. 90 each 3  . [DISCONTINUED] lisinopril (PRINIVIL,ZESTRIL) 20 MG tablet Take 20 mg by mouth daily.      No current facility-administered medications on file prior to visit.    Review of Systems:  As per HPI- otherwise negative.   Physical Examination: Vitals:   09/25/19 1334  BP: 124/72  Pulse: 78  Resp: 15  SpO2: 98%   Vitals:   09/25/19 1334  Weight: 182 lb (82.6 kg)  Height: 5' (1.524 m)   Body mass index is 35.54 kg/m. Ideal Body Weight: Weight in (lb) to have BMI = 25: 127.7  GEN: no acute distress.  Obese, looks well and her normal self HEENT: Atraumatic, Normocephalic.  Ears and Nose: No external  deformity. CV: RRR, No M/G/R. No JVD. No thrill. No extra heart sounds. PULM: CTA B, no wheezes, crackles, rhonchi. No retractions. No resp. distress. No accessory muscle use. ABD: S, NT, ND EXTR: No c/c/e PSYCH: Normally interactive. Conversant.    Assessment and Plan: Cerebrovascular accident (CVA) due to thrombosis of basilar artery ([place])  Controlled diabetes mellitus type 2 with complications, unspecified whether long term insulin use ([place]) - Plan: Hemoglobin A1c, Comprehensive metabolic panel  Essential hypertension - Plan: CBC  Hyperlipidemia with target LDL less than 70 - Plan: Lipid panel  Other fatigue - Plan: TSH  Today with concern of high blood sugars.  She is currently taking 15 to 20 units of Lantus daily as above, likely needs to be increased.  Will obtain an A1c for her today and plan accordingly Blood pressures under good control She notes fatigue, will check thyroid for her today She has mostly recovered from the effects of her stroke, still feels that she is not quite of 100% but she feels fortunate things were not worse This visit occurred during the SARS-CoV-2 public health emergency.  Safety protocols were in place, including screening questions prior to the visit, additional usage of staff PPE, and extensive cleaning of exam room while observing appropriate contact time as indicated for disinfecting solutions.    Signed Lamar Blinks, MD  Received her labs as below, message to patient  Results for orders placed or performed in visit on 09/25/19  Hemoglobin A1c  Result Value Ref Range   Hgb A1c MFr Bld 11.0 (H) <5.7 % of total Hgb   Mean Plasma Glucose 269 (calc)   eAG (mmol/L) 14.9 (calc)  CBC  Result Value Ref Range   WBC 5.5 3.8 - 10.8 Thousand/uL   RBC 4.30 3.80 - 5.10 Million/uL   Hemoglobin 12.8 11.7 - 15.5 g/dL   HCT 38.7 35 - 45 %   MCV 90.0 80.0 - 100.0 fL   MCH 29.8 27.0 - 33.0 pg   MCHC 33.1 32.0 -  36.0 g/dL   RDW 13.1 11.0 - 15.0 %    Platelets 250 140 - 400 Thousand/uL   MPV 10.2 7.5 - 12.5 fL  Comprehensive metabolic panel  Result Value Ref Range   Glucose, Bld 184 (H) 65 - 99 mg/dL   BUN 18 7 - 25 mg/dL   Creat 1.09 (H) 0.60 - 0.93 mg/dL   BUN/Creatinine Ratio 17 6 - 22 (calc)   Sodium 140 135 - 146 mmol/L   Potassium 4.4 3.5 - 5.3 mmol/L   Chloride 106 98 - 110 mmol/L   CO2 21 20 - 32 mmol/L   Calcium 9.6 8.6 - 10.4 mg/dL   Total Protein 7.0 6.1 - 8.1 g/dL   Albumin 4.2 3.6 - 5.1 g/dL   Globulin 2.8 1.9 - 3.7 g/dL (calc)   AG Ratio 1.5 1.0 - 2.5 (calc)   Total Bilirubin 0.4 0.2 - 1.2 mg/dL   Alkaline phosphatase (APISO) 56 37 - 153 U/L   AST 13 10 - 35 U/L   ALT 13 6 - 29 U/L  Lipid panel  Result Value Ref Range   Cholesterol 236 (H) <200 mg/dL   HDL 39 (L) > OR = 50 mg/dL   Triglycerides 243 (H) <150 mg/dL   LDL Cholesterol (Calc) 156 (H) mg/dL (calc)   Total CHOL/HDL Ratio 6.1 (H) <5.0 (calc)   Non-HDL Cholesterol (Calc) 197 (H) <130 mg/dL (calc)  TSH  Result Value Ref Range   TSH 1.01 0.40 - 4.50 mIU/L    Blood counts are normal Metabolic profile is normal except for elevated blood sugar and minimal elevation of your creatinine-this indicates slight decrease in kidney function, we will continue to monitor  Your cholesterol unfortunately is quite bad. I know you have had difficulty tolerating statins in the past. We do have a couple of newer cholesterol control options for people cannot tolerate statins, they are given by injection at home every 2 weeks. Please let me know if you would be interested in trying 1 of these medications, it may help reduce your risk of heart attack or stroke  Your A1c is quite high, up from 9% at last check Lets have you gradually increase your dose of Lantus insulin. Please check your fasting blood glucose every morning. Increase your insulin by 2 units every 2 days until your fasting sugar is approximately 150 For example, I believe right now you are currently taking 20  units. Assuming your fasting morning sugar is greater than 150, increase to 22 units for 2 days. If sugar is still greater than 150 you would then increase to 24 units for 2 days, and so on Please let me know what questions you may have.  If your fasting blood sugars are not approximately 150 once you are taking 30 to 35 units of insulin please let me know  Otherwise, please see me in about 3 months for repeat A1c

## 2019-09-25 ENCOUNTER — Other Ambulatory Visit: Payer: Self-pay

## 2019-09-25 ENCOUNTER — Encounter: Payer: Self-pay | Admitting: Family Medicine

## 2019-09-25 ENCOUNTER — Ambulatory Visit (INDEPENDENT_AMBULATORY_CARE_PROVIDER_SITE_OTHER): Payer: Medicare Other | Admitting: Family Medicine

## 2019-09-25 VITALS — BP 124/72 | HR 78 | Resp 15 | Ht 60.0 in | Wt 182.0 lb

## 2019-09-25 DIAGNOSIS — R5383 Other fatigue: Secondary | ICD-10-CM | POA: Diagnosis not present

## 2019-09-25 DIAGNOSIS — E785 Hyperlipidemia, unspecified: Secondary | ICD-10-CM

## 2019-09-25 DIAGNOSIS — I639 Cerebral infarction, unspecified: Secondary | ICD-10-CM

## 2019-09-25 DIAGNOSIS — I6302 Cerebral infarction due to thrombosis of basilar artery: Secondary | ICD-10-CM

## 2019-09-25 DIAGNOSIS — E118 Type 2 diabetes mellitus with unspecified complications: Secondary | ICD-10-CM | POA: Diagnosis not present

## 2019-09-25 DIAGNOSIS — I10 Essential (primary) hypertension: Secondary | ICD-10-CM

## 2019-09-25 NOTE — Patient Instructions (Signed)
It was good to see you again today-  I will be in touch with your labs and we can adjust your insulin dosage as needed

## 2019-09-26 ENCOUNTER — Encounter: Payer: Self-pay | Admitting: Family Medicine

## 2019-09-26 LAB — CBC
HCT: 38.7 % (ref 35.0–45.0)
Hemoglobin: 12.8 g/dL (ref 11.7–15.5)
MCH: 29.8 pg (ref 27.0–33.0)
MCHC: 33.1 g/dL (ref 32.0–36.0)
MCV: 90 fL (ref 80.0–100.0)
MPV: 10.2 fL (ref 7.5–12.5)
Platelets: 250 10*3/uL (ref 140–400)
RBC: 4.3 10*6/uL (ref 3.80–5.10)
RDW: 13.1 % (ref 11.0–15.0)
WBC: 5.5 10*3/uL (ref 3.8–10.8)

## 2019-09-26 LAB — COMPREHENSIVE METABOLIC PANEL
AG Ratio: 1.5 (calc) (ref 1.0–2.5)
ALT: 13 U/L (ref 6–29)
AST: 13 U/L (ref 10–35)
Albumin: 4.2 g/dL (ref 3.6–5.1)
Alkaline phosphatase (APISO): 56 U/L (ref 37–153)
BUN/Creatinine Ratio: 17 (calc) (ref 6–22)
BUN: 18 mg/dL (ref 7–25)
CO2: 21 mmol/L (ref 20–32)
Calcium: 9.6 mg/dL (ref 8.6–10.4)
Chloride: 106 mmol/L (ref 98–110)
Creat: 1.09 mg/dL — ABNORMAL HIGH (ref 0.60–0.93)
Globulin: 2.8 g/dL (calc) (ref 1.9–3.7)
Glucose, Bld: 184 mg/dL — ABNORMAL HIGH (ref 65–99)
Potassium: 4.4 mmol/L (ref 3.5–5.3)
Sodium: 140 mmol/L (ref 135–146)
Total Bilirubin: 0.4 mg/dL (ref 0.2–1.2)
Total Protein: 7 g/dL (ref 6.1–8.1)

## 2019-09-26 LAB — LIPID PANEL
Cholesterol: 236 mg/dL — ABNORMAL HIGH (ref <200)
HDL: 39 mg/dL — ABNORMAL LOW (ref >=50)
LDL Cholesterol (Calc): 156 mg/dL (calc) — ABNORMAL HIGH
Non-HDL Cholesterol (Calc): 197 mg/dL (calc) — ABNORMAL HIGH (ref <130)
Total CHOL/HDL Ratio: 6.1 (calc) — ABNORMAL HIGH (ref <5.0)
Triglycerides: 243 mg/dL — ABNORMAL HIGH (ref <150)

## 2019-09-26 LAB — HEMOGLOBIN A1C
Hgb A1c MFr Bld: 11 % of total Hgb — ABNORMAL HIGH (ref <5.7)
Mean Plasma Glucose: 269 (calc)
eAG (mmol/L): 14.9 (calc)

## 2019-09-26 LAB — TSH: TSH: 1.01 mIU/L (ref 0.40–4.50)

## 2019-10-02 ENCOUNTER — Encounter: Payer: Self-pay | Admitting: Family Medicine

## 2019-10-16 ENCOUNTER — Other Ambulatory Visit: Payer: Self-pay | Admitting: Family Medicine

## 2019-10-23 ENCOUNTER — Encounter: Payer: Self-pay | Admitting: Family Medicine

## 2019-10-23 MED ORDER — GLUCOSE BLOOD VI STRP: ORAL_STRIP | 12 refills | Status: DC

## 2019-10-31 ENCOUNTER — Other Ambulatory Visit: Payer: Self-pay | Admitting: Family Medicine

## 2019-11-03 ENCOUNTER — Encounter: Payer: Self-pay | Admitting: Family Medicine

## 2019-11-03 NOTE — Telephone Encounter (Signed)
Patient advised rx was sent to pharmacy.

## 2019-11-04 ENCOUNTER — Encounter: Payer: Self-pay | Admitting: Family Medicine

## 2019-11-04 DIAGNOSIS — H40013 Open angle with borderline findings, low risk, bilateral: Secondary | ICD-10-CM | POA: Diagnosis not present

## 2019-11-04 DIAGNOSIS — E113393 Type 2 diabetes mellitus with moderate nonproliferative diabetic retinopathy without macular edema, bilateral: Secondary | ICD-10-CM | POA: Diagnosis not present

## 2019-11-04 DIAGNOSIS — D3132 Benign neoplasm of left choroid: Secondary | ICD-10-CM | POA: Diagnosis not present

## 2019-11-04 DIAGNOSIS — D23121 Other benign neoplasm of skin of left upper eyelid, including canthus: Secondary | ICD-10-CM | POA: Diagnosis not present

## 2019-11-04 LAB — HM DIABETES EYE EXAM

## 2019-11-05 ENCOUNTER — Encounter: Payer: Self-pay | Admitting: Family Medicine

## 2019-11-11 ENCOUNTER — Other Ambulatory Visit: Payer: Self-pay | Admitting: Family Medicine

## 2019-11-15 NOTE — Patient Instructions (Addendum)
Good to see you again today I wonder if your fatigue is related to using Clonidine for BP- in some people this can be sedating.   Let's taper you off clonidine and replace it with hydrochlorothiazide ( HCTZ - fluid pill) for BP control Continue amlodipine  Take the Clonidine once a day for one week- during this week add 1/2 tablet of HCTZ Then stop the clonidine and increase HCTZ to a full tablet Please see me in 6 weeks to recheck your A1c and check on your progress

## 2019-11-15 NOTE — Progress Notes (Signed)
[place] at [facility] [address] [place], [place] [address] [phone]  Date:  11/17/2019   Name:  Wanda [name]   DOB:  Feb 25, 1940   MRN:  [id number]  PCP:  Darreld Mclean, MD    Chief Complaint: Difficulty Walking (Pt states having trouble with distance walking but if she has cart she can walk distance. Pt states having pain in both thighs and calfs. )   History of Present Illness:  Wanda [name] is a 79 y.o. very pleasant female patient who presents with the following:  Pt here today with concern of difficulty walking Last seen by myself about 6 weeks ago  History of CVA 09/2018, carotid stenosis, diabetes, hypertension, hyperlipidemia She has been evaluated by vascular surgery, status post left endarterectomy in October for carotid stenosis  Mammogram Pneumonia vaccine  From our last visit:  Today with concern of high blood sugars.  She is currently taking 15 to 20 units of Lantus daily as above, likely needs to be increased.  Will obtain an A1c for her today and plan accordingly Blood pressures under good control She notes fatigue, will check thyroid for her today She has mostly recovered from the effects of her stroke, still feels that she is not quite of 100% but she feels fortunate things were not worse  Lab Results  Component Value Date   HGBA1C 11.0 (H) 09/25/2019    Today Wanda [name] notes that she is feeling "so tired all the time."  She is not having any CP or SOB- just feels tired and weak in her body She has noted this for a year or so She notes a runny nose and PND for about 2 weeks-otherwise no symptoms of illness No cough or fever  Her glucose this am was 138  Recent labs on chart including TSH, CBC  BP Readings from Last 3 Encounters:  11/17/19 140/80  09/25/19 124/72  06/24/19 (!) 158/87   Not under any unusual stress She feels like she generally sleeps well- occasional  insomnia Will get up 1-2x to urinate She tends to feel more tired in the am and get better as the day goes on She does not tend to snore her knowledge She has not had a sleep study    Amlodipine 2.5 mg clonidine 0.1 BID Possible angioedema from ace inhibitor in the past  Patient does describe possible cramping in her legs with walking, although this is vague Pt reports having ABI testing about a year ago- it was normal per her recollection, although I cannot find this report in her chart Patient Active Problem List   Diagnosis Date Noted  . Carotid stenosis 11/13/2018  . CVA (cerebral vascular accident) ([place]) 09/19/2018  . Brainstem infarct, acute ([place])   . Primary osteoarthritis of left knee 10/04/2015  . Osteoporosis 08/23/2015  . Multinodular goiter (nontoxic) 04/07/2014  . Controlled type 2 diabetes with retinopathy ([place]) 10/09/2013  . Insulin adverse reaction 09/27/2013  . Senile nuclear sclerosis 08/14/2012  . Essential hypertension 03/10/2011  . Hyperlipidemia with target LDL less than 70 03/10/2011  . Obesity (BMI 30.0-34.9) 03/10/2011    Past Medical History:  Diagnosis Date  . Arthritis   . Carotid stenosis 11/2018  . Cataract    surgery to remove  . Diabetes mellitus    type 2  . Hyperlipidemia   . Hypertension   . Skin cancer    Removed from face  . Stroke (  [place]) 09/19/2018  . Urgency of urination   . Urinary leakage     Past Surgical History:  Procedure Laterality Date  . ABDOMINAL HYSTERECTOMY  early 80's   total  . CAROTID ENDARTERECTOMY Left 11/13/2018  . CARPAL TUNNEL RELEASE Bilateral 1980 and 1981   both hands  . ENDARTERECTOMY Left 11/13/2018   Procedure: Left Carotid Artery Endarterectomy with Patch Angioplasty;  Surgeon: Rosetta Posner, MD;  Location: [facility];  Service: Vascular;  Laterality: Left;  . EYE SURGERY     bilateral cataracts  . Fatty Tumor Excision    . JOINT REPLACEMENT    . TONSILLECTOMY    . TONSILLECTOMY AND ADENOIDECTOMY   age 48  . TOTAL KNEE ARTHROPLASTY Right 10/04/2015  . TOTAL KNEE ARTHROPLASTY Right 10/04/2015   Procedure: TOTAL KNEE ARTHROPLASTY;  Surgeon: Dorna Leitz, MD;  Location: [facility];  Service: Orthopedics;  Laterality: Right;  . TOTAL KNEE ARTHROPLASTY Left 01/28/2016   Procedure: TOTAL KNEE ARTHROPLASTY;  Surgeon: Dorna Leitz, MD;  Location: [facility];  Service: Orthopedics;  Laterality: Left;  . TUBAL LIGATION      Social History   Tobacco Use  . Smoking status: Never Smoker  . Smokeless tobacco: Never Used  Vaping Use  . Vaping Use: Never used  Substance Use Topics  . Alcohol use: Yes    Alcohol/week: 1.0 standard drink    Types: 1 Standard drinks or equivalent per week    Comment: maybe once per month - 3 drinks  . Drug use: No    Family History  Problem Relation Age of Onset  . Pancreatitis Father        deceased 43  . Cancer Other   . Cancer Brother        GI  . Cancer Mother        liver  . Cancer Son        terminal kidney    Allergies  Allergen Reactions  . Ace Inhibitors Diarrhea, Swelling, Other (See Comments) and Cough    Pt had cough and diarrhea with first few doses of medication; also had swelling of right eyelid. Stopped medication on 03/17/11.  . Alendronate Sodium Other (See Comments)    "Made my whole body hurt"  . Aspirin Hives  . Crestor [Rosuvastatin] Other (See Comments)    "Made my whole body hurt"  . Lipitor [Atorvastatin Calcium] Other (See Comments)    "Made my whole body hurt"  . Metformin And Related     Severe abdominal pain  . Shellfish Allergy Nausea Only    Intolerant of fresh shellfish, reports the reaction is GI upset, denies hives, denies any swelling  Reports that she can tolerate canned seafood.     Medication list has been reviewed and updated.  Current Outpatient Medications on File Prior to Visit  Medication Sig Dispense Refill  . amLODipine (NORVASC) 5 MG tablet Take 1 tablet (5 mg total) by mouth daily. (Patient taking  differently: Take 5 mg by mouth daily. Pt taking 2.5 mg daily) 90 tablet 3  . blood glucose meter kit and supplies Dispense based on patient and insurance preference. Pt just needs meter 1 each 0  . Blood Glucose Monitoring Suppl (ONE TOUCH ULTRA MINI) w/Device KIT Use to test blood sugar daily as instructed. Dx: E11.65 1 each 0  . cloNIDine (CATAPRES) 0.1 MG tablet Take 1 tablet (0.1 mg total) by mouth 2 (two) times daily. 60 tablet 0  . clopidogrel (PLAVIX) 75 MG tablet TAKE  1 TABLET(75 MG) BY MOUTH DAILY 90 tablet 1  . glipiZIDE (GLUCOTROL XL) 10 MG 24 hr tablet TAKE 1 TABLET(10 MG) BY MOUTH DAILY WITH BREAKFAST 90 tablet 0  . glucose blood (ONE TOUCH ULTRA TEST) test strip Test blood sugar 3 times a day. Dx code: 250.00 100 each 12  . insulin glargine (LANTUS SOLOSTAR) 100 UNIT/ML Solostar Pen Inject 15-20 Units into the skin daily. 5 pen 11  . Insulin Pen Needle (BD PEN NEEDLE NANO U/F) 32G X 4 MM MISC USE TO INJECT INSULIN ONCE DAILY 100 each 6  . Insulin Syringe-Needle U-100 (INSULIN SYRINGE .5CC/31GX5/16") 31G X 5/16" 0.5 ML MISC Use to inject insulin 1 time daily. 90 each 3  . [DISCONTINUED] lisinopril (PRINIVIL,ZESTRIL) 20 MG tablet Take 20 mg by mouth daily.      No current facility-administered medications on file prior to visit.     Review of Systems:  As per HPI- otherwise negative.   Physical Examination: Vitals:   11/17/19 1022  BP: 140/80  Pulse: 86  Resp: 20  Temp: 97.8 F (36.6 C)  SpO2: 96%   Vitals:   11/17/19 1022  Weight: 180 lb 12.8 oz (82 kg)  Height: 5' (1.524 m)   Body mass index is 35.31 kg/m. Ideal Body Weight: Weight in (lb) to have BMI = 25: 127.7  GEN: no acute distress.  Overweight, looks well and her normal self HEENT: Atraumatic, Normocephalic.  Ears and Nose: No external deformity. CV: RRR, No M/G/R. No JVD. No thrill. No extra heart sounds. PULM: CTA B, no wheezes, crackles, rhonchi. No retractions. No resp. distress. No accessory  muscle use. ABD: S, NT, ND EXTR: No c/c/e PSYCH: Normally interactive. Conversant.    Assessment and Plan: Primary hypertension - Plan: hydrochlorothiazide (HYDRODIURIL) 25 MG tablet  Fatigue, unspecified type  Patient today with concern of unspecified fatigue She denies any chest pain or shortness of breath, possible pain or cramping in legs but this is vague and not the main complaint, she also reports normal ABI testing 1 year ago  It is possible that clonidine is causing her symptoms Will taper off this and replaced with HCTZ She is on 2.5 amlodipine, recalls having side effects with higher dosages in the past  She will let me know her blood pressure response, plan to see her in about 6 weeks for follow-up and A1c recheck This visit occurred during the SARS-CoV-2 public health emergency.  Safety protocols were in place, including screening questions prior to the visit, additional usage of staff PPE, and extensive cleaning of exam room while observing appropriate contact time as indicated for disinfecting solutions.    Signed Lamar Blinks, MD

## 2019-11-17 ENCOUNTER — Other Ambulatory Visit: Payer: Self-pay

## 2019-11-17 ENCOUNTER — Encounter: Payer: Self-pay | Admitting: Family Medicine

## 2019-11-17 ENCOUNTER — Ambulatory Visit (INDEPENDENT_AMBULATORY_CARE_PROVIDER_SITE_OTHER): Payer: Medicare Other | Admitting: Family Medicine

## 2019-11-17 VITALS — BP 140/80 | HR 86 | Temp 97.8°F | Resp 20 | Ht 60.0 in | Wt 180.8 lb

## 2019-11-17 DIAGNOSIS — R5383 Other fatigue: Secondary | ICD-10-CM

## 2019-11-17 DIAGNOSIS — I639 Cerebral infarction, unspecified: Secondary | ICD-10-CM

## 2019-11-17 DIAGNOSIS — I10 Essential (primary) hypertension: Secondary | ICD-10-CM

## 2019-11-17 MED ORDER — HYDROCHLOROTHIAZIDE 25 MG PO TABS: 25.0000 mg | ORAL_TABLET | Freq: Every day | ORAL | 3 refills | Status: DC

## 2019-12-02 DIAGNOSIS — Z23 Encounter for immunization: Secondary | ICD-10-CM | POA: Diagnosis not present

## 2019-12-22 ENCOUNTER — Telehealth: Payer: Self-pay

## 2019-12-22 NOTE — Telephone Encounter (Signed)
Attempted to call pt, LVM for call back, need to switch appt to [facility]

## 2019-12-23 ENCOUNTER — Telehealth: Payer: Medicare Other | Admitting: Adult Health

## 2019-12-23 NOTE — Progress Notes (Deleted)
[facility] [address] [place]. [place] [address] [phone]       STROKE FOLLOW UP NOTE  [name] Date of Birth:  Apr 18, 1940 Medical Record Number:  [id number]   Reason for Referral: stroke follow up  Virtual Visit via Video Note  I connected with Wanda [name] on 12/23/19 at 12:45 PM EST by a video enabled telemedicine application located in office and verified that I am speaking with the correct person using two identifiers who was located at their own home.   Laury Deep, CMA schedule visit to discussed the limitations of evaluation and management by telemedicine and the availability of in person appointments. The patient expressed understanding and agreed to proceed.   CHIEF COMPLAINT:  No chief complaint on file.   HPI:   Today, 12/23/2019, Wanda [name] returns for 73-monthstroke follow-up via MyChart virtual visit.  Reports residual ***.  Denies new stroke/TIA symptoms.  Remains on Plavix without bleeding or bruising. Hx of statin and Zetia intolerance with recent lipid panel showing LDL 156 (09/2019).  Uncontrolled DM with recent A1c 11.0.  Routinely monitored and managed by PCP.     History provided for reference purposes only Update 06/23/2019 JM: Wanda [name] being seen for stroke follow-up.  Residual deficit of right hemisensory impairment with some improvement and typically only noticeable with temperature such as when she is showering. Denies new stroke/TIA symptoms.  Continues on clopidogrel for secondary stroke prevention. Previously on crestor but states unable to tolerate - placed on Zetia but has self discontinued due to difficulty tolerating. Continues to follow with PCP for HTN, HLD and DM management. Plans on f/u in the near future for repeat lab work. No concerns at this time.  Initial visit 02/19/2019 JM: Wanda [name] a 79year old female who is being seen today for hospital follow-up.  Residual deficits of right  hemisensory impairment which has been stable. Denies pain, weakness, numbness or tingling.  She returned back to all prior activities without difficulty. she was evaluated by Dr. EDonnetta Hutchingand felt recent stroke likely due to large vessel disease therefore underwent left carotid endarterectomy on 191/6/6060without complication.  She was advised to follow-up 9 months post procedure for repeat carotid duplex.  Continues on Plavix for secondary stroke.  Discontinued Crestor due to possible statin myalgias.  Recent A1c 9.0.  Continues to follow with PCP for HTN, HLD and DM management.  Denies new or worsening stroke/TIA symptoms.   Stroke admission 09/19/2018: Ms. RAnnmargaret Decapriois a 79y.o. female with history of DB and HTN  who presented on 09/19/2018 with not feeling well, difficulty swallowing.  Evaluated by stroke team and Dr. XErlinda Hongwith stroke work-up revealing left lateral medullary infarct as evidenced on MRI secondary to small or large vessel disease source.  CTA showed bilateral ICA stenosis right 75% and left 70% but they will asymptomatic and recommended follow-up with [facility] outpatient for ongoing monitoring and management.  Initiated Plavix for secondary stroke prevention with history of aspirin.  HTN stable and recommended long-term BP goal 1 30-1 50 given bilateral ICA stenosis.  LDL 158 initiated Crestor 20 mg daily.  History of DM with A1c 8.6 with concerns of noncompliance.  No prior history of stroke.  Discharged home in stable condition on 09/23/2018.        ROS:   14 system review of systems performed and negative with exception of decreased sensation  PMH:  Past Medical History:  Diagnosis Date  .  Arthritis   . Carotid stenosis 11/2018  . Cataract    surgery to remove  . Diabetes mellitus    type 2  . Hyperlipidemia   . Hypertension   . Skin cancer    Removed from face  . Stroke ([place]) 09/19/2018  . Urgency of urination   . Urinary leakage     PSH:  Past Surgical History:   Procedure Laterality Date  . ABDOMINAL HYSTERECTOMY  early 80's   total  . CAROTID ENDARTERECTOMY Left 11/13/2018  . CARPAL TUNNEL RELEASE Bilateral 1980 and 1981   both hands  . ENDARTERECTOMY Left 11/13/2018   Procedure: Left Carotid Artery Endarterectomy with Patch Angioplasty;  Surgeon: Rosetta Posner, MD;  Location: [facility];  Service: Vascular;  Laterality: Left;  . EYE SURGERY     bilateral cataracts  . Fatty Tumor Excision    . JOINT REPLACEMENT    . TONSILLECTOMY    . TONSILLECTOMY AND ADENOIDECTOMY  age 50  . TOTAL KNEE ARTHROPLASTY Right 10/04/2015  . TOTAL KNEE ARTHROPLASTY Right 10/04/2015   Procedure: TOTAL KNEE ARTHROPLASTY;  Surgeon: Dorna Leitz, MD;  Location: [facility];  Service: Orthopedics;  Laterality: Right;  . TOTAL KNEE ARTHROPLASTY Left 01/28/2016   Procedure: TOTAL KNEE ARTHROPLASTY;  Surgeon: Dorna Leitz, MD;  Location: [facility];  Service: Orthopedics;  Laterality: Left;  . TUBAL LIGATION      Social History:  Social History   Socioeconomic History  . Marital status: Widowed    Spouse name: Not on file  . Number of children: Not on file  . Years of education: Not on file  . Highest education level: Not on file  Occupational History  . Not on file  Tobacco Use  . Smoking status: Never Smoker  . Smokeless tobacco: Never Used  Vaping Use  . Vaping Use: Never used  Substance and Sexual Activity  . Alcohol use: Yes    Alcohol/week: 1.0 standard drink    Types: 1 Standard drinks or equivalent per week    Comment: maybe once per month - 3 drinks  . Drug use: No  . Sexual activity: Not Currently    Birth control/protection: Post-menopausal    Comment: Hysterectomy  Other Topics Concern  . Not on file  Social History Narrative   Widowed. Education: [employer]. Exercise: 2 times a week for 30 minutes.   Social Determinants of Health   Financial Resource Strain: Low Risk   . Difficulty of Paying Living Expenses: Not hard at all  Food Insecurity: No Food  Insecurity  . Worried About Charity fundraiser in the Last Year: Never true  . Ran Out of Food in the Last Year: Never true  Transportation Needs: No Transportation Needs  . Lack of Transportation (Medical): No  . Lack of Transportation (Non-Medical): No  Physical Activity: Not on file  Stress: Not on file  Social Connections: Not on file  Intimate Partner Violence: Not on file    Family History:  Family History  Problem Relation Age of Onset  . Pancreatitis Father        deceased 20  . Cancer Other   . Cancer Brother        GI  . Cancer Mother        liver  . Cancer Son        terminal kidney    Medications:   Current Outpatient Medications on File Prior to Visit  Medication Sig Dispense Refill  . amLODipine (NORVASC) 5  MG tablet Take 1 tablet (5 mg total) by mouth daily. (Patient taking differently: Take 5 mg by mouth daily. Pt taking 2.5 mg daily) 90 tablet 3  . blood glucose meter kit and supplies Dispense based on patient and insurance preference. Pt just needs meter 1 each 0  . Blood Glucose Monitoring Suppl (ONE TOUCH ULTRA MINI) w/Device KIT Use to test blood sugar daily as instructed. Dx: E11.65 1 each 0  . cloNIDine (CATAPRES) 0.1 MG tablet Take 1 tablet (0.1 mg total) by mouth 2 (two) times daily. 60 tablet 0  . clopidogrel (PLAVIX) 75 MG tablet TAKE 1 TABLET(75 MG) BY MOUTH DAILY 90 tablet 1  . glipiZIDE (GLUCOTROL XL) 10 MG 24 hr tablet TAKE 1 TABLET(10 MG) BY MOUTH DAILY WITH BREAKFAST 90 tablet 0  . glucose blood (ONE TOUCH ULTRA TEST) test strip Test blood sugar 3 times a day. Dx code: 250.00 100 each 12  . hydrochlorothiazide (HYDRODIURIL) 25 MG tablet Take 1 tablet (25 mg total) by mouth daily. 90 tablet 3  . insulin glargine (LANTUS SOLOSTAR) 100 UNIT/ML Solostar Pen Inject 15-20 Units into the skin daily. 5 pen 11  . Insulin Pen Needle (BD PEN NEEDLE NANO U/F) 32G X 4 MM MISC USE TO INJECT INSULIN ONCE DAILY 100 each 6  . Insulin Syringe-Needle U-100  (INSULIN SYRINGE .5CC/31GX5/16") 31G X 5/16" 0.5 ML MISC Use to inject insulin 1 time daily. 90 each 3  . [DISCONTINUED] lisinopril (PRINIVIL,ZESTRIL) 20 MG tablet Take 20 mg by mouth daily.      No current facility-administered medications on file prior to visit.    Allergies:   Allergies  Allergen Reactions  . Ace Inhibitors Diarrhea, Swelling, Other (See Comments) and Cough    Pt had cough and diarrhea with first few doses of medication; also had swelling of right eyelid. Stopped medication on 03/17/11.  . Alendronate Sodium Other (See Comments)    "Made my whole body hurt"  . Aspirin Hives  . Crestor [Rosuvastatin] Other (See Comments)    "Made my whole body hurt"  . Lipitor [Atorvastatin Calcium] Other (See Comments)    "Made my whole body hurt"  . Metformin And Related     Severe abdominal pain  . Shellfish Allergy Nausea Only    Intolerant of fresh shellfish, reports the reaction is GI upset, denies hives, denies any swelling  Reports that she can tolerate canned seafood.      Physical Exam  There were no vitals filed for this visit. There is no height or weight on file to calculate BMI. No exam data present  General: well developed, well nourished, pleasant elderly Caucasian female, seated, in no evident distress Head: head normocephalic and atraumatic.   Neck: supple with no carotid or supraclavicular bruits Cardiovascular: regular rate and rhythm, no murmurs Musculoskeletal: no deformity Skin:  no rash/petichiae Vascular:  Normal pulses all extremities   Neurologic Exam Mental Status: Awake and fully alert. Fluent speech and language.  Oriented to place and time. Recent and remote memory intact. Attention span, concentration and fund of knowledge appropriate. Mood and affect appropriate.  Cranial Nerves: Pupils equal, briskly reactive to light. Extraocular movements full without nystagmus. Visual fields full to confrontation. Hearing intact. Facial sensation intact.  Face, tongue, palate moves normally and symmetrically.  Motor: Normal bulk and tone. Normal strength in all tested extremity muscles. Sensory.: intact to touch , pinprick , position and vibratory sensation  Coordination: Rapid alternating movements normal in all extremities. Finger-to-nose and heel-to-shin  performed accurately bilaterally. Gait and Station: Arises from chair without difficulty. Stance is normal. Gait demonstrates normal stride length and balance Reflexes: 1+ and symmetric. Toes downgoing.       ASSESSMENT: Reginald Mangels is a 79 y.o. year old female presented with not feeling well and difficulty swallowing on 09/20/2018 with stroke work-up revealing left lateral medullary infarct secondary to large vessel disease and underwent left carotid enterectomy on 11/13/2018 with Dr. Donnetta Hutching. Vascular risk factors include HTN, HLD, DM, bilateral carotid stenosis.     PLAN:  1. Left medullary stroke:   a. Residual deficit of right hemisensory impairment with temperature variation  b. Continue clopidogrel 75 mg daily for secondary stroke prevention.   c. Discussed secondary stroke prevention measures and importance of close PCP follow-up for aggressive stroke risk factor management 2. HTN: BP goal<130/90.  Stable on hydrochlorothiazide, clonidine and amlodipine per PCP 3. HLD: LDL goal <70.  Recent LDL 156 (09/2019).  History of statin intolerance along with intolerance to Zetia.  She plans on repeat lab work in the near future PCP and if LDL>70, would recommend initiation of cholesterol medication management with consideration of PCSK9 inhibitor 4. DMII: A1c goal<7.0.  Recent A1c 11 (09/2019) up from 9.0 (01/2019) on insulin glargine, and glipizide per PCP 5. Carotid stenosis: per Dr. Donnetta Hutching, recommended repeating carotid ultrasound around 09/2019 -order currently placed but has not been scheduled.  Advised to reach out to Dr. Luther Parody office for further assistance.    Follow up in 6  months or call earlier if needed   I spent 35 minutes of face-to-face and non-face-to-face time with patient.  This included previsit chart review, lab review, study review, order entry, electronic health record documentation, patient education    Frann Rider, [facility]  [facility] [address] [place], [place] [address]  Phone [phone] Fax [phone] Note: This document was prepared with digital dictation and possible smart phrase technology. Any transcriptional errors that result from this process are unintentional.

## 2019-12-26 NOTE — Progress Notes (Addendum)
[facility] [address] [place], [place] [address] [phone]  Date:  12/29/2019   Name:  Wanda [name]   DOB:  Jan 20, 1940   MRN:  [id number]  PCP:  Darreld Mclean, MD    Chief Complaint: Follow-up (6 weeks: HTN, DM. Pt says her Glimepiride Tab is smaller than normal is nos not working. Marlana Salvage says she is having some brainfog and fatigue and wonders if she should be concerned about it./)   History of Present Illness:  Wanda [name] is a 79 y.o. very pleasant female patient who presents with the following:  Patient seen today for short-term follow-up visit I last saw her November 8 History of CVA 09/2018, carotid stenosis, diabetes, hypertension, hyperlipidemia She has been evaluated by vascular surgery, status post left endarterectomy in October for carotid stenosis  She tends to have symptoms of generalized malaise and fatigue At her last visit I had her taper off clonidine and start on HCTZ due to fatigue Her BP is under reasonable control but she cannot say if she feels better  She continues to have complaints of just generally feeling bad and tired, it is difficult for her to get more details  She has some reasoning for declining most immunizations which does not make sense to me, but certainly we will respect her decision  Hepatitis C screening COVID-19 booster- done  Tetanus vaccine-declines Mammogram- will order  Flu vaccine-declines Shingles vaccine- pt declines  Pneumonia vaccine- pt declines   Most recent labs on chart from September-diabetes poorly controlled, dyslipidemia at that time  She noted that the current glipizide she is on does not look like the pill she has had in the past.  I tried to find her current pill on my pill look up app, and could not find it.  She states the pharmacy did check her for her and it does indeed appear to be glipizide 10 mg ER.  However, it does not seem  to be working like it has in the past She notes that her glucose is running too high on her current regimen  She took 30 units of insulin last night- typically she is using 20-25 units recently   BP Readings from Last 3 Encounters:  12/29/19 (!) 142/70  11/17/19 140/80  09/25/19 124/72    Lab Results  Component Value Date   HGBA1C 11.0 (H) 09/25/2019    Patient Active Problem List   Diagnosis Date Noted  . Carotid stenosis 11/13/2018  . CVA (cerebral vascular accident) ([place]) 09/19/2018  . Brainstem infarct, acute ([place])   . Primary osteoarthritis of left knee 10/04/2015  . Osteoporosis 08/23/2015  . Multinodular goiter (nontoxic) 04/07/2014  . Controlled type 2 diabetes with retinopathy ([place]) 10/09/2013  . Insulin adverse reaction 09/27/2013  . Senile nuclear sclerosis 08/14/2012  . Essential hypertension 03/10/2011  . Hyperlipidemia with target LDL less than 70 03/10/2011  . Obesity (BMI 30.0-34.9) 03/10/2011    Past Medical History:  Diagnosis Date  . Arthritis   . Carotid stenosis 11/2018  . Cataract    surgery to remove  . Diabetes mellitus    type 2  . Hyperlipidemia   . Hypertension   . Skin cancer    Removed from face  . Stroke ([place]) 09/19/2018  . Urgency of urination   . Urinary leakage     Past Surgical History:  Procedure Laterality Date  . ABDOMINAL HYSTERECTOMY  early 80's  total  . CAROTID ENDARTERECTOMY Left 11/13/2018  . CARPAL TUNNEL RELEASE Bilateral 1980 and 1981   both hands  . ENDARTERECTOMY Left 11/13/2018   Procedure: Left Carotid Artery Endarterectomy with Patch Angioplasty;  Surgeon: Rosetta Posner, MD;  Location: [facility];  Service: Vascular;  Laterality: Left;  . EYE SURGERY     bilateral cataracts  . Fatty Tumor Excision    . JOINT REPLACEMENT    . TONSILLECTOMY    . TONSILLECTOMY AND ADENOIDECTOMY  age 41  . TOTAL KNEE ARTHROPLASTY Right 10/04/2015  . TOTAL KNEE ARTHROPLASTY Right 10/04/2015   Procedure: TOTAL KNEE ARTHROPLASTY;   Surgeon: Dorna Leitz, MD;  Location: [facility];  Service: Orthopedics;  Laterality: Right;  . TOTAL KNEE ARTHROPLASTY Left 01/28/2016   Procedure: TOTAL KNEE ARTHROPLASTY;  Surgeon: Dorna Leitz, MD;  Location: [facility];  Service: Orthopedics;  Laterality: Left;  . TUBAL LIGATION      Social History   Tobacco Use  . Smoking status: Never Smoker  . Smokeless tobacco: Never Used  Vaping Use  . Vaping Use: Never used  Substance Use Topics  . Alcohol use: Yes    Alcohol/week: 1.0 standard drink    Types: 1 Standard drinks or equivalent per week    Comment: maybe once per month - 3 drinks  . Drug use: No    Family History  Problem Relation Age of Onset  . Pancreatitis Father        deceased 2  . Cancer Other   . Cancer Brother        GI  . Cancer Mother        liver  . Cancer Son        terminal kidney    Allergies  Allergen Reactions  . Ace Inhibitors Diarrhea, Swelling, Other (See Comments) and Cough    Pt had cough and diarrhea with first few doses of medication; also had swelling of right eyelid. Stopped medication on 03/17/11.  . Alendronate Sodium Other (See Comments)    "Made my whole body hurt"  . Aspirin Hives  . Crestor [Rosuvastatin] Other (See Comments)    "Made my whole body hurt"  . Lipitor [Atorvastatin Calcium] Other (See Comments)    "Made my whole body hurt"  . Metformin And Related     Severe abdominal pain  . Shellfish Allergy Nausea Only    Intolerant of fresh shellfish, reports the reaction is GI upset, denies hives, denies any swelling  Reports that she can tolerate canned seafood.     Medication list has been reviewed and updated.  Current Outpatient Medications on File Prior to Visit  Medication Sig Dispense Refill  . amLODipine (NORVASC) 5 MG tablet Take 1 tablet (5 mg total) by mouth daily. (Patient taking differently: Take 5 mg by mouth daily. Pt taking 2.5 mg daily) 90 tablet 3  . blood glucose meter kit and supplies Dispense based on patient and  insurance preference. Pt just needs meter 1 each 0  . Blood Glucose Monitoring Suppl (ONE TOUCH ULTRA MINI) w/Device KIT Use to test blood sugar daily as instructed. Dx: E11.65 1 each 0  . cloNIDine (CATAPRES) 0.1 MG tablet Take 1 tablet (0.1 mg total) by mouth 2 (two) times daily. 60 tablet 0  . clopidogrel (PLAVIX) 75 MG tablet TAKE 1 TABLET(75 MG) BY MOUTH DAILY 90 tablet 1  . glipiZIDE (GLUCOTROL XL) 10 MG 24 hr tablet TAKE 1 TABLET(10 MG) BY MOUTH DAILY WITH BREAKFAST 90 tablet 0  .  glucose blood (ONE TOUCH ULTRA TEST) test strip Test blood sugar 3 times a day. Dx code: 250.00 100 each 12  . hydrochlorothiazide (HYDRODIURIL) 25 MG tablet Take 1 tablet (25 mg total) by mouth daily. 90 tablet 3  . insulin glargine (LANTUS SOLOSTAR) 100 UNIT/ML Solostar Pen Inject 15-20 Units into the skin daily. 5 pen 11  . Insulin Pen Needle (BD PEN NEEDLE NANO U/F) 32G X 4 MM MISC USE TO INJECT INSULIN ONCE DAILY 100 each 6  . Insulin Syringe-Needle U-100 (INSULIN SYRINGE .5CC/31GX5/16") 31G X 5/16" 0.5 ML MISC Use to inject insulin 1 time daily. 90 each 3  . [DISCONTINUED] lisinopril (PRINIVIL,ZESTRIL) 20 MG tablet Take 20 mg by mouth daily.      No current facility-administered medications on file prior to visit.    Review of Systems:  As per HPI- otherwise negative.   Physical Examination: Vitals:   12/29/19 1320  BP: (!) 142/70  Pulse: 97  Resp: 17  SpO2: 98%   Vitals:   12/29/19 1320  Weight: 180 lb 12.8 oz (82 kg)   Body mass index is 35.31 kg/m. Ideal Body Weight:    GEN: no acute distress.  Overweight, otherwise looks well HEENT: Atraumatic, Normocephalic.  Ears and Nose: No external deformity. CV: RRR, No M/G/R. No JVD. No thrill. No extra heart sounds. PULM: CTA B, no wheezes, crackles, rhonchi. No retractions. No resp. distress. No accessory muscle use. ABD: S, NT, ND EXTR: No c/c/e PSYCH: Normally interactive. Conversant.    Assessment and Plan: Controlled type 2  diabetes mellitus with retinopathy of both eyes, with long-term current use of insulin, macular edema presence unspecified, unspecified retinopathy severity ([place]) - Plan: dapagliflozin propanediol (FARXIGA) 10 MG TABS tablet, Basic metabolic panel, Hemoglobin A1c  Primary hypertension  Fatigue, unspecified type  Cerebrovascular accident (CVA) due to thrombosis of basilar artery ([place])  Hyperlipidemia with target LDL less than 70  Patient here today with concern of malaise and suspicion that her blood sugar is not well controlled She is taking once a day insulin and also glimepiride 10 mg-see above, her current supply of glimepiride seems different than previous pills  We will check A1c and basic metabolic today, will have her stop glimepiride and change to [place]  Blood pressures under reasonable control  I went over recommended immunizations, she declines most of them today though she did thankfully get her Covid booster She has dyslipidemia, not on a statin due to intolerance History of CVA, she has had a carotid endarterectomy just over 1 year ago.  She is seeing neurology for follow-up next month  This visit occurred during the SARS-CoV-2 public health emergency.  Safety protocols were in place, including screening questions prior to the visit, additional usage of staff PPE, and extensive cleaning of exam room while observing appropriate contact time as indicated for disinfecting solutions.    Signed Lamar Blinks, MD  Addendum 12/22, received her labs as below-message to patient  Results for orders placed or performed in visit on 96/78/93  Basic metabolic panel  Result Value Ref Range   Sodium 135 135 - 145 mEq/L   Potassium 3.9 3.5 - 5.1 mEq/L   Chloride 98 96 - 112 mEq/L   CO2 27 19 - 32 mEq/L   Glucose, Bld 277 (H) 70 - 99 mg/dL   BUN 20 6 - 23 mg/dL   Creatinine, Ser 1.04 0.40 - 1.20 mg/dL   GFR 50.96 (L) >60.00 mL/min   Calcium 10.2 8.4 - 10.5  mg/dL  Hemoglobin  A1c  Result Value Ref Range   Hgb A1c MFr Bld 11.3 (H) 4.6 - 6.5 %

## 2019-12-29 ENCOUNTER — Other Ambulatory Visit: Payer: Self-pay

## 2019-12-29 ENCOUNTER — Ambulatory Visit (INDEPENDENT_AMBULATORY_CARE_PROVIDER_SITE_OTHER): Payer: Medicare Other | Admitting: Family Medicine

## 2019-12-29 VITALS — BP 142/70 | HR 97 | Resp 17 | Wt 180.8 lb

## 2019-12-29 DIAGNOSIS — I10 Essential (primary) hypertension: Secondary | ICD-10-CM

## 2019-12-29 DIAGNOSIS — Z23 Encounter for immunization: Secondary | ICD-10-CM | POA: Diagnosis not present

## 2019-12-29 DIAGNOSIS — R5383 Other fatigue: Secondary | ICD-10-CM | POA: Diagnosis not present

## 2019-12-29 DIAGNOSIS — E785 Hyperlipidemia, unspecified: Secondary | ICD-10-CM | POA: Diagnosis not present

## 2019-12-29 DIAGNOSIS — I639 Cerebral infarction, unspecified: Secondary | ICD-10-CM

## 2019-12-29 DIAGNOSIS — Z794 Long term (current) use of insulin: Secondary | ICD-10-CM | POA: Diagnosis not present

## 2019-12-29 DIAGNOSIS — E11319 Type 2 diabetes mellitus with unspecified diabetic retinopathy without macular edema: Secondary | ICD-10-CM

## 2019-12-29 DIAGNOSIS — I6302 Cerebral infarction due to thrombosis of basilar artery: Secondary | ICD-10-CM

## 2019-12-29 MED ORDER — DAPAGLIFLOZIN PROPANEDIOL 10 MG PO TABS: 10.0000 mg | ORAL_TABLET | Freq: Every day | ORAL | 5 refills | Status: DC

## 2019-12-29 NOTE — Patient Instructions (Signed)
Good to see you again today- I am sorry you are not feeling that well We will stop your glimepiride since it does not seem to be working Please start Wilder Glade - take a 1/2 tablet for one week, then increase to a whole tablet assuming you tolerate well  I will be in touch with your labs I am hoping that if we get your blood sugars under better control you will feel better overall

## 2019-12-30 ENCOUNTER — Encounter: Payer: Self-pay | Admitting: Family Medicine

## 2019-12-30 LAB — BASIC METABOLIC PANEL
BUN: 20 mg/dL (ref 6–23)
CO2: 27 mEq/L (ref 19–32)
Calcium: 10.2 mg/dL (ref 8.4–10.5)
Chloride: 98 mEq/L (ref 96–112)
Creatinine, Ser: 1.04 mg/dL (ref 0.40–1.20)
GFR: 50.96 mL/min — ABNORMAL LOW (ref >60.00)
Glucose, Bld: 277 mg/dL — ABNORMAL HIGH (ref 70–99)
Potassium: 3.9 mEq/L (ref 3.5–5.1)
Sodium: 135 mEq/L (ref 135–145)

## 2019-12-30 LAB — HEMOGLOBIN A1C: Hgb A1c MFr Bld: 11.3 % — ABNORMAL HIGH (ref 4.6–6.5)

## 2019-12-30 MED ORDER — GLIPIZIDE ER 5 MG PO TB24: ORAL_TABLET | ORAL | 6 refills | Status: DC

## 2019-12-31 ENCOUNTER — Encounter: Payer: Self-pay | Admitting: Family Medicine

## 2020-01-22 ENCOUNTER — Encounter: Payer: Self-pay | Admitting: Adult Health

## 2020-01-22 ENCOUNTER — Ambulatory Visit (INDEPENDENT_AMBULATORY_CARE_PROVIDER_SITE_OTHER): Payer: Medicare Other | Admitting: Adult Health

## 2020-01-22 VITALS — BP 150/76 | HR 86 | Ht 61.0 in | Wt 180.4 lb

## 2020-01-22 DIAGNOSIS — E119 Type 2 diabetes mellitus without complications: Secondary | ICD-10-CM

## 2020-01-22 DIAGNOSIS — Z794 Long term (current) use of insulin: Secondary | ICD-10-CM | POA: Diagnosis not present

## 2020-01-22 DIAGNOSIS — I639 Cerebral infarction, unspecified: Secondary | ICD-10-CM

## 2020-01-22 DIAGNOSIS — I10 Essential (primary) hypertension: Secondary | ICD-10-CM

## 2020-01-22 DIAGNOSIS — I6523 Occlusion and stenosis of bilateral carotid arteries: Secondary | ICD-10-CM | POA: Diagnosis not present

## 2020-01-22 DIAGNOSIS — E785 Hyperlipidemia, unspecified: Secondary | ICD-10-CM

## 2020-01-22 NOTE — Progress Notes (Signed)
[facility] [address] [place] [address] [phone]       STROKE FOLLOW UP NOTE  [name] Date of Birth:  18-Sep-1940 Medical Record Number:  [id number]   Reason for Referral: stroke follow up  Virtual Visit via Video Note  I connected with Wanda [name] on 01/22/20 at  1:15 PM EST by a video enabled telemedicine application located in office and verified that I am speaking with the correct person using two identifiers who was located at their own home.   Wanda [name], CMA schedule visit to discussed the limitations of evaluation and management by telemedicine and the availability of in person appointments. The patient expressed understanding and agreed to proceed.   CHIEF COMPLAINT:  Chief Complaint  Patient presents with  . Follow-up    RM 14 alone Pt is well, can't feel tempeture, such as heat/water, on R side    HPI:   Today, 01/22/2020, Wanda [name] returns for a prolonged 80-monthstroke follow-up.  Overall stable from stroke standpoint and reports residual right temperature sensation impairment.  Denies weakness, numbness/tingling or pain.  Denies new stroke/TIA symptoms.  Remains on Plavix without bleeding or bruising. Hx of statin and Zetia intolerance with recent lipid panel showing LDL 156 (09/2019).  She is not currently on any type of cholesterol regimen.  Uncontrolled DM with recent A1c 11.3.  Recent diabetic regimen changes by PCP with improvement of glucose levels per patient but still fluctuate. Blood pressure today 150/76 and monitors at home which has been stable.  No concerns at this time.   History provided for reference purposes only Update 06/23/2019 Wanda [name]: Wanda [name] being seen for stroke follow-up.  Residual deficit of right hemisensory impairment with some improvement and typically only noticeable with temperature such as when she is showering. Denies new stroke/TIA symptoms.  Continues on clopidogrel for  secondary stroke prevention. Previously on crestor but states unable to tolerate - placed on Zetia but has self discontinued due to difficulty tolerating. Continues to follow with PCP for HTN, HLD and DM management. Plans on f/u in the near future for repeat lab work. No concerns at this time.  Initial visit 02/19/2019 Wanda [name]: Wanda [name] a 80year old [name] who is being seen today for hospital follow-up.  Residual deficits of right hemisensory impairment which has been stable. Denies pain, weakness, numbness or tingling.  She returned back to all prior activities without difficulty. she was evaluated by Dr. EDonnetta Hutchingand felt recent stroke likely due to large vessel disease therefore underwent left carotid endarterectomy on 176/1/6073without complication.  She was advised to follow-up 9 months post procedure for repeat carotid duplex.  Continues on Plavix for secondary stroke.  Discontinued Crestor due to possible statin myalgias.  Recent A1c 9.0.  Continues to follow with PCP for HTN, HLD and DM management.  Denies new or worsening stroke/TIA symptoms.   Stroke admission 09/19/2018: Ms. Wanda Sparkmanis a 80y.o. [name] with history of DB and HTN  who presented on 09/19/2018 with not feeling well, difficulty swallowing.  Evaluated by stroke team and Dr. XErlinda Hongwith stroke work-up revealing left lateral medullary infarct as evidenced on MRI secondary to small or large vessel disease source.  CTA showed bilateral ICA stenosis right 75% and left 70% but they will asymptomatic and recommended follow-up with [facility] outpatient for ongoing monitoring and management.  Initiated Plavix for secondary stroke prevention with history of aspirin.  HTN stable and recommended long-term BP goal  1 30-1 50 given bilateral ICA stenosis.  LDL 158 initiated Crestor 20 mg daily.  History of DM with A1c 8.6 with concerns of noncompliance.  No prior history of stroke.  Discharged home in stable condition on 09/23/2018.     ROS:   14 system  review of systems performed and negative with exception of decreased sensation  PMH:  Past Medical History:  Diagnosis Date  . Arthritis   . Carotid stenosis 11/2018  . Cataract    surgery to remove  . Diabetes mellitus    type 2  . Hyperlipidemia   . Hypertension   . Skin cancer    Removed from face  . Stroke ([place]) 09/19/2018  . Urgency of urination   . Urinary leakage     PSH:  Past Surgical History:  Procedure Laterality Date  . ABDOMINAL HYSTERECTOMY  early 80's   total  . CAROTID ENDARTERECTOMY Left 11/13/2018  . CARPAL TUNNEL RELEASE Bilateral 1980 and 1981   both hands  . ENDARTERECTOMY Left 11/13/2018   Procedure: Left Carotid Artery Endarterectomy with Patch Angioplasty;  Surgeon: Wanda Posner, MD;  Location: [facility];  Service: Vascular;  Laterality: Left;  . EYE SURGERY     bilateral cataracts  . Fatty Tumor Excision    . JOINT REPLACEMENT    . TONSILLECTOMY    . TONSILLECTOMY AND ADENOIDECTOMY  age 3  . TOTAL KNEE ARTHROPLASTY Right 10/04/2015  . TOTAL KNEE ARTHROPLASTY Right 10/04/2015   Procedure: TOTAL KNEE ARTHROPLASTY;  Surgeon: Dorna Leitz, MD;  Location: [facility];  Service: Orthopedics;  Laterality: Right;  . TOTAL KNEE ARTHROPLASTY Left 01/28/2016   Procedure: TOTAL KNEE ARTHROPLASTY;  Surgeon: Dorna Leitz, MD;  Location: [facility];  Service: Orthopedics;  Laterality: Left;  . TUBAL LIGATION      Social History:  Social History   Socioeconomic History  . Marital status: Widowed    Spouse name: Not on file  . Number of children: Not on file  . Years of education: Not on file  . Highest education level: Not on file  Occupational History  . Not on file  Tobacco Use  . Smoking status: Never Smoker  . Smokeless tobacco: Never Used  Vaping Use  . Vaping Use: Never used  Substance and Sexual Activity  . Alcohol use: Yes    Alcohol/week: 1.0 standard drink    Types: 1 Standard drinks or equivalent per week    Comment: maybe once per month - 3 drinks  .  Drug use: No  . Sexual activity: Not Currently    Birth control/protection: Post-menopausal    Comment: Hysterectomy  Other Topics Concern  . Not on file  Social History Narrative   Widowed. Education: [employer]. Exercise: 2 times a week for 30 minutes.   Social Determinants of Health   Financial Resource Strain: Low Risk   . Difficulty of Paying Living Expenses: Not hard at all  Food Insecurity: No Food Insecurity  . Worried About Charity fundraiser in the Last Year: Never true  . Ran Out of Food in the Last Year: Never true  Transportation Needs: No Transportation Needs  . Lack of Transportation (Medical): No  . Lack of Transportation (Non-Medical): No  Physical Activity: Not on file  Stress: Not on file  Social Connections: Not on file  Intimate Partner Violence: Not on file    Family History:  Family History  Problem Relation Age of Onset  . Pancreatitis Father  deceased 10  . Cancer Other   . Cancer Brother        GI  . Cancer Mother        liver  . Cancer Son        terminal kidney    Medications:   Current Outpatient Medications on File Prior to Visit  Medication Sig Dispense Refill  . amLODipine (NORVASC) 5 MG tablet Take 1 tablet (5 mg total) by mouth daily. (Patient taking differently: Take 5 mg by mouth daily. Pt taking 2.5 mg daily) 90 tablet 3  . blood glucose meter kit and supplies Dispense based on patient and insurance preference. Pt just needs meter 1 each 0  . Blood Glucose Monitoring Suppl (ONE TOUCH ULTRA MINI) w/Device KIT Use to test blood sugar daily as instructed. Dx: E11.65 1 each 0  . cloNIDine (CATAPRES) 0.1 MG tablet Take 1 tablet (0.1 mg total) by mouth 2 (two) times daily. 60 tablet 0  . clopidogrel (PLAVIX) 75 MG tablet TAKE 1 TABLET(75 MG) BY MOUTH DAILY 90 tablet 1  . glipiZIDE (GLUCOTROL XL) 5 MG 24 hr tablet Take 2 am and 1 pm 90 tablet 6  . glucose blood (ONE TOUCH ULTRA TEST) test strip Test blood sugar 3 times a day. Dx code:  250.00 100 each 12  . hydrochlorothiazide (HYDRODIURIL) 25 MG tablet Take 1 tablet (25 mg total) by mouth daily. 90 tablet 3  . insulin glargine (LANTUS SOLOSTAR) 100 UNIT/ML Solostar Pen Inject 15-20 Units into the skin daily. 5 pen 11  . Insulin Pen Needle (BD PEN NEEDLE NANO U/F) 32G X 4 MM MISC USE TO INJECT INSULIN ONCE DAILY 100 each 6  . Insulin Syringe-Needle U-100 (INSULIN SYRINGE .5CC/31GX5/16") 31G X 5/16" 0.5 ML MISC Use to inject insulin 1 time daily. 90 each 3  . [DISCONTINUED] lisinopril (PRINIVIL,ZESTRIL) 20 MG tablet Take 20 mg by mouth daily.      No current facility-administered medications on file prior to visit.    Allergies:   Allergies  Allergen Reactions  . Ace Inhibitors Diarrhea, Swelling, Other (See Comments) and Cough    Pt had cough and diarrhea with first few doses of medication; also had swelling of right eyelid. Stopped medication on 03/17/11.  . Alendronate Sodium Other (See Comments)    "Made my whole body hurt"  . Aspirin Hives  . Crestor [Rosuvastatin] Other (See Comments)    "Made my whole body hurt"  . Lipitor [Atorvastatin Calcium] Other (See Comments)    "Made my whole body hurt"  . Metformin And Related     Severe abdominal pain  . Shellfish Allergy Nausea Only    Intolerant of fresh shellfish, reports the reaction is GI upset, denies hives, denies any swelling  Reports that she can tolerate canned seafood.      Physical Exam  Vitals:   01/22/20 1303  BP: (!) 150/76  Pulse: 86  Weight: 180 lb 6.4 oz (81.8 kg)  Height: 5' 1"  (1.549 m)   Body mass index is 34.09 kg/m. No exam data present  General: well developed, well nourished, pleasant elderly Caucasian [name], seated, in no evident distress Head: head normocephalic and atraumatic.   Neck: supple with no carotid or supraclavicular bruits Cardiovascular: regular rate and rhythm, no murmurs Musculoskeletal: no deformity Skin:  no rash/petichiae Vascular:  Normal pulses all  extremities   Neurologic Exam Mental Status: Awake and fully alert. Fluent speech and language.  Oriented to place and time. Recent and remote memory  intact. Attention span, concentration and fund of knowledge appropriate. Mood and affect appropriate.  Cranial Nerves: Pupils equal, briskly reactive to light. Extraocular movements full without nystagmus. Visual fields full to confrontation. Hearing intact. Facial sensation intact. Face, tongue, palate moves normally and symmetrically.  Motor: Normal bulk and tone. Normal strength in all tested extremity muscles. Sensory.: intact to touch , pinprick , position and vibratory sensation  Coordination: Rapid alternating movements normal in all extremities. Finger-to-nose and heel-to-shin performed accurately bilaterally. Gait and Station: Arises from chair without difficulty. Stance is normal. Gait demonstrates normal stride length and balance without use of assistive device Reflexes: 1+ and symmetric. Toes downgoing.       ASSESSMENT: Wanda [name] with stroke work-up revealing left lateral medullary infarct secondary to large vessel disease and underwent left carotid enterectomy on 11/13/2018 with Dr. Donnetta [name]. Vascular risk factors include HTN, HLD, DM, bilateral carotid stenosis.     PLAN:  1. Left medullary stroke:   a. Residual deficit of right sided sensory impairment to temperature b. Continue clopidogrel 75 mg daily for secondary stroke prevention.   c. Discussed importance of adequate cholesterol control such as with PCSK9 inhibitor with history of statin and Zetia intolerance for secondary stroke prevention.  She is extremely hesitant to try any additional cholesterol medications due to sensitivities and intolerances previously.  Discussed increased risk of stroke especially in setting of carotid stenosis with uncontrolled cholesterol  levels which she verbalized understanding.  She declines interest in initiating at this time but was provided additional information to further consider and may discuss further with PCP if interested in pursuing d. Discussed secondary stroke prevention measures and importance of close PCP follow-up for aggressive stroke risk factor management 2. HTN: BP goal<130/90.  Stable on hydrochlorothiazide, clonidine and amlodipine per PCP 3. HLD: LDL goal <70.  Recent LDL 156 (09/2019).  See #1c 4. DMII: A1c goal<7.0.  Recent A1c 11.3 (09/2019) up from 9.0 (01/2019) with recent insulin adjustments per PCP 5. Carotid stenosis: s/p L CEA 11/2018. Per Dr. Donnetta [name], recommended repeating carotid ultrasound around 09/2019 -order currently placed but has not been scheduled.  Advised to reach out to Dr. Luther Parody office to ensure scheduling of ultrasound    Overall stable from stroke standpoint and recommend follow-up as needed basis   CC:  [facility] provider: Dr. Sinda Du, Gay Filler, MD    I spent 30 minutes of face-to-face and non-face-to-face time with patient.  This included previsit chart review, lab review, study review, order entry, electronic health record documentation, patient education and discussion regarding history of prior stroke, importance of managing stroke risk factors including uncontrolled cholesterol, indication for surveillance of carotid stenosis and answered all other questions to patient's satisfaction    Wanda [name], [facility]  [facility] [address] [place], [place] [address]  Phone [phone] Fax [phone] Note: This document was prepared with digital dictation and possible smart phrase technology. Any transcriptional errors that result from this process are unintentional.

## 2020-01-22 NOTE — Patient Instructions (Signed)
Continue clopidogrel 75 mg daily for secondary stroke prevention  Would highly recommend trialing a new form of cholesterol medication called Repatha or Praluent which is an twice monthly injectable PCSK9 inhibitor -additional information provided to further consider.  You may speak further regarding this medication with your PCP  Continue to follow up with PCP regarding cholesterol, blood pressure and diabetes management  Maintain strict control of hypertension with blood pressure goal below 130/90, diabetes with hemoglobin A1c goal below 7.0% and cholesterol with LDL cholesterol (bad cholesterol) goal below 70 mg/dL.          Thank you for coming to see [place] at [facility]. I hope we have been able to provide you high quality care today.  You may receive a patient satisfaction survey over the next few weeks. We would appreciate your feedback and comments so that we may continue to improve ourselves and the health of our patients.   Evolocumab injection What is this medicine? EVOLOCUMAB (e voe LOK ue mab) treats high cholesterol. It is used with lifestyle changes, like diet and exercise. It is used alone or with other medicines. This medicine may be used for other purposes; ask your health care provider or pharmacist if you have questions. COMMON BRAND NAME(S): Repatha What should I tell my health care provider before I take this medicine? They need to know if you have any of these conditions:  an unusual or allergic reaction to evolocumab, other medicines, latex, foods, dyes, or preservatives  pregnant or trying to get pregnant  breast-feeding How should I use this medicine? This medicine is injected under the skin. You will be taught how to prepare and give it. Take it as directed on the prescription label at the same time every day. Keep taking it unless your health care provider tells you to stop. It is important that you put your used needles and syringes in a  special sharps container. Do not put them in a trash can. If you do not have a sharps container, call your pharmacist or health care provider to get one. This medicine comes with INSTRUCTIONS FOR USE. Ask your pharmacist for directions on how to use this medicine. Read the information carefully. Talk to your pharmacist or health care provider if you have questions. Talk to your health care provider about the use of this medicine in children. While it may be prescribed for children as young as 10 for selected conditions, precautions do apply. Overdosage: If you think you have taken too much of this medicine contact a poison control center or emergency room at once. NOTE: This medicine is only for you. Do not share this medicine with others. What if I miss a dose? It is important not to miss any doses. Talk to your health care provider about what to do if you miss a dose. What may interact with this medicine? Interactions are not expected. This list may not describe all possible interactions. Give your health care provider a list of all the medicines, herbs, non-prescription drugs, or dietary supplements you use. Also tell them if you smoke, drink alcohol, or use illegal drugs. Some items may interact with your medicine. What should I watch for while using this medicine? Visit your health care provider for regular checks on your progress. Tell your health care provider if your symptoms do not start to get better or if they get worse. You may need blood work while you are taking this drug. Do not wear the on-body infuser  during an MRI. Taking this drug is only part of a total heart healthy program. Your health care provider may give you a special diet to follow. Avoid alcohol. Avoid smoking. Ask your health care provider how much you should exercise. What side effects may I notice from receiving this medicine? Side effects that you should report to your doctor or health care provider as soon as  possible:  allergic reactions (skin rash, itching or hives; swelling of the face, lips, or tongue)  high blood sugar (increased hunger, thirst, or urination; unusually weak or tired, blurry vision)  infection (fever, chills, cough, sore throat, pain, or trouble passing urine) Side effects that usually do not require medical attention (report to your doctor or health care provider if they continue or are bothersome):  diarrhea  muscle pain  nausea  pain, redness, or irritation at site where injected This list may not describe all possible side effects. Call your doctor for medical advice about side effects. You may report side effects to FDA at 1-800-FDA-1088. Where should I keep my medicine? Keep out of the reach of children and pets. Store in a refrigerator or at room temperature between 20 and 25 degrees C (68 and 77 degrees F). Refrigeration (preferred): Store it in the refrigerator. Do not freeze. Keep it in the original carton until you are ready to take it. Remove the dose from the carton about 30 minutes before it is time for you to take it. Throw away any unused medicine after the expiration date. Room temperature: This medicine may be stored at room temperature for up to 30 days. Keep it in the original carton until you are ready to take it. If it is stored at room temperature, throw away any unused medicine after 30 days or after it expires, whichever is first. Protect from light. Do not shake. Avoid exposure to extreme heat. To get rid of medicines that are no longer needed or have expired:  Take the medicine to a medicine take-back program. Check with your pharmacy or law enforcement to find a location.  If you cannot return the medicine, ask your pharmacist or health care provider how to get rid of this medicine safely. NOTE: This sheet is a summary. It may not cover all possible information. If you have questions about this medicine, talk to your doctor, pharmacist, or  health care provider.  2021 Elsevier/Gold Standard (2018-12-18 12:26:14)

## 2020-01-22 NOTE — Progress Notes (Signed)
I agree with the above plan 

## 2020-01-25 ENCOUNTER — Other Ambulatory Visit: Payer: Self-pay | Admitting: Family Medicine

## 2020-01-25 DIAGNOSIS — I10 Essential (primary) hypertension: Secondary | ICD-10-CM

## 2020-01-26 MED ORDER — GLIPIZIDE ER 5 MG PO TB24: ORAL_TABLET | ORAL | 1 refills | Status: DC

## 2020-02-02 ENCOUNTER — Other Ambulatory Visit: Payer: Self-pay | Admitting: Family Medicine

## 2020-02-03 ENCOUNTER — Telehealth: Payer: Self-pay | Admitting: Family Medicine

## 2020-02-03 MED ORDER — CLOPIDOGREL BISULFATE 75 MG PO TABS: ORAL_TABLET | ORAL | 1 refills | Status: DC

## 2020-02-03 NOTE — Telephone Encounter (Signed)
Medication: clopidogrel (PLAVIX) 75 MG tablet   Has the patient contacted their pharmacy? Yes  (If no, request that the patient contact the pharmacy for the refill.) (If yes, when and what did the pharmacy advise?)  Preferred Pharmacy (with phone number or street name): [id number] - [place], [place] - [address]  [address] [place], [place]  [address]  Phone:  [phone] Fax:  [phone]  DEA #:  [id number]  Agent: Please be advised that RX refills may take up to 3 business days. We ask that you follow-up with your pharmacy.

## 2020-02-03 NOTE — Telephone Encounter (Signed)
Medication refilled

## 2020-03-09 NOTE — Progress Notes (Addendum)
Wanda [name] at [facility] [address], [place], [place] [address] [phone]  Date:  03/11/2020   Name:  Wanda [name]   DOB:  February 23, 1940   MRN:  [id number]  PCP:  Darreld Mclean, MD    Chief Complaint: Diabetes (3 month follow up/)   History of Present Illness:  Wanda [name] is a 80 y.o. very pleasant female patient who presents with the following:  Periodic follow-up visit today Pt last seen by myself in December  History of CVA9/2020, carotid stenosis, diabetes, hypertension, hyperlipidemia She has been evaluated by vascular surgery, status post left endarterectomy in October for carotid stenosis  Lab Results  Component Value Date   HGBA1C 11.3 (H) 12/29/2019   At last visit her A1c was quite high We increased her dose of glipizide to 10 am and 5 pm SLGT2 were too expensive -she is not able to use this medication under her current insurance plan  She tries to stick with her diet but admits she does not comply perfectly She does check her glucose at home some times -may vary between the low 120s to around 200  She notes that she is feeling fine overall Her exercise is limited by her OA and CVA history  She does better if she has something to lean on like a grocery cart   BP Readings from Last 3 Encounters:  03/11/20 120/70  01/22/20 (!) 150/76  12/29/19 (!) 142/70    Pt does have sx of claudication She had ABI about 2 years ago- abnl on the right She notes sx are able the same, does not wish to see vascular about any particular tx at this time She would be interested in physical therapy to work on general strength and balance  Patient Active Problem List   Diagnosis Date Noted  . Carotid stenosis 11/13/2018  . CVA (cerebral vascular accident) ([place]) 09/19/2018  . Brainstem infarct, acute ([place])   . Primary osteoarthritis of left knee 10/04/2015  . Osteoporosis 08/23/2015  . Multinodular goiter  (nontoxic) 04/07/2014  . Controlled type 2 diabetes with retinopathy ([place]) 10/09/2013  . Insulin adverse reaction 09/27/2013  . Senile nuclear sclerosis 08/14/2012  . Essential hypertension 03/10/2011  . Hyperlipidemia with target LDL less than 70 03/10/2011  . Obesity (BMI 30.0-34.9) 03/10/2011    Past Medical History:  Diagnosis Date  . Arthritis   . Carotid stenosis 11/2018  . Cataract    surgery to remove  . Diabetes mellitus    type 2  . Hyperlipidemia   . Hypertension   . Skin cancer    Removed from face  . Stroke ([place]) 09/19/2018  . Urgency of urination   . Urinary leakage     Past Surgical History:  Procedure Laterality Date  . ABDOMINAL HYSTERECTOMY  early 80's   total  . CAROTID ENDARTERECTOMY Left 11/13/2018  . CARPAL TUNNEL RELEASE Bilateral 1980 and 1981   both hands  . ENDARTERECTOMY Left 11/13/2018   Procedure: Left Carotid Artery Endarterectomy with Patch Angioplasty;  Surgeon: Rosetta Posner, MD;  Location: [facility];  Service: Vascular;  Laterality: Left;  . EYE SURGERY     bilateral cataracts  . Fatty Tumor Excision    . JOINT REPLACEMENT    . TONSILLECTOMY    . TONSILLECTOMY AND ADENOIDECTOMY  age 39  . TOTAL KNEE ARTHROPLASTY Right 10/04/2015  . TOTAL KNEE ARTHROPLASTY Right 10/04/2015   Procedure: TOTAL KNEE ARTHROPLASTY;  Surgeon: Dorna Leitz, MD;  Location: [facility];  Service: Orthopedics;  Laterality: Right;  . TOTAL KNEE ARTHROPLASTY Left 01/28/2016   Procedure: TOTAL KNEE ARTHROPLASTY;  Surgeon: Dorna Leitz, MD;  Location: [facility];  Service: Orthopedics;  Laterality: Left;  . TUBAL LIGATION      Social History   Tobacco Use  . Smoking status: Never Smoker  . Smokeless tobacco: Never Used  Vaping Use  . Vaping Use: Never used  Substance Use Topics  . Alcohol use: Yes    Alcohol/week: 1.0 standard drink    Types: 1 Standard drinks or equivalent per week    Comment: maybe once per month - 3 drinks  . Drug use: No    Family History  Problem  Relation Age of Onset  . Pancreatitis Father        deceased 80  . Cancer Other   . Cancer Brother        GI  . Cancer Mother        liver  . Cancer Son        terminal kidney    Allergies  Allergen Reactions  . Ace Inhibitors Diarrhea, Swelling, Other (See Comments) and Cough    Pt had cough and diarrhea with first few doses of medication; also had swelling of right eyelid. Stopped medication on 03/17/11.  . Alendronate Sodium Other (See Comments)    "Made my whole body hurt"  . Aspirin Hives  . Crestor [Rosuvastatin] Other (See Comments)    "Made my whole body hurt"  . Lipitor [Atorvastatin Calcium] Other (See Comments)    "Made my whole body hurt"  . Metformin And Related     Severe abdominal pain  . Shellfish Allergy Nausea Only    Intolerant of fresh shellfish, reports the reaction is GI upset, denies hives, denies any swelling  Reports that she can tolerate canned seafood.     Medication list has been reviewed and updated.  Current Outpatient Medications on File Prior to Visit  Medication Sig Dispense Refill  . amLODipine (NORVASC) 5 MG tablet Take 1 tablet (5 mg total) by mouth daily. 90 tablet 1  . blood glucose meter kit and supplies Dispense based on patient and insurance preference. Pt just needs meter 1 each 0  . Blood Glucose Monitoring Suppl (ONE TOUCH ULTRA MINI) w/Device KIT Use to test blood sugar daily as instructed. Dx: E11.65 1 each 0  . cloNIDine (CATAPRES) 0.1 MG tablet Take 1 tablet (0.1 mg total) by mouth 2 (two) times daily. 60 tablet 0  . clopidogrel (PLAVIX) 75 MG tablet TAKE 1 TABLET(75 MG) BY MOUTH DAILY 90 tablet 1  . glipiZIDE (GLUCOTROL XL) 5 MG 24 hr tablet Take 2 tablets by mouth every morning and 1 tablet by mouth every evening 270 tablet 1  . glucose blood (ONE TOUCH ULTRA TEST) test strip Test blood sugar 3 times a day. Dx code: 250.00 100 each 12  . hydrochlorothiazide (HYDRODIURIL) 25 MG tablet Take 1 tablet (25 mg total) by mouth daily.  90 tablet 3  . insulin glargine (LANTUS SOLOSTAR) 100 UNIT/ML Solostar Pen Inject 15-20 Units into the skin daily. 5 pen 11  . Insulin Pen Needle (BD PEN NEEDLE NANO U/F) 32G X 4 MM MISC USE TO INJECT INSULIN ONCE DAILY 100 each 6  . Insulin Syringe-Needle U-100 (INSULIN SYRINGE .5CC/31GX5/16") 31G X 5/16" 0.5 ML MISC Use to inject insulin 1 time daily. 90 each 3  . [DISCONTINUED] lisinopril (PRINIVIL,ZESTRIL) 20 MG tablet  Take 20 mg by mouth daily.      No current facility-administered medications on file prior to visit.    Review of Systems:  As per HPI- otherwise negative.   Physical Examination: Vitals:   03/11/20 1303 03/11/20 1312  BP: (!) 160/84 120/70  Pulse: 88   Resp: 17   Temp: (!) 97.4 F (36.3 C)   SpO2: 98%    Vitals:   03/11/20 1303  Weight: 181 lb (82.1 kg)  Height: 5' 1"  (1.549 m)   Body mass index is 34.2 kg/m. Ideal Body Weight: Weight in (lb) to have BMI = 25: 132  GEN: no acute distress.  Elderly woman, obese.  Looks well HEENT: Atraumatic, Normocephalic.  Ears and Nose: No external deformity. CV: RRR, No M/G/R. No JVD. No thrill. No extra heart sounds. PULM: CTA B, no wheezes, crackles, rhonchi. No retractions. No resp. distress. No accessory muscle use. EXTR: No c/c/e PSYCH: Normally interactive. Conversant.  Cannot palpate pulse right foot Left normal  All sensation normal Assessment and Plan: Gait instability - Plan: Ambulatory referral to Physical Therapy  Intermittent claudication (HCC)  Essential hypertension  Cerebrovascular accident (CVA) due to thrombosis of basilar artery ([place])  Controlled type 2 diabetes mellitus with retinopathy of both eyes, with long-term current use of insulin, macular edema presence unspecified, unspecified retinopathy severity ([place]) - Plan: Hemoglobin A1c  Immunization due  Patient today for follow-up visit.  We will check her A1c, hopefully will see improvement. She does have claudication and some gait  instability.  She is interested in doing physical therapy and I have placed referral.  At this time she is not interested in seeing vascular surgery as she does not wish to have any intervention for arterial disease which is reasonable at her age Offered flu vaccine and pneumonia vaccine, patient declines Blood pressure okay on recheck Will plan further follow- up pending labs.  This visit occurred during the SARS-CoV-2 public health emergency.  Safety protocols were in place, including screening questions prior to the visit, additional usage of staff PPE, and extensive cleaning of exam room while observing appropriate contact time as indicated for disinfecting solutions.    Signed Lamar Blinks, MD   Received her lab report 3/4- message to pt  Results for orders placed or performed in visit on 03/11/20  Hemoglobin A1c  Result Value Ref Range   Hgb A1c MFr Bld 11.1 (H) 4.6 - 6.5 %

## 2020-03-10 ENCOUNTER — Ambulatory Visit: Payer: Medicare Other | Admitting: *Deleted

## 2020-03-11 ENCOUNTER — Encounter: Payer: Self-pay | Admitting: Family Medicine

## 2020-03-11 ENCOUNTER — Other Ambulatory Visit: Payer: Self-pay

## 2020-03-11 ENCOUNTER — Ambulatory Visit (INDEPENDENT_AMBULATORY_CARE_PROVIDER_SITE_OTHER): Payer: Medicare Other | Admitting: Family Medicine

## 2020-03-11 VITALS — BP 120/70 | HR 88 | Temp 97.4°F | Resp 17 | Ht 61.0 in | Wt 181.0 lb

## 2020-03-11 DIAGNOSIS — R2681 Unsteadiness on feet: Secondary | ICD-10-CM | POA: Diagnosis not present

## 2020-03-11 DIAGNOSIS — I10 Essential (primary) hypertension: Secondary | ICD-10-CM

## 2020-03-11 DIAGNOSIS — I739 Peripheral vascular disease, unspecified: Secondary | ICD-10-CM

## 2020-03-11 DIAGNOSIS — I639 Cerebral infarction, unspecified: Secondary | ICD-10-CM | POA: Diagnosis not present

## 2020-03-11 DIAGNOSIS — I6302 Cerebral infarction due to thrombosis of basilar artery: Secondary | ICD-10-CM | POA: Diagnosis not present

## 2020-03-11 DIAGNOSIS — E11319 Type 2 diabetes mellitus with unspecified diabetic retinopathy without macular edema: Secondary | ICD-10-CM

## 2020-03-11 DIAGNOSIS — Z794 Long term (current) use of insulin: Secondary | ICD-10-CM

## 2020-03-11 DIAGNOSIS — Z23 Encounter for immunization: Secondary | ICD-10-CM

## 2020-03-11 LAB — HEMOGLOBIN A1C: Hgb A1c MFr Bld: 11.1 % — ABNORMAL HIGH (ref 4.6–6.5)

## 2020-03-11 NOTE — Patient Instructions (Signed)
It was good to see you again today I will be in touch with your A1c Please schedule your mammogram Consider getting a tetanus booster at your pharmacy BP looked ok on recheck!  We will put in a PT referral for you to work on exercise and balance  Likely next visit in 4- 6 months

## 2020-03-12 ENCOUNTER — Encounter: Payer: Self-pay | Admitting: Family Medicine

## 2020-03-18 ENCOUNTER — Other Ambulatory Visit: Payer: Self-pay

## 2020-03-18 ENCOUNTER — Ambulatory Visit (INDEPENDENT_AMBULATORY_CARE_PROVIDER_SITE_OTHER): Payer: Medicare Other

## 2020-03-18 VITALS — BP 150/90 | HR 80 | Temp 97.9°F | Resp 16 | Ht 61.0 in | Wt 180.2 lb

## 2020-03-18 DIAGNOSIS — Z1231 Encounter for screening mammogram for malignant neoplasm of breast: Secondary | ICD-10-CM | POA: Diagnosis not present

## 2020-03-18 DIAGNOSIS — Z0000 Encounter for general adult medical examination without abnormal findings: Secondary | ICD-10-CM | POA: Diagnosis not present

## 2020-03-18 NOTE — Progress Notes (Signed)
Subjective:   Kaleya Douse is a 80 y.o. female who presents for Medicare Annual (Subsequent) preventive examination.  Review of Systems     Cardiac Risk Factors include: advanced age (>36mn, >>48women);diabetes mellitus;dyslipidemia;hypertension;obesity (BMI >30kg/m2);sedentary lifestyle     Objective:    Today's Vitals   03/18/20 1239 03/18/20 1244  BP: (!) 150/90   Pulse: 80   Resp: 16   Temp: 97.9 F (36.6 C)   TempSrc: Oral   SpO2: 99%   Weight: 180 lb 3.2 oz (81.7 kg)   Height: 5' 1"  (1.549 m)   PainSc:  2    Body mass index is 34.05 kg/m.  Advanced Directives 03/18/2020 03/10/2019 11/14/2018 11/06/2018 10/29/2018 09/20/2018 09/19/2018  Does Patient Have a Medical Advance Directive? No No No No No - No  Does patient want to make changes to medical advance directive? - No - Patient declined - - - - -  Would patient like information on creating a medical advance directive? No - Patient declined - No - Patient declined No - Patient declined No - Patient declined No - Patient declined -    Current Medications (verified) Outpatient Encounter Medications as of 03/18/2020  Medication Sig  . amLODipine (NORVASC) 5 MG tablet Take 1 tablet (5 mg total) by mouth daily.  . blood glucose meter kit and supplies Dispense based on patient and insurance preference. Pt just needs meter  . Blood Glucose Monitoring Suppl (ONE TOUCH ULTRA MINI) w/Device KIT Use to test blood sugar daily as instructed. Dx: E11.65  . cloNIDine (CATAPRES) 0.1 MG tablet Take 1 tablet (0.1 mg total) by mouth 2 (two) times daily.  . clopidogrel (PLAVIX) 75 MG tablet TAKE 1 TABLET(75 MG) BY MOUTH DAILY  . glipiZIDE (GLUCOTROL XL) 5 MG 24 hr tablet Take 2 tablets by mouth every morning and 1 tablet by mouth every evening  . glucose blood (ONE TOUCH ULTRA TEST) test strip Test blood sugar 3 times a day. Dx code: 250.00  . hydrochlorothiazide (HYDRODIURIL) 25 MG tablet Take 1 tablet (25 mg total) by mouth daily.   . insulin glargine (LANTUS SOLOSTAR) 100 UNIT/ML Solostar Pen Inject 15-20 Units into the skin daily.  . Insulin Pen Needle (BD PEN NEEDLE NANO U/F) 32G X 4 MM MISC USE TO INJECT INSULIN ONCE DAILY  . Insulin Syringe-Needle U-100 (INSULIN SYRINGE .5CC/31GX5/16") 31G X 5/16" 0.5 ML MISC Use to inject insulin 1 time daily.  . [DISCONTINUED] lisinopril (PRINIVIL,ZESTRIL) 20 MG tablet Take 20 mg by mouth daily.    No facility-administered encounter medications on file as of 03/18/2020.    Allergies (verified) Ace inhibitors, Alendronate sodium, Aspirin, Crestor [rosuvastatin], Lipitor [atorvastatin calcium], Metformin and related, and Shellfish allergy   History: Past Medical History:  Diagnosis Date  . Arthritis   . Carotid stenosis 11/2018  . Cataract    surgery to remove  . Diabetes mellitus    type 2  . Hyperlipidemia   . Hypertension   . Skin cancer    Removed from face  . [place] ([place] 09/19/[place]  . Urgency of urination   . Urinary leakage    Past Surgical History:  Procedure Laterality Date  . ABDOMINAL HYSTERECTOMY  early 80's   total  . CAROTID ENDARTERECTOMY Left 11/13/2018  . CARPAL TUNNEL RELEASE Bilateral 1980 and 1981   both hands  . ENDARTERECTOMY Left 11/13/2018   Procedure: Left Carotid Artery Endarterectomy with Patch Angioplasty;  Surgeon: ERosetta Posner MD;  [place] [facility]  Service:  Vascular;  Laterality: Left;  . EYE SURGERY     bilateral cataracts  . Fatty Tumor Excision    . JOINT REPLACEMENT    . TONSILLECTOMY    . TONSILLECTOMY AND ADENOIDECTOMY  age 72  . TOTAL KNEE ARTHROPLASTY Right 10/04/2015  . TOTAL KNEE ARTHROPLASTY Right 10/04/2015   Procedure: TOTAL KNEE ARTHROPLASTY;  Surgeon: Dorna Leitz, MD;  Location: [facility];  Service: Orthopedics;  Laterality: Right;  . TOTAL KNEE ARTHROPLASTY Left 01/28/2016   Procedure: TOTAL KNEE ARTHROPLASTY;  Surgeon: Dorna Leitz, MD;  Location: [facility];  Service: Orthopedics;  Laterality: Left;  . TUBAL LIGATION      Family History  Problem Relation Age of Onset  . Pancreatitis Father        deceased 19  . Cancer Other   . Cancer Brother        GI  . Cancer Mother        liver  . Cancer Son        terminal kidney   Social History   Socioeconomic History  . Marital status: Widowed    Spouse name: Not on file  . Number of children: Not on file  . Years of education: Not on file  . Highest education level: Not on file  Occupational History  . Not on file  Tobacco Use  . Smoking status: Never Smoker  . Smokeless tobacco: Never Used  Vaping Use  . Vaping Use: Never used  Substance and Sexual Activity  . Alcohol use: Yes    Alcohol/week: 1.0 standard drink    Types: 1 Standard drinks or equivalent per week    Comment: maybe once per month - 3 drinks  . Drug use: No  . Sexual activity: Not Currently    Birth control/protection: Post-menopausal    Comment: Hysterectomy  Other Topics Concern  . Not on file  Social History Narrative   Widowed. Education: [employer]. Exercise: 2 times a week for 30 minutes.   Social Determinants of Health   Financial Resource Strain: Low Risk   . Difficulty of Paying Living Expenses: Not hard at all  Food Insecurity: No Food Insecurity  . Worried About Charity fundraiser in the Last Year: Never true  . Ran Out of Food in the Last Year: Never true  Transportation Needs: No Transportation Needs  . Lack of Transportation (Medical): No  . Lack of Transportation (Non-Medical): No  Physical Activity: Inactive  . Days of Exercise per Week: 0 days  . Minutes of Exercise per Session: 0 min  Stress: No Stress Concern Present  . Feeling of Stress : Not at all  Social Connections: Socially Isolated  . Frequency of Communication with Friends and Family: More than three times a week  . Frequency of Social Gatherings with Friends and Family: More than three times a week  . Attends Religious Services: Never  . Active Member of Clubs or Organizations: No  .  Attends Archivist Meetings: Never  . Marital Status: Widowed    Tobacco Counseling Counseling given: Not Answered   Clinical Intake:  Pre-visit preparation completed: Yes  Pain : 0-10 Pain Score: 2  Pain Type: Chronic pain Pain Location: Knee Pain Orientation: Left,Right Pain Onset: More than a month ago Pain Frequency: Constant     Nutritional Status: BMI > 30  Obese Nutritional Risks: None Diabetes: Yes CBG done?: No Did pt. bring in CBG monitor from home?: No  How often do you need to have  someone help you when you read instructions, pamphlets, or other written materials from your doctor or pharmacy?: 1 - Never  Diabetes:  Is the patient diabetic?  Yes  If diabetic, was a CBG obtained today?  No  Did the patient bring in their glucometer from home?  No  How often do you monitor your CBG's? Twice daily  Financial Strains and Diabetes Management:  Are you having any financial strains with the device, your supplies or your medication? No .  Does the patient want to be seen by Chronic Care Management for management of their diabetes?  No  Would the patient like to be referred to a Nutritionist or for Diabetic Management?  No   Diabetic Exams:  Diabetic Eye Exam: Completed 11/04/2019.   Diabetic Foot Exam: Pt has been advised about the importance in completing this exam. To be completed by PCP     Interpreter Needed?: No  Information entered by :: Caroleen Hamman LPN   Activities of Daily Living In your present state of health, do you have any difficulty performing the following activities: 03/18/2020  Hearing? N  Vision? N  Difficulty concentrating or making decisions? N  Walking or climbing stairs? N  Dressing or bathing? N  Doing errands, shopping? N  Preparing Food and eating ? N  Using the Toilet? N  In the past six months, have you accidently leaked urine? Y  Do you have problems with loss of bowel control? N  Managing your Medications?  N  Managing your Finances? N  Housekeeping or managing your Housekeeping? N  Some recent data might be hidden    Patient Care Team: Copland, Gay Filler, MD as PCP - General (Family Medicine) Berenice Primas, MD as Referring Physician (Orthopedic Surgery)  Indicate any recent Medical Services you may have received from other than [facility] providers in the past year (date may be approximate).     Assessment:   This is a routine wellness examination for Dorothy.  Hearing/Vision screen  Hearing Screening   125Hz  250Hz  500Hz  1000Hz  2000Hz  3000Hz  4000Hz  6000Hz  8000Hz   Right ear:           Left ear:           Comments: No issues  Vision Screening Comments: Wears glasses Last eye exam-10/2019-Dr. Landry Mellow  Dietary issues and exercise activities discussed: Current Exercise Habits: The patient does not participate in regular exercise at present, Exercise limited by: orthopedic condition(s) (knee pain)  Goals    . Increase physical activity    . Patient Stated     I want to make it to 89      Depression Screen PHQ 2/9 Scores 03/18/2020 03/10/2019 03/04/2018 03/01/2017 10/18/2015 10/07/2014 09/26/2013  PHQ - 2 Score 0 0 0 0 0 0 0    Fall Risk Fall Risk  03/18/2020 03/11/2020 03/10/2019 03/04/2018 03/01/2017  Falls in the past year? - 0 0 0 No  Comment - - - - -  Number falls in past yr: 0 0 0 - -  Injury with Fall? 0 0 0 - -  Follow up Falls prevention discussed - Education provided;Falls prevention discussed - -    FALL RISK PREVENTION PERTAINING TO THE HOME:  Any stairs in or around the home? No  Home free of loose throw rugs in walkways, pet beds, electrical cords, etc? Yes  Adequate lighting in your home to reduce risk of falls? Yes   ASSISTIVE DEVICES UTILIZED TO PREVENT FALLS:  Life alert? No  Use  of a cane, walker or w/c? No  Grab bars in the bathroom? Yes  Shower chair or bench in shower? No  Elevated toilet seat or a handicapped toilet? No   TIMED UP AND GO:  Was the test  performed? Yes .  Length of time to ambulate 10 feet: 11 sec.   Gait slow and steady without use of assistive device  Cognitive Function:Normal cognitive status assessed by direct observation by this Nurse Health Advisor. No abnormalities found.   MMSE - Mini Mental State Exam 03/04/2018 03/01/2017  Orientation to time 5 5  Orientation to Place 5 5  Registration 3 3  Attention/ Calculation 5 5  Recall 3 3  Language- name 2 objects 2 2  Language- repeat 1 1  Language- follow 3 step command 3 3  Language- read & follow direction 1 1  Write a sentence 1 1  Copy design 1 1  Total score 30 30     6CIT Screen 03/18/2020  What Year? 0 points  What month? 0 points  What time? 0 points  Count back from 20 0 points  Months in reverse 0 points  Repeat phrase 0 points  Total Score 0    Immunizations Immunization History  Administered Date(s) Administered  . Moderna Sars-Covid-2 Vaccination 04/15/2019, 05/13/2019, 12/02/2019    TDAP status: Due, Education has been provided regarding the importance of this vaccine. Advised may receive this vaccine at local pharmacy or Health Dept. Aware to provide a copy of the vaccination record if obtained from local pharmacy or Health Dept. Verbalized acceptance and understanding.  Flu Vaccine status: Due, Education has been provided regarding the importance of this vaccine. Advised may receive this vaccine at local pharmacy or Health Dept. Aware to provide a copy of the vaccination record if obtained from local pharmacy or Health Dept. Verbalized acceptance and understanding.  Pneumococcal vaccine status: Due, Education has been provided regarding the importance of this vaccine. Advised may receive this vaccine at local pharmacy or Health Dept. Aware to provide a copy of the vaccination record if obtained from local pharmacy or Health Dept. Verbalized acceptance and understanding.  Covid-19 vaccine status: Completed vaccines  Qualifies for Shingles  Vaccine? Yes   Zostavax completed No   Shingrix Completed?: No.    Education has been provided regarding the importance of this vaccine. Patient has been advised to call insurance company to determine out of pocket expense if they have not yet received this vaccine. Advised may also receive vaccine at local pharmacy or Health Dept. Verbalized acceptance and understanding.  Screening Tests Health Maintenance  Topic Date Due  . TETANUS/TDAP  01/09/2014  . MAMMOGRAM  09/11/2019  . FOOT EXAM  02/05/2020  . PNA vac Low Risk Adult (1 of 2 - PCV13) 03/11/2021 (Originally 01/11/2005)  . HEMOGLOBIN A1C  09/11/2020  . OPHTHALMOLOGY EXAM  11/03/2020  . DEXA SCAN  Completed  . COVID-19 Vaccine  Completed  . HPV VACCINES  Aged Out  . INFLUENZA VACCINE  Discontinued    Health Maintenance  Health Maintenance Due  Topic Date Due  . TETANUS/TDAP  01/09/2014  . MAMMOGRAM  09/11/2019  . FOOT EXAM  02/05/2020    Colorectal cancer screening: No longer required.   Mammogram status: Ordered today. Pt provided with contact info and advised to call to schedule appt.   Bone Density status:Due-Declined  Lung Cancer Screening: (Low Dose CT Chest recommended if Age 83-80 years, 30 pack-year currently smoking OR have quit w/in 15years.) does  not qualify.     Additional Screening:  Hepatitis C Screening: does not qualify  Vision Screening: Recommended annual ophthalmology exams for early detection of glaucoma and other disorders of the eye. Is the patient up to date with their annual eye exam?  Yes  Who is the provider or what is the name of the office in which the patient attends annual eye exams? Dr. Landry Mellow   Dental Screening: Recommended annual dental exams for proper oral hygiene  Community Resource Referral / Chronic Care Management: CRR required this visit?  No   CCM required this visit?  No      Plan:     I have personally reviewed and noted the following in the patient's chart:    . Medical and social history . Use of alcohol, tobacco or illicit drugs  . Current medications and supplements . Functional ability and status . Nutritional status . Physical activity . Advanced directives . List of other physicians . Hospitalizations, surgeries, and ER visits in previous 12 months . Vitals . Screenings to include cognitive, depression, and falls . Referrals and appointments  In addition, I have reviewed and discussed with patient certain preventive protocols, quality metrics, and best practice recommendations. A written personalized care plan for preventive services as well as general preventive health recommendations were provided to patient.   Patient would like to access avs on mychart.  Marta Antu, LPN   05/13/6977  Nurse Health Advisor  Nurse Notes: None

## 2020-03-18 NOTE — Patient Instructions (Signed)
Wanda [name] , Thank you for taking time to come for your Medicare Wellness Visit. I appreciate your ongoing commitment to your health goals. Please review the following plan we discussed and let me know if I can assist you in the future.   Screening recommendations/referrals: Colonoscopy: No longer required Mammogram: Ordered today. Someone will be calling you to schedule. Bone Density: Due-Declined. Recommended yearly ophthalmology/optometry visit for glaucoma screening and checkup Recommended yearly dental visit for hygiene and checkup  Vaccinations: Influenza vaccine: Declined Pneumococcal vaccines: Declined Tdap vaccine: Declined Shingles vaccine:Declined   Covid-19:Completed vaccines  Advanced directives: Declined information today  Conditions/risks identified: See problem list  Next appointment: Follow up in one year for your annual wellness visit 03/24/2020 @ 12:40   Preventive Care 65 Years and Older, Female Preventive care refers to lifestyle choices and visits with your health care provider that can promote health and wellness. What does preventive care include?  A yearly physical exam. This is also called an annual well check.  Dental exams once or twice a year.  Routine eye exams. Ask your health care provider how often you should have your eyes checked.  Personal lifestyle choices, including:  Daily care of your teeth and gums.  Regular physical activity.  Eating a healthy diet.  Avoiding tobacco and drug use.  Limiting alcohol use.  Practicing safe sex.  Taking low-dose aspirin every day.  Taking vitamin and mineral supplements as recommended by your health care provider. What happens during an annual well check? The services and screenings done by your health care provider during your annual well check will depend on your age, overall health, lifestyle risk factors, and family history of disease. Counseling  Your health care provider may ask you  questions about your:  Alcohol use.  Tobacco use.  Drug use.  Emotional well-being.  Home and relationship well-being.  Sexual activity.  Eating habits.  History of falls.  Memory and ability to understand (cognition).  Work and work Statistician.  Reproductive health. Screening  You may have the following tests or measurements:  Height, weight, and BMI.  Blood pressure.  Lipid and cholesterol levels. These may be checked every 5 years, or more frequently if you are over 58 years old.  Skin check.  Lung cancer screening. You may have this screening every year starting at age 35 if you have a 30-pack-year history of smoking and currently smoke or have quit within the past 15 years.  Fecal occult blood test (FOBT) of the stool. You may have this test every year starting at age 73.  Flexible sigmoidoscopy or colonoscopy. You may have a sigmoidoscopy every 5 years or a colonoscopy every 10 years starting at age 42.  Hepatitis C blood test.  Hepatitis B blood test.  Sexually transmitted disease (STD) testing.  Diabetes screening. This is done by checking your blood sugar (glucose) after you have not eaten for a while (fasting). You may have this done every 1-3 years.  Bone density scan. This is done to screen for osteoporosis. You may have this done starting at age 77.  Mammogram. This may be done every 1-2 years. Talk to your health care provider about how often you should have regular mammograms. Talk with your health care provider about your test results, treatment options, and if necessary, the need for more tests. Vaccines  Your health care provider may recommend certain vaccines, such as:  Influenza vaccine. This is recommended every year.  Tetanus, diphtheria, and acellular pertussis (Tdap, Td)  vaccine. You may need a Td booster every 10 years.  Zoster vaccine. You may need this after age 57.  Pneumococcal 13-valent conjugate (PCV13) vaccine. One dose is  recommended after age 61.  Pneumococcal polysaccharide (PPSV23) vaccine. One dose is recommended after age 8. Talk to your health care provider about which screenings and vaccines you need and how often you need them. This information is not intended to replace advice given to you by your health care provider. Make sure you discuss any questions you have with your health care provider. Document Released: 01/22/2015 Document Revised: 09/15/2015 Document Reviewed: 10/27/2014 Elsevier Interactive Patient Education  2017 [place] Prevention in the Home Falls can cause injuries. They can happen to people of all ages. There are many things you can do to make your home safe and to help prevent falls. What can I do on the outside of my home?  Regularly fix the edges of walkways and driveways and fix any cracks.  Remove anything that might make you trip as you walk through a door, such as a raised step or threshold.  Trim any bushes or trees on the path to your home.  Use bright outdoor lighting.  Clear any walking paths of anything that might make someone trip, such as rocks or tools.  Regularly check to see if handrails are loose or broken. Make sure that both sides of any steps have handrails.  Any raised decks and porches should have guardrails on the edges.  Have any leaves, snow, or ice cleared regularly.  Use sand or salt on walking paths during winter.  Clean up any spills in your garage right away. This includes oil or grease spills. What can I do in the bathroom?  Use night lights.  Install grab bars by the toilet and in the tub and shower. Do not use towel bars as grab bars.  Use non-skid mats or decals in the tub or shower.  If you need to sit down in the shower, use a plastic, non-slip stool.  Keep the floor dry. Clean up any water that spills on the floor as soon as it happens.  Remove soap buildup in the tub or shower regularly.  Attach bath mats  securely with double-sided non-slip rug tape.  Do not have throw rugs and other things on the floor that can make you trip. What can I do in the bedroom?  Use night lights.  Make sure that you have a light by your bed that is easy to reach.  Do not use any sheets or blankets that are too big for your bed. They should not hang down onto the floor.  Have a firm chair that has side arms. You can use this for support while you get dressed.  Do not have throw rugs and other things on the floor that can make you trip. What can I do in the kitchen?  Clean up any spills right away.  Avoid walking on wet floors.  Keep items that you use a lot in easy-to-reach places.  If you need to reach something above you, use a strong step stool that has a grab bar.  Keep electrical cords out of the way.  Do not use floor polish or wax that makes floors slippery. If you must use wax, use non-skid floor wax.  Do not have throw rugs and other things on the floor that can make you trip. What can I do with my stairs?  Do not leave  any items on the stairs.  Make sure that there are handrails on both sides of the stairs and use them. Fix handrails that are broken or loose. Make sure that handrails are as long as the stairways.  Check any carpeting to make sure that it is firmly attached to the stairs. Fix any carpet that is loose or worn.  Avoid having throw rugs at the top or bottom of the stairs. If you do have throw rugs, attach them to the floor with carpet tape.  Make sure that you have a light switch at the top of the stairs and the bottom of the stairs. If you do not have them, ask someone to add them for you. What else can I do to help prevent falls?  Wear shoes that:  Do not have high heels.  Have rubber bottoms.  Are comfortable and fit you well.  Are closed at the toe. Do not wear sandals.  If you use a stepladder:  Make sure that it is fully opened. Do not climb a closed  stepladder.  Make sure that both sides of the stepladder are locked into place.  Ask someone to hold it for you, if possible.  Clearly mark and make sure that you can see:  Any grab bars or handrails.  First and last steps.  Where the edge of each step is.  Use tools that help you move around (mobility aids) if they are needed. These include:  Canes.  Walkers.  Scooters.  Crutches.  Turn on the lights when you go into a dark area. Replace any light bulbs as soon as they burn out.  Set up your furniture so you have a clear path. Avoid moving your furniture around.  If any of your floors are uneven, fix them.  If there are any pets around you, be aware of where they are.  Review your medicines with your doctor. Some medicines can make you feel dizzy. This can increase your chance of falling. Ask your doctor what other things that you can do to help prevent falls. This information is not intended to replace advice given to you by your health care provider. Make sure you discuss any questions you have with your health care provider. Document Released: 10/22/2008 Document Revised: 06/03/2015 Document Reviewed: 01/30/2014 Elsevier Interactive Patient Education  2017 [employer].

## 2020-03-22 ENCOUNTER — Encounter (HOSPITAL_BASED_OUTPATIENT_CLINIC_OR_DEPARTMENT_OTHER): Payer: Self-pay

## 2020-03-22 ENCOUNTER — Ambulatory Visit (HOSPITAL_BASED_OUTPATIENT_CLINIC_OR_DEPARTMENT_OTHER)
Admission: RE | Admit: 2020-03-22 | Discharge: 2020-03-22 | Disposition: A | Discharge status: Home / Self Care | Payer: Medicare Other | Source: Ambulatory Visit | Attending: Family Medicine | Admitting: Family Medicine

## 2020-03-22 ENCOUNTER — Other Ambulatory Visit: Payer: Self-pay

## 2020-03-22 DIAGNOSIS — Z1231 Encounter for screening mammogram for malignant neoplasm of breast: Secondary | ICD-10-CM | POA: Insufficient documentation

## 2020-03-29 ENCOUNTER — Encounter: Payer: Self-pay | Admitting: Physical Therapy

## 2020-03-29 ENCOUNTER — Ambulatory Visit: Payer: Medicare Other | Attending: Family Medicine | Admitting: Physical Therapy

## 2020-03-29 ENCOUNTER — Other Ambulatory Visit: Payer: Self-pay

## 2020-03-29 DIAGNOSIS — M6281 Muscle weakness (generalized): Secondary | ICD-10-CM | POA: Insufficient documentation

## 2020-03-29 DIAGNOSIS — R2681 Unsteadiness on feet: Secondary | ICD-10-CM | POA: Insufficient documentation

## 2020-03-29 NOTE — Therapy (Signed)
[facility] [address]  [place], [place], [address] Phone: [phone]   Fax:  [phone]  Physical Therapy Evaluation  Patient Details  Name: Wanda [name] MRN: [id number] Date of Birth: 1940-12-13 Referring Provider (PT): Lamar Blinks, MD   Encounter Date: 03/29/2020   PT End of Session - 03/29/20 1629    Visit Number 1    Number of Visits 16    Date for PT Re-Evaluation 05/24/20    Authorization Type Medicare    Progress Note Due on Visit --    PT Start Time 1310    PT Stop Time 1408    PT Time Calculation (min) 58 min    Equipment Utilized During Treatment Gait belt    Activity Tolerance Patient tolerated treatment well;No increased pain    Behavior During Therapy WFL for tasks assessed/performed           Past Medical History:  Diagnosis Date  . Arthritis   . Carotid stenosis 11/2018  . Cataract    surgery to remove  . Diabetes mellitus    type 2  . Hyperlipidemia   . Hypertension   . Skin cancer    Removed from face  . Stroke ([place]) 09/19/2018  . Urgency of urination   . Urinary leakage     Past Surgical History:  Procedure Laterality Date  . ABDOMINAL HYSTERECTOMY  early 80's   total  . CAROTID ENDARTERECTOMY Left 11/13/2018  . CARPAL TUNNEL RELEASE Bilateral 1980 and 1981   both hands  . ENDARTERECTOMY Left 11/13/2018   Procedure: Left Carotid Artery Endarterectomy with Patch Angioplasty;  Surgeon: Rosetta Posner, MD;  Location: [facility];  Service: Vascular;  Laterality: Left;  . EYE SURGERY     bilateral cataracts  . Fatty Tumor Excision    . JOINT REPLACEMENT    . TONSILLECTOMY    . TONSILLECTOMY AND ADENOIDECTOMY  age 38  . TOTAL KNEE ARTHROPLASTY Right 10/04/2015  . TOTAL KNEE ARTHROPLASTY Right 10/04/2015   Procedure: TOTAL KNEE ARTHROPLASTY;  Surgeon: Dorna Leitz, MD;  Location: [facility];  Service: Orthopedics;  Laterality: Right;  . TOTAL KNEE ARTHROPLASTY Left 01/28/2016    Procedure: TOTAL KNEE ARTHROPLASTY;  Surgeon: Dorna Leitz, MD;  Location: [facility];  Service: Orthopedics;  Laterality: Left;  . TUBAL LIGATION      There were no vitals filed for this visit.    Subjective Assessment - 03/29/20 1315    Subjective Having difficulty walking distances; is able to walk some distance with cart assistance when shopping; this walking difficulty is possibly attributed to knee surgeries and stroke; right side cant distinguish temperature since the CVA    Pertinent History Stroke 09/19/2018; R TKA 10/04/2015; L TKA 01/28/2016    Limitations Walking    How long can you walk comfortably? 50 feet; walking to mailbox    Patient Stated Goals To walk further without pain and to feel better    Currently in Pain? Yes    Pain Score 3     Pain Location Leg    Pain Orientation Right;Left    Pain Descriptors / Indicators Aching;Tightness    Pain Type Chronic pain   Ever since Sept of 2017 and Jan 2018   Pain Onset More than a month ago    Pain Frequency Intermittent    Aggravating Factors  --   7/10 at worst   Effect of Pain on Daily Activities walking  [facility] PT Assessment - 03/29/20 1310      Assessment   Medical Diagnosis Gait Instability    Referring Provider (PT) Lamar Blinks, MD    Onset Date/Surgical Date --    Hand Dominance Right    Next MD Visit 09/15/2020    Prior Therapy Late 2017, early 2018      Precautions   Precautions Fall      Restrictions   Weight Bearing Restrictions No      Balance Screen   Has the patient fallen in the past 6 months No    Has the patient had a decrease in activity level because of a fear of falling?  No    Is the patient reluctant to leave their home because of a fear of falling?  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; limited by tiredness and the balance     Cognition   Overall Cognitive Status Within Functional Limits for tasks assessed      Sensation   Hot/Cold Impaired by gross assessment   Lost hot and cold sensation on her right side     Coordination   Gross Motor Movements are Fluid and Coordinated Yes      Strength   Right Hip Flexion 4/5    Right Hip Extension 4/5    Right Hip ABduction 4/5    Right Hip ADduction 4/5    Left Hip Flexion 4/5    Left Hip Extension 4/5    Left Hip ABduction 4/5   TTP around L knee   Left Hip ADduction 4/5    Right Knee Flexion 4+/5    Right Knee Extension 4+/5    Left Knee Flexion 4+/5    Left Knee Extension 4+/5    Right Ankle Dorsiflexion 4+/5    Right Ankle Plantar Flexion 4/5   17 reps   Left Ankle Dorsiflexion 4/5    Left Ankle Plantar Flexion 4/5   15 reps     Ambulation/Gait   Ambulation/Gait Yes    Ambulation/Gait Assistance 5: Supervision    Gait Pattern Step-through pattern;Decreased stride length;Decreased arm swing - right;Decreased arm swing - left;Decreased hip/knee flexion - right;Decreased hip/knee flexion - left;Decreased dorsiflexion - left;Decreased dorsiflexion - right;Poor foot clearance - left;Poor foot clearance - right;Shuffle;Trunk flexed;Right foot flat;Left foot flat;Wide base of support   Limited Heel to toe translation and limited heel strike on weight acceptance   Gait velocity 2.12 ft/sec    Stairs Yes    Stairs Assistance 4: Min guard    Stair Management Technique Two rails;Alternating pattern;Forwards    Number of Stairs 25    Height of Stairs 7    Gait Comments Hesitant to ambulate down steps      Balance    Balance Assessed Yes      Standardized Balance Assessment   Standardized Balance  Assessment Five Times Sit to Stand;Berg Balance Test;Timed Up and Go Test;10 meter walk test    Five times sit to stand comments  8.06 seconds    10 Meter Walk 15.47      Berg Balance Test   Sit to Stand Able to stand without using hands and stabilize independently    Standing Unsupported Able to stand safely 2 minutes    Sitting with Back Unsupported but Feet Supported on Floor or Stool Able to sit safely and securely 2 minutes    Stand to Sit Sits safely with minimal use of hands    Transfers Able to transfer safely, minor use of hands    Standing Unsupported with Eyes Closed Able to stand 10 seconds safely    Standing Unsupported with Feet Together Able to place feet together independently and stand 1 minute safely    From Standing, Reach Forward with Outstretched Arm Can reach forward >12 cm safely (5")    From Standing Position, Pick up Object from Floor Able to pick up shoe safely and easily    From Standing Position, Turn to Look Behind Over each Shoulder Looks behind from both sides and weight shifts well    Turn 360 Degrees Able to turn 360 degrees safely one side only in 4 seconds or less    Standing Unsupported, Alternately Place Feet on Step/Stool Needs assistance to keep from falling or unable to try    Standing Unsupported, One Foot in Front Needs help to step but can hold 15 seconds   15 seconds   Standing on One Leg Tries to lift leg/unable to hold 3 seconds but remains standing independently    Total Score 44    Berg comment: 37-45 significant (>80%)      Timed Up and Go Test   Normal TUG (seconds) 12.1      Functional Gait  Assessment   Gait assessed  Yes    Gait Level Surface Walks 20 ft, slow speed, abnormal gait pattern, evidence for imbalance or deviates 10-15 in outside of the 12 in walkway width. Requires more than 7 sec to ambulate 20 ft.    Change in Gait Speed Able to change  speed, demonstrates mild gait deviations, deviates 6-10 in outside of the 12 in walkway width, or no gait deviations, unable to achieve a major change in velocity, or uses a change in velocity, or uses an assistive device.    Gait with Horizontal Head Turns Performs head turns with moderate changes in gait velocity, slows down, deviates 10-15 in outside 12 in walkway width but recovers, can continue to walk.    Gait with Vertical Head Turns Performs task with moderate change in gait velocity, slows down, deviates 10-15 in outside 12 in walkway width but recovers, can continue to walk.    Gait and Pivot Turn Pivot turns safely in greater than 3 sec and stops with no loss of balance, or pivot turns safely within 3 sec and stops with mild imbalance, requires small steps to catch balance.    Step Over Obstacle Cannot perform without assistance.    Gait with Narrow Base of Support Ambulates less than 4 steps heel to toe or cannot perform without assistance.    Gait with Eyes Closed Cannot walk 20 ft without assistance, severe gait deviations or imbalance, deviates greater than 15 in outside 12 in walkway width or will not attempt task.    Ambulating Backwards Walks 20 ft, slow speed, abnormal gait pattern, evidence  for imbalance, deviates 10-15 in outside 12 in walkway width.    Steps Cannot do safely.    Total Score 8    FGA comment: < 19 = high risk fall                      Objective measurements completed on examination: See above findings.               PT Education - 03/29/20 1625    Education Details Education provided on purpose of gait assessment and importance of increasing LE strength and balance to prevent falls    Person(s) Educated Patient    Methods Explanation;Demonstration;Verbal cues;Tactile cues    Comprehension Verbalized understanding            PT Short Term Goals - 03/29/20 1716      PT SHORT TERM GOAL #1   Title Pt will score > or = 13 on FGA     Status New    Target Date 04/26/20      PT SHORT TERM GOAL #2   Title Pt. will be independent with initial HEP    Status New    Target Date 04/26/20             PT Long Term Goals - 03/29/20 1707      PT LONG TERM GOAL #1   Title Pt will score > 19 on the FGA to demonstrate an increase in dynamic ambulation function to decrease fall risk. .    Target Date 05/24/20      PT LONG TERM GOAL #2   Title Pt. will demonstrate independence with ongoing HEP for continuing improvements after DC    Status New    Target Date 05/24/20      PT LONG TERM GOAL #3   Title Pt. will increase B Hip strength to > or = 4+/5 for improved gait mechanics    Status New    Target Date 05/24/20      PT LONG TERM GOAL #4   Title Pt will score > or = 51 on the Berg Balance Test to demonstrate a decrease risk for falls    Status New    Target Date 05/24/20                  Plan - 03/29/20 1633    Clinical Impression Statement Wanda [name] is an 80 year old Pt. referred to outpatient PT for gait instability and claudication. Wanda [name] reports she is having difficulty walking long distances. She ties this complication to previous B knee replacements in 2017 and 2018. She presents with B LE weakness and intermittent pain 3/10 in her legs. Her gait includes decreased B arm swing, stride length, B ankle DF, knee and hip flex. She has a wide BOS, a flat foot on contact, and poor foot clearance. Her gait velocity is 2.12 ft/sec. She is hesitant to ambulate downstairs. Her Berg score of 44 places her in the significant risk category for falls, and her functional gait assessment score of 8 puts her at high risk of falls. Wanda [name] will benefit from skilled PT to improve her gait mechanics and efficiency so she can walk further and feel better.    Personal Factors and Comorbidities Age;Fitness;Past/Current Experience;Comorbidity 3+    Comorbidities Stroke, Arthritis, DM Type 2, HTN, B TKA    Examination-Activity Limitations  Stairs;Squat;Locomotion Level;Transfers;Carry    Examination-Participation Restrictions Cleaning;Community Activity;Shop    Stability/Clinical Decision Making Evolving/Moderate complexity  Clinical Decision Making Moderate    Rehab Potential Good    PT Frequency 2x / week    PT Duration 8 weeks    PT Treatment/Interventions ADLs/Self Care Home Management;Cryotherapy;Electrical Stimulation;Iontophoresis 4mg /ml Dexamethasone;Moist Heat;Ultrasound;Gait training;Stair training;Functional mobility training;Therapeutic activities;Therapeutic exercise;Balance training;Neuromuscular re-education;Manual techniques;Wheelchair mobility training;Orthotic Fit/Training;Patient/family education;Passive range of motion;Dry needling;Energy conservation;Joint Manipulations;Vestibular;Vasopneumatic Device;Taping    PT Next Visit Plan Develop an HEP ([place] exercise program) ; work on Conservation officer, nature and progress to dynamic balance; work on Aeronautical engineer;    Consulted and Agree with Plan of Care Patient           Patient will benefit from skilled therapeutic intervention in order to improve the following deficits and impairments:  Abnormal gait,Decreased coordination,Decreased range of motion,Difficulty walking,Impaired tone,Decreased endurance,Decreased activity tolerance,Decreased knowledge of precautions,Impaired perceived functional ability,Pain,Decreased balance,Improper body mechanics,Decreased mobility,Decreased strength,Postural dysfunction,Impaired sensation  Visit Diagnosis: Unsteadiness on feet  Muscle weakness (generalized)     Problem List Patient Active Problem List   Diagnosis Date Noted  . Carotid stenosis 11/13/2018  . CVA (cerebral vascular accident) ([place]) 09/19/2018  . Brainstem infarct, acute ([place])   . Primary osteoarthritis of left knee 10/04/2015  . Osteoporosis 08/23/2015  . Multinodular goiter (nontoxic) 04/07/2014  . Controlled type 2 diabetes with retinopathy ([place]) 10/09/2013   . Insulin adverse reaction 09/27/2013  . Senile nuclear sclerosis 08/14/2012  . Essential hypertension 03/10/2011  . Hyperlipidemia with target LDL less than 70 03/10/2011  . Obesity (BMI 30.0-34.9) 03/10/2011    Newman Nickels SPT 03/29/2020, 6:15 PM  [facility] [address]  [place], [place], [address] Phone: [phone]   Fax:  [phone]  Name: Wanda [name] MRN: [id number] Date of Birth: 1941-01-02

## 2020-04-07 ENCOUNTER — Ambulatory Visit: Payer: Medicare Other

## 2020-04-07 ENCOUNTER — Other Ambulatory Visit: Payer: Self-pay

## 2020-04-07 DIAGNOSIS — M6281 Muscle weakness (generalized): Secondary | ICD-10-CM | POA: Diagnosis not present

## 2020-04-07 DIAGNOSIS — R2681 Unsteadiness on feet: Secondary | ICD-10-CM

## 2020-04-07 NOTE — Therapy (Signed)
[name] [address]  [place], [place], [address] Phone: [phone]   Fax:  [phone]  Physical Therapy Treatment  Patient Details  Name: Wanda [name] MRN: [id number] Date of Birth: 1940-10-22 Referring Provider (PT): Lamar Blinks, MD   Encounter Date: 04/07/2020   PT End of Session - 04/07/20 1147    Visit Number 2    Number of Visits 16    Date for PT Re-Evaluation 05/24/20    Authorization Type Medicare    PT Start Time 1100    PT Stop Time 1141    PT Time Calculation (min) 41 min    Equipment Utilized During Treatment Gait belt    Activity Tolerance Patient tolerated treatment well    Behavior During Therapy [facility] for tasks assessed/performed           Past Medical History:  Diagnosis Date  . Arthritis   . Carotid stenosis 11/2018  . Cataract    surgery to remove  . Diabetes mellitus    type 2  . Hyperlipidemia   . Hypertension   . Skin cancer    Removed from face  . Stroke ([place]) 09/19/2018  . Urgency of urination   . Urinary leakage     Past Surgical History:  Procedure Laterality Date  . ABDOMINAL HYSTERECTOMY  early 80's   total  . CAROTID ENDARTERECTOMY Left 11/13/2018  . CARPAL TUNNEL RELEASE Bilateral 1980 and 1981   both hands  . ENDARTERECTOMY Left 11/13/2018   Procedure: Left Carotid Artery Endarterectomy with Patch Angioplasty;  Surgeon: Rosetta Posner, MD;  Location: [facility];  Service: Vascular;  Laterality: Left;  . EYE SURGERY     bilateral cataracts  . Fatty Tumor Excision    . JOINT REPLACEMENT    . TONSILLECTOMY    . TONSILLECTOMY AND ADENOIDECTOMY  age 52  . TOTAL KNEE ARTHROPLASTY Right 10/04/2015  . TOTAL KNEE ARTHROPLASTY Right 10/04/2015   Procedure: TOTAL KNEE ARTHROPLASTY;  Surgeon: Dorna Leitz, MD;  Location: [facility];  Service: Orthopedics;  Laterality: Right;  . TOTAL KNEE ARTHROPLASTY Left 01/28/2016   Procedure: TOTAL KNEE ARTHROPLASTY;  Surgeon: Dorna Leitz, MD;  Location: [facility];  Service: Orthopedics;  Laterality: Left;  . TUBAL LIGATION      There were no vitals filed for this visit.   Subjective Assessment - 04/07/20 1103    Subjective Pt reports she is tired most of time, no falls since the last time she came in.    Pertinent History Stroke 09/19/2018; R TKA 10/04/2015; L TKA 01/28/2016    Patient Stated Goals To walk further without pain and to feel better    Currently in Pain? Yes    Pain Location Leg    Pain Orientation Right    Pain Descriptors / Indicators Aching;Tightness    Pain Type Chronic pain                             [facility] Adult PT Treatment/Exercise - 04/07/20 0001      Exercises   Exercises Knee/Hip      Knee/Hip Exercises: Aerobic   Nustep L2x30min      Knee/Hip Exercises: Standing   Hip Abduction Stengthening;Both;1 set;10 reps;Knee straight;Limitations    Abduction Limitations 2 HHA at counter    Hip Extension Stengthening;Both;1 set;10 reps;Knee straight;Limitations    Extension Limitations 2 HHA at counter    Other Standing  Knee Exercises Marches 10 reps B 2 HHA at counter               Balance Exercises - 04/07/20 0001      Balance Exercises: Standing   SLS Eyes open;Solid surface;Upper extremity support 1;2 reps;10 secs    Tandem Gait Forward;2 reps;Limitations   along length of counter 1 HHA; ocassional LOB   Other Standing Exercises DLS on airex narrow stance EC 2x20 sec   swaying noted but able to recover with use of ankle strategy     OTAGO PROGRAM   Ankle Movements Standing;10 reps   heel and toe raise with 2 HHA at counter   Hip ABductor 10 reps   2 HHA   Knee Bends 10 reps, support   cues to not let knees go above toes and to hinge hips   Backwards Walking Support             PT Education - 04/07/20 1157    Education Details HEP update with OTAGO program, educated pt on safety and reveiwed exercises with pt.    Person(s) Educated Patient    Methods  Explanation;Demonstration;Verbal cues;Handout    Comprehension Verbalized understanding;Returned demonstration;Verbal cues required;Need further instruction            PT Short Term Goals - 04/07/20 1201      PT SHORT TERM GOAL #1   Title Pt will score > or = 13 on FGA    Status On-going    Target Date 04/26/20      PT SHORT TERM GOAL #2   Title Pt. will be independent with initial HEP    Status On-going    Target Date 04/26/20             PT Long Term Goals - 04/07/20 1201      PT LONG TERM GOAL #1   Title Pt will score > 19 on the FGA to demonstrate an increase in dynamic ambulation function to decrease fall risk. .    Status On-going      PT LONG TERM GOAL #2   Title Pt. will demonstrate independence with ongoing HEP for continuing improvements after DC    Status On-going      PT LONG TERM GOAL #3   Title Pt. will increase B Hip strength to > or = 4+/5 for improved gait mechanics    Status On-going      PT LONG TERM GOAL #4   Title Pt will score > or = 51 on the Berg Balance Test to demonstrate a decrease risk for falls    Status On-going                 Plan - 04/07/20 1147    Clinical Impression Statement Pt had a good response to the exercises given today with no reports of pain. Started with light hip strengthening along with statice an dynamic balance activites. She needs cueing for pacing and also keeping upright posture during exercises. Had some LOB during tandem gait and DLS but was able to recover herself with use of ankle strategy. Reviewed OTAGO program for HEP, with demonstration given to pt, she verbalized and demonstrated understanding. She noted that she has been very tired lately and was surprised that she did so well with PT today. Will plan to cont work on static and dynamic balance to decrease risk for falls and improve stability.    Personal Factors and Comorbidities Age;Fitness;Past/Current Experience;Comorbidity 3+  Comorbidities  Stroke, Arthritis, DM Type 2, HTN, B TKA    PT Frequency 2x / week    PT Duration 8 weeks    PT Treatment/Interventions ADLs/Self Care Home Management;Cryotherapy;Electrical Stimulation;Iontophoresis 4mg /ml Dexamethasone;Moist Heat;Ultrasound;Gait training;Stair training;Functional mobility training;Therapeutic activities;Therapeutic exercise;Balance training;Neuromuscular re-education;Manual techniques;Wheelchair mobility training;Orthotic Fit/Training;Patient/family education;Passive range of motion;Dry needling;Energy conservation;Joint Manipulations;Vestibular;Vasopneumatic Device;Taping    PT Next Visit Plan Develop an HEP ([place] exercise program) ; work on Conservation officer, nature and progress to dynamic balance; work on Aeronautical engineer;    Consulted and Agree with Plan of Care Patient           Patient will benefit from skilled therapeutic intervention in order to improve the following deficits and impairments:  Abnormal gait,Decreased coordination,Decreased range of motion,Difficulty walking,Impaired tone,Decreased endurance,Decreased activity tolerance,Decreased knowledge of precautions,Impaired perceived functional ability,Pain,Decreased balance,Improper body mechanics,Decreased mobility,Decreased strength,Postural dysfunction,Impaired sensation  Visit Diagnosis: Unsteadiness on feet  Muscle weakness (generalized)     Problem List Patient Active Problem List   Diagnosis Date Noted  . Carotid stenosis 11/13/2018  . CVA (cerebral vascular accident) ([place]) 09/19/2018  . Brainstem infarct, acute ([place])   . Primary osteoarthritis of left knee 10/04/2015  . Osteoporosis 08/23/2015  . Multinodular goiter (nontoxic) 04/07/2014  . Controlled type 2 diabetes with retinopathy ([place]) 10/09/2013  . Insulin adverse reaction 09/27/2013  . Senile nuclear sclerosis 08/14/2012  . Essential hypertension 03/10/2011  . Hyperlipidemia with target LDL less than 70 03/10/2011  . Obesity (BMI 30.0-34.9)  03/10/2011    Artist Pais, PTA 04/07/2020, 12:02 PM  [facility] [address]  [place], [place], [address] Phone: [phone]   Fax:  [phone]  Name: Wanda [name] MRN: [id number] Date of Birth: 01-16-1940

## 2020-04-12 ENCOUNTER — Other Ambulatory Visit: Payer: Self-pay

## 2020-04-12 ENCOUNTER — Encounter: Payer: Self-pay | Admitting: Physical Therapy

## 2020-04-12 ENCOUNTER — Ambulatory Visit: Payer: Medicare Other | Attending: Family Medicine | Admitting: Physical Therapy

## 2020-04-12 DIAGNOSIS — R2681 Unsteadiness on feet: Secondary | ICD-10-CM | POA: Insufficient documentation

## 2020-04-12 DIAGNOSIS — M6281 Muscle weakness (generalized): Secondary | ICD-10-CM | POA: Insufficient documentation

## 2020-04-12 NOTE — Therapy (Signed)
[facility] [address]  [place], [place], [address] Phone: [phone]   Fax:  [phone]  Physical Therapy Treatment  Patient Details  Name: Wanda [name] MRN: [id number] Date of Birth: 1940-12-06 Referring Provider (PT): Lamar Blinks, MD   Encounter Date: 04/12/2020   PT End of Session - 04/12/20 1315    Visit Number 3    Number of Visits 16    Date for PT Re-Evaluation 05/24/20    Authorization Type Medicare    PT Start Time 1315    PT Stop Time 1358    PT Time Calculation (min) 43 min    Equipment Utilized During Treatment Gait belt    Activity Tolerance Patient tolerated treatment well    Behavior During Therapy [facility] for tasks assessed/performed           Past Medical History:  Diagnosis Date  . Arthritis   . Carotid stenosis 11/2018  . Cataract    surgery to remove  . Diabetes mellitus    type 2  . Hyperlipidemia   . Hypertension   . Skin cancer    Removed from face  . Stroke ([place]) 09/19/2018  . Urgency of urination   . Urinary leakage     Past Surgical History:  Procedure Laterality Date  . ABDOMINAL HYSTERECTOMY  early 80's   total  . CAROTID ENDARTERECTOMY Left 11/13/2018  . CARPAL TUNNEL RELEASE Bilateral 1980 and 1981   both hands  . ENDARTERECTOMY Left 11/13/2018   Procedure: Left Carotid Artery Endarterectomy with Patch Angioplasty;  Surgeon: Rosetta Posner, MD;  Location: [facility];  Service: Vascular;  Laterality: Left;  . EYE SURGERY     bilateral cataracts  . Fatty Tumor Excision    . JOINT REPLACEMENT    . TONSILLECTOMY    . TONSILLECTOMY AND ADENOIDECTOMY  age 40  . TOTAL KNEE ARTHROPLASTY Right 10/04/2015  . TOTAL KNEE ARTHROPLASTY Right 10/04/2015   Procedure: TOTAL KNEE ARTHROPLASTY;  Surgeon: Dorna Leitz, MD;  Location: [facility];  Service: Orthopedics;  Laterality: Right;  . TOTAL KNEE ARTHROPLASTY Left 01/28/2016   Procedure: TOTAL KNEE ARTHROPLASTY;  Surgeon: Dorna Leitz, MD;  Location: [facility];  Service: Orthopedics;  Laterality: Left;  . TUBAL LIGATION      There were no vitals filed for this visit.   Subjective Assessment - 04/12/20 1318    Subjective Pt reports she tried the exercises indicated in the [facility] and denies any problems. She notes she feels more tired today and when she is tired she has a harder time walking.    Pertinent History Stroke 09/19/2018; R TKA 10/04/2015; L TKA 01/28/2016    Patient Stated Goals To walk further without pain and to feel better    Currently in Pain? Yes    Pain Score 4     Pain Location Leg    Pain Orientation Right;Left    Pain Descriptors / Indicators Aching    Pain Type Chronic pain                             [facility] Adult PT Treatment/Exercise - 04/12/20 1315      Exercises   Exercises Knee/Hip      Knee/Hip Exercises: Aerobic   Nustep L2 x 32min      Knee/Hip Exercises: Standing   Other Standing Knee Exercises Alt toe clears to 6" step x 20  Balance Exercises - 04/12/20 1315      OTAGO PROGRAM   Head Movements Standing;5 reps    Neck Movements Standing;5 reps    Back Extension Standing;5 reps    Trunk Movements Standing;5 reps    Ankle Movements Sitting;10 reps    Knee Extensor 10 reps    Knee Flexor 10 reps    Hip ABductor 10 reps    Ankle Plantorflexors 20 reps, support    Ankle Dorsiflexors 20 reps, support    Knee Bends 10 reps, support    Backwards Walking Support    Walking and Turning Around No assistive device    Sideways Walking No assistive device    Tandem Stance 10 seconds, support    Tandem Walk Support    One Leg Stand 10 seconds, support    Heel Walking Support    Toe Walk Support    Heel Toe Walking Backward --   Level C=Support   Sit to Stand 10 reps, no support    Overall OTAGO Comments Pt instructed to complete full program at least 3x/wk.               PT Short Term Goals - 04/12/20 1322      PT SHORT TERM  GOAL #1   Title Pt will score > or = 13 on FGA    Status On-going    Target Date 04/26/20      PT SHORT TERM GOAL #2   Title Pt. will be independent with initial HEP    Status On-going    Target Date 04/26/20             PT Long Term Goals - 04/12/20 1321      PT LONG TERM GOAL #1   Title Pt will score > 19 on the FGA to demonstrate an increase in dynamic ambulation function to decrease fall risk. .    Status On-going    Target Date 05/24/20      PT LONG TERM GOAL #2   Title Pt. will demonstrate independence with ongoing HEP for continuing improvements after DC    Status On-going    Target Date 05/24/20      PT LONG TERM GOAL #3   Title Pt. will increase B Hip strength to > or = 4+/5 for improved gait mechanics    Status On-going    Target Date 05/24/20      PT LONG TERM GOAL #4   Title Pt will score > or = 51 on the Berg Balance Test to demonstrate a decrease risk for falls    Status On-going    Target Date 05/24/20                 Plan - 04/12/20 1322    Clinical Impression Statement Carrissa reports no issues with initial [place] activities prescribed last visit and demonstrated good tolerance for progress of activities to include majority of program at supported level - pt encouraged to complete full program at least 3/wk, breaking up exercises to her tolerance and allowing for rest breaks as needed. She reports she feels more tired today which makes walking more difficult with pt demonstrating fwd flexed posture and decreased hip and knee flexion leading to poor foot clearance - worked on toe clears to increase hip and knee flexion and provided cues for heel strike on weight acceptance to promote better toe clearance with pt able to walk out of clinic with improved gait pattern.    Personal  Factors and Comorbidities Age;Fitness;Past/Current Experience;Comorbidity 3+    Comorbidities Stroke, Arthritis, DM Type 2, HTN, B TKA    Rehab Potential Good    PT Frequency 2x /  week    PT Duration 8 weeks    PT Treatment/Interventions ADLs/Self Care Home Management;Cryotherapy;Electrical Stimulation;Iontophoresis 4mg /ml Dexamethasone;Moist Heat;Ultrasound;Gait training;Stair training;Functional mobility training;Therapeutic activities;Therapeutic exercise;Balance training;Neuromuscular re-education;Manual techniques;Wheelchair mobility training;Orthotic Fit/Training;Patient/family education;Passive range of motion;Dry needling;Energy conservation;Joint Manipulations;Vestibular;Vasopneumatic Device;Taping    PT Next Visit Plan work on static balance and progress to dynamic balance; postural education and strengtheing to promote upright gait posture; work on Aeronautical engineer; review/advance [employer] fall prevention program as indicated    Oncologist with Plan of Care Patient           Patient will benefit from skilled therapeutic intervention in order to improve the following deficits and impairments:  Abnormal gait,Decreased coordination,Decreased range of motion,Difficulty walking,Impaired tone,Decreased endurance,Decreased activity tolerance,Decreased knowledge of precautions,Impaired perceived functional ability,Pain,Decreased balance,Improper body mechanics,Decreased mobility,Decreased strength,Postural dysfunction,Impaired sensation  Visit Diagnosis: Unsteadiness on feet  Muscle weakness (generalized)     Problem List Patient Active Problem List   Diagnosis Date Noted  . Carotid stenosis 11/13/2018  . CVA (cerebral vascular accident) ([place]) 09/19/2018  . Brainstem infarct, acute ([place])   . Primary osteoarthritis of left knee 10/04/2015  . Osteoporosis 08/23/2015  . Multinodular goiter (nontoxic) 04/07/2014  . Controlled type 2 diabetes with retinopathy ([place]) 10/09/2013  . Insulin adverse reaction 09/27/2013  . Senile nuclear sclerosis 08/14/2012  . Essential hypertension 03/10/2011  . Hyperlipidemia with target LDL less than 70 03/10/2011  . Obesity  (BMI 30.0-34.9) 03/10/2011    Percival Spanish, PT, MPT 04/12/2020, 6:10 PM  [facility] [address]  [place], [place], [address] Phone: [phone]   Fax:  [phone]  Name: Wanda [name] MRN: [id number] Date of Birth: [birth year]

## 2020-04-15 ENCOUNTER — Other Ambulatory Visit: Payer: Self-pay

## 2020-04-15 ENCOUNTER — Ambulatory Visit: Payer: Medicare Other

## 2020-04-15 DIAGNOSIS — M6281 Muscle weakness (generalized): Secondary | ICD-10-CM | POA: Diagnosis not present

## 2020-04-15 DIAGNOSIS — R2681 Unsteadiness on feet: Secondary | ICD-10-CM | POA: Diagnosis not present

## 2020-04-15 NOTE — Therapy (Signed)
[facility] [address]  [place], [place], [address] Phone: [phone]   Fax:  [phone]  Physical Therapy Treatment  Patient Details  Name: Wanda [name] MRN: [id number] Date of Birth: [birth year] Referring Provider (PT): Lamar Blinks, MD   Encounter Date: 04/15/2020   PT End of Session - 04/15/20 1319    Visit Number 4    Number of Visits 16    Date for PT Re-Evaluation 05/24/20    Authorization Type Medicare    PT Start Time 1315    PT Stop Time 1358    PT Time Calculation (min) 43 min    Equipment Utilized During Treatment Gait belt    Activity Tolerance Patient tolerated treatment well    Behavior During Therapy [facility] for tasks assessed/performed           Past Medical History:  Diagnosis Date  . Arthritis   . Carotid stenosis 11/2018  . Cataract    surgery to remove  . Diabetes mellitus    type 2  . Hyperlipidemia   . Hypertension   . Skin cancer    Removed from face  . Stroke ([place]) 09/19/2018  . Urgency of urination   . Urinary leakage     Past Surgical History:  Procedure Laterality Date  . ABDOMINAL HYSTERECTOMY  early 80's   total  . CAROTID ENDARTERECTOMY Left 11/13/2018  . CARPAL TUNNEL RELEASE Bilateral 1980 and 1981   both hands  . ENDARTERECTOMY Left 11/13/2018   Procedure: Left Carotid Artery Endarterectomy with Patch Angioplasty;  Surgeon: Rosetta Posner, MD;  Location: [place];  Service: Vascular;  Laterality: Left;  . EYE SURGERY     bilateral cataracts  . Fatty Tumor Excision    . JOINT REPLACEMENT    . TONSILLECTOMY    . TONSILLECTOMY AND ADENOIDECTOMY  age 33  . TOTAL KNEE ARTHROPLASTY Right 10/04/2015  . TOTAL KNEE ARTHROPLASTY Right 10/04/2015   Procedure: TOTAL KNEE ARTHROPLASTY;  Surgeon: Dorna Leitz, MD;  Location: [place];  Service: Orthopedics;  Laterality: Right;  . TOTAL KNEE ARTHROPLASTY Left 01/28/2016   Procedure: TOTAL KNEE ARTHROPLASTY;  Surgeon: Dorna Leitz, MD;  Location: [place];  Service: Orthopedics;  Laterality: Left;  . TUBAL LIGATION      There were no vitals filed for this visit.   Subjective Assessment - 04/15/20 1315    Subjective Pt reports she is just so tired all the time, thinks that's why her doctor sent her to therapy. She is doing the [facility] with no problems.    Pertinent History Stroke 09/19/2018; R TKA 10/04/2015; L TKA 01/28/2016    Patient Stated Goals To walk further without pain and to feel better    Currently in Pain? Yes    Pain Score 4     Pain Location Leg    Pain Orientation Right;Left    Pain Descriptors / Indicators Aching    Pain Type Chronic pain                             [facility] Adult PT Treatment/Exercise - 04/15/20 0001      Self-Care   Self-Care Other Self-Care Comments;Posture    Posture pt is in anterior pelvic tilt; educated on improtance of neutral pelvis for improved balance; explanation for performing PPT on mat and H/L clamshell with focus on PPT since pt reported she doesn't like doing that  Other Self-Care Comments  see pt edu      Exercises   Exercises Knee/Hip      Knee/Hip Exercises: Aerobic   Nustep L4 x 5 min      Knee/Hip Exercises: Standing   Heel Raises Both;1 set;15 reps    Heel Raises Limitations to toe raises    Forward Lunges --   20 seconds each with light CGA   Forward Lunges Limitations iso hold with small lunge and opposite HHA on TM rail    Hip Abduction Stengthening;Both;1 set;15 reps    Abduction Limitations 2 HHA at TM rail    Functional Squat 1 set;10 reps    Functional Squat Limitations at TM rail   verbal cues to lead with hips and squeeze glutes to improve neutral pelvis from anterior tilt   Other Standing Knee Exercises Alt toe clears to TM x 20      Knee/Hip Exercises: Seated   Sit to Sand 2 sets;10 reps;without UE support   red theraband above knees for 2nd round - incr difficulty to maintain knee separation performed more  slowly, focus on eccentric     Knee/Hip Exercises: Supine   Bridges Limitations cramping in R HS - worked on PPT with ball b/t knees    Other Supine Knee/Hip Exercises clamshell with red band + imprinted spine   hands to belly pulling in to remind not to bulge out                 PT Education - 04/15/20 1412    Education Details EDU on pelvis position, COG and upright posture    Person(s) Educated Patient    Methods Explanation;Demonstration;Tactile cues;Verbal cues    Comprehension Verbalized understanding;Returned demonstration;Verbal cues required;Tactile cues required;Need further instruction            PT Short Term Goals - 04/12/20 1322      PT SHORT TERM GOAL #1   Title Pt will score > or = 13 on FGA    Status On-going    Target Date 04/26/20      PT SHORT TERM GOAL #2   Title Pt. will be independent with initial HEP    Status On-going    Target Date 04/26/20             PT Long Term Goals - 04/12/20 1321      PT LONG TERM GOAL #1   Title Pt will score > 19 on the FGA to demonstrate an increase in dynamic ambulation function to decrease fall risk. .    Status On-going    Target Date 05/24/20      PT LONG TERM GOAL #2   Title Pt. will demonstrate independence with ongoing HEP for continuing improvements after DC    Status On-going    Target Date 05/24/20      PT LONG TERM GOAL #3   Title Pt. will increase B Hip strength to > or = 4+/5 for improved gait mechanics    Status On-going    Target Date 05/24/20      PT LONG TERM GOAL #4   Title Pt will score > or = 51 on the Berg Balance Test to demonstrate a decrease risk for falls    Status On-going    Target Date 05/24/20                 Plan - 04/15/20 1319    Clinical Impression Statement Pt ambulates with anterior weight shift, anterior  pelvic tilt, and overall forward lean. Addressed pelvic position with supine posterior pelvic tilts and hooklying clams in neutral pelvis, as well as  with hip lead in squat/sit to stand cuing patient to squeeze glutes, in order to move hips forward and find neutral tilt. Pt tolerated session well, despite report of overall fatigue. She is quad dominant and had difficulty performing sit to stand with band above knees, secondary to forcing hips to engage and not just quads.    Personal Factors and Comorbidities Age;Fitness;Past/Current Experience;Comorbidity 3+    Comorbidities Stroke, Arthritis, DM Type 2, HTN, B TKA    Rehab Potential Good    PT Frequency 2x / week    PT Duration 8 weeks    PT Treatment/Interventions ADLs/Self Care Home Management;Cryotherapy;Electrical Stimulation;Iontophoresis 4mg /ml Dexamethasone;Moist Heat;Ultrasound;Gait training;Stair training;Functional mobility training;Therapeutic activities;Therapeutic exercise;Balance training;Neuromuscular re-education;Manual techniques;Wheelchair mobility training;Orthotic Fit/Training;Patient/family education;Passive range of motion;Dry needling;Energy conservation;Joint Manipulations;Vestibular;Vasopneumatic Device;Taping    PT Next Visit Plan work on static balance and progress to dynamic balance; postural education and strengtheing to promote upright gait posture; work on Aeronautical engineer; review/advance [place] fall prevention program as indicated    Oncologist with Plan of Care Patient           Patient will benefit from skilled therapeutic intervention in order to improve the following deficits and impairments:  Abnormal gait,Decreased coordination,Decreased range of motion,Difficulty walking,Impaired tone,Decreased endurance,Decreased activity tolerance,Decreased knowledge of precautions,Impaired perceived functional ability,Pain,Decreased balance,Improper body mechanics,Decreased mobility,Decreased strength,Postural dysfunction,Impaired sensation  Visit Diagnosis: Unsteadiness on feet  Muscle weakness (generalized)     Problem List Patient Active Problem List    Diagnosis Date Noted  . Carotid stenosis 11/13/2018  . CVA (cerebral vascular accident) ([place]) 09/19/2018  . Brainstem infarct, acute ([place])   . Primary osteoarthritis of left knee 10/04/2015  . Osteoporosis 08/23/2015  . Multinodular goiter (nontoxic) 04/07/2014  . Controlled type 2 diabetes with retinopathy ([place]) 10/09/2013  . Insulin adverse reaction 09/27/2013  . Senile nuclear sclerosis 08/14/2012  . Essential hypertension 03/10/2011  . Hyperlipidemia with target LDL less than 70 03/10/2011  . Obesity (BMI 30.0-34.9) 03/10/2011    Izell Galena, PT, DPT 04/15/2020, 2:17 PM  [facility] [address]  [place], [place], [address] Phone: [phone]   Fax:  [phone]  Name: Wanda [name] MRN: [id number] Date of Birth: 1940-02-12

## 2020-04-19 ENCOUNTER — Other Ambulatory Visit: Payer: Self-pay

## 2020-04-19 ENCOUNTER — Ambulatory Visit: Payer: Medicare Other

## 2020-04-19 DIAGNOSIS — R2681 Unsteadiness on feet: Secondary | ICD-10-CM

## 2020-04-19 DIAGNOSIS — M6281 Muscle weakness (generalized): Secondary | ICD-10-CM

## 2020-04-19 NOTE — Therapy (Signed)
[facility] [address]  [place], [place], [address] Phone: [phone]   Fax:  [phone]  Physical Therapy Treatment  Patient Details  Name: Wanda [name] MRN: [id number] Date of Birth: 1940/11/10 Referring Provider (PT): Lamar Blinks, MD   Encounter Date: 04/19/2020   PT End of Session - 04/19/20 1441    Visit Number 5    Number of Visits 16    Date for PT Re-Evaluation 05/24/20    Authorization Type Medicare    PT Start Time 1401    PT Stop Time 1441    PT Time Calculation (min) 40 min    Activity Tolerance Patient tolerated treatment well    Behavior During Therapy [facility] for tasks assessed/performed           Past Medical History:  Diagnosis Date  . Arthritis   . Carotid stenosis 11/2018  . Cataract    surgery to remove  . Diabetes mellitus    type 2  . Hyperlipidemia   . Hypertension   . Skin cancer    Removed from face  . Stroke ([place]) 09/19/2018  . Urgency of urination   . Urinary leakage     Past Surgical History:  Procedure Laterality Date  . ABDOMINAL HYSTERECTOMY  early 80's   total  . CAROTID ENDARTERECTOMY Left 11/13/2018  . CARPAL TUNNEL RELEASE Bilateral 1980 and 1981   both hands  . ENDARTERECTOMY Left 11/13/2018   Procedure: Left Carotid Artery Endarterectomy with Patch Angioplasty;  Surgeon: Rosetta Posner, MD;  Location: [facility];  Service: Vascular;  Laterality: Left;  . EYE SURGERY     bilateral cataracts  . Fatty Tumor Excision    . JOINT REPLACEMENT    . TONSILLECTOMY    . TONSILLECTOMY AND ADENOIDECTOMY  age 9  . TOTAL KNEE ARTHROPLASTY Right 10/04/2015  . TOTAL KNEE ARTHROPLASTY Right 10/04/2015   Procedure: TOTAL KNEE ARTHROPLASTY;  Surgeon: Dorna Leitz, MD;  Location: [facility];  Service: Orthopedics;  Laterality: Right;  . TOTAL KNEE ARTHROPLASTY Left 01/28/2016   Procedure: TOTAL KNEE ARTHROPLASTY;  Surgeon: Dorna Leitz, MD;  Location: [facility];  Service:  Orthopedics;  Laterality: Left;  . TUBAL LIGATION      There were no vitals filed for this visit.   Subjective Assessment - 04/19/20 1405    Subjective Pt reports she is doing good with the exercises not experiencing as much fatigue.    Pertinent History Stroke 09/19/2018; R TKA 10/04/2015; L TKA 01/28/2016    Patient Stated Goals To walk further without pain and to feel better    Currently in Pain? Yes    Pain Score 4     Pain Location Leg    Pain Orientation Right;Left    Pain Descriptors / Indicators Aching    Pain Type Acute pain                             [facility] Adult PT Treatment/Exercise - 04/19/20 0001      Exercises   Exercises Knee/Hip      Knee/Hip Exercises: Aerobic   Nustep L3x27min      Knee/Hip Exercises: Standing   Hip Abduction Stengthening;Both;2 sets;10 reps;Knee straight    Abduction Limitations 2 HHA at treadmill    Hip Extension Stengthening;Both;1 set;10 reps;Knee straight    Extension Limitations 2 HHA at treadmill      Knee/Hip Exercises: Supine  Bridges Strengthening;Both;1 set;10 reps    Other Supine Knee/Hip Exercises ball squeeze 10x3"    Other Supine Knee/Hip Exercises clamshells with Rtband 10 reps               Balance Exercises - 04/19/20 0001      Balance Exercises: Standing   Standing Eyes Closed Narrow base of support (BOS);Foam/compliant surface;2 reps;20 secs    Tandem Stance Eyes closed;Foam/compliant surface;Upper extremity support 1;1 rep;20 secs    SLS Eyes open;Foam/compliant surface;Upper extremity support 2;1 rep;10 secs   B   Tandem Gait Forward;Upper extremity support;3 reps   semi tandem; along the length of counter   Marching Foam/compliant surface;Upper extremity assist 1;10 reps;Forwards;Static   CGA with gait belt   Other Standing Exercises toe taps from airex to cones 10 reps B               PT Short Term Goals - 04/12/20 1322      PT SHORT TERM GOAL #1   Title Pt will score > or = 13  on FGA    Status On-going    Target Date 04/26/20      PT SHORT TERM GOAL #2   Title Pt. will be independent with initial HEP    Status On-going    Target Date 04/26/20             PT Long Term Goals - 04/12/20 1321      PT LONG TERM GOAL #1   Title Pt will score > 19 on the FGA to demonstrate an increase in dynamic ambulation function to decrease fall risk. .    Status On-going    Target Date 05/24/20      PT LONG TERM GOAL #2   Title Pt. will demonstrate independence with ongoing HEP for continuing improvements after DC    Status On-going    Target Date 05/24/20      PT LONG TERM GOAL #3   Title Pt. will increase B Hip strength to > or = 4+/5 for improved gait mechanics    Status On-going    Target Date 05/24/20      PT LONG TERM GOAL #4   Title Pt will score > or = 51 on the Berg Balance Test to demonstrate a decrease risk for falls    Status On-going    Target Date 05/24/20                 Plan - 04/19/20 1442    Clinical Impression Statement Good response from pt. Mainly focused on static standing balance activities facilitating the use of ankle strategies of balance and light hip strengthening for proximal stability. Cues given for upright posture and control with movements of the LEs. She required CGA to complete all the balance activities with safety. Some cramping in her R hs at the beginning of the bridges but she was able to complete them after a few more reps.    Personal Factors and Comorbidities Age;Fitness;Past/Current Experience;Comorbidity 3+    Comorbidities Stroke, Arthritis, DM Type 2, HTN, B TKA    PT Frequency 2x / week    PT Duration 8 weeks    PT Treatment/Interventions ADLs/Self Care Home Management;Cryotherapy;Electrical Stimulation;Iontophoresis 4mg /ml Dexamethasone;Moist Heat;Ultrasound;Gait training;Stair training;Functional mobility training;Therapeutic activities;Therapeutic exercise;Balance training;Neuromuscular re-education;Manual  techniques;Wheelchair mobility training;Orthotic Fit/Training;Patient/family education;Passive range of motion;Dry needling;Energy conservation;Joint Manipulations;Vestibular;Vasopneumatic Device;Taping    PT Next Visit Plan work on static balance and progress to dynamic balance; postural education and strengtheing to promote  upright gait posture; work on Aeronautical engineer; review/advance [place] fall prevention program as indicated    Oncologist with Plan of Care Patient           Patient will benefit from skilled therapeutic intervention in order to improve the following deficits and impairments:  Abnormal gait,Decreased coordination,Decreased range of motion,Difficulty walking,Impaired tone,Decreased endurance,Decreased activity tolerance,Decreased knowledge of precautions,Impaired perceived functional ability,Pain,Decreased balance,Improper body mechanics,Decreased mobility,Decreased strength,Postural dysfunction,Impaired sensation  Visit Diagnosis: Unsteadiness on feet  Muscle weakness (generalized)     Problem List Patient Active Problem List   Diagnosis Date Noted  . Carotid stenosis 11/13/2018  . CVA (cerebral vascular accident) ([place]) 09/19/2018  . Brainstem infarct, acute ([place])   . Primary osteoarthritis of left knee 10/04/2015  . Osteoporosis 08/23/2015  . Multinodular goiter (nontoxic) 04/07/2014  . Controlled type 2 diabetes with retinopathy ([place]) 10/09/2013  . Insulin adverse reaction 09/27/2013  . Senile nuclear sclerosis 08/14/2012  . Essential hypertension 03/10/2011  . Hyperlipidemia with target LDL less than 70 03/10/2011  . Obesity (BMI 30.0-34.9) 03/10/2011    Artist Pais, PTA 04/19/2020, 3:00 PM  [facility] [address]  [place], [place], [address] Phone: [phone]   Fax:  [phone]  Name: Alinda Egolf MRN: [id number] Date of Birth: 04/12/1940

## 2020-04-22 ENCOUNTER — Ambulatory Visit: Payer: Medicare Other | Admitting: Physical Therapy

## 2020-04-22 ENCOUNTER — Other Ambulatory Visit: Payer: Self-pay

## 2020-04-22 ENCOUNTER — Encounter: Payer: Self-pay | Admitting: Physical Therapy

## 2020-04-22 DIAGNOSIS — M6281 Muscle weakness (generalized): Secondary | ICD-10-CM

## 2020-04-22 DIAGNOSIS — R2681 Unsteadiness on feet: Secondary | ICD-10-CM | POA: Diagnosis not present

## 2020-04-22 NOTE — Patient Instructions (Signed)
  Access Code: [email or web address] URL: [email or web address] Date: 04/22/2020 Prepared by: Annie Paras  Exercises Romberg Stance Eyes Closed on Foam Pad - 2 x daily - 7 x weekly - 2 sets - 1 reps - 30 hold Romberg Stance on Foam Pad - 2 x daily - 7 x weekly - 2 sets - 1 reps - 30 hold Standing on Foam Pad - 2 x daily - 7 x weekly - 2 sets - 1 reps - 30 hold Standing Balance with Eyes Closed on Foam - 2 x daily - 7 x weekly - 2 sets - 1 reps - 30 hold

## 2020-04-22 NOTE — Therapy (Signed)
[facility] [address]  [place], [place], [address] Phone: [phone]   Fax:  [phone]  Physical Therapy Treatment  Patient Details  Name: Wanda [name] MRN: [id number] Date of Birth: 09-15-1940 Referring Provider (PT): Lamar Blinks, MD   Encounter Date: 04/22/2020   PT End of Session - 04/22/20 1453    Visit Number 6    Number of Visits 16    Date for PT Re-Evaluation 05/24/20    Authorization Type Medicare    PT Start Time 1400    PT Stop Time 1444    PT Time Calculation (min) 44 min    Equipment Utilized During Treatment Gait belt    Activity Tolerance Patient tolerated treatment well    Behavior During Therapy [facility] for tasks assessed/performed           Past Medical History:  Diagnosis Date  . Arthritis   . Carotid stenosis 11/2018  . Cataract    surgery to remove  . Diabetes mellitus    type 2  . Hyperlipidemia   . Hypertension   . Skin cancer    Removed from face  . Stroke ([place]) 09/19/2018  . Urgency of urination   . Urinary leakage     Past Surgical History:  Procedure Laterality Date  . ABDOMINAL HYSTERECTOMY  early 80's   total  . CAROTID ENDARTERECTOMY Left 11/13/2018  . CARPAL TUNNEL RELEASE Bilateral 1980 and 1981   both hands  . ENDARTERECTOMY Left 11/13/2018   Procedure: Left Carotid Artery Endarterectomy with Patch Angioplasty;  Surgeon: Rosetta Posner, MD;  Location: [facility] [place];  Service: Vascular;  Laterality: Left;  . EYE SURGERY     bilateral cataracts  . Fatty Tumor Excision    . JOINT REPLACEMENT    . TONSILLECTOMY    . TONSILLECTOMY AND ADENOIDECTOMY  age 80  . TOTAL KNEE ARTHROPLASTY Right 10/04/2015  . TOTAL KNEE ARTHROPLASTY Right 10/04/2015   Procedure: TOTAL KNEE ARTHROPLASTY;  Surgeon: Dorna Leitz, MD;  Location: [facility];  Service: Orthopedics;  Laterality: Right;  . TOTAL KNEE ARTHROPLASTY Left 01/28/2016   Procedure: TOTAL KNEE ARTHROPLASTY;  Surgeon: Dorna Leitz, MD;  Location: [place];  Service: Orthopedics;  Laterality: Left;  . TUBAL LIGATION      There were no vitals filed for this visit.   Subjective Assessment - 04/22/20 1401    Subjective Pt reports she is feeling about the same as Monday; feels she is progressing with the exercises and enjoys challenges meaning she wants to progress HEP.    Pertinent History Stroke 09/19/2018; R TKA 10/04/2015; L TKA 01/28/2016    Patient Stated Goals To walk further without pain and to feel better    Currently in Pain? Yes    Pain Score 3     Pain Location Leg    Pain Orientation Right;Left    Pain Descriptors / Indicators Aching    Pain Type Acute pain                             [facility] Adult PT Treatment/Exercise - 04/22/20 1359      Exercises   Exercises Knee/Hip      Knee/Hip Exercises: Aerobic   Nustep L4 x 6      Knee/Hip Exercises: Standing   Knee Flexion --   3 second holds   Hip Flexion AROM;Both;1 set;10 reps    Hip Flexion  Limitations Standing at the counter with hand raill assist too    Hip Abduction AROM;Right;Left;1 set;10 reps    Abduction Limitations 2 HHA at counter    Hip Extension Stengthening;Both;1 set;10 reps;Knee straight    Extension Limitations 2 HHA at counter               Balance Exercises - 04/22/20 0001      Balance Exercises: Standing   Standing Eyes Opened Wide (BOA);Head turns;30 secs;Time;Cognitive challenge;Other reps (comment);Narrow base of support (BOS);Foam/compliant surface   Head turns up and down, left and right 10x each;   Standing Eyes Opened Time 30 seconds    Standing Eyes Closed Narrow base of support (BOS);Wide (BOA);Foam/compliant surface;1 rep;30 secs    Tandem Stance Eyes open;Foam/compliant surface;Upper extremity support 2;1 rep;10 secs   limited by balance today   SLS Eyes open;Solid surface;Foam/compliant surface;Upper extremity support 1;Upper extremity support 2;2 reps;30 secs   1 set for 30 seconds each  leg on solid surface, 1 set on airex   Gait with Head Turns Forward;2 reps   30 feet each direction; gait belt and supervision   Tandem Gait Forward;Upper extremity support;4 reps   Gait belt and Supervision            PT Education - 04/22/20 1453    Education Details Education on HEP access code: [email or web address]    Person(s) Educated Patient    Methods Explanation;Demonstration;Tactile cues;Verbal cues;Handout    Comprehension Verbal cues required;Tactile cues required;Returned demonstration;Verbalized understanding            PT Short Term Goals - 04/12/20 1322      PT SHORT TERM GOAL #1   Title Pt will score > or = 13 on FGA    Status On-going    Target Date 04/26/20      PT SHORT TERM GOAL #2   Title Pt. will be independent with initial HEP    Status On-going    Target Date 04/26/20             PT Long Term Goals - 04/12/20 1321      PT LONG TERM GOAL #1   Title Pt will score > 19 on the FGA to demonstrate an increase in dynamic ambulation function to decrease fall risk. .    Status On-going    Target Date 05/24/20      PT LONG TERM GOAL #2   Title Pt. will demonstrate independence with ongoing HEP for continuing improvements after DC    Status On-going    Target Date 05/24/20      PT LONG TERM GOAL #3   Title Pt. will increase B Hip strength to > or = 4+/5 for improved gait mechanics    Status On-going    Target Date 05/24/20      PT LONG TERM GOAL #4   Title Pt will score > or = 51 on the Berg Balance Test to demonstrate a decrease risk for falls    Status On-going    Target Date 05/24/20                 Plan - 04/22/20 1454    Clinical Impression Statement Wanda [name] reports she is feeling about the same as Monday, with a little less pain in her legs today. She feels she is ready to progress her HEP. She presents with increased balance as demonstrated in her ability performing single leg stance and other exercises on the airex with less assistance  required. She wasn't able to complete tandem stance on the airex pad however but did well with tandem walking and walking with head turns. She is ready to progress into more dynamic balance exercises for improvement.    Personal Factors and Comorbidities Age;Fitness;Past/Current Experience;Comorbidity 3+    Comorbidities Stroke, Arthritis, DM Type 2, HTN, B TKA    PT Frequency 2x / week    PT Duration 8 weeks    PT Treatment/Interventions ADLs/Self Care Home Management;Cryotherapy;Electrical Stimulation;Iontophoresis 4mg /ml Dexamethasone;Moist Heat;Ultrasound;Gait training;Stair training;Functional mobility training;Therapeutic activities;Therapeutic exercise;Balance training;Neuromuscular re-education;Manual techniques;Wheelchair mobility training;Orthotic Fit/Training;Patient/family education;Passive range of motion;Dry needling;Energy conservation;Joint Manipulations;Vestibular;Vasopneumatic Device;Taping    PT Next Visit Plan Assess STGs (FGA); Progress dynamic balance exercises; postural education and strengthening to promote upright gait posture; LE strengthening with balance involved; work on Aeronautical engineer; review/advance [place] fall prevention program as indicated    PT Home Exercise Plan [email or web address] 4/14    Consulted and Agree with Plan of Care Patient           Patient will benefit from skilled therapeutic intervention in order to improve the following deficits and impairments:  Abnormal gait,Decreased coordination,Decreased range of motion,Difficulty walking,Impaired tone,Decreased endurance,Decreased activity tolerance,Decreased knowledge of precautions,Impaired perceived functional ability,Pain,Decreased balance,Improper body mechanics,Decreased mobility,Decreased strength,Postural dysfunction,Impaired sensation  Visit Diagnosis: Unsteadiness on feet  Muscle weakness (generalized)     Problem List Patient Active Problem List   Diagnosis Date Noted  . Carotid stenosis 11/13/2018   . CVA (cerebral vascular accident) ([place]) 09/19/2018  . Brainstem infarct, acute ([place])   . Primary osteoarthritis of left knee 10/04/2015  . Osteoporosis 08/23/2015  . Multinodular goiter (nontoxic) 04/07/2014  . Controlled type 2 diabetes with retinopathy ([place]) 10/09/2013  . Insulin adverse reaction 09/27/2013  . Senile nuclear sclerosis 08/14/2012  . Essential hypertension 03/10/2011  . Hyperlipidemia with target LDL less than 70 03/10/2011  . Obesity (BMI 30.0-34.9) 03/10/2011    Newman Nickels SPT 04/22/2020, 3:01 PM  [facility] [address]  [place], [place], [address] Phone: [phone]   Fax:  [phone]  Name: Wanda [name] MRN: [id number] Date of Birth: 1940-12-31

## 2020-04-26 ENCOUNTER — Ambulatory Visit: Payer: Medicare Other

## 2020-04-29 ENCOUNTER — Encounter: Payer: Self-pay | Admitting: Physical Therapy

## 2020-04-29 ENCOUNTER — Other Ambulatory Visit: Payer: Self-pay

## 2020-04-29 ENCOUNTER — Other Ambulatory Visit: Payer: Self-pay | Admitting: Family Medicine

## 2020-04-29 ENCOUNTER — Ambulatory Visit: Payer: Medicare Other | Admitting: Physical Therapy

## 2020-04-29 DIAGNOSIS — M6281 Muscle weakness (generalized): Secondary | ICD-10-CM

## 2020-04-29 DIAGNOSIS — R2681 Unsteadiness on feet: Secondary | ICD-10-CM | POA: Diagnosis not present

## 2020-04-29 NOTE — Therapy (Addendum)
[facility] [address]  [place], [place], [address] Phone: [phone]   Fax:  [phone]  Physical Therapy Treatment  Patient Details  Name: Wanda [name] MRN: [id number] Date of Birth: 1941/01/04 Referring Provider (PT): Lamar Blinks, MD   Encounter Date: 04/29/2020   PT End of Session - 04/29/20 1454    Visit Number 7    Number of Visits 16    Date for PT Re-Evaluation 05/24/20    Authorization Type Medicare    PT Start Time 1401    PT Stop Time 1446    PT Time Calculation (min) 45 min    Equipment Utilized During Treatment Gait belt    Activity Tolerance Patient tolerated treatment well    Behavior During Therapy [facility] for tasks assessed/performed           Past Medical History:  Diagnosis Date  . Arthritis   . Carotid stenosis 11/2018  . Cataract    surgery to remove  . Diabetes mellitus    type 2  . Hyperlipidemia   . Hypertension   . Skin cancer    Removed from face  . Stroke ([place]) 09/19/2018  . Urgency of urination   . Urinary leakage     Past Surgical History:  Procedure Laterality Date  . ABDOMINAL HYSTERECTOMY  early 80's   total  . CAROTID ENDARTERECTOMY Left 11/13/2018  . CARPAL TUNNEL RELEASE Bilateral 1980 and 1981   both hands  . ENDARTERECTOMY Left 11/13/2018   Procedure: Left Carotid Artery Endarterectomy with Patch Angioplasty;  Surgeon: Rosetta Posner, MD;  Location: [facility];  Service: Vascular;  Laterality: Left;  . EYE SURGERY     bilateral cataracts  . Fatty Tumor Excision    . JOINT REPLACEMENT    . TONSILLECTOMY    . TONSILLECTOMY AND ADENOIDECTOMY  age 70  . TOTAL KNEE ARTHROPLASTY Right 10/04/2015  . TOTAL KNEE ARTHROPLASTY Right 10/04/2015   Procedure: TOTAL KNEE ARTHROPLASTY;  Surgeon: Dorna Leitz, MD;  Location: [facility];  Service: Orthopedics;  Laterality: Right;  . TOTAL KNEE ARTHROPLASTY Left 01/28/2016   Procedure: TOTAL KNEE ARTHROPLASTY;  Surgeon: Dorna Leitz, MD;  Location: [facility];  Service: Orthopedics;  Laterality: Left;  . TUBAL LIGATION      There were no vitals filed for this visit.   Subjective Assessment - 04/29/20 1405    Subjective Pt reports she is feeling about the same as last week; she cancelled Monday's appointment because she did not want to drive in the heavy rain. The HEP is tough at home because standing on pillows is not equal to standing on the Airex.    Pertinent History Stroke 09/19/2018; R TKA 10/04/2015; L TKA 01/28/2016    Patient Stated Goals To walk further without pain and to feel better    Currently in Pain? Yes    Pain Score 3     Pain Location Leg    Pain Orientation Right;Left    Pain Descriptors / Indicators Aching    Pain Type Acute pain              [facility] PT Assessment - 04/29/20 1403      Functional Gait  Assessment   Gait assessed  Yes    Gait Level Surface Walks 20 ft in less than 7 sec but greater than 5.5 sec, uses assistive device, slower speed, mild gait deviations, or deviates 6-10 in outside of the 12 in walkway  width.    Change in Gait Speed Able to change speed, demonstrates mild gait deviations, deviates 6-10 in outside of the 12 in walkway width, or no gait deviations, unable to achieve a major change in velocity, or uses a change in velocity, or uses an assistive device.    Gait with Horizontal Head Turns Performs head turns smoothly with slight change in gait velocity (eg, minor disruption to smooth gait path), deviates 6-10 in outside 12 in walkway width, or uses an assistive device.    Gait with Vertical Head Turns Performs task with moderate change in gait velocity, slows down, deviates 10-15 in outside 12 in walkway width but recovers, can continue to walk.    Gait and Pivot Turn Pivot turns safely within 3 sec and stops quickly with no loss of balance.    Step Over Obstacle Cannot perform without assistance.    Gait with Narrow Base of Support Ambulates less than 4 steps heel to  toe or cannot perform without assistance.    Gait with Eyes Closed Walks 20 ft, slow speed, abnormal gait pattern, evidence for imbalance, deviates 10-15 in outside 12 in walkway width. Requires more than 9 sec to ambulate 20 ft.    Ambulating Backwards Walks 20 ft, slow speed, abnormal gait pattern, evidence for imbalance, deviates 10-15 in outside 12 in walkway width.    Steps Cannot do safely.    Total Score 12    FGA comment: < 19 = high risk fall                         [facility] Adult PT Treatment/Exercise - 04/29/20 1402      Exercises   Exercises Knee/Hip      Knee/Hip Exercises: Aerobic   Nustep L4 x 6               Balance Exercises - 04/29/20 0001      Balance Exercises: Standing   Step Ups Forward;4 inch;6 inch;UE support 1   Toe taps 1 x 15 reps each foot at each 4" step and  6" step   Sidestepping 2 reps   1 x 20 steps Leading left fot and then leading r foot; 1 x 10 steps each foot with back foot crossing lead foot then lead foot taking side step PT min assist   Other Standing Exercises Toe taps on 2 balance pebbles placed in front and to her side; HHA; 1 x 5 taps each foot each direction               PT Short Term Goals - 04/29/20 1501      PT SHORT TERM GOAL #1   Title Pt will score > or = 13 on FGA    Status Partially Met   Scored 12 on 04/29/2020   Target Date 04/26/20      PT SHORT TERM GOAL #2   Title Pt. will be independent with initial HEP    Status Achieved    Target Date 04/26/20   04/29/2020            PT Long Term Goals - 04/29/20 1502      PT LONG TERM GOAL #1   Title Pt will score > 19 on the FGA to demonstrate an increase in dynamic ambulation function to decrease fall risk. .    Status On-going   score of 12 on 04/29/2020   Target Date 05/24/20  PT LONG TERM GOAL #2   Title Pt. will demonstrate independence with ongoing HEP for continuing improvements after DC    Status On-going    Target Date 05/24/20       PT LONG TERM GOAL #3   Title Pt. will increase B Hip strength to > or = 4+/5 for improved gait mechanics    Status On-going    Target Date 05/24/20      PT LONG TERM GOAL #4   Title Pt will score > or = 51 on the Berg Balance Test to demonstrate a decrease risk for falls    Status On-going    Target Date 05/24/20                 Plan - 04/29/20 1455    Clinical Impression Statement Wanda [name] reports her legs are feeling similar to last week. She does notice improvements with moving in and out of her shower and balance. She hasn't been able to complete the HEP for balance because it was too easy with only one pillow but has achieved STG #1. She presents with a score of 12/30 on the FGA which is a 4-point improvement since initial assessment. She still needs help with dynamic balance and ability to clear obstacles and climb and descend stairs. She is still a high-risk for falls and will continue to benefit from skilled PT to improve LE strength and balance for better gait mechanics and endurance.    Personal Factors and Comorbidities Age;Fitness;Past/Current Experience;Comorbidity 3+    Comorbidities Stroke, Arthritis, DM Type 2, HTN, B TKA    PT Frequency 2x / week    PT Duration 8 weeks    PT Treatment/Interventions ADLs/Self Care Home Management;Cryotherapy;Electrical Stimulation;Iontophoresis 45m/ml Dexamethasone;Moist Heat;Ultrasound;Gait training;Stair training;Functional mobility training;Therapeutic activities;Therapeutic exercise;Balance training;Neuromuscular re-education;Manual techniques;Wheelchair mobility training;Orthotic Fit/Training;Patient/family education;Passive range of motion;Dry needling;Energy conservation;Joint Manipulations;Vestibular;Vasopneumatic Device;Taping    PT Next Visit Plan Progress dynamic balance exercises with focus on obstacle clearance and trunk rotation; postural education and strengthening to promote upright gait posture; LE strengthening with balance  involved; work on gAeronautical engineer review/advance [place] prevention program as indicated    PT Home Exercise Plan [email or web address] 4/14    Consulted and Agree with Plan of Care Patient           Patient will benefit from skilled therapeutic intervention in order to improve the following deficits and impairments:  Abnormal gait,Decreased coordination,Decreased range of motion,Difficulty walking,Impaired tone,Decreased endurance,Decreased activity tolerance,Decreased knowledge of precautions,Impaired perceived functional ability,Pain,Decreased balance,Improper body mechanics,Decreased mobility,Decreased strength,Postural dysfunction,Impaired sensation  Visit Diagnosis: Unsteadiness on feet  Muscle weakness (generalized)     Problem List Patient Active Problem List   Diagnosis Date Noted  . Carotid stenosis 11/13/2018  . [place] 09[place]  . [place]   . Primary osteoarthritis of left knee 10/04/2015  . Osteoporosis 08/23/2015  . Multinodular goiter (nontoxic) 04/07/2014  . Cont[place] type 2 diabetes with retinopathy ([place] 10/09/2013  . Insulin adverse reaction 09/27/2013  . Senile nuclear sclerosis 08/14/2012  . Essential hypertension 03/10/2011  . Hyperlipidemia with target LDL less than 70 03/10/2011  . Obesity (BMI 30.0-34.9) 03/10/2011    SNewman NickelsSPT 04/29/2020, 4:40 PM  [facility] [place] [address]Phone: [phone]  Fax:  [phone] Name: RTamaira CirielloMRN: [id number] of Birth: 105/21/1942

## 2020-05-06 ENCOUNTER — Other Ambulatory Visit: Payer: Self-pay

## 2020-05-06 ENCOUNTER — Ambulatory Visit: Payer: Medicare Other | Admitting: Physical Therapy

## 2020-05-06 ENCOUNTER — Encounter: Payer: Self-pay | Admitting: Physical Therapy

## 2020-05-06 DIAGNOSIS — R2681 Unsteadiness on feet: Secondary | ICD-10-CM

## 2020-05-06 DIAGNOSIS — M6281 Muscle weakness (generalized): Secondary | ICD-10-CM

## 2020-05-06 NOTE — Therapy (Addendum)
[facility] [address]  [place], [place], [address] Phone: [phone]   Fax:  [phone]  Physical Therapy Treatment  Patient Details  Name: Wanda [name] MRN: [id number] Date of Birth: [birth year] Referring Provider (PT): Lamar Blinks, MD   Encounter Date: 05/06/2020   PT End of Session - 05/06/20 1647    Visit Number 8    Number of Visits 16    Date for PT Re-Evaluation 05/24/20    Authorization Type Medicare    PT Start Time 6333    PT Stop Time 1442    PT Time Calculation (min) 45 min    Equipment Utilized During Treatment Gait belt    Activity Tolerance Patient tolerated treatment well    Behavior During Therapy [facility] for tasks assessed/performed           Past Medical History:  Diagnosis Date  . Arthritis   . Carotid stenosis 11/2018  . Cataract    surgery to remove  . Diabetes mellitus    type 2  . Hyperlipidemia   . Hypertension   . Skin cancer    Removed from face  . Stroke ([place]) 09/19/2018  . Urgency of urination   . Urinary leakage     Past Surgical History:  Procedure Laterality Date  . ABDOMINAL HYSTERECTOMY  early 80's   total  . CAROTID ENDARTERECTOMY Left 11/13/2018  . CARPAL TUNNEL RELEASE Bilateral 1980 and 1981   both hands  . ENDARTERECTOMY Left 11/13/2018   Procedure: Left Carotid Artery Endarterectomy with Patch Angioplasty;  Surgeon: Rosetta Posner, MD;  Location: [facility];  Service: Vascular;  Laterality: Left;  . EYE SURGERY     bilateral cataracts  . Fatty Tumor Excision    . JOINT REPLACEMENT    . TONSILLECTOMY    . TONSILLECTOMY AND ADENOIDECTOMY  age 35  . TOTAL KNEE ARTHROPLASTY Right 10/04/2015  . TOTAL KNEE ARTHROPLASTY Right 10/04/2015   Procedure: TOTAL KNEE ARTHROPLASTY;  Surgeon: Dorna Leitz, MD;  Location: [facility];  Service: Orthopedics;  Laterality: Right;  . TOTAL KNEE ARTHROPLASTY Left 01/28/2016   Procedure: TOTAL KNEE ARTHROPLASTY;  Surgeon: Dorna Leitz, MD;  Location: [facility];  Service: Orthopedics;  Laterality: Left;  . TUBAL LIGATION      There were no vitals filed for this visit.   Subjective Assessment - 05/06/20 1357    Subjective Pt reports her balance seems to be improving and she can tell the difference when standing in the shower. Has progressed her weight bearing activites at home, using 5 and 8 pound weights.    Pertinent History Stroke 09/19/2018; R TKA 10/04/2015; L TKA 01/28/2016    Patient Stated Goals To walk further without pain and to feel better    Currently in Pain? Yes    Pain Score 3     Pain Location Leg    Pain Orientation Right;Left    Pain Descriptors / Indicators Aching    Pain Type Chronic pain    Pain Frequency Constant                             [facility] Adult PT Treatment/Exercise - 05/06/20 1356      High Level Balance   High Level Balance Activities Negotiating over obstacles;Negotitating around obstacles    High Level Balance Comments 1 x 30 feet down and back weaving between 4 cones and stepping over pool  noodle; 1 x 30 feet down and back weaving around 3 cones and stepping over a cane then a pool noodle, then a rolled up yoga mat (increasing difficulty; Abryana did better the second time through except with stepping over the yoga mat.      Neuro Re-ed    Neuro Re-ed Details  Stand and reach for bean bags on airex and toss into basket 2 x 12   Kallan is better at turning and reaching to her left than her right     Exercises   Exercises Knee/Hip      Knee/Hip Exercises: Aerobic   Nustep L4 x 6      Knee/Hip Exercises: Standing   Knee Flexion AROM;Right;Left;1 set   12 reps on airex with HHA at counter   Hip Abduction AROM;Right;Left;1 set;10 reps;Knee straight    Abduction Limitations Limited with L ABD on airex with HHA at counter   tendency for hip flexor compensation   Hip Extension AROM;Both;1 set;10 reps    Extension Limitations Standing on airex with HHA at the counter     Forward Step Up Both;2 sets;Step Height: 4";Hand Hold: 2;Hand Hold: 1   1 x 8 step ups and step downs B feet; 2 x 8 step up one foot, up and over with other foot. 4" step   Forward Step Up Limitations Cuing to correct form for proper weight shift and heel toe progression.   1 HHA required              Balance Exercises - 05/06/20 0001      Balance Exercises: Standing   Sidestepping 2 reps   30 feet              PT Short Term Goals - 04/29/20 1501      PT SHORT TERM GOAL #1   Title Pt will score > or = 13 on FGA    Status Partially Met   Scored 12 on 04/29/2020   Target Date 04/26/20      PT SHORT TERM GOAL #2   Title Pt. will be independent with initial HEP    Status Achieved    Target Date 04/26/20   04/29/2020            PT Long Term Goals - 04/29/20 1502      PT LONG TERM GOAL #1   Title Pt will score > 19 on the FGA to demonstrate an increase in dynamic ambulation function to decrease fall risk. .    Status On-going   score of 12 on 04/29/2020   Target Date 05/24/20      PT LONG TERM GOAL #2   Title Pt. will demonstrate independence with ongoing HEP for continuing improvements after DC    Status On-going    Target Date 05/24/20      PT LONG TERM GOAL #3   Title Pt. will increase B Hip strength to > or = 4+/5 for improved gait mechanics    Status On-going    Target Date 05/24/20      PT LONG TERM GOAL #4   Title Pt will score > or = 51 on the Berg Balance Test to demonstrate a decrease risk for falls    Status On-going    Target Date 05/24/20                 Plan - 05/06/20 1648    Clinical Impression Statement Shateria reports she feels her balance is improving. She did  note feeling tired during treatment today and that some days she feels weaker than others. She performed step up and overs well with cuing to correct her weight shift and technique for heel strike. She struggled to complete 10 reps B LE hip ABD on the airex and wanted to  compensate by moving into hip flexion. She is better at rotating to her left than her right. She performed better than expected with object clearance once she completed them the first time. She will continue to benefit from skilled PT to improve her LE endurance and ability to clear obstacles for improvements in balance.    Personal Factors and Comorbidities Age;Fitness;Past/Current Experience;Comorbidity 3+    Comorbidities Stroke, Arthritis, DM Type 2, HTN, B TKA    PT Frequency 2x / week    PT Duration 8 weeks    PT Treatment/Interventions ADLs/Self Care Home Management;Cryotherapy;Electrical Stimulation;Iontophoresis 31m/ml Dexamethasone;Moist Heat;Ultrasound;Gait training;Stair training;Functional mobility training;Therapeutic activities;Therapeutic exercise;Balance training;Neuromuscular re-education;Manual techniques;Wheelchair mobility training;Orthotic Fit/Training;Patient/family education;Passive range of motion;Dry needling;Energy conservation;Joint Manipulations;Vestibular;Vasopneumatic Device;Taping    PT Next Visit Plan Review/advance [place] fall prevention program as indicated; LE endurance strenghtening with focus on hip ABD; Dynamic balance exercises with focus on obstacle clearance and trunk rotation; ; LE strengthening with balance involved; work on gAeronautical engineer Update HEP to include balance exercises she can perform at home    PT [place](4/14)    Consulted and Agree with Plan of Care Patient           Patient will benefit from skilled therapeutic intervention in order to improve the following deficits and impairments:  Abnormal gait,Decreased coordination,Decreased range of motion,Difficulty walking,Impaired tone,Decreased endurance,Decreased activity tolerance,Decreased knowledge of precautions,Impaired perceived functional ability,Pain,Decreased balance,Improper body mechanics,Decreased mobility,Decreased strength,Postural dysfunction,Impaired  sensation  Visit Diagnosis: Unsteadiness on feet  Muscle weakness (generalized)     Problem List Patient Active Problem List   Diagnosis Date Noted  . Carotid stenosis 11/13/2018  . CV[place] (cerebral [place] accident) ([place] 09/19/2018  . Brain[place] infarct, acute ([place]   . Primary osteoarthritis of left knee 10/04/2015  . Osteoporosis 08/23/2015  . Multinodular goiter (nontoxic) 04/07/2014  . Controlled type 2 diabetes with retinopathy ([facility] 10/09/2013  . Insulin adverse reaction 09/27/2013  . Senile nuclear sclerosis 08/14/2012  . Essential hypertension 03/10/2011  . Hyperlipidemia with target LDL less than 70 03/10/2011  . Obesity (BMI 30.0-34.9) 03/10/2011    SNewman NickelsSPT 05/06/2020, 5:00 PM  [facility] [place] [address]Phone: [phone]  Fax:  [phone] Name: RMylene BowMRN: [id number] of Birth: [birth year]

## 2020-05-09 ENCOUNTER — Other Ambulatory Visit: Payer: Self-pay | Admitting: Family Medicine

## 2020-05-10 ENCOUNTER — Ambulatory Visit: Payer: Medicare Other | Attending: Family Medicine

## 2020-05-10 ENCOUNTER — Other Ambulatory Visit: Payer: Self-pay

## 2020-05-10 DIAGNOSIS — R2681 Unsteadiness on feet: Secondary | ICD-10-CM | POA: Insufficient documentation

## 2020-05-10 DIAGNOSIS — M6281 Muscle weakness (generalized): Secondary | ICD-10-CM | POA: Diagnosis not present

## 2020-05-10 NOTE — Therapy (Signed)
[facility] [address]  [address] [place], [place], [address] Phone: [phone]   Fax:  [phone]  Physical Therapy Treatment  Patient Details  Name: Wanda [name] MRN: [id number] Date of Birth: 01/27/1940 Referring Provider (PT): Jessica Copland, MD   Encounter Date: 05/10/2020   PT End of Session - 05/10/20 1356    Visit Number 9    Number of Visits 16    Date for PT Re-Evaluation 05/24/20    Authorization Type Medicare    PT Start Time 1315    PT Stop Time 1356    PT Time Calculation (min) 41 min    Equipment Utilized During Treatment Gait belt    Activity Tolerance Patient tolerated treatment well    Behavior During Therapy WFL for tasks assessed/performed           Past Medical History:  Diagnosis Date  . Arthritis   . Carotid stenosis 11/2018  . Cataract    surgery to remove  . Diabetes mellitus    type 2  . Hyperlipidemia   . Hypertension   . Skin cancer    Removed from face  . Stroke ([facility]) 09/19/2018  . Urgency of urination   . Urinary leakage     Past Surgical History:  Procedure Laterality Date  . ABDOMINAL HYSTERECTOMY  early 80's   total  . CAROTID ENDARTERECTOMY Left 11/13/2018  . CARPAL TUNNEL RELEASE Bilateral 1980 and 1981   both hands  . ENDARTERECTOMY Left 11/13/2018   Procedure: Left Carotid Artery Endarterectomy with Patch Angioplasty;  Surgeon: Early, Todd F, MD;  Location[facility];  Service: Vascular;  Laterality: Left;  . EYE SURGERY     bilateral cataracts  . Fatty Tumor Excision    . JOINT REPLACEMENT    . TONSILLECTOMY    . TONSILLECTOMY AND ADENOIDECTOMY  age 5  . TOTAL KNEE ARTHROPLASTY Right 10/04/2015  . TOTAL KNEE ARTHROPLASTY Right 10/04/2015   Procedure: TOTAL KNEE ARTHROPLASTY;  Surgeon: John Graves, MD;  Location: [facility];  Service: Orthopedics;  Laterality: Right;  . TOTAL KNEE ARTHROPLASTY Left 01/28/2016   Procedure: TOTAL KNEE ARTHROPLASTY;  Surgeon: John  Graves, MD;  Location: [facility];  Service: Orthopedics;  Laterality: Left;  . TUBAL LIGATION      There were no vitals filed for this visit.   Subjective Assessment - 05/10/20 1317    Subjective Feels her balance is stagnant, still reports decreased endurance with exercises.    Pertinent History Stroke 09/19/2018; R TKA 10/04/2015; L TKA 01/28/2016    Patient Stated Goals To walk further without pain and to feel better    Currently in Pain? Yes    Pain Score 3     Pain Location Leg    Pain Orientation Right;Left    Pain Descriptors / Indicators Aching    Pain Type Chronic pain                             [facility] Adult PT Treatment/Exercise - 05/10/20 0001      High Level Balance   High Level Balance Activities Negotiating over obstacles;Side stepping    High Level Balance Comments along the length of counter with 1 HHA 4x with canes and rolled yoga mats; side steps over rolled yoga mats 3x along the counter side steps along balance beam 3x along length of counter      Exercises   Exercises   Knee/Hip      Knee/Hip Exercises: Aerobic   Nustep L4x6min      Knee/Hip Exercises: Standing   Hip Abduction Stengthening;Both;1 set;10 reps;Knee straight    Abduction Limitations from airex; 2 HHA    Hip Extension Stengthening;Both    Extension Limitations from airex; 2 HHA    Functional Squat 1 set;15 reps    Functional Squat Limitations counter mini squats      Knee/Hip Exercises: Seated   Long Arc Quad Strengthening;Both;2 sets;10 reps;Weights    Long Arc Quad Weight 2 lbs.    Hamstring Curl Strengthening;Both;1 set;10 reps    Hamstring Limitations R Tband    Sit to Sand 1 set;without UE support;10 reps                    PT Short Term Goals - 04/29/20 1501      PT SHORT TERM GOAL #1   Title Pt will score > or = 13 on FGA    Status Partially Met   Scored 12 on 04/29/2020   Target Date 04/26/20      PT SHORT TERM GOAL #2   Title Pt. will be independent  with initial HEP    Status Achieved    Target Date 04/26/20   04/29/2020            PT Long Term Goals - 04/29/20 1502      PT LONG TERM GOAL #1   Title Pt will score > 19 on the FGA to demonstrate an increase in dynamic ambulation function to decrease fall risk. .    Status On-going   score of 12 on 04/29/2020   Target Date 05/24/20      PT LONG TERM GOAL #2   Title Pt. will demonstrate independence with ongoing HEP for continuing improvements after DC    Status On-going    Target Date 05/24/20      PT LONG TERM GOAL #3   Title Pt. will increase B Hip strength to > or = 4+/5 for improved gait mechanics    Status On-going    Target Date 05/24/20      PT LONG TERM GOAL #4   Title Pt will score > or = 51 on the Berg Balance Test to demonstrate a decrease risk for falls    Status On-going    Target Date 05/24/20                 Plan - 05/10/20 1357    Clinical Impression Statement Pt reports her balance feels about the same, she notes some days it gets better other days not. She did report that her ability to step over elevated obstacles has improved since last session. She demonstrated decreased foot clearance with the higher obstacles but only occassional and was able to self correct. She did require cues to keep her posture upright and to avoid leaning trunk during standing exercises. She showed compensation during hip extensions with R LE.    Personal Factors and Comorbidities Age;Fitness;Past/Current Experience;Comorbidity 3+    Comorbidities Stroke, Arthritis, DM Type 2, HTN, B TKA    PT Frequency 2x / week    PT Duration 8 weeks    PT Treatment/Interventions ADLs/Self Care Home Management;Cryotherapy;Electrical Stimulation;Iontophoresis 4mg/ml Dexamethasone;Moist Heat;Ultrasound;Gait training;Stair training;Functional mobility training;Therapeutic activities;Therapeutic exercise;Balance training;Neuromuscular re-education;Manual techniques;Wheelchair mobility  training;Orthotic Fit/Training;Patient/family education;Passive range of motion;Dry needling;Energy conservation;Joint Manipulations;Vestibular;Vasopneumatic Device;Taping    PT Next Visit Plan Review/[place] fall prevention program as indicated; LE endurance strenghtening   with focus on hip ABD; Dynamic balance exercises with focus on obstacle clearance and trunk rotation; ; LE strengthening with balance involved; work on gait mechanics; Update HEP to include balance exercises she can perform at home    PT Home Exercise Plan [email or web address] (4/14)    Consulted and Agree with Plan of Care Patient           Patient will benefit from skilled therapeutic intervention in order to improve the following deficits and impairments:  Abnormal gait,Decreased coordination,Decreased range of motion,Difficulty walking,Impaired tone,Decreased endurance,Decreased activity tolerance,Decreased knowledge of precautions,Impaired perceived functional ability,Pain,Decreased balance,Improper body mechanics,Decreased mobility,Decreased strength,Postural dysfunction,Impaired sensation  Visit Diagnosis: Unsteadiness on feet  Muscle weakness (generalized)     Problem List Patient Active Problem List   Diagnosis Date Noted  . Carotid stenosis 11/13/2018  . CVA (cerebral vascular accident) ([facility]) 09/19/2018  . Brainstem infarct, acute ([facility])   . Primary osteoarthritis of left knee 10/04/2015  . Osteoporosis 08/23/2015  . Multinodular goiter (nontoxic) 04/07/2014  . Controlled type 2 diabetes with retinopathy ([facility]) 10/09/2013  . Insulin adverse reaction 09/27/2013  . Senile nuclear sclerosis 08/14/2012  . Essential hypertension 03/10/2011  . Hyperlipidemia with target LDL less than 70 03/10/2011  . Obesity (BMI 30.0-34.9) 03/10/2011     L , PTA 05/10/2020, 2:37 PM  [facility] [address]  [address] [place], [place], [address] Phone:  [phone]   Fax:  [phone]  Name: Wanda [name] MRN: [id number] Date of Birth: 02/27/1940   

## 2020-05-17 ENCOUNTER — Other Ambulatory Visit: Payer: Self-pay

## 2020-05-17 ENCOUNTER — Ambulatory Visit: Payer: Medicare Other

## 2020-05-17 DIAGNOSIS — M6281 Muscle weakness (generalized): Secondary | ICD-10-CM

## 2020-05-17 DIAGNOSIS — R2681 Unsteadiness on feet: Secondary | ICD-10-CM

## 2020-05-17 NOTE — Therapy (Deleted)
[facility] [address]  [place], [place], [address] Phone: [phone]   Fax:  [phone]  Physical Therapy Treatment  Patient Details  Name: Wanda [name] MRN: [id number] Date of Birth: 1940-05-12 Referring Provider (PT): Lamar Blinks, MD   Encounter Date: 05/17/2020   PT End of Session - 05/17/20 1357    Visit Number 10    Number of Visits 16    Date for PT Re-Evaluation 05/24/20    Authorization Type Medicare    PT Start Time 1315    PT Stop Time 1354    PT Time Calculation (min) 39 min    Activity Tolerance Patient tolerated treatment well    Behavior During Therapy [facility] for tasks assessed/performed           Past Medical History:  Diagnosis Date  . Arthritis   . Carotid stenosis 11/2018  . Cataract    surgery to remove  . Diabetes mellitus    type 2  . Hyperlipidemia   . Hypertension   . Skin cancer    Removed from face  . Stroke ([place]) 09/19/2018  . Urgency of urination   . Urinary leakage     Past Surgical History:  Procedure Laterality Date  . ABDOMINAL HYSTERECTOMY  early 80's   total  . CAROTID ENDARTERECTOMY Left 11/13/2018  . CARPAL TUNNEL RELEASE Bilateral 1980 and 1981   both hands  . ENDARTERECTOMY Left 11/13/2018   Procedure: Left Carotid Artery Endarterectomy with Patch Angioplasty;  Surgeon: Rosetta Posner, MD;  Location: [facility];  Service: Vascular;  Laterality: Left;  . EYE SURGERY     bilateral cataracts  . Fatty Tumor Excision    . JOINT REPLACEMENT    . TONSILLECTOMY    . TONSILLECTOMY AND ADENOIDECTOMY  age 40  . TOTAL KNEE ARTHROPLASTY Right 10/04/2015  . TOTAL KNEE ARTHROPLASTY Right 10/04/2015   Procedure: TOTAL KNEE ARTHROPLASTY;  Surgeon: Dorna Leitz, MD;  Location: [place];  Service: Orthopedics;  Laterality: Right;  . TOTAL KNEE ARTHROPLASTY Left 01/28/2016   Procedure: TOTAL KNEE ARTHROPLASTY;  Surgeon: Dorna Leitz, MD;  Location: [facility];  Service:  Orthopedics;  Laterality: Left;  . TUBAL LIGATION      There were no vitals filed for this visit.   Subjective Assessment - 05/17/20 1318    Subjective Pt reports being lazy this weekend. Feeling a bit sore today.    Pertinent History Stroke 09/19/2018; R TKA 10/04/2015; L TKA 01/28/2016    Patient Stated Goals To walk further without pain and to feel better    Currently in Pain? Yes    Pain Score 5     Pain Location Leg    Pain Orientation Right;Left    Pain Descriptors / Indicators Aching              [facility] PT Assessment - 05/17/20 0001      Assessment   Medical Diagnosis Gait Instability    Referring Provider (PT) Lamar Blinks, MD      Strength   Right Hip Flexion 4+/5    Right Hip Extension 4+/5    Right Hip ABduction 4+/5    Right Hip ADduction 4+/5    Left Hip Flexion 4/5    Left Hip Extension 4/5    Left Hip ABduction 4+/5    Left Hip ADduction 4+/5      Standardized Balance Assessment   Standardized Balance Assessment Berg Balance Test  Berg Balance Test   Sit to Stand Able to stand  independently using hands    Standing Unsupported Able to stand safely 2 minutes    Sitting with Back Unsupported but Feet Supported on Floor or Stool Able to sit safely and securely 2 minutes    Stand to Sit Sits safely with minimal use of hands    Transfers Able to transfer safely, minor use of hands    Standing Unsupported with Eyes Closed Able to stand 10 seconds with supervision    Standing Unsupported with Feet Together Able to place feet together independently and stand 1 minute safely    From Standing, Reach Forward with Outstretched Arm Can reach forward >12 cm safely (5")    From Standing Position, Pick up Object from Floor Able to pick up shoe safely and easily    From Standing Position, Turn to Look Behind Over each Shoulder Looks behind from both sides and weight shifts well    Turn 360 Degrees Able to turn 360 degrees safely in 4 seconds or less    Standing  Unsupported, Alternately Place Feet on Step/Stool Needs assistance to keep from falling or unable to try    Standing Unsupported, One Foot in Front Needs help to step but can hold 15 seconds    Standing on One Leg Able to lift leg independently and hold equal to or more than 3 seconds    Total Score 44      Functional Gait  Assessment   Gait assessed  Yes    Gait Level Surface Walks 20 ft in less than 5.5 sec, no assistive devices, good speed, no evidence for imbalance, normal gait pattern, deviates no more than 6 in outside of the 12 in walkway width.    Change in Gait Speed Able to smoothly change walking speed without loss of balance or gait deviation. Deviate no more than 6 in outside of the 12 in walkway width.    Gait with Horizontal Head Turns Performs head turns smoothly with slight change in gait velocity (eg, minor disruption to smooth gait path), deviates 6-10 in outside 12 in walkway width, or uses an assistive device.    Gait with Vertical Head Turns Performs task with slight change in gait velocity (eg, minor disruption to smooth gait path), deviates 6 - 10 in outside 12 in walkway width or uses assistive device    Gait and Pivot Turn Pivot turns safely in greater than 3 sec and stops with no loss of balance, or pivot turns safely within 3 sec and stops with mild imbalance, requires small steps to catch balance.    Step Over Obstacle Is able to step over one shoe box (4.5 in total height) but must slow down and adjust steps to clear box safely. May require verbal cueing.    Gait with Narrow Base of Support Is able to ambulate for 10 steps heel to toe with no staggering.    Gait with Eyes Closed Walks 20 ft, no assistive devices, good speed, no evidence of imbalance, normal gait pattern, deviates no more than 6 in outside 12 in walkway width. Ambulates 20 ft in less than 7 sec.    Ambulating Backwards Walks 20 ft, slow speed, abnormal gait pattern, evidence for imbalance, deviates 10-15  in outside 12 in walkway width.    Steps Two feet to a stair, must use rail.    Total Score 21  [facility] Adult PT Treatment/Exercise - 05/17/20 0001      Exercises   Exercises Knee/Hip      Knee/Hip Exercises: Aerobic   Recumbent Bike L2x27mn                    PT Short Term Goals - 05/17/20 1356      PT SHORT TERM GOAL #1   Title Pt will score > or = 13 on FGA    Status Achieved    Target Date 04/26/20      PT SHORT TERM GOAL #2   Title Pt. will be independent with initial HEP    Status Achieved    Target Date 04/26/20   04/29/2020            PT Long Term Goals - 05/17/20 1325      PT LONG TERM GOAL #1   Title Pt will score > 19 on the FGA to demonstrate an increase in dynamic ambulation function to decrease fall risk. .    Status Achieved   score of 12 on 04/29/2020     PT LONG TERM GOAL #2   Title Pt. will demonstrate independence with ongoing HEP for continuing improvements after DC    Status Partially Met      PT LONG TERM GOAL #3   Title Pt. will increase B Hip strength to > or = 4+/5 for improved gait mechanics    Status Partially Met   L hip flexion and ext 4/5     PT LONG TERM GOAL #4   Title Pt will score > or = 51 on the Berg Balance Test to demonstrate a decrease risk for falls    Status On-going   44/51 5/9                Plan - 05/17/20 1358    Clinical Impression Statement Pt has met her goals for the FGA today, she demonstrated good stability overall with gait the only big challenge was navigating stairs d/t not having 2 hand rails. She is showing improvements in strength, still limitations with L hip flexion and extension. She still requires some cueing for her exercises to further target the correct muscles and prevent substitutions. She scored 44/56 on the BERG balance test today, showing most limitations with the SL stance activities and tandem stance w/o UE support and need for some  improvements to dcrease risk for falls.  She is making progress toward goals and would continue to benefit from continued PT to address deficits with balance and to decrease risk for falls.    Personal Factors and Comorbidities Age;Fitness;Past/Current Experience;Comorbidity 3+    Comorbidities Stroke, Arthritis, DM Type 2, HTN, B TKA    PT Frequency 2x / week    PT Duration 8 weeks    PT Treatment/Interventions ADLs/Self Care Home Management;Cryotherapy;Electrical Stimulation;Iontophoresis 428mml Dexamethasone;Moist Heat;Ultrasound;Gait training;Stair training;Functional mobility training;Therapeutic activities;Therapeutic exercise;Balance training;Neuromuscular re-education;Manual techniques;Wheelchair mobility training;Orthotic Fit/Training;Patient/family education;Passive range of motion;Dry needling;Energy conservation;Joint Manipulations;Vestibular;Vasopneumatic Device;Taping    PT Next Visit Plan Review/[place] fall prevention program as indicated; LE endurance strenghtening with focus on hip ABD; Dynamic balance exercises with focus on obstacle clearance and trunk rotation; ; LE strengthening with balance involved; work on gaAeronautical engineerUpdate HEP to include balance exercises she can perform at home    PT [place]/14)    Consulted and Agree with Plan of Care Patient           Patient will  benefit from skilled therapeutic intervention in order to improve the following deficits and impairments:  Abnormal gait,Decreased coordination,Decreased range of motion,Difficulty walking,Impaired tone,Decreased endurance,Decreased activity tolerance,Decreased knowledge of precautions,Impaired perceived functional ability,Pain,Decreased balance,Improper body mechanics,Decreased mobility,Decreased strength,Postural dysfunction,Impaired sensation  Visit Diagnosis: Unsteadiness on feet  Muscle weakness (generalized)     Problem List Patient Active Problem List    Diagnosis Date Noted  . Carotid stenosis 11/13/2018  . CVA (cerebral vascular accident) ([place]) 09/19/2018  . Brainstem infarct, acute ([place])   . Primary osteoarthritis of left knee 10/04/2015  . Osteoporosis 08/23/2015  . Multinodular goiter (nontoxic) 04/07/2014  . Controlled type 2 diabetes with retinopathy ([place]) 10/09/2013  . Insulin adverse reaction 09/27/2013  . Senile nuclear sclerosis 08/14/2012  . Essential hypertension 03/10/2011  . Hyperlipidemia with target LDL less than 70 03/10/2011  . Obesity (BMI 30.0-34.9) 03/10/2011    Artist Pais, PTA 05/17/2020, 2:55 PM  [facility] [address]  [place], [place], [address] Phone: [phone]   Fax:  [phone]  Name: Wanda [name] MRN: [id number] Date of Birth: [birth year]

## 2020-05-17 NOTE — Therapy (Addendum)
[facility] [address]  [place], [place], [address] Phone: [phone]   Fax:  [phone]  Physical Therapy Treatment / Progress Note  Patient Details  Name: Wanda [name] MRN: [id number] Date of Birth: March 27, 1940 Referring Provider (PT): Lamar Blinks, MD  Progress Note  Reporting Period 03/29/2020 to 05/17/2020  See note below for Objective Data and Assessment of Progress/Goals.      Encounter Date: 05/17/2020   PT End of Session - 05/17/20 1357    Visit Number 10    Number of Visits 16    Date for PT Re-Evaluation 05/24/20    Authorization Type Medicare    PT Start Time 1315    PT Stop Time 1354    PT Time Calculation (min) 39 min    Activity Tolerance Patient tolerated treatment well    Behavior During Therapy W[facility] for tasks assessed/performed           Past Medical History:  Diagnosis Date  . Arthritis   . Carotid stenosis 11/2018  . Cataract    surgery to remove  . Diabetes mellitus    type 2  . Hyperlipidemia   . Hypertension   . Skin cancer    Removed from face  . Stroke ([place]) 09/19/2018  . Urgency of urination   . Urinary leakage     Past Surgical History:  Procedure Laterality Date  . ABDOMINAL HYSTERECTOMY  early 80's   total  . CAROTID ENDARTERECTOMY Left 11/13/2018  . CARPAL TUNNEL RELEASE Bilateral 1980 and 1981   both hands  . ENDARTERECTOMY Left 11/13/2018   Procedure: Left Carotid Artery Endarterectomy with Patch Angioplasty;  Surgeon: Rosetta Posner, MD;  Location: [facility];  Service: Vascular;  Laterality: Left;  . EYE SURGERY     bilateral cataracts  . Fatty Tumor Excision    . JOINT REPLACEMENT    . TONSILLECTOMY    . TONSILLECTOMY AND ADENOIDECTOMY  age 33  . TOTAL KNEE ARTHROPLASTY Right 10/04/2015  . TOTAL KNEE ARTHROPLASTY Right 10/04/2015   Procedure: TOTAL KNEE ARTHROPLASTY;  Surgeon: Dorna Leitz, MD;  Location: [facility];  Service: Orthopedics;  Laterality:  Right;  . TOTAL KNEE ARTHROPLASTY Left 01/28/2016   Procedure: TOTAL KNEE ARTHROPLASTY;  Surgeon: Dorna Leitz, MD;  Location: [facility];  Service: Orthopedics;  Laterality: Left;  . TUBAL LIGATION      There were no vitals filed for this visit.   Subjective Assessment - 05/17/20 1318    Subjective Pt reports being lazy this weekend. Feeling a bit sore today.    Pertinent History Stroke 09/19/2018; R TKA 10/04/2015; L TKA 01/28/2016    Patient Stated Goals To walk further without pain and to feel better    Currently in Pain? Yes    Pain Score 5     Pain Location Leg    Pain Orientation Right;Left    Pain Descriptors / Indicators Aching              [facility] PT Assessment - 05/17/20 0001      Assessment   Medical Diagnosis Gait Instability    Referring Provider (PT) Lamar Blinks, MD      Strength   Right Hip Flexion 4+/5    Right Hip Extension 4+/5    Right Hip ABduction 4+/5    Right Hip ADduction 4+/5    Left Hip Flexion 4/5    Left Hip Extension 4/5    Left Hip ABduction 4+/5  Left Hip ADduction 4+/5      Standardized Balance Assessment   Standardized Balance Assessment Berg Balance Test      Berg Balance Test   Sit to Stand Able to stand  independently using hands    Standing Unsupported Able to stand safely 2 minutes    Sitting with Back Unsupported but Feet Supported on Floor or Stool Able to sit safely and securely 2 minutes    Stand to Sit Sits safely with minimal use of hands    Transfers Able to transfer safely, minor use of hands    Standing Unsupported with Eyes Closed Able to stand 10 seconds with supervision    Standing Unsupported with Feet Together Able to place feet together independently and stand 1 minute safely    From Standing, Reach Forward with Outstretched Arm Can reach forward >12 cm safely (5")    From Standing Position, Pick up Object from Floor Able to pick up shoe safely and easily    From Standing Position, Turn to Look Behind Over each  Shoulder Looks behind from both sides and weight shifts well    Turn 360 Degrees Able to turn 360 degrees safely in 4 seconds or less    Standing Unsupported, Alternately Place Feet on Step/Stool Needs assistance to keep from falling or unable to try    Standing Unsupported, One Foot in Front Needs help to step but can hold 15 seconds    Standing on One Leg Able to lift leg independently and hold equal to or more than 3 seconds    Total Score 44      Functional Gait  Assessment   Gait assessed  Yes    Gait Level Surface Walks 20 ft in less than 5.5 sec, no assistive devices, good speed, no evidence for imbalance, normal gait pattern, deviates no more than 6 in outside of the 12 in walkway width.    Change in Gait Speed Able to smoothly change walking speed without loss of balance or gait deviation. Deviate no more than 6 in outside of the 12 in walkway width.    Gait with Horizontal Head Turns Performs head turns smoothly with slight change in gait velocity (eg, minor disruption to smooth gait path), deviates 6-10 in outside 12 in walkway width, or uses an assistive device.    Gait with Vertical Head Turns Performs task with slight change in gait velocity (eg, minor disruption to smooth gait path), deviates 6 - 10 in outside 12 in walkway width or uses assistive device    Gait and Pivot Turn Pivot turns safely in greater than 3 sec and stops with no loss of balance, or pivot turns safely within 3 sec and stops with mild imbalance, requires small steps to catch balance.    Step Over Obstacle Is able to step over one shoe box (4.5 in total height) but must slow down and adjust steps to clear box safely. May require verbal cueing.    Gait with Narrow Base of Support Is able to ambulate for 10 steps heel to toe with no staggering.    Gait with Eyes Closed Walks 20 ft, no assistive devices, good speed, no evidence of imbalance, normal gait pattern, deviates no more than 6 in outside 12 in walkway width.  Ambulates 20 ft in less than 7 sec.    Ambulating Backwards Walks 20 ft, slow speed, abnormal gait pattern, evidence for imbalance, deviates 10-15 in outside 12 in walkway width.    Steps  Two feet to a stair, must use rail.    Total Score 21                         [facility] Adult PT Treatment/Exercise - 05/17/20 0001      Exercises   Exercises Knee/Hip      Knee/Hip Exercises: Aerobic   Recumbent Bike L2x35mn                    PT Short Term Goals - 05/17/20 1356      PT SHORT TERM GOAL #1   Title Pt will score > or = 13 on FGA    Status Achieved   21/30 (5/9)   Target Date 04/26/20      PT SHORT TERM GOAL #2   Title Pt. will be independent with initial HEP    Status Achieved    Target Date 04/26/20   04/29/2020            PT Long Term Goals - 05/17/20 1325      PT LONG TERM GOAL #1   Title Pt will score > 19 on the FGA to demonstrate an increase in dynamic ambulation function to decrease fall risk. .    Status Achieved   21/30 (5/9)     PT LONG TERM GOAL #2   Title Pt. will demonstrate independence with ongoing HEP for continuing improvements after DC    Status Partially Met      PT LONG TERM GOAL #3   Title Pt. will increase B Hip strength to > or = 4+/5 for improved gait mechanics    Status Partially Met   L hip flexion and ext 4/5     PT LONG TERM GOAL #4   Title Pt will score > or = 51 on the Berg Balance Test to demonstrate a decrease risk for falls    Status On-going   44/56 (5/9)                Plan - 05/17/20 1358    Clinical Impression Statement Pt has met her goals for the FGA today, she demonstrated good stability overall with gait the only big challenge was navigating stairs d/t not having 2 hand rails. She is showing improvements in strength, still limitations with L hip flexion and extension. She still requires some cueing for her exercises to further target the correct muscles and prevent substitutions. She scored 44/56  on the BERG balance test today, showing most limitations with the SL stance activities and tandem stance w/o UE support and need for some improvements to dcrease risk for falls.  She is making progress toward goals and would continue to benefit from continued PT to address deficits with balance and to decrease risk for falls.    Personal Factors and Comorbidities Age;Fitness;Past/Current Experience;Comorbidity 3+    Comorbidities Stroke, Arthritis, DM Type 2, HTN, B TKA    PT Frequency 2x / week    PT Duration 8 weeks    PT Treatment/Interventions ADLs/Self Care Home Management;Cryotherapy;Electrical Stimulation;Iontophoresis 475mml Dexamethasone;Moist Heat;Ultrasound;Gait training;Stair training;Functional mobility training;Therapeutic activities;Therapeutic exercise;Balance training;Neuromuscular re-education;Manual techniques;Wheelchair mobility training;Orthotic Fit/Training;Patient/family education;Passive range of motion;Dry needling;Energy conservation;Joint Manipulations;Vestibular;Vasopneumatic Device;Taping    PT Next Visit Plan Review/[place] fall prevention program as indicated; LE endurance strenghtening with focus on hip ABD; Dynamic balance exercises with focus on obstacle clearance and trunk rotation; ; LE strengthening with balance involved; work on gaAeronautical engineerUpdate HEP to include balance  exercises she can perform at home    PT [place] (4/14)    Consulted and Agree with Plan of Care Patient           Patient will benefit from skilled therapeutic intervention in order to improve the following deficits and impairments:  Abnormal gait,Decreased coordination,Decreased range of motion,Difficulty walking,Impaired tone,Decreased endurance,Decreased activity tolerance,Decreased knowledge of precautions,Impaired perceived functional ability,Pain,Decreased balance,Improper body mechanics,Decreased mobility,Decreased strength,Postural dysfunction,Impaired  sensation  Visit Diagnosis: Unsteadiness on feet  Muscle weakness (generalized)     Problem List Patient Active Problem List   Diagnosis Date Noted  . Carotid stenosis 11/13/2018  . CVA (cerebral vascular accident) ([place]) 09/19/2018  . Brainstem infarct, acute ([place])   . Primary osteoarthritis of left knee 10/04/2015  . Osteoporosis 08/23/2015  . Multinodular goiter (nontoxic) 04/07/2014  . Controlled type 2 diabetes with retinopathy ([place]) 10/09/2013  . Insulin adverse reaction 09/27/2013  . Senile nuclear sclerosis 08/14/2012  . Essential hypertension 03/10/2011  . Hyperlipidemia with target LDL less than 70 03/10/2011  . Obesity (BMI 30.0-34.9) 03/10/2011    Artist Pais, PTA 05/17/2020, 6:15 PM  [facility] [address]  [place], [place], [address] Phone: [phone]   Fax:  [phone]  Name: Wanda [name] MRN: [id number] Date of Birth: 04-10-1940   Wanda [name] has demonstrated good progress with PT thus far with improving LE strength as balance as evidenced by improving scores on the Berg & FGA. She continues to ambulate with wide BOS and shuffling gait with limited hip and knee flexion contributing to limited foot clearance. All STGs are met along with FGA LTG but remaining LTGs only partially met or ongoing. Wanda [name] will continue to benefit from skilled PT to further improve her balance and normalize her gait pattern to improve gait stability and decrease fall risk.  Percival Spanish, PT, MPT 05/17/20, 6:39 PM  [facility] [address]  [place], [place], [address] Phone: [phone]   Fax:  [phone]

## 2020-05-20 ENCOUNTER — Encounter: Payer: Self-pay | Admitting: Physical Therapy

## 2020-05-20 ENCOUNTER — Other Ambulatory Visit: Payer: Self-pay

## 2020-05-20 ENCOUNTER — Ambulatory Visit: Payer: Medicare Other | Admitting: Physical Therapy

## 2020-05-20 DIAGNOSIS — M6281 Muscle weakness (generalized): Secondary | ICD-10-CM | POA: Diagnosis not present

## 2020-05-20 DIAGNOSIS — R2681 Unsteadiness on feet: Secondary | ICD-10-CM

## 2020-05-20 NOTE — Therapy (Signed)
[facility] [address]  [place], [place], [address] Phone: [phone]   Fax:  [phone]  Physical Therapy Treatment  Patient Details  Name: Wanda [name] MRN: [id number] Date of Birth: 01-22-1940 Referring Provider (PT): Lamar Blinks, MD   Encounter Date: 05/20/2020   PT End of Session - 05/20/20 1311    Visit Number 11    Number of Visits 16    Date for PT Re-Evaluation 05/24/20    Authorization Type Medicare    PT Start Time 1311    PT Stop Time 1351    PT Time Calculation (min) 40 min    Activity Tolerance Patient tolerated treatment well    Behavior During Therapy [facility] for tasks assessed/performed           Past Medical History:  Diagnosis Date  . Arthritis   . Carotid stenosis 11/2018  . Cataract    surgery to remove  . Diabetes mellitus    type 2  . Hyperlipidemia   . Hypertension   . Skin cancer    Removed from face  . Stroke ([place]) 09/19/2018  . Urgency of urination   . Urinary leakage     Past Surgical History:  Procedure Laterality Date  . ABDOMINAL HYSTERECTOMY  early 80's   total  . CAROTID ENDARTERECTOMY Left 11/13/2018  . CARPAL TUNNEL RELEASE Bilateral 1980 and 1981   both hands  . ENDARTERECTOMY Left 11/13/2018   Procedure: Left Carotid Artery Endarterectomy with Patch Angioplasty;  Surgeon: Rosetta Posner, MD;  Location: [place];  Service: Vascular;  Laterality: Left;  . EYE SURGERY     bilateral cataracts  . Fatty Tumor Excision    . JOINT REPLACEMENT    . TONSILLECTOMY    . TONSILLECTOMY AND ADENOIDECTOMY  age 18  . TOTAL KNEE ARTHROPLASTY Right 10/04/2015  . TOTAL KNEE ARTHROPLASTY Right 10/04/2015   Procedure: TOTAL KNEE ARTHROPLASTY;  Surgeon: Dorna Leitz, MD;  Location: [place];  Service: Orthopedics;  Laterality: Right;  . TOTAL KNEE ARTHROPLASTY Left 01/28/2016   Procedure: TOTAL KNEE ARTHROPLASTY;  Surgeon: Dorna Leitz, MD;  Location: [place];  Service:  Orthopedics;  Laterality: Left;  . TUBAL LIGATION      There were no vitals filed for this visit.   Subjective Assessment - 05/20/20 1314    Subjective Pt reports she has good days and bad days so sometimes it's hard to recognize her progress. She does note improving balance with activities such as showering.    Pertinent History Stroke 09/19/2018; R TKA 10/04/2015; L TKA 01/28/2016    Patient Stated Goals To walk further without pain and to feel better    Currently in Pain? Yes    Pain Score 6     Pain Location Leg    Pain Orientation Right;Left    Pain Descriptors / Indicators Aching    Pain Type Chronic pain    Pain Frequency Constant                             [facility] Adult PT Treatment/Exercise - 05/20/20 1311      Ambulation/Gait   Ambulation/Gait Assistance 5: Supervision    Ambulation/Gait Assistance Details cues for heel strike to increase hip and knee flexion and longer stride length    Ambulation Distance (Feet) 270 Feet   x 2   Assistive device None   & B trekking/hiking poles  Gait Pattern Step-through pattern;Decreased stride length;Wide base of support;Trunk flexed;Decreased hip/knee flexion - right;Decreased hip/knee flexion - left;Poor foot clearance - left;Poor foot clearance - right    Ambulation Surface Level;Indoor    Gait Comments Pt able to narrow BOS with better foot clearance when focusing on heel strike but had difficulty coordinating reciprocal pattern with trekking poles      Knee/Hip Exercises: Aerobic   Nustep L5 x 6 min (UE/LE)      Knee/Hip Exercises: Standing   Hip Flexion Right;Left;10 reps;Stengthening;Knee bent    Hip Flexion Limitations looped red TB at ankles - UE support on back of chair    Hip Abduction Right;Left;10 reps;Stengthening;Knee straight    Abduction Limitations looped red TB at ankles - UE support on back of chair    Hip Extension Right;Left;10 reps;Stengthening;Knee straight    Extension Limitations looped  red TB at ankles - UE support on back of chair                  PT Education - 05/20/20 1350    Education Details HEP review & update - Access Code: [email or web address]    Person(s) Educated Patient    Methods Explanation;Demonstration;Verbal cues;Handout    Comprehension Verbalized understanding;Verbal cues required;Returned demonstration            PT Short Term Goals - 05/17/20 1356      PT SHORT TERM GOAL #1   Title Pt will score > or = 13 on FGA    Status Achieved   21/30 (5/9)   Target Date 04/26/20      PT SHORT TERM GOAL #2   Title Pt. will be independent with initial HEP    Status Achieved    Target Date 04/26/20   04/29/2020            PT Long Term Goals - 05/17/20 1325      PT LONG TERM GOAL #1   Title Pt will score > 19 on the FGA to demonstrate an increase in dynamic ambulation function to decrease fall risk. .    Status Achieved   21/30 (5/9)     PT LONG TERM GOAL #2   Title Pt. will demonstrate independence with ongoing HEP for continuing improvements after DC    Status Partially Met      PT LONG TERM GOAL #3   Title Pt. will increase B Hip strength to > or = 4+/5 for improved gait mechanics    Status Partially Met   L hip flexion and ext 4/5     PT LONG TERM GOAL #4   Title Pt will score > or = 51 on the Berg Balance Test to demonstrate a decrease risk for falls    Status On-going   44/56 (5/9)                Plan - 05/20/20 1320    Clinical Impression Statement Wanda [name] notes some improvements in her balance but states she still has good days and bad days which she bases on her energy level. She continues to demonstrate tendency for shuffling gait with wide BOS and decreased foot clearance - cues provided for increased hip and knee flexion aiming for heel strike on weight acceptance to promote more normalized gait pattern. When pt focusing on heel strike, she is able to ambulate with improved foot clearance and narrower BOS approximating a much more  normal gait pattern. HEP updated to focus on step pattern as described as well  as further hip strengthening addressing ongoing weakness from MMT last visit. Wanda [name] in trying to transition to her HEP as of her last visit next week but will likely remain on 30-day hold.    Personal Factors and Comorbidities Age;Fitness;Past/Current Experience;Comorbidity 3+    Comorbidities Stroke, Arthritis, DM Type 2, HTN, B TKA    PT Frequency 2x / week    PT Duration 8 weeks    PT Treatment/Interventions ADLs/Self Care Home Management;Cryotherapy;Electrical Stimulation;Iontophoresis 77m/ml Dexamethasone;Moist Heat;Ultrasound;Gait training;Stair training;Functional mobility training;Therapeutic activities;Therapeutic exercise;Balance training;Neuromuscular re-education;Manual techniques;Wheelchair mobility training;Orthotic Fit/Training;Patient/family education;Passive range of motion;Dry needling;Energy conservation;Joint Manipulations;Vestibular;Vasopneumatic Device;Taping    PT Next Visit Plan Final HEP review addressing any pt questions/concerns; goal assessment with anticipated transition to HEP + 30-day hold    PT [place]vention Program; [id number] (4/14); [email or web address] (5/12)    Consulted and Agree with Plan of Care Patient           Patient will benefit from skilled therapeutic intervention in order to improve the following deficits and impairments:  Abnormal gait,Decreased coordination,Decreased range of motion,Difficulty walking,Impaired tone,Decreased endurance,Decreased activity tolerance,Decreased knowledge of precautions,Impaired perceived functional ability,Pain,Decreased balance,Improper body mechanics,Decreased mobility,Decreased strength,Postural dysfunction,Impaired sensation  Visit Diagnosis: Unsteadiness on feet  Muscle weakness (generalized)     Problem List Patient Active Problem List   Diagnosis Date Noted  . Carotid stenosis 11/13/2018  . CVA  (cerebral vascular accident) ([place] 09/19/2018  . Brainstem infarct, acute ([place]   . Primary osteoarthritis of left knee 10/04/2015  . Osteoporosis 08/23/2015  . Multinodular goiter (nontoxic) 04/07/2014  . Controlled type 2 diabetes with retinopathy ([place] 10/09/2013  . Insulin adverse reaction 09/27/2013  . Senile nuclear sclerosis 08/14/2012  . Essential hypertension 03/10/2011  . Hyperlipidemia with target LDL less than 70 03/10/2011  . Obesity (BMI 30.0-34.9) 03/10/2011    JPercival Spanish PT, MPT 05/20/2020, 4:59 PM  [facility] [place] [address]Phone: [phone]  Fax:  [phone] Name: RTacori KvammeMRN: [id number] of Birth: 106-06-1940

## 2020-05-20 NOTE — Patient Instructions (Signed)
    Access Code: [email or web address] URL: [email or web address] Date: 05/20/2020 Prepared by: Annie Paras  Exercises Walking March - 1 x daily - 7 x weekly - 2 sets - 10 reps - 3 hold Hip Extension with Resistance Loop - 1 x daily - 7 x weekly - 2 sets - 10 reps - 3 sec hold Hip and Knee Flexion with Anchored Resistance - 1 x daily - 7 x weekly - 2 sets - 10 reps - 3 sec hold Hip Abduction with Resistance Loop - 1 x daily - 7 x weekly - 2 sets - 10 reps - 3 sec hold

## 2020-05-24 ENCOUNTER — Other Ambulatory Visit: Payer: Self-pay

## 2020-05-24 ENCOUNTER — Ambulatory Visit: Payer: Medicare Other | Admitting: Physical Therapy

## 2020-05-24 ENCOUNTER — Encounter: Payer: Self-pay | Admitting: Physical Therapy

## 2020-05-24 DIAGNOSIS — R2681 Unsteadiness on feet: Secondary | ICD-10-CM | POA: Diagnosis not present

## 2020-05-24 DIAGNOSIS — M6281 Muscle weakness (generalized): Secondary | ICD-10-CM | POA: Diagnosis not present

## 2020-05-24 NOTE — Therapy (Addendum)
[facility] [address]  [place], [place], [address] Phone: [phone]   Fax:  [phone]  Physical Therapy Treatment / Progress Note / Discharge Summary  Patient Details  Name: Wanda [name] MRN: [id number] Date of Birth: 03-04-1940 Referring Provider (PT): Lamar Blinks, MD  Progress Note  Reporting Period 05/17/2020 to 05/24/2020  See note below for Objective Data and Assessment of Progress/Goals.      Encounter Date: 05/24/2020   PT End of Session - 05/24/20 1315     Visit Number 12    Number of Visits 16    Date for PT Re-Evaluation 05/24/20    Authorization Type Medicare    PT Start Time 1315    PT Stop Time 1404    PT Time Calculation (min) 49 min    Activity Tolerance Patient tolerated treatment well    Behavior During Therapy [facility] for tasks assessed/performed             Past Medical History:  Diagnosis Date   Arthritis    Carotid stenosis 11/2018   Cataract    surgery to remove   Diabetes mellitus    type 2   Hyperlipidemia    Hypertension    Skin cancer    Removed from face   Stroke ([place]) 09/19/2018   Urgency of urination    Urinary leakage     Past Surgical History:  Procedure Laterality Date   ABDOMINAL HYSTERECTOMY  early 80's   total   CAROTID ENDARTERECTOMY Left 11/13/2018   CARPAL TUNNEL RELEASE Bilateral 1980 and 1981   both hands   ENDARTERECTOMY Left 11/13/2018   Procedure: Left Carotid Artery Endarterectomy with Patch Angioplasty;  Surgeon: Rosetta Posner, MD;  [facility];  Service: Vascular;  Laterality: Left;   EYE SURGERY     bilateral cataracts   Fatty Tumor Excision     JOINT REPLACEMENT     TONSILLECTOMY     TONSILLECTOMY AND ADENOIDECTOMY  age 80   TOTAL KNEE ARTHROPLASTY Right 10/04/2015   TOTAL KNEE ARTHROPLASTY Right 10/04/2015   Procedure: TOTAL KNEE ARTHROPLASTY;  Surgeon: Dorna Leitz, MD;  Location: [place];  Service: Orthopedics;   Laterality: Right;   TOTAL KNEE ARTHROPLASTY Left 01/28/2016   Procedure: TOTAL KNEE ARTHROPLASTY;  Surgeon: Dorna Leitz, MD;  Location: [place];  Service: Orthopedics;  Laterality: Left;   TUBAL LIGATION      There were no vitals filed for this visit.   Subjective Assessment - 05/24/20 1318     Subjective Pt reports increased pain in her legs when the front came through over the weekend.    Pertinent History Stroke 09/19/2018; R TKA 10/04/2015; L TKA 01/28/2016    Patient Stated Goals To walk further without pain and to feel better    Currently in Pain? Yes    Pain Score 6     Pain Location Leg    Pain Orientation Right;Left    Pain Descriptors / Indicators Aching    Pain Type Chronic pain    Pain Frequency Constant                [facility] PT Assessment - 05/24/20 1315       Assessment   Medical Diagnosis Gait Instability    Referring Provider (PT) Lamar Blinks, MD    Next MD Visit 09/15/2020      Strength   Right Hip Flexion 4+/5    Right Hip Extension 4+/5  Right Hip ABduction 4+/5    Right Hip ADduction 4+/5    Left Hip Flexion 4+/5    Left Hip Extension 4+/5    Left Hip ABduction 4+/5    Left Hip ADduction 4+/5    Right Knee Flexion 5/5    Right Knee Extension 5/5    Left Knee Flexion 5/5    Left Knee Extension 5/5    Right Ankle Dorsiflexion 4+/5    Right Ankle Plantar Flexion 4+/5   15 SLS heel raises   Left Ankle Dorsiflexion 4+/5    Left Ankle Plantar Flexion 4+/5   14 SLS heel raises     Berg Balance Test   Sit to Stand Able to stand without using hands and stabilize independently    Standing Unsupported Able to stand safely 2 minutes    Sitting with Back Unsupported but Feet Supported on Floor or Stool Able to sit safely and securely 2 minutes    Stand to Sit Sits safely with minimal use of hands    Transfers Able to transfer safely, minor use of hands    Standing Unsupported with Eyes Closed Able to stand 10 seconds with supervision    Standing  Unsupported with Feet Together Able to place feet together independently and stand 1 minute safely    From Standing, Reach Forward with Outstretched Arm Can reach confidently >25 cm (10")    From Standing Position, Pick up Object from Floor Able to pick up shoe safely and easily    From Standing Position, Turn to Look Behind Over each Shoulder Looks behind from both sides and weight shifts well    Turn 360 Degrees Able to turn 360 degrees safely in 4 seconds or less    Standing Unsupported, Alternately Place Feet on Step/Stool Able to complete 4 steps without aid or supervision   only 4 steps to 9" step; able to complete 8 steps in <20 to 6" & 8" steps   Standing Unsupported, One Foot in Front Able to take small step independently and hold 30 seconds    Standing on One Leg Tries to lift leg/unable to hold 3 seconds but remains standing independently    Total Score 48      Functional Gait  Assessment   Gait Level Surface Walks 20 ft in less than 5.5 sec, no assistive devices, good speed, no evidence for imbalance, normal gait pattern, deviates no more than 6 in outside of the 12 in walkway width.    Change in Gait Speed Able to smoothly change walking speed without loss of balance or gait deviation. Deviate no more than 6 in outside of the 12 in walkway width.    Gait with Horizontal Head Turns Performs head turns smoothly with slight change in gait velocity (eg, minor disruption to smooth gait path), deviates 6-10 in outside 12 in walkway width, or uses an assistive device.    Gait with Vertical Head Turns Performs task with slight change in gait velocity (eg, minor disruption to smooth gait path), deviates 6 - 10 in outside 12 in walkway width or uses assistive device    Gait and Pivot Turn Pivot turns safely in greater than 3 sec and stops with no loss of balance, or pivot turns safely within 3 sec and stops with mild imbalance, requires small steps to catch balance.    Step Over Obstacle Is able  to step over one shoe box (4.5 in total height) without changing gait speed. No evidence of imbalance.  Gait with Narrow Base of Support Is able to ambulate for 10 steps heel to toe with no staggering.    Gait with Eyes Closed Walks 20 ft, uses assistive device, slower speed, mild gait deviations, deviates 6-10 in outside 12 in walkway width. Ambulates 20 ft in less than 9 sec but greater than 7 sec.    Ambulating Backwards Walks 20 ft, slow speed, abnormal gait pattern, evidence for imbalance, deviates 10-15 in outside 12 in walkway width.    Steps Two feet to a stair, must use rail.    Total Score 21                           [facility] Adult PT Treatment/Exercise - 05/24/20 1315       Knee/Hip Exercises: Aerobic   Nustep L4 x 6 min (UE/LE)      Knee/Hip Exercises: Standing   Forward Step Up Right;Left;2 sets;10 reps;Step Height: 4";Step Height: 6";Hand Hold: 2;Hand Hold: 1    Forward Step Up Limitations step-up and over aerobic step    Step Down Right;Left;2 sets;5 reps;Step Height: 6";Step Height: 4";Hand Hold: 2    Step Down Limitations lateral (6") & fwd (4") eccentric step-tap    Other Standing Knee Exercises Alt toe clears to x 10 each to 4", 6" & 8" steps, x 4 to 9" step                      PT Short Term Goals - 05/17/20 1356       PT SHORT TERM GOAL #1   Title Pt will score > or = 13 on FGA    Status Achieved   21/30 (5/9)   Target Date 04/26/20      PT SHORT TERM GOAL #2   Title Pt. will be independent with initial HEP    Status Achieved    Target Date 04/26/20   04/29/2020              PT Long Term Goals - 05/24/20 1323       PT LONG TERM GOAL #1   Title Pt will score > 19 on the FGA to demonstrate an increase in dynamic ambulation function to decrease fall risk.    Status Achieved   05/24/20: FGA = 22/30     PT LONG TERM GOAL #2   Title Pt. will demonstrate independence with ongoing HEP for continuing improvements after DC     Status Achieved   05/24/20     PT LONG TERM GOAL #3   Title Pt. will increase B Hip strength to > or = 4+/5 for improved gait mechanics    Status Achieved   05/24/20     PT LONG TERM GOAL #4   Title Pt will score > or = 51 on the Berg Balance Test to demonstrate a decrease risk for falls    Status Not Met   05/24/20: Berg = 48/56 (4 pt improvement)                  Plan - 05/24/20 1404     Clinical Impression Statement Wanda [name] has demonstrated good progress with Pt. Overall B LE 4+/5 to 5/5. Balance improved as evidenced by increase in Berg score from 44/56 to 48/56 and FGA increased from 8/30 to 21/30. She is able to demonstrate good gait pattern with improved hip/knee flexion, increased stride length, improved heel-strike and better foot clearance with normal  BOS but reverts to wide base shuffle when not concentrating on step pattern. All goals met except Berg. She is independent with the [place] as well as targeted proximal strengthening to improve gait pattern and feels comfortable transitioning to the HEP at this time but would like to remain on hold for 30-days in the event that she feels the need to return to PT secondary to issues with her HEP.    Personal Factors and Comorbidities Age;Fitness;Past/Current Experience;Comorbidity 3+    Comorbidities Stroke, Arthritis, DM Type 2, HTN, B TKA    Rehab Potential Good    PT Frequency --    PT Duration --    PT Treatment/Interventions ADLs/Self Care Home Management;Cryotherapy;Electrical Stimulation;Iontophoresis 52m/ml Dexamethasone;Moist Heat;Ultrasound;Gait training;Stair training;Functional mobility training;Therapeutic activities;Therapeutic exercise;Balance training;Neuromuscular re-education;Manual techniques;Wheelchair mobility training;Orthotic Fit/Training;Patient/family education;Passive range of motion;Dry needling;Energy conservation;Joint Manipulations;Vestibular;Vasopneumatic Device;Taping    PT Next Visit  Plan transition to HEP + 30-day hold    PT HNor[place]Prevention Program; [id number] (4/14); [email or web address] (5/12)    Consulted and Agree with Plan of Care Patient             Patient will benefit from skilled therapeutic intervention in order to improve the following deficits and impairments:  Abnormal gait,Decreased coordination,Decreased range of motion,Difficulty walking,Impaired tone,Decreased endurance,Decreased activity tolerance,Decreased knowledge of precautions,Impaired perceived functional ability,Pain,Decreased balance,Improper body mechanics,Decreased mobility,Decreased strength,Postural dysfunction,Impaired sensation  Visit Diagnosis: Unsteadiness on feet  Muscle weakness (generalized)     Problem List Patient Active Problem List   Diagnosis Date Noted   Carotid stenosis 11/13/2018   CVA (cerebral vascular accident) ([place] 09/19[place]2018   Brainstem infarct, acute ([place]    Primary osteoarthritis of left knee 10/04/2015   Osteoporosis 08/23/2015   Multinodular goiter (nontoxic) 04/07/2014   Controlled type 2 diabetes with retinopathy ([place] 10/09/2013   Insulin adverse reaction 09/27/2013   Senile nuclear sclerosis 08/14/2012   Essential hypertension 03/10/2011   Hyperlipidemia with target LDL less than 70 03/10/2011   Obesity (BMI 30.0-34.9) 03/10/2011    JPercival Spanish PT, MPT 05/24/2020, 2:22 PM  [facility] [place] [address]Phone: [phone]  Fax:  [phone] Name: RLorriann HansmannMRN: [id number] of Birth: 1Nov 12, 1942   PHYSICAL THERAPY DISCHARGE SUMMARY  Visits from Start of Care: 12  Current functional level related to goals / functional outcomes:   Refer to above clinical impression for status as of last visit on 05/24/2020. Patient was placed on hold for 30 days and has not needed to return to PT, therefore will proceed with discharge  from PT for this episode.   Remaining deficits:   As above.   Education / Equipment:   HAlbiaProgram   Patient agrees to discharge. Patient goals were partially met. Patient is being discharged due to being pleased with the current functional level.   JPercival Spanish PT, MPT 07/16/20, 10:39 AM  [facility]villeHC[address] [place] [address]Phone: [phone]  Fax:  [phone]

## 2020-06-09 ENCOUNTER — Encounter: Payer: Self-pay | Admitting: Family Medicine

## 2020-06-09 MED ORDER — LANTUS SOLOSTAR 100 UNIT/ML ~~LOC~~ SOPN: 15.0000 [IU] | PEN_INJECTOR | Freq: Every day | SUBCUTANEOUS | 3 refills | Status: DC

## 2020-06-10 ENCOUNTER — Other Ambulatory Visit: Payer: Self-pay | Admitting: Family Medicine

## 2020-07-20 ENCOUNTER — Other Ambulatory Visit: Payer: Self-pay | Admitting: Family Medicine

## 2020-07-20 DIAGNOSIS — I10 Essential (primary) hypertension: Secondary | ICD-10-CM

## 2020-09-13 NOTE — Progress Notes (Deleted)
[facility] at [facility] [address] [place], [place] [address] [phone]  Date:  09/15/2020   Name:  Wanda [name]   DOB:  12-Sep-1940   MRN:  [id number]  PCP:  Darreld Mclean, MD    Chief Complaint: No chief complaint on file.   History of Present Illness:  Wanda [name] is a 80 y.o. very pleasant female patient who presents with the following:  Patient seen today for periodic/6 month follow-up History of CVA 09/2018, carotid stenosis, diabetes, hypertension, hyperlipidemia She has been evaluated by vascular surgery, status post left endarterectomy in November of 2020 for carotid stenosis Most recent visit with myself was in March That time she is having some difficulty with general strength and balance, was interested in physical therapy for same  I got in touch with her about her very elevated A1c back in March, but she did not respond to me at that time-need to query patient about MyChart use  Clonidine? Hydrochlorothiazide Amlodipine 5 Plavix Glipizide Insulin glargine  Shingles vaccine Tetanus Foot exam COVID-19 booster  Lab Results  Component Value Date   HGBA1C 11.1 (H) 03/11/2020    Patient Active Problem List   Diagnosis Date Noted   Carotid stenosis 11/13/2018   CVA (cerebral vascular accident) ([place]) 09/19/2018   Brainstem infarct, acute ([place])    Primary osteoarthritis of left knee 10/04/2015   Osteoporosis 08/23/2015   Multinodular goiter (nontoxic) 04/07/2014   Controlled type 2 diabetes with retinopathy ([place]) 10/09/2013   Insulin adverse reaction 09/27/2013   Senile nuclear sclerosis 08/14/2012   Essential hypertension 03/10/2011   Hyperlipidemia with target LDL less than 70 03/10/2011   Obesity (BMI 30.0-34.9) 03/10/2011    Past Medical History:  Diagnosis Date   Arthritis    Carotid stenosis 11/2018   Cataract    surgery to remove   Diabetes mellitus    type 2    Hyperlipidemia    Hypertension    Skin cancer    Removed from face   Stroke ([place]) 09/19/2018   Urgency of urination    Urinary leakage     Past Surgical History:  Procedure Laterality Date   ABDOMINAL HYSTERECTOMY  early 80's   total   CAROTID ENDARTERECTOMY Left 11/13/2018   CARPAL TUNNEL RELEASE Bilateral 1980 and 1981   both hands   ENDARTERECTOMY Left 11/13/2018   Procedure: Left Carotid Artery Endarterectomy with Patch Angioplasty;  Surgeon: Rosetta Posner, MD;  Location: [facility] OR;  Service: Vascular;  Laterality: Left;   EYE SURGERY     bilateral cataracts   Fatty Tumor Excision     JOINT REPLACEMENT     TONSILLECTOMY     TONSILLECTOMY AND ADENOIDECTOMY  age 56   TOTAL KNEE ARTHROPLASTY Right 10/04/2015   TOTAL KNEE ARTHROPLASTY Right 10/04/2015   Procedure: TOTAL KNEE ARTHROPLASTY;  Surgeon: Dorna Leitz, MD;  Location: [place];  Service: Orthopedics;  Laterality: Right;   TOTAL KNEE ARTHROPLASTY Left 01/28/2016   Procedure: TOTAL KNEE ARTHROPLASTY;  Surgeon: Dorna Leitz, MD;  Location: [place];  Service: Orthopedics;  Laterality: Left;   TUBAL LIGATION      Social History   Tobacco Use   Smoking status: Never   Smokeless tobacco: Never  Vaping Use   Vaping Use: Never used  Substance Use Topics   Alcohol use: Yes    Alcohol/week: 1.0 standard drink    Types: 1 Standard drinks or equivalent per week  Comment: maybe once per month - 3 drinks   Drug use: No    Family History  Problem Relation Age of Onset   Pancreatitis Father        deceased 84   Cancer Other    Cancer Brother        GI   Cancer Mother        liver   Breast cancer Mother 23   Cancer Son        terminal kidney   Breast cancer Maternal Aunt     Allergies  Allergen Reactions   Ace Inhibitors Diarrhea, Swelling, Other (See Comments) and Cough    Pt had cough and diarrhea with first few doses of medication; also had swelling of right eyelid. Stopped medication on 03/17/11.   Alendronate Sodium  Other (See Comments)    "Made my whole body hurt"   Aspirin Hives   Crestor [Rosuvastatin] Other (See Comments)    "Made my whole body hurt"   Lipitor [Atorvastatin Calcium] Other (See Comments)    "Made my whole body hurt"   Metformin And Related     Severe abdominal pain   Shellfish Allergy Nausea Only    Intolerant of fresh shellfish, reports the reaction is GI upset, denies hives, denies any swelling  Reports that she can tolerate canned seafood.     Medication list has been reviewed and updated.  Current Outpatient Medications on File Prior to Visit  Medication Sig Dispense Refill   amLODipine (NORVASC) 5 MG tablet TAKE 1 TABLET(5 MG) BY MOUTH DAILY 90 tablet 1   blood glucose meter kit and supplies Dispense based on patient and insurance preference. Pt just needs meter 1 each 0   Blood Glucose Monitoring Suppl (ONE TOUCH ULTRA MINI) w/Device KIT Use to test blood sugar daily as instructed. Dx: E11.65 1 each 0   cloNIDine (CATAPRES) 0.1 MG tablet Take 1 tablet (0.1 mg total) by mouth 2 (two) times daily. (Patient not taking: Reported on 03/29/2020) 60 tablet 0   clopidogrel (PLAVIX) 75 MG tablet Take 1 tablet (75 mg total) by mouth daily. 90 tablet 1   glipiZIDE (GLUCOTROL XL) 5 MG 24 hr tablet TAKE 2 TABLETS BY MOUTH EVERY MORNING AND 1 TABLET EVERY EVENING 270 tablet 1   glucose blood (ONE TOUCH ULTRA TEST) test strip Test blood sugar 3 times a day. Dx code: 250.00 100 each 12   hydrochlorothiazide (HYDRODIURIL) 25 MG tablet Take 1 tablet (25 mg total) by mouth daily. 90 tablet 3   insulin glargine (LANTUS SOLOSTAR) 100 UNIT/ML Solostar Pen Inject 15-20 Units into the skin daily. 15 mL 3   Insulin Pen Needle (BD PEN NEEDLE NANO U/F) 32G X 4 MM MISC USE TO INJECT INSULIN ONCE DAILY 100 each 6   Insulin Syringe-Needle U-100 (INSULIN SYRINGE .5CC/31GX5/16") 31G X 5/16" 0.5 ML MISC Use to inject insulin 1 time daily. 90 each 3   [DISCONTINUED] lisinopril (PRINIVIL,ZESTRIL) 20 MG tablet  Take 20 mg by mouth daily.      No current facility-administered medications on file prior to visit.    Review of Systems:  As per HPI- otherwise negative.   Physical Examination: There were no vitals filed for this visit. There were no vitals filed for this visit. There is no height or weight on file to calculate BMI. Ideal Body Weight:    GEN: no acute distress. HEENT: Atraumatic, Normocephalic.  Ears and Nose: No external deformity. CV: RRR, No M/G/R. No JVD. No thrill.  No extra heart sounds. PULM: CTA B, no wheezes, crackles, rhonchi. No retractions. No resp. distress. No accessory muscle use. ABD: S, NT, ND, +BS. No rebound. No HSM. EXTR: No c/c/e PSYCH: Normally interactive. Conversant.  Foot exam  Assessment and Plan: ***  This visit occurred during the SARS-CoV-2 public health emergency.  Safety protocols were in place, including screening questions prior to the visit, additional usage of staff PPE, and extensive cleaning of exam room while observing appropriate contact time as indicated for disinfecting solutions.   Signed Lamar Blinks, MD

## 2020-09-15 ENCOUNTER — Ambulatory Visit: Payer: Medicare Other | Admitting: Family Medicine

## 2020-09-15 DIAGNOSIS — E785 Hyperlipidemia, unspecified: Secondary | ICD-10-CM

## 2020-09-15 DIAGNOSIS — I10 Essential (primary) hypertension: Secondary | ICD-10-CM

## 2020-09-15 DIAGNOSIS — E11319 Type 2 diabetes mellitus with unspecified diabetic retinopathy without macular edema: Secondary | ICD-10-CM

## 2020-09-15 DIAGNOSIS — I6302 Cerebral infarction due to thrombosis of basilar artery: Secondary | ICD-10-CM

## 2020-11-04 IMAGING — CT CT HEAD W/O CM
4 series · 16 of 47 positions shown, 18 images · non-contrast
Comparison: None.

CLINICAL DATA: 78-year-old female with difficulty swallowing since
last night.

EXAM:
CT HEAD WITHOUT CONTRAST
TECHNIQUE: Contiguous axial images were obtained from the base of the skull
through the vertex without intravenous contrast.

[Series 3: head without · axial · non-contrast · 0.42mm/px · z∈[-104,+16]mm · 7 of 33 slices shown, 9 images]
[im 5/33  brain]
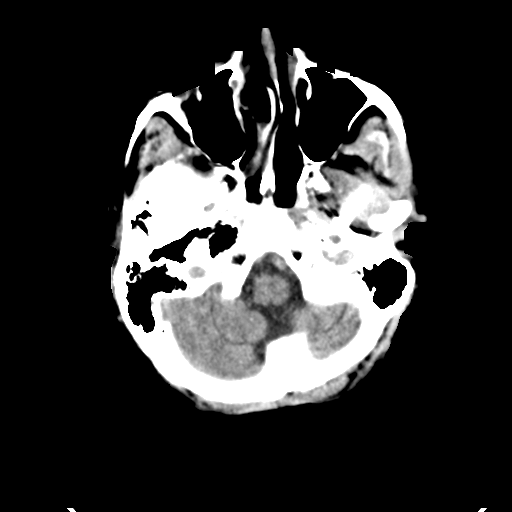
[im 5/33  bone]
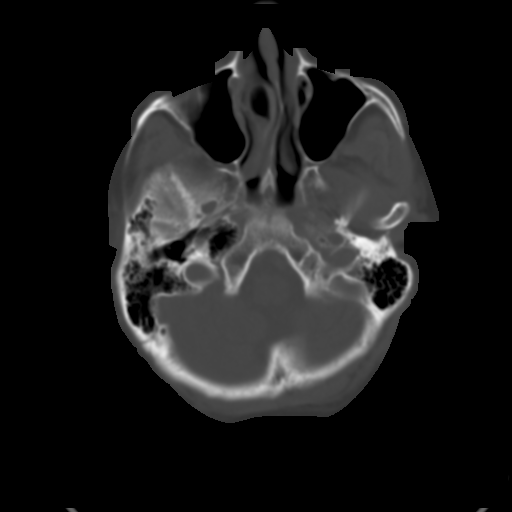
[im 9/33  brain]
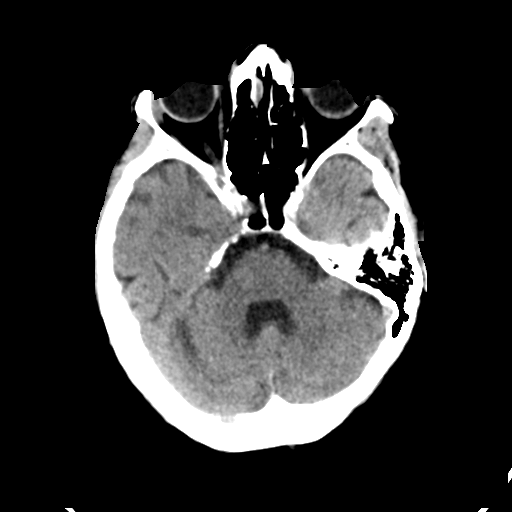
[im 13/33  brain]
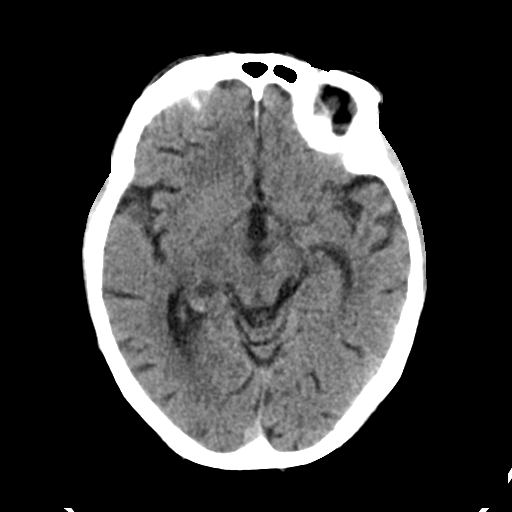
[im 17/33  brain]
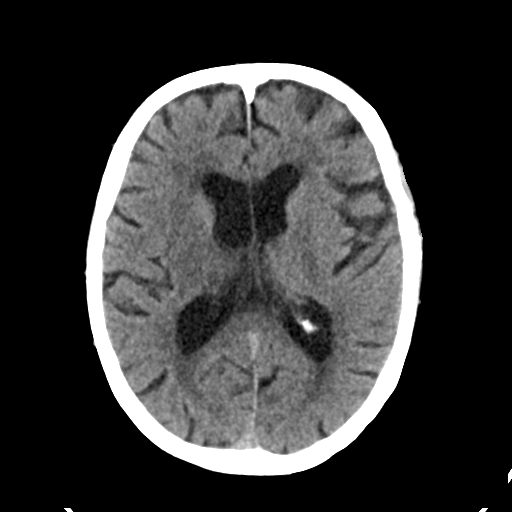
[im 21/33  brain]
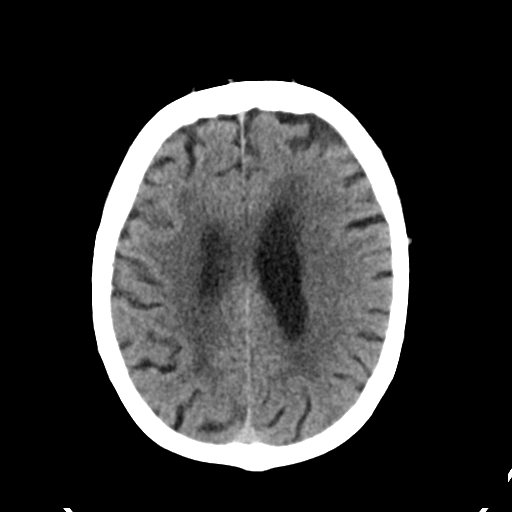
[im 21/33  bone]
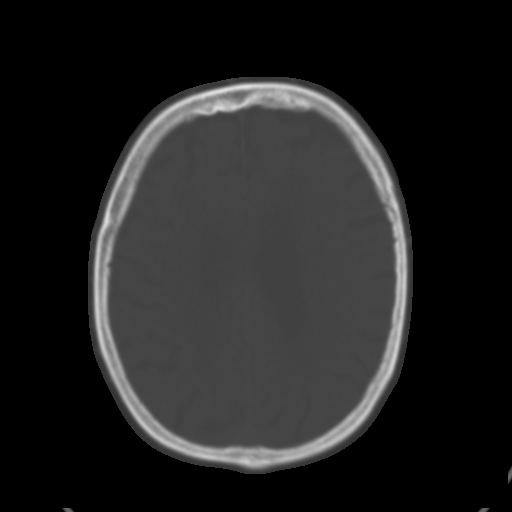
[im 25/33  brain]
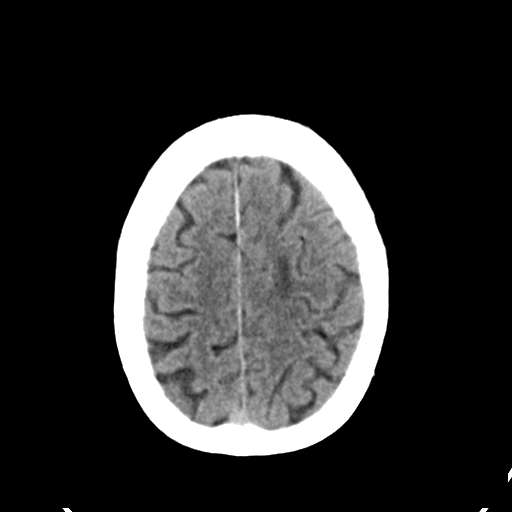
[im 29/33  brain]
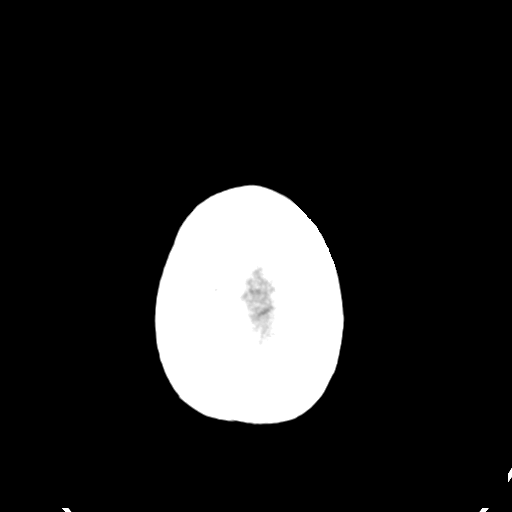

[Series 4: head bone · axial · 0.42mm/px · z∈[-108,-76]mm · 3 of 81 slices shown]
[im 9/81  bone]
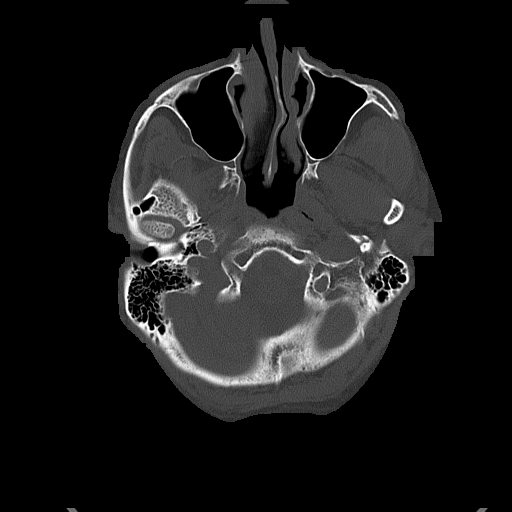
[im 17/81  bone]
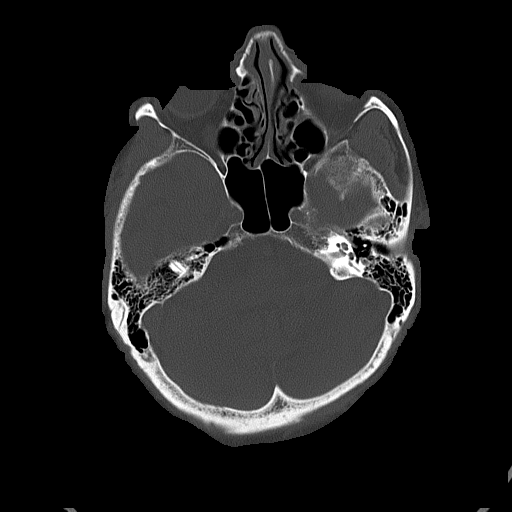
[im 25/81  bone]
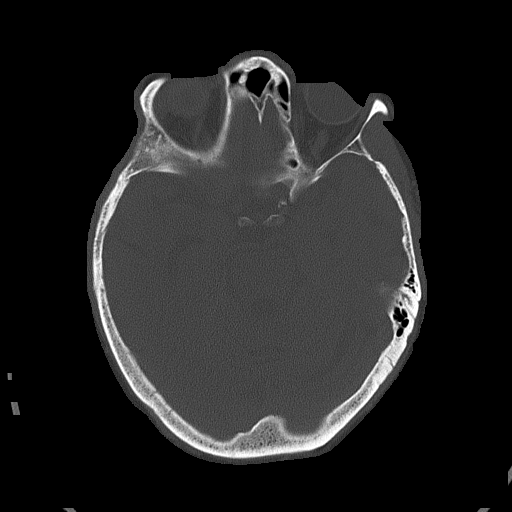

[Series 5: head without cor · coronal · non-contrast · 0.32mm/px · 3 of 67 slices shown]
[im 23/67  brain]
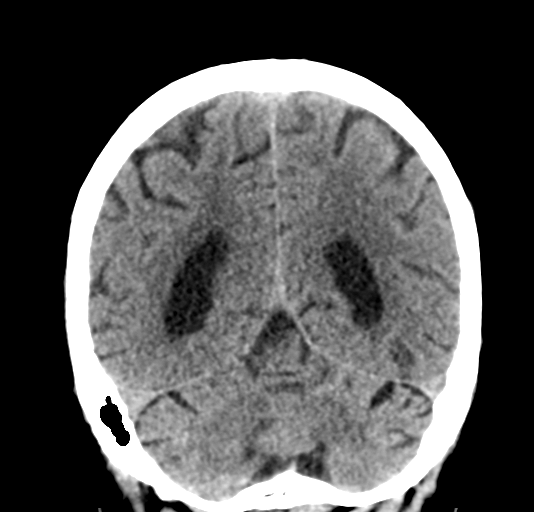
[im 30/67  brain]
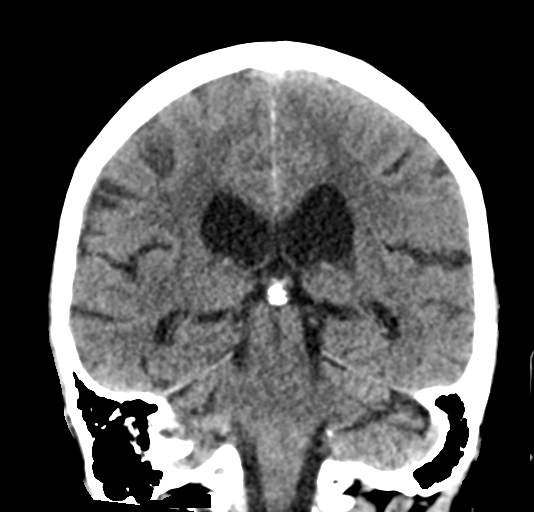
[im 37/67  brain]
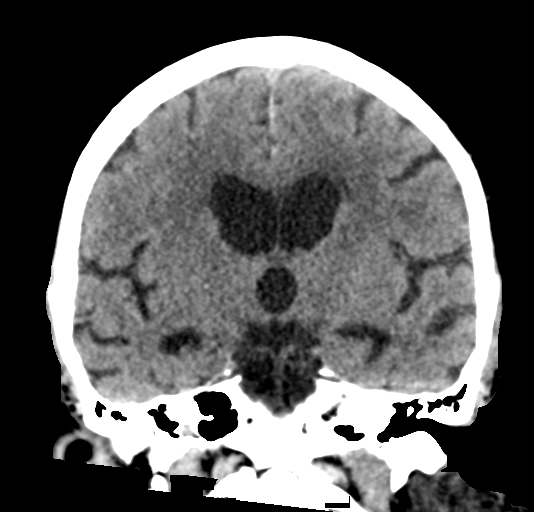

[Series 6: head without sag · sagittal · non-contrast · 0.32mm/px · 3 of 54 slices shown]
[im 18/54  brain]
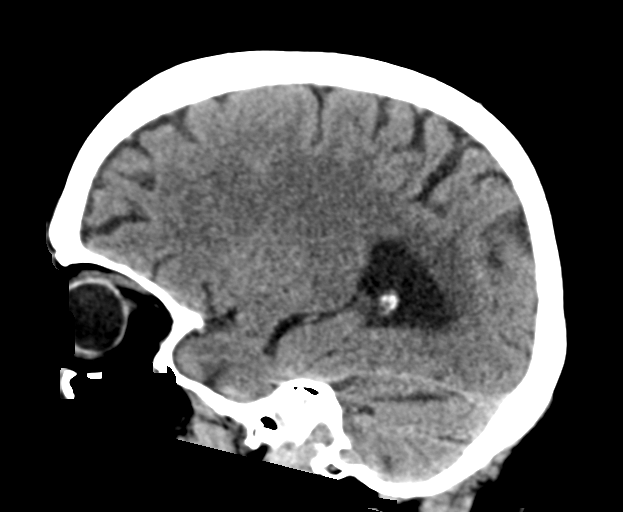
[im 27/54  brain]
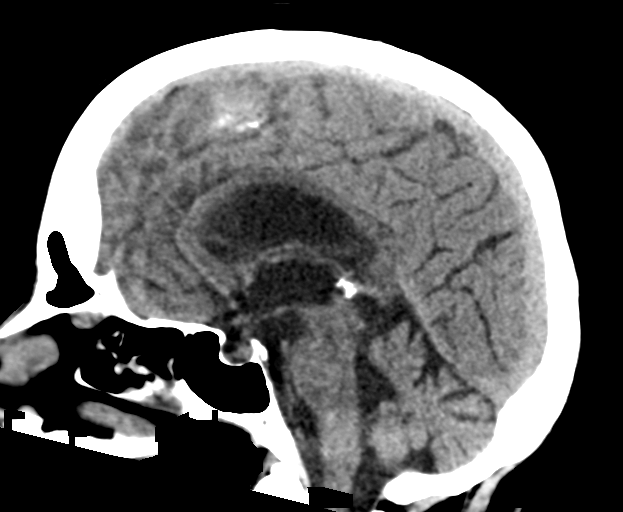
[im 36/54  brain]
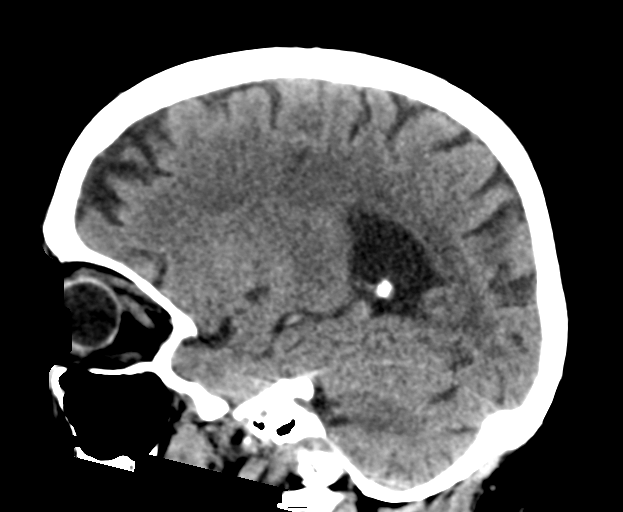

[16 of 47 positions shown; findings below may reference images not displayed]

FINDINGS: Brain: Cerebral volume is within normal limits for age. There is
Patchy and confluent cerebral white matter hypodensity, greater in
the left hemisphere. Hypodensity in the left centrum semiovale on
series 3, image 24 most resembles a chronic white matter lacune.

Gray-white matter differentiation in the deep gray nuclei, brainstem
and cerebellum appears normal.

No midline shift, ventriculomegaly, mass effect, evidence of mass
lesion, intracranial hemorrhage or evidence of cortically based
acute infarction.

Vascular: Calcified atherosclerosis at the skull base. No suspicious
intracranial vascular hyperdensity.

Skull: Negative.

Sinuses/Orbits: Visualized paranasal sinuses and mastoids are clear.

Other: No acute orbit or scalp soft tissue findings.
IMPRESSION: Evidence of cerebral white matter small vessel disease but no acute
cortically based infarct or acute intracranial hemorrhage
identified.

## 2020-11-04 IMAGING — MR MR HEAD W/O CM
6 of 7 series · 31 of 48 positions shown · non-contrast
Comparison: Head CT earlier today.

CLINICAL DATA: 78-year-old female with difficulty swallowing since
last night and left side weakness.

EXAM:
MRI HEAD WITHOUT CONTRAST
TECHNIQUE: Multiplanar, multiecho pulse sequences of the brain and surrounding
structures were obtained without intravenous contrast.

[Series 3: DWI · axial · 3.0mm · 1.09mm/px · z∈[-84,+59]mm · 9 of 100 slices shown (1 of 2)]
[im 1/100]
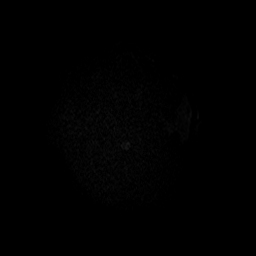
[im 19/100]
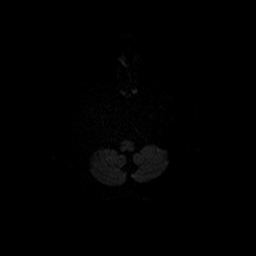
[im 28/100]
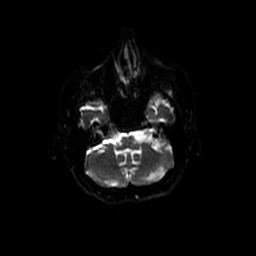
[im 46/100]
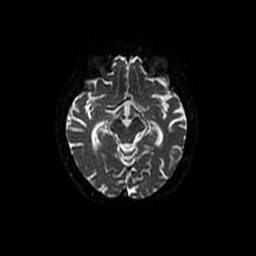
[im 55/100]
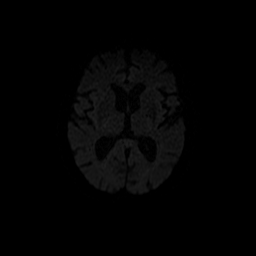
[im 73/100]
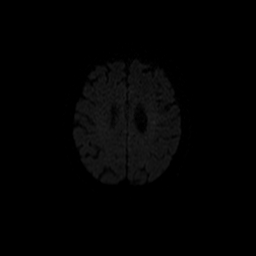
[im 82/100]
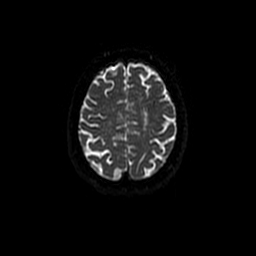
[im 91/100]
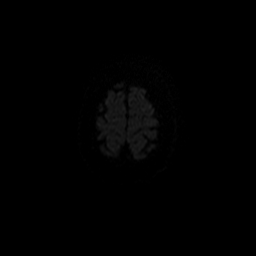
[im 100/100]
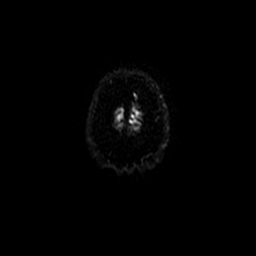

[Series 4: DWI · coronal · 3.0mm · 1.09mm/px · 9 of 102 slices shown (2 of 2)]
[im 1/102]
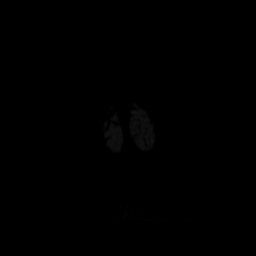
[im 19/102]
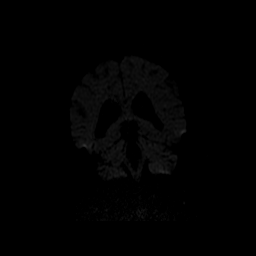
[im 28/102]
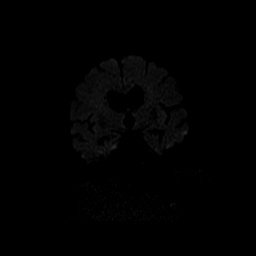
[im 46/102]
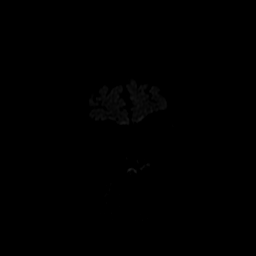
[im 56/102]
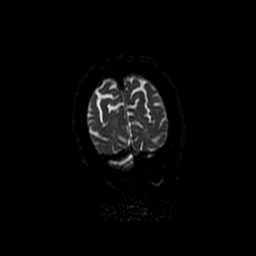
[im 74/102]
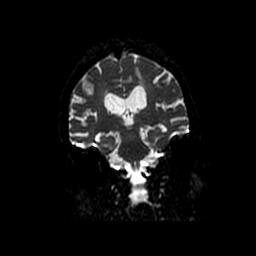
[im 83/102]
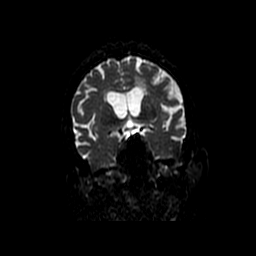
[im 92/102]
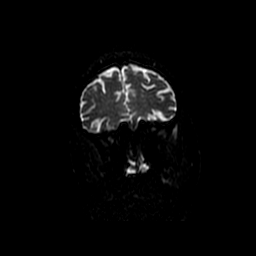
[im 102/102]
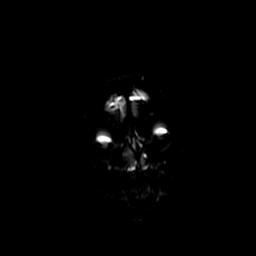

[Series 5: T1 · sagittal · 5.0mm · 0.47mm/px · 3 of 23 slices shown (1 of 2)]
[im 1/23]
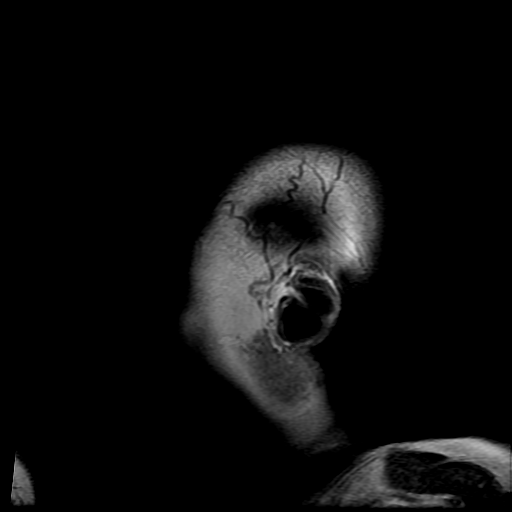
[im 12/23]
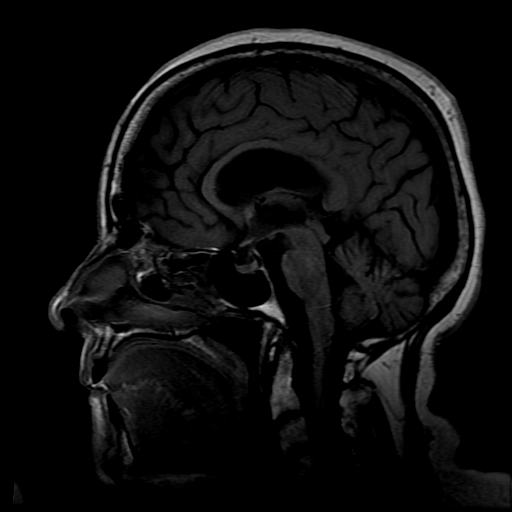
[im 23/23]
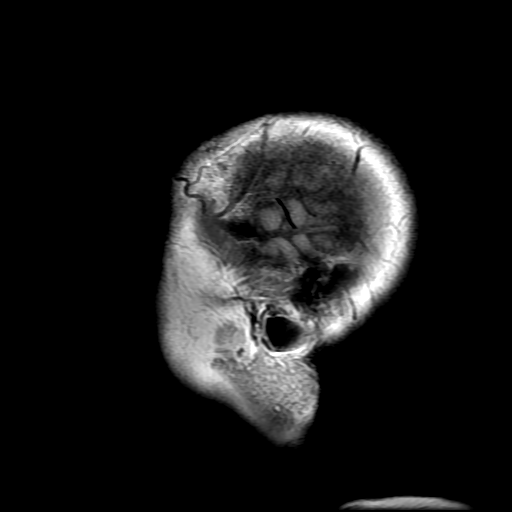

[Series 6: T2 · axial · 5.0mm · 0.43mm/px · z∈[-82,+59]mm · 3 of 25 slices shown]
[im 1/25]
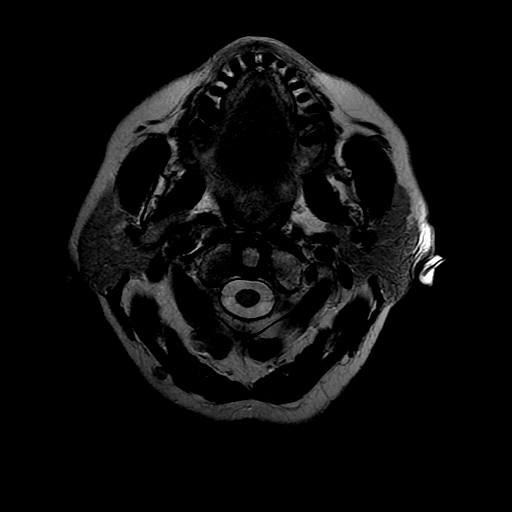
[im 13/25]
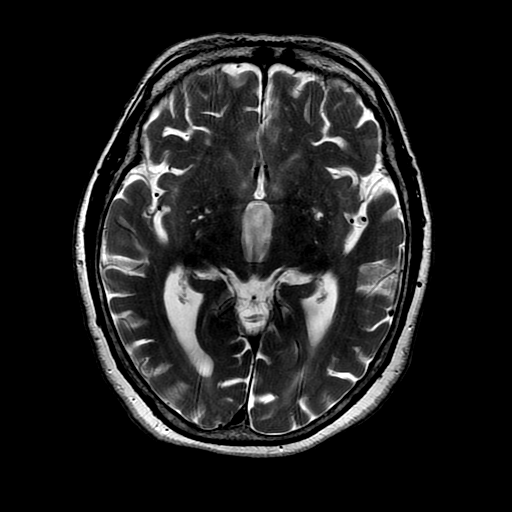
[im 25/25]
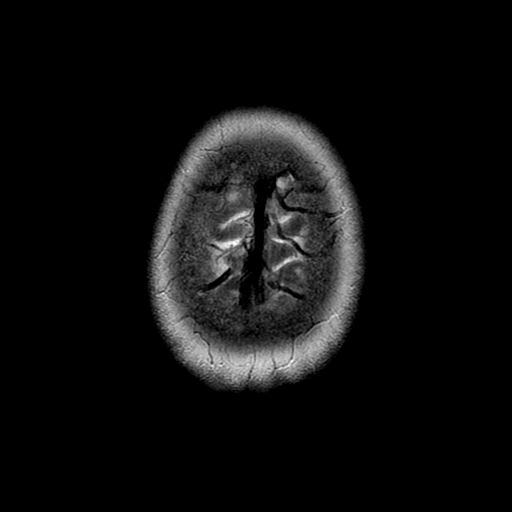

[Series 7: FLAIR · axial · 3.0mm · 0.43mm/px · z∈[-79,+61]mm · 3 of 25 slices shown]
[im 1/25]
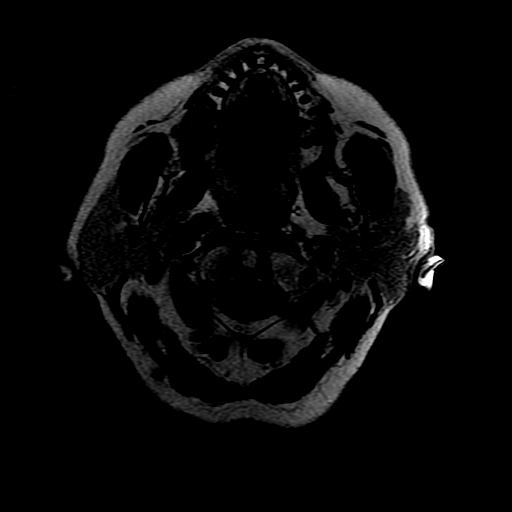
[im 13/25]
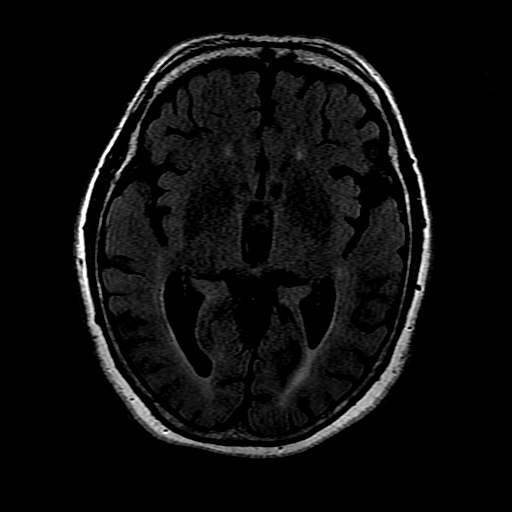
[im 25/25]
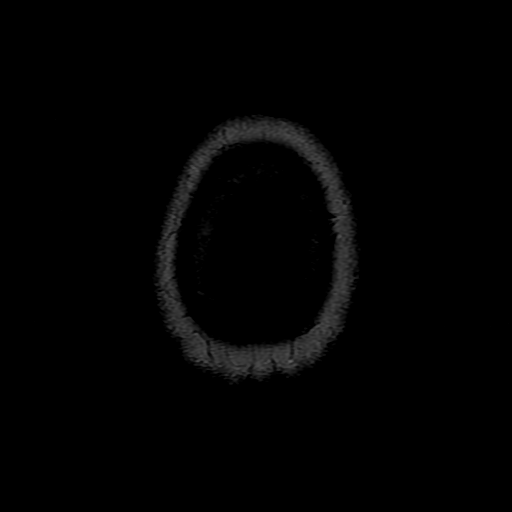

[Series 9: T1 · axial · 3.0mm · 0.47mm/px · z∈[-83,-18]mm · 4 of 100 slices shown (2 of 2)]
[im 1/100]
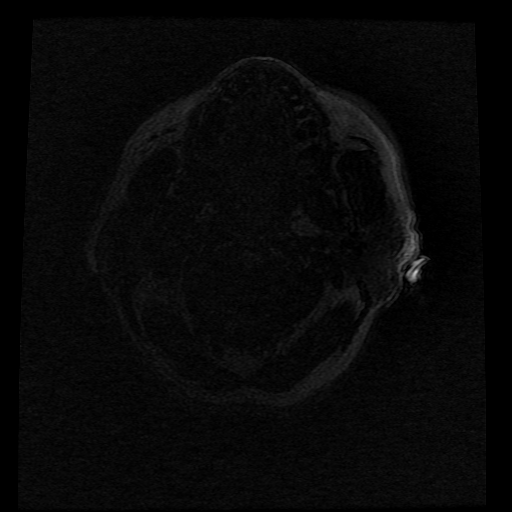
[im 19/100]
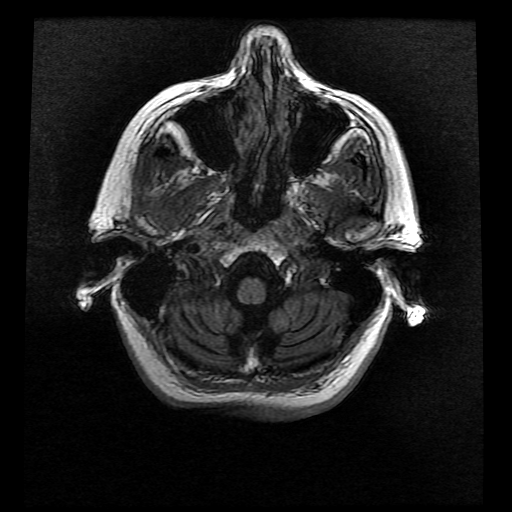
[im 28/100]
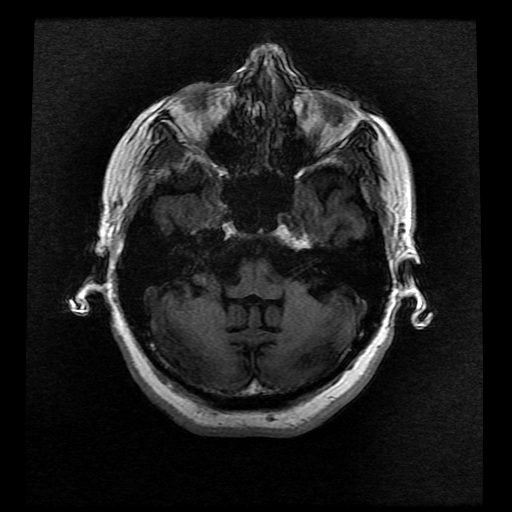
[im 46/100]
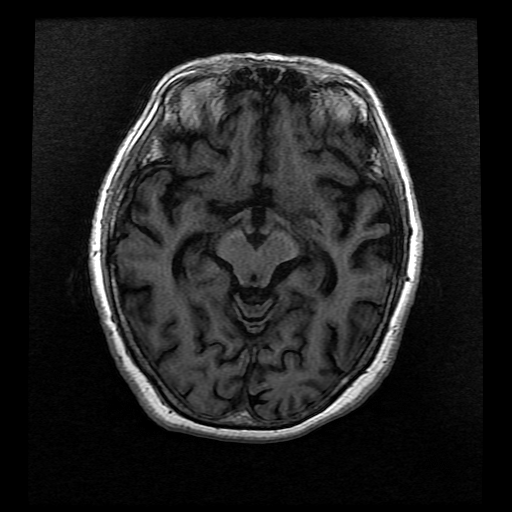

[31 of 48 positions shown; findings below may reference images not displayed]

FINDINGS: Brain: There is a small linear 5-6 millimeter infarct with
restricted diffusion in the lateral medulla on series 3, image 13
and series 4, image 21. Subtle associated T2 hyperintensity. No
hemorrhage or mass effect.

No other restricted diffusion. No midline shift, mass effect,
evidence of mass lesion, ventriculomegaly, extra-axial collection or
acute intracranial hemorrhage. Cervicomedullary junction and
pituitary are within normal limits.

Patchy bilateral cerebral white matter T2 and FLAIR hyperintensity,
which at the anterior left centrum semiovale on series 7, image 20
most resembles chronic lacunar infarction. No cortical
encephalomalacia or chronic cerebral blood products identified.
Negative deep gray nuclei. The cerebellum remains within normal
limits.

Vascular: Major intracranial vascular flow voids are preserved, the
left vertebral artery appears dominant.

Skull and upper cervical spine: Negative for age visible cervical
spine. Normal bone marrow signal.

Sinuses/Orbits: Postoperative changes to both globes, otherwise
negative orbits. Paranasal Visualized paranasal sinuses and mastoids
are stable and well pneumatized.

Other: Visible internal auditory structures appear normal. Scalp and
face soft tissues appear negative.
IMPRESSION: 1. Positive for a small left lateral medullary lacunar infarct. No
associated hemorrhage or mass effect.
2. No other acute intracranial abnormality. Cerebral white matter
signal changes suggesting chronic small vessel disease.

## 2020-11-08 ENCOUNTER — Other Ambulatory Visit: Payer: Self-pay

## 2020-11-08 MED ORDER — GLUCOSE BLOOD VI STRP: ORAL_STRIP | 12 refills | Status: DC

## 2020-12-07 ENCOUNTER — Other Ambulatory Visit: Payer: Self-pay | Admitting: Family Medicine

## 2021-01-05 ENCOUNTER — Other Ambulatory Visit: Payer: Self-pay | Admitting: Family Medicine

## 2021-01-07 ENCOUNTER — Telehealth: Payer: Self-pay | Admitting: Family Medicine

## 2021-01-07 NOTE — Telephone Encounter (Signed)
Left message for patient to call back and reschedule Medicare Annual Wellness Visit (AWV) in office on 03/24/21. Please reschedule to after 03/18/2021.  If not able to come in office, please offer to do virtually or by telephone.  Left office number and my jabber [phone].  Last AWV:03/18/2020  Please schedule at anytime with Nurse Health Advisor.

## 2021-01-14 ENCOUNTER — Other Ambulatory Visit: Payer: Self-pay | Admitting: Family Medicine

## 2021-01-14 DIAGNOSIS — I10 Essential (primary) hypertension: Secondary | ICD-10-CM

## 2021-01-18 ENCOUNTER — Encounter: Payer: Self-pay | Admitting: Family Medicine

## 2021-01-26 ENCOUNTER — Encounter: Payer: Self-pay | Admitting: Family Medicine

## 2021-01-26 ENCOUNTER — Other Ambulatory Visit: Payer: Self-pay | Admitting: Family Medicine

## 2021-01-26 ENCOUNTER — Other Ambulatory Visit: Payer: Self-pay

## 2021-01-26 MED ORDER — GLUCOSE BLOOD VI STRP: ORAL_STRIP | 12 refills | Status: DC

## 2021-01-26 MED ORDER — ONETOUCH ULTRA MINI W/DEVICE KIT: PACK | 0 refills | Status: DC

## 2021-01-26 MED ORDER — LANCETS MISC: 1.0000 | Freq: Three times a day (TID) | 0 refills | Status: DC

## 2021-01-26 MED ORDER — CLOPIDOGREL BISULFATE 75 MG PO TABS: 75.0000 mg | ORAL_TABLET | Freq: Every day | ORAL | 0 refills | Status: DC

## 2021-01-28 ENCOUNTER — Other Ambulatory Visit: Payer: Self-pay | Admitting: Family Medicine

## 2021-02-13 ENCOUNTER — Other Ambulatory Visit: Payer: Self-pay | Admitting: Family Medicine

## 2021-02-13 DIAGNOSIS — I10 Essential (primary) hypertension: Secondary | ICD-10-CM

## 2021-02-14 ENCOUNTER — Other Ambulatory Visit: Payer: Self-pay | Admitting: Family Medicine

## 2021-02-14 DIAGNOSIS — I10 Essential (primary) hypertension: Secondary | ICD-10-CM

## 2021-03-08 ENCOUNTER — Other Ambulatory Visit: Payer: Self-pay | Admitting: Family Medicine

## 2021-03-08 DIAGNOSIS — I10 Essential (primary) hypertension: Secondary | ICD-10-CM

## 2021-03-09 ENCOUNTER — Encounter: Payer: Self-pay | Admitting: Family Medicine

## 2021-03-09 ENCOUNTER — Other Ambulatory Visit: Payer: Self-pay | Admitting: Family Medicine

## 2021-03-09 DIAGNOSIS — Z794 Long term (current) use of insulin: Secondary | ICD-10-CM

## 2021-03-09 DIAGNOSIS — E11319 Type 2 diabetes mellitus with unspecified diabetic retinopathy without macular edema: Secondary | ICD-10-CM

## 2021-03-09 MED ORDER — HYDROCHLOROTHIAZIDE 25 MG PO TABS: 25.0000 mg | ORAL_TABLET | Freq: Every day | ORAL | 0 refills | Status: DC

## 2021-03-09 MED ORDER — CLOPIDOGREL BISULFATE 75 MG PO TABS: 75.0000 mg | ORAL_TABLET | Freq: Every day | ORAL | 0 refills | Status: DC

## 2021-03-10 MED ORDER — GLIPIZIDE ER 5 MG PO TB24
ORAL_TABLET | ORAL | 0 refills | Status: DC
Start: 2021-03-10 — End: 2021-03-16

## 2021-03-10 MED ORDER — GLUCOSE BLOOD VI STRP: ORAL_STRIP | 12 refills | Status: DC

## 2021-03-13 NOTE — Progress Notes (Addendum)
[place] [facility], [place], [place] [address] [phone]  Date:  03/16/2021   Name:  [name]   DOB:  03-14-1940   MRN:  [id number]  PCP:  Darreld Mclean, MD    Chief Complaint: Follow-up (Concerns/ questions: none/--Immunizations utd with ncir--)   History of Present Illness:  Wanda [name] is a 81 y.o. very pleasant female patient who presents with the following:  Patient seen today for a follow-up visit Most recent visit with myself was about 1 year ago  History of CVA 09/2018, carotid stenosis, diabetes, hypertension, hyperlipidemia She has been evaluated by vascular surgery, status post left endarterectomy in October for carotid stenosis Last year she noted some symptoms of claudication We had her do some physical therapy to work on gait stability-she is not sure how much benefit she received  Tye [place] her daughter is here today - her daughter noted her mother is really tired out, low energy She is having a hard time with her knee pain as well Her knees were done in 2017/18 by Dr Berenice Primas  She did try tyelnol which helps with her pain.  She is concerned if she uses Tylenol too much she will develop a tolerance to it.  As this is likely untrue, it would be okay for her to use Tylenol regularly for her knee pain  Offer pneumonia vaccine- declines  Tetanus can be updated- pt declines but she is reminded to have this done if any significant artery wound Foot exam is due Eye exam- she is overdue  Due for updated blood work  She does check her blood sugars at home- tend to run about 130- 200 No lows noted   Amlodipine Clonidine Plavix HCTZ 25 Glipizide XL 10 AM, 5 PM Lantus- taking 20 units daily   She has given up driving due to difficulty getting to her car, no issues with actually driving  Patient Active Problem List   Diagnosis Date Noted   Carotid stenosis 11/13/2018   CVA  (cerebral vascular accident) ([place]) 09/19/2018   Brainstem infarct, acute ([place])    Primary osteoarthritis of left knee 10/04/2015   Osteoporosis 08/23/2015   Multinodular goiter (nontoxic) 04/07/2014   Controlled type 2 diabetes with retinopathy ([place]) 10/09/2013   Insulin adverse reaction 09/27/2013   Senile nuclear sclerosis 08/14/2012   Essential hypertension 03/10/2011   Hyperlipidemia with target LDL less than 70 03/10/2011   Obesity (BMI 30.0-34.9) 03/10/2011    Past Medical History:  Diagnosis Date   Arthritis    Carotid stenosis 11/2018   Cataract    surgery to remove   Diabetes mellitus    type 2   Hyperlipidemia    Hypertension    Skin cancer    Removed from face   Stroke ([place]) 09/19/2018   Urgency of urination    Urinary leakage     Past Surgical History:  Procedure Laterality Date   ABDOMINAL HYSTERECTOMY  early 80's   total   CAROTID ENDARTERECTOMY Left 11/13/2018   CARPAL TUNNEL RELEASE Bilateral 1980 and 1981   both hands   ENDARTERECTOMY Left 11/13/2018   Procedure: Left Carotid Artery Endarterectomy with Patch Angioplasty;  Surgeon: Rosetta Posner, MD;  Location: [facility] OR;  Service: Vascular;  Laterality: Left;   EYE SURGERY     bilateral cataracts   Fatty Tumor Excision     JOINT REPLACEMENT     TONSILLECTOMY  TONSILLECTOMY AND ADENOIDECTOMY  age 5   TOTAL KNEE ARTHROPLASTY Right 10/04/2015   TOTAL KNEE ARTHROPLASTY Right 10/04/2015   Procedure: TOTAL KNEE ARTHROPLASTY;  Surgeon: Dorna Leitz, MD;  Location: [facility] [place];  Service: Orthopedics;  Laterality: Right;   TOTAL KNEE ARTHROPLASTY Left 01/28/2016   Procedure: TOTAL KNEE ARTHROPLASTY;  Surgeon: Dorna Leitz, MD;  Location: [facility];  Service: Orthopedics;  Laterality: Left;   TUBAL LIGATION      Social History   Tobacco Use   Smoking status: Never   Smokeless tobacco: Never  Vaping Use   Vaping Use: Never used  Substance Use Topics   Alcohol use: Yes    Alcohol/week: 1.0 standard drink    Types:  1 Standard drinks or equivalent per week    Comment: maybe once per month - 3 drinks   Drug use: No    Family History  Problem Relation Age of Onset   Pancreatitis Father        deceased 28   Cancer Other    Cancer Brother        GI   Cancer Mother        liver   Breast cancer Mother 47   Cancer Son        terminal kidney   Breast cancer Maternal Aunt     Allergies  Allergen Reactions   Ace Inhibitors Diarrhea, Swelling, Other (See Comments) and Cough    Pt had cough and diarrhea with first few doses of medication; also had swelling of right eyelid. Stopped medication on 03/17/11.   Alendronate Sodium Other (See Comments)    "Made my whole body hurt"   Aspirin Hives   Crestor [Rosuvastatin] Other (See Comments)    "Made my whole body hurt"   Lipitor [Atorvastatin Calcium] Other (See Comments)    "Made my whole body hurt"   Metformin And Related     Severe abdominal pain   Shellfish Allergy Nausea Only    Intolerant of fresh shellfish, reports the reaction is GI upset, denies hives, denies any swelling  Reports that she can tolerate canned seafood.     Medication list has been reviewed and updated.  Current Outpatient Medications on File Prior to Visit  Medication Sig Dispense Refill   amLODipine (NORVASC) 5 MG tablet TAKE 1 TABLET(5 MG) BY MOUTH DAILY 30 tablet 0   blood glucose meter kit and supplies Dispense based on patient and insurance preference. Pt just needs meter 1 each 0   Blood Glucose Monitoring Suppl (ONE TOUCH ULTRA MINI) w/Device KIT Use to test blood sugar daily as instructed. Dx: E11.65 1 kit 0   cloNIDine (CATAPRES) 0.1 MG tablet Take 1 tablet (0.1 mg total) by mouth 2 (two) times daily. 60 tablet 0   clopidogrel (PLAVIX) 75 MG tablet Take 1 tablet (75 mg total) by mouth daily. 30 tablet 0   glipiZIDE (GLUCOTROL XL) 5 MG 24 hr tablet Take 2 tabs in the morning and 1 tab in the evening. 270 tablet 0   glucose blood (ONE TOUCH ULTRA TEST) test strip Test  blood sugar 3 times a day. Dx code: 250.00 100 each 12   hydrochlorothiazide (HYDRODIURIL) 25 MG tablet Take 1 tablet (25 mg total) by mouth daily. 30 tablet 0   insulin glargine (LANTUS SOLOSTAR) 100 UNIT/ML Solostar Pen ADMINISTER 15 TO 20 UNITS UNDER THE SKIN DAILY 15 mL 1   Insulin Pen Needle (BD PEN NEEDLE NANO U/F) 32G X 4 MM MISC USE TO INJECT  INSULIN ONCE DAILY 100 each 6   Insulin Syringe-Needle U-100 (INSULIN SYRINGE .5CC/31GX5/16") 31G X 5/16" 0.5 ML MISC Use to inject insulin 1 time daily. 90 each 3   Lancets MISC 1 each by Does not apply route 3 (three) times daily. Dx: E11.65 100 each 0   [DISCONTINUED] lisinopril (PRINIVIL,ZESTRIL) 20 MG tablet Take 20 mg by mouth daily.      No current facility-administered medications on file prior to visit.    Review of Systems:  As per HPI- otherwise negative.   Physical Examination: Vitals:   03/16/21 1403  BP: 140/80  Pulse: 94  Resp: 18  Temp: 97.6 F (36.4 C)  SpO2: 99%   Vitals:   03/16/21 1403  Weight: 181 lb 3.2 oz (82.2 kg)  Height: _0  (1.549 m)   Body mass index is 34.24 kg/m. Ideal Body Weight: Weight in (lb) to have BMI = 25: 132  GEN: no acute distress.  Mildly obese, looks well HEENT: Atraumatic, Normocephalic.  Thyroid enlargement without nodule Ears and Nose: No external deformity. CV: RRR, No M/G/R. No JVD. No thrill. No extra heart sounds. PULM: CTA B, no wheezes, crackles, rhonchi. No retractions. No resp. distress. No accessory muscle use. ABD: S, NT, ND, +BS. No rebound. No HSM. EXTR: No c/c/e PSYCH: Normally interactive. Conversant.  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, with long-term current use of insulin, macular edema presence unspecified, unspecified retinopathy severity ([place]) - Plan: Comprehensive metabolic panel, Hemoglobin A1c, glipiZIDE (GLUCOTROL XL) 5 MG 24 hr tablet, insulin glargine (LANTUS  SOLOSTAR) 100 UNIT/ML Solostar Pen, glucose blood (ONE TOUCH ULTRA TEST) test strip  Intermittent claudication ([facility])  Essential hypertension - Plan: CBC, Comprehensive metabolic panel, amLODipine (NORVASC) 5 MG tablet  Cerebrovascular accident (CVA) due to thrombosis of basilar artery ([place]) - Plan: clopidogrel (PLAVIX) 75 MG tablet  Hyperlipidemia with target LDL less than 70 - Plan: Lipid panel  Screening for thyroid disorder - Plan: TSH  Primary hypertension - Plan: cloNIDine (CATAPRES) 0.1 MG tablet, hydrochlorothiazide (HYDRODIURIL) 25 MG tablet  Other fatigue - Plan: B12  Gait instability - Plan: Ambulatory referral to Home Health  Thyroid enlargement - Plan: US THYROID  Patient seen today for follow-up.  I have not seen her in about a year Labs pending to monitor diabetes, provided refills as above She continues to take Plavix for history of CVA She is not taking a statin due to intolerance She has not had a thyroid ultrasound in several years, ordered for her today Also ordered home health physical therapy Will plan further follow- up pending labs.  signed Lamar Blinks, MD   Addendum 3/9, received labs as below.  Message to patient  Results for orders placed or performed in visit on 03/16/21  CBC  Result Value Ref Range   WBC 7.4 4.0 - 10.5 K/uL   RBC 4.49 3.87 - 5.11 Mil/uL   Platelets 281.0 150.0 - 400.0 K/uL   Hemoglobin 13.4 12.0 - 15.0 g/dL   HCT 40.4 36.0 - 46.0 %   MCV 90.0 78.0 - 100.0 fl   MCHC 33.3 30.0 - 36.0 g/dL   RDW 12.9 11.5 - 15.5 %  Comprehensive metabolic panel  Result Value Ref Range   Sodium 136 135 - 145 mEq/L   Potassium 3.8 3.5 - 5.1 mEq/L   Chloride 98 96 - 112 mEq/L   CO2 26 19 - 32 mEq/L   Glucose, Bld  292 (H) 70 - 99 mg/dL   BUN 15 6 - 23 mg/dL   Creatinine, Ser 0.94 0.40 - 1.20 mg/dL   Total Bilirubin 0.6 0.2 - 1.2 mg/dL   Alkaline Phosphatase 51 39 - 117 U/L   AST 15 0 - 37 U/L   ALT 19 0 - 35 U/L   Total Protein 7.7  6.0 - 8.3 g/dL   Albumin 4.5 3.5 - 5.2 g/dL   GFR 57.04 (L) >60.00 mL/min   Calcium 10.2 8.4 - 10.5 mg/dL  Hemoglobin A1c  Result Value Ref Range   Hgb A1c MFr Bld 11.8 (H) 4.6 - 6.5 %  Lipid panel  Result Value Ref Range   Cholesterol 262 (H) 0 - 200 mg/dL   Triglycerides 353.0 (H) 0.0 - 149.0 mg/dL   HDL 42.80 >39.00 mg/dL   VLDL 70.6 (H) 0.0 - 40.0 mg/dL   Total CHOL/HDL Ratio 6    NonHDL 219.29   TSH  Result Value Ref Range   TSH 1.78 0.35 - 5.50 uIU/mL  B12  Result Value Ref Range   Vitamin B-12 800 211 - 911 pg/mL  LDL cholesterol, direct  Result Value Ref Range   Direct LDL 170.0 mg/dL

## 2021-03-16 ENCOUNTER — Encounter: Payer: Self-pay | Admitting: Family Medicine

## 2021-03-16 ENCOUNTER — Ambulatory Visit (INDEPENDENT_AMBULATORY_CARE_PROVIDER_SITE_OTHER): Payer: Medicare Other | Admitting: Family Medicine

## 2021-03-16 VITALS — BP 140/80 | HR 94 | Temp 97.6°F | Resp 18 | Ht 61.0 in | Wt 181.2 lb

## 2021-03-16 DIAGNOSIS — E11319 Type 2 diabetes mellitus with unspecified diabetic retinopathy without macular edema: Secondary | ICD-10-CM

## 2021-03-16 DIAGNOSIS — I10 Essential (primary) hypertension: Secondary | ICD-10-CM | POA: Diagnosis not present

## 2021-03-16 DIAGNOSIS — I6302 Cerebral infarction due to thrombosis of basilar artery: Secondary | ICD-10-CM | POA: Diagnosis not present

## 2021-03-16 DIAGNOSIS — I739 Peripheral vascular disease, unspecified: Secondary | ICD-10-CM

## 2021-03-16 DIAGNOSIS — Z794 Long term (current) use of insulin: Secondary | ICD-10-CM

## 2021-03-16 DIAGNOSIS — E049 Nontoxic goiter, unspecified: Secondary | ICD-10-CM

## 2021-03-16 DIAGNOSIS — M170 Bilateral primary osteoarthritis of knee: Secondary | ICD-10-CM | POA: Diagnosis not present

## 2021-03-16 DIAGNOSIS — E785 Hyperlipidemia, unspecified: Secondary | ICD-10-CM

## 2021-03-16 DIAGNOSIS — R2681 Unsteadiness on feet: Secondary | ICD-10-CM

## 2021-03-16 DIAGNOSIS — Z1329 Encounter for screening for other suspected endocrine disorder: Secondary | ICD-10-CM | POA: Diagnosis not present

## 2021-03-16 DIAGNOSIS — R5383 Other fatigue: Secondary | ICD-10-CM | POA: Diagnosis not present

## 2021-03-16 MED ORDER — HYDROCHLOROTHIAZIDE 25 MG PO TABS: 25.0000 mg | ORAL_TABLET | Freq: Every day | ORAL | 3 refills | Status: DC

## 2021-03-16 MED ORDER — CLONIDINE HCL 0.1 MG PO TABS
0.1000 mg | ORAL_TABLET | Freq: Two times a day (BID) | ORAL | 3 refills | Status: DC
Start: 2021-03-16 — End: 2021-07-05

## 2021-03-16 MED ORDER — CLOPIDOGREL BISULFATE 75 MG PO TABS
75.0000 mg | ORAL_TABLET | Freq: Every day | ORAL | 3 refills | Status: DC
Start: 2021-03-16 — End: 2021-12-02

## 2021-03-16 MED ORDER — LANTUS SOLOSTAR 100 UNIT/ML ~~LOC~~ SOPN
PEN_INJECTOR | SUBCUTANEOUS | 3 refills | Status: DC
Start: 2021-03-16 — End: 2021-07-29

## 2021-03-16 MED ORDER — AMLODIPINE BESYLATE 5 MG PO TABS: ORAL_TABLET | ORAL | 3 refills | Status: DC

## 2021-03-16 MED ORDER — GLIPIZIDE ER 5 MG PO TB24
ORAL_TABLET | ORAL | 3 refills | Status: DC
Start: 2021-03-16 — End: 2023-02-07

## 2021-03-16 MED ORDER — GLUCOSE BLOOD VI STRP: ORAL_STRIP | 12 refills | Status: DC

## 2021-03-16 NOTE — Patient Instructions (Addendum)
It was good to see you again today- I will be in touch with your labs ?I ordered home health physical therapy to work with you on strength and balance ?Assuming all is well please see me in about 6 months  ?

## 2021-03-17 ENCOUNTER — Encounter: Payer: Self-pay | Admitting: Family Medicine

## 2021-03-17 LAB — LIPID PANEL
Cholesterol: 262 mg/dL — ABNORMAL HIGH (ref 0–200)
HDL: 42.8 mg/dL (ref >39.00)
NonHDL: 219.29
Total CHOL/HDL Ratio: 6
Triglycerides: 353 mg/dL — ABNORMAL HIGH (ref 0.0–149.0)
VLDL: 70.6 mg/dL — ABNORMAL HIGH (ref 0.0–40.0)

## 2021-03-17 LAB — COMPREHENSIVE METABOLIC PANEL
ALT: 19 U/L (ref 0–35)
AST: 15 U/L (ref 0–37)
Albumin: 4.5 g/dL (ref 3.5–5.2)
Alkaline Phosphatase: 51 U/L (ref 39–117)
BUN: 15 mg/dL (ref 6–23)
CO2: 26 mEq/L (ref 19–32)
Calcium: 10.2 mg/dL (ref 8.4–10.5)
Chloride: 98 mEq/L (ref 96–112)
Creatinine, Ser: 0.94 mg/dL (ref 0.40–1.20)
GFR: 57.04 mL/min — ABNORMAL LOW (ref >60.00)
Glucose, Bld: 292 mg/dL — ABNORMAL HIGH (ref 70–99)
Potassium: 3.8 mEq/L (ref 3.5–5.1)
Sodium: 136 mEq/L (ref 135–145)
Total Bilirubin: 0.6 mg/dL (ref 0.2–1.2)
Total Protein: 7.7 g/dL (ref 6.0–8.3)

## 2021-03-17 LAB — HEMOGLOBIN A1C: Hgb A1c MFr Bld: 11.8 % — ABNORMAL HIGH (ref 4.6–6.5)

## 2021-03-17 LAB — TSH: TSH: 1.78 u[IU]/mL (ref 0.35–5.50)

## 2021-03-17 LAB — CBC
HCT: 40.4 % (ref 36.0–46.0)
Hemoglobin: 13.4 g/dL (ref 12.0–15.0)
MCHC: 33.3 g/dL (ref 30.0–36.0)
MCV: 90 fl (ref 78.0–100.0)
Platelets: 281 10*3/uL (ref 150.0–400.0)
RBC: 4.49 Mil/uL (ref 3.87–5.11)
RDW: 12.9 % (ref 11.5–15.5)
WBC: 7.4 10*3/uL (ref 4.0–10.5)

## 2021-03-17 LAB — LDL CHOLESTEROL, DIRECT: Direct LDL: 170 mg/dL

## 2021-03-17 LAB — VITAMIN B12: Vitamin B-12: 800 pg/mL (ref 211–911)

## 2021-03-18 ENCOUNTER — Ambulatory Visit (HOSPITAL_BASED_OUTPATIENT_CLINIC_OR_DEPARTMENT_OTHER): Admission: RE | Admit: 2021-03-18 | Payer: Medicare Other | Source: Ambulatory Visit

## 2021-03-21 ENCOUNTER — Telehealth (HOSPITAL_BASED_OUTPATIENT_CLINIC_OR_DEPARTMENT_OTHER): Payer: Self-pay

## 2021-03-24 ENCOUNTER — Ambulatory Visit: Payer: Medicare Other

## 2021-03-25 ENCOUNTER — Telehealth: Payer: Self-pay | Admitting: Family Medicine

## 2021-03-25 NOTE — Telephone Encounter (Signed)
Crystal calling from [facility] regarding referral for PT ? ?[place] stated referral was sent over for PT for pt but no physical orders were attached  ? ? ?[place]- [phone] ?

## 2021-03-25 NOTE — Telephone Encounter (Signed)
Im unsure how to place this. But PT order is needed.  ?

## 2021-03-28 NOTE — Telephone Encounter (Signed)
Physical therapy was ordered through epic as usual at patient visit on 3/8 ?I have called the provided home health number twice and left 2 messages.  I am uncertain what they need.  We will get in touch with referrals to see if they can help ?Gwen-please see message.  I am unsure what " physical orders" they need?  ?

## 2021-03-31 ENCOUNTER — Ambulatory Visit: Payer: Medicare Other

## 2021-03-31 NOTE — Progress Notes (Deleted)
Subjective:   Wanda [name] is a 81 y.o. female who presents for Medicare Annual (Subsequent) preventive examination.  Review of Systems    ***       Objective:    There were no vitals filed for this visit. There is no height or weight on file to calculate BMI.     03/29/2020    1:12 PM 03/18/2020   12:47 PM 03/10/2019   11:35 AM 11/14/2018    8:01 AM 11/06/2018   11:13 AM 10/29/2018    9:34 AM 09/20/2018    5:06 AM  Advanced Directives  Does Patient Have a Medical Advance Directive? No No No No No No   Does patient want to make changes to medical advance directive?   No - Patient declined      Would patient like information on creating a medical advance directive? No - Patient declined No - Patient declined  No - Patient declined No - Patient declined No - Patient declined No - Patient declined    Current Medications (verified) Outpatient Encounter Medications as of 03/31/2021  Medication Sig   amLODipine (NORVASC) 5 MG tablet TAKE 1 TABLET(5 MG) BY MOUTH DAILY   blood glucose meter kit and supplies Dispense based on patient and insurance preference. Pt just needs meter   Blood Glucose Monitoring Suppl (ONE TOUCH ULTRA MINI) w/Device KIT Use to test blood sugar daily as instructed. Dx: E11.65   cloNIDine (CATAPRES) 0.1 MG tablet Take 1 tablet (0.1 mg total) by mouth 2 (two) times daily.   clopidogrel (PLAVIX) 75 MG tablet Take 1 tablet (75 mg total) by mouth daily.   glipiZIDE (GLUCOTROL XL) 5 MG 24 hr tablet Take 2 tabs in the morning and 1 tab in the evening.   glucose blood (ONE TOUCH ULTRA TEST) test strip Test blood sugar 3 times a day. Dx code: 250.00   hydrochlorothiazide (HYDRODIURIL) 25 MG tablet Take 1 tablet (25 mg total) by mouth daily.   insulin glargine (LANTUS SOLOSTAR) 100 UNIT/ML Solostar Pen ADMINISTER 20 UNITS UNDER THE SKIN DAILY   Insulin Pen Needle (BD PEN NEEDLE NANO U/F) 32G X 4 MM MISC USE TO INJECT INSULIN ONCE DAILY   Insulin Syringe-Needle  U-100 (INSULIN SYRINGE .5CC/31GX5/16") 31G X 5/16" 0.5 ML MISC Use to inject insulin 1 time daily.   Lancets MISC 1 each by Does not apply route 3 (three) times daily. Dx: E11.65   [DISCONTINUED] lisinopril (PRINIVIL,ZESTRIL) 20 MG tablet Take 20 mg by mouth daily.    No facility-administered encounter medications on file as of 03/31/2021.    Allergies (verified) Ace inhibitors, Alendronate sodium, Aspirin, Crestor [rosuvastatin], Lipitor [atorvastatin calcium], Metformin and related, and Shellfish allergy   History: Past Medical History:  Diagnosis Date   Arthritis    Carotid stenosis 11/2018   Cataract    surgery to remove   Diabetes mellitus    type 2   Hyperlipidemia    Hypertension    Skin cancer    Removed from face   Stroke (HCC) 09/19/2018   Urgency of urination    Urinary leakage    Past Surgical History:  Procedure Laterality Date   ABDOMINAL HYSTERECTOMY  early 80's   total   CAROTID ENDARTERECTOMY Left 11/13/2018   CARPAL TUNNEL RELEASE Bilateral 1980 and 1981   both hands   ENDARTERECTOMY Left 11/13/2018   Procedure: Left Carotid Artery Endarterectomy with Patch Angioplasty;  Surgeon: Larina Earthly, MD;  Location: [facility] OR;  Service: Vascular;  Laterality:  Left;   EYE SURGERY     bilateral cataracts   Fatty Tumor Excision     JOINT REPLACEMENT     TONSILLECTOMY     TONSILLECTOMY AND ADENOIDECTOMY  age 10   TOTAL KNEE ARTHROPLASTY Right 10/04/2015   TOTAL KNEE ARTHROPLASTY Right 10/04/2015   Procedure: TOTAL KNEE ARTHROPLASTY;  Surgeon: Jodi Geralds, MD;  Location: [facility] OR;  Service: Orthopedics;  Laterality: Right;   TOTAL KNEE ARTHROPLASTY Left 01/28/2016   Procedure: TOTAL KNEE ARTHROPLASTY;  Surgeon: Jodi Geralds, MD;  Location: [facility] OR;  Service: Orthopedics;  Laterality: Left;   TUBAL LIGATION     Family History  Problem Relation Age of Onset   Pancreatitis Father        deceased 79   Cancer Other    Cancer Brother        GI   Cancer Mother        liver    Breast cancer Mother 41   Cancer Son        terminal kidney   Breast cancer Maternal Aunt    Social History   Socioeconomic History   Marital status: Widowed    Spouse name: Not on file   Number of children: Not on file   Years of education: Not on file   Highest education level: Not on file  Occupational History   Not on file  Tobacco Use   Smoking status: Never   Smokeless tobacco: Never  Vaping Use   Vaping Use: Never used  Substance and Sexual Activity   Alcohol use: Yes    Alcohol/week: 1.0 standard drink    Types: 1 Standard drinks or equivalent per week    Comment: maybe once per month - 3 drinks   Drug use: No   Sexual activity: Not Currently    Birth control/protection: Post-menopausal    Comment: Hysterectomy  Other Topics Concern   Not on file  Social History Narrative   Widowed. Education: [employer]. Exercise: 2 times a week for 30 minutes.   Social Determinants of Health   Financial Resource Strain: Not on file  Food Insecurity: Not on file  Transportation Needs: Not on file  Physical Activity: Not on file  Stress: Not on file  Social Connections: Not on file    Tobacco Counseling Counseling given: Not Answered   Clinical Intake:                 Diabetic?Yes  Nutrition Risk Assessment:  Has the patient had any N/V/D within the last 2 months?  {YES/NO:21197} Does the patient have any non-healing wounds?  {YES/NO:21197} Has the patient had any unintentional weight loss or weight gain?  {YES/NO:21197}  Diabetes:  Is the patient diabetic?  {YES/NO:21197} If diabetic, was a CBG obtained today?  {YES/NO:21197} Did the patient bring in their glucometer from home?  {YES/NO:21197} How often do you monitor your CBG's? ***.   Financial Strains and Diabetes Management:  Are you having any financial strains with the device, your supplies or your medication? {YES/NO:21197}.  Does the patient want to be seen by Chronic Care Management for  management of their diabetes?  {YES/NO:21197} Would the patient like to be referred to a Nutritionist or for Diabetic Management?  {YES/NO:21197}  Diabetic Exams:  {Diabetic Eye Exam:2101801} {Diabetic Foot Exam:2101802}          Activities of Daily Living     View : No data to display.          Patient Care  Team: Copland, Gwenlyn Found, MD as PCP - General (Family Medicine) Sanjuana Letters, MD as Referring Physician (Orthopedic Surgery)  Indicate any recent Medical Services you may have received from other than [facility] providers in the past year (date may be approximate).     Assessment:   This is a routine wellness examination for Nicloe.  Hearing/Vision screen No results found.  Dietary issues and exercise activities discussed:     Goals Addressed   None    Depression Screen    03/18/2020   12:48 PM 03/10/2019   11:41 AM 03/04/2018   11:07 AM 03/01/2017    1:35 PM 10/18/2015   11:31 AM 10/07/2014   10:26 AM 09/26/2013   11:02 AM  PHQ 2/9 Scores  PHQ - 2 Score 0 0 0 0 0 0 0    Fall Risk    03/18/2020   12:47 PM 03/11/2020    1:03 PM 03/10/2019   11:41 AM 03/04/2018   11:07 AM 03/01/2017    1:35 PM  Fall Risk   Falls in the past year?  0 0 0 No  Number falls in past yr: 0 0 0    Injury with Fall? 0 0 0    Follow up Falls prevention discussed  Education provided;Falls prevention discussed      FALL RISK PREVENTION PERTAINING TO THE HOME:  Any stairs in or around the home? {YES/NO:21197} If so, are there any without handrails? {YES/NO:21197} Home free of loose throw rugs in walkways, pet beds, electrical cords, etc? {YES/NO:21197} Adequate lighting in your home to reduce risk of falls? {YES/NO:21197}  ASSISTIVE DEVICES UTILIZED TO PREVENT FALLS:  Life alert? {YES/NO:21197} Use of a cane, walker or w/c? {YES/NO:21197} Grab bars in the bathroom? {YES/NO:21197} Shower chair or bench in shower? {YES/NO:21197} Elevated toilet seat or a handicapped toilet?  {YES/NO:21197}  TIMED UP AND GO:  Was the test performed? {YES/NO:21197}.  Length of time to ambulate 10 feet: *** sec.   {Appearance of WUJW:1191478}  Cognitive Function:    03/04/2018   11:08 AM 03/01/2017    1:36 PM  MMSE - Mini Mental State Exam  Orientation to time 5 5  Orientation to Place 5 5  Registration 3 3  Attention/ Calculation 5 5  Recall 3 3  Language- name 2 objects 2 2  Language- repeat 1 1  Language- follow 3 step command 3 3  Language- read & follow direction 1 1  Write a sentence 1 1  Copy design 1 1  Total score 30 30        03/18/2020   12:54 PM  6CIT Screen  What Year? 0 points  What month? 0 points  What time? 0 points  Count back from 20 0 points  Months in reverse 0 points  Repeat phrase 0 points  Total Score 0 points    Immunizations Immunization History  Administered Date(s) Administered   Moderna Sars-Covid-2 Vaccination 04/15/2019, 05/13/2019, 12/02/2019    {TDAP status:2101805}  {Flu Vaccine status:2101806}  {Pneumococcal vaccine status:2101807}  {Covid-19 vaccine status:2101808}  Qualifies for Shingles Vaccine? {YES/NO:21197}  Zostavax completed {YES/NO:21197}  {Shingrix Completed?:2101804}  Screening Tests Health Maintenance  Topic Date Due   Zoster Vaccines- Shingrix (1 of 2) Never done   Pneumonia Vaccine 63+ Years old (1 - PCV) Never done   TETANUS/TDAP  01/09/2014   COVID-19 Vaccine (4 - Booster for Moderna series) 01/27/2020   FOOT EXAM  02/05/2020   OPHTHALMOLOGY EXAM  11/03/2020   MAMMOGRAM  03/22/2021  HEMOGLOBIN A1C  09/16/2021   DEXA SCAN  Completed   HPV VACCINES  Aged Out   INFLUENZA VACCINE  Discontinued    Health Maintenance  Health Maintenance Due  Topic Date Due   Zoster Vaccines- Shingrix (1 of 2) Never done   Pneumonia Vaccine 2+ Years old (1 - PCV) Never done   TETANUS/TDAP  01/09/2014   COVID-19 Vaccine (4 - Booster for Moderna series) 01/27/2020   FOOT EXAM  02/05/2020    OPHTHALMOLOGY EXAM  11/03/2020   MAMMOGRAM  03/22/2021    Colorectal cancer screening: No longer required.   {Mammogram status:21018020}  {Bone Density status:21018021}  Lung Cancer Screening: (Low Dose CT Chest recommended if Age 64-80 years, 30 pack-year currently smoking OR have quit w/in 15years.) does not qualify.   Lung Cancer Screening Referral: N/A  Additional Screening:  Hepatitis C Screening: does not qualify; Completed Aged out  Vision Screening: Recommended annual ophthalmology exams for early detection of glaucoma and other disorders of the eye. Is the patient up to date with their annual eye exam?  {YES/NO:21197} Who is the provider or what is the name of the office in which the patient attends annual eye exams? *** If pt is not established with a provider, would they like to be referred to a provider to establish care? {YES/NO:21197}.   Dental Screening: Recommended annual dental exams for proper oral hygiene  Community Resource Referral / Chronic Care Management: CRR required this visit?  {YES/NO:21197}  CCM required this visit?  {YES/NO:21197}     Plan:     I have personally reviewed and noted the following in the patients chart:   Medical and social history Use of alcohol, tobacco or illicit drugs  Current medications and supplements including opioid prescriptions.  Functional ability and status Nutritional status Physical activity Advanced directives List of other physicians Hospitalizations, surgeries, and ER visits in previous 12 months Vitals Screenings to include cognitive, depression, and falls Referrals and appointments  In addition, I have reviewed and discussed with patient certain preventive protocols, quality metrics, and best practice recommendations. A written personalized care plan for preventive services as well as general preventive health recommendations were provided to patient.     Salomon Mast Manaal Mandala, CMA   03/31/2021   Nurse  Notes: ***

## 2021-04-12 ENCOUNTER — Telehealth: Payer: Self-pay

## 2021-04-12 ENCOUNTER — Telehealth: Payer: Self-pay | Admitting: Family Medicine

## 2021-04-12 NOTE — Telephone Encounter (Signed)
Faxed

## 2021-04-12 NOTE — Telephone Encounter (Signed)
Called back to give verbal orders- she declined OV and ER. Please advise.  ?

## 2021-04-12 NOTE — Telephone Encounter (Signed)
[facility] is requesting physical therapy orders.  ?1x for 3 weeks ?1x every other week for 5 weeks.  ? ?

## 2021-04-12 NOTE — Telephone Encounter (Signed)
Nurse Assessment ?Nurse: Thad Ranger RN, Denise Date/Time (Eastern Time): 04/12/2021 11:23:07 AM ?Confirm and document reason for call. If ?symptomatic, describe symptoms. ?---Caller states that her blood sugar is 501. Caller is a ?doctor or PT and states the pt has been having memory ?issues but is NOT confused. ?Does the patient have any new or worsening ?symptoms? ---Yes ?Will a triage be completed? ---Yes ?Related visit to physician within the last 2 weeks? ---No ?Does the PT have any chronic conditions? (i.e. ?diabetes, asthma, this includes High risk factors for ?pregnancy, etc.) ?---Yes ?List chronic conditions. ---Knee pain/weakness; Diabetes; CVA; HTN ?Is this a behavioral health or substance abuse call? ---No ?Guidelines ?Guideline Title Affirmed Question Affirmed Notes Nurse Date/Time (Eastern ?Time) ?Diabetes - High ?Blood Sugar ?Blood glucose > 500 ?mg/dL (27.8 mmol/L) ?Thad Ranger, RN, Langley Gauss 04/12/2021 11:25:18 ?AM ?Disp. Time (Eastern ?Time) Disposition Final User ?04/12/2021 11:20:29 AM Send to Urgent Queue Soyla Murphy ?04/12/2021 11:27:30 AM Go to ED Now (or PCP triage) Yes Carmon, RN, Langley Gauss ?Caller Disagree/Comply Comply ?PLEASE NOTE: All timestamps contained within this report are represented as [place] Standard Time. ?CONFIDENTIALTY NOTICE: This fax transmission is intended only for the addressee. It contains information that is legally privileged, confidential or ?otherwise protected from use or disclosure. If you are not the intended recipient, you are strictly prohibited from reviewing, disclosing, copying using ?or disseminating any of this information or taking any action in reliance on or regarding this information. If you have received this fax in error, please ?notify us immediately by telephone so that we can arrange for its return to [place]. Phone: [phone], Toll-Free: [phone], Fax: [phone] ?Page: 2 of 2 ?Call Id: [id number] ?Caller Understands Yes ?PreDisposition Call Doctor ?Care  Advice Given Per Guideline ?GO TO ED NOW (OR PCP TRIAGE): BRING MEDICINES: * Please bring a list of your current medicines when you go to see the ?doctor. CARE ADVICE given per Diabetes - High Blood Sugar (Adult) guideline. ANOTHER ADULT SHOULD DRIVE: * It is ?better and safer if another adult drives instead of you. ?Referrals ?REFERRED TO PCP OFFICE ?

## 2021-04-12 NOTE — Telephone Encounter (Signed)
Orders given.  

## 2021-04-12 NOTE — Telephone Encounter (Signed)
BJ with [facility] called stating she needs more information for the referral on gait instability.  She needs to know the exact etiology that is causing the instability with approved PDGM diagnosis.  This will need to be an addendum on the 03/16/2021 MD note and faxed to [facility] at [phone], Attn:  BJ.  BJ's CB # is [phone]. ?

## 2021-04-12 NOTE — Telephone Encounter (Signed)
Wanda [name] the PT from [place] called stating the patient's sugar was 501 and she was triaged to the Nurse. They were transferred back to [place] wanting the pt to be seen today per triage nurse but patient refused to come in stating she does not need to and her sugar is fine. Wanda [name] wanted to make Dr. Lorelei Pont aware of pt's sugar and refusal to come in. She would also like a verbal order for a skill nursing assesment so they can help the pt manage her diabetes. Her number is [phone]. Please advise.  ?

## 2021-04-12 NOTE — Telephone Encounter (Signed)
Called pt back- she reports that frequently has glucose about 500 after eating. She thought this was ok. She feels fine  ?Glipizide 10 am and 5 pm ?Lantus- she is taking 35 units currently - however yesterday she took 20 units instead -She states she did this because her evening blood sugar was about 320.  Educated patient that a glucose over 300 is still quite high and does not mean she should dramatically decrease her insulin ?Asked her to increase insulin by 2 units every 2 days until FBS 150 or she reaches 45 units ?Made an appt to see her in 2 weeks  ? ? ?

## 2021-04-23 ENCOUNTER — Encounter: Payer: Self-pay | Admitting: Family Medicine

## 2021-04-26 ENCOUNTER — Telehealth: Payer: Self-pay | Admitting: Family Medicine

## 2021-04-26 NOTE — Telephone Encounter (Signed)
[facility] called to let pcp know that pt refused PT services and has not been seen since initial visit.  ?

## 2021-04-26 NOTE — Telephone Encounter (Signed)
FYI

## 2021-04-30 NOTE — Progress Notes (Signed)
Therapist, music at [facility] ?[address], [address] ?[place], [place] [address] ?336 [phone] ?Fax [phone] ? ?Date:  05/04/2021  ? ?Name:  Wanda [name]   DOB:  1940/07/22   MRN:  [id number] ? ?PCP:  Darreld Mclean, MD  ? ? ?Chief Complaint: elevated blood sugars (Readings have been elevated. This morning it was 108.) ? ? ?History of Present Illness: ? ?Wanda [name] is a 81 y.o. very pleasant female patient who presents with the following: ? ?Patient seen today for follow-up ?History of CVA 09/2018, carotid stenosis, diabetes, hypertension, hyperlipidemia ?She has been evaluated by vascular surgery, status post left endarterectomy in October for carotid stenosis ? ?Most recent visit with myself was in [place] that time she was accompanied by her daughter Wanda [name].  They noted she was feeling very tired, she is having a lot of trouble with knee pain.  I did encourage her to use Tylenol as needed for knee pain, we ordered physical therapy ? ?Her last A1c was quite high 11.8-we can attend to this today ? ?Current diabetes regimen:  ?Glipizide 10 mg and 5 pm  ?She is now using 37 units of lantus  ?She cannot use metformin due to tolerance issues  ? ?Patient notes she has been much more consistent with her Lantus since our most recent visit.  They think this is why her glucose has improved.  Her glucose is generally less than 150 recently.  She is checking her glucose twice a day  ? ?We have not done an SGLT2 already as she pays out of pocket for her medications and it is too expensive for her - however today we decided to have her apply for assistance with farxiga.  If possible she will fill this rx  ? ?Reminded need for eye exam ?Declines pneumonia vaccine  ?Reminded that mammo is due ?Patient Active Problem List  ? Diagnosis Date Noted  ? Carotid stenosis 11/13/2018  ? CVA (cerebral vascular accident) ([place]) 09/19/2018  ? Brainstem infarct, acute ([place])   ? Primary osteoarthritis of  left knee 10/04/2015  ? Osteoporosis 08/23/2015  ? Multinodular goiter (nontoxic) 04/07/2014  ? Controlled type 2 diabetes with retinopathy ([place]) 10/09/2013  ? Insulin adverse reaction 09/27/2013  ? Senile nuclear sclerosis 08/14/2012  ? Essential hypertension 03/10/2011  ? Hyperlipidemia with target LDL less than 70 03/10/2011  ? Obesity (BMI 30.0-34.9) 03/10/2011  ? ? ?Past Medical History:  ?Diagnosis Date  ? Arthritis   ? Carotid stenosis 11/2018  ? Cataract   ? surgery to remove  ? Diabetes mellitus   ? type 2  ? Hyperlipidemia   ? Hypertension   ? Skin cancer   ? Removed from face  ? Stroke ([place]) 09/19/2018  ? Urgency of urination   ? Urinary leakage   ? ? ?Past Surgical History:  ?Procedure Laterality Date  ? ABDOMINAL HYSTERECTOMY  early 61's  ? total  ? CAROTID ENDARTERECTOMY Left 11/13/2018  ? CARPAL TUNNEL RELEASE Bilateral 1980 and 1981  ? both hands  ? ENDARTERECTOMY Left 11/13/2018  ? Procedure: Left Carotid Artery Endarterectomy with Patch Angioplasty;  Surgeon: Rosetta Posner, MD;  Location: [facility];  Service: Vascular;  Laterality: Left;  ? EYE SURGERY    ? bilateral cataracts  ? Fatty Tumor Excision    ? JOINT REPLACEMENT    ? TONSILLECTOMY    ? TONSILLECTOMY AND ADENOIDECTOMY  age 66  ? TOTAL KNEE ARTHROPLASTY Right 10/04/2015  ?  TOTAL KNEE ARTHROPLASTY Right 10/04/2015  ? Procedure: TOTAL KNEE ARTHROPLASTY;  Surgeon: Dorna Leitz, MD;  Location: [facility];  Service: Orthopedics;  Laterality: Right;  ? TOTAL KNEE ARTHROPLASTY Left 01/28/2016  ? Procedure: TOTAL KNEE ARTHROPLASTY;  Surgeon: Dorna Leitz, MD;  Location: [facility];  Service: Orthopedics;  Laterality: Left;  ? TUBAL LIGATION    ? ? ?Social History  ? ?Tobacco Use  ? Smoking status: Never  ? Smokeless tobacco: Never  ?Vaping Use  ? Vaping Use: Never used  ?Substance Use Topics  ? Alcohol use: Yes  ?  Alcohol/week: 1.0 standard drink  ?  Types: 1 Standard drinks or equivalent per week  ?  Comment: maybe once per month - 3 drinks  ? Drug use: No   ? ? ?Family History  ?Problem Relation Age of Onset  ? Pancreatitis Father   ?     deceased 34  ? Cancer Other   ? Cancer Brother   ?     GI  ? Cancer Mother   ?     liver  ? Breast cancer Mother 68  ? Cancer Son   ?     terminal kidney  ? Breast cancer Maternal Aunt   ? ? ?Allergies  ?Allergen Reactions  ? Ace Inhibitors Diarrhea, Swelling, Other (See Comments) and Cough  ?  Pt had cough and diarrhea with first few doses of medication; also had swelling of right eyelid. Stopped medication on 03/17/11.  ? Alendronate Sodium Other (See Comments)  ?  "Made my whole body hurt"  ? Aspirin Hives  ? Crestor [Rosuvastatin] Other (See Comments)  ?  "Made my whole body hurt"  ? Lipitor [Atorvastatin Calcium] Other (See Comments)  ?  "Made my whole body hurt"  ? Metformin And Related   ?  Severe abdominal pain  ? Shellfish Allergy Nausea Only  ?  Intolerant of fresh shellfish, reports the reaction is GI upset, denies hives, denies any swelling  ?Reports that she can tolerate canned seafood.   ? ? ?Medication list has been reviewed and updated. ? ?Current Outpatient Medications on File Prior to Visit  ?Medication Sig Dispense Refill  ? amLODipine (NORVASC) 5 MG tablet TAKE 1 TABLET(5 MG) BY MOUTH DAILY 90 tablet 3  ? blood glucose meter kit and supplies Dispense based on patient and insurance preference. Pt just needs meter 1 each 0  ? Blood Glucose Monitoring Suppl (ONE TOUCH ULTRA MINI) w/Device KIT Use to test blood sugar daily as instructed. Dx: E11.65 1 kit 0  ? cloNIDine (CATAPRES) 0.1 MG tablet Take 1 tablet (0.1 mg total) by mouth 2 (two) times daily. 180 tablet 3  ? clopidogrel (PLAVIX) 75 MG tablet Take 1 tablet (75 mg total) by mouth daily. 90 tablet 3  ? glipiZIDE (GLUCOTROL XL) 5 MG 24 hr tablet Take 2 tabs in the morning and 1 tab in the evening. 270 tablet 3  ? glucose blood (ONE TOUCH ULTRA TEST) test strip Test blood sugar 3 times a day. Dx code: 250.00 100 each 12  ? hydrochlorothiazide (HYDRODIURIL) 25 MG  tablet Take 1 tablet (25 mg total) by mouth daily. 90 tablet 3  ? insulin glargine (LANTUS SOLOSTAR) 100 UNIT/ML Solostar Pen ADMINISTER 20 UNITS UNDER THE SKIN DAILY 15 mL 3  ? Insulin Pen Needle (BD PEN NEEDLE NANO U/F) 32G X 4 MM MISC USE TO INJECT INSULIN ONCE DAILY 100 each 6  ? Insulin Syringe-Needle U-100 (INSULIN SYRINGE .5CC/31GX5/16") 31G X 5/16"  0.5 ML MISC Use to inject insulin 1 time daily. 90 each 3  ? Lancets MISC 1 each by Does not apply route 3 (three) times daily. Dx: E11.65 100 each 0  ? [DISCONTINUED] lisinopril (PRINIVIL,ZESTRIL) 20 MG tablet Take 20 mg by mouth daily.     ? ?No current facility-administered medications on file prior to visit.  ? ? ?Review of Systems: ? ?As per HPI- otherwise negative. ? ? ?Physical Examination: ?Vitals:  ? 05/04/21 1102  ?BP: 128/76  ?[name]: 75  ?Resp: 18  ?Temp: 97.6 ?F (36.4 ?C)  ?SpO2: 99%  ? ?Vitals:  ? 05/04/21 1102  ?Weight: 185 lb 3.2 oz (84 kg)  ?Height: 5' 1"  (1.549 m)  ? ?Body mass index is 34.99 kg/m?. ?Ideal Body Weight: Weight in (lb) to have BMI = 25: 132 ? ?GEN: no acute distress.  Obese, looks well.  Accompanied by her daughter ?HEENT: Atraumatic, Normocephalic.  ?Ears and Nose: No external deformity. ?CV: RRR, No M/G/R. No JVD. No thrill. No extra heart sounds. ?PULM: CTA B, no wheezes, crackles, rhonchi. No retractions. No resp. distress. No accessory muscle use. ?EXTR: No c/c/e ?PSYCH: Normally interactive. Conversant.  ? ?Wt Readings from Last 3 Encounters:  ?05/04/21 185 lb 3.2 oz (84 kg)  ?03/16/21 181 lb 3.2 oz (82.2 kg)  ?03/18/20 180 lb 3.2 oz (81.7 kg)  ? ? ?Assessment and Plan: ?Controlled type 2 diabetes mellitus with retinopathy of both eyes, with long-term current use of insulin, macular edema presence unspecified, unspecified retinopathy severity ([place]) - Plan: dapagliflozin propanediol (FARXIGA) 5 MG TABS tablet, Hemoglobin A1c ? ?Following up today- will try and add SGLT2 if she qualifies for assistance ? ?It was good to see you  today- it sounds like your glucose is getting better with more insulin  ?We will try to get [employer] approved for you- I do think this would benefit you.  I am glad to help with assistance application if needed ? ?P

## 2021-05-04 ENCOUNTER — Ambulatory Visit (INDEPENDENT_AMBULATORY_CARE_PROVIDER_SITE_OTHER): Payer: Medicare Other | Admitting: Family Medicine

## 2021-05-04 VITALS — BP 128/76 | HR 75 | Temp 97.6°F | Resp 18 | Ht 61.0 in | Wt 185.2 lb

## 2021-05-04 DIAGNOSIS — E11319 Type 2 diabetes mellitus with unspecified diabetic retinopathy without macular edema: Secondary | ICD-10-CM | POA: Diagnosis not present

## 2021-05-04 DIAGNOSIS — I6302 Cerebral infarction due to thrombosis of basilar artery: Secondary | ICD-10-CM | POA: Diagnosis not present

## 2021-05-04 DIAGNOSIS — Z794 Long term (current) use of insulin: Secondary | ICD-10-CM | POA: Diagnosis not present

## 2021-05-04 MED ORDER — DAPAGLIFLOZIN PROPANEDIOL 5 MG PO TABS: 5.0000 mg | ORAL_TABLET | Freq: Every day | ORAL | 3 refills | Status: DC

## 2021-05-04 NOTE — Patient Instructions (Signed)
It was good to see you today- it sounds like your glucose is getting better with more insulin  ?We will try to get [employer] approved for you- I do think this would benefit you.  I am glad to help with assistance application if needed ? ?Please be sure to get your eyes checked ?I recommend a pneumonia vaccine, shingles vaccine series, covid booster and tetanus booster ? ?Please plan to get your A1c checked in about 6 weeks ?Let me know if any questions about your blood sugar (too high or too low) coming up  ?

## 2021-05-31 ENCOUNTER — Encounter: Payer: Self-pay | Admitting: Family Medicine

## 2021-05-31 DIAGNOSIS — E11319 Type 2 diabetes mellitus with unspecified diabetic retinopathy without macular edema: Secondary | ICD-10-CM

## 2021-06-08 MED ORDER — ONETOUCH ULTRA VI STRP: ORAL_STRIP | 12 refills | Status: DC

## 2021-06-08 NOTE — Addendum Note (Signed)
Addended by: Lamar Blinks C on: 06/08/2021 09:11 AM   Modules accepted: Orders

## 2021-07-04 ENCOUNTER — Encounter: Payer: Self-pay | Admitting: Family Medicine

## 2021-07-04 DIAGNOSIS — I10 Essential (primary) hypertension: Secondary | ICD-10-CM

## 2021-07-05 MED ORDER — CLONIDINE HCL 0.1 MG PO TABS: 0.1000 mg | ORAL_TABLET | Freq: Two times a day (BID) | ORAL | 0 refills | Status: DC

## 2021-07-20 ENCOUNTER — Encounter: Payer: Self-pay | Admitting: Family Medicine

## 2021-07-20 MED ORDER — CLONIDINE HCL 0.1 MG PO TABS: 0.1000 mg | ORAL_TABLET | Freq: Two times a day (BID) | ORAL | 3 refills | Status: DC

## 2021-07-20 NOTE — Telephone Encounter (Signed)
Called her daughter [name] and spoke with her- I do think she should take Wanda [name] to the ER.  She will do so

## 2021-07-20 NOTE — Telephone Encounter (Signed)
Pt called back to see why pcp had called. Advised her of the message below. She stated she was fine and disconnected before I could see if a nurse was available.

## 2021-07-20 NOTE — Telephone Encounter (Signed)
Please advise- see other note on pt as well.

## 2021-07-24 ENCOUNTER — Inpatient Hospital Stay (HOSPITAL_COMMUNITY)
Admission: EM | Admit: 2021-07-24 | Discharge: 2021-07-29 | DRG: 064 | Disposition: A | Discharge status: Skilled Nursing Facility | Payer: Medicare Other | Attending: Internal Medicine | Admitting: Internal Medicine

## 2021-07-24 ENCOUNTER — Inpatient Hospital Stay (HOSPITAL_COMMUNITY): Payer: Medicare Other

## 2021-07-24 ENCOUNTER — Emergency Department (HOSPITAL_COMMUNITY): Payer: Medicare Other

## 2021-07-24 ENCOUNTER — Encounter (HOSPITAL_COMMUNITY): Payer: Self-pay | Admitting: Internal Medicine

## 2021-07-24 ENCOUNTER — Other Ambulatory Visit: Payer: Self-pay

## 2021-07-24 DIAGNOSIS — M1712 Unilateral primary osteoarthritis, left knee: Secondary | ICD-10-CM | POA: Diagnosis present

## 2021-07-24 DIAGNOSIS — Z96653 Presence of artificial knee joint, bilateral: Secondary | ICD-10-CM | POA: Diagnosis present

## 2021-07-24 DIAGNOSIS — I69398 Other sequelae of cerebral infarction: Secondary | ICD-10-CM | POA: Diagnosis not present

## 2021-07-24 DIAGNOSIS — I639 Cerebral infarction, unspecified: Secondary | ICD-10-CM | POA: Diagnosis present

## 2021-07-24 DIAGNOSIS — E1165 Type 2 diabetes mellitus with hyperglycemia: Secondary | ICD-10-CM | POA: Diagnosis present

## 2021-07-24 DIAGNOSIS — R531 Weakness: Secondary | ICD-10-CM | POA: Diagnosis not present

## 2021-07-24 DIAGNOSIS — I10 Essential (primary) hypertension: Secondary | ICD-10-CM | POA: Diagnosis present

## 2021-07-24 DIAGNOSIS — I6350 Cerebral infarction due to unspecified occlusion or stenosis of unspecified cerebral artery: Secondary | ICD-10-CM | POA: Diagnosis present

## 2021-07-24 DIAGNOSIS — G8194 Hemiplegia, unspecified affecting left nondominant side: Secondary | ICD-10-CM | POA: Diagnosis present

## 2021-07-24 DIAGNOSIS — Z1152 Encounter for screening for COVID-19: Secondary | ICD-10-CM | POA: Diagnosis not present

## 2021-07-24 DIAGNOSIS — Z888 Allergy status to other drugs, medicaments and biological substances status: Secondary | ICD-10-CM

## 2021-07-24 DIAGNOSIS — R739 Hyperglycemia, unspecified: Secondary | ICD-10-CM | POA: Diagnosis not present

## 2021-07-24 DIAGNOSIS — I6523 Occlusion and stenosis of bilateral carotid arteries: Secondary | ICD-10-CM | POA: Diagnosis not present

## 2021-07-24 DIAGNOSIS — E785 Hyperlipidemia, unspecified: Secondary | ICD-10-CM | POA: Diagnosis present

## 2021-07-24 DIAGNOSIS — Z91148 Patient's other noncompliance with medication regimen for other reason: Secondary | ICD-10-CM

## 2021-07-24 DIAGNOSIS — E042 Nontoxic multinodular goiter: Secondary | ICD-10-CM | POA: Diagnosis not present

## 2021-07-24 DIAGNOSIS — I6521 Occlusion and stenosis of right carotid artery: Secondary | ICD-10-CM | POA: Diagnosis not present

## 2021-07-24 DIAGNOSIS — I6503 Occlusion and stenosis of bilateral vertebral arteries: Secondary | ICD-10-CM | POA: Diagnosis present

## 2021-07-24 DIAGNOSIS — Z79899 Other long term (current) drug therapy: Secondary | ICD-10-CM

## 2021-07-24 DIAGNOSIS — Z7401 Bed confinement status: Secondary | ICD-10-CM | POA: Diagnosis not present

## 2021-07-24 DIAGNOSIS — E876 Hypokalemia: Secondary | ICD-10-CM | POA: Diagnosis not present

## 2021-07-24 DIAGNOSIS — N179 Acute kidney failure, unspecified: Secondary | ICD-10-CM | POA: Diagnosis not present

## 2021-07-24 DIAGNOSIS — G9341 Metabolic encephalopathy: Secondary | ICD-10-CM | POA: Diagnosis not present

## 2021-07-24 DIAGNOSIS — G459 Transient cerebral ischemic attack, unspecified: Secondary | ICD-10-CM | POA: Diagnosis not present

## 2021-07-24 DIAGNOSIS — H518 Other specified disorders of binocular movement: Secondary | ICD-10-CM | POA: Diagnosis present

## 2021-07-24 DIAGNOSIS — Z7902 Long term (current) use of antithrombotics/antiplatelets: Secondary | ICD-10-CM

## 2021-07-24 DIAGNOSIS — T45526A Underdosing of antithrombotic drugs, initial encounter: Secondary | ICD-10-CM | POA: Diagnosis not present

## 2021-07-24 DIAGNOSIS — R29713 NIHSS score 13: Secondary | ICD-10-CM | POA: Diagnosis present

## 2021-07-24 DIAGNOSIS — E1169 Type 2 diabetes mellitus with other specified complication: Secondary | ICD-10-CM | POA: Diagnosis present

## 2021-07-24 DIAGNOSIS — Z90710 Acquired absence of both cervix and uterus: Secondary | ICD-10-CM

## 2021-07-24 DIAGNOSIS — Z7984 Long term (current) use of oral hypoglycemic drugs: Secondary | ICD-10-CM

## 2021-07-24 DIAGNOSIS — R29810 Facial weakness: Secondary | ICD-10-CM | POA: Diagnosis not present

## 2021-07-24 DIAGNOSIS — Z886 Allergy status to analgesic agent status: Secondary | ICD-10-CM

## 2021-07-24 DIAGNOSIS — R200 Anesthesia of skin: Secondary | ICD-10-CM | POA: Diagnosis not present

## 2021-07-24 DIAGNOSIS — G936 Cerebral edema: Secondary | ICD-10-CM | POA: Diagnosis present

## 2021-07-24 DIAGNOSIS — Z85828 Personal history of other malignant neoplasm of skin: Secondary | ICD-10-CM

## 2021-07-24 DIAGNOSIS — Z8673 Personal history of transient ischemic attack (TIA), and cerebral infarction without residual deficits: Secondary | ICD-10-CM | POA: Diagnosis not present

## 2021-07-24 DIAGNOSIS — Z91128 Patient's intentional underdosing of medication regimen for other reason: Secondary | ICD-10-CM | POA: Diagnosis not present

## 2021-07-24 DIAGNOSIS — E669 Obesity, unspecified: Secondary | ICD-10-CM | POA: Diagnosis not present

## 2021-07-24 DIAGNOSIS — E119 Type 2 diabetes mellitus without complications: Secondary | ICD-10-CM | POA: Diagnosis present

## 2021-07-24 DIAGNOSIS — Z794 Long term (current) use of insulin: Secondary | ICD-10-CM

## 2021-07-24 DIAGNOSIS — Z803 Family history of malignant neoplasm of breast: Secondary | ICD-10-CM

## 2021-07-24 DIAGNOSIS — R509 Fever, unspecified: Secondary | ICD-10-CM | POA: Diagnosis not present

## 2021-07-24 DIAGNOSIS — W19XXXA Unspecified fall, initial encounter: Secondary | ICD-10-CM | POA: Diagnosis not present

## 2021-07-24 DIAGNOSIS — Z6833 Body mass index (BMI) 33.0-33.9, adult: Secondary | ICD-10-CM

## 2021-07-24 DIAGNOSIS — D72829 Elevated white blood cell count, unspecified: Secondary | ICD-10-CM | POA: Diagnosis not present

## 2021-07-24 DIAGNOSIS — I63231 Cerebral infarction due to unspecified occlusion or stenosis of right carotid arteries: Secondary | ICD-10-CM | POA: Diagnosis not present

## 2021-07-24 DIAGNOSIS — Z8249 Family history of ischemic heart disease and other diseases of the circulatory system: Secondary | ICD-10-CM

## 2021-07-24 DIAGNOSIS — G8190 Hemiplegia, unspecified affecting unspecified side: Secondary | ICD-10-CM | POA: Diagnosis not present

## 2021-07-24 DIAGNOSIS — I6389 Other cerebral infarction: Secondary | ICD-10-CM | POA: Diagnosis not present

## 2021-07-24 LAB — COMPREHENSIVE METABOLIC PANEL
ALT: 22 U/L (ref 0–44)
AST: 26 U/L (ref 15–41)
Albumin: 4.3 g/dL (ref 3.5–5.0)
Alkaline Phosphatase: 45 U/L (ref 38–126)
Anion gap: 18 — ABNORMAL HIGH (ref 5–15)
BUN: 23 mg/dL (ref 8–23)
CO2: 18 mmol/L — ABNORMAL LOW (ref 22–32)
Calcium: 10 mg/dL (ref 8.9–10.3)
Chloride: 101 mmol/L (ref 98–111)
Creatinine, Ser: 1.23 mg/dL — ABNORMAL HIGH (ref 0.44–1.00)
GFR, Estimated: 44 mL/min — ABNORMAL LOW (ref >60)
Glucose, Bld: 369 mg/dL — ABNORMAL HIGH (ref 70–99)
Potassium: 3.5 mmol/L (ref 3.5–5.1)
Sodium: 137 mmol/L (ref 135–145)
Total Bilirubin: 1.1 mg/dL (ref 0.3–1.2)
Total Protein: 7.8 g/dL (ref 6.5–8.1)

## 2021-07-24 LAB — URINALYSIS, ROUTINE W REFLEX MICROSCOPIC
Bacteria, UA: NONE SEEN
Bilirubin Urine: NEGATIVE
Glucose, UA: >=500 mg/dL — AB
Hgb urine dipstick: NEGATIVE
Ketones, ur: 80 mg/dL — AB
Leukocytes,Ua: NEGATIVE
Nitrite: NEGATIVE
Protein, ur: NEGATIVE mg/dL
Specific Gravity, Urine: >1.046 — ABNORMAL HIGH (ref 1.005–1.030)
pH: 5 (ref 5.0–8.0)

## 2021-07-24 LAB — CBC
HCT: 39.7 % (ref 36.0–46.0)
Hemoglobin: 14.4 g/dL (ref 12.0–15.0)
MCH: 30.8 pg (ref 26.0–34.0)
MCHC: 36.3 g/dL — ABNORMAL HIGH (ref 30.0–36.0)
MCV: 84.8 fL (ref 80.0–100.0)
Platelets: 269 10*3/uL (ref 150–400)
RBC: 4.68 MIL/uL (ref 3.87–5.11)
RDW: 12.3 % (ref 11.5–15.5)
WBC: 12.5 10*3/uL — ABNORMAL HIGH (ref 4.0–10.5)
nRBC: 0 % (ref 0.0–0.2)

## 2021-07-24 LAB — GLUCOSE, CAPILLARY
Glucose-Capillary: 398 mg/dL — ABNORMAL HIGH (ref 70–99)
Glucose-Capillary: 190 mg/dL — ABNORMAL HIGH (ref 70–99)

## 2021-07-24 LAB — DIFFERENTIAL
Abs Immature Granulocytes: 0.05 10*3/uL (ref 0.00–0.07)
Basophils Absolute: 0 10*3/uL (ref 0.0–0.1)
Basophils Relative: 0 %
Eosinophils Absolute: 0 10*3/uL (ref 0.0–0.5)
Eosinophils Relative: 0 %
Immature Granulocytes: 0 %
Lymphocytes Relative: 13 %
Lymphs Abs: 1.6 10*3/uL (ref 0.7–4.0)
Monocytes Absolute: 1 10*3/uL (ref 0.1–1.0)
Monocytes Relative: 8 %
Neutro Abs: 9.9 10*3/uL — ABNORMAL HIGH (ref 1.7–7.7)
Neutrophils Relative %: 79 %

## 2021-07-24 LAB — I-STAT CHEM 8, ED
BUN: 25 mg/dL — ABNORMAL HIGH (ref 8–23)
Calcium, Ion: 1.15 mmol/L (ref 1.15–1.40)
Chloride: 102 mmol/L (ref 98–111)
Creatinine, Ser: 0.9 mg/dL (ref 0.44–1.00)
Glucose, Bld: 384 mg/dL — ABNORMAL HIGH (ref 70–99)
HCT: 41 % (ref 36.0–46.0)
Hemoglobin: 13.9 g/dL (ref 12.0–15.0)
Potassium: 3.4 mmol/L — ABNORMAL LOW (ref 3.5–5.1)
Sodium: 137 mmol/L (ref 135–145)
TCO2: 18 mmol/L — ABNORMAL LOW (ref 22–32)

## 2021-07-24 LAB — APTT: aPTT: 22 seconds — ABNORMAL LOW (ref 24–36)

## 2021-07-24 LAB — CBG MONITORING, ED
Glucose-Capillary: 361 mg/dL — ABNORMAL HIGH (ref 70–99)
Glucose-Capillary: 402 mg/dL — ABNORMAL HIGH (ref 70–99)

## 2021-07-24 LAB — PROTIME-INR
INR: 1 (ref 0.8–1.2)
Prothrombin Time: 13.4 seconds (ref 11.4–15.2)

## 2021-07-24 LAB — ETHANOL: Alcohol, Ethyl (B): <10 mg/dL (ref <10)

## 2021-07-24 MED ORDER — ACETAMINOPHEN 325 MG PO TABS: 650.0000 mg | ORAL_TABLET | Freq: Four times a day (QID) | ORAL | Status: DC | PRN

## 2021-07-24 MED ORDER — CLOPIDOGREL BISULFATE 75 MG PO TABS
75.0000 mg | ORAL_TABLET | Freq: Every day | ORAL | Status: DC
  Administered 2021-07-24 – 2021-07-29 (×5): 75 mg via ORAL
  Filled 2021-07-24 (×6): qty 1

## 2021-07-24 MED ORDER — SODIUM CHLORIDE 0.9% FLUSH
3.0000 mL | Freq: Once | INTRAVENOUS | Status: AC
  Administered 2021-07-24: 3 mL via INTRAVENOUS

## 2021-07-24 MED ORDER — INSULIN ASPART 100 UNIT/ML IJ SOLN
0.0000 [IU] | Freq: Three times a day (TID) | INTRAMUSCULAR | Status: DC
  Administered 2021-07-24: 20 [IU] via SUBCUTANEOUS
  Administered 2021-07-25 (×2): 11 [IU] via SUBCUTANEOUS
  Administered 2021-07-25: 15 [IU] via SUBCUTANEOUS
  Administered 2021-07-26: 11 [IU] via SUBCUTANEOUS
  Administered 2021-07-26 (×2): 7 [IU] via SUBCUTANEOUS
  Administered 2021-07-27: 11 [IU] via SUBCUTANEOUS
  Administered 2021-07-27: 3 [IU] via SUBCUTANEOUS
  Administered 2021-07-28 (×2): 20 [IU] via SUBCUTANEOUS
  Administered 2021-07-28: 5 [IU] via SUBCUTANEOUS
  Administered 2021-07-29: 4 [IU] via SUBCUTANEOUS
  Administered 2021-07-29: 7 [IU] via SUBCUTANEOUS
  Administered 2021-07-29: 11 [IU] via SUBCUTANEOUS

## 2021-07-24 MED ORDER — INSULIN ASPART 100 UNIT/ML IJ SOLN
0.0000 [IU] | Freq: Every day | INTRAMUSCULAR | Status: DC
  Administered 2021-07-25: 5 [IU] via SUBCUTANEOUS
  Administered 2021-07-26 – 2021-07-28 (×3): 2 [IU] via SUBCUTANEOUS

## 2021-07-24 MED ORDER — ACETAMINOPHEN 325 MG PO TABS: 650.0000 mg | ORAL_TABLET | ORAL | Status: DC | PRN

## 2021-07-24 MED ORDER — SODIUM CHLORIDE 0.9 % IV SOLN: INTRAVENOUS | Status: DC

## 2021-07-24 MED ORDER — INSULIN ASPART 100 UNIT/ML IJ SOLN: 0.0000 [IU] | Freq: Three times a day (TID) | INTRAMUSCULAR | Status: DC

## 2021-07-24 MED ORDER — SENNOSIDES-DOCUSATE SODIUM 8.6-50 MG PO TABS: 1.0000 | ORAL_TABLET | Freq: Every evening | ORAL | Status: DC | PRN

## 2021-07-24 MED ORDER — HEPARIN SODIUM (PORCINE) 5000 UNIT/ML IJ SOLN
5000.0000 [IU] | Freq: Three times a day (TID) | INTRAMUSCULAR | Status: DC
  Administered 2021-07-24 – 2021-07-28 (×11): 5000 [IU] via SUBCUTANEOUS
  Filled 2021-07-24 (×10): qty 1

## 2021-07-24 MED ORDER — ACETAMINOPHEN 160 MG/5ML PO SOLN: 650.0000 mg | ORAL | Status: DC | PRN

## 2021-07-24 MED ORDER — IOHEXOL 350 MG/ML SOLN
100.0000 mL | Freq: Once | INTRAVENOUS | Status: AC | PRN
  Administered 2021-07-24: 100 mL via INTRAVENOUS

## 2021-07-24 MED ORDER — LORAZEPAM 0.5 MG PO TABS
0.5000 mg | ORAL_TABLET | Freq: Once | ORAL | Status: AC | PRN
  Administered 2021-07-25: 0.5 mg via ORAL
  Filled 2021-07-24: qty 1

## 2021-07-24 MED ORDER — ACETAMINOPHEN 650 MG RE SUPP: 650.0000 mg | RECTAL | Status: DC | PRN

## 2021-07-24 MED ORDER — STROKE: EARLY STAGES OF RECOVERY BOOK
Freq: Once | Status: AC
  Filled 2021-07-24: qty 1

## 2021-07-24 NOTE — ED Triage Notes (Signed)
Arrived via [facility] [place] 07/23/21 at 1700. Left side weakness, left facial droop, and right gaze.

## 2021-07-24 NOTE — Code Documentation (Signed)
Stroke Response Nurse Documentation Code Documentation  Sheniece Loxley Cibrian is a 81 y.o. female arriving to [facility]  via [facility] on 07/24/2021 with past medical hx of HTN, DM, CVA, stenosis. On clopidogrel 75 mg daily. Code stroke was activated by EMS.   Patient from home where she was LKW at [facility] 07/23/2021 and now complaining of Right gaze and left weakness. Per EMS he daughter saw her yesterday at [facility].  This morning when she returned she found her mom on the floor.   Stroke team at the bedside on patient arrival. Labs drawn and patient cleared for CT by Dr. Jeanell Sparrow. Patient to CT with team. NIHSS 12, see documentation for details and code stroke times. Patient with right gaze preference , left hemianopia, left facial droop, left arm weakness, bilateral leg weakness, left decreased sensation, dysarthria , and Sensory  neglect on exam. The following imaging was completed:  CT Head, CTA, and CTP. Patient is not a candidate for IV Thrombolytic due to being outside the window. Patient is not a candidate for IR due to no LVO on CTA.   Care Plan: mNIHSS and VS q 2 hours.   Bedside handoff with ED RN Marya Amsler.    Raliegh Ip  Stroke Response RN

## 2021-07-24 NOTE — H&P (Signed)
History and Physical    Wanda [name] VOJ:[id number] DOB: 05/22/1940 DOA: 07/24/2021  PCP: Darreld Mclean, MD  Patient coming from: Home  Chief Complaint: Fall  HPI: Wanda [name] is a 81 y.o. female with medical history significant of HTN, DM2, h/o CVA s/p left endarterectomy of the carotid, arthritis who presented after being found down.  Her daughter is present and adds to the history.  Patient was noted to be well at 5pm on Saturday the day PTA.  Next seen the following morning down and with left sided weakness.  The patient reports attempting to get up in the morning and falling against her dresser and then being found by her daughter.  She does not know if she felt weak at that time.  Daughter reports that patient has been forgetting to take her medications at time.  She is attempting to help her mother.  About 2 weeks ago, the patient decided to stop taking her plavix because she was not sure what it was doing for her.  She also has not been able to tolerate statins in the past.  She has a FH of CAD on her father's side.  She denies urinary symptoms, blood loss, chest pain or cough.   ED Course: In the ED, code stroke was called.  CTA of the head and neck showed cytotoxic edema in the posterior right MCA territory, suggestion of high grade right ICA stenosis, atherosclerotic and highly stenotic bilateral vertebral arteries.  Aortic atherosclerosis.  Neurology saw the patient (note pending).  Labwork revealed a bicarb of 18, Cr of 1.23, AG of 18, WBC of 12.5 with a slight neutrophil predominance.  Blood sugars in the 300s, last A1C around 12.   Review of Systems: As per HPI otherwise all other systems reviewed and are negative.  Past Medical History:  Diagnosis Date   Arthritis    Carotid stenosis 11/2018   Cataract    surgery to remove   Diabetes mellitus    type 2   Hyperlipidemia    Hypertension    Skin cancer    Removed from face   Stroke [facility]) 09/19/2018    Urgency of urination    Urinary leakage     Past Surgical History:  Procedure Laterality Date   ABDOMINAL HYSTERECTOMY  early 80's   total   CAROTID ENDARTERECTOMY Left 11/13/2018   CARPAL TUNNEL RELEASE Bilateral 1980 and 1981   both hands   ENDARTERECTOMY Left 11/13/2018   Procedure: Left Carotid Artery Endarterectomy with Patch Angioplasty;  Surgeon: Rosetta Posner, MD;  Location: [facility] OR;  Service: Vascular;  Laterality: Left;   EYE SURGERY     bilateral cataracts   Fatty Tumor Excision     JOINT REPLACEMENT     TONSILLECTOMY     TONSILLECTOMY AND ADENOIDECTOMY  age 30   TOTAL KNEE ARTHROPLASTY Right 10/04/2015   TOTAL KNEE ARTHROPLASTY Right 10/04/2015   Procedure: TOTAL KNEE ARTHROPLASTY;  Surgeon: Dorna Leitz, MD;  Location: [place];  Service: Orthopedics;  Laterality: Right;   TOTAL KNEE ARTHROPLASTY Left 01/28/2016   Procedure: TOTAL KNEE ARTHROPLASTY;  Surgeon: Dorna Leitz, MD;  Location: [place];  Service: Orthopedics;  Laterality: Left;   TUBAL LIGATION      Social History  reports that she has never smoked. She has never used smokeless tobacco. She reports current alcohol use of about 1.0 standard drink of alcohol per week. She reports that she does not use drugs.  Allergies  Allergen Reactions  Ace Inhibitors Diarrhea, Swelling, Other (See Comments) and Cough    Pt had cough and diarrhea with first few doses of medication; also had swelling of right eyelid. Stopped medication on 03/17/11.   Alendronate Sodium Other (See Comments)    "Made my whole body hurt"   Aspirin Hives   Crestor [Rosuvastatin] Other (See Comments)    "Made my whole body hurt"   Lipitor [Atorvastatin Calcium] Other (See Comments)    "Made my whole body hurt"   Metformin And Related     Severe abdominal pain   Shellfish Allergy Nausea Only    Intolerant of fresh shellfish, reports the reaction is GI upset, denies hives, denies any swelling  Reports that she can tolerate canned seafood.     Family  History  Problem Relation Age of Onset   Cancer Mother        liver   Breast cancer Mother 47   Pancreatitis Father        deceased 96   Cancer Brother        GI   Coronary artery disease Paternal 61    Cancer Son        terminal kidney   Breast cancer Maternal Aunt    Cancer Other    Coronary artery disease Paternal Uncle    Prior to Admission medications   Medication Sig Start Date End Date Taking? Authorizing Provider  amLODipine (NORVASC) 5 MG tablet TAKE 1 TABLET(5 MG) BY MOUTH DAILY 03/16/21   Copland, Gay Filler, MD  blood glucose meter kit and supplies Dispense based on patient and insurance preference. Pt just needs meter 02/26/17   Copland, Gay Filler, MD  Blood Glucose Monitoring Suppl (ONE TOUCH ULTRA MINI) w/Device KIT Use to test blood sugar daily as instructed. Dx: E11.65 01/26/21   Copland, Gay Filler, MD  cloNIDine (CATAPRES) 0.1 MG tablet Take 1 tablet (0.1 mg total) by mouth 2 (two) times daily. 07/20/21   Copland, Gay Filler, MD  clopidogrel (PLAVIX) 75 MG tablet Take 1 tablet (75 mg total) by mouth daily. 03/16/21   Copland, Gay Filler, MD  glipiZIDE (GLUCOTROL XL) 5 MG 24 hr tablet Take 2 tabs in the morning and 1 tab in the evening. 03/16/21   Copland, Gay Filler, MD  glucose blood (ONE TOUCH ULTRA TEST) test strip Test blood sugar 3 times a day. Dx code: 250.00 03/16/21   Copland, Gay Filler, MD  glucose blood (ONETOUCH ULTRA) test strip Use as instructed 06/08/21   Copland, Gay Filler, MD  hydrochlorothiazide (HYDRODIURIL) 25 MG tablet Take 1 tablet (25 mg total) by mouth daily. 03/16/21   Copland, Gay Filler, MD  insulin glargine (LANTUS SOLOSTAR) 100 UNIT/ML Solostar Pen ADMINISTER 20 UNITS UNDER THE SKIN DAILY 03/16/21   Copland, Gay Filler, MD  Insulin Pen Needle (BD PEN NEEDLE NANO U/F) 32G X 4 MM MISC USE TO INJECT INSULIN ONCE DAILY 08/31/16   Copland, Gay Filler, MD  Insulin Syringe-Needle U-100 (INSULIN SYRINGE .5CC/31GX5/16") 31G X 5/16" 0.5 ML MISC Use to inject insulin  1 time daily. 09/23/14   Philemon Kingdom, MD  Lancets MISC 1 each by Does not apply route 3 (three) times daily. Dx: E11.65 01/26/21   Copland, Gay Filler, MD  lisinopril (PRINIVIL,ZESTRIL) 20 MG tablet Take 20 mg by mouth daily.   03/23/11  [provider]    Physical Exam: Vitals:   07/24/21 1132 07/24/21 1159 07/24/21 1200 07/24/21 1215  BP: 132/74 121/76 (!) 156/73 (!) 149/70  Pulse: 95  100 99 (!) 109  Resp:  20 (!) 23 (!) 27  Temp:  98.1 F (36.7 C)    SpO2: 100% 99% 100% 100%  Weight: 80.6 kg       Constitutional: Elderly woman, lying in bed, feels unwell Eyes: PERRL, lids and conjunctivae normal, rightward gaze preference ENMT: Mucous membranes are dry Neck: normal, supple Respiratory: Breathing comfortably on room air, no wheezing Cardiovascular: Mildly tachycardic, regular, no murmur, no pedal edema Abdomen: no tenderness, no masses palpated. +BS Musculoskeletal: no clubbing / cyanosis. Normal bulk for age Skin: no rashes, lesions, ulcers on exposed skin, she has a well healed endarterectomy scar on the left neck.  Neurologic: Rightward gaze preference, tongue midline, naming is normal, no dysarthria, she has 3/5 strength on the left hand, unable to hold arm against gravity, 4/5 strength in the hip flexor, 3/5 strength in ankle flexor and extensor Psychiatric: Very nervous, fidgety, reports this is normal for her.    Labs on Admission: I have personally reviewed following labs and imaging studies  CBC: Recent Labs  Lab 07/24/21 1122 07/24/21 1126  WBC 12.5*  --   NEUTROABS 9.9*  --   HGB 14.4 13.9  HCT 39.7 41.0  MCV 84.8  --   PLT 269  --     Basic Metabolic Panel: Recent Labs  Lab 07/24/21 1122 07/24/21 1126  NA 137 137  K 3.5 3.4*  CL 101 102  CO2 18*  --   GLUCOSE 369* 384*  BUN 23 25*  CREATININE 1.23* 0.90  CALCIUM 10.0  --     GFR: Estimated Creatinine Clearance: 47.1 mL/min (by C-G formula based on SCr of 0.9 mg/dL).  Liver  Function Tests: Recent Labs  Lab 07/24/21 1122  AST 26  ALT 22  ALKPHOS 45  BILITOT 1.1  PROT 7.8  ALBUMIN 4.3    Urine analysis: pending   Radiological Exams on Admission: CT ANGIO HEAD NECK W WO CM W PERF (CODE STROKE)  Result Date: 07/24/2021 CLINICAL DATA:  80 year old female code stroke. EXAM: CT ANGIOGRAPHY HEAD AND NECK CT PERFUSION BRAIN TECHNIQUE: Multidetector CT imaging of the head and neck was performed using the standard protocol during bolus administration of intravenous contrast. Multiplanar CT image reconstructions and MIPs were obtained to evaluate the vascular anatomy. Carotid stenosis measurements (when applicable) are obtained utilizing NASCET criteria, using the distal internal carotid diameter as the denominator. Multiphase CT imaging of the brain was performed following IV bolus contrast injection. Subsequent parametric perfusion maps were calculated using RAPID software. RADIATION DOSE REDUCTION: This exam was performed according to the departmental dose-optimization program which includes automated exposure control, adjustment of the mA and/or kV according to patient size and/or use of iterative reconstruction technique. CONTRAST:  172m OMNIPAQUE IOHEXOL 350 MG/ML SOLN COMPARISON:  Brain MRI 09/19/2018.  Head CT 09/20/2018. FINDINGS: CT HEAD FINDINGS Brain: Cerebral volume not significantly changed since 2020. No midline shift, mass effect, or evidence of intracranial mass lesion. No ventriculomegaly. Patchy chronic bilateral cerebral white matter hypodensity. Cytotoxic edema right parietal lobe series 4, image 26. Subtle associated increased hypodensity in the right centrum semiovale white matter. ASPECTS 8. Other right MCA territory gray-white matter differentiation appears stable, maintained. No acute intracranial hemorrhage identified. Vascular: Calcified atherosclerosis at the skull base. No suspicious intracranial vascular hyperdensity. Skull: No acute osseous  abnormality identified. Sinuses/Orbits: Visualized paranasal sinuses and mastoids are stable and well aerated. Other: Rightward gaze. Visualized scalp soft tissues are within normal limits. Preliminary results were communicated  to Dr. Rory Percy at 11:38 am on 07/24/2021 by text page via the [facility] messaging system. CT Brain Perfusion Findings: ASPECTS: 8 CBF (<30%) Volume: 0 mL. No CBF or CBV parameter abnormalities detected. Perfusion (Tmax>6.0s) volume: 11 mL, some corresponding to the abnormal ASPECTS and some also in the right temporal lobe Mismatch Volume: Less than 11 mL Infarction Location:Right MCA territory is implicated CTA NECK Skeleton: No acute osseous abnormality identified. Age-appropriate cervical spine degeneration. Upper chest: Negative upper lungs and visible mediastinum. Other neck: Heterogeneously enlarged thyroid In the setting of significant comorbidities or limited life expectancy, no follow-up recommended (ref: J Am Coll Radiol. 2015 Feb;12(2): 143-50).Motion artifact. Aortic arch: Calcified aortic atherosclerosis. 3 vessel arch configuration. Right carotid system: Brachiocephalic artery and proximal right CCA plaque with no significant stenosis. Artifact obscures detail of the right carotid bifurcation where at least moderate atherosclerosis is present. At the level of the right ICA bulb high-grade stenosis is suspected approaching a radiographic string sign series 16, image 193. But the right ICA remains patent to the skull base. Left carotid system: Left CCA origin plaque and additional soft and calcified plaque before the bifurcation without stenosis. Motion artifact but better visualization of the proximal left ICA. No hemodynamically significant stenosis. Evidence of previous left carotid endarterectomy. Vertebral arteries: Tortuosity and atherosclerosis at the right subclavian artery origin with 60 % stenosis with respect to the distal vessel. Calcified plaque at the right vertebral artery  origin with severe stenosis (series 17, image 89). Non dominant right vertebral artery with abundant additional atherosclerosis to the skull base, but the vessel remains patent despite tandem stenoses. Proximal left subclavian atherosclerosis with less than 50 % stenosis with respect to the distal vessel. Calcified plaque at the left vertebral artery origin with up to moderate V1 segment stenosis. Dominant left vertebral artery with abundant additional calcified plaque in the neck, at least moderate P2 stenosis series 16, image 206, and moderate to severe V2 stenosis image 194. But the vessel remains patent to the skull base. CTA HEAD Posterior circulation: Diminutive and stenotic right vertebral V4 segment remains patent to the PICA origin, and possibly the vertebrobasilar junction. Dominant left vertebral V4 segment with mild to moderate stenosis at the cisterna magna, but remains patent and supplies the basilar. Left PICA origin is patent. Patent basilar artery without stenosis. Patent basilar tip, SCA and PCA origins. Mild bilateral PCA origin stenosis. Mild to moderate stenosis left PCA P2 segment. Other bilateral PCA branches are within normal limits. Anterior circulation: Both ICA siphons are patent. Left siphon calcified plaque with mild to moderate supraclinoid stenosis. Right siphon calcified plaque with only mild stenosis. Posterior communicating arteries are diminutive or absent. Patent carotid termini. Patent MCA and ACA origins. Dominant right and diminutive or absent left ACA A1 segments. Normal anterior communicating artery. Bilateral ACA branches are within normal limits. Left MCA M1 segment and bifurcation are patent without stenosis. Left MCA branches are within normal limits. Right MCA M1 segment and bifurcation are patent without stenosis. No right MCA branch occlusion or high-grade stenosis is identified. Venous sinuses: Early contrast timing, not evaluated. Anatomic variants: Dominant left and  diminutive right vertebral arteries, dominant right and diminutive or absent left A1 segments. Review of the MIP images confirms the above findings IMPRESSION: 1. Cytotoxic edema in the posterior Right MCA territory (ASPECTS 8) with no hemorrhage or mass effect. CTP does not detect infarct core, suggesting it might be subacute. And only small volume asymmetric T-max abnormality in the right hemisphere.  2. No right MCA branch occlusion identified. Motion artifact in the neck but suspicion of high-grade right ICA origin stenosis, probably RADIOGRAPHIC STRING SIGN. Right siphon calcified plaque but only mild stenosis. 3. Evidence of previous Left Carotid Endarterectomy. Mild to moderate left siphon stenosis. 4. Atherosclerotic and highly stenotic bilateral vertebral arteries, the left is dominant. Mild bilateral PCA origin and moderate Left PCA P2 stenoses. 5. Aortic Atherosclerosis (ICD10-I70.0). Salient findings discussed by telephone with Dr. Rory Percy on 07/24/2021 at 12:06 . Electronically Signed   By: Genevie Ann M.D.   On: 07/24/2021 12:08    EKG: pending  Assessment/Plan  Acute stroke due to ischemia - Admit to telemetry - Follow neurology recommendations - MRI brain - TTE - Lipid panel and A1C ordered - Hold antihypertensives, allow permissive HTN for now - Check EKG and troponin given patient's anxiety, unclear if this is a manifestation of current illness? - Restart plavix, allergy to aspirin - discussed with Neurology team, plavix only for now - Unable to tolerate statins in the past, ? PCSK9 as an option - PT/OT/SLP (if needed after swallow screen)  Essential hypertension - BP mildly elevated at this time, allow permissive HTN - Hold amlodipine, clonidine, hctz    Hyperlipidemia with target LDL less than 70 - Last LDL was 170 at PCP appointment - She is intolerant of statins - Recheck lipid profile - Discuss with neurology re: options    Diabetes mellitus Elevated AG - She has not been  taking her medications regularly per family - Hold glipizide and lantus currently  - SSI - Trend AG - Check urinalysis, if + ketones, will consider insulin drip  Leukocytosis - Check UA - I suspect this is in the setting of acute stroke    Multinodular goiter (nontoxic) - Check TSH - Last TSH in March was WNL    Primary osteoarthritis of left knee - Pain control with tylenol PRN  DVT prophylaxis: Heparin Code Status:   Full, discussed with patient and family at bedside  Family Communication:  Daughter at bedside  Disposition Plan:   Patient is from:  Home  Anticipated DC to:  SNF  Anticipated DC date:  7/20  Anticipated DC barriers: Stroke work up, need for rehab  Consults called:  Neurology, Rory Percy  Admission status:  IP, med telemetry   Severity of Illness: The appropriate patient status for this patient is INPATIENT. Inpatient status is judged to be reasonable and necessary in order to provide the required intensity of service to ensure the patient's safety. The patient's presenting symptoms, physical exam findings, and initial radiographic and laboratory data in the context of their chronic comorbidities is felt to place them at high risk for further clinical deterioration. Furthermore, it is not anticipated that the patient will be medically stable for discharge from the hospital within 2 midnights of admission.   * I certify that at the point of admission it is my clinical judgment that the patient will require inpatient hospital care spanning beyond 2 midnights from the point of admission due to high intensity of service, high risk for further deterioration and high frequency of surveillance required.Gilles Chiquito MD [facility]  How to contact the [facility] Attending or Consulting provider [place] or covering provider during after hours [place], for this patient?   Check the care team in [facility] and look for a) attending/consulting [facility] provider listed and b) the [facility]  team listed Log into [email or web address] and use [place]'s  universal password to access. If you do not have the password, please contact the hospital operator. Locate the [facility] provider you are looking for under [facility] and page to a number that you can be directly reached. If you still have difficulty reaching the provider, please page the [facility] (Director on Call) for the Hospitalists listed on amion for assistance.  07/24/2021, 1:15 PM

## 2021-07-24 NOTE — ED Provider Notes (Signed)
[place] EMERGENCY DEPARTMENT Provider Note   CSN: [id number] Arrival date & time: 07/24/21  1116  An emergency department physician performed an initial assessment on this suspected stroke patient at 1117.  History  Chief Complaint  Patient presents with   Code Stroke    Wanda [name] is a 81 y.o. female present emerged department as a code stroke with left-sided weakness.  Patient was last seen well yesterday evening at 5 PM by her daughter.  She was found down on the ground today.  She was having difficulty moving her left side and reporting numbness in her left arm and leg in the left half of her face.  HPI     Home Medications Prior to Admission medications   Medication Sig Start Date End Date Taking? Authorizing Provider  amLODipine (NORVASC) 5 MG tablet TAKE 1 TABLET(5 MG) BY MOUTH DAILY Patient taking differently: Take 5 mg by mouth daily. 03/16/21  Yes Copland, Gay Filler, MD  cloNIDine (CATAPRES) 0.1 MG tablet Take 1 tablet (0.1 mg total) by mouth 2 (two) times daily. 07/20/21  Yes Copland, Gay Filler, MD  clopidogrel (PLAVIX) 75 MG tablet Take 1 tablet (75 mg total) by mouth daily. 03/16/21  Yes Copland, Gay Filler, MD  glipiZIDE (GLUCOTROL XL) 5 MG 24 hr tablet Take 2 tabs in the morning and 1 tab in the evening. Patient taking differently: Take 5-10 mg by mouth See admin instructions. Take 2 tabs in the morning and 1 tab in the evening. 03/16/21  Yes Copland, Gay Filler, MD  hydrochlorothiazide (HYDRODIURIL) 25 MG tablet Take 1 tablet (25 mg total) by mouth daily. 03/16/21  Yes Copland, Gay Filler, MD  insulin glargine (LANTUS SOLOSTAR) 100 UNIT/ML Solostar Pen ADMINISTER 20 UNITS UNDER THE SKIN DAILY Patient taking differently: Inject 35 Units into the skin daily. 03/16/21  Yes Copland, Gay Filler, MD  blood glucose meter kit and supplies Dispense based on patient and insurance preference. Pt just needs meter 02/26/17   Copland, Gay Filler, MD  Blood Glucose  Monitoring Suppl (ONE TOUCH ULTRA MINI) w/Device KIT Use to test blood sugar daily as instructed. Dx: E11.65 01/26/21   Copland, Gay Filler, MD  glucose blood (ONE TOUCH ULTRA TEST) test strip Test blood sugar 3 times a day. Dx code: 250.00 03/16/21   Copland, Gay Filler, MD  glucose blood (ONETOUCH ULTRA) test strip Use as instructed 06/08/21   Copland, Gay Filler, MD  Insulin Pen Needle (BD PEN NEEDLE NANO U/F) 32G X 4 MM MISC USE TO INJECT INSULIN ONCE DAILY 08/31/16   Copland, Gay Filler, MD  Insulin Syringe-Needle U-100 (INSULIN SYRINGE .5CC/31GX5/16") 31G X 5/16" 0.5 ML MISC Use to inject insulin 1 time daily. 09/23/14   Philemon Kingdom, MD  Lancets MISC 1 each by Does not apply route 3 (three) times daily. Dx: E11.65 01/26/21   Copland, Gay Filler, MD  lisinopril (PRINIVIL,ZESTRIL) 20 MG tablet Take 20 mg by mouth daily.   03/23/11  [provider]      Allergies    Ace inhibitors, Alendronate sodium, Aspirin, Crestor [rosuvastatin], Lipitor [atorvastatin calcium], Metformin and related, and Shellfish allergy    Review of Systems   Review of Systems  Physical Exam Updated Vital Signs BP (!) 138/59   Pulse 86   Temp 98.1 F (36.7 C)   Resp 13   Wt 80.6 kg   LMP  (LMP Unknown)   SpO2 99%   BMI 33.57 kg/m  Physical Exam Constitutional:  General: She is not in acute distress. HENT:     Head: Normocephalic and atraumatic.  Eyes:     Conjunctiva/sclera: Conjunctivae normal.     Pupils: Pupils are equal, round, and reactive to light.  Cardiovascular:     Rate and Rhythm: Normal rate and regular rhythm.  Pulmonary:     Effort: Pulmonary effort is normal. No respiratory distress.  Abdominal:     General: There is no distension.     Tenderness: There is no abdominal tenderness.  Skin:    General: Skin is warm and dry.  Neurological:     Mental Status: She is alert.     Comments: Weakness and paresthesia left face, left arm and leg     ED Results / Procedures /  Treatments   Labs (all labs ordered are listed, but only abnormal results are displayed) Labs Reviewed  APTT - Abnormal; Notable for the following components:      Result Value   aPTT 22 (*)    All other components within normal limits  CBC - Abnormal; Notable for the following components:   WBC 12.5 (*)    MCHC 36.3 (*)    All other components within normal limits  DIFFERENTIAL - Abnormal; Notable for the following components:   Neutro Abs 9.9 (*)    All other components within normal limits  COMPREHENSIVE METABOLIC PANEL - Abnormal; Notable for the following components:   CO2 18 (*)    Glucose, Bld 369 (*)    Creatinine, Ser 1.23 (*)    GFR, Estimated 44 (*)    Anion gap 18 (*)    All other components within normal limits  URINALYSIS, ROUTINE W REFLEX MICROSCOPIC - Abnormal; Notable for the following components:   Specific Gravity, Urine >1.046 (*)    Glucose, UA >=500 (*)    Ketones, ur 80 (*)    All other components within normal limits  I-STAT CHEM 8, ED - Abnormal; Notable for the following components:   Potassium 3.4 (*)    BUN 25 (*)    Glucose, Bld 384 (*)    TCO2 18 (*)    All other components within normal limits  CBG MONITORING, ED - Abnormal; Notable for the following components:   Glucose-Capillary 402 (*)    All other components within normal limits  CBG MONITORING, ED - Abnormal; Notable for the following components:   Glucose-Capillary 361 (*)    All other components within normal limits  PROTIME-INR  ETHANOL  TROPONIN I (HIGH SENSITIVITY)  TROPONIN I (HIGH SENSITIVITY)    EKG None  Radiology CT ANGIO HEAD NECK W WO CM W PERF (CODE STROKE)  Result Date: 07/24/2021 CLINICAL DATA:  81 year old female code stroke. EXAM: CT ANGIOGRAPHY HEAD AND NECK CT PERFUSION BRAIN TECHNIQUE: Multidetector CT imaging of the head and neck was performed using the standard protocol during bolus administration of intravenous contrast. Multiplanar CT image reconstructions  and MIPs were obtained to evaluate the vascular anatomy. Carotid stenosis measurements (when applicable) are obtained utilizing NASCET criteria, using the distal internal carotid diameter as the denominator. Multiphase CT imaging of the brain was performed following IV bolus contrast injection. Subsequent parametric perfusion maps were calculated using RAPID software. RADIATION DOSE REDUCTION: This exam was performed according to the departmental dose-optimization program which includes automated exposure control, adjustment of the mA and/or kV according to patient size and/or use of iterative reconstruction technique. CONTRAST:  1105m OMNIPAQUE IOHEXOL 350 MG/ML SOLN COMPARISON:  Brain MRI 09/19/2018.  Head CT 09/20/2018. FINDINGS: CT HEAD FINDINGS Brain: Cerebral volume not significantly changed since 2020. No midline shift, mass effect, or evidence of intracranial mass lesion. No ventriculomegaly. Patchy chronic bilateral cerebral white matter hypodensity. Cytotoxic edema right parietal lobe series 4, image 26. Subtle associated increased hypodensity in the right centrum semiovale white matter. ASPECTS 8. Other right MCA territory gray-white matter differentiation appears stable, maintained. No acute intracranial hemorrhage identified. Vascular: Calcified atherosclerosis at the skull base. No suspicious intracranial vascular hyperdensity. Skull: No acute osseous abnormality identified. Sinuses/Orbits: Visualized paranasal sinuses and mastoids are stable and well aerated. Other: Rightward gaze. Visualized scalp soft tissues are within normal limits. Preliminary results were communicated to Dr. Rory Percy at 11:38 am on 07/24/2021 by text page via the [facility] messaging system. CT Brain Perfusion Findings: ASPECTS: 8 CBF (<30%) Volume: 0 mL. No CBF or CBV parameter abnormalities detected. Perfusion (Tmax>6.0s) volume: 11 mL, some corresponding to the abnormal ASPECTS and some also in the right temporal lobe Mismatch Volume:  Less than 11 mL Infarction Location:Right MCA territory is implicated CTA NECK Skeleton: No acute osseous abnormality identified. Age-appropriate cervical spine degeneration. Upper chest: Negative upper lungs and visible mediastinum. Other neck: Heterogeneously enlarged thyroid In the setting of significant comorbidities or limited life expectancy, no follow-up recommended (ref: J Am Coll Radiol. 2015 Feb;12(2): 143-50).Motion artifact. Aortic arch: Calcified aortic atherosclerosis. 3 vessel arch configuration. Right carotid system: Brachiocephalic artery and proximal right CCA plaque with no significant stenosis. Artifact obscures detail of the right carotid bifurcation where at least moderate atherosclerosis is present. At the level of the right ICA bulb high-grade stenosis is suspected approaching a radiographic string sign series 16, image 193. But the right ICA remains patent to the skull base. Left carotid system: Left CCA origin plaque and additional soft and calcified plaque before the bifurcation without stenosis. Motion artifact but better visualization of the proximal left ICA. No hemodynamically significant stenosis. Evidence of previous left carotid endarterectomy. Vertebral arteries: Tortuosity and atherosclerosis at the right subclavian artery origin with 60 % stenosis with respect to the distal vessel. Calcified plaque at the right vertebral artery origin with severe stenosis (series 17, image 89). Non dominant right vertebral artery with abundant additional atherosclerosis to the skull base, but the vessel remains patent despite tandem stenoses. Proximal left subclavian atherosclerosis with less than 50 % stenosis with respect to the distal vessel. Calcified plaque at the left vertebral artery origin with up to moderate V1 segment stenosis. Dominant left vertebral artery with abundant additional calcified plaque in the neck, at least moderate P2 stenosis series 16, image 206, and moderate to severe  V2 stenosis image 194. But the vessel remains patent to the skull base. CTA HEAD Posterior circulation: Diminutive and stenotic right vertebral V4 segment remains patent to the PICA origin, and possibly the vertebrobasilar junction. Dominant left vertebral V4 segment with mild to moderate stenosis at the cisterna magna, but remains patent and supplies the basilar. Left PICA origin is patent. Patent basilar artery without stenosis. Patent basilar tip, SCA and PCA origins. Mild bilateral PCA origin stenosis. Mild to moderate stenosis left PCA P2 segment. Other bilateral PCA branches are within normal limits. Anterior circulation: Both ICA siphons are patent. Left siphon calcified plaque with mild to moderate supraclinoid stenosis. Right siphon calcified plaque with only mild stenosis. Posterior communicating arteries are diminutive or absent. Patent carotid termini. Patent MCA and ACA origins. Dominant right and diminutive or absent left ACA A1 segments. Normal anterior communicating artery. Bilateral ACA  branches are within normal limits. Left MCA M1 segment and bifurcation are patent without stenosis. Left MCA branches are within normal limits. Right MCA M1 segment and bifurcation are patent without stenosis. No right MCA branch occlusion or high-grade stenosis is identified. Venous sinuses: Early contrast timing, not evaluated. Anatomic variants: Dominant left and diminutive right vertebral arteries, dominant right and diminutive or absent left A1 segments. Review of the MIP images confirms the above findings IMPRESSION: 1. Cytotoxic edema in the posterior Right MCA territory (ASPECTS 8) with no hemorrhage or mass effect. CTP does not detect infarct core, suggesting it might be subacute. And only small volume asymmetric T-max abnormality in the right hemisphere. 2. No right MCA branch occlusion identified. Motion artifact in the neck but suspicion of high-grade right ICA origin stenosis, probably RADIOGRAPHIC  STRING SIGN. Right siphon calcified plaque but only mild stenosis. 3. Evidence of previous Left Carotid Endarterectomy. Mild to moderate left siphon stenosis. 4. Atherosclerotic and highly stenotic bilateral vertebral arteries, the left is dominant. Mild bilateral PCA origin and moderate Left PCA P2 stenoses. 5. Aortic Atherosclerosis (ICD10-I70.0). Salient findings discussed by telephone with Dr. Rory Percy on 07/24/2021 at 12:06 . Electronically Signed   By: Genevie Ann M.D.   On: 07/24/2021 12:08    Procedures Procedures    Medications Ordered in ED Medications  sodium chloride flush (NS) 0.9 % injection 3 mL (3 mLs Intravenous Given 07/24/21 1205)  iohexol (OMNIPAQUE) 350 MG/ML injection 100 mL (100 mLs Intravenous Contrast Given 07/24/21 1148)    ED Course/ Medical Decision Making/ A&P Clinical Course as of 07/24/21 1520  Sun Jul 24, 2021  1223 Neurologist Dr Rory Percy recommending medical admission for stroke work-up including MRI of the brain and echocardiogram.  He will review the patient's medications give further recommendations about antiplatelet agents.  Patient does have a high-grade occlusion of the proximal right ICA, per the neurologist report, and this may need eventual revascularization, but not emergently.  Hospitalist paged for admission. [MT]  1250 Admitted to hospitalist [MT]    Clinical Course User Index [MT] Ricco Dershem, Carola Rhine, MD                           Medical Decision Making Amount and/or Complexity of Data Reviewed Labs: ordered. Radiology: ordered.  Risk Decision regarding hospitalization.   This patient presents to the ED with concern for unilateral weakness. This involves an extensive number of treatment options, and is a complaint that carries with it a high risk of complications and morbidity.  The differential diagnosis includes CVA versus metabolic derangement versus blood sugar derangement versus other  Co-morbidities that complicate the patient evaluation:  History of diabetes raises concern for stroke  Additional history obtained from EMS on arrival  Blood sugar somewhat elevated in 300s on arrival.  No hypoglycemia.  No report of seizure-like activity.  External records from outside source obtained and reviewed including EMS on arrival  I ordered and personally interpreted labs.  The pertinent results include: No emergent findings  I ordered imaging studies including CT imaging I independently visualized and interpreted imaging which showed possible right MCA edema versus infarct, proximal ICA occlusion I agree with the radiologist interpretation  The patient was maintained on a cardiac monitor.  I personally viewed and interpreted the cardiac monitored which showed an underlying rhythm of: NSR  I have reviewed the patients home medicines and have made adjustments as needed  Test Considered: Low suspicion for meningitis,  not feel that emergent LP indicated at this time  I requested consultation with the neurology,  and discussed lab and imaging findings as well as pertinent plan - they recommend: see ed course  After the interventions noted above, I reevaluated the patient and found that they have: stayed the same   Dispostion:  After consideration of the diagnostic results and the patients response to treatment, I feel that the patent would benefit from admit.         Final Clinical Impression(s) / ED Diagnoses Final diagnoses:  Left-sided weakness    Rx / DC Orders ED Discharge Orders     None         Mardel Grudzien, Carola Rhine, MD 07/24/21 [phone]

## 2021-07-24 NOTE — Consult Note (Signed)
Neurology Consultation  Reason for Consult: right gaze deviation and left sided weakness Referring Physician: Dr. Langston Masker  CC: found down with left sided weakness and right gaze deviation  History is obtained from:patient and chart  HPI: Wanda [name] is a 81 y.o. female with history of stroke, DM2, HLD and HTN who presents with left sided weakness and right gaze deviation.  She was last seen well by her daughter at 1700 yesterday evening and was found down this morning by her son with left sided weakness and right gaze deviation.  CT head shows loss of grey-white differentiation in the posterior right parietal lobe. Taken for CT angiography head and neck and perfusion studies.  No identifiable proximal intracranial large vessel occlusion.  Significant stenosis in the right ICA origin likely the culprit for her symptoms. Outside the window for IV thrombolysis. No emergent LVO for emergent thrombectomy.   LKW: 1700 on 7/15 TNK given?: no, outside of window IR Thrombectomy? No, no LVO Modified Rankin Scale: 1-No significant post stroke disability and can perform usual duties with stroke symptoms  ROS: A complete ROS was performed and is negative except as noted in the HPI. Marland Kitchen   Past Medical History:  Diagnosis Date   Arthritis    Carotid stenosis 11/2018   Cataract    surgery to remove   Diabetes mellitus    type 2   Hyperlipidemia    Hypertension    Skin cancer    Removed from face   Stroke [facility]) 09/19/2018   Urgency of urination    Urinary leakage      Family History  Problem Relation Age of Onset   Pancreatitis Father        deceased 17   Cancer Other    Cancer Brother        GI   Cancer Mother        liver   Breast cancer Mother 32   Cancer Son        terminal kidney   Breast cancer Maternal Aunt      Social History:   reports that she has never smoked. She has never used smokeless tobacco. She reports current alcohol use of about 1.0 standard drink of  alcohol per week. She reports that she does not use drugs.  Medications No current facility-administered medications for this encounter.  Current Outpatient Medications:    amLODipine (NORVASC) 5 MG tablet, TAKE 1 TABLET(5 MG) BY MOUTH DAILY, Disp: 90 tablet, Rfl: 3   blood glucose meter kit and supplies, Dispense based on patient and insurance preference. Pt just needs meter, Disp: 1 each, Rfl: 0   Blood Glucose Monitoring Suppl (ONE TOUCH ULTRA MINI) w/Device KIT, Use to test blood sugar daily as instructed. Dx: E11.65, Disp: 1 kit, Rfl: 0   cloNIDine (CATAPRES) 0.1 MG tablet, Take 1 tablet (0.1 mg total) by mouth 2 (two) times daily., Disp: 180 tablet, Rfl: 3   clopidogrel (PLAVIX) 75 MG tablet, Take 1 tablet (75 mg total) by mouth daily., Disp: 90 tablet, Rfl: 3   glipiZIDE (GLUCOTROL XL) 5 MG 24 hr tablet, Take 2 tabs in the morning and 1 tab in the evening., Disp: 270 tablet, Rfl: 3   glucose blood (ONE TOUCH ULTRA TEST) test strip, Test blood sugar 3 times a day. Dx code: 250.00, Disp: 100 each, Rfl: 12   glucose blood (ONETOUCH ULTRA) test strip, Use as instructed, Disp: 100 each, Rfl: 12   hydrochlorothiazide (HYDRODIURIL) 25 MG tablet, Take 1  tablet (25 mg total) by mouth daily., Disp: 90 tablet, Rfl: 3   insulin glargine (LANTUS SOLOSTAR) 100 UNIT/ML Solostar Pen, ADMINISTER 20 UNITS UNDER THE SKIN DAILY, Disp: 15 mL, Rfl: 3   Insulin Pen Needle (BD PEN NEEDLE NANO U/F) 32G X 4 MM MISC, USE TO INJECT INSULIN ONCE DAILY, Disp: 100 each, Rfl: 6   Insulin Syringe-Needle U-100 (INSULIN SYRINGE .5CC/31GX5/16") 31G X 5/16" 0.5 ML MISC, Use to inject insulin 1 time daily., Disp: 90 each, Rfl: 3   Lancets MISC, 1 each by Does not apply route 3 (three) times daily. Dx: E11.65, Disp: 100 each, Rfl: 0   Exam: Current vital signs: BP 121/76   Pulse 100   Temp 98.1 F (36.7 C)   Resp 20   Wt 80.6 kg   LMP  (LMP Unknown)   SpO2 99%   BMI 33.57 kg/m  Vital signs in last 24 hours: Temp:   [98.1 F (36.7 C)] 98.1 F (36.7 C) (07/16 1159) Pulse Rate:  [95-100] 100 (07/16 1159) Resp:  [20] 20 (07/16 1159) BP: (121-132)/(74-76) 121/76 (07/16 1159) SpO2:  [99 %-100 %] 99 % (07/16 1159) Weight:  [80.6 kg] 80.6 kg (07/16 1132)  GENERAL: Awake, alert, in no acute distress Psych: Affect appropriate for situation, patient is calm and cooperative with examination Head: Normocephalic and atraumatic, without obvious abnormality LUNGS: Normal respiratory effort. Non-labored breathing on room air Extremities: warm, well perfused, without obvious deformity  NEURO:  Mental Status: Awake, alert, and oriented to person, place, time, and situation. She is able to provide a clear and coherent history of present illness. Speech/Language: speech is slightly dysarthric but fluent.   Naming, repetition, fluency, and comprehension intact without aphasia  No neglect is noted Cranial Nerves:  II: PERRL Left visual field cut  III, IV, VI: EOMI. Lid elevation symmetric and full.  V: Sensation is intact to light touch and symmetrical to face.  VII: Face is symmetric resting and smiling.  VIII: Hearing intact to voice XII: Tongue protrudes midline without fasciculations.   Motor: 5/5 strength to RUE and RLE, 4/5 in LUE and LLE Tone is normal. Bulk is normal.  Sensation: Intact to light touch bilaterally in all four extremities. Extinction present on left side Gait: Deferred  NIHSS:  13   Labs I have reviewed labs in epic and the results pertinent to this consultation are:   CBC    Component Value Date/Time   WBC 12.5 (H) 07/24/2021 1122   RBC 4.68 07/24/2021 1122   HGB 13.9 07/24/2021 1126   HCT 41.0 07/24/2021 1126   PLT 269 07/24/2021 1122   MCV 84.8 07/24/2021 1122   MCV 90.8 06/19/2011 1602   MCH 30.8 07/24/2021 1122   MCHC 36.3 (H) 07/24/2021 1122   RDW 12.3 07/24/2021 1122   LYMPHSABS 1.6 07/24/2021 1122   MONOABS 1.0 07/24/2021 1122   EOSABS 0.0 07/24/2021 1122    BASOSABS 0.0 07/24/2021 1122    CMP     Component Value Date/Time   NA 137 07/24/2021 1126   K 3.4 (L) 07/24/2021 1126   CL 102 07/24/2021 1126   CO2 26 03/16/2021 1433   GLUCOSE 384 (H) 07/24/2021 1126   BUN 25 (H) 07/24/2021 1126   CREATININE 0.90 07/24/2021 1126   CREATININE 1.09 (H) 09/25/2019 1405   CALCIUM 10.2 03/16/2021 1433   PROT 7.7 03/16/2021 1433   ALBUMIN 4.5 03/16/2021 1433   AST 15 03/16/2021 1433   ALT 19 03/16/2021 1433  ALKPHOS 51 03/16/2021 1433   BILITOT 0.6 03/16/2021 1433   GFRNONAA >60 11/14/2018 0545   GFRAA >60 11/14/2018 0545    Lipid Panel     Imaging I have reviewed the images obtained:  CT-scan of the brain: cytotoxic edema in right parietal lobe, ASPECTS 8  CTA/CTP: suspicion of high grade right ICA origin stenosis with possible radiographic string sign, postsurgical changes in left carotid, stenotic bilateral vertebral arteries, no infarct core and <11 mL penumbra  Assessment: 81 year old patient with history of HTN, HLD, DM and stroke presents with acute onset left sided weakness and rightward gaze deviation.  Patient was unfortunately outside of the window for TNK and thrombectomy cannot be performed as she has no LVO.  Ct head reveals subacute infarct in right temporal lobe and no hemorrhage. The stroke might be older than the last known well based on the perfusion profile.   Impression:Acute ischemic stroke in patient with multiple risk factors due to symptomatic right internal carotid stenosis  Recommendations: - Admit for stroke workup - Permissive HTN x48 hrs from sx onset or until stroke ruled out by MRI goal BP <220/110. PRN labetalol or hydralazine if BP above these parameters. Avoid oral antihypertensives.  Also, avoid hypotension at all times.  Keep systolic blood pressure above 120 at all times - MRI brain wo contrast - TTE w/ bubble - Check A1c and LDL + refer to lipid clinic if LDL above goal as patient is intolerant to  multiple statins - DAPT with aspirin 81 and Plavix 75 - q4 hr neuro checks - STAT head CT for any change in neuro exam - Tele - PT/OT/SLP - Stroke education - Amb referral to neurology upon discharge   -Stroke team to consider referral to interventional neuroradiology or vascular for right internal carotid revascularization  Pt seen by NP/Neuro and later by MD. Note/plan to be edited by MD as needed.  [place] , MSN, AGACNP-BC [facility] See Amion for schedule and pager information 07/24/2021 12:13 PM  Attending Neurohospitalist Addendum Patient seen and examined with APP/Resident. Agree with the history and physical as documented above. Agree with the plan as documented, which I helped formulate. I have independently reviewed the chart, obtained history, review of systems and examined the patient.I have personally reviewed pertinent head/neck/spine imaging (CT/MRI). Please feel free to call with any questions.  -- Amie Portland, MD Neurologist [facility] Pager: [phone]

## 2021-07-24 NOTE — Plan of Care (Signed)
  Problem: Education: Goal: Ability to describe self-care measures that may prevent or decrease complications (Diabetes Survival Skills Education) will improve Outcome: Progressing   Problem: Coping: Goal: Ability to adjust to condition or change in health will improve Outcome: Progressing   Problem: Fluid Volume: Goal: Ability to maintain a balanced intake and output will improve Outcome: Progressing   Problem: Health Behavior/Discharge Planning: Goal: Ability to identify and utilize available resources and services will improve Outcome: Progressing Goal: Ability to manage health-related needs will improve Outcome: Progressing   Problem: Metabolic: Goal: Ability to maintain appropriate glucose levels will improve Outcome: Progressing   Problem: Nutritional: Goal: Maintenance of adequate nutrition will improve Outcome: Not Progressing   Problem: Skin Integrity: Goal: Risk for impaired skin integrity will decrease Outcome: Progressing   Problem: Tissue Perfusion: Goal: Adequacy of tissue perfusion will improve Outcome: Progressing   Problem: Education: Goal: Knowledge of General Education information will improve Description: Including pain rating scale, medication(s)/side effects and non-pharmacologic comfort measures Outcome: Progressing   Problem: Health Behavior/Discharge Planning: Goal: Ability to manage health-related needs will improve Outcome: Progressing

## 2021-07-24 NOTE — ED Notes (Signed)
RN unable to obtain trop. Pt is hard stick. RN reached out to IV team and phlebotomy.

## 2021-07-24 NOTE — ED Notes (Addendum)
IV team could not get trop as well as phlebotomy. MD notified.

## 2021-07-25 ENCOUNTER — Inpatient Hospital Stay (HOSPITAL_COMMUNITY): Payer: Medicare Other

## 2021-07-25 DIAGNOSIS — G936 Cerebral edema: Secondary | ICD-10-CM

## 2021-07-25 DIAGNOSIS — I6389 Other cerebral infarction: Secondary | ICD-10-CM | POA: Diagnosis not present

## 2021-07-25 DIAGNOSIS — R509 Fever, unspecified: Secondary | ICD-10-CM | POA: Diagnosis not present

## 2021-07-25 DIAGNOSIS — I639 Cerebral infarction, unspecified: Secondary | ICD-10-CM | POA: Diagnosis not present

## 2021-07-25 LAB — ECHOCARDIOGRAM COMPLETE BUBBLE STUDY
AR max vel: 3.58 cm2
AV Peak grad: 7.4 mmHg
Ao pk vel: 1.36 m/s
Area-P 1/2: 5.97 cm2
S' Lateral: 1.8 cm

## 2021-07-25 LAB — GLUCOSE, CAPILLARY
Glucose-Capillary: 311 mg/dL — ABNORMAL HIGH (ref 70–99)
Glucose-Capillary: 268 mg/dL — ABNORMAL HIGH (ref 70–99)
Glucose-Capillary: 281 mg/dL — ABNORMAL HIGH (ref 70–99)
Glucose-Capillary: 370 mg/dL — ABNORMAL HIGH (ref 70–99)

## 2021-07-25 LAB — HEMOGLOBIN A1C
Hgb A1c MFr Bld: 11.1 % — ABNORMAL HIGH (ref 4.8–5.6)
Mean Plasma Glucose: 271.87 mg/dL

## 2021-07-25 LAB — LIPID PANEL
Cholesterol: 214 mg/dL — ABNORMAL HIGH (ref 0–200)
HDL: 27 mg/dL — ABNORMAL LOW (ref >40)
LDL Cholesterol: 134 mg/dL — ABNORMAL HIGH (ref 0–99)
Total CHOL/HDL Ratio: 7.9 RATIO
Triglycerides: 267 mg/dL — ABNORMAL HIGH (ref <150)
VLDL: 53 mg/dL — ABNORMAL HIGH (ref 0–40)

## 2021-07-25 LAB — TSH: TSH: 0.803 u[IU]/mL (ref 0.350–4.500)

## 2021-07-25 LAB — SARS CORONAVIRUS 2 BY RT PCR: SARS Coronavirus 2 by RT PCR: NEGATIVE

## 2021-07-25 MED ORDER — INSULIN GLARGINE-YFGN 100 UNIT/ML ~~LOC~~ SOLN
20.0000 [IU] | Freq: Every day | SUBCUTANEOUS | Status: DC
  Administered 2021-07-25 – 2021-07-27 (×3): 20 [IU] via SUBCUTANEOUS
  Filled 2021-07-25 (×3): qty 0.2

## 2021-07-25 NOTE — Assessment & Plan Note (Addendum)
Uncontrolled hyperglycemia with fasting glucose > 400 mg/dl   Plan to increase basal insulin to 25 units, and add pre meal insulin 3 units. Resume glipizide 5 mg daily, patient intolerant to metformin.   Continue with insulin sliding scale for glucose cover and monitoring.

## 2021-07-25 NOTE — Progress Notes (Signed)
PROGRESS NOTE    Wanda [name]  QQI:[id number] DOB: 11/18/1940 DOA: 07/24/2021 PCP: Darreld Mclean, MD  Chief Complaint  Patient presents with   Code Stroke    Brief Narrative:  Wanda [name] is Wanda [name] 81 y.o. female with medical history significant of HTN, DM2, h/o CVA s/p left endarterectomy of the carotid, arthritis who presented after being found down.  Her daughter is present and adds to the history.  Patient was noted to be well at 5pm on Saturday the day PTA.  Next seen the following morning down and with left sided weakness.  The patient reports attempting to get up in the morning and falling against her dresser and then being found by her daughter.  She does not know if she felt weak at that time.  Daughter reports that patient has been forgetting to take her medications at time.  She is attempting to help her mother.  About 2 weeks ago, the patient decided to stop taking her plavix because she was not sure what it was doing for her.  She also has not been able to tolerate statins in the past.  She has Wanda [name] FH of CAD on her father's side.  She denies urinary symptoms, blood loss, chest pain or cough.    ED Course: In the ED, code stroke was called.  CTA of the head and neck showed cytotoxic edema in the posterior right MCA territory, suggestion of high grade right ICA stenosis, atherosclerotic and highly stenotic bilateral vertebral arteries.  Aortic atherosclerosis.  Neurology saw the patient (note pending).  Labwork revealed Wanda [name] bicarb of 18, Cr of 1.23, AG of 18, WBC of 12.5 with Wanda [name] slight neutrophil predominance.  Blood sugars in the 300s, last A1C around 12.     Assessment & Plan:   Principal Problem:   Acute stroke due to ischemia [facility]) Active Problems:   Cytotoxic brain edema (HCC)   Essential hypertension   Fever   Hyperlipidemia with target LDL less than 70   Diabetes mellitus   Multinodular goiter (nontoxic)   Primary osteoarthritis of left knee   Assessment and  Plan: * Acute stroke due to ischemia [facility]) Awaiting MRI CTA head/neck with cytotoxic edmea in posterior right MCA territory, no right MCA branch occlusion identified, suspicion of high grade right ICA origin stenosis (probably radiographic string sign), right siphon calcified plaque, but only mild stenosis, evidnece of previous L carotid endarterectomy, mild to moderate L siphon stenosis, atherosclerotic and highly stenotic bilateral vertebral arteries (L is dominant), mild bilateral Pca origin and moderate L PCA P2 stenoses Negative covid, TSH wnl a1c 11.1, LDL 134 Appreciate neurology recs  Currently plavix only due to aspirin allergy, needs lipid clinic referral (statin intolerance), avoid hypotension, keep SBP>120    Fever Negative covid UA not c/w UTI CXR without acute cardiopulm disease  Hyperlipidemia with target LDL less than 70 LDL 134 Statin intolerance Needs outpatient follow up with lipid clinic   Diabetes mellitus Hold glipizide Resume lantus, continue SSI  Multinodular goiter (nontoxic) TSH wnl  Primary osteoarthritis of left knee Pain control      DVT prophylaxis: heparin Code Status: full Family Communication: none at bedside Disposition:   Status is: Inpatient Remains inpatient appropriate because: need for continued workup/treatment   Consultants:  neurology  Procedures:  pending  Antimicrobials:  Anti-infectives (From admission, onward)    None       Subjective: No new complaints Asking for help using bathroom   Objective: Vitals:  07/24/21 2339 07/25/21 0328 07/25/21 0720 07/25/21 1200  BP: (!) 141/63 (!) 162/71 (!) 153/65 (!) 150/72  Pulse: 87 (!) 103 98 90  Resp: '18 16 19 18  '$ Temp: 99 F (37.2 C) 98.6 F (37 C) 97.6 F (36.4 C) 98.7 F (37.1 C)  TempSrc: Oral Oral Oral Oral  SpO2: 98% 98% 98%   Weight:      Height:        Intake/Output Summary (Last 24 hours) at 07/25/2021 1448 Last data filed at 07/25/2021  0200 Gross per 24 hour  Intake 531.25 ml  Output 150 ml  Net 381.25 ml   Filed Weights   07/24/21 1132 07/24/21 1700  Weight: 80.6 kg 78 kg    Examination:  General exam: Appears calm and comfortable  Respiratory system: unlabored Cardiovascular system: RRR Central nervous system: L sided weakness/neglect Extremities: no LEE  Data Reviewed: I have personally reviewed following labs and imaging studies  CBC: Recent Labs  Lab 07/24/21 1122 07/24/21 1126  WBC 12.5*  --   NEUTROABS 9.9*  --   HGB 14.4 13.9  HCT 39.7 41.0  MCV 84.8  --   PLT 269  --     Basic Metabolic Panel: Recent Labs  Lab 07/24/21 1122 07/24/21 1126  NA 137 137  K 3.5 3.4*  CL 101 102  CO2 18*  --   GLUCOSE 369* 384*  BUN 23 25*  CREATININE 1.23* 0.90  CALCIUM 10.0  --     GFR: Estimated Creatinine Clearance: 46.4 mL/min (by C-G formula based on SCr of 0.9 mg/dL).  Liver Function Tests: Recent Labs  Lab 07/24/21 1122  AST 26  ALT 22  ALKPHOS 45  BILITOT 1.1  PROT 7.8  ALBUMIN 4.3    CBG: Recent Labs  Lab 07/24/21 1120 07/24/21 1717 07/24/21 2101 07/25/21 0623 07/25/21 1139  GLUCAP 361* 398* 190* 311* 268*     Recent Results (from the past 240 hour(s))  SARS Coronavirus 2 by RT PCR (hospital order, performed in [facility] hospital lab) *cepheid single result test* Anterior Nasal Swab     Status: None   Collection Time: 07/25/21 10:15 AM   Specimen: Anterior Nasal Swab  Result Value Ref Range Status   SARS Coronavirus 2 by RT PCR NEGATIVE NEGATIVE Final    Comment: (NOTE) SARS-CoV-2 target nucleic acids are NOT DETECTED.  The SARS-CoV-2 RNA is generally detectable in upper and lower respiratory specimens during the acute phase of infection. The lowest concentration of SARS-CoV-2 viral copies this assay can detect is 250 copies / mL. Wanda [name] negative result does not preclude SARS-CoV-2 infection and should not be used as the sole basis for treatment or other patient  management decisions.  Wanda [name] negative result may occur with improper specimen collection / handling, submission of specimen other than nasopharyngeal swab, presence of viral mutation(s) within the areas targeted by this assay, and inadequate number of viral copies (<250 copies / mL). Cam Harnden negative result must be combined with clinical observations, patient history, and epidemiological information.  Fact Sheet for Patients:   https://www.patel.info/  Fact Sheet for Healthcare Providers: https://hall.com/  This test is not yet approved or  cleared by the [place] FDA and has been authorized for detection and/or diagnosis of SARS-CoV-2 by FDA under an Emergency Use Authorization (EUA).  This EUA will remain in effect (meaning this test can be used) for the duration of the COVID-19 declaration under Section 564(b)(1) of the Act, 21 U.S.C. section 360bbb-3(b)(1), unless the  authorization is terminated or revoked sooner.  Performed at [facility], [place] [address], [place], [place] [address]          Radiology Studies: DG CHEST PORT 1 VIEW  Result Date: 07/25/2021 CLINICAL DATA:  An 81 year old female presents for evaluation of fever, history of stroke. EXAM: PORTABLE CHEST 1 VIEW COMPARISON:  September 19, 2018 FINDINGS: EKG leads project over the chest. Cardiomediastinal contours and hilar structures are stable. Lungs are clear. No sign of effusion or pneumothorax on frontal radiograph. On limited assessment no acute skeletal process. IMPRESSION: No acute cardiopulmonary disease. Electronically Signed   By: Zetta Bills M.D.   On: 07/25/2021 09:28   CT ANGIO HEAD NECK W WO CM W PERF (CODE STROKE)  Result Date: 07/24/2021 CLINICAL DATA:  81 year old female code stroke. EXAM: CT ANGIOGRAPHY HEAD AND NECK CT PERFUSION BRAIN TECHNIQUE: Multidetector CT imaging of the head and neck was performed using the standard protocol during bolus  administration of intravenous contrast. Multiplanar CT image reconstructions and MIPs were obtained to evaluate the vascular anatomy. Carotid stenosis measurements (when applicable) are obtained utilizing NASCET criteria, using the distal internal carotid diameter as the denominator. Multiphase CT imaging of the brain was performed following IV bolus contrast injection. Subsequent parametric perfusion maps were calculated using RAPID software. RADIATION DOSE REDUCTION: This exam was performed according to the departmental dose-optimization program which includes automated exposure control, adjustment of the mA and/or kV according to patient size and/or use of iterative reconstruction technique. CONTRAST:  150m OMNIPAQUE IOHEXOL 350 MG/ML SOLN COMPARISON:  Brain MRI 09/19/2018.  Head CT 09/20/2018. FINDINGS: CT HEAD FINDINGS Brain: Cerebral volume not significantly changed since 2020. No midline shift, mass effect, or evidence of intracranial mass lesion. No ventriculomegaly. Patchy chronic bilateral cerebral white matter hypodensity. Cytotoxic edema right parietal lobe series 4, image 26. Subtle associated increased hypodensity in the right centrum semiovale white matter. ASPECTS 8. Other right MCA territory gray-white matter differentiation appears stable, maintained. No acute intracranial hemorrhage identified. Vascular: Calcified atherosclerosis at the skull base. No suspicious intracranial vascular hyperdensity. Skull: No acute osseous abnormality identified. Sinuses/Orbits: Visualized paranasal sinuses and mastoids are stable and well aerated. Other: Rightward gaze. Visualized scalp soft tissues are within normal limits. Preliminary results were communicated to Dr. ARory Percyat 11:38 am on 07/24/2021 by text page via the [facility]. CT Brain Perfusion Findings: ASPECTS: 8 CBF (<30%) Volume: 0 mL. No CBF or CBV parameter abnormalities detected. Perfusion (Tmax>6.0s) volume: 11 mL, some corresponding to  the abnormal ASPECTS and some also in the right temporal lobe Mismatch Volume: Less than 11 mL Infarction Location:Right MCA territory is implicated CTA NECK Skeleton: No acute osseous abnormality identified. Age-appropriate cervical spine degeneration. Upper chest: Negative upper lungs and visible mediastinum. Other neck: Heterogeneously enlarged thyroid In the setting of significant comorbidities or limited life expectancy, no follow-up recommended (ref: J Am Coll Radiol. 2015 Feb;12(2): 143-50).Motion artifact. Aortic arch: Calcified aortic atherosclerosis. 3 vessel arch configuration. Right carotid system: Brachiocephalic artery and proximal right CCA plaque with no significant stenosis. Artifact obscures detail of the right carotid bifurcation where at least moderate atherosclerosis is present. At the level of the right ICA bulb high-grade stenosis is suspected approaching Camdan Burdi radiographic string sign series 16, image 193. But the right ICA remains patent to the skull base. Left carotid system: Left CCA origin plaque and additional soft and calcified plaque before the bifurcation without stenosis. Motion artifact but better visualization of the proximal left ICA. No  hemodynamically significant stenosis. Evidence of previous left carotid endarterectomy. Vertebral arteries: Tortuosity and atherosclerosis at the right subclavian artery origin with 60 % stenosis with respect to the distal vessel. Calcified plaque at the right vertebral artery origin with severe stenosis (series 17, image 89). Non dominant right vertebral artery with abundant additional atherosclerosis to the skull base, but the vessel remains patent despite tandem stenoses. Proximal left subclavian atherosclerosis with less than 50 % stenosis with respect to the distal vessel. Calcified plaque at the left vertebral artery origin with up to moderate V1 segment stenosis. Dominant left vertebral artery with abundant additional calcified plaque in the  neck, at least moderate P2 stenosis series 16, image 206, and moderate to severe V2 stenosis image 194. But the vessel remains patent to the skull base. CTA HEAD Posterior circulation: Diminutive and stenotic right vertebral V4 segment remains patent to the PICA origin, and possibly the vertebrobasilar junction. Dominant left vertebral V4 segment with mild to moderate stenosis at the cisterna magna, but remains patent and supplies the basilar. Left PICA origin is patent. Patent basilar artery without stenosis. Patent basilar tip, SCA and PCA origins. Mild bilateral PCA origin stenosis. Mild to moderate stenosis left PCA P2 segment. Other bilateral PCA branches are within normal limits. Anterior circulation: Both ICA siphons are patent. Left siphon calcified plaque with mild to moderate supraclinoid stenosis. Right siphon calcified plaque with only mild stenosis. Posterior communicating arteries are diminutive or absent. Patent carotid termini. Patent MCA and ACA origins. Dominant right and diminutive or absent left ACA A1 segments. Normal anterior communicating artery. Bilateral ACA branches are within normal limits. Left MCA M1 segment and bifurcation are patent without stenosis. Left MCA branches are within normal limits. Right MCA M1 segment and bifurcation are patent without stenosis. No right MCA branch occlusion or high-grade stenosis is identified. Venous sinuses: Early contrast timing, not evaluated. Anatomic variants: Dominant left and diminutive right vertebral arteries, dominant right and diminutive or absent left A1 segments. Review of the MIP images confirms the above findings IMPRESSION: 1. Cytotoxic edema in the posterior Right MCA territory (ASPECTS 8) with no hemorrhage or mass effect. CTP does not detect infarct core, suggesting it might be subacute. And only small volume asymmetric T-max abnormality in the right hemisphere. 2. No right MCA branch occlusion identified. Motion artifact in the neck  but suspicion of high-grade right ICA origin stenosis, probably RADIOGRAPHIC STRING SIGN. Right siphon calcified plaque but only mild stenosis. 3. Evidence of previous Left Carotid Endarterectomy. Mild to moderate left siphon stenosis. 4. Atherosclerotic and highly stenotic bilateral vertebral arteries, the left is dominant. Mild bilateral PCA origin and moderate Left PCA P2 stenoses. 5. Aortic Atherosclerosis (ICD10-I70.0). Salient findings discussed by telephone with Dr. Rory Percy on 07/24/2021 at 12:06 . Electronically Signed   By: Genevie Ann M.D.   On: 07/24/2021 12:08        Scheduled Meds:  clopidogrel  75 mg Oral Daily   heparin  5,000 Units Subcutaneous Q8H   insulin aspart  0-20 Units Subcutaneous TID WC   insulin aspart  0-5 Units Subcutaneous QHS   insulin glargine-yfgn  20 Units Subcutaneous Daily   Continuous Infusions:  sodium chloride 50 mL/hr at 07/24/21 1858     LOS: 1 day    Time spent: over 30 min    Fayrene Helper, MD [facility]   To contact the attending provider between 7A-7P or the covering provider during after hours 7P-7A, please log into the web site [email or web address] and  access using universal [place] password for that web site. If you do not have the password, please call the hospital operator.  07/25/2021, 2:48 PM

## 2021-07-25 NOTE — Progress Notes (Addendum)
STROKE TEAM PROGRESS NOTE   INTERVAL HISTORY VSS with elevated BP, afebrile. BG elevated in 300s. WBCs 12.5. No acute events. We discussed history, current symptoms, ongoing work up, diagnosis and plan of care. All questions were answered.   CT head shows cytotoxic edema in the right posterior MCA territory and CT angiogram shows string sign with high-grade stenosis of proximal right ICA with no right MCA branch occlusion with good intracranial flow.  There is evidence of left carotid endarterectomy.  Both vertebral arteries also have high-grade stenosis. Vitals:   07/24/21 2339 07/25/21 0328 07/25/21 0720 07/25/21 1200  BP: (!) 141/63 (!) 162/71 (!) 153/65 (!) 150/72  Pulse: 87 (!) 103 98 90  Resp: '18 16 19 18  '$ Temp: 99 F (37.2 C) 98.6 F (37 C) 97.6 F (36.4 C) 98.7 F (37.1 C)  TempSrc: Oral Oral Oral Oral  SpO2: 98% 98% 98%   Weight:      Height:       CBC:  Recent Labs  Lab 07/24/21 1122 07/24/21 1126  WBC 12.5*  --   NEUTROABS 9.9*  --   HGB 14.4 13.9  HCT 39.7 41.0  MCV 84.8  --   PLT 269  --    Basic Metabolic Panel:  Recent Labs  Lab 07/24/21 1122 07/24/21 1126  NA 137 137  K 3.5 3.4*  CL 101 102  CO2 18*  --   GLUCOSE 369* 384*  BUN 23 25*  CREATININE 1.23* 0.90  CALCIUM 10.0  --    Lipid Panel:  Recent Labs  Lab 07/25/21 0441  CHOL 214*  TRIG 267*  HDL 27*  CHOLHDL 7.9  VLDL 53*  LDLCALC 134*   HgbA1c:  Recent Labs  Lab 07/25/21 0441  HGBA1C 11.1*   Urine Drug Screen: No results for input(s): "LABOPIA", "COCAINSCRNUR", "LABBENZ", "AMPHETMU", "THCU", "LABBARB" in the last 168 hours.  Alcohol Level  Recent Labs  Lab 07/24/21 1122  ETH <10    IMAGING past 24 hours DG CHEST PORT 1 VIEW  Result Date: 07/25/2021 CLINICAL DATA:  An 81 year old female presents for evaluation of fever, history of stroke. EXAM: PORTABLE CHEST 1 VIEW COMPARISON:  September 19, 2018 FINDINGS: EKG leads project over the chest. Cardiomediastinal contours and  hilar structures are stable. Lungs are clear. No sign of effusion or pneumothorax on frontal radiograph. On limited assessment no acute skeletal process. IMPRESSION: No acute cardiopulmonary disease. Electronically Signed   By: Zetta Bills M.D.   On: 07/25/2021 09:28    PHYSICAL EXAM GENERAL: Pleasant elderly Caucasian lady awake, alert, in no acute distress Psych: Affect appropriate for situation, patient is calm and cooperative with examination Head: Normocephalic and atraumatic, without obvious abnormality LUNGS: Normal respiratory effort. Non-labored breathing on room air Extremities: warm, well perfused, without obvious deformity   NEURO:  Mental Status:  Speech/Language: speech is slightly dysarthric but fluent.   Naming, repetition, fluency, and comprehension intact without aphasia  No neglect is noted Cranial Nerves:  II: PERRL Left visual field cut  III, IV, VI: EOMI. Lid elevation symmetric and full.  V: Sensation is intact to light touch and symmetrical to face.  VII: Face is symmetric resting and smiling.  VIII: Hearing intact to voice XII: Tongue protrudes midline without fasciculations.   Motor: 5/5 strength to RUE and RLE, 4/5 in LUE and LLE.  Weakness of left grip.  Diminished fine finger movements on the left.  Orbits right over left upper extremity. Tone is normal. Bulk is normal.  Sensation: Intact to light touch bilaterally in all four extremities. Extinction present on left side Gait: Deferred ASSESSMENT/PLAN Wanda [name] is a 81 y.o. 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 She was last seen well by her daughter at 42 on 7/15  and was found down on the morning of 7/16  by her son with  left sided weakness and right gaze deviation. Upon arrival to ED CT head showed loss of grey-white differentiation in the posterior right parietal lobe. Taken for CT angiography head and neck and perfusion studies.  No identifiable proximal intracranial large vessel occlusion.  Significant stenosis in the right ICA origin was suspected to likely be the culprit for her symptoms.Outside the window for IV thrombolysis. No emergent LVO for emergent thrombectomy.   She reportedly stopped taking her plavix in a self directed fashion 2 weeks prior to this event.   Subacute infarct of the right temporal lobe likely symptomatic from high-grade proximal right ICA stenosis   with stroke work up ongoing  CT-scan of the brain: cytotoxic edema in right parietal lobe, ASPECTS 8  CTA/CTP: suspicion of high grade right ICA origin stenosis with possible radiographic string sign, postsurgical changes in left carotid, stenotic bilateral vertebral arteries, no infarct core and <11 mL penumbra  MRI  PENDING Carotid Doppler  PENDING 2D Echo PENDING LDL 134 HgbA1c 11.1 VTE prophylaxis -     Diet   Diet heart healthy/carb modified Room service appropriate? Yes; Fluid consistency: Thin  Plavix prescribed prior to admission but patient was not taking has a reported ASA allergy Recommend plavix alone daily at present  Continue telemetry monitoring Therapy recommendations:  IPR Disposition:  TBD  Hypertension Stable Permissive hypertension (OK if < 220/120) but gradually normalize in 5-7 days Long-term BP goal normotensive  Hyperlipidemia No home meds  LDL 134, goal < 70 High intensity statin is indicated with multiple reported statin intolerances. Please refer to lipid clinic.   Diabetes type II severely uncontrolled HgbA1c 11.1, goal < 7.0 CBGs Recent Labs    07/24/21 2101 07/25/21 0623 07/25/21 1139  GLUCAP 190* 311* 268*    [facility] PCP follow up to manage and improve diabetes control  Other  Stroke Risk Factors Advanced Age >/= 67  Current ETOH use, alcohol level <10, advised to drink no more than one drink a day Obesity, Body mass index is 32.49 kg/m., BMI >/= 30 associated with increased stroke risk, recommend weight loss, diet and exercise as appropriate  Hx stroke High risk for Obstructive sleep apnea  Hospital day # 1  This patient was seen and evaluated with Dr. Leonie Man. He directed the plan of care.  Charlene Brooke, NP-C    STROKE MD NOTE :  I have personally obtained history,examined this patient, reviewed notes, independently viewed imaging studies, participated in medical decision making and plan of care.ROS completed by me personally and pertinent positives fully documented  I have made any additions or clarifications directly to the above note. Agree with note above.  Patient presented to the left hemiparesis and brain imaging shows right MCA infarct with symptomatic high-grade proximal right carotid stenosis.  Recommend check carotid ultrasound and she will likely need emergent right carotid revascularization during this admission prior to discharge.  Continue Plavix alone  for stroke prevention as patient has aspirin allergy.  Avoid hypotension and keep systolic blood pressure in the 130-150 range.  Continue ongoing stroke work-up.  Physical occupational and speech therapy consults.  Discussed with Dr. Florene Glen.  Greater than 50% time during this 50-minute visit was spent on counseling and coordination of care about stroke and symptomatic carotid stenosis and discussion about plans for possible carotid revascularization and answering questions.  Antony Contras, MD Medical Director [facility] Pager: [phone] 07/25/2021 3:57 PM  To contact Stroke Continuity provider, please refer to [email or web address] After hours, contact General Neurology

## 2021-07-25 NOTE — Assessment & Plan Note (Addendum)
Patient continue to have right sided weakness more upper than lower extremity.   Plan to continue antiplatelet therapy with clopidogrel.  Patient allergic to aspirin.   Vascular surgery recommends against carotid endarterectomy, possible medical therapy only and or stenting per IR.  Her family has been contacted per IR for appropriate timing for endovascular intervention.   Continue with Pt and Ot, Inpatient rehab when medically stable.   Metabolic encephalopathy. Today her confusion has resolved Continue with trazodone at night for sleep.  Avoid benzodiazepines.  Out of bed to chair tid with meals.

## 2021-07-25 NOTE — Assessment & Plan Note (Signed)
Negative covid UA not c/w UTI CXR without acute cardiopulm disease Resolved, follow

## 2021-07-25 NOTE — Plan of Care (Signed)

## 2021-07-25 NOTE — Hospital Course (Signed)
Wanda [name] was admitted with the working diagnosis of acute CVA.   81 y.o. female with medical history significant of HTN, DM2, h/o CVA s/p left endarterectomy of the carotid, and arthritis who presented after being found down.  Her daughter is present and adds to the history.  Patient was noted to be well at 5pm on Saturday the day PTA.  Next seen the following morning down and with left sided weakness.  The patient reports attempting to get up in the morning and falling against her dresser and then being found by her daughter.  She does not know if she felt weak at that time.  Daughter reports that patient has been forgetting to take her medications at time.  She is attempting to help her mother.  About 2 weeks ago, the patient decided to stop taking her plavix because she was not sure what it was doing for her.  She also has not been able to tolerate statins in the past.  She has Wanda [name] FH of CAD on her father's side.  She denies urinary symptoms, blood loss, chest pain or cough.   Na 137, K 3,5 Cl 101 bicarbonate 18, glucose 369, bun 23, cr 1,23 anion gap 18  Wbc 12.5 hgb 14.4 plt 269   Urine SG >1.046, negative protein, >500 glucose, negative leukocytes.  Sars covid 19 negative   Chest radiograph with no cardiomegaly, no infiltrates.   Head Ct with cytotoxic edema in the posterior right MCA territory, with no hemorrhage or mass effect. No right MCA branch occlusion. Suspected high grade stenosis right ICA.   Brain MRI with multiple evolving acute to subacute infarcts both cerebral hemispheres (right more than left). Largest infarct in the right parietal lobe in the MCA distribution.    Carotid US with right ICA 60 to 79% stenosis, ECA >50 % stenosis.  Left carotid with 1-39% in the ICA and non hemodynamic plaque <50% in the CEA.   Vascular surgery has recommended maximal medical therapy or IR carotid stenting.   Ultimate plan for inpatient rehab.

## 2021-07-25 NOTE — Progress Notes (Signed)
  Transition of Care [facility]) Screening Note   Patient Details  Name: Wanda [name] Date of Birth: 07-15-1940   Transition of Care [facility]) CM/SW Contact:    Pollie Friar, RN Phone Number: 07/25/2021, 1:17 PM   From home alone. CIR recommended.  Transition of Care Department [facility]) has reviewed patient. We will continue to monitor patient advancement through interdisciplinary progression rounds. If new patient transition needs arise, please place a TOC consult.

## 2021-07-25 NOTE — Evaluation (Signed)
Occupational Therapy Evaluation Patient Details Name: Wanda [name] MRN: [id number] DOB: 30-Oct-1940 Today's Date: 07/25/2021   History of Present Illness Pt is an 81 y.o. female presenting to [facility] ED on 07/24/2021 with left-sided weakness and right gaze deviation. CT + subacute infarct in R temporal lobe. MRI pending. PMH includes: arthritis, carotid stenosis, DM2, HTN, urinary leakage, s/p L carotid endarterectomy, B TKA.   Clinical Impression   PTA patient reports independent with ADLs, mobility using RW and IADLs (but not driving).  Admitted for above and presents with problem list below, including L sided hemiparesis (UE>LE), L neglect, impaired vision, impaired balance, impaired cognition and decreased activity tolerance.  Cognitively, pt demonstrates ability to follow some simple  1 step commands but as session progressing fatigues and requires increased cueing to follow simple commands; she is aware of having a stroke but demonstrates poor awareness to L sided environmental neglect, poor problem solving and motor planning.  She currently requires mod assist +2 for bed mobility, mod assist +2 for toilet transfers (simulated) and mod to total assist for ADLs.  Recommend further visual and cognitive assessment next session.  Based on performance today, believe she will benefit from continued OT services acutely and after dc at AIR level to optimize independence, safety with Adls/mobility.      Recommendations for follow up therapy are one component of a multi-disciplinary discharge planning process, led by the attending physician.  Recommendations may be updated based on patient status, additional functional criteria and insurance authorization.   Follow Up Recommendations  Acute inpatient rehab (3hours/day)    Assistance Recommended at Discharge Frequent or constant Supervision/Assistance  Patient can return home with the following Two people to help with walking and/or transfers;A lot of  help with bathing/dressing/bathroom;Assistance with cooking/housework;Assistance with feeding;Direct supervision/assist for medications management;Assist for transportation;Help with stairs or ramp for entrance;Direct supervision/assist for financial management    Functional Status Assessment  Patient has had a recent decline in their functional status and demonstrates the ability to make significant improvements in function in a reasonable and predictable amount of time.  Equipment Recommendations  Other (comment) (defer)    Recommendations for Other Services Rehab consult;Speech consult     Precautions / Restrictions Precautions Precautions: Fall Precaution Comments: L neglect Restrictions Weight Bearing Restrictions: No      Mobility Bed Mobility Overal bed mobility: Needs Assistance Bed Mobility: Rolling, Sidelying to Sit Rolling: Mod assist, +2 for safety/equipment Sidelying to sit: Mod assist, +2 for physical assistance, +2 for safety/equipment, HOB elevated       General bed mobility comments: assist for LB and trunk on L side, scooting foward.  cueing for sequencing and technique.    Transfers Overall transfer level: Needs assistance Equipment used: 2 person hand held assist Transfers: Sit to/from Stand, Bed to chair/wheelchair/BSC Sit to Stand: +2 physical assistance, +2 safety/equipment, Min assist     Step pivot transfers: Mod assist, +2 physical assistance, +2 safety/equipment     General transfer comment: pt stood from EOB with min assist +2, once upright took steps to recliner towards R side with mod assist +2 for weightshifting and fully guiding hips into chair as pt began to sit prematurely      Balance Overall balance assessment: Needs assistance Sitting-balance support: Single extremity supported, No upper extremity supported, Feet supported Sitting balance-Leahy Scale: Fair Sitting balance - Comments: preference to R UE support, able to maintain  balance without UE support.  at best min guard but dynamically but to min  assist at times Postural control: Right lateral lean Standing balance support: Bilateral upper extremity supported, During functional activity, Single extremity supported Standing balance-Leahy Scale: Poor Standing balance comment: relies on external and UE support                           ADL either performed or assessed with clinical judgement   ADL Overall ADL's : Needs assistance/impaired     Grooming: Moderate assistance;Sitting           Upper Body Dressing : Moderate assistance;Sitting   Lower Body Dressing: Total assistance;+2 for physical assistance;+2 for safety/equipment;Sit to/from stand   Toilet Transfer: Moderate assistance;+2 for physical assistance;+2 for safety/equipment;Stand-pivot;Cueing for safety;Cueing for sequencing Toilet Transfer Details (indicate cue type and reason): simulated to recliner, bilateral hand held support         Functional mobility during ADLs: Moderate assistance;+2 for physical assistance;+2 for safety/equipment;Cueing for safety;Cueing for sequencing       Vision Baseline Vision/History: 1 Wears glasses Ability to See in Adequate Light: 0 Adequate (all the time glasses, but not present) Patient Visual Report: No change from baseline Vision Assessment?: Yes Eye Alignment: Within Functional Limits Ocular Range of Motion: Within Functional Limits Alignment/Gaze Preference: Gaze right;Head tilt Tracking/Visual Pursuits: Impaired - to be further tested in functional context (pt tracks well to R side, but demonstrates difficulty attending and maintaining gaze on L side, requiring cueing and head turns.) Visual Fields: No apparent deficits (cueing to avoid eye shifts) Additional Comments: patient presents with R gaze preference, when cued able to turn head/eyes to L side but does not complete volitionally.  She demonstrates diffculty following mulitple  step commands and attending to tasks therefore difficult to assess. Clock L side, able to find with max cueing (even after locating 1st time), but does requires signifincant head turn to find- once found clock able to read without difficulty. Continue assessment.     Perception     Praxis      Pertinent Vitals/Pain Pain Assessment Pain Assessment: No/denies pain     Hand Dominance Right   Extremity/Trunk Assessment Upper Extremity Assessment Upper Extremity Assessment: LUE deficits/detail LUE Deficits / Details: hemiparesis in L UE, pt able to squeeze hand and initate movements but limited by strength. Appears to use compensatory movements during testing, but grossly 2 to 2+/5 MMT. Sensation questionable. L neglect. LUE Sensation: decreased light touch;decreased proprioception LUE Coordination: decreased fine motor;decreased gross motor   Lower Extremity Assessment Lower Extremity Assessment: Defer to PT evaluation   Cervical / Trunk Assessment Cervical / Trunk Assessment:  (R gaze preference with R head turn, cueing to turn head to L)   Communication Communication Communication: No difficulties   Cognition Arousal/Alertness: Awake/alert Behavior During Therapy: Flat affect, Anxious Overall Cognitive Status: Impaired/Different from baseline Area of Impairment: Attention, Safety/judgement, Following commands, Problem solving, Awareness                   Current Attention Level: Sustained   Following Commands: Follows one step commands with increased time, Follows multi-step commands inconsistently, Follows one step commands inconsistently Safety/Judgement: Decreased awareness of safety, Decreased awareness of deficits Awareness: Emergent Problem Solving: Slow processing, Decreased initiation, Difficulty sequencing, Requires verbal cues, Requires tactile cues General Comments: pt oriented to situation, place and month/year, she demonstrates slow processing and motor  planning with mobility/ADL tasks, decreased awareness and sequencing with transfers requiring simple 1 step commands and multimodal cueing (appears to have more  difficulty following commands as session progresses).  She is fearful OOB, presents with L sided neglect.     General Comments  pt reports 'not feeling right' but not feeling dizzy or lightheaded.  BP stable in recliner 150/71 (94).    Exercises     Shoulder Instructions      Home Living Family/patient expects to be discharged to:: Private residence Living Arrangements: Alone Available Help at Discharge: Family;Available PRN/intermittently ("most of the time") Type of Home: Apartment Home Access: Level entry     Home Layout: One level     Bathroom Shower/Tub: Teacher, early years/pre: Handicapped height     Home Equipment: Conservation officer, nature (2 wheels);Cane - single point;Crutches          Prior Functioning/Environment Prior Level of Function : Independent/Modified Independent (Does not drive)                        OT Problem List: Decreased strength;Decreased range of motion;Decreased activity tolerance;Impaired balance (sitting and/or standing);Impaired vision/perception;Decreased cognition;Decreased coordination;Decreased safety awareness;Decreased knowledge of use of DME or AE;Decreased knowledge of precautions;Impaired tone;Impaired sensation;Obesity;Impaired UE functional use      OT Treatment/Interventions: Self-care/ADL training;Neuromuscular education;DME and/or AE instruction;Therapeutic activities;Cognitive remediation/compensation;Visual/perceptual remediation/compensation;Patient/family education;Balance training;Splinting    OT Goals(Current goals can be found in the care plan section) Acute Rehab OT Goals Patient Stated Goal: none stated Time For Goal Achievement: 08/08/21 Potential to Achieve Goals: Good  OT Frequency: Min 2X/week    Co-evaluation PT/OT/SLP  Co-Evaluation/Treatment: Yes Reason for Co-Treatment: Complexity of the patient's impairments (multi-system involvement);For patient/therapist safety;To address functional/ADL transfers PT goals addressed during session: Mobility/safety with mobility;Balance;Proper use of DME;Strengthening/ROM OT goals addressed during session: ADL's and self-care      AM-PAC OT "6 Clicks" Daily Activity     Outcome Measure Help from another person eating meals?: A Lot Help from another person taking care of personal grooming?: A Lot Help from another person toileting, which includes using toliet, bedpan, or urinal?: Total Help from another person bathing (including washing, rinsing, drying)?: A Lot Help from another person to put on and taking off regular upper body clothing?: A Lot Help from another person to put on and taking off regular lower body clothing?: Total 6 Click Score: 10   End of Session Equipment Utilized During Treatment: Gait belt Nurse Communication: Mobility status  Activity Tolerance: Patient tolerated treatment well Patient left: in chair;with call bell/phone within reach;with chair alarm set  OT Visit Diagnosis: Other abnormalities of gait and mobility (R26.89);Hemiplegia and hemiparesis;Other symptoms and signs involving cognitive function Hemiplegia - Right/Left: Left Hemiplegia - dominant/non-dominant: Non-Dominant Hemiplegia - caused by: Cerebral infarction                Time: 0233-4356 OT Time Calculation (min): 28 min Charges:  OT General Charges $OT Visit: 1 Visit OT Evaluation $OT Eval Moderate Complexity: 1 Mod  Jolaine Artist, OT Acute Rehabilitation Services Office [phone]   Delight Stare 07/25/2021, 11:02 AM

## 2021-07-25 NOTE — Assessment & Plan Note (Signed)
LDL 134 Statin intolerance Needs outpatient follow up with lipid clinic

## 2021-07-25 NOTE — Evaluation (Signed)
Physical Therapy Evaluation  Patient Details Name: Wanda [name] MRN: [id number] DOB: August 15, 1940 Today's Date: 07/25/2021  History of Present Illness  Pt is an 81 y.o. female presenting to [facility] ED on 07/24/2021 with left-sided weakness and right gaze deviation. CT + subacute infarct in R temporal lobe. MRI pending. PMH includes: arthritis, carotid stenosis, DM2, HTN, urinary leakage, s/p L carotid endarterectomy, B TKA.   Clinical Impression  Pt admitted with above diagnosis. Pt currently with functional limitations due to the deficits listed below (see PT Problem List). At the time of PT eval pt was able to perform transfers with up to +2 mod assist for balance support and safety. Pt reports feeling "not right" once sitting up but did not change for the rest of the session. BP during this time 150/71. Noted significant L side neglect and LUE strength deficits. LLE strength deficits less severe. Recommend AIR follow up to maximize functional independence and safety to prepare for d/c home with family support.      Recommendations for follow up therapy are one component of a multi-disciplinary discharge planning process, led by the attending physician.  Recommendations may be updated based on patient status, additional functional criteria and insurance authorization.  Follow Up Recommendations Acute inpatient rehab (3hours/day)      Assistance Recommended at Discharge Frequent or constant Supervision/Assistance  Patient can return home with the following  Two people to help with walking and/or transfers;Two people to help with bathing/dressing/bathroom;Assistance with cooking/housework;Assist for transportation;Direct supervision/assist for medications management    Equipment Recommendations Other (comment) (TBD by next level of care)  Recommendations for Other Services       Functional Status Assessment Patient has had a recent decline in their functional status and demonstrates the  ability to make significant improvements in function in a reasonable and predictable amount of time.     Precautions / Restrictions Precautions Precautions: Fall Precaution Comments: L neglect Restrictions Weight Bearing Restrictions: No      Mobility  Bed Mobility Overal bed mobility: Needs Assistance Bed Mobility: Rolling, Sidelying to Sit Rolling: Mod assist, +2 for safety/equipment Sidelying to sit: Mod assist, +2 for physical assistance, +2 for safety/equipment, HOB elevated       General bed mobility comments: Assist for LE advancement towards EOB (LE more than RLE), Multimodal cues to reach for railing, and assist to elevate trunk to full sitting position    Transfers Overall transfer level: Needs assistance Equipment used: 2 person hand held assist Transfers: Sit to/from Stand, Bed to chair/wheelchair/BSC Sit to Stand: +2 physical assistance, +2 safety/equipment, Min assist   Step pivot transfers: Mod assist, +2 physical assistance, +2 safety/equipment       General transfer comment: pt stood from EOB with min assist +2, once upright took steps to recliner towards R side with mod assist +2 for weightshifting and fully guiding hips into chair as pt began to sit prematurely    Ambulation/Gait             Pre-gait activities: Weight shifting and clearing floor with feet in place in attempts to progress to transfer to chair. Poor clearance and required assist to weight shift.    Stairs            Wheelchair Mobility    Modified Rankin (Stroke Patients Only) Modified Rankin (Stroke Patients Only) Pre-Morbid Rankin Score: No symptoms Modified Rankin: Severe disability     Balance Overall balance assessment: Needs assistance Sitting-balance support: Single extremity supported, No upper extremity supported,  Feet supported Sitting balance-Leahy Scale: Fair Sitting balance - Comments: preference to R UE support, able to maintain balance without UE  support.  at best min guard but dynamically but to min assist at times Postural control: Right lateral lean Standing balance support: Bilateral upper extremity supported, During functional activity, Single extremity supported Standing balance-Leahy Scale: Poor Standing balance comment: relies on external and UE support                             Pertinent Vitals/Pain Pain Assessment Pain Assessment: No/denies pain    Home Living Family/patient expects to be discharged to:: Private residence Living Arrangements: Alone Available Help at Discharge: Family;Available PRN/intermittently Type of Home: Apartment Home Access: Level entry       Home Layout: One level Home Equipment: Conservation officer, nature (2 wheels);Cane - single point;Crutches      Prior Function Prior Level of Function : Independent/Modified Independent (Does not drive)                     Hand Dominance   Dominant Hand: Right    Extremity/Trunk Assessment   Upper Extremity Assessment Upper Extremity Assessment: LUE deficits/detail LUE Deficits / Details: hemiparesis in L UE, pt able to squeeze hand and initate movements but limited by strength. Appears to use compensatory movements during testing, but grossly 2 to 2+/5 MMT. Sensation questionable. L neglect. LUE Sensation: decreased light touch;decreased proprioception LUE Coordination: decreased fine motor;decreased gross motor    Lower Extremity Assessment Lower Extremity Assessment: Generalized weakness;LLE deficits/detail LLE Deficits / Details: Appears to be bilaterally weak with increaed deficit on the L. MMT not Significantly revealing but noted more in standing with pre-gait activity.    Cervical / Trunk Assessment Cervical / Trunk Assessment: Other exceptions Cervical / Trunk Exceptions: R gaze preference. Pt able to fully turn head to the L but requiring increased time and cues  Communication   Communication: No difficulties   Cognition Arousal/Alertness: Awake/alert Behavior During Therapy: Flat affect, Anxious Overall Cognitive Status: Impaired/Different from baseline Area of Impairment: Attention, Safety/judgement, Following commands, Problem solving, Awareness                   Current Attention Level: Sustained   Following Commands: Follows one step commands with increased time, Follows multi-step commands inconsistently, Follows one step commands inconsistently Safety/Judgement: Decreased awareness of safety, Decreased awareness of deficits Awareness: Emergent Problem Solving: Slow processing, Decreased initiation, Difficulty sequencing, Requires verbal cues, Requires tactile cues General Comments: pt oriented to situation, place and month/year, she demonstrates slow processing and motor planning with mobility/ADL tasks, decreased awareness and sequencing with transfers requiring simple 1 step commands and multimodal cueing (appears to have more difficulty following commands as session progresses).  She is fearful OOB, presents with L sided neglect.        General Comments General comments (skin integrity, edema, etc.): pt reports 'not feeling right' but not feeling dizzy or lightheaded.  BP stable in recliner 150/71 (94).    Exercises     Assessment/Plan    PT Assessment Patient needs continued PT services  PT Problem List Decreased strength;Decreased balance;Decreased activity tolerance;Decreased mobility;Decreased coordination;Decreased range of motion;Decreased knowledge of use of DME;Decreased safety awareness;Decreased knowledge of precautions;Decreased cognition       PT Treatment Interventions DME instruction;Gait training;Functional mobility training;Therapeutic activities;Therapeutic exercise;Balance training;Neuromuscular re-education;Cognitive remediation;Patient/family education    PT Goals (Current goals can be found in the Care Plan  section)  Acute Rehab PT Goals Patient  Stated Goal: Feel better PT Goal Formulation: With patient Time For Goal Achievement: 08/01/21 Potential to Achieve Goals: Good    Frequency Min 4X/week     Co-evaluation PT/OT/SLP Co-Evaluation/Treatment: Yes Reason for Co-Treatment: Complexity of the patient's impairments (multi-system involvement);For patient/therapist safety;To address functional/ADL transfers PT goals addressed during session: Mobility/safety with mobility;Balance;Proper use of DME;Strengthening/ROM         AM-PAC PT "6 Clicks" Mobility  Outcome Measure Help needed turning from your back to your side while in a flat bed without using bedrails?: A Lot Help needed moving from lying on your back to sitting on the side of a flat bed without using bedrails?: A Lot Help needed moving to and from a bed to a chair (including a wheelchair)?: Total Help needed standing up from a chair using your arms (e.g., wheelchair or bedside chair)?: Total Help needed to walk in hospital room?: Total Help needed climbing 3-5 steps with a railing? : Total 6 Click Score: 8    End of Session Equipment Utilized During Treatment: Gait belt Activity Tolerance: Other (comment) (Limited by reports of "not feeling right") Patient left: in chair;with call bell/phone within reach;with chair alarm set Nurse Communication: Mobility status PT Visit Diagnosis: Unsteadiness on feet (R26.81);Difficulty in walking, not elsewhere classified (R26.2);Other symptoms and signs involving the nervous system (R29.898)    Time: 7353-2992 PT Time Calculation (min) (ACUTE ONLY): 27 min   Charges:   PT Evaluation $PT Eval Moderate Complexity: 1 Mod          Rolinda Roan, PT, DPT Acute Rehabilitation Services Secure Chat Preferred Office: [phone]   Thelma Comp 07/25/2021, 1:03 PM

## 2021-07-25 NOTE — Evaluation (Signed)
Speech Language Pathology Evaluation Patient Details Name: Wanda [name] MRN: [id number] DOB: 03-May-1940 Today's Date: 07/25/2021 Time: 5852-7782 SLP Time Calculation (min) (ACUTE ONLY): 22 min  Problem List:  Patient Active Problem List   Diagnosis Date Noted   Acute stroke due to ischemia ([place]) 07/24/2021   Carotid stenosis 11/13/2018   CVA (cerebral vascular accident) ([place]) 09/19/2018   Brainstem infarct, acute ([place])    Primary osteoarthritis of left knee 10/04/2015   Osteoporosis 08/23/2015   Multinodular goiter (nontoxic) 04/07/2014   Diabetes mellitus 10/09/2013   Insulin adverse reaction 09/27/2013   Senile nuclear sclerosis 08/14/2012   Essential hypertension 03/10/2011   Hyperlipidemia with target LDL less than 70 03/10/2011   Obesity (BMI 30.0-34.9) 03/10/2011   Past Medical History:  Past Medical History:  Diagnosis Date   Arthritis    Carotid stenosis 11/2018   Cataract    surgery to remove   Diabetes mellitus    type 2   Hyperlipidemia    Hypertension    Skin cancer    Removed from face   Stroke ([place]) 09/19/2018   Urgency of urination    Urinary leakage    Past Surgical History:  Past Surgical History:  Procedure Laterality Date   ABDOMINAL HYSTERECTOMY  early 80's   total   CAROTID ENDARTERECTOMY Left 11/13/2018   CARPAL TUNNEL RELEASE Bilateral 1980 and 1981   both hands   ENDARTERECTOMY Left 11/13/2018   Procedure: Left Carotid Artery Endarterectomy with Patch Angioplasty;  Surgeon: Rosetta Posner, MD;  Location: [facility] OR;  Service: Vascular;  Laterality: Left;   EYE SURGERY     bilateral cataracts   Fatty Tumor Excision     JOINT REPLACEMENT     TONSILLECTOMY     TONSILLECTOMY AND ADENOIDECTOMY  age 4   TOTAL KNEE ARTHROPLASTY Right 10/04/2015   TOTAL KNEE ARTHROPLASTY Right 10/04/2015   Procedure: TOTAL KNEE ARTHROPLASTY;  Surgeon: Dorna Leitz, MD;  Location: [place];  Service: Orthopedics;  Laterality: Right;   TOTAL KNEE ARTHROPLASTY Left  01/28/2016   Procedure: TOTAL KNEE ARTHROPLASTY;  Surgeon: Dorna Leitz, MD;  Location: [place];  Service: Orthopedics;  Laterality: Left;   TUBAL LIGATION     HPI:  Pt is a 81 y.o. female who presented after being found down and with L sided weakness. Daughter reports that patient has been forgetting to take her medications at times. CTA of the head and neck (7/16)  showed "Cytotoxic edema in the posterior Right MCA territory with no hemorrhage or mass effect. CTP does not detect infarct core,  suggesting it might be subacute". MRI pending. Seen by acute SLP 09/2018 for tx of dysphagia, with swallow function WFL noted via MBS (09/23/2018). PMH: HTN, DM2, h/o CVA s/p left endarterectomy of the carotid, arthritis.   Assessment / Plan / Recommendation Clinical Impression  Pt seen for brief evaluation of cognitive-linguistic function due to RN's report of imminent Ativan administration and MRI. She presents primarily with cognitive communication deficits with possible very mild dysarthria, which did not affect level of intelligibility. During evaluation, she provided full name and reason for hospitalization without difficulty. She was unable to provide year and current city and reported she had some difficulty with orientation to time occasionally at home. Immediate recall of listed items was 5/5, delayed recall 4/5 with 1/5 recalled given semantic cue. She also exhibited poor working memory during Engineer, materials task in addition to poor executive function/problem solving. L neglect noted without attempts to nagivate cross midline.  Receptive and expressive language appeared [facility] for informal tasks provided. Pt reports maybe come difference in cognitive function from baseline, but was unsure. No family to provide baseline function. Given clinical presentation, recommend SLP to f/u for treatment of cognitive functions during acute stay.    SLP Assessment  SLP Recommendation/Assessment: Patient needs  continued Speech [place] Pathology Services SLP Visit Diagnosis: Cognitive communication deficit (R41.841)    Recommendations for follow up therapy are one component of a multi-disciplinary discharge planning process, led by the attending physician.  Recommendations may be updated based on patient status, additional functional criteria and insurance authorization.    Follow Up Recommendations  Acute inpatient rehab (3hours/day)    Assistance Recommended at Discharge  Frequent or constant Supervision/Assistance  Functional Status Assessment Patient has had a recent decline in their functional status and demonstrates the ability to make significant improvements in function in a reasonable and predictable amount of time.  Frequency and Duration min 2x/week  2 weeks      SLP Evaluation Cognition  Overall Cognitive Status: Impaired/Different from baseline Arousal/Alertness: Awake/alert Orientation Level: Oriented to person;Oriented to situation;Disoriented to place;Disoriented to time Year: Other (Comment) ("something 23") Month: July Day of Week: Correct Attention: Sustained Sustained Attention: Appears intact Memory: Impaired Memory Impairment: Retrieval deficit;Decreased recall of new information Awareness: Impaired Awareness Impairment: Intellectual impairment Problem Solving: Impaired Problem Solving Impairment: Functional basic Executive Function: Organizing;Self Monitoring;Self Correcting Organizing: Impaired Organizing Impairment: Functional basic Self Monitoring: Impaired Self Monitoring Impairment: Functional basic Self Correcting: Impaired Self Correcting Impairment: Functional basic Safety/Judgment: Impaired       Comprehension  Auditory Comprehension Overall Auditory Comprehension: Appears within functional limits for tasks assessed Yes/No Questions: Within Functional Limits Commands: Within Functional Limits Conversation: Simple Visual  Recognition/Discrimination Discrimination: Not tested Reading Comprehension Reading Status: Not tested    Expression Expression Primary Mode of Expression: Verbal Verbal Expression Overall Verbal Expression: Appears within functional limits for tasks assessed Initiation: No impairment Automatic Speech: Name;Social Response;Day of week;Month of year Level of Generative/Spontaneous Verbalization: Conversation Naming: No impairment Written Expression Dominant Hand: Right Written Expression: Not tested   Oral / Motor  Oral Motor/Sensory Function Overall Oral Motor/Sensory Function: Mild impairment Facial ROM: Within Functional Limits Facial Symmetry: Abnormal symmetry left Motor Speech Overall Motor Speech: Appears within functional limits for tasks assessed              Ellwood Dense, [place], [place] Office Number: [phone]  Acie Fredrickson 07/25/2021, 11:51 AM

## 2021-07-25 NOTE — Progress Notes (Addendum)
Inpatient Diabetes Program Recommendations  AACE/ADA: New Consensus Statement on Inpatient Glycemic Control (2015)  Target Ranges:  Prepandial:   less than 140 mg/dL      Peak postprandial:   less than 180 mg/dL (1-2 hours)      Critically ill patients:  140 - 180 mg/dL   Lab Results  Component Value Date   GLUCAP 268 (H) 07/25/2021   HGBA1C 11.1 (H) 07/25/2021    Review of Glycemic Control  Latest Reference Range & Units 07/25/21 06:23 07/25/21 11:39  Glucose-Capillary 70 - 99 mg/dL 311 (H) 268 (H)   Diabetes history: DM 2 Outpatient Diabetes medications:  Glucotrol 5-10 mg daily, Lantus 35 units daily (ordered 20 units Lantus) Current orders for Inpatient glycemic control:  Novolog resistant tid with meals and HS Inpatient Diabetes Program Recommendations:    Please restart home basal insulin.  Consider adding Semglee 20 units daily.  A1C is greater than goal.  Per chart review, patient's A1C 05/04/21 was 11.8%.  Therefore A1C is slightly improved.     Thanks,  Adah Perl, RN, BC-ADM Inpatient Diabetes Coordinator Pager [phone]  (8a-5p)

## 2021-07-25 NOTE — Assessment & Plan Note (Signed)
TSH wnl .  .

## 2021-07-25 NOTE — Assessment & Plan Note (Signed)
Pain control

## 2021-07-25 NOTE — Progress Notes (Signed)
? ?  Inpatient Rehab Admissions Coordinator : ? ?Per therapy recommendations, patient was screened for CIR candidacy by Brigitt Mcclish RN MSN.  At this time patient appears to be a potential candidate for CIR. I will place a rehab consult per protocol for full assessment. Please call me with any questions. ? ?Mayan Kloepfer RN MSN ?Admissions Coordinator ?[phone] ?  ?

## 2021-07-26 ENCOUNTER — Inpatient Hospital Stay (HOSPITAL_COMMUNITY): Payer: Medicare Other

## 2021-07-26 DIAGNOSIS — I639 Cerebral infarction, unspecified: Secondary | ICD-10-CM

## 2021-07-26 DIAGNOSIS — E876 Hypokalemia: Secondary | ICD-10-CM | POA: Diagnosis not present

## 2021-07-26 DIAGNOSIS — I6521 Occlusion and stenosis of right carotid artery: Secondary | ICD-10-CM

## 2021-07-26 LAB — CBC WITH DIFFERENTIAL/PLATELET
Abs Immature Granulocytes: 0.04 10*3/uL (ref 0.00–0.07)
Basophils Absolute: 0.1 10*3/uL (ref 0.0–0.1)
Basophils Relative: 1 %
Eosinophils Absolute: 0 10*3/uL (ref 0.0–0.5)
Eosinophils Relative: 1 %
HCT: 39.3 % (ref 36.0–46.0)
Hemoglobin: 13.9 g/dL (ref 12.0–15.0)
Immature Granulocytes: 1 %
Lymphocytes Relative: 21 %
Lymphs Abs: 1.7 10*3/uL (ref 0.7–4.0)
MCH: 30.5 pg (ref 26.0–34.0)
MCHC: 35.4 g/dL (ref 30.0–36.0)
MCV: 86.4 fL (ref 80.0–100.0)
Monocytes Absolute: 0.8 10*3/uL (ref 0.1–1.0)
Monocytes Relative: 10 %
Neutro Abs: 5.4 10*3/uL (ref 1.7–7.7)
Neutrophils Relative %: 66 %
Platelets: 260 10*3/uL (ref 150–400)
RBC: 4.55 MIL/uL (ref 3.87–5.11)
RDW: 12.6 % (ref 11.5–15.5)
WBC: 8 10*3/uL (ref 4.0–10.5)
nRBC: 0 % (ref 0.0–0.2)

## 2021-07-26 LAB — BASIC METABOLIC PANEL
Anion gap: 13 (ref 5–15)
BUN: 15 mg/dL (ref 8–23)
CO2: 19 mmol/L — ABNORMAL LOW (ref 22–32)
Calcium: 9.7 mg/dL (ref 8.9–10.3)
Chloride: 106 mmol/L (ref 98–111)
Creatinine, Ser: 0.77 mg/dL (ref 0.44–1.00)
GFR, Estimated: >60 mL/min (ref >60)
Glucose, Bld: 248 mg/dL — ABNORMAL HIGH (ref 70–99)
Potassium: 2.9 mmol/L — ABNORMAL LOW (ref 3.5–5.1)
Sodium: 138 mmol/L (ref 135–145)

## 2021-07-26 LAB — GLUCOSE, CAPILLARY
Glucose-Capillary: 281 mg/dL — ABNORMAL HIGH (ref 70–99)
Glucose-Capillary: 230 mg/dL — ABNORMAL HIGH (ref 70–99)
Glucose-Capillary: 214 mg/dL — ABNORMAL HIGH (ref 70–99)
Glucose-Capillary: 221 mg/dL — ABNORMAL HIGH (ref 70–99)

## 2021-07-26 LAB — MAGNESIUM: Magnesium: 1.6 mg/dL — ABNORMAL LOW (ref 1.7–2.4)

## 2021-07-26 MED ORDER — INSULIN ASPART 100 UNIT/ML IJ SOLN
3.0000 [IU] | Freq: Three times a day (TID) | INTRAMUSCULAR | Status: DC
  Administered 2021-07-27 – 2021-07-28 (×6): 3 [IU] via SUBCUTANEOUS

## 2021-07-26 MED ORDER — LORAZEPAM 2 MG/ML IJ SOLN
INTRAMUSCULAR | Status: AC
  Filled 2021-07-26: qty 1

## 2021-07-26 MED ORDER — POTASSIUM CHLORIDE CRYS ER 20 MEQ PO TBCR
40.0000 meq | EXTENDED_RELEASE_TABLET | ORAL | Status: AC
  Administered 2021-07-26 (×3): 40 meq via ORAL
  Filled 2021-07-26 (×3): qty 2

## 2021-07-26 MED ORDER — LORAZEPAM 2 MG/ML IJ SOLN
1.0000 mg | INTRAMUSCULAR | Status: AC
  Administered 2021-07-26: 1 mg via INTRAVENOUS

## 2021-07-26 MED ORDER — MAGNESIUM SULFATE 2 GM/50ML IV SOLN
2.0000 g | Freq: Once | INTRAVENOUS | Status: AC
Start: 2021-07-26 — End: 2021-07-26
  Administered 2021-07-26: 2 g via INTRAVENOUS
  Filled 2021-07-26: qty 50

## 2021-07-26 NOTE — Assessment & Plan Note (Addendum)
Hypomagnesemia.   Renal function stable with serum cr at 0,83, K is 3,9 and serum bicarbonate at 19. Anion gap is 12.  Follow up renal function in am.

## 2021-07-26 NOTE — Assessment & Plan Note (Signed)
Replace and follow. ?

## 2021-07-26 NOTE — Progress Notes (Signed)
Physical Therapy Treatment Patient Details Name: Wanda [name] MRN: [id number] DOB: 08/31/1940 Today's Date: 07/26/2021   History of Present Illness Pt is an 81 y.o. female presenting to [facility] ED on 07/24/2021 with left-sided weakness and right gaze deviation. CT + subacute infarct in R temporal lobe. MRI pending. PMH includes: arthritis, carotid stenosis, DM2, HTN, urinary leakage, s/p L carotid endarterectomy, B TKA.    PT Comments    Pt progressing slowly towards physical therapy goals. Continues to have significant L side neglect, and in addition to mobility, session focused on attending to the L side, including LUE positioning, movement, and locking gaze on her L hand during tasks. Pt positioned in the chair facing towards the R with a show on TV she was interested in to encourage her to look L to watch the TV. Continue to feel AIR is the most appropriate d/c disposition at this time. Will continue to follow.    Recommendations for follow up therapy are one component of a multi-disciplinary discharge planning process, led by the attending physician.  Recommendations may be updated based on patient status, additional functional criteria and insurance authorization.  Follow Up Recommendations  Acute inpatient rehab (3hours/day)     Assistance Recommended at Discharge Frequent or constant Supervision/Assistance  Patient can return home with the following Two people to help with walking and/or transfers;Two people to help with bathing/dressing/bathroom;Assistance with cooking/housework;Assist for transportation;Direct supervision/assist for medications management   Equipment Recommendations  Other (comment) (TBD by next level of care)    Recommendations for Other Services       Precautions / Restrictions Precautions Precautions: Fall Precaution Comments: L neglect Restrictions Weight Bearing Restrictions: No     Mobility  Bed Mobility Overal bed mobility: Needs Assistance Bed  Mobility: Rolling, Sidelying to Sit Rolling: Mod assist, +2 for safety/equipment Sidelying to sit: Mod assist, +2 for physical assistance, +2 for safety/equipment, HOB elevated       General bed mobility comments: Assist for LE advancement towards EOB (LE more than RLE), Multimodal cues to reach for railing, and assist to elevate trunk to full sitting position    Transfers Overall transfer level: Needs assistance Equipment used: 2 person hand held assist Transfers: Sit to/from Stand, Bed to chair/wheelchair/BSC Sit to Stand: +2 physical assistance, +2 safety/equipment, Min assist   Step pivot transfers: Mod assist, +2 physical assistance, +2 safety/equipment       General transfer comment: pt stood from EOB with min assist +2, once upright took steps to recliner towards R side with mod assist +2 for weightshifting and advancing LLE more than RLE.    Ambulation/Gait             Pre-gait activities: Weight shifting, gaining midline posture including head positioning in standing. General Gait Details: Unable to progress to gait training at this time.   Stairs             Wheelchair Mobility    Modified Rankin (Stroke Patients Only) Modified Rankin (Stroke Patients Only) Pre-Morbid Rankin Score: No symptoms Modified Rankin: Severe disability     Balance Overall balance assessment: Needs assistance Sitting-balance support: Single extremity supported, No upper extremity supported, Feet supported Sitting balance-Leahy Scale: Fair Sitting balance - Comments: preference to R UE support, able to maintain balance without UE support.  at best min guard but dynamically but to min assist at times Postural control: Right lateral lean Standing balance support: Bilateral upper extremity supported, During functional activity, Single extremity supported Standing balance-Leahy Scale: Poor  Standing balance comment: relies on external and UE support                             Cognition Arousal/Alertness: Awake/alert Behavior During Therapy: Flat affect, Anxious Overall Cognitive Status: Impaired/Different from baseline Area of Impairment: Attention, Safety/judgement, Following commands, Problem solving, Awareness                   Current Attention Level: Sustained   Following Commands: Follows one step commands with increased time, Follows multi-step commands inconsistently, Follows one step commands inconsistently Safety/Judgement: Decreased awareness of safety, Decreased awareness of deficits Awareness: Emergent Problem Solving: Slow processing, Decreased initiation, Difficulty sequencing, Requires verbal cues, Requires tactile cues          Exercises      General Comments General comments (skin integrity, edema, etc.): Noted that pt ID bracelet cut pt's wrist. foam placed over area and RN notified.      Pertinent Vitals/Pain Pain Assessment Pain Assessment: Faces Faces Pain Scale: No hurt    Home Living Family/patient expects to be discharged to:: Private residence Living Arrangements: Alone Available Help at Discharge: Family;Available PRN/intermittently Type of Home: Apartment Home Access: Level entry       Home Layout: One level Home Equipment: Conservation officer, nature (2 wheels);Cane - single point;Crutches      Prior Function            PT Goals (current goals can now be found in the care plan section) Acute Rehab PT Goals Patient Stated Goal: Feel better PT Goal Formulation: With patient Time For Goal Achievement: 08/01/21 Potential to Achieve Goals: Good Progress towards PT goals: Progressing toward goals    Frequency    Min 4X/week      PT Plan Current plan remains appropriate    Co-evaluation              AM-PAC PT "6 Clicks" Mobility   Outcome Measure  Help needed turning from your back to your side while in a flat bed without using bedrails?: A Lot Help needed moving from lying on your back  to sitting on the side of a flat bed without using bedrails?: A Lot Help needed moving to and from a bed to a chair (including a wheelchair)?: Total Help needed standing up from a chair using your arms (e.g., wheelchair or bedside chair)?: Total Help needed to walk in hospital room?: Total Help needed climbing 3-5 steps with a railing? : Total 6 Click Score: 8    End of Session Equipment Utilized During Treatment: Gait belt Activity Tolerance: Patient tolerated treatment well Patient left: in chair;with call bell/phone within reach;with chair alarm set Nurse Communication: Mobility status PT Visit Diagnosis: Unsteadiness on feet (R26.81);Difficulty in walking, not elsewhere classified (R26.2);Other symptoms and signs involving the nervous system (R29.898)     Time: 5537-4827 PT Time Calculation (min) (ACUTE ONLY): 28 min  Charges:  $Gait Training: 23-37 mins                     Rolinda Roan, PT, DPT Acute Rehabilitation Services Secure Chat Preferred Office: [phone]    Thelma Comp 07/26/2021, 3:34 PM

## 2021-07-26 NOTE — Consult Note (Addendum)
ASSESSMENT & PLAN   SYMPTOMATIC RIGHT CAROTID STENOSIS: This patient has a 60 to 79% right carotid stenosis by duplex and extensive calcific plaque on CT angio of the neck.  The left carotid endarterectomy site is widely patent.  The calcific plaque on the right extends very high and I do not think this is surgically accessible.  Likewise, she does not appear to be a good candidate for TCAR as the common carotid artery length and depth do not meet the inclusion criteria.  In addition she is allergic to aspirin and statins and therefore is not a candidate for a TCAR.  I would agree with maximal medical therapy including Plavix and alternative management of her cholesterol.  The only other consideration would be to have interventional radiology evaluate her for possible transfemoral carotid stenting.  REASON FOR CONSULT:    Symptomatic right carotid stenosis.  HPI:   Wanda [name] is a 81 y.o. female who presented to the emergency department on 07/24/2021 with left-sided weakness.  CT scan showed a subacute infarct in the right temporal lobe.  On my history, the patient developed a sudden onset of left upper extremity weakness 2 days ago.  This weakness has persisted and according to her has not improved any.  She denies any lower extremity weakness.  She denies any expressive or receptive aphasia.  She did have a stroke back in 2020 and at that time underwent a left carotid endarterectomy by Dr. Donnetta Hutching on 11/13/2018.  Risk factors for peripheral arterial disease include type 2 diabetes, hypertension, and hypercholesterolemia.  She denies any family history of premature cardiovascular disease.  She is not a smoker.  She denies any history of claudication or rest pain.  She is allergic to aspirin.  She is allergic to statins.  Past Medical History:  Diagnosis Date   Arthritis    Carotid stenosis 11/2018   Cataract    surgery to remove   Diabetes mellitus    type 2   Hyperlipidemia     Hypertension    Skin cancer    Removed from face   Stroke [facility]) 09/19/2018   Urgency of urination    Urinary leakage     Family History  Problem Relation Age of Onset   Cancer Mother        liver   Breast cancer Mother 74   Pancreatitis Father        deceased 26   Cancer Brother        GI   Coronary artery disease Paternal Grandmother    Cancer Son        terminal kidney   Breast cancer Maternal Aunt    Cancer Other    Coronary artery disease Paternal Uncle     SOCIAL HISTORY: Social History   Tobacco Use   Smoking status: Never   Smokeless tobacco: Never  Substance Use Topics   Alcohol use: Yes    Alcohol/week: 1.0 standard drink of alcohol    Types: 1 Standard drinks or equivalent per week    Comment: maybe once per month - 3 drinks    Allergies  Allergen Reactions   Ace Inhibitors Diarrhea, Swelling, Other (See Comments) and Cough    Pt had cough and diarrhea with first few doses of medication; also had swelling of right eyelid. Stopped medication on 03/17/11.   Alendronate Sodium Other (See Comments)    "Made my whole body hurt"   Aspirin Hives   Crestor [Rosuvastatin] Other (See Comments)    "  Made my whole body hurt"   Lipitor [Atorvastatin Calcium] Other (See Comments)    "Made my whole body hurt"   Metformin And Related     Severe abdominal pain   Shellfish Allergy Nausea Only    Intolerant of fresh shellfish, reports the reaction is GI upset, denies hives, denies any swelling  Reports that she can tolerate canned seafood.     Current Facility-Administered Medications  Medication Dose Route Frequency Provider Last Rate Last Admin   0.9 %  sodium chloride infusion   Intravenous Continuous Sid Falcon, MD 50 mL/hr at 07/26/21 1403 Infusion Verify at 07/26/21 1403   acetaminophen (TYLENOL) tablet 650 mg  650 mg Oral Q4H PRN Sid Falcon, MD       Or   acetaminophen (TYLENOL) 160 MG/5ML solution 650 mg  650 mg Per Tube Q4H PRN Sid Falcon, MD        Or   acetaminophen (TYLENOL) suppository 650 mg  650 mg Rectal Q4H PRN Sid Falcon, MD       clopidogrel (PLAVIX) tablet 75 mg  75 mg Oral Daily Gilles Chiquito B, MD   75 mg at 07/26/21 0941   heparin injection 5,000 Units  5,000 Units Subcutaneous Q8H Gilles Chiquito B, MD   5,000 Units at 07/26/21 1404   insulin aspart (novoLOG) injection 0-20 Units  0-20 Units Subcutaneous TID WC Gilles Chiquito B, MD   7 Units at 07/26/21 1222   insulin aspart (novoLOG) injection 0-5 Units  0-5 Units Subcutaneous QHS Gilles Chiquito B, MD   5 Units at 07/25/21 2155   insulin glargine-yfgn (SEMGLEE) injection 20 Units  20 Units Subcutaneous Daily Elodia Florence., MD   20 Units at 07/26/21 0941   potassium chloride SA (KLOR-CON M) CR tablet 40 mEq  40 mEq Oral Q4H Elodia Florence., MD   40 mEq at 07/26/21 1222   senna-docusate (Senokot-S) tablet 1 tablet  1 tablet Oral QHS PRN Sid Falcon, MD        REVIEW OF SYSTEMS:  '[X]'$  denotes positive finding, '[ ]'$  denotes negative finding Cardiac  Comments:  Chest pain or chest pressure:    Shortness of breath upon exertion: x   Short of breath when lying flat:    Irregular heart rhythm:        Vascular    Pain in calf, thigh, or hip brought on by ambulation:    Pain in feet at night that wakes you up from your sleep:     Blood clot in your veins:    Leg swelling:         Pulmonary    Oxygen at home:    Productive cough:     Wheezing:         Neurologic    Sudden weakness in arms or legs:  x Left arm  Sudden numbness in arms or legs:     Sudden onset of difficulty speaking or slurred speech:    Temporary loss of vision in one eye:     Problems with dizziness:         Gastrointestinal    Blood in stool:     Vomited blood:         Genitourinary    Burning when urinating:     Blood in urine:        Psychiatric    Major depression:         Hematologic    Bleeding problems:  Problems with blood clotting too easily:         Skin    Rashes or ulcers:        Constitutional    Fever or chills:    -  PHYSICAL EXAM:   Vitals:   07/26/21 0403 07/26/21 0733 07/26/21 1146 07/26/21 1614  BP: (!) 176/79 (!) 159/73 (!) 144/76 (!) 167/82  Pulse: 95 97 96 98  Resp: '16 18 15 19  '$ Temp: 97.7 F (36.5 C) 97.6 F (36.4 C) 97.8 F (36.6 C) 98.3 F (36.8 C)  TempSrc: Oral Oral Oral Oral  SpO2: 97% 99% 97% 95%  Weight:      Height:       Body mass index is 32.49 kg/m.  GENERAL: The patient is a well-nourished female, in no acute distress. The vital signs are documented above. CARDIAC: There is a regular rate and rhythm.  VASCULAR: She has a right carotid bruit. On the right side she has a palpable femoral and popliteal pulse.  She has a biphasic dorsalis pedis and posterior tibial signal with the Doppler. On the left side she has a palpable femoral, popliteal, and dorsalis pedis pulse.  She has a biphasic dorsalis pedis and posterior tibial signal with the Doppler. PULMONARY: There is good air exchange bilaterally without wheezing or rales. ABDOMEN: Soft and non-tender with normal pitched bowel sounds.  I do not palpate an aneurysm although it is difficult to assess because of her size. MUSCULOSKELETAL: There are no major deformities. NEUROLOGIC: She has no expressive aphasia.  She has significant left upper extremity weakness.  I do not detect any significant lower extremity weakness. SKIN: There are no ulcers or rashes noted. PSYCHIATRIC: The patient has a normal affect.  DATA:    CAROTID DUPLEX: I have reviewed the carotid duplex scan that was done today.  On the right side, based on the end-diastolic velocity of 45 cm/s, this would be a 40 to 59% stenosis.  However the peak systolic velocity of 258 cm/s would suggest a 60 to 79% stenosis.  Of note the right vertebral artery is patent with antegrade flow.  On the left side, there was some mildly elevated velocities in the distal left common carotid artery  but no significant stenosis on the left.  The left vertebral artery is patent with antegrade flow  CT ANGIO HEAD AND NECK: I have reviewed the images of the CT angio of the head and neck.  She was noted to have edema in the posterior right MCA territory with no hemorrhagic or mass effect.  He was noted to have evidence of a right carotid stenosis.  Of note, because of motion artifact the images are less than ideal.  MRI BRAIN: Her MRI of the brain shows multiple evolving acute to subacute infarcts in both cerebral hemispheres (right more than left).  The largest infarct is in the right parietal lobe and the MCA distribution.  There is no hemorrhagic transformation.  LABS: Her creatinine is 0.77.  Her GFR is greater than 60.  LDL cholesterol is 134.  Total cholesterol 214.  Her hemoglobin is 13.9 with a platelet count of 260,000.   Deitra Mayo [facility]

## 2021-07-26 NOTE — Progress Notes (Signed)
   Inpatient Rehabilitation Admissions Coordinator   Met with patient at bedside for rehab assessment and spoke with her daughter , [name], by phone. We discussed goals and expectations of a possible CIR admit. Prior to admit patient lived alone. [name] to discuss with her Mom and family what caregiver supports they can provide to determining if CIR VS SNF rehab venue preference.  Please call me with any questions.   Danne Baxter, RN, MSN Rehab Admissions Coordinator [phone]

## 2021-07-26 NOTE — Progress Notes (Signed)
Bilateral carotid duplex study completed. Please see CV Proc for preliminary results.  Anderson Malta  Egidio Lofgren BS, RVT 07/26/2021 12:48 PM

## 2021-07-26 NOTE — Progress Notes (Signed)
STROKE TEAM PROGRESS NOTE   INTERVAL HISTORY Patient is lying in bed.  She is having carotid ultrasound at the bedside.  She has no new complaints.  Vital signs stable.  Neurological exam is unchanged.  Carotid ultrasound shows elevated peak systolic velocities in the right ICA but diastolic velocities were not elevated but excessively calcified plaque so stenosis may be underestimated Vitals:   07/26/21 0020 07/26/21 0403 07/26/21 0733 07/26/21 1146  BP: (!) 169/83 (!) 176/79 (!) 159/73 (!) 144/76  Pulse: 96 95 97 96  Resp: '15 16 18 15  '$ Temp: 98.5 F (36.9 C) 97.7 F (36.5 C) 97.6 F (36.4 C) 97.8 F (36.6 C)  TempSrc: Oral Oral Oral Oral  SpO2: 98% 97% 99% 97%  Weight:      Height:       CBC:  Recent Labs  Lab 07/24/21 1122 07/24/21 1126 07/26/21 0834  WBC 12.5*  --  8.0  NEUTROABS 9.9*  --  5.4  HGB 14.4 13.9 13.9  HCT 39.7 41.0 39.3  MCV 84.8  --  86.4  PLT 269  --  409   Basic Metabolic Panel:  Recent Labs  Lab 07/24/21 1122 07/24/21 1126 07/26/21 0834  NA 137 137 138  K 3.5 3.4* 2.9*  CL 101 102 106  CO2 18*  --  19*  GLUCOSE 369* 384* 248*  BUN 23 25* 15  CREATININE 1.23* 0.90 0.77  CALCIUM 10.0  --  9.7  MG  --   --  1.6*   Lipid Panel:  Recent Labs  Lab 07/25/21 0441  CHOL 214*  TRIG 267*  HDL 27*  CHOLHDL 7.9  VLDL 53*  LDLCALC 134*   HgbA1c:  Recent Labs  Lab 07/25/21 0441  HGBA1C 11.1*   Urine Drug Screen: No results for input(s): "LABOPIA", "COCAINSCRNUR", "LABBENZ", "AMPHETMU", "THCU", "LABBARB" in the last 168 hours.  Alcohol Level  Recent Labs  Lab 07/24/21 1122  ETH <10    IMAGING past 24 hours VAS US CAROTID  Result Date: 07/26/2021 Carotid Arterial Duplex Study Patient Name:  Wanda [name]  Date of Exam:   07/26/2021 Medical Rec #: [id number]            Accession #:    [id number] Date of Birth: 1940-02-10             Patient Gender: F Patient Age:   81 years Exam Location:  [facility] Procedure:      VAS US  CAROTID Referring Phys: Ericberto Padget --------------------------------------------------------------------------------  Indications:   CVA. Risk Factors:  Hypertension, hyperlipidemia, Diabetes, prior CVA. Other Factors: S/p Left carotid endartectomy. Performing Technologist: Bobetta Lime BS, RVT  Examination Guidelines: A complete evaluation includes B-mode imaging, spectral Doppler, color Doppler, and power Doppler as needed of all accessible portions of each vessel. Bilateral testing is considered an integral part of a complete examination. Limited examinations for reoccurring indications may be performed as noted.  Right Carotid Findings: +----------+--------+--------+--------+------------------+------------------+           PSV cm/sEDV cm/sStenosisPlaque DescriptionComments           +----------+--------+--------+--------+------------------+------------------+ CCA Prox  47      13                                                   +----------+--------+--------+--------+------------------+------------------+ CCA Distal51  12                                intimal thickening +----------+--------+--------+--------+------------------+------------------+ ICA Prox  383     45      40-59%  calcific                             +----------+--------+--------+--------+------------------+------------------+ ICA Distal154     18                                                   +----------+--------+--------+--------+------------------+------------------+ ECA       522     38              calcific                             +----------+--------+--------+--------+------------------+------------------+ +----------+--------+-------+----------------+-------------------+           PSV cm/sEDV cmsDescribe        Arm Pressure (mmHG) +----------+--------+-------+----------------+-------------------+ Subclavian218            Multiphasic, WNL                     +----------+--------+-------+----------------+-------------------+ +---------+--------+--+--------+--+---------+ VertebralPSV cm/s87EDV cm/s18Antegrade +---------+--------+--+--------+--+---------+  Left Carotid Findings: +----------+--------+--------+--------+------------------+--------+           PSV cm/sEDV cm/sStenosisPlaque DescriptionComments +----------+--------+--------+--------+------------------+--------+ CCA Prox  108     13                                         +----------+--------+--------+--------+------------------+--------+ CCA Distal134     20      <50%    calcific                   +----------+--------+--------+--------+------------------+--------+ ICA Prox  71      9                                          +----------+--------+--------+--------+------------------+--------+ ICA Distal81      19                                         +----------+--------+--------+--------+------------------+--------+ ECA       137     15                                         +----------+--------+--------+--------+------------------+--------+ +----------+--------+--------+----------------+-------------------+           PSV cm/sEDV cm/sDescribe        Arm Pressure (mmHG) +----------+--------+--------+----------------+-------------------+ [id number]             Multiphasic, WNL                    +----------+--------+--------+----------------+-------------------+ +---------+--------+--+--------+--+---------+ VertebralPSV cm/s87EDV cm/s25Antegrade +---------+--------+--+--------+--+---------+ Left CEA site noted.  Summary: Right Carotid: Velocities in the right ICA are  consistent with a 40-59%                stenosis. The ECA appears >50% stenosed. Calcific shadowing may                have prohibited higher velocities to be obtained. Left Carotid: Velocities in the left ICA are consistent with a 1-39% stenosis.                Non-hemodynamically significant plaque <50% noted in the CCA. Left               CEA site noted. Vertebrals:  Bilateral vertebral arteries demonstrate antegrade flow. Subclavians: Normal flow hemodynamics were seen in bilateral subclavian              arteries. *See table(s) above for measurements and observations.     Preliminary    MR BRAIN WO CONTRAST  Result Date: 07/25/2021 CLINICAL DATA:  Stroke follow-up. EXAM: MRI HEAD WITHOUT CONTRAST TECHNIQUE: Multiplanar, multiecho pulse sequences of the brain and surrounding structures were obtained without intravenous contrast. COMPARISON:  CT/CTA head and neck dated 1 day prior FINDINGS: Image quality is degraded by motion artifact particularly involving the axial SWI and axial T2 sequences. Brain: There is moderate-sized region of diffusion restriction and associated FLAIR signal abnormality centered in the right parietal lobe in the MCA distribution consistent with evolving acute to subacute infarct. There are numerous additional patchy foci of diffusion restriction and FLAIR signal abnormality throughout both cerebral hemispheres involving the bilateral frontal lobes and right temporal lobe consistent with additional smaller acute to subacute infarcts. The SWI sequence is significantly motion degraded, but there is no definite hemorrhagic transformation. There is no definite acute intracranial hemorrhage or extra-axial fluid collection. Background parenchymal volume is normal for age. The ventricles are stable in size. There are a few small remote lacunar infarcts in the bilateral corona radiata/centrum semiovale and background advanced chronic white matter microangiopathy. There is no solid mass lesion.  There is no midline shift. Vascular: Normal flow voids. Skull and upper cervical spine: Normal marrow signal. Sinuses/Orbits: The paranasal sinuses are clear. Bilateral lens implants are in place. The globes and orbits are otherwise unremarkable. Other: None.  IMPRESSION: 1. Image quality is significantly degraded by motion artifact. 2. Multiple evolving acute to subacute infarcts in both cerebral hemispheres (right more than left) as above with the largest infarct in the right parietal lobe in the MCA distribution corresponding to the finding on the prior noncontrast CT head. No definite hemorrhagic transformation. Electronically Signed   By: Valetta Mole M.D.   On: 07/25/2021 17:00    PHYSICAL EXAM GENERAL: Pleasant elderly Caucasian lady awake, alert, in no acute distress Psych: Affect appropriate for situation, patient is calm and cooperative with examination Head: Normocephalic and atraumatic, without obvious abnormality LUNGS: Normal respiratory effort. Non-labored breathing on room air Extremities: warm, well perfused, without obvious deformity   NEURO:  Mental Status:  Speech/Language: speech is slightly dysarthric but fluent.   Naming, repetition, fluency, and comprehension intact without aphasia  No neglect is noted Cranial Nerves:  II: PERRL Left visual field cut  III, IV, VI: EOMI. Lid elevation symmetric and full.  V: Sensation is intact to light touch and symmetrical to face.  VII: Face is symmetric resting and smiling.  VIII: Hearing intact to voice XII: Tongue protrudes midline without fasciculations.   Motor: 5/5 strength to RUE and RLE, 4/5 in LUE and LLE.  Weakness of left grip.  Diminished  fine finger movements on the left.  Orbits right over left upper extremity. Tone is normal. Bulk is normal.  Sensation: Intact to light touch bilaterally in all four extremities. Extinction present on left side Gait: Deferred ASSESSMENT/PLAN Wanda [name] is a 81 y.o. 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 She was last seen well by her daughter at 24 on 7/15  and was found down on the morning of 7/16  by her son with left sided weakness and right gaze deviation. Upon arrival to ED CT head showed loss of grey-white differentiation in the posterior right parietal lobe. Taken for CT angiography head and neck and perfusion studies.  No identifiable proximal intracranial large vessel occlusion.  Significant stenosis in the right ICA origin was suspected to likely be the culprit for her symptoms.Outside the window for IV thrombolysis. No emergent LVO for emergent thrombectomy.   She reportedly stopped taking her plavix in a self directed fashion 2 weeks prior to this event.   Subacute infarct of the right temporal lobe likely symptomatic from high-grade proximal right ICA stenosis   with stroke work up ongoing  CT-scan of the brain: cytotoxic edema in right parietal lobe, ASPECTS 8  CTA/CTP: suspicion of high grade right ICA origin stenosis with possible radiographic string sign, postsurgical changes in left carotid, stenotic bilateral vertebral arteries, no infarct core and <11 mL penumbra  MRI Motion degraded study but multiple bilateral right greater than left MCA branch infarcts in parietal lobe  I will carotid Doppler calcified plaque at the right carotid bifurcation with 50 to 69% stenosis with velocities may be underestimated 2D Echo EF 75%.   LDL 134 HgbA1c 11.1 VTE prophylaxis -     Diet   Diet heart healthy/carb modified Room service appropriate? Yes; Fluid consistency: Thin  Plavix prescribed prior to admission but patient was not taking has a reported ASA allergy Recommend plavix alone daily at present  Continue telemetry monitoring Therapy recommendations:  IPR Disposition:  TBD  Hypertension Stable Permissive hypertension (OK if < 220/120) but gradually normalize in 5-7 days Long-term BP goal normotensive  Hyperlipidemia No home  meds  LDL 134, goal < 70 High intensity statin is indicated with multiple reported statin intolerances. Please refer to lipid clinic.   Diabetes type II severely uncontrolled HgbA1c 11.1, goal < 7.0 CBGs Recent Labs    07/25/21 2122 07/26/21 0610 07/26/21 1143  GLUCAP 370* 281* 230*    [facility] PCP follow up to manage and improve diabetes control  Other Stroke Risk Factors Advanced Age >/= 38  Current ETOH use, alcohol level <10, advised to drink no more than one drink a day Obesity, Body mass index is 32.49 kg/m., BMI >/= 30 associated with increased stroke risk, recommend weight loss, diet and exercise as appropriate  Hx stroke High risk for Obstructive sleep apnea  Hospital day # 2   Patient presented to the left hemiparesis and brain imaging shows right MCA infarct with symptomatic high-grade proximal right carotid stenosis.  Recommend vascular surgery consult for elective right carotid revascularization.  Continue Plavix alone for stroke prevention as patient has aspirin allergy. .  Physical occupational and speech therapy consults.  Discussed with Dr. Florene Glen.  Follow-up as outpatient stroke clinic in 2 months.  Stroke team will sign off.  Can call for questions.  Greater than 50% time during this 35 minute visit was spent on counseling and coordination of care about stroke and symptomatic carotid stenosis and discussion about plans for possible carotid revascularization and answering questions.  Antony Contras, MD Medical Director [facility] Pager: [phone] 07/26/2021 1:30 PM  To contact Stroke Continuity provider, please refer to [email or web address] After hours, contact General Neurology

## 2021-07-26 NOTE — Progress Notes (Addendum)
Inpatient Diabetes Program Recommendations  AACE/ADA: New Consensus Statement on Inpatient Glycemic Control (2015)  Target Ranges:  Prepandial:   less than 140 mg/dL      Peak postprandial:   less than 180 mg/dL (1-2 hours)      Critically ill patients:  140 - 180 mg/dL   Lab Results  Component Value Date   GLUCAP 281 (H) 07/26/2021   HGBA1C 11.1 (H) 07/25/2021    Review of Glycemic Control  Latest Reference Range & Units 07/25/21 06:23 07/25/21 11:39 07/25/21 17:43 07/25/21 21:22 07/26/21 06:10  Glucose-Capillary 70 - 99 mg/dL 311 (H) 268 (H) 281 (H) 370 (H) 281 (H)   Diabetes history: DM 2 Outpatient Diabetes medications:  Glucotrol 5-10 mg daily, Lantus 35 units daily (ordered 20 units Lantus) Current orders for Inpatient glycemic control:  Novolog 0-20 tid + HS Semglee 20 units Daily  Inpatient Diabetes Program Recommendations:    -  Consider increasing Semglee to 28 units  Spoke with pt at bedside regarding A1c level of 11.1% and glucose control at home. Pt last saw her PCP 3 months ago. No changes were made to her medications. Pt drinks water at home but she does say she probably eats too much. Discussed lifestyle modifications with diet. Discussed the need for glucose control. Encouraged follow up with PCP. Pt reports she will schedule an appointment as soon as she is discharged.   Thanks,  Tama Headings RN, MSN, BC-ADM Inpatient Diabetes Coordinator Team Pager [phone] (8a-5p)

## 2021-07-26 NOTE — Plan of Care (Signed)

## 2021-07-26 NOTE — Progress Notes (Addendum)
PROGRESS NOTE    Wanda [name]  PTW:[id number] DOB: 09/28/1940 DOA: 07/24/2021 PCP: Darreld Mclean, MD  Chief Complaint  Patient presents with   Code Stroke    Brief Narrative:  Wanda [name] is Wanda [name] 81 y.o. female with medical history significant of HTN, DM2, h/o CVA s/p left endarterectomy of the carotid, arthritis who presented after being found down.  Her daughter is present and adds to the history.  Patient was noted to be well at 5pm on Saturday the day PTA.  Next seen the following morning down and with left sided weakness.  The patient reports attempting to get up in the morning and falling against her dresser and then being found by her daughter.  She does not know if she felt weak at that time.  Daughter reports that patient has been forgetting to take her medications at time.  She is attempting to help her mother.  About 2 weeks ago, the patient decided to stop taking her plavix because she was not sure what it was doing for her.  She also has not been able to tolerate statins in the past.  She has Roper Tolson FH of CAD on her father's side.  She denies urinary symptoms, blood loss, chest pain or cough.    She was admitted and found to have [facility] stroke.  Seen by neurology.  Vascular surgery c/s pending for right carotid artery stenosis.  Ultimate plan for inpatient rehab.    Assessment & Plan:   Principal Problem:   Acute stroke due to ischemia [facility]) Active Problems:   Cytotoxic brain edema (HCC)   Essential hypertension   Fever   Hyperlipidemia with target LDL less than 70   Diabetes mellitus   Multinodular goiter (nontoxic)   Primary osteoarthritis of left knee   Hypomagnesemia   Hypokalemia   Obesity (BMI 30-39.9)   Assessment and Plan: * Acute stroke due to ischemia [facility]) MRI motion degraded, multiple evovling acute to subacute infarcts in both cerebral hemispheres (see report), with largest infarct in R parietal lobe in MCA distribution CTA head/neck with cytotoxic  edmea in posterior right MCA territory, no right MCA branch occlusion identified, suspicion of high grade right ICA origin stenosis (probably radiographic string sign), right siphon calcified plaque, but only mild stenosis, evidnece of previous L carotid endarterectomy, mild to moderate L siphon stenosis, atherosclerotic and highly stenotic bilateral vertebral arteries (L is dominant), mild bilateral Pca origin and moderate L PCA P2 stenoses Negative covid, TSH wnl a1c 11.1, LDL 134 Echo with EF >75%.  No intracardiac source of embolism detected. Carotid US with 60-70% stenosis on R, ECA >50% stenosis, calcific shadowing may have prohibited higher velocities to be obtained.  (L ICA c/w 1-39% stenosis, non hemodynamically significant plaque <50% noted in CCA.  L CEA site noted). Appreciate neurology recs -> stroke team recommending vascular surgery consult (discussed with Dr. Scot Dock today).  Currently plavix only due to aspirin allergy, needs lipid clinic referral (statin intolerance).  Needs 2 month neuro follow up with stroke clinic.    Fever Negative covid UA not c/w UTI CXR without acute cardiopulm disease Resolved, follow  Hyperlipidemia with target LDL less than 70 LDL 134 Statin intolerance Needs outpatient follow up with lipid clinic   Diabetes mellitus Hold glipizide Resume lantus, continue SSI - will add mealtime  Multinodular goiter (nontoxic) TSH wnl  Primary osteoarthritis of left knee Pain control   Hypokalemia Replace and follow  Hypomagnesemia Replace and follow  Obesity (BMI  30-39.9) noted     DVT prophylaxis: heparin Code Status: full Family Communication: called daughter, line went straight to voicemail 7/18 Disposition:   Status is: Inpatient Remains inpatient appropriate because: need for continued workup/treatment   Consultants:  neurology  Procedures:  pending  Antimicrobials:  Anti-infectives (From admission, onward)    None        Subjective: No new complaints  Objective: Vitals:   07/26/21 0403 07/26/21 0733 07/26/21 1146 07/26/21 1614  BP: (!) 176/79 (!) 159/73 (!) 144/76 (!) 167/82  Pulse: 95 97 96 98  Resp: '16 18 15 19  '$ Temp: 97.7 F (36.5 C) 97.6 F (36.4 C) 97.8 F (36.6 C) 98.3 F (36.8 C)  TempSrc: Oral Oral Oral Oral  SpO2: 97% 99% 97% 95%  Weight:      Height:        Intake/Output Summary (Last 24 hours) at 07/26/2021 1711 Last data filed at 07/26/2021 1420 Gross per 24 hour  Intake 1800.13 ml  Output 600 ml  Net 1200.13 ml   Filed Weights   07/24/21 1132 07/24/21 1700  Weight: 80.6 kg 78 kg    Examination:  General: No acute distress. Cardiovascular: RRR Lungs: unlabored Neurological: L sided weakness Extremities: No clubbing or cyanosis. No edema  Data Reviewed: I have personally reviewed following labs and imaging studies  CBC: Recent Labs  Lab 07/24/21 1122 07/24/21 1126 07/26/21 0834  WBC 12.5*  --  8.0  NEUTROABS 9.9*  --  5.4  HGB 14.4 13.9 13.9  HCT 39.7 41.0 39.3  MCV 84.8  --  86.4  PLT 269  --  254    Basic Metabolic Panel: Recent Labs  Lab 07/24/21 1122 07/24/21 1126 07/26/21 0834  NA 137 137 138  K 3.5 3.4* 2.9*  CL 101 102 106  CO2 18*  --  19*  GLUCOSE 369* 384* 248*  BUN 23 25* 15  CREATININE 1.23* 0.90 0.77  CALCIUM 10.0  --  9.7  MG  --   --  1.6*    GFR: Estimated Creatinine Clearance: 52.2 mL/min (by C-G formula based on SCr of 0.77 mg/dL).  Liver Function Tests: Recent Labs  Lab 07/24/21 1122  AST 26  ALT 22  ALKPHOS 45  BILITOT 1.1  PROT 7.8  ALBUMIN 4.3    CBG: Recent Labs  Lab 07/25/21 1139 07/25/21 1743 07/25/21 2122 07/26/21 0610 07/26/21 1143  GLUCAP 268* 281* 370* 281* 230*     Recent Results (from the past 240 hour(s))  SARS Coronavirus 2 by RT PCR (hospital order, performed in [facility] hospital lab) *cepheid single result test* Anterior Nasal Swab     Status: None   Collection Time: 07/25/21  10:15 AM   Specimen: Anterior Nasal Swab  Result Value Ref Range Status   SARS Coronavirus 2 by RT PCR NEGATIVE NEGATIVE Final    Comment: (NOTE) SARS-CoV-2 target nucleic acids are NOT DETECTED.  The SARS-CoV-2 RNA is generally detectable in upper and lower respiratory specimens during the acute phase of infection. The lowest concentration of SARS-CoV-2 viral copies this assay can detect is 250 copies / mL. Andreia Gandolfi negative result does not preclude SARS-CoV-2 infection and should not be used as the sole basis for treatment or other patient management decisions.  Lealand Elting negative result may occur with improper specimen collection / handling, submission of specimen other than nasopharyngeal swab, presence of viral mutation(s) within the areas targeted by this assay, and inadequate number of viral copies (<250 copies / mL).  Laurelle Skiver negative result must be combined with clinical observations, patient history, and epidemiological information.  Fact Sheet for Patients:   https://www.patel.info/  Fact Sheet for Healthcare Providers: https://hall.com/  This test is not yet approved or  cleared by the [place] FDA and has been authorized for detection and/or diagnosis of SARS-CoV-2 by FDA under an Emergency Use Authorization (EUA).  This EUA will remain in effect (meaning this test can be used) for the duration of the COVID-19 declaration under Section 564(b)(1) of the Act, 21 U.S.C. section 360bbb-3(b)(1), unless the authorization is terminated or revoked sooner.  Performed at [facility], [place] [address], [place], [place] [address]          Radiology Studies: VAS US CAROTID  Result Date: 07/26/2021 Carotid Arterial Duplex Study Patient Name:  Wanda [name]  Date of Exam:   07/26/2021 Medical Rec #: [id number]            Accession #:    [id number] Date of Birth: 05/11/1940             Patient Gender: F Patient Age:   89 years Exam Location:   [facility] Procedure:      VAS US CAROTID Referring Phys: PRAMOD SETHI --------------------------------------------------------------------------------  Indications:   CVA. Risk Factors:  Hypertension, hyperlipidemia, Diabetes, prior CVA. Other Factors: S/p Left carotid endartectomy. Performing Technologist: Bobetta Lime BS, RVT  Examination Guidelines: Chasty Randal complete evaluation includes B-mode imaging, spectral Doppler, color Doppler, and power Doppler as needed of all accessible portions of each vessel. Bilateral testing is considered an integral part of Paige Monarrez complete examination. Limited examinations for reoccurring indications may be performed as noted.  Right Carotid Findings: +----------+--------+--------+--------+------------------+------------------+           PSV cm/sEDV cm/sStenosisPlaque DescriptionComments           +----------+--------+--------+--------+------------------+------------------+ CCA Prox  47      13                                                   +----------+--------+--------+--------+------------------+------------------+ CCA Distal51      12                                intimal thickening +----------+--------+--------+--------+------------------+------------------+ ICA Prox  383     45      40-59%  calcific                             +----------+--------+--------+--------+------------------+------------------+ ICA Distal154     18                                                   +----------+--------+--------+--------+------------------+------------------+ ECA       522     38              calcific                             +----------+--------+--------+--------+------------------+------------------+ +----------+--------+-------+----------------+-------------------+           PSV cm/sEDV cmsDescribe  Arm Pressure (mmHG) +----------+--------+-------+----------------+-------------------+ Subclavian218             Multiphasic, WNL                    +----------+--------+-------+----------------+-------------------+ +---------+--------+--+--------+--+---------+ VertebralPSV cm/s87EDV cm/s18Antegrade +---------+--------+--+--------+--+---------+  Left Carotid Findings: +----------+--------+--------+--------+------------------+--------+           PSV cm/sEDV cm/sStenosisPlaque DescriptionComments +----------+--------+--------+--------+------------------+--------+ CCA Prox  108     13                                         +----------+--------+--------+--------+------------------+--------+ CCA Distal134     20      <50%    calcific                   +----------+--------+--------+--------+------------------+--------+ ICA Prox  71      9                                          +----------+--------+--------+--------+------------------+--------+ ICA Distal81      19                                         +----------+--------+--------+--------+------------------+--------+ ECA       137     15                                         +----------+--------+--------+--------+------------------+--------+ +----------+--------+--------+----------------+-------------------+           PSV cm/sEDV cm/sDescribe        Arm Pressure (mmHG) +----------+--------+--------+----------------+-------------------+ Subclavian224             Multiphasic, WNL                    +----------+--------+--------+----------------+-------------------+ +---------+--------+--+--------+--+---------+ VertebralPSV cm/s87EDV cm/s25Antegrade +---------+--------+--+--------+--+---------+ Left CEA site noted.  Summary: Right Carotid: Velocities in the right ICA are consistent with Siddharth Babington 60-79%                stenosis. The ECA appears >50% stenosed. Calcific shadowing may                have prohibited higher velocities to be obtained. Left Carotid: Velocities in the left ICA are consistent with Kandi Brusseau 1-39%  stenosis.               Non-hemodynamically significant plaque <50% noted in the CCA. Left               CEA site noted. Vertebrals:  Bilateral vertebral arteries demonstrate antegrade flow. Subclavians: Normal flow hemodynamics were seen in bilateral subclavian              arteries. *See table(s) above for measurements and observations.  Electronically signed by Antony Contras MD on 07/26/2021 at 1:37:38 PM.    Final    ECHOCARDIOGRAM COMPLETE BUBBLE STUDY  Result Date: 07/25/2021    ECHOCARDIOGRAM REPORT   Patient Name:   Wanda [name] Date of Exam: 07/25/2021 Medical Rec #:  [id number]           Height:       61.0 in Accession #:  [id number]          Weight:       172.0 lb Date of Birth:  1940-06-05            BSA:          1.771 m Patient Age:    59 years            BP:           150/72 mmHg Patient Gender: F                   HR:           95 bpm. Exam Location:  Inpatient Procedure: 2D Echo, Cardiac Doppler and Color Doppler Indications:    Stroke  History:        Patient has prior history of Echocardiogram examinations, most                 recent 09/20/2018. Risk Factors:Hypertension and Diabetes.  Sonographer:    Jefferey Pica Referring Phys: [place]  1. Peak intra-cavitary gradient 60 mmHg at rest and 68 mmHg with Valsalva. Left ventricular ejection fraction, by estimation, is >75%. The left ventricle has hyperdynamic function. The left ventricle has no regional wall motion abnormalities. There is mild left ventricular hypertrophy. Left ventricular diastolic parameters were normal.  2. Right ventricular systolic function is normal. The right ventricular size is normal. There is normal pulmonary artery systolic pressure.  3. The mitral valve was not well visualized. Trivial mitral valve regurgitation. No evidence of mitral stenosis. Moderate mitral annular calcification.  4. The aortic valve was not well visualized. Aortic valve regurgitation is not visualized. No aortic  stenosis is present.  5. The inferior vena cava is normal in size with greater than 50% respiratory variability, suggesting right atrial pressure of 3 mmHg. Conclusion(s)/Recommendation(s): No intracardiac source of embolism detected on this transthoracic study. Consider Ranger Petrich transesophageal echocardiogram to exclude cardiac source of embolism if clinically indicated. FINDINGS  Left Ventricle: Peak intra-cavitary gradient 60 mmHg at rest and 68 mmHg with Valsalva. Left ventricular ejection fraction, by estimation, is >75%. The left ventricle has hyperdynamic function. The left ventricle has no regional wall motion abnormalities. The left ventricular internal cavity size was normal in size. There is mild left ventricular hypertrophy. Left ventricular diastolic parameters were normal. Right Ventricle: The right ventricular size is normal. Right vetricular wall thickness was not well visualized. Right ventricular systolic function is normal. There is normal pulmonary artery systolic pressure. The tricuspid regurgitant velocity is 1.22 m/s, and with an assumed right atrial pressure of 3 mmHg, the estimated right ventricular systolic pressure is 9.0 mmHg. Left Atrium: Left atrial size was normal in size. Right Atrium: Right atrial size was normal in size. Pericardium: Trivial pericardial effusion is present. Presence of epicardial fat layer. Mitral Valve: The mitral valve was not well visualized. Moderate mitral annular calcification. Trivial mitral valve regurgitation. No evidence of mitral valve stenosis. Tricuspid Valve: The tricuspid valve is grossly normal. Tricuspid valve regurgitation is not demonstrated. No evidence of tricuspid stenosis. Aortic Valve: The aortic valve was not well visualized. Aortic valve regurgitation is not visualized. No aortic stenosis is present. Aortic valve peak gradient measures 7.4 mmHg. Pulmonic Valve: The pulmonic valve was not well visualized. Pulmonic valve regurgitation is not  visualized. Aorta: The aortic root, ascending aorta, aortic arch and descending aorta are all structurally normal, with no evidence of dilitation or obstruction. Venous: The inferior vena cava is  normal in size with greater than 50% respiratory variability, suggesting right atrial pressure of 3 mmHg. IAS/Shunts: The interatrial septum was not well visualized.  LEFT VENTRICLE PLAX 2D LVIDd:         4.00 cm   Diastology LVIDs:         1.80 cm   LV e' medial:    4.80 cm/s LV PW:         1.10 cm   LV E/e' medial:  12.8 LV IVS:        1.00 cm   LV e' lateral:   6.68 cm/s LVOT diam:     2.00 cm   LV E/e' lateral: 9.2 LV SV:         70 LV SV Index:   40 LVOT Area:     3.14 cm  IVC IVC diam: 1.40 cm LEFT ATRIUM             Index        RIGHT ATRIUM           Index LA diam:        3.70 cm 2.09 cm/m   RA Area:     13.90 cm LA Vol (A2C):   28.4 ml 16.03 ml/m  RA Volume:   34.10 ml  19.25 ml/m LA Vol (A4C):   26.5 ml 14.96 ml/m LA Biplane Vol: 28.1 ml 15.86 ml/m  AORTIC VALVE                 PULMONIC VALVE AV Area (Vmax): 3.58 cm     PV Vmax:       1.19 m/s AV Vmax:        136.00 cm/s  PV Peak grad:  5.7 mmHg AV Peak Grad:   7.4 mmHg LVOT Vmax:      155.00 cm/s LVOT Vmean:     79.800 cm/s LVOT VTI:       0.224 m  AORTA Ao Root diam: 3.30 cm Ao Asc diam:  2.50 cm MITRAL VALVE                TRICUSPID VALVE MV Area (PHT): 5.97 cm     TR Peak grad:   6.0 mmHg MV Decel Time: 127 msec     TR Vmax:        122.00 cm/s MV E velocity: 61.20 cm/s MV Trenisha Lafavor velocity: 124.00 cm/s  SHUNTS MV E/Haile Toppins ratio:  0.49         Systemic VTI:  0.22 m                             Systemic Diam: 2.00 cm Buford Dresser MD Electronically signed by Buford Dresser MD Signature Date/Time: 07/25/2021/5:43:26 PM    Final    MR BRAIN WO CONTRAST  Result Date: 07/25/2021 CLINICAL DATA:  Stroke follow-up. EXAM: MRI HEAD WITHOUT CONTRAST TECHNIQUE: Multiplanar, multiecho pulse sequences of the brain and surrounding structures were obtained  without intravenous contrast. COMPARISON:  CT/CTA head and neck dated 1 day prior FINDINGS: Image quality is degraded by motion artifact particularly involving the axial SWI and axial T2 sequences. Brain: There is moderate-sized region of diffusion restriction and associated FLAIR signal abnormality centered in the right parietal lobe in the MCA distribution consistent with evolving acute to subacute infarct. There are numerous additional patchy foci of diffusion restriction and FLAIR signal abnormality throughout both cerebral hemispheres involving the bilateral frontal lobes and right temporal  lobe consistent with additional smaller acute to subacute infarcts. The SWI sequence is significantly motion degraded, but there is no definite hemorrhagic transformation. There is no definite acute intracranial hemorrhage or extra-axial fluid collection. Background parenchymal volume is normal for age. The ventricles are stable in size. There are Hatice Bubel few small remote lacunar infarcts in the bilateral corona radiata/centrum semiovale and background advanced chronic white matter microangiopathy. There is no solid mass lesion.  There is no midline shift. Vascular: Normal flow voids. Skull and upper cervical spine: Normal marrow signal. Sinuses/Orbits: The paranasal sinuses are clear. Bilateral lens implants are in place. The globes and orbits are otherwise unremarkable. Other: None. IMPRESSION: 1. Image quality is significantly degraded by motion artifact. 2. Multiple evolving acute to subacute infarcts in both cerebral hemispheres (right more than left) as above with the largest infarct in the right parietal lobe in the MCA distribution corresponding to the finding on the prior noncontrast CT head. No definite hemorrhagic transformation. Electronically Signed   By: Valetta Mole M.D.   On: 07/25/2021 17:00   DG CHEST PORT 1 VIEW  Result Date: 07/25/2021 CLINICAL DATA:  An 81 year old female presents for evaluation of fever,  history of stroke. EXAM: PORTABLE CHEST 1 VIEW COMPARISON:  September 19, 2018 FINDINGS: EKG leads project over the chest. Cardiomediastinal contours and hilar structures are stable. Lungs are clear. No sign of effusion or pneumothorax on frontal radiograph. On limited assessment no acute skeletal process. IMPRESSION: No acute cardiopulmonary disease. Electronically Signed   By: Zetta Bills M.D.   On: 07/25/2021 09:28        Scheduled Meds:  clopidogrel  75 mg Oral Daily   heparin  5,000 Units Subcutaneous Q8H   insulin aspart  0-20 Units Subcutaneous TID WC   insulin aspart  0-5 Units Subcutaneous QHS   [START ON 07/27/2021] insulin aspart  3 Units Subcutaneous TID WC   insulin glargine-yfgn  20 Units Subcutaneous Daily   potassium chloride  40 mEq Oral Q4H   Continuous Infusions:  sodium chloride 50 mL/hr at 07/26/21 1403   magnesium sulfate bolus IVPB       LOS: 2 days    Time spent: over 30 min    Fayrene Helper, MD [facility]   To contact the attending provider between 7A-7P or the covering provider during after hours 7P-7A, please log into the web site [email or web address] and access using universal Enterprise password for that web site. If you do not have the password, please call the hospital operator.  07/26/2021, 5:11 PM

## 2021-07-26 NOTE — Assessment & Plan Note (Addendum)
Obesity class 1 with calculated BMI 32.4

## 2021-07-27 DIAGNOSIS — E876 Hypokalemia: Secondary | ICD-10-CM

## 2021-07-27 DIAGNOSIS — I10 Essential (primary) hypertension: Secondary | ICD-10-CM

## 2021-07-27 DIAGNOSIS — E042 Nontoxic multinodular goiter: Secondary | ICD-10-CM | POA: Diagnosis not present

## 2021-07-27 DIAGNOSIS — I6521 Occlusion and stenosis of right carotid artery: Secondary | ICD-10-CM | POA: Diagnosis not present

## 2021-07-27 DIAGNOSIS — I639 Cerebral infarction, unspecified: Secondary | ICD-10-CM | POA: Diagnosis not present

## 2021-07-27 LAB — COMPREHENSIVE METABOLIC PANEL
ALT: 41 U/L (ref 0–44)
AST: 34 U/L (ref 15–41)
Albumin: 3.2 g/dL — ABNORMAL LOW (ref 3.5–5.0)
Alkaline Phosphatase: 42 U/L (ref 38–126)
Anion gap: 8 (ref 5–15)
BUN: 12 mg/dL (ref 8–23)
CO2: 22 mmol/L (ref 22–32)
Calcium: 9.1 mg/dL (ref 8.9–10.3)
Chloride: 113 mmol/L — ABNORMAL HIGH (ref 98–111)
Creatinine, Ser: 0.82 mg/dL (ref 0.44–1.00)
GFR, Estimated: >60 mL/min (ref >60)
Glucose, Bld: 207 mg/dL — ABNORMAL HIGH (ref 70–99)
Potassium: 3.9 mmol/L (ref 3.5–5.1)
Sodium: 143 mmol/L (ref 135–145)
Total Bilirubin: 0.9 mg/dL (ref 0.3–1.2)
Total Protein: 6.4 g/dL — ABNORMAL LOW (ref 6.5–8.1)

## 2021-07-27 LAB — GLUCOSE, CAPILLARY
Glucose-Capillary: 253 mg/dL — ABNORMAL HIGH (ref 70–99)
Glucose-Capillary: 138 mg/dL — ABNORMAL HIGH (ref 70–99)
Glucose-Capillary: 250 mg/dL — ABNORMAL HIGH (ref 70–99)

## 2021-07-27 LAB — CBC WITH DIFFERENTIAL/PLATELET
Abs Immature Granulocytes: 0.03 10*3/uL (ref 0.00–0.07)
Basophils Absolute: 0 10*3/uL (ref 0.0–0.1)
Basophils Relative: 1 %
Eosinophils Absolute: 0.1 10*3/uL (ref 0.0–0.5)
Eosinophils Relative: 1 %
HCT: 36.8 % (ref 36.0–46.0)
Hemoglobin: 13 g/dL (ref 12.0–15.0)
Immature Granulocytes: 0 %
Lymphocytes Relative: 24 %
Lymphs Abs: 1.9 10*3/uL (ref 0.7–4.0)
MCH: 31.1 pg (ref 26.0–34.0)
MCHC: 35.3 g/dL (ref 30.0–36.0)
MCV: 88 fL (ref 80.0–100.0)
Monocytes Absolute: 0.7 10*3/uL (ref 0.1–1.0)
Monocytes Relative: 9 %
Neutro Abs: 5 10*3/uL (ref 1.7–7.7)
Neutrophils Relative %: 65 %
Platelets: 240 10*3/uL (ref 150–400)
RBC: 4.18 MIL/uL (ref 3.87–5.11)
RDW: 13 % (ref 11.5–15.5)
WBC: 7.8 10*3/uL (ref 4.0–10.5)
nRBC: 0 % (ref 0.0–0.2)

## 2021-07-27 LAB — PHOSPHORUS: Phosphorus: 3.2 mg/dL (ref 2.5–4.6)

## 2021-07-27 LAB — MAGNESIUM: Magnesium: 2 mg/dL (ref 1.7–2.4)

## 2021-07-27 MED ORDER — TRAZODONE HCL 50 MG PO TABS
50.0000 mg | ORAL_TABLET | Freq: Every day | ORAL | Status: DC
  Administered 2021-07-27: 50 mg via ORAL
  Filled 2021-07-27: qty 1

## 2021-07-27 MED ORDER — INSULIN GLARGINE-YFGN 100 UNIT/ML ~~LOC~~ SOLN
25.0000 [IU] | Freq: Every day | SUBCUTANEOUS | Status: DC
  Administered 2021-07-28 – 2021-07-29 (×2): 25 [IU] via SUBCUTANEOUS
  Filled 2021-07-27 (×2): qty 0.25

## 2021-07-27 MED ORDER — AMLODIPINE BESYLATE 5 MG PO TABS
5.0000 mg | ORAL_TABLET | Freq: Every day | ORAL | Status: DC
  Administered 2021-07-27 – 2021-07-29 (×3): 5 mg via ORAL
  Filled 2021-07-27 (×3): qty 1

## 2021-07-27 MED ORDER — INSULIN GLARGINE-YFGN 100 UNIT/ML ~~LOC~~ SOLN
5.0000 [IU] | Freq: Once | SUBCUTANEOUS | Status: AC
  Administered 2021-07-27: 5 [IU] via SUBCUTANEOUS
  Filled 2021-07-27: qty 0.05

## 2021-07-27 NOTE — Assessment & Plan Note (Addendum)
Her blood pressure has improved with systolic in the 704 mmHg range.   At home patient on amlodipine, clonidine and HCTZ.  Continue amlodipine for now, avoid hypotension in the setting of severe carotid artery disease.

## 2021-07-27 NOTE — Care Management Important Message (Signed)
Important Message  Patient Details  Name: Nashya Garlington MRN: [id number] Date of Birth: 12-26-1940   Medicare Important Message Given:  Yes     Oretta Berkland Montine Circle 07/27/2021, 11:29 AM

## 2021-07-27 NOTE — Progress Notes (Addendum)
  Progress Note    07/27/2021 10:25 AM * No surgery found *  Subjective: Confused this morning.  Perseverating about wanting to be "let free."  Thinks she is in [place], [place].   Vitals:   07/27/21 0356 07/27/21 0738  BP: (!) 146/80 (!) 184/84  Pulse: 78 (!) 108  Resp: 20   Temp: 98.7 F (37.1 C) 97.7 F (36.5 C)  SpO2: 95% 98%   Physical Exam: Lungs:  non labored Extremities:  Left arm weakness; near symmetrical grip strengh Neurologic: Confused; left arm weakness  CBC    Component Value Date/Time   WBC 7.8 07/27/2021 0424   RBC 4.18 07/27/2021 0424   HGB 13.0 07/27/2021 0424   HCT 36.8 07/27/2021 0424   PLT 240 07/27/2021 0424   MCV 88.0 07/27/2021 0424   MCV 90.8 06/19/2011 1602   MCH 31.1 07/27/2021 0424   MCHC 35.3 07/27/2021 0424   RDW 13.0 07/27/2021 0424   LYMPHSABS 1.9 07/27/2021 0424   MONOABS 0.7 07/27/2021 0424   EOSABS 0.1 07/27/2021 0424   BASOSABS 0.0 07/27/2021 0424    BMET    Component Value Date/Time   NA 143 07/27/2021 0424   K 3.9 07/27/2021 0424   CL 113 (H) 07/27/2021 0424   CO2 22 07/27/2021 0424   GLUCOSE 207 (H) 07/27/2021 0424   BUN 12 07/27/2021 0424   CREATININE 0.82 07/27/2021 0424   CREATININE 1.09 (H) 09/25/2019 1405   CALCIUM 9.1 07/27/2021 0424   GFRNONAA >60 07/27/2021 0424   GFRAA >60 11/14/2018 0545    INR    Component Value Date/Time   INR 1.0 07/24/2021 1122     Intake/Output Summary (Last 24 hours) at 07/27/2021 1025 Last data filed at 07/27/2021 0100 Gross per 24 hour  Intake 1800.13 ml  Output 1000 ml  Net 800.13 ml     Assessment/Plan:  81 y.o. female with symptomatic right carotid artery stenosis  Patient seems to be confused this morning on exam.  She also has persistent left arm weakness Based on the nature of her right carotid stenosis, she would not be a candidate for endarterectomy or TCAR.  Dr. Scot Dock recommended maximal medical therapy versus consulting IR to evaluate for possible  transfemoral carotid stenting   Dagoberto Ligas, PA-C [facility] [phone] 07/27/2021 10:25 AM  I have interviewed the patient and examined the patient. I agree with the findings by the PA.  I did discuss the situation with her daughter over the phone this morning and she understands the issues involved which are complicated.   Gae Gallop, MD

## 2021-07-27 NOTE — Progress Notes (Signed)
Resting quietly and comfortably at this time.  Did receive 1 mg Lorazepam after being combative, belligerent, and threatening to leave.  Was yelling down the hallway, upsetting other patients.  All interventions to try to calm her did not work.  I did page Dr Aileen Fass, who gave order for 1 time dose and pt was calm and quiet and cooperative within minutes.

## 2021-07-27 NOTE — Progress Notes (Signed)
Inpatient Diabetes Program Recommendations  AACE/ADA: New Consensus Statement on Inpatient Glycemic Control (2015)  Target Ranges:  Prepandial:   less than 140 mg/dL      Peak postprandial:   less than 180 mg/dL (1-2 hours)      Critically ill patients:  140 - 180 mg/dL   Lab Results  Component Value Date   GLUCAP 253 (H) 07/27/2021   HGBA1C 11.1 (H) 07/25/2021    Review of Glycemic Control  Latest Reference Range & Units 07/25/21 06:23 07/25/21 11:39 07/25/21 17:43 07/25/21 21:22 07/26/21 06:10  Glucose-Capillary 70 - 99 mg/dL 311 (H) 268 (H) 281 (H) 370 (H) 281 (H)    Latest Reference Range & Units 07/26/21 06:10 07/26/21 11:43 07/26/21 17:31 07/26/21 21:32 07/27/21 06:20  Glucose-Capillary 70 - 99 mg/dL 281 (H) 230 (H) 214 (H) 221 (H) 253 (H)   Diabetes history: DM 2 Outpatient Diabetes medications: Lantus 35 units Daily, Glipizide 10 mg Daily, 5 mg qpm Glucotrol 5-10 mg daily, Lantus 35 units daily (ordered 20 units Lantus) Current orders for Inpatient glycemic control:  Novolog 0-20 tid + HS Semglee 20 units Daily  A1c 11.1% on 7/17  Inpatient Diabetes Program Recommendations:    -  Consider increasing Semglee to 26-28 units  Thanks,  Tama Headings RN, MSN, BC-ADM Inpatient Diabetes Coordinator Team Pager [phone] (8a-5p)

## 2021-07-27 NOTE — Progress Notes (Signed)
Physical Therapy Treatment Patient Details Name: Wanda [name] MRN: [id number] DOB: [birth year] Today's Date: 07/27/2021   History of Present Illness Pt is an 82 y.o. female presenting to [facility] ED on 07/24/2021 with left-sided weakness and right gaze deviation. CT + subacute infarct in R temporal lobe. MRI revealed multiple evolving acute to subacute infarcts in both cerebral  hemispheres (right more than left) with the largest infarct in the right parietal lobe in the MCA distribution consistent with prior CT. PMH includes: arthritis, carotid stenosis, DM2, HTN, urinary leakage, s/p L carotid endarterectomy, B TKA.    PT Comments    Pt progressing slowly towards physical therapy goals. Session limited by increased confusion leading to mild agitation by end of session. Pt required reorientation to time, place, and situation multiple times. Unsafe to progress mobility OOB to chair this session, even with lift equipment. Will continue to follow and progress with PT POC.    Recommendations for follow up therapy are one component of a multi-disciplinary discharge planning process, led by the attending physician.  Recommendations may be updated based on patient status, additional functional criteria and insurance authorization.  Follow Up Recommendations  Acute inpatient rehab (3hours/day)     Assistance Recommended at Discharge Frequent or constant Supervision/Assistance  Patient can return home with the following Two people to help with walking and/or transfers;Two people to help with bathing/dressing/bathroom;Assistance with cooking/housework;Assist for transportation;Direct supervision/assist for medications management   Equipment Recommendations  Other (comment) (TBD by next level of care)    Recommendations for Other Services       Precautions / Restrictions Precautions Precautions: Fall Precaution Comments: L neglect Restrictions Weight Bearing Restrictions: No     Mobility  Bed  Mobility Overal bed mobility: Needs Assistance Bed Mobility: Rolling, Sidelying to Sit, Sit to Supine Rolling: Mod assist, +2 for safety/equipment Sidelying to sit: Max assist, HOB elevated, +2 for safety/equipment   Sit to supine: Mod assist, +2 for physical assistance   General bed mobility comments: Assist for all aspects of bed mobility. Pt performed rolling multiple times due to BM and needing to be cleaned. Due to agitation, opted for bed pan.    Transfers                   General transfer comment: Unable to progress to OOB mobility this session due to agitation and poor sitting balance, as well as not following commands to correct heavy R lateral lean as she was doing yesterday.    Ambulation/Gait                   Stairs             Wheelchair Mobility    Modified Rankin (Stroke Patients Only) Modified Rankin (Stroke Patients Only) Pre-Morbid Rankin Score: No symptoms Modified Rankin: Severe disability     Balance Overall balance assessment: Needs assistance Sitting-balance support: Single extremity supported, No upper extremity supported, Feet supported Sitting balance-Leahy Scale: Zero Sitting balance - Comments: Up to max assist required for static sitting balance. Heavy R lateral lean. With assist for L lateral lean to midline, pt calling out and fearful, yelling that she is falling despite being safe on the bed with +2 assist. Pt holding feet up in the air throughout sitting EOB. Postural control: Posterior lean, Right lateral lean  Cognition Arousal/Alertness: Awake/alert Behavior During Therapy: Anxious, Restless, Agitated Overall Cognitive Status: Impaired/Different from baseline Area of Impairment: Attention, Safety/judgement, Following commands, Problem solving, Awareness, Orientation, Memory                 Orientation Level: Disoriented to, Place, Time, Situation Current  Attention Level: Sustained Memory: Decreased short-term memory Following Commands: Follows one step commands inconsistently, Follows one step commands with increased time Safety/Judgement: Decreased awareness of safety, Decreased awareness of deficits Awareness: Intellectual Problem Solving: Slow processing, Decreased initiation, Difficulty sequencing, Requires verbal cues, Requires tactile cues General Comments: Pt disoriented, paranoid this session. Reports that she is being held here against her will and that there are "bad people" here "doing bad things" to her. Reoriented to situation and pt does not believe she has had a stroke. States "only some of what you're saying is true, but I shouldn't be here."        Exercises      General Comments        Pertinent Vitals/Pain Pain Assessment Pain Assessment: Faces Faces Pain Scale: No hurt    Home Living                          Prior Function            PT Goals (current goals can now be found in the care plan section) Acute Rehab PT Goals Patient Stated Goal: Go home PT Goal Formulation: With patient Time For Goal Achievement: 08/01/21 Potential to Achieve Goals: Good Progress towards PT goals: Not progressing toward goals - comment (Cognition limiting session and progression today)    Frequency    Min 4X/week      PT Plan Current plan remains appropriate    Co-evaluation              AM-PAC PT "6 Clicks" Mobility   Outcome Measure  Help needed turning from your back to your side while in a flat bed without using bedrails?: A Lot Help needed moving from lying on your back to sitting on the side of a flat bed without using bedrails?: A Lot Help needed moving to and from a bed to a chair (including a wheelchair)?: Total Help needed standing up from a chair using your arms (e.g., wheelchair or bedside chair)?: Total Help needed to walk in hospital room?: Total Help needed climbing 3-5 steps with  a railing? : Total 6 Click Score: 8    End of Session Equipment Utilized During Treatment: Gait belt Activity Tolerance: Patient tolerated treatment well Patient left: in bed;with call bell/phone within reach;with bed alarm set Nurse Communication: Mobility status PT Visit Diagnosis: Unsteadiness on feet (R26.81);Difficulty in walking, not elsewhere classified (R26.2);Other symptoms and signs involving the nervous system (R29.898)     Time: 1104-1130 PT Time Calculation (min) (ACUTE ONLY): 26 min  Charges:  $Therapeutic Activity: 23-37 mins                     Rolinda Roan, PT, DPT Acute Rehabilitation Services Secure Chat Preferred Office: [phone]    Thelma Comp 07/27/2021, 12:15 PM

## 2021-07-27 NOTE — Plan of Care (Signed)
  Problem: Coping: Goal: Level of anxiety will decrease Outcome: Progressing   Problem: Elimination: Goal: Will not experience complications related to bowel motility Outcome: Progressing Goal: Will not experience complications related to urinary retention Outcome: Progressing   Problem: Pain Managment: Goal: General experience of comfort will improve Outcome: Progressing   

## 2021-07-27 NOTE — Consult Note (Signed)
 Chief Complaint: Patient was seen in consultation today for  Chief Complaint  Patient presents with   Code Stroke    Referring Physician(s): Dr. Sethi  Supervising Physician: De Macedo Rodrigues, [place]  Patient Status: [facility] - In-pt  History of Present Illness: Wanda [name] is an 81 y.o. female with a medical history significant DM2, HTN, and carotid stenosis with a prior left carotid endarterectomy in 2020 by Vascular.   She presented to the ED 07/24/21 after she was found on the ground with left-sided numbness, weakness and right gaze preference. Code Stroke was activated by EMS. Imaging was positive for a subacute infarct in the right temporal lobe. Her last known well was outside of the window for thrombolytics. No emergent thrombectomy performed because CT was negative for large vessel occlusion.  Work up did reveal significant stenosis in the right ICA and this was deemed the likely cause of her symptoms.   CTA Head/Neck 07/24/21 IMPRESSION: 1. Cytotoxic edema in the posterior Right MCA territory (ASPECTS 8) with no hemorrhage or mass effect. CTP does not detect infarct core, suggesting it might be subacute. And only small volume asymmetric T-max abnormality in the right hemisphere. 2. No right MCA branch occlusion identified. Motion artifact in the neck but suspicion of high-grade right ICA origin stenosis, probably RADIOGRAPHIC STRING SIGN. Right siphon calcified plaque but only mild stenosis. 3. Evidence of previous Left Carotid Endarterectomy. Mild to moderate left siphon stenosis. 4. Atherosclerotic and highly stenotic bilateral vertebral arteries, the left is dominant. Mild bilateral PCA origin and moderate Left PCA P2 stenoses. 5. Aortic Atherosclerosis (ICD10-I70.0).  A carotid duplex study revealed velocities in the right ICA consistent with 60-79% stenosis and the ECA appears to be >50% stenosed. Vascular surgery was consulted for possible  endarterectomy but imaging shows calcific plaque extending very high which would not be surgically accessible.   Interventional Radiology has been asked to evaluate this patient for an image-guided diagnostic cerebral angiogram with possible carotid stenting. Imaging reviewed by Dr. de Macedo Rodrigues.   Past Medical History:  Diagnosis Date   Arthritis    Carotid stenosis 11/2018   Cataract    surgery to remove   Diabetes mellitus    type 2   Hyperlipidemia    Hypertension    Skin cancer    Removed from face   Stroke (HCC) 09/19/2018   Urgency of urination    Urinary leakage     Past Surgical History:  Procedure Laterality Date   ABDOMINAL HYSTERECTOMY  early 80's   total   CAROTID ENDARTERECTOMY Left 11/13/2018   CARPAL TUNNEL RELEASE Bilateral 1980 and 1981   both hands   ENDARTERECTOMY Left 11/13/2018   Procedure: Left Carotid Artery Endarterectomy with Patch Angioplasty;  Surgeon: Early, Todd F, MD;  Location: [facility] OR;  Service: Vascular;  Laterality: Left;   EYE SURGERY     bilateral cataracts   Fatty Tumor Excision     JOINT REPLACEMENT     TONSILLECTOMY     TONSILLECTOMY AND ADENOIDECTOMY  age 5   TOTAL KNEE ARTHROPLASTY Right 10/04/2015   TOTAL KNEE ARTHROPLASTY Right 10/04/2015   Procedure: TOTAL KNEE ARTHROPLASTY;  Surgeon: John Graves, MD;  Location: [facility] OR;  Service: Orthopedics;  Laterality: Right;   TOTAL KNEE ARTHROPLASTY Left 01/28/2016   Procedure: TOTAL KNEE ARTHROPLASTY;  Surgeon: John Graves, MD;  Location: [facility] OR;  Service: Orthopedics;  Laterality: Left;   TUBAL LIGATION      Allergies: Ace inhibitors, Alendronate sodium,   Aspirin, Crestor [rosuvastatin], Lipitor [atorvastatin calcium], Metformin and related, and Shellfish allergy  Medications: Prior to Admission medications   Medication Sig Start Date End Date Taking? Authorizing Provider  amLODipine (NORVASC) 5 MG tablet TAKE 1 TABLET(5 MG) BY MOUTH DAILY Patient taking differently: Take 5 mg by  mouth daily. 03/16/21  Yes Copland, Gay Filler, MD  cloNIDine (CATAPRES) 0.1 MG tablet Take 1 tablet (0.1 mg total) by mouth 2 (two) times daily. 07/20/21  Yes Copland, Gay Filler, MD  clopidogrel (PLAVIX) 75 MG tablet Take 1 tablet (75 mg total) by mouth daily. 03/16/21  Yes Copland, Gay Filler, MD  glipiZIDE (GLUCOTROL XL) 5 MG 24 hr tablet Take 2 tabs in the morning and 1 tab in the evening. Patient taking differently: Take 5-10 mg by mouth See admin instructions. Take 2 tabs in the morning and 1 tab in the evening. 03/16/21  Yes Copland, Gay Filler, MD  hydrochlorothiazide (HYDRODIURIL) 25 MG tablet Take 1 tablet (25 mg total) by mouth daily. 03/16/21  Yes Copland, Gay Filler, MD  insulin glargine (LANTUS SOLOSTAR) 100 UNIT/ML Solostar Pen ADMINISTER 20 UNITS UNDER THE SKIN DAILY Patient taking differently: Inject 35 Units into the skin daily. 03/16/21  Yes Copland, Gay Filler, MD  blood glucose meter kit and supplies Dispense based on patient and insurance preference. Pt just needs meter 02/26/17   Copland, Gay Filler, MD  Blood Glucose Monitoring Suppl (ONE TOUCH ULTRA MINI) w/Device KIT Use to test blood sugar daily as instructed. Dx: E11.65 01/26/21   Copland, Gay Filler, MD  glucose blood (ONE TOUCH ULTRA TEST) test strip Test blood sugar 3 times a day. Dx code: 250.00 03/16/21   Copland, Gay Filler, MD  glucose blood (ONETOUCH ULTRA) test strip Use as instructed 06/08/21   Copland, Gay Filler, MD  Insulin Pen Needle (BD PEN NEEDLE NANO U/F) 32G X 4 MM MISC USE TO INJECT INSULIN ONCE DAILY 08/31/16   Copland, Gay Filler, MD  Insulin Syringe-Needle U-100 (INSULIN SYRINGE .5CC/31GX5/16") 31G X 5/16" 0.5 ML MISC Use to inject insulin 1 time daily. 09/23/14   Philemon Kingdom, MD  Lancets MISC 1 each by Does not apply route 3 (three) times daily. Dx: E11.65 01/26/21   Copland, Gay Filler, MD  lisinopril (PRINIVIL,ZESTRIL) 20 MG tablet Take 20 mg by mouth daily.   03/23/11  [provider]     Family History   Problem Relation Age of Onset   Cancer Mother        liver   Breast cancer Mother 56   Pancreatitis Father        deceased 23   Cancer Brother        GI   Coronary artery disease Paternal Grandmother    Cancer Son        terminal kidney   Breast cancer Maternal Aunt    Cancer Other    Coronary artery disease Paternal Uncle     Social History   Socioeconomic History   Marital status: Widowed    Spouse name: Not on file   Number of children: Not on file   Years of education: Not on file   Highest education level: Not on file  Occupational History   Not on file  Tobacco Use   Smoking status: Never   Smokeless tobacco: Never  Vaping Use   Vaping Use: Never used  Substance and Sexual Activity   Alcohol use: Yes    Alcohol/week: 1.0 standard drink of alcohol    Types: 1  Standard drinks or equivalent per week    Comment: maybe once per month - 3 drinks   Drug use: No   Sexual activity: Not Currently    Birth control/protection: Post-menopausal    Comment: Hysterectomy  Other Topics Concern   Not on file  Social History Narrative   Widowed. Education: [employer]. Exercise: 2 times a week for 30 minutes.   Social Determinants of Health   Financial Resource Strain: Low Risk  (03/18/2020)   Overall Financial Resource Strain (CARDIA)    Difficulty of Paying Living Expenses: Not hard at all  Food Insecurity: No Food Insecurity (03/18/2020)   Hunger Vital Sign    Worried About Running Out of Food in the Last Year: Never true    Ran Out of Food in the Last Year: Never true  Transportation Needs: No Transportation Needs (03/18/2020)   PRAPARE - Hydrologist (Medical): No    Lack of Transportation (Non-Medical): No  Physical Activity: Inactive (03/18/2020)   Exercise Vital Sign    Days of Exercise per Week: 0 days    Minutes of Exercise per Session: 0 min  Stress: No Stress Concern Present (03/18/2020)   [place]    Feeling of Stress : Not at all  Social Connections: Socially Isolated (03/18/2020)   Social Connection and Isolation Panel [NHANES]    Frequency of Communication with Friends and Family: More than three times a week    Frequency of Social Gatherings with Friends and Family: More than three times a week    Attends Religious Services: Never    Marine scientist or Organizations: No    Attends Archivist Meetings: Never    Marital Status: Widowed    Review of Systems: A 12 point ROS discussed and pertinent positives are indicated in the HPI above.  All other systems are negative.  Review of Systems  Unable to perform ROS: Mental status change    Vital Signs: BP (!) 184/84 (BP Location: Left Arm)   Pulse (!) 108   Temp 97.7 F (36.5 C) (Oral)   Resp 20   Ht 5' 1" (1.549 m)   Wt 171 lb 15.3 oz (78 kg)   LMP  (LMP Unknown)   SpO2 98%   BMI 32.49 kg/m   Physical Exam Constitutional:      General: She is not in acute distress.    Appearance: She is not ill-appearing.  Pulmonary:     Effort: Pulmonary effort is normal.  Neurological:     Mental Status: She is alert. She is disoriented.     Imaging: VAS US CAROTID  Result Date: 07/26/2021 Carotid Arterial Duplex Study Patient Name:  Wanda [name]  Date of Exam:   07/26/2021 Medical Rec #: [id number]            Accession #:    [id number] Date of Birth: 06/25/1940             Patient Gender: F Patient Age:   24 years Exam Location:  [facility] Procedure:      VAS US CAROTID Referring Phys: PRAMOD SETHI --------------------------------------------------------------------------------  Indications:   CVA. Risk Factors:  Hypertension, hyperlipidemia, Diabetes, prior CVA. Other Factors: S/p Left carotid endartectomy. Performing Technologist: Bobetta Lime BS, RVT  Examination Guidelines: A complete evaluation includes B-mode imaging, spectral Doppler, color Doppler, and power  Doppler as needed of all accessible portions of each vessel. Bilateral  testing is considered an integral part of a complete examination. Limited examinations for reoccurring indications may be performed as noted.  Right Carotid Findings: +----------+--------+--------+--------+------------------+------------------+           PSV cm/sEDV cm/sStenosisPlaque DescriptionComments           +----------+--------+--------+--------+------------------+------------------+ CCA Prox  47      13                                                   +----------+--------+--------+--------+------------------+------------------+ CCA Distal51      12                                intimal thickening +----------+--------+--------+--------+------------------+------------------+ ICA Prox  383     45      40-59%  calcific                             +----------+--------+--------+--------+------------------+------------------+ ICA Distal154     18                                                   +----------+--------+--------+--------+------------------+------------------+ ECA       522     38              calcific                             +----------+--------+--------+--------+------------------+------------------+ +----------+--------+-------+----------------+-------------------+           PSV cm/sEDV cmsDescribe        Arm Pressure (mmHG) +----------+--------+-------+----------------+-------------------+ Subclavian218            Multiphasic, WNL                    +----------+--------+-------+----------------+-------------------+ +---------+--------+--+--------+--+---------+ VertebralPSV cm/s87EDV cm/s18Antegrade +---------+--------+--+--------+--+---------+  Left Carotid Findings: +----------+--------+--------+--------+------------------+--------+           PSV cm/sEDV cm/sStenosisPlaque DescriptionComments  +----------+--------+--------+--------+------------------+--------+ CCA Prox  108     13                                         +----------+--------+--------+--------+------------------+--------+ CCA Distal134     20      <50%    calcific                   +----------+--------+--------+--------+------------------+--------+ ICA Prox  71      9                                          +----------+--------+--------+--------+------------------+--------+ ICA Distal81      19                                         +----------+--------+--------+--------+------------------+--------+ ECA       137     15                                         +----------+--------+--------+--------+------------------+--------+ +----------+--------+--------+----------------+-------------------+  PSV cm/sEDV cm/sDescribe        Arm Pressure (mmHG) +----------+--------+--------+----------------+-------------------+ Subclavian224             Multiphasic, WNL                    +----------+--------+--------+----------------+-------------------+ +---------+--------+--+--------+--+---------+ VertebralPSV cm/s87EDV cm/s25Antegrade +---------+--------+--+--------+--+---------+ Left CEA site noted.  Summary: Right Carotid: Velocities in the right ICA are consistent with a 60-79%                stenosis. The ECA appears >50% stenosed. Calcific shadowing may                have prohibited higher velocities to be obtained. Left Carotid: Velocities in the left ICA are consistent with a 1-39% stenosis.               Non-hemodynamically significant plaque <50% noted in the CCA. Left               CEA site noted. Vertebrals:  Bilateral vertebral arteries demonstrate antegrade flow. Subclavians: Normal flow hemodynamics were seen in bilateral subclavian              arteries. *See table(s) above for measurements and observations.  Electronically signed by Pramod Sethi MD on 07/26/2021 at 1:37:38  PM.    Final    ECHOCARDIOGRAM COMPLETE BUBBLE STUDY  Result Date: 07/25/2021    ECHOCARDIOGRAM REPORT   Patient Name:   Wanda [name] Date of Exam: 07/25/2021 Medical Rec #:  [id number]           Height:       61.0 in Accession #:    [id number]          Weight:       172.0 lb Date of Birth:  09/30/1940            BSA:          1.771 m Patient Age:    81 years            BP:           150/72 mmHg Patient Gender: F                   HR:           95 bpm. Exam Location:  Inpatient Procedure: 2D Echo, Cardiac Doppler and Color Doppler Indications:    Stroke  History:        Patient has prior history of Echocardiogram examinations, most                 recent 09/20/2018. Risk Factors:Hypertension and Diabetes.  Sonographer:    Colleen Schwartz Referring Phys: [id number] EMILY B MULLEN IMPRESSIONS  1. Peak intra-cavitary gradient 60 mmHg at rest and 68 mmHg with Valsalva. Left ventricular ejection fraction, by estimation, is >75%. The left ventricle has hyperdynamic function. The left ventricle has no regional wall motion abnormalities. There is mild left ventricular hypertrophy. Left ventricular diastolic parameters were normal.  2. Right ventricular systolic function is normal. The right ventricular size is normal. There is normal pulmonary artery systolic pressure.  3. The mitral valve was not well visualized. Trivial mitral valve regurgitation. No evidence of mitral stenosis. Moderate mitral annular calcification.  4. The aortic valve was not well visualized. Aortic valve regurgitation is not visualized. No aortic stenosis is present.  5. The inferior vena cava is normal in size with greater than 50% respiratory variability, suggesting right atrial pressure of 3   mmHg. Conclusion(s)/Recommendation(s): No intracardiac source of embolism detected on this transthoracic study. Consider a transesophageal echocardiogram to exclude cardiac source of embolism if clinically indicated. FINDINGS  Left Ventricle: Peak  intra-cavitary gradient 60 mmHg at rest and 68 mmHg with Valsalva. Left ventricular ejection fraction, by estimation, is >75%. The left ventricle has hyperdynamic function. The left ventricle has no regional wall motion abnormalities. The left ventricular internal cavity size was normal in size. There is mild left ventricular hypertrophy. Left ventricular diastolic parameters were normal. Right Ventricle: The right ventricular size is normal. Right vetricular wall thickness was not well visualized. Right ventricular systolic function is normal. There is normal pulmonary artery systolic pressure. The tricuspid regurgitant velocity is 1.22 m/s, and with an assumed right atrial pressure of 3 mmHg, the estimated right ventricular systolic pressure is 9.0 mmHg. Left Atrium: Left atrial size was normal in size. Right Atrium: Right atrial size was normal in size. Pericardium: Trivial pericardial effusion is present. Presence of epicardial fat layer. Mitral Valve: The mitral valve was not well visualized. Moderate mitral annular calcification. Trivial mitral valve regurgitation. No evidence of mitral valve stenosis. Tricuspid Valve: The tricuspid valve is grossly normal. Tricuspid valve regurgitation is not demonstrated. No evidence of tricuspid stenosis. Aortic Valve: The aortic valve was not well visualized. Aortic valve regurgitation is not visualized. No aortic stenosis is present. Aortic valve peak gradient measures 7.4 mmHg. Pulmonic Valve: The pulmonic valve was not well visualized. Pulmonic valve regurgitation is not visualized. Aorta: The aortic root, ascending aorta, aortic arch and descending aorta are all structurally normal, with no evidence of dilitation or obstruction. Venous: The inferior vena cava is normal in size with greater than 50% respiratory variability, suggesting right atrial pressure of 3 mmHg. IAS/Shunts: The interatrial septum was not well visualized.  LEFT VENTRICLE PLAX 2D LVIDd:         4.00  cm   Diastology LVIDs:         1.80 cm   LV e' medial:    4.80 cm/s LV PW:         1.10 cm   LV E/e' medial:  12.8 LV IVS:        1.00 cm   LV e' lateral:   6.68 cm/s LVOT diam:     2.00 cm   LV E/e' lateral: 9.2 LV SV:         70 LV SV Index:   40 LVOT Area:     3.14 cm  IVC IVC diam: 1.40 cm LEFT ATRIUM             Index        RIGHT ATRIUM           Index LA diam:        3.70 cm 2.09 cm/m   RA Area:     13.90 cm LA Vol (A2C):   28.4 ml 16.03 ml/m  RA Volume:   34.10 ml  19.25 ml/m LA Vol (A4C):   26.5 ml 14.96 ml/m LA Biplane Vol: 28.1 ml 15.86 ml/m  AORTIC VALVE                 PULMONIC VALVE AV Area (Vmax): 3.58 cm     PV Vmax:       1.19 m/s AV Vmax:        136.00 cm/s  PV Peak grad:  5.7 mmHg AV Peak Grad:   7.4 mmHg LVOT Vmax:      155.00 cm/s LVOT   Vmean:     79.800 cm/s LVOT VTI:       0.224 m  AORTA Ao Root diam: 3.30 cm Ao Asc diam:  2.50 cm MITRAL VALVE                TRICUSPID VALVE MV Area (PHT): 5.97 cm     TR Peak grad:   6.0 mmHg MV Decel Time: 127 msec     TR Vmax:        122.00 cm/s MV E velocity: 61.20 cm/s MV A velocity: 124.00 cm/s  SHUNTS MV E/A ratio:  0.49         Systemic VTI:  0.22 m                             Systemic Diam: 2.00 cm Bridgette Christopher MD Electronically signed by Bridgette Christopher MD Signature Date/Time: 07/25/2021/5:43:26 PM    Final    MR BRAIN WO CONTRAST  Result Date: 07/25/2021 CLINICAL DATA:  Stroke follow-up. EXAM: MRI HEAD WITHOUT CONTRAST TECHNIQUE: Multiplanar, multiecho pulse sequences of the brain and surrounding structures were obtained without intravenous contrast. COMPARISON:  CT/CTA head and neck dated 1 day prior FINDINGS: Image quality is degraded by motion artifact particularly involving the axial SWI and axial T2 sequences. Brain: There is moderate-sized region of diffusion restriction and associated FLAIR signal abnormality centered in the right parietal lobe in the MCA distribution consistent with evolving acute to subacute infarct.  There are numerous additional patchy foci of diffusion restriction and FLAIR signal abnormality throughout both cerebral hemispheres involving the bilateral frontal lobes and right temporal lobe consistent with additional smaller acute to subacute infarcts. The SWI sequence is significantly motion degraded, but there is no definite hemorrhagic transformation. There is no definite acute intracranial hemorrhage or extra-axial fluid collection. Background parenchymal volume is normal for age. The ventricles are stable in size. There are a few small remote lacunar infarcts in the bilateral corona radiata/centrum semiovale and background advanced chronic white matter microangiopathy. There is no solid mass lesion.  There is no midline shift. Vascular: Normal flow voids. Skull and upper cervical spine: Normal marrow signal. Sinuses/Orbits: The paranasal sinuses are clear. Bilateral lens implants are in place. The globes and orbits are otherwise unremarkable. Other: None. IMPRESSION: 1. Image quality is significantly degraded by motion artifact. 2. Multiple evolving acute to subacute infarcts in both cerebral hemispheres (right more than left) as above with the largest infarct in the right parietal lobe in the MCA distribution corresponding to the finding on the prior noncontrast CT head. No definite hemorrhagic transformation. Electronically Signed   By: Peter  Noone M.D.   On: 07/25/2021 17:00   DG CHEST PORT 1 VIEW  Result Date: 07/25/2021 CLINICAL DATA:  An 81-year-old female presents for evaluation of fever, history of stroke. EXAM: PORTABLE CHEST 1 VIEW COMPARISON:  September 19, 2018 FINDINGS: EKG leads project over the chest. Cardiomediastinal contours and hilar structures are stable. Lungs are clear. No sign of effusion or pneumothorax on frontal radiograph. On limited assessment no acute skeletal process. IMPRESSION: No acute cardiopulmonary disease. Electronically Signed   By: Geoffrey  Wile M.D.   On:  07/25/2021 09:28   CT ANGIO HEAD NECK W WO CM W PERF (CODE STROKE)  Result Date: 07/24/2021 CLINICAL DATA:  81-year-old female code stroke. EXAM: CT ANGIOGRAPHY HEAD AND NECK CT PERFUSION BRAIN TECHNIQUE: Multidetector CT imaging of the head and neck was performed using the standard   protocol during bolus administration of intravenous contrast. Multiplanar CT image reconstructions and MIPs were obtained to evaluate the vascular anatomy. Carotid stenosis measurements (when applicable) are obtained utilizing NASCET criteria, using the distal internal carotid diameter as the denominator. Multiphase CT imaging of the brain was performed following IV bolus contrast injection. Subsequent parametric perfusion maps were calculated using RAPID software. RADIATION DOSE REDUCTION: This exam was performed according to the departmental dose-optimization program which includes automated exposure control, adjustment of the mA and/or kV according to patient size and/or use of iterative reconstruction technique. CONTRAST:  155m OMNIPAQUE IOHEXOL 350 MG/ML SOLN COMPARISON:  Brain MRI 09/19/2018.  Head CT 09/20/2018. FINDINGS: CT HEAD FINDINGS Brain: Cerebral volume not significantly changed since 2020. No midline shift, mass effect, or evidence of intracranial mass lesion. No ventriculomegaly. Patchy chronic bilateral cerebral white matter hypodensity. Cytotoxic edema right parietal lobe series 4, image 26. Subtle associated increased hypodensity in the right centrum semiovale white matter. ASPECTS 8. Other right MCA territory gray-white matter differentiation appears stable, maintained. No acute intracranial hemorrhage identified. Vascular: Calcified atherosclerosis at the skull base. No suspicious intracranial vascular hyperdensity. Skull: No acute osseous abnormality identified. Sinuses/Orbits: Visualized paranasal sinuses and mastoids are stable and well aerated. Other: Rightward gaze. Visualized scalp soft tissues are within  normal limits. Preliminary results were communicated to Dr. ARory Percyat 11:38 am on 07/24/2021 by text page via the [facility]. [facility] Brain Perfusion Findings: ASPECTS: 8 CBF (<30%) Volume: 0 mL. No CBF or CBV parameter abnormalities detected. Perfusion (Tmax>6.0s) volume: 11 mL, some corresponding to the abnormal ASPECTS and some also in the right temporal lobe Mismatch Volume: Less than 11 mL Infarction Location:Right MCA territory is implicated CTA NECK Skeleton: No acute osseous abnormality identified. Age-appropriate cervical spine degeneration. Upper chest: Negative upper lungs and visible mediastinum. Other neck: Heterogeneously enlarged thyroid In the setting of significant comorbidities or limited life expectancy, no follow-up recommended (ref: J Am Coll Radiol. 2015 Feb;12(2): 143-50).Motion artifact. Aortic arch: Calcified aortic atherosclerosis. 3 vessel arch configuration. Right carotid system: Brachiocephalic artery and proximal right CCA plaque with no significant stenosis. Artifact obscures detail of the right carotid bifurcation where at least moderate atherosclerosis is present. At the level of the right ICA bulb high-grade stenosis is suspected approaching a radiographic string sign series 16, image 193. But the right ICA remains patent to the skull base. Left carotid system: Left CCA origin plaque and additional soft and calcified plaque before the bifurcation without stenosis. Motion artifact but better visualization of the proximal left ICA. No hemodynamically significant stenosis. Evidence of previous left carotid endarterectomy. Vertebral arteries: Tortuosity and atherosclerosis at the right subclavian artery origin with 60 % stenosis with respect to the distal vessel. Calcified plaque at the right vertebral artery origin with severe stenosis (series 17, image 89). Non dominant right vertebral artery with abundant additional atherosclerosis to the skull base, but the vessel remains  patent despite tandem stenoses. Proximal left subclavian atherosclerosis with less than 50 % stenosis with respect to the distal vessel. Calcified plaque at the left vertebral artery origin with up to moderate V1 segment stenosis. Dominant left vertebral artery with abundant additional calcified plaque in the neck, at least moderate P2 stenosis series 16, image 206, and moderate to severe V2 stenosis image 194. But the vessel remains patent to the skull base. CTA HEAD Posterior circulation: Diminutive and stenotic right vertebral V4 segment remains patent to the PICA origin, and possibly the vertebrobasilar junction. Dominant left vertebral V4 segment with  mild to moderate stenosis at the cisterna magna, but remains patent and supplies the basilar. Left PICA origin is patent. Patent basilar artery without stenosis. Patent basilar tip, SCA and PCA origins. Mild bilateral PCA origin stenosis. Mild to moderate stenosis left PCA P2 segment. Other bilateral PCA branches are within normal limits. Anterior circulation: Both ICA siphons are patent. Left siphon calcified plaque with mild to moderate supraclinoid stenosis. Right siphon calcified plaque with only mild stenosis. Posterior communicating arteries are diminutive or absent. Patent carotid termini. Patent MCA and ACA origins. Dominant right and diminutive or absent left ACA A1 segments. Normal anterior communicating artery. Bilateral ACA branches are within normal limits. Left MCA M1 segment and bifurcation are patent without stenosis. Left MCA branches are within normal limits. Right MCA M1 segment and bifurcation are patent without stenosis. No right MCA branch occlusion or high-grade stenosis is identified. Venous sinuses: Early contrast timing, not evaluated. Anatomic variants: Dominant left and diminutive right vertebral arteries, dominant right and diminutive or absent left A1 segments. Review of the MIP images confirms the above findings IMPRESSION: 1.  Cytotoxic edema in the posterior Right MCA territory (ASPECTS 8) with no hemorrhage or mass effect. CTP does not detect infarct core, suggesting it might be subacute. And only small volume asymmetric T-max abnormality in the right hemisphere. 2. No right MCA branch occlusion identified. Motion artifact in the neck but suspicion of high-grade right ICA origin stenosis, probably RADIOGRAPHIC STRING SIGN. Right siphon calcified plaque but only mild stenosis. 3. Evidence of previous Left Carotid Endarterectomy. Mild to moderate left siphon stenosis. 4. Atherosclerotic and highly stenotic bilateral vertebral arteries, the left is dominant. Mild bilateral PCA origin and moderate Left PCA P2 stenoses. 5. Aortic Atherosclerosis (ICD10-I70.0). Salient findings discussed by telephone with Dr. Rory Percy on 07/24/2021 at 12:06 . Electronically Signed   By: Genevie Ann M.D.   On: 07/24/2021 12:08    Labs:  CBC: Recent Labs    03/16/21 1433 07/24/21 1122 07/24/21 1126 07/26/21 0834 07/27/21 0424  WBC 7.4 12.5*  --  8.0 7.8  HGB 13.4 14.4 13.9 13.9 13.0  HCT 40.4 39.7 41.0 39.3 36.8  PLT 281.0 269  --  260 240    COAGS: Recent Labs    07/24/21 1122  INR 1.0  APTT 22*    BMP: Recent Labs    03/16/21 1433 07/24/21 1122 07/24/21 1126 07/26/21 0834 07/27/21 0424  NA 136 137 137 138 143  K 3.8 3.5 3.4* 2.9* 3.9  CL 98 101 102 106 113*  CO2 26 18*  --  19* 22  GLUCOSE 292* 369* 384* 248* 207*  BUN 15 23 25* 15 12  CALCIUM 10.2 10.0  --  9.7 9.1  CREATININE 0.94 1.23* 0.90 0.77 0.82  GFRNONAA  --  44*  --  >60 >60    LIVER FUNCTION TESTS: Recent Labs    03/16/21 1433 07/24/21 1122 07/27/21 0424  BILITOT 0.6 1.1 0.9  AST 15 26 34  ALT 19 22 41  ALKPHOS 51 45 42  PROT 7.7 7.8 6.4*  ALBUMIN 4.5 4.3 3.2*    TUMOR MARKERS: No results for input(s): "AFPTM", "CEA", "CA199", "CHROMGRNA" in the last 8760 hours.  Assessment and Plan:  Right MCA infarct with symptomatic high-grade proximal  right carotid stenosis:   The patient has been approved for an image-guided diagnostic cerebral angiogram to further assess the extent of stenosis/calcification. Depending on the findings the patient may be a candidate for stent placement at a  later date.   NIR attempted to speak with the patient at the bedside to discuss the procedure. The patient firmly stated she wants to go home and does not want any more procedures. The patient refused to allow me to discuss/explain the procedure. The patient is not oriented/able to consent self. This was discussed with Dr. Sethi who would like defer to the family's wishes. He states there is no urgent indication for this procedure and the patient can undergo further work up as an outpatient. I called the patient's daughter Kathy Langan and left a voicemail requesting a phone call back.   IR will await phone call back from the patient's daughter.   Thank you for this interesting consult.  I greatly enjoyed meeting Wanda [name] and look forward to participating in their care.  A copy of this report was sent to the requesting provider on this date.  Electronically Signed:  , AGACNP-BC [phone] 07/27/2021, 10:29 AM   I spent a total of 20 Minutes    in face to face in clinical consultation, greater than 50% of which was counseling/coordinating care for diagnostic cerebral angiogram.   

## 2021-07-27 NOTE — Progress Notes (Signed)
STROKE TEAM PROGRESS NOTE   INTERVAL HISTORY Patient is lying in bed.  She was seen by vascular surgery Dr. Doren Custard who feels she is not a good candidate for carotid endarterectomy as she has a long calcific plaque which extends way above the bifurcation and she is not a good candidate for TCAR either.  Plan to consult neuro interventional radiology.  Vital signs stable.  Neurological exam unchanged Vitals:   07/26/21 2340 07/27/21 0356 07/27/21 0738 07/27/21 1221  BP: (!) 144/90 (!) 146/80 (!) 184/84 (!) 180/82  Pulse: (!) 110 78 (!) 108 99  Resp: 18 20    Temp: 98.7 F (37.1 C) 98.7 F (37.1 C) 97.7 F (36.5 C) (!) 97.5 F (36.4 C)  TempSrc: Oral Oral Oral Oral  SpO2: 97% 95% 98% 98%  Weight:      Height:       CBC:  Recent Labs  Lab 07/26/21 0834 07/27/21 0424  WBC 8.0 7.8  NEUTROABS 5.4 5.0  HGB 13.9 13.0  HCT 39.3 36.8  MCV 86.4 88.0  PLT 260 829   Basic Metabolic Panel:  Recent Labs  Lab 07/26/21 0834 07/27/21 0424  NA 138 143  K 2.9* 3.9  CL 106 113*  CO2 19* 22  GLUCOSE 248* 207*  BUN 15 12  CREATININE 0.77 0.82  CALCIUM 9.7 9.1  MG 1.6* 2.0  PHOS  --  3.2   Lipid Panel:  Recent Labs  Lab 07/25/21 0441  CHOL 214*  TRIG 267*  HDL 27*  CHOLHDL 7.9  VLDL 53*  LDLCALC 134*   HgbA1c:  Recent Labs  Lab 07/25/21 0441  HGBA1C 11.1*   Urine Drug Screen: No results for input(s): "LABOPIA", "COCAINSCRNUR", "LABBENZ", "AMPHETMU", "THCU", "LABBARB" in the last 168 hours.  Alcohol Level  Recent Labs  Lab 07/24/21 1122  ETH <10    IMAGING past 24 hours No results found.  PHYSICAL EXAM GENERAL: Pleasant elderly Caucasian lady awake, alert, in no acute distress Psych: Affect appropriate for situation, patient is calm and cooperative with examination Head: Normocephalic and atraumatic, without obvious abnormality LUNGS: Normal respiratory effort. Non-labored breathing on room air Extremities: warm, well perfused, without obvious deformity   NEURO:   Mental Status:  Speech/Language: speech is slightly dysarthric but fluent.   Naming, repetition, fluency, and comprehension intact without aphasia  No neglect is noted Cranial Nerves:  II: PERRL Left visual field cut  III, IV, VI: EOMI. Lid elevation symmetric and full.  V: Sensation is intact to light touch and symmetrical to face.  VII: Face is symmetric resting and smiling.  VIII: Hearing intact to voice XII: Tongue protrudes midline without fasciculations.   Motor: 5/5 strength to RUE and RLE, 4/5 in LUE and LLE.  Weakness of left grip.  Diminished fine finger movements on the left.  Orbits right over left upper extremity. Tone is normal. Bulk is normal.  Sensation: Intact to light touch bilaterally in all four extremities. Extinction present on left side Gait: Deferred ASSESSMENT/PLAN Wanda [name] is a 81 y.o. 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 She was last seen well by her daughter at 37 on 7/15  and was found down on the morning of 7/16  by her son with left sided weakness and  right gaze deviation. Upon arrival to ED CT head showed loss of grey-white differentiation in the posterior right parietal lobe. Taken for CT angiography head and neck and perfusion studies.  No identifiable proximal intracranial large vessel occlusion.  Significant stenosis in the right ICA origin was suspected to likely be the culprit for her symptoms.Outside the window for IV thrombolysis. No emergent LVO for emergent thrombectomy.   She reportedly stopped taking her plavix in a self directed fashion 2 weeks prior to this event.   Subacute infarct of the right temporal lobe likely symptomatic from high-grade proximal right  ICA stenosis   with stroke work up ongoing  CT-scan of the brain: cytotoxic edema in right parietal lobe, ASPECTS 8  CTA/CTP: suspicion of high grade right ICA origin stenosis with possible radiographic string sign, postsurgical changes in left carotid, stenotic bilateral vertebral arteries, no infarct core and <11 mL penumbra  MRI Motion degraded study but multiple bilateral right greater than left MCA branch infarcts in parietal lobe  I will carotid Doppler calcified plaque at the right carotid bifurcation with 50 to 69% stenosis with velocities may be underestimated 2D Echo EF 75%.   LDL 134 HgbA1c 11.1 VTE prophylaxis -     Diet   Diet heart healthy/carb modified Room service appropriate? Yes; Fluid consistency: Thin  Plavix prescribed prior to admission but patient was not taking has a reported ASA allergy Recommend plavix alone daily at present  Continue telemetry monitoring Therapy recommendations:  IPR Disposition:  TBD  Hypertension Stable Permissive hypertension (OK if < 220/120) but gradually normalize in 5-7 days Long-term BP goal normotensive  Hyperlipidemia No home meds  LDL 134, goal < 70 High intensity statin is indicated with multiple reported statin intolerances. Please refer to lipid clinic.   Diabetes type II severely uncontrolled HgbA1c 11.1, goal < 7.0 CBGs Recent Labs    07/26/21 2132 07/27/21 0620 07/27/21 1255  GLUCAP 221* 253* 138*    [facility] PCP follow up to manage and improve diabetes control  Other Stroke Risk Factors Advanced Age >/= 76  Current ETOH use, alcohol level <10, advised to drink no more than one drink a day Obesity, Body mass index is 32.49 kg/m., BMI >/= 30 associated with increased stroke risk, recommend weight loss, diet and exercise as appropriate  Hx stroke High risk for Obstructive sleep apnea  Hospital day # 3   Patient presented to the left hemiparesis and brain imaging shows right MCA infarct with symptomatic  high-grade proximal right carotid stenosis.  Recommend vascular surgery consult for elective right carotid revascularization.  Continue Plavix alone for stroke prevention as patient has aspirin allergy. .  Physical occupational and speech therapy consults.  Discussed with Dr. Susie Cassette and Norma Fredrickson.  Follow-up as outpatient stroke clinic in 2 months.  Stroke team will sign off.  Can call for questions.  Greater than 50% time during this 35 minute visit was spent on counseling and coordination of care about stroke and symptomatic carotid stenosis and discussion about plans for possible carotid revascularization and answering questions.  Antony Contras, MD Medical Director [facility] Pager: [phone] 07/27/2021 1:21 PM  To contact Stroke Continuity provider, please refer to [email or web address] After hours, contact General Neurology

## 2021-07-27 NOTE — Progress Notes (Signed)
The patient's daughter called NIR back to discuss possible diagnostic cerebral angiogram. The patient's daughter is unsure if/when they would want to proceed either inpatient or outpatient.   I discussed the procedure with her and answered all her questions. She is going to discuss with her family and will communicate their decision with the patient's healthcare team.   I will leave the current order in place and NIR will continue to follow.  Soyla Dryer, [place] [phone] 07/27/2021, 5:08 PM

## 2021-07-27 NOTE — Progress Notes (Signed)
Occupational Therapy Treatment Patient Details Name: Wanda [name] MRN: [id number] DOB: Oct 30, 1940 Today's Date: 07/27/2021   History of present illness Pt is an 81 y.o. female presenting to [facility] ED on 07/24/2021 with left-sided weakness and right gaze deviation. CT + subacute infarct in R temporal lobe. MRI revealed multiple evolving acute to subacute infarcts in both cerebral  hemispheres (right more than left) with the largest infarct in the right parietal lobe in the MCA distribution consistent with prior CT. PMH includes: arthritis, carotid stenosis, DM2, HTN, urinary leakage, s/p L carotid endarterectomy, B TKA.   OT comments  Focus of session on self feeding at bed level with HOB up and locating favored food and drink items in L visual field with minimal verbal cues. Pt aware of need for BM. Rolled with +2 mod assist toward R for placement of bed pan. Pt with c/o neck pain and tendency to keep head laterally flexed and rotated to R, but demonstrated ability to perform full AROM. Positioned L UE supported by pillow. Continues to be appropriate for AIR.    Recommendations for follow up therapy are one component of a multi-disciplinary discharge planning process, led by the attending physician.  Recommendations may be updated based on patient status, additional functional criteria and insurance authorization.    Follow Up Recommendations  Acute inpatient rehab (3hours/day)    Assistance Recommended at Discharge Frequent or constant Supervision/Assistance  Patient can return home with the following  Two people to help with walking and/or transfers;A lot of help with bathing/dressing/bathroom;Assistance with cooking/housework;Assistance with feeding;Direct supervision/assist for medications management;Assist for transportation;Help with stairs or ramp for entrance;Direct supervision/assist for financial management   Equipment Recommendations  Other (comment) (defer to next venue)     Recommendations for Other Services Rehab consult    Precautions / Restrictions Precautions Precautions: Fall Precaution Comments: L neglect Restrictions Weight Bearing Restrictions: No       Mobility Bed Mobility Overal bed mobility: Needs Assistance Bed Mobility: Rolling Rolling: +2 for physical assistance, Mod assist         General bed mobility comments: light mod to roll to place bed pan    Transfers                         Balance                                           ADL either performed or assessed with clinical judgement   ADL Overall ADL's : Needs assistance/impaired Eating/Feeding: Minimal assistance;Bed level Eating/Feeding Details (indicate cue type and reason): placed favored food and drink on L side of tray, pt able to locate with verbal cues Grooming: Set up;Bed level;Wash/dry hands (wipes fingers off on right hand with napkin)                                      Extremity/Trunk Assessment Upper Extremity Assessment LUE Deficits / Details: pt able to flex and extend fingers and raise arm to place pillow, inconsistent reports as to whether she can feel it            Vision   Alignment/Gaze Preference: Gaze right;Head tilt Additional Comments: worked on locating visual targets on lunch tray in L hemispace   Perception Perception Perception:  (  pt asking if her styrofoam plate was cream)   Praxis      Cognition Arousal/Alertness: Awake/alert Behavior During Therapy: Flat affect Overall Cognitive Status: Impaired/Different from baseline Area of Impairment: Orientation, Memory, Following commands, Safety/judgement, Awareness, Problem solving, Attention                 Orientation Level: Disoriented to, Time Current Attention Level: Sustained Memory: Decreased short-term memory Following Commands: Follows one step commands with increased time, Follows one step commands  inconsistently Safety/Judgement: Decreased awareness of safety, Decreased awareness of deficits Awareness: Intellectual Problem Solving: Slow processing, Decreased initiation, Difficulty sequencing, Requires verbal cues, Requires tactile cues          Exercises      Shoulder Instructions       General Comments      Pertinent Vitals/ Pain       Pain Assessment Pain Assessment: Faces Faces Pain Scale: Hurts a little bit Pain Location: neck Pain Descriptors / Indicators: Discomfort, Sore Pain Intervention(s): Repositioned, Other (comment) (AROM)  Home Living                                          Prior Functioning/Environment              Frequency  Min 2X/week        Progress Toward Goals  OT Goals(current goals can now be found in the care plan section)  Progress towards OT goals: Progressing toward goals  Acute Rehab OT Goals Time For Goal Achievement: 08/08/21 Potential to Achieve Goals: Good  Plan Discharge plan remains appropriate    Co-evaluation                 AM-PAC OT "6 Clicks" Daily Activity     Outcome Measure   Help from another person eating meals?: A Little Help from another person taking care of personal grooming?: A Lot Help from another person toileting, which includes using toliet, bedpan, or urinal?: Total Help from another person bathing (including washing, rinsing, drying)?: A Lot Help from another person to put on and taking off regular upper body clothing?: A Lot Help from another person to put on and taking off regular lower body clothing?: Total 6 Click Score: 11    End of Session    OT Visit Diagnosis: Other symptoms and signs involving cognitive function;Muscle weakness (generalized) (M62.81);Hemiplegia and hemiparesis Hemiplegia - Right/Left: Left Hemiplegia - dominant/non-dominant: Non-Dominant Hemiplegia - caused by: Cerebral infarction   Activity Tolerance Patient tolerated treatment  well   Patient Left in bed;with call bell/phone within reach;with bed alarm set (on bed pan)   Nurse Communication          Time: 4008-6761 OT Time Calculation (min): 35 min  Charges: OT General Charges $OT Visit: 1 Visit OT Treatments $Self Care/Home Management : 23-37 mins  Cleta Alberts, OTR/L Acute Rehabilitation Services Office: [phone]  Malka So 07/27/2021, 3:55 PM

## 2021-07-27 NOTE — Progress Notes (Addendum)
Progress Note   Patient: Wanda [name] EGB:[id number] DOB: 1940/03/20 DOA: 07/24/2021     3 DOS: the patient was seen and examined on 07/27/2021   Brief hospital course: Lexxi Koslow was admitted with the working diagnosis of acute CVA.   81 y.o. female with medical history significant of HTN, DM2, h/o CVA s/p left endarterectomy of the carotid, and arthritis who presented after being found down.  Her daughter is present and adds to the history.  Patient was noted to be well at 5pm on Saturday the day PTA.  Next seen the following morning down and with left sided weakness.  The patient reports attempting to get up in the morning and falling against her dresser and then being found by her daughter.  She does not know if she felt weak at that time.  Daughter reports that patient has been forgetting to take her medications at time.  She is attempting to help her mother.  About 2 weeks ago, the patient decided to stop taking her plavix because she was not sure what it was doing for her.  She also has not been able to tolerate statins in the past.  She has a FH of CAD on her father's side.  She denies urinary symptoms, blood loss, chest pain or cough.   Na 137, K 3,5 Cl 101 bicarbonate 18, glucose 369, bun 23, cr 1,23 anion gap 18  Wbc 12.5 hgb 14.4 plt 269   Urine SG >1.046, negative protein, >500 glucose, negative leukocytes.  Sars covid 19 negative   Chest radiograph with no cardiomegaly, no infiltrates.   Head Ct with cytotoxic edema in the posterior right MCA territory, with no hemorrhage or mass effect. No right MCA branch occlusion. Suspected high grade stenosis right ICA.   Brain MRI with multiple evolving acute to subacute infarcts both cerebral hemispheres (right more than left). Largest infarct in the right parietal lobe in the MCA distribution.    Carotid US with right ICA 60 to 79% stenosis, ECA >50 % stenosis.  Left carotid with 1-39% in the ICA and non hemodynamic plaque  <50% in the CEA.   Vascular surgery has recommended maximal medical therapy or IR carotid stenting.   Ultimate plan for inpatient rehab.  Assessment and Plan: * Acute stroke due to ischemia [facility]) Patient continue to have right sided weakness more upper than lower extremity.   Plan to continue antiplatelet therapy with clopidogrel.  Patient allergic to aspirin.   Vascular surgery recommends against carotid endarterectomy, possible medical therapy only and or stenting per IR. Plan to follow up with neurology recommendations. Continue with Pt and Ot, Inpatient rehab when medically stable.   Metabolic encephalopathy patient with confusion and mild agitation this am. Last night she had IV lorazepam.  Will add trazodone at night for sleep.   Fever Negative covid UA not c/w UTI CXR without acute cardiopulm disease Resolved, follow  Essential hypertension Continue close blood pressure monitoring.  Systolic blood pressure 371 to 180 mmHg.  At home patient on clonidine and amlodipine Will resume amlodipine but will continue holding clonidine and HCTZ to avoid hypotension in the setting of carotid stenosis and acute CVA.  Hyperlipidemia with target LDL less than 70 LDL 134 Statin intolerance Needs outpatient follow up with lipid clinic   Diabetes mellitus Uncontrolled hyperglycemia with fasting glucose at 207. Capillary 214, 221 and 253.  Will increase  insulin sliding scale for glucose cover and monitoring Continue basal insulin 20 units and pre  meal 3 units.   Multinodular goiter (nontoxic) TSH wnl  Primary osteoarthritis of left knee Pain control   Hypokalemia Replace and follow  Hypomagnesemia Replace and follow  Obesity (BMI 30-39.9) noted      Subjective: Patient willing to go home, but at the same time [facility] for inpatient rehab, no chest pain and weakness is improving  Physical Exam: Vitals:   07/26/21 2032 07/26/21 2340 07/27/21 0356 07/27/21 0738  BP:  (!) 156/76 (!) 144/90 (!) 146/80 (!) 184/84  Pulse: 97 (!) 110 78 (!) 108  Resp: '18 18 20   '$ Temp: 97.8 F (36.6 C) 98.7 F (37.1 C) 98.7 F (37.1 C) 97.7 F (36.5 C)  TempSrc: Oral Oral Oral Oral  SpO2: 96% 97% 95% 98%  Weight:      Height:       Neurology awake and alert, decrease strength on the right side 3/5 upper and 4/5 lower extremity  ENT with no pallor Cardiovascular with S1 and S2 present and rhythmic with no gallops Respiratory with no rales or wheezing Abdomen not distended No lower extremity edema Data Reviewed:    Family Communication: no family at the bedside I spoke over the phone with the patient's wife about patient's  condition, plan of care, prognosis and all questions were addressed.   Disposition: Status is: Inpatient Remains inpatient appropriate because: acute CVA pending inpatient rehab   Planned Discharge Destination:  CIR      Author: Tawni Millers, MD 07/27/2021 12:04 PM  For on call review [email or web address].

## 2021-07-27 NOTE — Plan of Care (Signed)
  Problem: Skin Integrity: Goal: Risk for impaired skin integrity will decrease Outcome: Progressing   Problem: Clinical Measurements: Goal: Will remain free from infection Outcome: Progressing Goal: Respiratory complications will improve Outcome: Progressing   Problem: Activity: Goal: Risk for activity intolerance will decrease Outcome: Progressing   Problem: Coping: Goal: Level of anxiety will decrease Outcome: Progressing   

## 2021-07-28 DIAGNOSIS — E785 Hyperlipidemia, unspecified: Secondary | ICD-10-CM

## 2021-07-28 DIAGNOSIS — I639 Cerebral infarction, unspecified: Secondary | ICD-10-CM | POA: Diagnosis not present

## 2021-07-28 DIAGNOSIS — I10 Essential (primary) hypertension: Secondary | ICD-10-CM | POA: Diagnosis not present

## 2021-07-28 DIAGNOSIS — I6521 Occlusion and stenosis of right carotid artery: Secondary | ICD-10-CM | POA: Diagnosis not present

## 2021-07-28 DIAGNOSIS — M1712 Unilateral primary osteoarthritis, left knee: Secondary | ICD-10-CM

## 2021-07-28 DIAGNOSIS — E042 Nontoxic multinodular goiter: Secondary | ICD-10-CM | POA: Diagnosis not present

## 2021-07-28 DIAGNOSIS — E1169 Type 2 diabetes mellitus with other specified complication: Secondary | ICD-10-CM | POA: Diagnosis not present

## 2021-07-28 DIAGNOSIS — E669 Obesity, unspecified: Secondary | ICD-10-CM

## 2021-07-28 LAB — BASIC METABOLIC PANEL
Anion gap: 12 (ref 5–15)
BUN: 14 mg/dL (ref 8–23)
CO2: 19 mmol/L — ABNORMAL LOW (ref 22–32)
Calcium: 9.5 mg/dL (ref 8.9–10.3)
Chloride: 107 mmol/L (ref 98–111)
Creatinine, Ser: 0.83 mg/dL (ref 0.44–1.00)
GFR, Estimated: >60 mL/min (ref >60)
Glucose, Bld: 374 mg/dL — ABNORMAL HIGH (ref 70–99)
Potassium: 3.9 mmol/L (ref 3.5–5.1)
Sodium: 138 mmol/L (ref 135–145)

## 2021-07-28 LAB — GLUCOSE, CAPILLARY
Glucose-Capillary: 409 mg/dL — ABNORMAL HIGH (ref 70–99)
Glucose-Capillary: 356 mg/dL — ABNORMAL HIGH (ref 70–99)
Glucose-Capillary: 367 mg/dL — ABNORMAL HIGH (ref 70–99)
Glucose-Capillary: 249 mg/dL — ABNORMAL HIGH (ref 70–99)

## 2021-07-28 LAB — GLUCOSE, RANDOM: Glucose, Bld: 360 mg/dL — ABNORMAL HIGH (ref 70–99)

## 2021-07-28 MED ORDER — ENOXAPARIN SODIUM 40 MG/0.4ML IJ SOSY
40.0000 mg | PREFILLED_SYRINGE | INTRAMUSCULAR | Status: DC
  Administered 2021-07-28 – 2021-07-29 (×2): 40 mg via SUBCUTANEOUS
  Filled 2021-07-28 (×2): qty 0.4

## 2021-07-28 MED ORDER — GLIPIZIDE ER 5 MG PO TB24
5.0000 mg | ORAL_TABLET | Freq: Every day | ORAL | Status: DC
  Administered 2021-07-28 – 2021-07-29 (×2): 5 mg via ORAL
  Filled 2021-07-28 (×3): qty 1

## 2021-07-28 NOTE — TOC Progression Note (Signed)
Transition of Care [facility]) - Progression Note    Patient Details  Name: Wanda [name] MRN: [id number] Date of Birth: Dec 16, 1940  Transition of Care [facility]) CM/SW [place], [place] Phone Number: 07/28/2021, 3:30 PM  Clinical Narrative:     CSW left a VM for patients daughter to complete assessment. TOC will continue to follow for discharge planning.       Expected Discharge Plan and Services                                                 Social Determinants of Health (SDOH) Interventions    Readmission Risk Interventions     No data to display

## 2021-07-28 NOTE — Progress Notes (Signed)
   VASCULAR SURGERY ASSESSMENT & PLAN:   RIGHT CAROTID STENOSIS: This patient has a symptomatic 60 to 79% right carotid stenosis.  The plaque extends very high and is not surgically accessible.  In addition for multiple reasons the patient is not a candidate for TCAR.  Agree with maximal medical therapy.  The only other consideration is to see if interventional radiology feels that transfemoral carotid stenting is an option.   SUBJECTIVE:   No complaints this morning.  She seems more oriented.  PHYSICAL EXAM:   Vitals:   07/27/21 1825 07/27/21 1920 07/27/21 2342 07/28/21 0300  BP: (!) 176/87 (!) 165/86 (!) 164/69 (!) 158/72  Pulse: 100 (!) 105 90 (!) 101  Resp: '20 18 18 16  '$ Temp: 98 F (36.7 C) 98.4 F (36.9 C) 98.7 F (37.1 C) 98.6 F (37 C)  TempSrc: Oral Oral Oral Oral  SpO2: 96% 96% 93%   Weight:      Height:       Still with significant left upper extremity weakness.  LABS:   Lab Results  Component Value Date   WBC 7.8 07/27/2021   HGB 13.0 07/27/2021   HCT 36.8 07/27/2021   MCV 88.0 07/27/2021   PLT 240 07/27/2021   Lab Results  Component Value Date   CREATININE 0.83 07/28/2021   Lab Results  Component Value Date   INR 1.0 07/24/2021   CBG (last 3)  Recent Labs    07/27/21 1255 07/27/21 2102 07/28/21 0633  GLUCAP 138* 250* 409*    PROBLEM LIST:    Principal Problem:   Acute stroke due to ischemia [facility]) Active Problems:   Essential hypertension   Hyperlipidemia with target LDL less than 70   Obesity (BMI 30-39.9)   Diabetes mellitus   Multinodular goiter (nontoxic)   Primary osteoarthritis of left knee   Fever   Hypomagnesemia   Hypokalemia   CURRENT MEDS:    amLODipine  5 mg Oral Daily   clopidogrel  75 mg Oral Daily   heparin  5,000 Units Subcutaneous Q8H   insulin aspart  0-20 Units Subcutaneous TID WC   insulin aspart  0-5 Units Subcutaneous QHS   insulin aspart  3 Units Subcutaneous TID WC   insulin glargine-yfgn  25 Units  Subcutaneous Daily   traZODone  50 mg Oral QHS    Deitra Mayo Office: [phone] 07/28/2021

## 2021-07-28 NOTE — Progress Notes (Addendum)
Progress Note   Patient: Wanda [name] AOZ:[id number] DOB: Jul 12, 1940 DOA: 07/24/2021     4 DOS: the patient was seen and examined on 07/28/2021   Brief hospital course: Wanda [name] was admitted with the working diagnosis of acute CVA.   81 y.o. female with medical history significant of HTN, DM2, h/o CVA s/p left endarterectomy of the carotid, and arthritis who presented after being found down.  Her daughter is present and adds to the history.  Patient was noted to be well at 5pm on Saturday the day PTA.  Next seen the following morning down and with left sided weakness.  The patient reports attempting to get up in the morning and falling against her dresser and then being found by her daughter.  She does not know if she felt weak at that time.  Daughter reports that patient has been forgetting to take her medications at time.  She is attempting to help her mother.  About 2 weeks ago, the patient decided to stop taking her plavix because she was not sure what it was doing for her.  She also has not been able to tolerate statins in the past.  She has a FH of CAD on her father's side.  She denies urinary symptoms, blood loss, chest pain or cough.   Na 137, K 3,5 Cl 101 bicarbonate 18, glucose 369, bun 23, cr 1,23 anion gap 18  Wbc 12.5 hgb 14.4 plt 269   Urine SG >1.046, negative protein, >500 glucose, negative leukocytes.  Sars covid 19 negative   Chest radiograph with no cardiomegaly, no infiltrates.   Head Ct with cytotoxic edema in the posterior right MCA territory, with no hemorrhage or mass effect. No right MCA branch occlusion. Suspected high grade stenosis right ICA.   Brain MRI with multiple evolving acute to subacute infarcts both cerebral hemispheres (right more than left). Largest infarct in the right parietal lobe in the MCA distribution.    Carotid US with right ICA 60 to 79% stenosis, ECA >50 % stenosis.  Left carotid with 1-39% in the ICA and non hemodynamic plaque  <50% in the CEA.   Vascular surgery has recommended maximal medical therapy or IR carotid stenting.   Ultimate plan for inpatient rehab.  Assessment and Plan: * Acute stroke due to ischemia [facility]) Patient continue to have right sided weakness more upper than lower extremity.   Plan to continue antiplatelet therapy with clopidogrel.  Patient allergic to aspirin.   Vascular surgery recommends against carotid endarterectomy, possible medical therapy only and or stenting per IR.  Her family has been contacted per IR for appropriate timing for endovascular intervention.   Continue with Pt and Ot, Inpatient rehab when medically stable.   Metabolic encephalopathy. Today her confusion has resolved Continue with trazodone at night for sleep.  Avoid benzodiazepines.  Out of bed to chair tid with meals.    Fever Negative covid UA not c/w UTI CXR without acute cardiopulm disease Resolved, follow  Essential hypertension Her blood pressure has improved with systolic in the 962 mmHg range.   At home patient on amlodipine, clonidine and HCTZ.  Continue amlodipine for now, avoid hypotension in the setting of severe carotid artery disease.   Hyperlipidemia with target LDL less than 70 LDL 134 Statin intolerance Needs outpatient follow up with lipid clinic   Type 2 diabetes mellitus with hyperlipidemia ([place]) Uncontrolled hyperglycemia with fasting glucose > 400 mg/dl   Plan to increase basal insulin to 25 units, and  add pre meal insulin 3 units. Resume glipizide 5 mg daily, patient intolerant to metformin.   Continue with insulin sliding scale for glucose cover and monitoring.   Multinodular goiter (nontoxic) TSH wnl  Primary osteoarthritis of left knee Pain control   Hypokalemia Hypomagnesemia.   Renal function stable with serum cr at 0,83, K is 3,9 and serum bicarbonate at 19. Anion gap is 12.  Follow up renal function in am.   Obesity (BMI 30-39.9) Obesity class 1  with calculated BMI 32.4         Subjective: Patient with improved confusion, no agitation.   Physical Exam: Vitals:   07/27/21 1920 07/27/21 2342 07/28/21 0300 07/28/21 0734  BP: (!) 165/86 (!) 164/69 (!) 158/72 (!) 155/66  Pulse: (!) 105 90 (!) 101 90  Resp: '18 18 16   '$ Temp: 98.4 F (36.9 C) 98.7 F (37.1 C) 98.6 F (37 C) 98 F (36.7 C)  TempSrc: Oral Oral Oral Oral  SpO2: 96% 93%  95%  Weight:      Height:       Neurology awake and alert. Continue to have left upper extremity weakness 3/5 and left lower extremity 4/5, preserved strength on the right.  ENT with mild pallor Cardiovascular with S1 and S2 present and rhythmic no gallops Respiratory with no rales or wheezing Abdomen not distended No lower extremity edema  Data Reviewed:    Family Communication: no family at the bedside   Disposition: Status is: Inpatient Remains inpatient appropriate because: pending CIR   Planned Discharge Destination: Rehab      Author: Tawni Millers, MD 07/28/2021 9:39 AM  For on call review [email or web address].

## 2021-07-28 NOTE — Progress Notes (Signed)
Physical Therapy Treatment Patient Details Name: Wanda [name] MRN: [id number] DOB: 1940-02-15 Today's Date: 07/28/2021   History of Present Illness Pt is an 81 y.o. female presenting to [facility] ED on 07/24/2021 with left-sided weakness and right gaze deviation. CT + subacute infarct in R temporal lobe. MRI revealed multiple evolving acute to subacute infarcts in both cerebral  hemispheres (right more than left) with the largest infarct in the right parietal lobe in the MCA distribution consistent with prior CT. PMH includes: arthritis, carotid stenosis, DM2, HTN, urinary leakage, s/p L carotid endarterectomy, B TKA.    PT Comments    Focus this session on standing tolerance, improving pts ability to locate midline, and increasing attention to L side. Pt demonstrating improvement in ability to look to L side however still requires multimodal cueing to locate objects on L side and attend to LUE. Requiring modA for bed mobility and modA+2 for STS transfers with 2 person HHA and with Stedy. Utilized the stedy in front of the mirror for visual feedback for pt regarding midline. Remains limited by L neglect and decreased function in LUE. Pt is making good progress toward functional goals and PT will continue to follow while inpatient. AIR remains most appropriate discharge disposition to maximize functional independence.     Recommendations for follow up therapy are one component of a multi-disciplinary discharge planning process, led by the attending physician.  Recommendations may be updated based on patient status, additional functional criteria and insurance authorization.  Follow Up Recommendations  Acute inpatient rehab (3hours/day)     Assistance Recommended at Discharge Frequent or constant Supervision/Assistance  Patient can return home with the following Two people to help with walking and/or transfers;Two people to help with bathing/dressing/bathroom;Assistance with cooking/housework;Assist  for transportation;Direct supervision/assist for medications management   Equipment Recommendations  Other (comment) (TBD by next level of care)    Recommendations for Other Services       Precautions / Restrictions Precautions Precautions: Fall Precaution Comments: L neglect Restrictions Weight Bearing Restrictions: No     Mobility  Bed Mobility Overal bed mobility: Needs Assistance Bed Mobility: Supine to Sit     Supine to sit: Mod assist     General bed mobility comments: able to initiate LE movement toward EOB with tactile cues to push through LUE and use of bed rails, able to assist to bring trunk upright. Bed pad assist to scoot hips    Transfers Overall transfer level: Needs assistance Equipment used: 2 person hand held assist, Ambulation equipment used Transfers: Sit to/from Stand, Bed to chair/wheelchair/BSC Sit to Stand: Mod assist, +2 physical assistance           General transfer comment: Able to stand from EOB with 2 person HHA and multimodal cueing for hip extension. Difficulty marching in place. 2nd trial used the stedy and pt able to grasp steady with BUE to pull with continue cueing for hip ext. multiple STS attempts from stedy at sink with visual cues for finding midline Transfer via Lift Equipment: Stedy  Ambulation/Gait                   Stairs             Wheelchair Mobility    Modified Rankin (Stroke Patients Only)       Balance Overall balance assessment: Needs assistance Sitting-balance support: Bilateral upper extremity supported, Feet supported Sitting balance-Leahy Scale: Fair Sitting balance - Comments: mutimodal cueing for finding midline, 5 reps of leaning on  to LUE to promote weightbearing and then finding midline again   Standing balance support: Bilateral upper extremity supported, Single extremity supported Standing balance-Leahy Scale: Poor Standing balance comment: requiring 2 person assist to maintain  balance, able to wash hands at sink with stedy but requiring external support                            Cognition Arousal/Alertness: Awake/alert Behavior During Therapy: Flat affect Overall Cognitive Status: Impaired/Different from baseline Area of Impairment: Memory, Following commands, Safety/judgement, Problem solving                     Memory: Decreased short-term memory Following Commands: Follows one step commands with increased time Safety/Judgement: Decreased awareness of safety, Decreased awareness of deficits   Problem Solving: Slow processing, Decreased initiation, Difficulty sequencing, Requires verbal cues, Requires tactile cues General Comments: increased time to initiate movement and requiring frequent cueing throughout        Exercises      General Comments        Pertinent Vitals/Pain Pain Assessment Pain Assessment: No/denies pain    Home Living                          Prior Function            PT Goals (current goals can now be found in the care plan section) Acute Rehab PT Goals Patient Stated Goal: Go home PT Goal Formulation: With patient Time For Goal Achievement: 08/01/21 Potential to Achieve Goals: Good Progress towards PT goals: Progressing toward goals    Frequency    Min 4X/week      PT Plan Current plan remains appropriate    Co-evaluation              AM-PAC PT "6 Clicks" Mobility   Outcome Measure  Help needed turning from your back to your side while in a flat bed without using bedrails?: A Lot Help needed moving from lying on your back to sitting on the side of a flat bed without using bedrails?: A Lot Help needed moving to and from a bed to a chair (including a wheelchair)?: Total Help needed standing up from a chair using your arms (e.g., wheelchair or bedside chair)?: A Lot Help needed to walk in hospital room?: Total Help needed climbing 3-5 steps with a railing? : Total 6  Click Score: 9    End of Session Equipment Utilized During Treatment: Gait belt Activity Tolerance: Patient tolerated treatment well;Patient limited by fatigue Patient left: in chair;with call bell/phone within reach;with chair alarm set;with nursing/sitter in room Nurse Communication: Mobility status PT Visit Diagnosis: Unsteadiness on feet (R26.81);Difficulty in walking, not elsewhere classified (R26.2);Other symptoms and signs involving the nervous system (R29.898)     Time: 1202-1230 PT Time Calculation (min) (ACUTE ONLY): 28 min  Charges:  $Gait Training: 8-22 mins $Neuromuscular Re-education: 8-22 mins                     Mackie Pai, SPT Acute Rehabilitation Services  Office: [place] 07/28/2021, 1:19 PM

## 2021-07-28 NOTE — NC FL2 (Signed)
[place] LEVEL OF CARE SCREENING TOOL     IDENTIFICATION  Patient Name: Wanda [name] Birthdate: 1941-01-08 Sex: female Admission Date (Current Location): 07/24/2021  [facility] and [place] Number:  Herbalist and Address:  [facility] [place], [place] [address], [place], [place] [address]      Provider Number: [id number]  Attending Physician Name and Address:  Tawni Millers  Relative Name and Phone Number:       Current Level of Care: Hospital Recommended Level of Care: [place] Prior Approval Number:    Date Approved/Denied:   PASRR Number: [id number]  Discharge Plan: SNF    Current Diagnoses: Patient Active Problem List   Diagnosis Date Noted   Hypokalemia 07/26/2021   Fever 07/25/2021   Acute stroke due to ischemia ([place]) 07/24/2021   Carotid stenosis 11/13/2018   CVA (cerebral vascular accident) ([place]) 09/19/2018   Brainstem infarct, acute ([place])    Primary osteoarthritis of left knee 10/04/2015   Osteoporosis 08/23/2015   Multinodular goiter (nontoxic) 04/07/2014   Type 2 diabetes mellitus with hyperlipidemia ([place]) 10/09/2013   Insulin adverse reaction 09/27/2013   Senile nuclear sclerosis 08/14/2012   Essential hypertension 03/10/2011   Hyperlipidemia with target LDL less than 70 03/10/2011   Obesity (BMI 30-39.9) 03/10/2011    Orientation RESPIRATION BLADDER Height & Weight     Self, Time, Situation, Place  Normal Continent Weight: 171 lb 15.3 oz (78 kg) Height:  '5\' 1"'$  (154.9 cm)  BEHAVIORAL SYMPTOMS/MOOD NEUROLOGICAL BOWEL NUTRITION STATUS      Continent Diet (heart healthy/carb modified)  AMBULATORY STATUS COMMUNICATION OF NEEDS Skin   Extensive Assist Verbally Normal                       Personal Care Assistance Level of Assistance  Bathing, Feeding, Dressing Bathing Assistance: Maximum assistance Feeding assistance: Limited assistance Dressing Assistance: Maximum  assistance     Functional Limitations Info  Sight Sight Info: Impaired        SPECIAL CARE FACTORS FREQUENCY  PT (By licensed PT), OT (By licensed OT)     PT Frequency: 5x/wk OT Frequency: 5x/wk            Contractures Contractures Info: Not present    Additional Factors Info  Code Status, Allergies, Insulin Sliding Scale Code Status Info: Full Allergies Info: Ace Inhibitors, Alendronate Sodium, Aspirin, Crestor (Rosuvastatin), Lipitor (Atorvastatin Calcium), Metformin And Related, Shellfish Allergy   Insulin Sliding Scale Info: see DC summary       Current Medications (07/28/2021):  This is the current hospital active medication list Current Facility-Administered Medications  Medication Dose Route Frequency Provider Last Rate Last Admin   acetaminophen (TYLENOL) tablet 650 mg  650 mg Oral Q4H PRN Sid Falcon, MD       Or   acetaminophen (TYLENOL) 160 MG/5ML solution 650 mg  650 mg Per Tube Q4H PRN Sid Falcon, MD       Or   acetaminophen (TYLENOL) suppository 650 mg  650 mg Rectal Q4H PRN Sid Falcon, MD       amLODipine (NORVASC) tablet 5 mg  5 mg Oral Daily Arrien, Jimmy Picket, MD   5 mg at 07/28/21 1056   clopidogrel (PLAVIX) tablet 75 mg  75 mg Oral Daily Gilles Chiquito B, MD   75 mg at 07/28/21 1056   enoxaparin (LOVENOX) injection 40 mg  40 mg Subcutaneous Q24H Arrien, Jimmy Picket, MD  40 mg at 07/28/21 1056   glipiZIDE (GLUCOTROL XL) 24 hr tablet 5 mg  5 mg Oral Q breakfast Arrien, Jimmy Picket, MD   5 mg at 07/28/21 1057   insulin aspart (novoLOG) injection 0-20 Units  0-20 Units Subcutaneous TID WC Gilles Chiquito B, MD   5 Units at 07/28/21 1230   insulin aspart (novoLOG) injection 0-5 Units  0-5 Units Subcutaneous QHS Sid Falcon, MD   2 Units at 07/27/21 2122   insulin aspart (novoLOG) injection 3 Units  3 Units Subcutaneous TID WC Elodia Florence., MD   3 Units at 07/28/21 1231   insulin glargine-yfgn (SEMGLEE) injection 25  Units  25 Units Subcutaneous Daily Tawni Millers, MD   25 Units at 07/28/21 1056   senna-docusate (Senokot-S) tablet 1 tablet  1 tablet Oral QHS PRN Sid Falcon, MD       traZODone (DESYREL) tablet 50 mg  50 mg Oral QHS Tawni Millers, MD   50 mg at 07/27/21 2123     Discharge Medications: Please see discharge summary for a list of discharge medications.  Relevant Imaging Results:  Relevant Lab Results:   Additional Information SS#: [id number]  Geralynn Ochs, LCSW

## 2021-07-28 NOTE — Progress Notes (Addendum)
Inpatient Rehabilitation Admissions Coordinator   I do not have a CIR bed to pursue admit this week for this patient. I left her daughter a voicemail of this information. Acute team and TOC made aware that other rehab [facility] should be pursued. We will sign off.  Danne Baxter, RN, MSN Rehab Admissions Coordinator [phone] 07/28/2021 12:05 PM

## 2021-07-28 NOTE — Progress Notes (Signed)
Inpatient Diabetes Program Recommendations  AACE/ADA: New Consensus Statement on Inpatient Glycemic Control (2015)  Target Ranges:  Prepandial:   less than 140 mg/dL      Peak postprandial:   less than 180 mg/dL (1-2 hours)      Critically ill patients:  140 - 180 mg/dL   Lab Results  Component Value Date   GLUCAP 409 (H) 07/28/2021   HGBA1C 11.1 (H) 07/25/2021    Review of Glycemic Control   Diabetes history: DM 2 Outpatient Diabetes medications: Lantus 35 units Daily, Glipizide 10 mg Daily, 5 mg qpm Glucotrol 5-10 mg daily, Lantus 35 units daily (ordered 20 units Lantus) Current orders for Inpatient glycemic control:  Novolog 0-20 tid + HS Semglee 25 units Daily Novolog 3 units tid meal coverage  A1c 11.1% on 7/17  Inpatient Diabetes Program Recommendations:    -  Consider increasing Semglee to 30 units -  Consider Increasing Novolog meal coverage to 8 units tid if eating >50% of meals  Thanks,  Tama Headings RN, MSN, BC-ADM Inpatient Diabetes Coordinator Team Pager [phone] (8a-5p)

## 2021-07-28 NOTE — Progress Notes (Signed)
Speech Language Pathology Treatment: Cognitive-Linquistic  Patient Details Name: Wanda [name] MRN: [id number] DOB: [birth year] Today's Date: 07/28/2021 Time: 8416-6063 SLP Time Calculation (min) (ACUTE ONLY): 20 min  Assessment / Plan / Recommendation Clinical Impression  Patient seen by SLP for skilled treatment focused on cognition. Patient in recliner with eyes closed when SLP entered the room but she did awaken to voice. She slowly woke up during conversation with SLP. She was oriented to place, month, year, general situation. She was able to answer biographical/personal questions promptly, telling SLP that she has lately not been very active and mainly spends her time cleaning in her apartment, doing crossword puzzles and jigsaw puzzles. She was able to recall PT session when SLP provided context but only able to describe general activities. She reported that currently, even working on standing with PT "tires me out". She was able to recall and describe working on looking to left but required moderate verbal and visual cues to scan to left to see objects such as flowers her husband had brought her. Patient was not demonstrating any spontaneous movement of her left arm/hand and was not exhibiting any awareness to it until SLP cued her. She then demonstrated how she could move her shoulder and hand a little. SLP encouraged patient to frequently scan to left and look at left visual field as well as to look at  her hand/arm while she is moving it to give more input to her brain. Patient will continue to benefit from skilled SLP intervention for cognitive function goals and will benefit from SLP intervention at next venue of care.   HPI HPI: Pt is a 81 y.o. female who presented after being found down and with L sided weakness. Daughter reports that patient has been forgetting to take her medications at times. CTA of the head and neck (7/16)  showed "Cytotoxic edema in the posterior Right MCA territory  with no hemorrhage or mass effect. CTP does not detect infarct core,  suggesting it might be subacute". MRI pending. Seen by acute SLP 09/2018 for tx of dysphagia, with swallow function WFL noted via MBS (09/23/2018). PMH: HTN, DM2, h/o CVA s/p left endarterectomy of the carotid, arthritis.      SLP Plan  Continue with current plan of care      Recommendations for follow up therapy are one component of a multi-disciplinary discharge planning process, led by the attending physician.  Recommendations may be updated based on patient status, additional functional criteria and insurance authorization.    Recommendations                   Follow Up Recommendations: Other (comment) (SLP at next venue of care (likely will be SNF vs AIR)) Assistance recommended at discharge: Frequent or constant Supervision/Assistance SLP Visit Diagnosis: Cognitive communication deficit (K16.010) Plan: Continue with current plan of care         Sonia Baller, MA, CCC-SLP Speech Therapy

## 2021-07-28 NOTE — Progress Notes (Signed)
VM received from daughter Miss Wanda [name] stating that her mother has agreed to proceed with the diagnostic cerebral angiogram.   Miss Wanda [name] seen at bedside.  She is sitting in a recliner, watching TV, NAD.   The benefit and risk of the diagnostic cerebral angiogram was explained in detail.  Patient has firm understanding why the procedure is recommended to the patient, but she states that she had a bad experience when she had "a procedure" last time long time ago.  She asks for more time to think about the procedure.   Informed the patient that she will be NPO at [facility] for possible procedure tomorrow, NIR will re-visit her 9 am tomorrow to discuss about the procedure one more time.  She agreeable with the plan.   Miss Wanda [name] called for update, asked if Miss Wanda [name] can be in the patient's room at 9 am tomorrow for further discussion.  Miss Wanda [name] states that she will be in the hospital at 9 AM tomorrow.   NIR will re-visit the patient again tomorrow. Please call IR for questions and concerns.   Wanda [name] Bennie Scaff PA-C 07/28/2021 3:57 PM

## 2021-07-29 DIAGNOSIS — R2689 Other abnormalities of gait and mobility: Secondary | ICD-10-CM | POA: Diagnosis not present

## 2021-07-29 DIAGNOSIS — I639 Cerebral infarction, unspecified: Secondary | ICD-10-CM | POA: Diagnosis not present

## 2021-07-29 DIAGNOSIS — E1165 Type 2 diabetes mellitus with hyperglycemia: Secondary | ICD-10-CM | POA: Diagnosis not present

## 2021-07-29 DIAGNOSIS — G8190 Hemiplegia, unspecified affecting unspecified side: Secondary | ICD-10-CM | POA: Diagnosis not present

## 2021-07-29 DIAGNOSIS — I63511 Cerebral infarction due to unspecified occlusion or stenosis of right middle cerebral artery: Secondary | ICD-10-CM | POA: Diagnosis not present

## 2021-07-29 DIAGNOSIS — R531 Weakness: Secondary | ICD-10-CM | POA: Diagnosis not present

## 2021-07-29 DIAGNOSIS — E1169 Type 2 diabetes mellitus with other specified complication: Secondary | ICD-10-CM | POA: Diagnosis not present

## 2021-07-29 DIAGNOSIS — I69354 Hemiplegia and hemiparesis following cerebral infarction affecting left non-dominant side: Secondary | ICD-10-CM | POA: Diagnosis not present

## 2021-07-29 DIAGNOSIS — E785 Hyperlipidemia, unspecified: Secondary | ICD-10-CM | POA: Diagnosis not present

## 2021-07-29 DIAGNOSIS — E119 Type 2 diabetes mellitus without complications: Secondary | ICD-10-CM | POA: Diagnosis not present

## 2021-07-29 DIAGNOSIS — Z794 Long term (current) use of insulin: Secondary | ICD-10-CM | POA: Diagnosis not present

## 2021-07-29 DIAGNOSIS — Z7401 Bed confinement status: Secondary | ICD-10-CM | POA: Diagnosis not present

## 2021-07-29 DIAGNOSIS — Z8673 Personal history of transient ischemic attack (TIA), and cerebral infarction without residual deficits: Secondary | ICD-10-CM | POA: Diagnosis not present

## 2021-07-29 DIAGNOSIS — I69398 Other sequelae of cerebral infarction: Secondary | ICD-10-CM | POA: Diagnosis not present

## 2021-07-29 DIAGNOSIS — I10 Essential (primary) hypertension: Secondary | ICD-10-CM | POA: Diagnosis not present

## 2021-07-29 DIAGNOSIS — G459 Transient cerebral ischemic attack, unspecified: Secondary | ICD-10-CM | POA: Diagnosis not present

## 2021-07-29 DIAGNOSIS — K30 Functional dyspepsia: Secondary | ICD-10-CM | POA: Diagnosis not present

## 2021-07-29 DIAGNOSIS — I69322 Dysarthria following cerebral infarction: Secondary | ICD-10-CM | POA: Diagnosis not present

## 2021-07-29 DIAGNOSIS — E876 Hypokalemia: Secondary | ICD-10-CM | POA: Diagnosis not present

## 2021-07-29 DIAGNOSIS — I63311 Cerebral infarction due to thrombosis of right middle cerebral artery: Secondary | ICD-10-CM | POA: Diagnosis not present

## 2021-07-29 LAB — GLUCOSE, CAPILLARY
Glucose-Capillary: 231 mg/dL — ABNORMAL HIGH (ref 70–99)
Glucose-Capillary: 181 mg/dL — ABNORMAL HIGH (ref 70–99)
Glucose-Capillary: 252 mg/dL — ABNORMAL HIGH (ref 70–99)

## 2021-07-29 LAB — BASIC METABOLIC PANEL
Anion gap: 10 (ref 5–15)
BUN: 24 mg/dL — ABNORMAL HIGH (ref 8–23)
CO2: 23 mmol/L (ref 22–32)
Calcium: 9.8 mg/dL (ref 8.9–10.3)
Chloride: 107 mmol/L (ref 98–111)
Creatinine, Ser: 0.87 mg/dL (ref 0.44–1.00)
GFR, Estimated: >60 mL/min (ref >60)
Glucose, Bld: 211 mg/dL — ABNORMAL HIGH (ref 70–99)
Potassium: 3.7 mmol/L (ref 3.5–5.1)
Sodium: 140 mmol/L (ref 135–145)

## 2021-07-29 MED ORDER — INSULIN GLARGINE-YFGN 100 UNIT/ML ~~LOC~~ SOLN: 30.0000 [IU] | Freq: Every day | SUBCUTANEOUS | 11 refills | Status: DC

## 2021-07-29 MED ORDER — INSULIN ASPART 100 UNIT/ML IJ SOLN
0.0000 [IU] | Freq: Every day | INTRAMUSCULAR | 11 refills | Status: DC
Start: 2021-07-29 — End: 2021-12-01

## 2021-07-29 MED ORDER — SENNOSIDES-DOCUSATE SODIUM 8.6-50 MG PO TABS: 1.0000 | ORAL_TABLET | Freq: Every evening | ORAL | Status: DC | PRN

## 2021-07-29 MED ORDER — INSULIN ASPART 100 UNIT/ML IJ SOLN: 3.0000 [IU] | Freq: Three times a day (TID) | INTRAMUSCULAR | 11 refills | Status: DC

## 2021-07-29 MED ORDER — INSULIN GLARGINE-YFGN 100 UNIT/ML ~~LOC~~ SOLN: 30.0000 [IU] | Freq: Every day | SUBCUTANEOUS | Status: DC

## 2021-07-29 MED ORDER — INSULIN ASPART 100 UNIT/ML IJ SOLN: 0.0000 [IU] | Freq: Three times a day (TID) | INTRAMUSCULAR | 11 refills | Status: DC

## 2021-07-29 NOTE — TOC Progression Note (Addendum)
Transition of Care [facility]) - Progression Note    Patient Details  Name: Wanda [name] MRN: [id number] Date of Birth: [birth year]  Transition of Care [facility]) CM/SW Contact  Zenon Mayo, RN Phone Number: 07/29/2021, 12:11 PM  Clinical Narrative:    NCM notified CIR is not taking patient due to [place] concern.  NCM spoke with patient daughter, Wanda [name] about trying [facility] ([facility]) she states yes she would like for patient to go to [facility].  NCM made referral to [facility].  She states she will just need to know when patient is ready to discharge.  NCM asked MD , awaiting answer so can get back with [name]. MD states he will be discharging patient today around 2 pm, NCM informed the Staff RN of this information. NCM called daughter and informed her of the 3pm transport.   NCM set up PTAR for 3 pm transport. Nurse to call report to [phone].  Expected Discharge Plan: IP Rehab Facility Barriers to Discharge: Continued Medical Work up  Expected Discharge Plan and Services Expected Discharge Plan: [place]   Discharge Planning Services: CM Consult Post Acute Care Choice: IP Rehab Living arrangements for the past 2 months: Single Family Home                           HH Arranged: NA           Social Determinants of Health (SDOH) Interventions    Readmission Risk Interventions     No data to display

## 2021-07-29 NOTE — Progress Notes (Signed)
Report given to Bedelia Person at [facility].

## 2021-07-29 NOTE — Progress Notes (Signed)
Inpatient Diabetes Program Recommendations  AACE/ADA: New Consensus Statement on Inpatient Glycemic Control (2015)  Target Ranges:  Prepandial:   less than 140 mg/dL      Peak postprandial:   less than 180 mg/dL (1-2 hours)      Critically ill patients:  140 - 180 mg/dL   Lab Results  Component Value Date   GLUCAP 231 (H) 07/29/2021   HGBA1C 11.1 (H) 07/25/2021    Review of Glycemic Control  Latest Reference Range & Units 07/28/21 06:33 07/28/21 12:03 07/28/21 16:47 07/28/21 21:18 07/29/21 07:57  Glucose-Capillary 70 - 99 mg/dL 409 (H) 356 (H) 367 (H) 249 (H) 231 (H)    Diabetes history: DM 2 Outpatient Diabetes medications: Lantus 35 units Daily, Glipizide 10 mg Daily, 5 mg qpm Glucotrol 5-10 mg daily, Lantus 35 units daily (ordered 20 units Lantus) Current orders for Inpatient glycemic control:  Novolog 0-20 tid + HS Semglee 25 units Daily Novolog 3 units tid meal coverage Glipizide 5 mg Daily  A1c 11.1% on 7/17  Inpatient Diabetes Program Recommendations:    Note trends lower today. Fasting glucose 231  -  Consider increasing Semglee to 30 units   Thanks,  Tama Headings RN, MSN, BC-ADM Inpatient Diabetes Coordinator Team Pager [phone] (8a-5p)

## 2021-07-29 NOTE — Discharge Summary (Signed)
Physician Discharge Summary  Wanda [name] SKA:[id number] DOB: 09/25/1940 DOA: 07/24/2021  PCP: Wanda Mclean, MD  Admit date: 07/24/2021 Discharge date: 07/29/2021  Admitted From: Home Discharge disposition: SNF  Brief narrative: Wanda [name] is a 81 y.o. female with PMH significant for DM2, HTN, HLD, h/o CVA s/p left endarterectomy of the carotid, and arthritis Patient was brought to the ED on 7/16 after she was noted to be down on the floor with left-sided weakness.  Patient reported attempting to get up in the morning and falling against her dresser and then being found by her daughter.  Patient apparently self stopped taking Plavix 2 weeks prior.  Unable to tolerate the statin in the past   CT head showed cytotoxic edema in the posterior right MCA territory, with no hemorrhage or mass effect.  CT angio head and neck and CT perfusion did not show right MCA branch occlusion but showed high grade stenosis right ICA.  Brain MRI showed multiple evolving acute to subacute infarcts both cerebral hemispheres (right more than left). Largest infarct in the right parietal lobe in the MCA distribution.   Carotid US showed right ICA 60 to 79% stenosis, ECA >50 % stenosis.  Left carotid with 1-39% in the ICA and non hemodynamic plaque <50% in the CEA.    Patient was admitted to hospitalist service Neurology and vascular surgery were consulted. See below for details. Currently pending placement.  Subjective: Patient was seen and examined this afternoon.  Pleasant elderly Caucasian female.  Not in distress. Chart reviewed. Blood pressure elevated in 150s Blood sugar level elevated to 31 this morning  Assessment and plan: Acute right parietal lobe MCA distribution stroke  Multiple acute/subacute infarction both cerebral hemispheres High-grade proximal right ICA stenosis -Multiple risk factors as mentioned above -Imaging findings as above -Seen by neurology and vascular  surgery. -Stroke work-up completed. -Recommended to continue antiplatelet therapy with Plavix.  Patient is allergic to aspirin. -Vascular surgery recommends against carotid endarterectomy, possible medical therapy only and or stenting per IR. -Per IR, her family has been contacted for appropriate timing for endovascular intervention. So far, family has refused any intervention. -PT eval obtained.  CIR versus SNF recommended.   Acute metabolic encephalopathy -Neuropsych meds adjusted.  Confusion improved.   -Continue trazodone nightly as needed.  Avoid benzodiazepines  -Out of bed to chair tid with meals.      Essential hypertension -PTA on clonidine, amlodipine, HCTZ  -Blood pressure is currently controlled on amlodipine only. -avoid hypotension in the setting of severe carotid artery disease.    Hyperlipidemia Statin intolerance -LDL 134, target less than 70 -Needs outpatient follow up with lipid clinic   Uncontrolled type 2 diabetes mellitus with hyperglycemia -A1c elevated to 11.1 on 07/25/2021 -PTA on Lantus 35 units Daily, Glipizide 10 mg Daily, 5 mg qpm -Currently on Semglee 25 units daily, NovoLog 3 units 3 times daily, glipizide 5 mg daily, sliding scale insulin with Accu-Cheks.  Blood sugar level remains elevated over 200 this morning.  Will increase Semglee to 30 units daily today. -Diabetes care coordinator consult appreciated. Recent Labs  Lab 07/28/21 1203 07/28/21 1647 07/28/21 2118 07/29/21 0757 07/29/21 1212  GLUCAP 356* 367* 249* 231* 181*   Nontoxic multinodular goiter -TSH wnl Recent Labs    03/16/21 1433 07/25/21 0441  TSH 1.78 0.803   Primary osteoarthritis of left knee -Continue pain control   Obesity -Body mass index is 32.49 kg/m. Patient has been advised to make an attempt to  improve diet and exercise patterns to aid in weight loss.  Wounds:  - Incision (Closed) 11/13/18 Neck Left (Active)  Date First Assessed/Time First Assessed: 11/13/18  1043   Location: Neck  Location Orientation: Left    Assessments 11/13/2018 11:10 AM 11/14/2018  7:36 AM  Dressing Type Liquid skin adhesive Liquid skin adhesive  Dressing Clean;Dry;Intact Clean;Dry;Intact  Site / Wound Assessment Clean;Dry Clean;Dry;Painful;Other (Comment)  Margins Attached edges (approximated) Attached edges (approximated)  Closure Skin glue Skin glue  Drainage Amount None None  Drainage Description No odor No odor     No associated orders.    Discharge Exam:   Vitals:   07/29/21 0039 07/29/21 0355 07/29/21 0759 07/29/21 1100  BP: 137/65 (!) 153/66 (!) 157/73 (!) 155/76  Pulse: 90 83 92 92  Resp: 20 (!) _0 Temp: 98 F (36.7 C) 98.1 F (36.7 C) 98.3 F (36.8 C) 98.4 F (36.9 C)  TempSrc: Oral Oral Oral Oral  SpO2: 94% 94% 96% 96%  Weight:      Height:        Body mass index is 32.49 kg/m.  General exam: Pleasant, elderly Caucasian female.  Not in physical distress Skin: No rashes, lesions or ulcers. HEENT: Atraumatic, normocephalic, no obvious bleeding Lungs: Clear to auscultation bilaterally CVS: Regular rate and rhythm, no murmur GI/Abd soft, nontender, nondistended, bowel sounds present CNS: Alert, awake, oriented to place and person Psychiatry: Mood appropriate Extremities: No pedal edema, no calf tenderness  Follow ups:    Follow-up Information     de Rosario Jacks, MD Follow up.   Specialties: Radiology, Interventional Radiology Why: Please call our office if you would like to arrange for diagnostic angiogram (as discussed during your hospitalization) with Dr. Karenann Cai. Contact information: [place] [address] [phone]         [facility] Follow up.   Specialty: Rehabilitation Why: IP rehab Contact information: [place]. [place] [phone]        Copland, Gay Filler, MD Follow up.   Specialty:  Family Medicine Contact information: [address] [place] [address] [phone]                 Discharge Instructions:   Discharge Instructions     AMB Referral to Advanced Lipid Disorders Clinic   Complete by: As directed    Internal Lipid Clinic Referral Scheduling  Internal lipid clinic referrals are providers within [facility], who wish to refer established patients for routine management (help in starting PCSK9 inhibitor therapy) or advanced therapies.  Internal MD referral criteria:              1. All patients with LDL>190 mg/dL  2. All patients with Triglycerides >500 mg/dL  3. Patients with suspected or confirmed heterozygous familial hyperlipidemia (HeFH) or homozygous familial hyperlipidemia (HoFH)  4. Patients with family history of suspicious for genetic dyslipidemia desiring genetic testing  5. Patients refractory to standard guideline based therapy  6. Patients with statin intolerance (failed 2 statins, one of which must be a high potency statin)  7. Patients who the provider desires to be seen by MD   Internal PharmD referral criteria:   1. Follow-up patients for medication management  2. Follow-up for compliance monitoring  3. Patients for drug education  4. Patients with statin intolerance  5. PCSK9 inhibitor education and prior authorization approvals  6. Patients with triglycerides <500 mg/dL  External Lipid Clinic Referral  External lipid clinic referrals are for providers outside of [place], considered new clinic patients - automatically routed to MD schedule   Call MD for:  difficulty breathing, headache or visual disturbances   Complete by: As directed    Call MD for:  extreme fatigue   Complete by: As directed    Call MD for:  hives   Complete by: As directed    Call MD for:  persistant dizziness or light-headedness   Complete by: As directed    Call MD for:  persistant nausea and vomiting   Complete by: As  directed    Call MD for:  severe uncontrolled pain   Complete by: As directed    Call MD for:  temperature >100.4   Complete by: As directed    Diet - low sodium heart healthy   Complete by: As directed    Diet Carb Modified   Complete by: As directed    Discharge instructions   Complete by: As directed    Discharge instructions for diabetes mellitus: Check blood sugar 3 times a day and bedtime at home. If blood sugar running above 200 or less than 70 please call your MD to adjust insulin. If you notice signs and symptoms of hypoglycemia (low blood sugar) like jitteriness, confusion, thirst, tremor and sweating, please check blood sugar, drink sugary drink/biscuits/sweets to increase sugar level and call MD or return to ER.    General discharge instructions: Follow with Primary MD Copland, Gay Filler, MD in 7 days  Please request your PCP  to go over your hospital tests, procedures, radiology results at the follow up. Please get your medicines reviewed and adjusted.  Your PCP may decide to repeat certain labs or tests as needed. Do not drive, operate heavy machinery, perform activities at heights, swimming or participation in water activities or provide baby sitting services if your were admitted for syncope or siezures until you have seen by Primary MD or a Neurologist and advised to do so again. [place] Controlled Substance Reporting System database was reviewed. Do not drive, operate heavy machinery, perform activities at heights, swim, participate in water activities or provide baby-sitting services while on medications for pain, sleep and mood until your outpatient physician has reevaluated you and advised to do so again.  You are strongly recommended to comply with the dose, frequency and duration of prescribed medications. Activity: As tolerated with Full fall precautions use walker/cane & assistance as needed Avoid using any recreational substances like cigarette, tobacco,  alcohol, or non-prescribed drug. If you experience worsening of your admission symptoms, develop shortness of breath, life threatening emergency, suicidal or homicidal thoughts you must seek medical attention immediately by calling 911 or calling your MD immediately  if symptoms less severe. You must read complete instructions/literature along with all the possible adverse reactions/side effects for all the medicines you take and that have been prescribed to you. Take any new medicine only after you have completely understood and accepted all the possible adverse reactions/side effects.  Wear Seat belts while driving. You were cared for by a hospitalist during your hospital stay. If you have any questions about your discharge medications or the care you received while you were in the hospital after you are discharged, you can call the unit and ask to speak with the hospitalist or the covering physician. Once you are discharged, your primary care physician will handle any further medical issues. Please note that NO REFILLS for  any discharge medications will be authorized once you are discharged, as it is imperative that you return to your primary care physician (or establish a relationship with a primary care physician if you do not have one).   Increase activity slowly   Complete by: As directed        Discharge Medications:   Allergies as of 07/29/2021       Reactions   Ace Inhibitors Diarrhea, Swelling, Other (See Comments), Cough   Pt had cough and diarrhea with first few doses of medication; also had swelling of right eyelid. Stopped medication on 03/17/11.   Alendronate Sodium Other (See Comments)   "Made my whole body hurt"   Aspirin Hives   Crestor [rosuvastatin] Other (See Comments)   "Made my whole body hurt"   Lipitor [atorvastatin Calcium] Other (See Comments)   "Made my whole body hurt"   Metformin And Related    Severe abdominal pain   Shellfish Allergy Nausea Only   Intolerant  of fresh shellfish, reports the reaction is GI upset, denies hives, denies any swelling  Reports that she can tolerate canned seafood.         Medication List     STOP taking these medications    cloNIDine 0.1 MG tablet Commonly known as: CATAPRES   hydrochlorothiazide 25 MG tablet Commonly known as: HYDRODIURIL   Lantus SoloStar 100 UNIT/ML Solostar Pen Generic drug: insulin glargine Replaced by: insulin glargine-yfgn 100 UNIT/ML injection       TAKE these medications    amLODipine 5 MG tablet Commonly known as: NORVASC TAKE 1 TABLET(5 MG) BY MOUTH DAILY What changed:  how much to take how to take this when to take this additional instructions   blood glucose meter kit and supplies Dispense based on patient and insurance preference. Pt just needs meter   clopidogrel 75 MG tablet Commonly known as: PLAVIX Take 1 tablet (75 mg total) by mouth daily.   glipiZIDE 5 MG 24 hr tablet Commonly known as: GLUCOTROL XL Take 2 tabs in the morning and 1 tab in the evening. What changed:  how much to take how to take this when to take this   glucose blood test strip Commonly known as: ONE TOUCH ULTRA TEST Test blood sugar 3 times a day. Dx code: 250.00   OneTouch Ultra test strip Generic drug: glucose blood Use as instructed   insulin aspart 100 UNIT/ML injection Commonly known as: novoLOG Inject 3 Units into the skin 3 (three) times daily with meals.   insulin aspart 100 UNIT/ML injection Commonly known as: novoLOG Inject 0-5 Units into the skin at bedtime.   insulin aspart 100 UNIT/ML injection Commonly known as: novoLOG Inject 0-20 Units into the skin 3 (three) times daily with meals.   insulin glargine-yfgn 100 UNIT/ML injection Commonly known as: SEMGLEE Inject 0.3 mLs (30 Units total) into the skin daily. Start taking on: July 30, 2021 Replaces: Lantus SoloStar 100 UNIT/ML Solostar Pen   Insulin Pen Needle 32G X 4 MM Misc Commonly known as: BD Pen  Needle Nano U/F USE TO INJECT INSULIN ONCE DAILY   INSULIN SYRINGE .5CC/31GX5/16" 31G X 5/16" 0.5 ML Misc Use to inject insulin 1 time daily.   Lancets Misc 1 each by Does not apply route 3 (three) times daily. Dx: E11.65   ONE TOUCH ULTRA MINI w/Device Kit Use to test blood sugar daily as instructed. Dx: E11.65   senna-docusate 8.6-50 MG tablet Commonly known as: Senokot-S Take 1 tablet by  mouth at bedtime as needed for moderate constipation.         The results of significant diagnostics from this hospitalization (including imaging, microbiology, ancillary and laboratory) are listed below for reference.    Procedures and Diagnostic Studies:   ECHOCARDIOGRAM COMPLETE BUBBLE STUDY  Result Date: 07/25/2021    ECHOCARDIOGRAM REPORT   Patient Name:   Aliviah MARIE Lacock Date of Exam: 07/25/2021 Medical Rec #:  [id number]           Height:       61.0 in Accession #:    [id number]          Weight:       172.0 lb Date of Birth:  1940/06/24            BSA:          1.771 m Patient Age:    64 years            BP:           150/72 mmHg Patient Gender: F                   HR:           95 bpm. Exam Location:  Inpatient Procedure: 2D Echo, Cardiac Doppler and Color Doppler Indications:    Stroke  History:        Patient has prior history of Echocardiogram examinations, most                 recent 09/20/2018. Risk Factors:Hypertension and Diabetes.  Sonographer:    Jefferey Pica Referring Phys: [place]  1. Peak intra-cavitary gradient 60 mmHg at rest and 68 mmHg with Valsalva. Left ventricular ejection fraction, by estimation, is >75%. The left ventricle has hyperdynamic function. The left ventricle has no regional wall motion abnormalities. There is mild left ventricular hypertrophy. Left ventricular diastolic parameters were normal.  2. Right ventricular systolic function is normal. The right ventricular size is normal. There is normal pulmonary artery systolic pressure.  3.  The mitral valve was not well visualized. Trivial mitral valve regurgitation. No evidence of mitral stenosis. Moderate mitral annular calcification.  4. The aortic valve was not well visualized. Aortic valve regurgitation is not visualized. No aortic stenosis is present.  5. The inferior vena cava is normal in size with greater than 50% respiratory variability, suggesting right atrial pressure of 3 mmHg. Conclusion(s)/Recommendation(s): No intracardiac source of embolism detected on this transthoracic study. Consider a transesophageal echocardiogram to exclude cardiac source of embolism if clinically indicated. FINDINGS  Left Ventricle: Peak intra-cavitary gradient 60 mmHg at rest and 68 mmHg with Valsalva. Left ventricular ejection fraction, by estimation, is >75%. The left ventricle has hyperdynamic function. The left ventricle has no regional wall motion abnormalities. The left ventricular internal cavity size was normal in size. There is mild left ventricular hypertrophy. Left ventricular diastolic parameters were normal. Right Ventricle: The right ventricular size is normal. Right vetricular wall thickness was not well visualized. Right ventricular systolic function is normal. There is normal pulmonary artery systolic pressure. The tricuspid regurgitant velocity is 1.22 m/s, and with an assumed right atrial pressure of 3 mmHg, the estimated right ventricular systolic pressure is 9.0 mmHg. Left Atrium: Left atrial size was normal in size. Right Atrium: Right atrial size was normal in size. Pericardium: Trivial pericardial effusion is present. Presence of epicardial fat layer. Mitral Valve: The mitral valve was not well visualized. Moderate mitral annular calcification. Trivial  mitral valve regurgitation. No evidence of mitral valve stenosis. Tricuspid Valve: The tricuspid valve is grossly normal. Tricuspid valve regurgitation is not demonstrated. No evidence of tricuspid stenosis. Aortic Valve: The aortic valve  was not well visualized. Aortic valve regurgitation is not visualized. No aortic stenosis is present. Aortic valve peak gradient measures 7.4 mmHg. Pulmonic Valve: The pulmonic valve was not well visualized. Pulmonic valve regurgitation is not visualized. Aorta: The aortic root, ascending aorta, aortic arch and descending aorta are all structurally normal, with no evidence of dilitation or obstruction. Venous: The inferior vena cava is normal in size with greater than 50% respiratory variability, suggesting right atrial pressure of 3 mmHg. IAS/Shunts: The interatrial septum was not well visualized.  LEFT VENTRICLE PLAX 2D LVIDd:         4.00 cm   Diastology LVIDs:         1.80 cm   LV e' medial:    4.80 cm/s LV PW:         1.10 cm   LV E/e' medial:  12.8 LV IVS:        1.00 cm   LV e' lateral:   6.68 cm/s LVOT diam:     2.00 cm   LV E/e' lateral: 9.2 LV SV:         70 LV SV Index:   40 LVOT Area:     3.14 cm  IVC IVC diam: 1.40 cm LEFT ATRIUM             Index        RIGHT ATRIUM           Index LA diam:        3.70 cm 2.09 cm/m   RA Area:     13.90 cm LA Vol (A2C):   28.4 ml 16.03 ml/m  RA Volume:   34.10 ml  19.25 ml/m LA Vol (A4C):   26.5 ml 14.96 ml/m LA Biplane Vol: 28.1 ml 15.86 ml/m  AORTIC VALVE                 PULMONIC VALVE AV Area (Vmax): 3.58 cm     PV Vmax:       1.19 m/s AV Vmax:        136.00 cm/s  PV Peak grad:  5.7 mmHg AV Peak Grad:   7.4 mmHg LVOT Vmax:      155.00 cm/s LVOT Vmean:     79.800 cm/s LVOT VTI:       0.224 m  AORTA Ao Root diam: 3.30 cm Ao Asc diam:  2.50 cm MITRAL VALVE                TRICUSPID VALVE MV Area (PHT): 5.97 cm     TR Peak grad:   6.0 mmHg MV Decel Time: 127 msec     TR Vmax:        122.00 cm/s MV E velocity: 61.20 cm/s MV A velocity: 124.00 cm/s  SHUNTS MV E/A ratio:  0.49         Systemic VTI:  0.22 m                             Systemic Diam: 2.00 cm Buford Dresser MD Electronically signed by Buford Dresser MD Signature Date/Time:  07/25/2021/5:43:26 PM    Final    MR BRAIN WO CONTRAST  Result Date: 07/25/2021 CLINICAL DATA:  Stroke follow-up. EXAM: MRI HEAD WITHOUT  CONTRAST TECHNIQUE: Multiplanar, multiecho pulse sequences of the brain and surrounding structures were obtained without intravenous contrast. COMPARISON:  CT/CTA head and neck dated 1 day prior FINDINGS: Image quality is degraded by motion artifact particularly involving the axial SWI and axial T2 sequences. Brain: There is moderate-sized region of diffusion restriction and associated FLAIR signal abnormality centered in the right parietal lobe in the MCA distribution consistent with evolving acute to subacute infarct. There are numerous additional patchy foci of diffusion restriction and FLAIR signal abnormality throughout both cerebral hemispheres involving the bilateral frontal lobes and right temporal lobe consistent with additional smaller acute to subacute infarcts. The SWI sequence is significantly motion degraded, but there is no definite hemorrhagic transformation. There is no definite acute intracranial hemorrhage or extra-axial fluid collection. Background parenchymal volume is normal for age. The ventricles are stable in size. There are a few small remote lacunar infarcts in the bilateral corona radiata/centrum semiovale and background advanced chronic white matter microangiopathy. There is no solid mass lesion.  There is no midline shift. Vascular: Normal flow voids. Skull and upper cervical spine: Normal marrow signal. Sinuses/Orbits: The paranasal sinuses are clear. Bilateral lens implants are in place. The globes and orbits are otherwise unremarkable. Other: None. IMPRESSION: 1. Image quality is significantly degraded by motion artifact. 2. Multiple evolving acute to subacute infarcts in both cerebral hemispheres (right more than left) as above with the largest infarct in the right parietal lobe in the MCA distribution corresponding to the finding on the prior  noncontrast CT head. No definite hemorrhagic transformation. Electronically Signed   By: Valetta Mole M.D.   On: 07/25/2021 17:00   DG CHEST PORT 1 VIEW  Result Date: 07/25/2021 CLINICAL DATA:  An 81 year old female presents for evaluation of fever, history of stroke. EXAM: PORTABLE CHEST 1 VIEW COMPARISON:  September 19, 2018 FINDINGS: EKG leads project over the chest. Cardiomediastinal contours and hilar structures are stable. Lungs are clear. No sign of effusion or pneumothorax on frontal radiograph. On limited assessment no acute skeletal process. IMPRESSION: No acute cardiopulmonary disease. Electronically Signed   By: Zetta Bills M.D.   On: 07/25/2021 09:28   CT ANGIO HEAD NECK W WO CM W PERF (CODE STROKE)  Result Date: 07/24/2021 CLINICAL DATA:  81 year old female code stroke. EXAM: CT ANGIOGRAPHY HEAD AND NECK CT PERFUSION BRAIN TECHNIQUE: Multidetector CT imaging of the head and neck was performed using the standard protocol during bolus administration of intravenous contrast. Multiplanar CT image reconstructions and MIPs were obtained to evaluate the vascular anatomy. Carotid stenosis measurements (when applicable) are obtained utilizing NASCET criteria, using the distal internal carotid diameter as the denominator. Multiphase CT imaging of the brain was performed following IV bolus contrast injection. Subsequent parametric perfusion maps were calculated using RAPID software. RADIATION DOSE REDUCTION: This exam was performed according to the departmental dose-optimization program which includes automated exposure control, adjustment of the mA and/or kV according to patient size and/or use of iterative reconstruction technique. CONTRAST:  190m OMNIPAQUE IOHEXOL 350 MG/ML SOLN COMPARISON:  Brain MRI 09/19/2018.  Head CT 09/20/2018. FINDINGS: CT HEAD FINDINGS Brain: Cerebral volume not significantly changed since 2020. No midline shift, mass effect, or evidence of intracranial mass lesion. No  ventriculomegaly. Patchy chronic bilateral cerebral white matter hypodensity. Cytotoxic edema right parietal lobe series 4, image 26. Subtle associated increased hypodensity in the right centrum semiovale white matter. ASPECTS 8. Other right MCA territory gray-white matter differentiation appears stable, maintained. No acute intracranial hemorrhage identified. Vascular: Calcified atherosclerosis  at the skull base. No suspicious intracranial vascular hyperdensity. Skull: No acute osseous abnormality identified. Sinuses/Orbits: Visualized paranasal sinuses and mastoids are stable and well aerated. Other: Rightward gaze. Visualized scalp soft tissues are within normal limits. Preliminary results were communicated to Dr. Rory Percy at 11:38 am on 07/24/2021 by text page via the [facility] messaging system. CT Brain Perfusion Findings: ASPECTS: 8 CBF (<30%) Volume: 0 mL. No CBF or CBV parameter abnormalities detected. Perfusion (Tmax>6.0s) volume: 11 mL, some corresponding to the abnormal ASPECTS and some also in the right temporal lobe Mismatch Volume: Less than 11 mL Infarction Location:Right MCA territory is implicated CTA NECK Skeleton: No acute osseous abnormality identified. Age-appropriate cervical spine degeneration. Upper chest: Negative upper lungs and visible mediastinum. Other neck: Heterogeneously enlarged thyroid In the setting of significant comorbidities or limited life expectancy, no follow-up recommended (ref: J Am Coll Radiol. 2015 Feb;12(2): 143-50).Motion artifact. Aortic arch: Calcified aortic atherosclerosis. 3 vessel arch configuration. Right carotid system: Brachiocephalic artery and proximal right CCA plaque with no significant stenosis. Artifact obscures detail of the right carotid bifurcation where at least moderate atherosclerosis is present. At the level of the right ICA bulb high-grade stenosis is suspected approaching a radiographic string sign series 16, image 193. But the right ICA remains patent  to the skull base. Left carotid system: Left CCA origin plaque and additional soft and calcified plaque before the bifurcation without stenosis. Motion artifact but better visualization of the proximal left ICA. No hemodynamically significant stenosis. Evidence of previous left carotid endarterectomy. Vertebral arteries: Tortuosity and atherosclerosis at the right subclavian artery origin with 60 % stenosis with respect to the distal vessel. Calcified plaque at the right vertebral artery origin with severe stenosis (series 17, image 89). Non dominant right vertebral artery with abundant additional atherosclerosis to the skull base, but the vessel remains patent despite tandem stenoses. Proximal left subclavian atherosclerosis with less than 50 % stenosis with respect to the distal vessel. Calcified plaque at the left vertebral artery origin with up to moderate V1 segment stenosis. Dominant left vertebral artery with abundant additional calcified plaque in the neck, at least moderate P2 stenosis series 16, image 206, and moderate to severe V2 stenosis image 194. But the vessel remains patent to the skull base. CTA HEAD Posterior circulation: Diminutive and stenotic right vertebral V4 segment remains patent to the PICA origin, and possibly the vertebrobasilar junction. Dominant left vertebral V4 segment with mild to moderate stenosis at the cisterna magna, but remains patent and supplies the basilar. Left PICA origin is patent. Patent basilar artery without stenosis. Patent basilar tip, SCA and PCA origins. Mild bilateral PCA origin stenosis. Mild to moderate stenosis left PCA P2 segment. Other bilateral PCA branches are within normal limits. Anterior circulation: Both ICA siphons are patent. Left siphon calcified plaque with mild to moderate supraclinoid stenosis. Right siphon calcified plaque with only mild stenosis. Posterior communicating arteries are diminutive or absent. Patent carotid termini. Patent MCA and  ACA origins. Dominant right and diminutive or absent left ACA A1 segments. Normal anterior communicating artery. Bilateral ACA branches are within normal limits. Left MCA M1 segment and bifurcation are patent without stenosis. Left MCA branches are within normal limits. Right MCA M1 segment and bifurcation are patent without stenosis. No right MCA branch occlusion or high-grade stenosis is identified. Venous sinuses: Early contrast timing, not evaluated. Anatomic variants: Dominant left and diminutive right vertebral arteries, dominant right and diminutive or absent left A1 segments. Review of the MIP images confirms the above  findings IMPRESSION: 1. Cytotoxic edema in the posterior Right MCA territory (ASPECTS 8) with no hemorrhage or mass effect. CTP does not detect infarct core, suggesting it might be subacute. And only small volume asymmetric T-max abnormality in the right hemisphere. 2. No right MCA branch occlusion identified. Motion artifact in the neck but suspicion of high-grade right ICA origin stenosis, probably RADIOGRAPHIC STRING SIGN. Right siphon calcified plaque but only mild stenosis. 3. Evidence of previous Left Carotid Endarterectomy. Mild to moderate left siphon stenosis. 4. Atherosclerotic and highly stenotic bilateral vertebral arteries, the left is dominant. Mild bilateral PCA origin and moderate Left PCA P2 stenoses. 5. Aortic Atherosclerosis (ICD10-I70.0). Salient findings discussed by telephone with Dr. Rory Percy on 07/24/2021 at 12:06 . Electronically Signed   By: Genevie Ann M.D.   On: 07/24/2021 12:08     Labs:   Basic Metabolic Panel: Recent Labs  Lab 07/24/21 1122 07/24/21 1126 07/26/21 0834 07/27/21 0424 07/28/21 0346 07/28/21 0711 07/29/21 0547  NA 137 137 138 143 138  --  140  K 3.5 3.4* 2.9* 3.9 3.9  --  3.7  CL 101 102 106 113* 107  --  107  CO2 18*  --  19* 22 19*  --  23  GLUCOSE 369* 384* 248* 207* 374* 360* 211*  BUN 23 25* _0 --  24*  CREATININE 1.23* 0.90  0.77 0.82 0.83  --  0.87  CALCIUM 10.0  --  9.7 9.1 9.5  --  9.8  MG  --   --  1.6* 2.0  --   --   --   PHOS  --   --   --  3.2  --   --   --    GFR Estimated Creatinine Clearance: 48 mL/min (by C-G formula based on SCr of 0.87 mg/dL). Liver Function Tests: Recent Labs  Lab 07/24/21 1122 07/27/21 0424  AST 26 34  ALT 22 41  ALKPHOS 45 42  BILITOT 1.1 0.9  PROT 7.8 6.4*  ALBUMIN 4.3 3.2*   No results for input(s): "LIPASE", "AMYLASE" in the last 168 hours. No results for input(s): "AMMONIA" in the last 168 hours. Coagulation profile Recent Labs  Lab 07/24/21 1122  INR 1.0    CBC: Recent Labs  Lab 07/24/21 1122 07/24/21 1126 07/26/21 0834 07/27/21 0424  WBC 12.5*  --  8.0 7.8  NEUTROABS 9.9*  --  5.4 5.0  HGB 14.4 13.9 13.9 13.0  HCT 39.7 41.0 39.3 36.8  MCV 84.8  --  86.4 88.0  PLT 269  --  260 240   Cardiac Enzymes: No results for input(s): "CKTOTAL", "CKMB", "CKMBINDEX", "TROPONINI" in the last 168 hours. BNP: Invalid input(s): "POCBNP" CBG: Recent Labs  Lab 07/28/21 1203 07/28/21 1647 07/28/21 2118 07/29/21 0757 07/29/21 1212  GLUCAP 356* 367* 249* 231* 181*   D-Dimer No results for input(s): "DDIMER" in the last 72 hours. Hgb A1c No results for input(s): "HGBA1C" in the last 72 hours. Lipid Profile No results for input(s): "CHOL", "HDL", "LDLCALC", "TRIG", "CHOLHDL", "LDLDIRECT" in the last 72 hours. Thyroid function studies No results for input(s): "TSH", "T4TOTAL", "T3FREE", "THYROIDAB" in the last 72 hours.  Invalid input(s): "FREET3" Anemia work up No results for input(s): "VITAMINB12", "FOLATE", "FERRITIN", "TIBC", "IRON", "RETICCTPCT" in the last 72 hours. Microbiology Recent Results (from the past 240 hour(s))  SARS Coronavirus 2 by RT PCR (hospital order, performed in [facility] hospital lab) *cepheid single result test* Anterior Nasal Swab  Status: None   Collection Time: 07/25/21 10:15 AM   Specimen: Anterior Nasal Swab  Result  Value Ref Range Status   SARS Coronavirus 2 by RT PCR NEGATIVE NEGATIVE Final    Comment: (NOTE) SARS-CoV-2 target nucleic acids are NOT DETECTED.  The SARS-CoV-2 RNA is generally detectable in upper and lower respiratory specimens during the acute phase of infection. The lowest concentration of SARS-CoV-2 viral copies this assay can detect is 250 copies / mL. A negative result does not preclude SARS-CoV-2 infection and should not be used as the sole basis for treatment or other patient management decisions.  A negative result may occur with improper specimen collection / handling, submission of specimen other than nasopharyngeal swab, presence of viral mutation(s) within the areas targeted by this assay, and inadequate number of viral copies (<250 copies / mL). A negative result must be combined with clinical observations, patient history, and epidemiological information.  Fact Sheet for Patients:   https://www.patel.info/  Fact Sheet for Healthcare Providers: https://hall.com/  This test is not yet approved or  cleared by the [place] FDA and has been authorized for detection and/or diagnosis of SARS-CoV-2 by FDA under an Emergency Use Authorization (EUA).  This EUA will remain in effect (meaning this test can be used) for the duration of the COVID-19 declaration under Section 564(b)(1) of the Act, 21 U.S.C. section 360bbb-3(b)(1), unless the authorization is terminated or revoked sooner.  Performed at [facility], [place] [address] [place], [place] [address]     Time coordinating discharge: 35 minutes  Signed: Hisham Provence  [facility] 07/29/2021, 2:30 PM

## 2021-07-29 NOTE — Progress Notes (Signed)
Interventional Radiology Brief Note:  PA met with patient and daughter again today to discuss the merits of cerebral angiogram.    Wanda [name] is resting comfortably in bed.  She is alert, oriented, and coherent but has minimal participation in discussion today.  Closes her eyes at times. Daughter at bedside asks appropriate questions and relays all information to her mother to ensure she understands.  Addressed patient's concerns, specially related to any discomfort during the procedure.  Again, she is ambiguous and generally seems as though she does not want to pursue angiogram.  Daughter suggests that the patient continue with current efforts including rehab and that angiogram be considered at a later date.   I have notified our team that no angiogram is to occur today.  Will place request for schedulers to contact her as an outpatient.   Order for angiogram canceled.   Brynda Greathouse, MS RD PA-C

## 2021-07-29 NOTE — Progress Notes (Signed)
Physical Therapy Treatment Patient Details Name: Wanda [name] MRN: [id number] DOB: 1940-02-01 Today's Date: 07/29/2021   History of Present Illness Pt is an 81 y.o. female presenting to [facility] ED on 07/24/2021 with left-sided weakness and right gaze deviation. CT + subacute infarct in R temporal lobe. MRI revealed multiple evolving acute to subacute infarcts in both cerebral  hemispheres (right more than left) with the largest infarct in the right parietal lobe in the MCA distribution consistent with prior CT. PMH includes: arthritis, carotid stenosis, DM2, HTN, urinary leakage, s/p L carotid endarterectomy, B TKA.    PT Comments    Patient cooperative with PT session. She needs cues for increased left side attention and looking to the left. She continues to require assistance with be mobility for rolling with increased assistance required with rolling to the right. Patient declined sitting up on edge of bed due to fatigue. She participated in LE exercises for strengthening. Continue to recommend inpatient rehab at discharge. PT will continue to follow while in the hospital to maximize independence and decrease caregiver burden.    Recommendations for follow up therapy are one component of a multi-disciplinary discharge planning process, led by the attending physician.  Recommendations may be updated based on patient status, additional functional criteria and insurance authorization.  Follow Up Recommendations  Acute inpatient rehab (3hours/day)     Assistance Recommended at Discharge Frequent or constant Supervision/Assistance  Patient can return home with the following Two people to help with walking and/or transfers;Two people to help with bathing/dressing/bathroom;Assistance with cooking/housework;Assist for transportation;Direct supervision/assist for medications management   Equipment Recommendations   (to be determined at next level of care)    Recommendations for Other Services        Precautions / Restrictions Precautions Precautions: Fall Precaution Comments: left inattention Restrictions Weight Bearing Restrictions: No     Mobility  Bed Mobility Overal bed mobility: Needs Assistance Bed Mobility: Rolling Rolling: Mod assist         General bed mobility comments: moderate assistance for rolling to right side and minimal assistance for rolling to left side. hand over hand cues and verbal cues for reaching across midline with LUE to facilitate rolling to right side. increased time and effort required with all mobility efforts. patient declined sitting up on edge of bed due to fatigue    Transfers                        Ambulation/Gait                   Stairs             Wheelchair Mobility    Modified Rankin (Stroke Patients Only)       Balance                                            Cognition Arousal/Alertness: Awake/alert Behavior During Therapy: Flat affect Overall Cognitive Status: Impaired/Different from baseline                         Following Commands: Follows one step commands with increased time                Exercises General Exercises - Lower Extremity Heel Slides: AAROM, Strengthening, Left, 10 reps, Supine Straight Leg Raises: AAROM, Strengthening, Left,  10 reps, Supine Other Exercises Other Exercises: verbal cues for exercise technique for strengthening    General Comments        Pertinent Vitals/Pain Pain Assessment Pain Assessment: No/denies pain    Home Living                          Prior Function            PT Goals (current goals can now be found in the care plan section) Acute Rehab PT Goals Patient Stated Goal: to get to rehab PT Goal Formulation: With patient Time For Goal Achievement: 08/01/21 Potential to Achieve Goals: Good Progress towards PT goals: Progressing toward goals    Frequency    Min  4X/week      PT Plan Current plan remains appropriate    Co-evaluation              AM-PAC PT "6 Clicks" Mobility   Outcome Measure  Help needed turning from your back to your side while in a flat bed without using bedrails?: A Lot Help needed moving from lying on your back to sitting on the side of a flat bed without using bedrails?: A Lot Help needed moving to and from a bed to a chair (including a wheelchair)?: Total Help needed standing up from a chair using your arms (e.g., wheelchair or bedside chair)?: A Lot Help needed to walk in hospital room?: Total Help needed climbing 3-5 steps with a railing? : Total 6 Click Score: 9    End of Session   Activity Tolerance: Patient tolerated treatment well Patient left: in bed;with call bell/phone within reach;with bed alarm set (set-up with lunch tray)   PT Visit Diagnosis: Unsteadiness on feet (R26.81);Difficulty in walking, not elsewhere classified (R26.2);Other symptoms and signs involving the nervous system (R29.898)     Time: 8264-1583 PT Time Calculation (min) (ACUTE ONLY): 14 min  Charges:  $Therapeutic Activity: 8-22 mins                    Minna Merritts, PT, MPT    Percell Locus 07/29/2021, 2:51 PM

## 2021-07-30 DIAGNOSIS — I10 Essential (primary) hypertension: Secondary | ICD-10-CM | POA: Diagnosis not present

## 2021-07-30 DIAGNOSIS — I639 Cerebral infarction, unspecified: Secondary | ICD-10-CM | POA: Diagnosis not present

## 2021-07-30 DIAGNOSIS — E785 Hyperlipidemia, unspecified: Secondary | ICD-10-CM | POA: Diagnosis not present

## 2021-07-30 DIAGNOSIS — E1169 Type 2 diabetes mellitus with other specified complication: Secondary | ICD-10-CM | POA: Diagnosis not present

## 2021-07-30 DIAGNOSIS — Z794 Long term (current) use of insulin: Secondary | ICD-10-CM | POA: Diagnosis not present

## 2021-08-01 DIAGNOSIS — I10 Essential (primary) hypertension: Secondary | ICD-10-CM | POA: Diagnosis not present

## 2021-08-01 DIAGNOSIS — R2689 Other abnormalities of gait and mobility: Secondary | ICD-10-CM | POA: Diagnosis not present

## 2021-08-01 DIAGNOSIS — I639 Cerebral infarction, unspecified: Secondary | ICD-10-CM | POA: Diagnosis not present

## 2021-08-01 DIAGNOSIS — E785 Hyperlipidemia, unspecified: Secondary | ICD-10-CM | POA: Diagnosis not present

## 2021-08-01 DIAGNOSIS — I63311 Cerebral infarction due to thrombosis of right middle cerebral artery: Secondary | ICD-10-CM | POA: Diagnosis not present

## 2021-08-01 DIAGNOSIS — Z794 Long term (current) use of insulin: Secondary | ICD-10-CM | POA: Diagnosis not present

## 2021-08-01 DIAGNOSIS — E1169 Type 2 diabetes mellitus with other specified complication: Secondary | ICD-10-CM | POA: Diagnosis not present

## 2021-08-02 DIAGNOSIS — I63311 Cerebral infarction due to thrombosis of right middle cerebral artery: Secondary | ICD-10-CM | POA: Diagnosis not present

## 2021-08-02 DIAGNOSIS — R2689 Other abnormalities of gait and mobility: Secondary | ICD-10-CM | POA: Diagnosis not present

## 2021-08-02 DIAGNOSIS — I10 Essential (primary) hypertension: Secondary | ICD-10-CM | POA: Diagnosis not present

## 2021-08-03 DIAGNOSIS — I10 Essential (primary) hypertension: Secondary | ICD-10-CM | POA: Diagnosis not present

## 2021-08-03 DIAGNOSIS — I63311 Cerebral infarction due to thrombosis of right middle cerebral artery: Secondary | ICD-10-CM | POA: Diagnosis not present

## 2021-08-03 DIAGNOSIS — R2689 Other abnormalities of gait and mobility: Secondary | ICD-10-CM | POA: Diagnosis not present

## 2021-08-04 DIAGNOSIS — I63311 Cerebral infarction due to thrombosis of right middle cerebral artery: Secondary | ICD-10-CM | POA: Diagnosis not present

## 2021-08-04 DIAGNOSIS — E1169 Type 2 diabetes mellitus with other specified complication: Secondary | ICD-10-CM | POA: Diagnosis not present

## 2021-08-04 DIAGNOSIS — I10 Essential (primary) hypertension: Secondary | ICD-10-CM | POA: Diagnosis not present

## 2021-08-04 DIAGNOSIS — Z794 Long term (current) use of insulin: Secondary | ICD-10-CM | POA: Diagnosis not present

## 2021-08-04 DIAGNOSIS — E785 Hyperlipidemia, unspecified: Secondary | ICD-10-CM | POA: Diagnosis not present

## 2021-08-04 DIAGNOSIS — I639 Cerebral infarction, unspecified: Secondary | ICD-10-CM | POA: Diagnosis not present

## 2021-08-04 DIAGNOSIS — R2689 Other abnormalities of gait and mobility: Secondary | ICD-10-CM | POA: Diagnosis not present

## 2021-08-05 DIAGNOSIS — I10 Essential (primary) hypertension: Secondary | ICD-10-CM | POA: Diagnosis not present

## 2021-08-05 DIAGNOSIS — I63311 Cerebral infarction due to thrombosis of right middle cerebral artery: Secondary | ICD-10-CM | POA: Diagnosis not present

## 2021-08-05 DIAGNOSIS — R2689 Other abnormalities of gait and mobility: Secondary | ICD-10-CM | POA: Diagnosis not present

## 2021-08-06 DIAGNOSIS — I10 Essential (primary) hypertension: Secondary | ICD-10-CM | POA: Diagnosis not present

## 2021-08-06 DIAGNOSIS — R2689 Other abnormalities of gait and mobility: Secondary | ICD-10-CM | POA: Diagnosis not present

## 2021-08-06 DIAGNOSIS — I63311 Cerebral infarction due to thrombosis of right middle cerebral artery: Secondary | ICD-10-CM | POA: Diagnosis not present

## 2021-08-07 DIAGNOSIS — I10 Essential (primary) hypertension: Secondary | ICD-10-CM | POA: Diagnosis not present

## 2021-08-07 DIAGNOSIS — R2689 Other abnormalities of gait and mobility: Secondary | ICD-10-CM | POA: Diagnosis not present

## 2021-08-07 DIAGNOSIS — I63311 Cerebral infarction due to thrombosis of right middle cerebral artery: Secondary | ICD-10-CM | POA: Diagnosis not present

## 2021-08-08 DIAGNOSIS — I63311 Cerebral infarction due to thrombosis of right middle cerebral artery: Secondary | ICD-10-CM | POA: Diagnosis not present

## 2021-08-08 DIAGNOSIS — R2689 Other abnormalities of gait and mobility: Secondary | ICD-10-CM | POA: Diagnosis not present

## 2021-08-08 DIAGNOSIS — I10 Essential (primary) hypertension: Secondary | ICD-10-CM | POA: Diagnosis not present

## 2021-08-09 DIAGNOSIS — R2689 Other abnormalities of gait and mobility: Secondary | ICD-10-CM | POA: Diagnosis not present

## 2021-08-09 DIAGNOSIS — I10 Essential (primary) hypertension: Secondary | ICD-10-CM | POA: Diagnosis not present

## 2021-08-09 DIAGNOSIS — I63311 Cerebral infarction due to thrombosis of right middle cerebral artery: Secondary | ICD-10-CM | POA: Diagnosis not present

## 2021-08-10 DIAGNOSIS — I63311 Cerebral infarction due to thrombosis of right middle cerebral artery: Secondary | ICD-10-CM | POA: Diagnosis not present

## 2021-08-10 DIAGNOSIS — I10 Essential (primary) hypertension: Secondary | ICD-10-CM | POA: Diagnosis not present

## 2021-08-10 DIAGNOSIS — R2689 Other abnormalities of gait and mobility: Secondary | ICD-10-CM | POA: Diagnosis not present

## 2021-08-11 ENCOUNTER — Encounter: Payer: Self-pay | Admitting: Internal Medicine

## 2021-08-11 DIAGNOSIS — I10 Essential (primary) hypertension: Secondary | ICD-10-CM | POA: Diagnosis not present

## 2021-08-11 DIAGNOSIS — I63311 Cerebral infarction due to thrombosis of right middle cerebral artery: Secondary | ICD-10-CM | POA: Diagnosis not present

## 2021-08-11 DIAGNOSIS — R2689 Other abnormalities of gait and mobility: Secondary | ICD-10-CM | POA: Diagnosis not present

## 2021-08-12 DIAGNOSIS — I10 Essential (primary) hypertension: Secondary | ICD-10-CM | POA: Diagnosis not present

## 2021-08-12 DIAGNOSIS — E1165 Type 2 diabetes mellitus with hyperglycemia: Secondary | ICD-10-CM | POA: Diagnosis not present

## 2021-08-12 DIAGNOSIS — I63511 Cerebral infarction due to unspecified occlusion or stenosis of right middle cerebral artery: Secondary | ICD-10-CM | POA: Diagnosis not present

## 2021-08-12 DIAGNOSIS — E785 Hyperlipidemia, unspecified: Secondary | ICD-10-CM | POA: Diagnosis not present

## 2021-08-18 ENCOUNTER — Inpatient Hospital Stay: Payer: Medicare Other | Admitting: Family Medicine

## 2021-08-27 DIAGNOSIS — Z7901 Long term (current) use of anticoagulants: Secondary | ICD-10-CM | POA: Diagnosis not present

## 2021-08-27 DIAGNOSIS — Z7902 Long term (current) use of antithrombotics/antiplatelets: Secondary | ICD-10-CM | POA: Diagnosis not present

## 2021-08-27 DIAGNOSIS — M1990 Unspecified osteoarthritis, unspecified site: Secondary | ICD-10-CM | POA: Diagnosis not present

## 2021-08-27 DIAGNOSIS — Z7984 Long term (current) use of oral hypoglycemic drugs: Secondary | ICD-10-CM | POA: Diagnosis not present

## 2021-08-27 DIAGNOSIS — E119 Type 2 diabetes mellitus without complications: Secondary | ICD-10-CM | POA: Diagnosis not present

## 2021-08-27 DIAGNOSIS — I69391 Dysphagia following cerebral infarction: Secondary | ICD-10-CM | POA: Diagnosis not present

## 2021-08-27 DIAGNOSIS — E785 Hyperlipidemia, unspecified: Secondary | ICD-10-CM | POA: Diagnosis not present

## 2021-08-27 DIAGNOSIS — Z794 Long term (current) use of insulin: Secondary | ICD-10-CM | POA: Diagnosis not present

## 2021-08-27 DIAGNOSIS — I69354 Hemiplegia and hemiparesis following cerebral infarction affecting left non-dominant side: Secondary | ICD-10-CM | POA: Diagnosis not present

## 2021-08-27 DIAGNOSIS — I69322 Dysarthria following cerebral infarction: Secondary | ICD-10-CM | POA: Diagnosis not present

## 2021-08-27 DIAGNOSIS — I10 Essential (primary) hypertension: Secondary | ICD-10-CM | POA: Diagnosis not present

## 2021-08-29 ENCOUNTER — Telehealth: Payer: Self-pay | Admitting: Family Medicine

## 2021-08-29 DIAGNOSIS — I69391 Dysphagia following cerebral infarction: Secondary | ICD-10-CM | POA: Diagnosis not present

## 2021-08-29 DIAGNOSIS — I10 Essential (primary) hypertension: Secondary | ICD-10-CM | POA: Diagnosis not present

## 2021-08-29 DIAGNOSIS — I69354 Hemiplegia and hemiparesis following cerebral infarction affecting left non-dominant side: Secondary | ICD-10-CM | POA: Diagnosis not present

## 2021-08-29 DIAGNOSIS — I69322 Dysarthria following cerebral infarction: Secondary | ICD-10-CM | POA: Diagnosis not present

## 2021-08-29 DIAGNOSIS — M1990 Unspecified osteoarthritis, unspecified site: Secondary | ICD-10-CM | POA: Diagnosis not present

## 2021-08-29 DIAGNOSIS — E119 Type 2 diabetes mellitus without complications: Secondary | ICD-10-CM | POA: Diagnosis not present

## 2021-08-29 NOTE — Telephone Encounter (Signed)
Caller/Agency: [facility] Number: [phone] Requesting OT/PT/Skilled Nursing/Social Work/Speech Therapy: Speech Therapy Frequency: 1w x 5

## 2021-08-29 NOTE — Telephone Encounter (Signed)
Lvm with verbals.

## 2021-08-30 DIAGNOSIS — I69391 Dysphagia following cerebral infarction: Secondary | ICD-10-CM | POA: Diagnosis not present

## 2021-08-30 DIAGNOSIS — M1990 Unspecified osteoarthritis, unspecified site: Secondary | ICD-10-CM | POA: Diagnosis not present

## 2021-08-30 DIAGNOSIS — K5901 Slow transit constipation: Secondary | ICD-10-CM | POA: Diagnosis not present

## 2021-08-30 DIAGNOSIS — I10 Essential (primary) hypertension: Secondary | ICD-10-CM | POA: Diagnosis not present

## 2021-08-30 DIAGNOSIS — I69354 Hemiplegia and hemiparesis following cerebral infarction affecting left non-dominant side: Secondary | ICD-10-CM | POA: Diagnosis not present

## 2021-08-30 DIAGNOSIS — E785 Hyperlipidemia, unspecified: Secondary | ICD-10-CM | POA: Diagnosis not present

## 2021-08-30 DIAGNOSIS — I69854 Hemiplegia and hemiparesis following other cerebrovascular disease affecting left non-dominant side: Secondary | ICD-10-CM | POA: Diagnosis not present

## 2021-08-30 DIAGNOSIS — I69322 Dysarthria following cerebral infarction: Secondary | ICD-10-CM | POA: Diagnosis not present

## 2021-08-30 DIAGNOSIS — E119 Type 2 diabetes mellitus without complications: Secondary | ICD-10-CM | POA: Diagnosis not present

## 2021-09-05 DIAGNOSIS — I69322 Dysarthria following cerebral infarction: Secondary | ICD-10-CM | POA: Diagnosis not present

## 2021-09-05 DIAGNOSIS — M1990 Unspecified osteoarthritis, unspecified site: Secondary | ICD-10-CM | POA: Diagnosis not present

## 2021-09-05 DIAGNOSIS — E119 Type 2 diabetes mellitus without complications: Secondary | ICD-10-CM | POA: Diagnosis not present

## 2021-09-05 DIAGNOSIS — I69391 Dysphagia following cerebral infarction: Secondary | ICD-10-CM | POA: Diagnosis not present

## 2021-09-05 DIAGNOSIS — I69354 Hemiplegia and hemiparesis following cerebral infarction affecting left non-dominant side: Secondary | ICD-10-CM | POA: Diagnosis not present

## 2021-09-05 DIAGNOSIS — I10 Essential (primary) hypertension: Secondary | ICD-10-CM | POA: Diagnosis not present

## 2021-09-06 ENCOUNTER — Telehealth: Payer: Self-pay | Admitting: Family Medicine

## 2021-09-06 DIAGNOSIS — I10 Essential (primary) hypertension: Secondary | ICD-10-CM | POA: Diagnosis not present

## 2021-09-06 DIAGNOSIS — I69391 Dysphagia following cerebral infarction: Secondary | ICD-10-CM | POA: Diagnosis not present

## 2021-09-06 DIAGNOSIS — E119 Type 2 diabetes mellitus without complications: Secondary | ICD-10-CM | POA: Diagnosis not present

## 2021-09-06 DIAGNOSIS — M1990 Unspecified osteoarthritis, unspecified site: Secondary | ICD-10-CM | POA: Diagnosis not present

## 2021-09-06 DIAGNOSIS — I69322 Dysarthria following cerebral infarction: Secondary | ICD-10-CM | POA: Diagnosis not present

## 2021-09-06 DIAGNOSIS — I69354 Hemiplegia and hemiparesis following cerebral infarction affecting left non-dominant side: Secondary | ICD-10-CM | POA: Diagnosis not present

## 2021-09-06 NOTE — Telephone Encounter (Signed)
Melissa from [place] called to advise that the patient has lots more anxiety. She won't walk. She seems resistant to care, she's in the bed mostly unless her daughter is there. Patient is not able to process. . It seems like she is getting worse the farther out from the stroke they get which makes it hard to rehabilitate her. Melissa wants to know if there is something Dr. Lorelei Pont wanted to do to help ease her anxiety. She can be reached at [phone]. If medication is to be added, she said it would have to be called into [place] where she resides.

## 2021-09-07 NOTE — Telephone Encounter (Signed)
Please advise 

## 2021-09-08 NOTE — Telephone Encounter (Signed)
I called pt yesterday, no answer- number not in service.  Tried her daughter twice yesterday and again today- no answer.  LMOM yesterday and today with daughter.  Tried Melissa today- no answer, I left message with my number for her to all me back

## 2021-09-09 DIAGNOSIS — I69354 Hemiplegia and hemiparesis following cerebral infarction affecting left non-dominant side: Secondary | ICD-10-CM | POA: Diagnosis not present

## 2021-09-09 DIAGNOSIS — I10 Essential (primary) hypertension: Secondary | ICD-10-CM | POA: Diagnosis not present

## 2021-09-09 DIAGNOSIS — E119 Type 2 diabetes mellitus without complications: Secondary | ICD-10-CM | POA: Diagnosis not present

## 2021-09-09 DIAGNOSIS — I69322 Dysarthria following cerebral infarction: Secondary | ICD-10-CM | POA: Diagnosis not present

## 2021-09-09 DIAGNOSIS — M1990 Unspecified osteoarthritis, unspecified site: Secondary | ICD-10-CM | POA: Diagnosis not present

## 2021-09-09 DIAGNOSIS — I69391 Dysphagia following cerebral infarction: Secondary | ICD-10-CM | POA: Diagnosis not present

## 2021-09-09 MED ORDER — VENLAFAXINE HCL ER 37.5 MG PO CP24: 37.5000 mg | ORAL_CAPSULE | Freq: Every day | ORAL | 3 refills | Status: DC

## 2021-09-09 NOTE — Telephone Encounter (Signed)
Heard back from [place]- she states since her stroke Wanda [name] is so afraid of falling that PT/OT really cannot work with her, "the only place she feels safe is in the bed."  Wanda [name] has spoken with daughter who gave ok for medication for anxiety.    Called her facility- I can call rx into [facility] [place] in [place] for pt  [place] [phone]  Meds ordered this encounter  Medications   venlafaxine XR (EFFEXOR XR) 37.5 MG 24 hr capsule    Sig: Take 1 capsule (37.5 mg total) by mouth daily with breakfast. Increase to 75 mg after 1 week    Dispense:  60 capsule    Refill:  3

## 2021-09-09 NOTE — Addendum Note (Signed)
Addended by: Lamar Blinks C on: 09/09/2021 10:41 AM   Modules accepted: Orders

## 2021-09-12 DIAGNOSIS — I69354 Hemiplegia and hemiparesis following cerebral infarction affecting left non-dominant side: Secondary | ICD-10-CM | POA: Diagnosis not present

## 2021-09-12 DIAGNOSIS — I69322 Dysarthria following cerebral infarction: Secondary | ICD-10-CM | POA: Diagnosis not present

## 2021-09-12 DIAGNOSIS — I10 Essential (primary) hypertension: Secondary | ICD-10-CM | POA: Diagnosis not present

## 2021-09-12 DIAGNOSIS — E119 Type 2 diabetes mellitus without complications: Secondary | ICD-10-CM | POA: Diagnosis not present

## 2021-09-12 DIAGNOSIS — M1990 Unspecified osteoarthritis, unspecified site: Secondary | ICD-10-CM | POA: Diagnosis not present

## 2021-09-12 DIAGNOSIS — I69391 Dysphagia following cerebral infarction: Secondary | ICD-10-CM | POA: Diagnosis not present

## 2021-09-13 DIAGNOSIS — I69322 Dysarthria following cerebral infarction: Secondary | ICD-10-CM | POA: Diagnosis not present

## 2021-09-13 DIAGNOSIS — I69391 Dysphagia following cerebral infarction: Secondary | ICD-10-CM | POA: Diagnosis not present

## 2021-09-13 DIAGNOSIS — I10 Essential (primary) hypertension: Secondary | ICD-10-CM | POA: Diagnosis not present

## 2021-09-13 DIAGNOSIS — I69354 Hemiplegia and hemiparesis following cerebral infarction affecting left non-dominant side: Secondary | ICD-10-CM | POA: Diagnosis not present

## 2021-09-13 DIAGNOSIS — E119 Type 2 diabetes mellitus without complications: Secondary | ICD-10-CM | POA: Diagnosis not present

## 2021-09-13 DIAGNOSIS — M1990 Unspecified osteoarthritis, unspecified site: Secondary | ICD-10-CM | POA: Diagnosis not present

## 2021-09-16 DIAGNOSIS — I10 Essential (primary) hypertension: Secondary | ICD-10-CM | POA: Diagnosis not present

## 2021-09-16 DIAGNOSIS — I69354 Hemiplegia and hemiparesis following cerebral infarction affecting left non-dominant side: Secondary | ICD-10-CM | POA: Diagnosis not present

## 2021-09-16 DIAGNOSIS — I69322 Dysarthria following cerebral infarction: Secondary | ICD-10-CM | POA: Diagnosis not present

## 2021-09-16 DIAGNOSIS — M1990 Unspecified osteoarthritis, unspecified site: Secondary | ICD-10-CM | POA: Diagnosis not present

## 2021-09-16 DIAGNOSIS — I69391 Dysphagia following cerebral infarction: Secondary | ICD-10-CM | POA: Diagnosis not present

## 2021-09-16 DIAGNOSIS — E119 Type 2 diabetes mellitus without complications: Secondary | ICD-10-CM | POA: Diagnosis not present

## 2021-09-19 DIAGNOSIS — M1990 Unspecified osteoarthritis, unspecified site: Secondary | ICD-10-CM | POA: Diagnosis not present

## 2021-09-19 DIAGNOSIS — I69354 Hemiplegia and hemiparesis following cerebral infarction affecting left non-dominant side: Secondary | ICD-10-CM | POA: Diagnosis not present

## 2021-09-19 DIAGNOSIS — I69391 Dysphagia following cerebral infarction: Secondary | ICD-10-CM | POA: Diagnosis not present

## 2021-09-19 DIAGNOSIS — E119 Type 2 diabetes mellitus without complications: Secondary | ICD-10-CM | POA: Diagnosis not present

## 2021-09-19 DIAGNOSIS — I10 Essential (primary) hypertension: Secondary | ICD-10-CM | POA: Diagnosis not present

## 2021-09-19 DIAGNOSIS — I69322 Dysarthria following cerebral infarction: Secondary | ICD-10-CM | POA: Diagnosis not present

## 2021-09-23 ENCOUNTER — Telehealth: Payer: Self-pay | Admitting: Family Medicine

## 2021-09-23 NOTE — Telephone Encounter (Signed)
Caller/Agency: [facility] OT Callback Number: [phone] Requesting OT/PT/Skilled Nursing/Social Work/Speech Therapy: OT  Frequency: move appt for reassessment from this week to next week   Okay to leave voicemail for verbal.     Also, she wanted to pass on to Dr. Lorelei Pont that the patient is not doing any better. She is failure to thrive at this point. She is total assist while living at assisted facility. She only feels secure in the bed. Patient does not seem to be cognitively ready for OT. She is recommending that our office ask the facility to get her in for a follow up visit with Dr. Lorelei Pont to assess if there is anything else that can be done medically for her.

## 2021-09-26 DIAGNOSIS — E119 Type 2 diabetes mellitus without complications: Secondary | ICD-10-CM | POA: Diagnosis not present

## 2021-09-26 DIAGNOSIS — Z7902 Long term (current) use of antithrombotics/antiplatelets: Secondary | ICD-10-CM | POA: Diagnosis not present

## 2021-09-26 DIAGNOSIS — I10 Essential (primary) hypertension: Secondary | ICD-10-CM | POA: Diagnosis not present

## 2021-09-26 DIAGNOSIS — E785 Hyperlipidemia, unspecified: Secondary | ICD-10-CM | POA: Diagnosis not present

## 2021-09-26 DIAGNOSIS — I69354 Hemiplegia and hemiparesis following cerebral infarction affecting left non-dominant side: Secondary | ICD-10-CM | POA: Diagnosis not present

## 2021-09-26 DIAGNOSIS — Z7984 Long term (current) use of oral hypoglycemic drugs: Secondary | ICD-10-CM | POA: Diagnosis not present

## 2021-09-26 DIAGNOSIS — I69391 Dysphagia following cerebral infarction: Secondary | ICD-10-CM | POA: Diagnosis not present

## 2021-09-26 DIAGNOSIS — Z794 Long term (current) use of insulin: Secondary | ICD-10-CM | POA: Diagnosis not present

## 2021-09-26 DIAGNOSIS — I69322 Dysarthria following cerebral infarction: Secondary | ICD-10-CM | POA: Diagnosis not present

## 2021-09-26 DIAGNOSIS — Z7901 Long term (current) use of anticoagulants: Secondary | ICD-10-CM | POA: Diagnosis not present

## 2021-09-26 DIAGNOSIS — M1990 Unspecified osteoarthritis, unspecified site: Secondary | ICD-10-CM | POA: Diagnosis not present

## 2021-09-26 NOTE — Telephone Encounter (Signed)
See below

## 2021-09-27 DIAGNOSIS — K5901 Slow transit constipation: Secondary | ICD-10-CM | POA: Diagnosis not present

## 2021-09-27 DIAGNOSIS — I69854 Hemiplegia and hemiparesis following other cerebrovascular disease affecting left non-dominant side: Secondary | ICD-10-CM | POA: Diagnosis not present

## 2021-09-27 DIAGNOSIS — E119 Type 2 diabetes mellitus without complications: Secondary | ICD-10-CM | POA: Diagnosis not present

## 2021-09-27 DIAGNOSIS — I69354 Hemiplegia and hemiparesis following cerebral infarction affecting left non-dominant side: Secondary | ICD-10-CM | POA: Diagnosis not present

## 2021-09-27 DIAGNOSIS — I69322 Dysarthria following cerebral infarction: Secondary | ICD-10-CM | POA: Diagnosis not present

## 2021-09-27 DIAGNOSIS — I10 Essential (primary) hypertension: Secondary | ICD-10-CM | POA: Diagnosis not present

## 2021-09-27 DIAGNOSIS — I69391 Dysphagia following cerebral infarction: Secondary | ICD-10-CM | POA: Diagnosis not present

## 2021-09-27 DIAGNOSIS — E785 Hyperlipidemia, unspecified: Secondary | ICD-10-CM | POA: Diagnosis not present

## 2021-09-27 DIAGNOSIS — M1990 Unspecified osteoarthritis, unspecified site: Secondary | ICD-10-CM | POA: Diagnosis not present

## 2021-09-27 NOTE — Telephone Encounter (Signed)
Called Wanda [name] back- pt is [place] off [address] She is supposed to be doing PT and OT but she is really not participating [name] thinks having her see psychiatry would help which sounds like a great idea if I can arrange.  Will call her daughter and see what we have planned long term.  I have not see [name] since prior to her stroke

## 2021-09-27 NOTE — Telephone Encounter (Signed)
I called and spoke with Wanda [name] her daughter Pt is not making progress at [facility] but they are not sure how much is depression She is going to the dining room for meals for she is WC bound She can stand up "barely" with support She is living in a private room at [facility] They are preparing to see her home; she is not able to return there physically   Dr Leonie Man was her neurologist inpt- will touch base with him about long term plan for this patient.

## 2021-09-29 DIAGNOSIS — K5901 Slow transit constipation: Secondary | ICD-10-CM | POA: Diagnosis not present

## 2021-09-29 DIAGNOSIS — E119 Type 2 diabetes mellitus without complications: Secondary | ICD-10-CM | POA: Diagnosis not present

## 2021-09-29 DIAGNOSIS — I10 Essential (primary) hypertension: Secondary | ICD-10-CM | POA: Diagnosis not present

## 2021-09-29 DIAGNOSIS — I69354 Hemiplegia and hemiparesis following cerebral infarction affecting left non-dominant side: Secondary | ICD-10-CM | POA: Diagnosis not present

## 2021-09-29 NOTE — Telephone Encounter (Signed)
Got further information from [facility], her physical therapist. This fax number at [facility] the nursing director Sarina Ill is [phone] I will send a request to this number asking for the psychiatric nurse practitioner to visit with Tito Dine print out and fax

## 2021-10-03 DIAGNOSIS — I69354 Hemiplegia and hemiparesis following cerebral infarction affecting left non-dominant side: Secondary | ICD-10-CM | POA: Diagnosis not present

## 2021-10-03 DIAGNOSIS — I69322 Dysarthria following cerebral infarction: Secondary | ICD-10-CM | POA: Diagnosis not present

## 2021-10-03 DIAGNOSIS — M1990 Unspecified osteoarthritis, unspecified site: Secondary | ICD-10-CM | POA: Diagnosis not present

## 2021-10-03 DIAGNOSIS — I10 Essential (primary) hypertension: Secondary | ICD-10-CM | POA: Diagnosis not present

## 2021-10-03 DIAGNOSIS — E119 Type 2 diabetes mellitus without complications: Secondary | ICD-10-CM | POA: Diagnosis not present

## 2021-10-03 DIAGNOSIS — I69391 Dysphagia following cerebral infarction: Secondary | ICD-10-CM | POA: Diagnosis not present

## 2021-10-04 ENCOUNTER — Telehealth: Payer: Self-pay | Admitting: Neurology

## 2021-10-04 DIAGNOSIS — M1990 Unspecified osteoarthritis, unspecified site: Secondary | ICD-10-CM | POA: Diagnosis not present

## 2021-10-04 DIAGNOSIS — I69354 Hemiplegia and hemiparesis following cerebral infarction affecting left non-dominant side: Secondary | ICD-10-CM | POA: Diagnosis not present

## 2021-10-04 DIAGNOSIS — E119 Type 2 diabetes mellitus without complications: Secondary | ICD-10-CM | POA: Diagnosis not present

## 2021-10-04 DIAGNOSIS — I69322 Dysarthria following cerebral infarction: Secondary | ICD-10-CM | POA: Diagnosis not present

## 2021-10-04 DIAGNOSIS — I69391 Dysphagia following cerebral infarction: Secondary | ICD-10-CM | POA: Diagnosis not present

## 2021-10-04 DIAGNOSIS — I10 Essential (primary) hypertension: Secondary | ICD-10-CM | POA: Diagnosis not present

## 2021-10-04 NOTE — Telephone Encounter (Signed)
LVM with pt's daughter asking her to call back and schedule f/u with Dr. Leonie Man (per staff message from [place], [place]).

## 2021-10-05 DIAGNOSIS — M1990 Unspecified osteoarthritis, unspecified site: Secondary | ICD-10-CM | POA: Diagnosis not present

## 2021-10-05 DIAGNOSIS — I10 Essential (primary) hypertension: Secondary | ICD-10-CM | POA: Diagnosis not present

## 2021-10-05 DIAGNOSIS — I69322 Dysarthria following cerebral infarction: Secondary | ICD-10-CM | POA: Diagnosis not present

## 2021-10-05 DIAGNOSIS — I69354 Hemiplegia and hemiparesis following cerebral infarction affecting left non-dominant side: Secondary | ICD-10-CM | POA: Diagnosis not present

## 2021-10-05 DIAGNOSIS — E119 Type 2 diabetes mellitus without complications: Secondary | ICD-10-CM | POA: Diagnosis not present

## 2021-10-05 DIAGNOSIS — I69391 Dysphagia following cerebral infarction: Secondary | ICD-10-CM | POA: Diagnosis not present

## 2021-10-10 DIAGNOSIS — E119 Type 2 diabetes mellitus without complications: Secondary | ICD-10-CM | POA: Diagnosis not present

## 2021-10-10 DIAGNOSIS — I69391 Dysphagia following cerebral infarction: Secondary | ICD-10-CM | POA: Diagnosis not present

## 2021-10-10 DIAGNOSIS — I69354 Hemiplegia and hemiparesis following cerebral infarction affecting left non-dominant side: Secondary | ICD-10-CM | POA: Diagnosis not present

## 2021-10-10 DIAGNOSIS — I69322 Dysarthria following cerebral infarction: Secondary | ICD-10-CM | POA: Diagnosis not present

## 2021-10-10 DIAGNOSIS — I10 Essential (primary) hypertension: Secondary | ICD-10-CM | POA: Diagnosis not present

## 2021-10-10 DIAGNOSIS — M1990 Unspecified osteoarthritis, unspecified site: Secondary | ICD-10-CM | POA: Diagnosis not present

## 2021-10-12 DIAGNOSIS — E119 Type 2 diabetes mellitus without complications: Secondary | ICD-10-CM | POA: Diagnosis not present

## 2021-10-12 DIAGNOSIS — M1990 Unspecified osteoarthritis, unspecified site: Secondary | ICD-10-CM | POA: Diagnosis not present

## 2021-10-12 DIAGNOSIS — I69322 Dysarthria following cerebral infarction: Secondary | ICD-10-CM | POA: Diagnosis not present

## 2021-10-12 DIAGNOSIS — I69354 Hemiplegia and hemiparesis following cerebral infarction affecting left non-dominant side: Secondary | ICD-10-CM | POA: Diagnosis not present

## 2021-10-12 DIAGNOSIS — I69391 Dysphagia following cerebral infarction: Secondary | ICD-10-CM | POA: Diagnosis not present

## 2021-10-12 DIAGNOSIS — I10 Essential (primary) hypertension: Secondary | ICD-10-CM | POA: Diagnosis not present

## 2021-10-17 DIAGNOSIS — I69322 Dysarthria following cerebral infarction: Secondary | ICD-10-CM | POA: Diagnosis not present

## 2021-10-17 DIAGNOSIS — I10 Essential (primary) hypertension: Secondary | ICD-10-CM | POA: Diagnosis not present

## 2021-10-17 DIAGNOSIS — M1990 Unspecified osteoarthritis, unspecified site: Secondary | ICD-10-CM | POA: Diagnosis not present

## 2021-10-17 DIAGNOSIS — I69354 Hemiplegia and hemiparesis following cerebral infarction affecting left non-dominant side: Secondary | ICD-10-CM | POA: Diagnosis not present

## 2021-10-17 DIAGNOSIS — E119 Type 2 diabetes mellitus without complications: Secondary | ICD-10-CM | POA: Diagnosis not present

## 2021-10-17 DIAGNOSIS — I69391 Dysphagia following cerebral infarction: Secondary | ICD-10-CM | POA: Diagnosis not present

## 2021-10-21 DIAGNOSIS — I10 Essential (primary) hypertension: Secondary | ICD-10-CM | POA: Diagnosis not present

## 2021-10-21 DIAGNOSIS — I69322 Dysarthria following cerebral infarction: Secondary | ICD-10-CM | POA: Diagnosis not present

## 2021-10-21 DIAGNOSIS — I69391 Dysphagia following cerebral infarction: Secondary | ICD-10-CM | POA: Diagnosis not present

## 2021-10-21 DIAGNOSIS — I69354 Hemiplegia and hemiparesis following cerebral infarction affecting left non-dominant side: Secondary | ICD-10-CM | POA: Diagnosis not present

## 2021-10-21 DIAGNOSIS — E119 Type 2 diabetes mellitus without complications: Secondary | ICD-10-CM | POA: Diagnosis not present

## 2021-10-21 DIAGNOSIS — M1990 Unspecified osteoarthritis, unspecified site: Secondary | ICD-10-CM | POA: Diagnosis not present

## 2021-10-25 DIAGNOSIS — I69322 Dysarthria following cerebral infarction: Secondary | ICD-10-CM | POA: Diagnosis not present

## 2021-10-25 DIAGNOSIS — I69391 Dysphagia following cerebral infarction: Secondary | ICD-10-CM | POA: Diagnosis not present

## 2021-10-25 DIAGNOSIS — I10 Essential (primary) hypertension: Secondary | ICD-10-CM | POA: Diagnosis not present

## 2021-10-25 DIAGNOSIS — M1990 Unspecified osteoarthritis, unspecified site: Secondary | ICD-10-CM | POA: Diagnosis not present

## 2021-10-25 DIAGNOSIS — E785 Hyperlipidemia, unspecified: Secondary | ICD-10-CM | POA: Diagnosis not present

## 2021-10-25 DIAGNOSIS — K5901 Slow transit constipation: Secondary | ICD-10-CM | POA: Diagnosis not present

## 2021-10-25 DIAGNOSIS — E119 Type 2 diabetes mellitus without complications: Secondary | ICD-10-CM | POA: Diagnosis not present

## 2021-10-25 DIAGNOSIS — I69354 Hemiplegia and hemiparesis following cerebral infarction affecting left non-dominant side: Secondary | ICD-10-CM | POA: Diagnosis not present

## 2021-10-26 DIAGNOSIS — Z7901 Long term (current) use of anticoagulants: Secondary | ICD-10-CM | POA: Diagnosis not present

## 2021-10-26 DIAGNOSIS — Z993 Dependence on wheelchair: Secondary | ICD-10-CM | POA: Diagnosis not present

## 2021-10-26 DIAGNOSIS — I69391 Dysphagia following cerebral infarction: Secondary | ICD-10-CM | POA: Diagnosis not present

## 2021-10-26 DIAGNOSIS — Z7902 Long term (current) use of antithrombotics/antiplatelets: Secondary | ICD-10-CM | POA: Diagnosis not present

## 2021-10-26 DIAGNOSIS — E785 Hyperlipidemia, unspecified: Secondary | ICD-10-CM | POA: Diagnosis not present

## 2021-10-26 DIAGNOSIS — Z7984 Long term (current) use of oral hypoglycemic drugs: Secondary | ICD-10-CM | POA: Diagnosis not present

## 2021-10-26 DIAGNOSIS — I10 Essential (primary) hypertension: Secondary | ICD-10-CM | POA: Diagnosis not present

## 2021-10-26 DIAGNOSIS — E119 Type 2 diabetes mellitus without complications: Secondary | ICD-10-CM | POA: Diagnosis not present

## 2021-10-26 DIAGNOSIS — Z794 Long term (current) use of insulin: Secondary | ICD-10-CM | POA: Diagnosis not present

## 2021-10-26 DIAGNOSIS — Z9181 History of falling: Secondary | ICD-10-CM | POA: Diagnosis not present

## 2021-10-26 DIAGNOSIS — M1990 Unspecified osteoarthritis, unspecified site: Secondary | ICD-10-CM | POA: Diagnosis not present

## 2021-10-26 DIAGNOSIS — I69322 Dysarthria following cerebral infarction: Secondary | ICD-10-CM | POA: Diagnosis not present

## 2021-10-26 DIAGNOSIS — I69354 Hemiplegia and hemiparesis following cerebral infarction affecting left non-dominant side: Secondary | ICD-10-CM | POA: Diagnosis not present

## 2021-10-31 DIAGNOSIS — I69354 Hemiplegia and hemiparesis following cerebral infarction affecting left non-dominant side: Secondary | ICD-10-CM | POA: Diagnosis not present

## 2021-10-31 DIAGNOSIS — M1990 Unspecified osteoarthritis, unspecified site: Secondary | ICD-10-CM | POA: Diagnosis not present

## 2021-10-31 DIAGNOSIS — I69391 Dysphagia following cerebral infarction: Secondary | ICD-10-CM | POA: Diagnosis not present

## 2021-10-31 DIAGNOSIS — E119 Type 2 diabetes mellitus without complications: Secondary | ICD-10-CM | POA: Diagnosis not present

## 2021-10-31 DIAGNOSIS — I10 Essential (primary) hypertension: Secondary | ICD-10-CM | POA: Diagnosis not present

## 2021-10-31 DIAGNOSIS — I69322 Dysarthria following cerebral infarction: Secondary | ICD-10-CM | POA: Diagnosis not present

## 2021-11-03 ENCOUNTER — Other Ambulatory Visit: Payer: Medicare Other

## 2021-11-07 ENCOUNTER — Telehealth: Payer: Self-pay | Admitting: Family Medicine

## 2021-11-07 DIAGNOSIS — I69391 Dysphagia following cerebral infarction: Secondary | ICD-10-CM | POA: Diagnosis not present

## 2021-11-07 DIAGNOSIS — I69322 Dysarthria following cerebral infarction: Secondary | ICD-10-CM | POA: Diagnosis not present

## 2021-11-07 DIAGNOSIS — M1990 Unspecified osteoarthritis, unspecified site: Secondary | ICD-10-CM | POA: Diagnosis not present

## 2021-11-07 DIAGNOSIS — E119 Type 2 diabetes mellitus without complications: Secondary | ICD-10-CM | POA: Diagnosis not present

## 2021-11-07 DIAGNOSIS — I10 Essential (primary) hypertension: Secondary | ICD-10-CM | POA: Diagnosis not present

## 2021-11-07 DIAGNOSIS — I69354 Hemiplegia and hemiparesis following cerebral infarction affecting left non-dominant side: Secondary | ICD-10-CM | POA: Diagnosis not present

## 2021-11-07 NOTE — Telephone Encounter (Signed)
Wanda [name] from [facility] is requesting VO for physical therapy eval. Her number [phone] (secure vm).

## 2021-11-08 NOTE — Telephone Encounter (Signed)
Left vm with orders.

## 2021-11-14 DIAGNOSIS — I69391 Dysphagia following cerebral infarction: Secondary | ICD-10-CM | POA: Diagnosis not present

## 2021-11-14 DIAGNOSIS — I69354 Hemiplegia and hemiparesis following cerebral infarction affecting left non-dominant side: Secondary | ICD-10-CM | POA: Diagnosis not present

## 2021-11-14 DIAGNOSIS — I10 Essential (primary) hypertension: Secondary | ICD-10-CM | POA: Diagnosis not present

## 2021-11-14 DIAGNOSIS — I69322 Dysarthria following cerebral infarction: Secondary | ICD-10-CM | POA: Diagnosis not present

## 2021-11-14 DIAGNOSIS — E119 Type 2 diabetes mellitus without complications: Secondary | ICD-10-CM | POA: Diagnosis not present

## 2021-11-14 DIAGNOSIS — M1990 Unspecified osteoarthritis, unspecified site: Secondary | ICD-10-CM | POA: Diagnosis not present

## 2021-11-15 DIAGNOSIS — I69354 Hemiplegia and hemiparesis following cerebral infarction affecting left non-dominant side: Secondary | ICD-10-CM | POA: Diagnosis not present

## 2021-11-15 DIAGNOSIS — I69322 Dysarthria following cerebral infarction: Secondary | ICD-10-CM | POA: Diagnosis not present

## 2021-11-15 DIAGNOSIS — M1990 Unspecified osteoarthritis, unspecified site: Secondary | ICD-10-CM | POA: Diagnosis not present

## 2021-11-15 DIAGNOSIS — E119 Type 2 diabetes mellitus without complications: Secondary | ICD-10-CM | POA: Diagnosis not present

## 2021-11-15 DIAGNOSIS — I10 Essential (primary) hypertension: Secondary | ICD-10-CM | POA: Diagnosis not present

## 2021-11-15 DIAGNOSIS — I69391 Dysphagia following cerebral infarction: Secondary | ICD-10-CM | POA: Diagnosis not present

## 2021-11-22 ENCOUNTER — Telehealth: Payer: Self-pay | Admitting: Family Medicine

## 2021-11-22 DIAGNOSIS — I69322 Dysarthria following cerebral infarction: Secondary | ICD-10-CM | POA: Diagnosis not present

## 2021-11-22 DIAGNOSIS — M1990 Unspecified osteoarthritis, unspecified site: Secondary | ICD-10-CM | POA: Diagnosis not present

## 2021-11-22 DIAGNOSIS — I69391 Dysphagia following cerebral infarction: Secondary | ICD-10-CM | POA: Diagnosis not present

## 2021-11-22 DIAGNOSIS — I69854 Hemiplegia and hemiparesis following other cerebrovascular disease affecting left non-dominant side: Secondary | ICD-10-CM | POA: Diagnosis not present

## 2021-11-22 DIAGNOSIS — K5901 Slow transit constipation: Secondary | ICD-10-CM | POA: Diagnosis not present

## 2021-11-22 DIAGNOSIS — I69354 Hemiplegia and hemiparesis following cerebral infarction affecting left non-dominant side: Secondary | ICD-10-CM | POA: Diagnosis not present

## 2021-11-22 DIAGNOSIS — I10 Essential (primary) hypertension: Secondary | ICD-10-CM | POA: Diagnosis not present

## 2021-11-22 DIAGNOSIS — E785 Hyperlipidemia, unspecified: Secondary | ICD-10-CM | POA: Diagnosis not present

## 2021-11-22 DIAGNOSIS — E119 Type 2 diabetes mellitus without complications: Secondary | ICD-10-CM | POA: Diagnosis not present

## 2021-11-22 NOTE — Telephone Encounter (Signed)
Caller/Agency: [facility] Number: [phone] Requesting OT/PT/Skilled Nursing/Social Work/Speech Therapy: OT Frequency:  OT 1 w 5, re certification Speech therapy eval

## 2021-11-22 NOTE — Telephone Encounter (Signed)
[facility] aware of verbal orders

## 2021-11-25 DIAGNOSIS — I10 Essential (primary) hypertension: Secondary | ICD-10-CM | POA: Diagnosis not present

## 2021-11-25 DIAGNOSIS — Z794 Long term (current) use of insulin: Secondary | ICD-10-CM | POA: Diagnosis not present

## 2021-11-25 DIAGNOSIS — M1990 Unspecified osteoarthritis, unspecified site: Secondary | ICD-10-CM | POA: Diagnosis not present

## 2021-11-25 DIAGNOSIS — Z7901 Long term (current) use of anticoagulants: Secondary | ICD-10-CM | POA: Diagnosis not present

## 2021-11-25 DIAGNOSIS — E119 Type 2 diabetes mellitus without complications: Secondary | ICD-10-CM | POA: Diagnosis not present

## 2021-11-25 DIAGNOSIS — I69322 Dysarthria following cerebral infarction: Secondary | ICD-10-CM | POA: Diagnosis not present

## 2021-11-25 DIAGNOSIS — Z9181 History of falling: Secondary | ICD-10-CM | POA: Diagnosis not present

## 2021-11-25 DIAGNOSIS — Z7984 Long term (current) use of oral hypoglycemic drugs: Secondary | ICD-10-CM | POA: Diagnosis not present

## 2021-11-25 DIAGNOSIS — Z7902 Long term (current) use of antithrombotics/antiplatelets: Secondary | ICD-10-CM | POA: Diagnosis not present

## 2021-11-25 DIAGNOSIS — E785 Hyperlipidemia, unspecified: Secondary | ICD-10-CM | POA: Diagnosis not present

## 2021-11-25 DIAGNOSIS — I69354 Hemiplegia and hemiparesis following cerebral infarction affecting left non-dominant side: Secondary | ICD-10-CM | POA: Diagnosis not present

## 2021-11-25 DIAGNOSIS — Z993 Dependence on wheelchair: Secondary | ICD-10-CM | POA: Diagnosis not present

## 2021-11-25 DIAGNOSIS — I69391 Dysphagia following cerebral infarction: Secondary | ICD-10-CM | POA: Diagnosis not present

## 2021-11-28 DIAGNOSIS — I69322 Dysarthria following cerebral infarction: Secondary | ICD-10-CM | POA: Diagnosis not present

## 2021-11-28 DIAGNOSIS — E119 Type 2 diabetes mellitus without complications: Secondary | ICD-10-CM | POA: Diagnosis not present

## 2021-11-28 DIAGNOSIS — M1990 Unspecified osteoarthritis, unspecified site: Secondary | ICD-10-CM | POA: Diagnosis not present

## 2021-11-28 DIAGNOSIS — I10 Essential (primary) hypertension: Secondary | ICD-10-CM | POA: Diagnosis not present

## 2021-11-28 DIAGNOSIS — I69391 Dysphagia following cerebral infarction: Secondary | ICD-10-CM | POA: Diagnosis not present

## 2021-11-28 DIAGNOSIS — I69354 Hemiplegia and hemiparesis following cerebral infarction affecting left non-dominant side: Secondary | ICD-10-CM | POA: Diagnosis not present

## 2021-11-30 ENCOUNTER — Emergency Department (HOSPITAL_COMMUNITY): Payer: Medicare Other

## 2021-11-30 ENCOUNTER — Inpatient Hospital Stay (HOSPITAL_COMMUNITY)
Admission: EM | Admit: 2021-11-30 | Discharge: 2021-12-02 | DRG: 300 | Disposition: A | Discharge status: Skilled Nursing Facility | Payer: Medicare Other | Source: Skilled Nursing Facility | Attending: Internal Medicine | Admitting: Internal Medicine

## 2021-11-30 ENCOUNTER — Encounter (HOSPITAL_COMMUNITY): Payer: Self-pay

## 2021-11-30 ENCOUNTER — Other Ambulatory Visit: Payer: Self-pay

## 2021-11-30 DIAGNOSIS — R410 Disorientation, unspecified: Secondary | ICD-10-CM | POA: Diagnosis not present

## 2021-11-30 DIAGNOSIS — K862 Cyst of pancreas: Secondary | ICD-10-CM | POA: Diagnosis present

## 2021-11-30 DIAGNOSIS — I959 Hypotension, unspecified: Secondary | ICD-10-CM | POA: Diagnosis not present

## 2021-11-30 DIAGNOSIS — Z85828 Personal history of other malignant neoplasm of skin: Secondary | ICD-10-CM

## 2021-11-30 DIAGNOSIS — Z8673 Personal history of transient ischemic attack (TIA), and cerebral infarction without residual deficits: Secondary | ICD-10-CM | POA: Diagnosis not present

## 2021-11-30 DIAGNOSIS — S0990XA Unspecified injury of head, initial encounter: Secondary | ICD-10-CM | POA: Diagnosis not present

## 2021-11-30 DIAGNOSIS — S199XXA Unspecified injury of neck, initial encounter: Secondary | ICD-10-CM | POA: Diagnosis not present

## 2021-11-30 DIAGNOSIS — Z794 Long term (current) use of insulin: Secondary | ICD-10-CM

## 2021-11-30 DIAGNOSIS — G319 Degenerative disease of nervous system, unspecified: Secondary | ICD-10-CM | POA: Diagnosis not present

## 2021-11-30 DIAGNOSIS — F39 Unspecified mood [affective] disorder: Secondary | ICD-10-CM | POA: Insufficient documentation

## 2021-11-30 DIAGNOSIS — Z8249 Family history of ischemic heart disease and other diseases of the circulatory system: Secondary | ICD-10-CM | POA: Diagnosis not present

## 2021-11-30 DIAGNOSIS — Z91013 Allergy to seafood: Secondary | ICD-10-CM | POA: Diagnosis not present

## 2021-11-30 DIAGNOSIS — Z79899 Other long term (current) drug therapy: Secondary | ICD-10-CM

## 2021-11-30 DIAGNOSIS — I700 Atherosclerosis of aorta: Secondary | ICD-10-CM | POA: Diagnosis not present

## 2021-11-30 DIAGNOSIS — Z886 Allergy status to analgesic agent status: Secondary | ICD-10-CM

## 2021-11-30 DIAGNOSIS — E042 Nontoxic multinodular goiter: Secondary | ICD-10-CM | POA: Diagnosis present

## 2021-11-30 DIAGNOSIS — E1165 Type 2 diabetes mellitus with hyperglycemia: Secondary | ICD-10-CM | POA: Diagnosis present

## 2021-11-30 DIAGNOSIS — I824Y2 Acute embolism and thrombosis of unspecified deep veins of left proximal lower extremity: Principal | ICD-10-CM

## 2021-11-30 DIAGNOSIS — E669 Obesity, unspecified: Secondary | ICD-10-CM | POA: Diagnosis present

## 2021-11-30 DIAGNOSIS — F0393 Unspecified dementia, unspecified severity, with mood disturbance: Secondary | ICD-10-CM | POA: Diagnosis present

## 2021-11-30 DIAGNOSIS — Z6831 Body mass index (BMI) 31.0-31.9, adult: Secondary | ICD-10-CM

## 2021-11-30 DIAGNOSIS — R4182 Altered mental status, unspecified: Secondary | ICD-10-CM | POA: Diagnosis not present

## 2021-11-30 DIAGNOSIS — E785 Hyperlipidemia, unspecified: Secondary | ICD-10-CM | POA: Diagnosis present

## 2021-11-30 DIAGNOSIS — R748 Abnormal levels of other serum enzymes: Secondary | ICD-10-CM | POA: Diagnosis not present

## 2021-11-30 DIAGNOSIS — I10 Essential (primary) hypertension: Secondary | ICD-10-CM | POA: Diagnosis not present

## 2021-11-30 DIAGNOSIS — I739 Peripheral vascular disease, unspecified: Secondary | ICD-10-CM | POA: Diagnosis not present

## 2021-11-30 DIAGNOSIS — Z7984 Long term (current) use of oral hypoglycemic drugs: Secondary | ICD-10-CM | POA: Diagnosis not present

## 2021-11-30 DIAGNOSIS — I82409 Acute embolism and thrombosis of unspecified deep veins of unspecified lower extremity: Secondary | ICD-10-CM | POA: Diagnosis present

## 2021-11-30 DIAGNOSIS — S3991XA Unspecified injury of abdomen, initial encounter: Secondary | ICD-10-CM | POA: Diagnosis not present

## 2021-11-30 DIAGNOSIS — F05 Delirium due to known physiological condition: Secondary | ICD-10-CM | POA: Diagnosis present

## 2021-11-30 DIAGNOSIS — Z803 Family history of malignant neoplasm of breast: Secondary | ICD-10-CM | POA: Diagnosis not present

## 2021-11-30 DIAGNOSIS — I82432 Acute embolism and thrombosis of left popliteal vein: Secondary | ICD-10-CM | POA: Diagnosis not present

## 2021-11-30 DIAGNOSIS — Z888 Allergy status to other drugs, medicaments and biological substances status: Secondary | ICD-10-CM

## 2021-11-30 DIAGNOSIS — I82412 Acute embolism and thrombosis of left femoral vein: Principal | ICD-10-CM | POA: Diagnosis present

## 2021-11-30 DIAGNOSIS — R0689 Other abnormalities of breathing: Secondary | ICD-10-CM | POA: Diagnosis not present

## 2021-11-30 DIAGNOSIS — W19XXXA Unspecified fall, initial encounter: Secondary | ICD-10-CM | POA: Diagnosis not present

## 2021-11-30 DIAGNOSIS — Z7401 Bed confinement status: Secondary | ICD-10-CM | POA: Diagnosis not present

## 2021-11-30 DIAGNOSIS — I82890 Acute embolism and thrombosis of other specified veins: Secondary | ICD-10-CM | POA: Diagnosis not present

## 2021-11-30 DIAGNOSIS — S299XXA Unspecified injury of thorax, initial encounter: Secondary | ICD-10-CM | POA: Diagnosis not present

## 2021-11-30 DIAGNOSIS — E119 Type 2 diabetes mellitus without complications: Secondary | ICD-10-CM

## 2021-11-30 DIAGNOSIS — Z7902 Long term (current) use of antithrombotics/antiplatelets: Secondary | ICD-10-CM

## 2021-11-30 DIAGNOSIS — S3993XA Unspecified injury of pelvis, initial encounter: Secondary | ICD-10-CM | POA: Diagnosis not present

## 2021-11-30 DIAGNOSIS — Z96653 Presence of artificial knee joint, bilateral: Secondary | ICD-10-CM | POA: Diagnosis present

## 2021-11-30 LAB — COMPREHENSIVE METABOLIC PANEL
ALT: 110 U/L — ABNORMAL HIGH (ref 0–44)
AST: 175 U/L — ABNORMAL HIGH (ref 15–41)
Albumin: 4 g/dL (ref 3.5–5.0)
Alkaline Phosphatase: 89 U/L (ref 38–126)
Anion gap: 13 (ref 5–15)
BUN: 9 mg/dL (ref 8–23)
CO2: 21 mmol/L — ABNORMAL LOW (ref 22–32)
Calcium: 9.7 mg/dL (ref 8.9–10.3)
Chloride: 100 mmol/L (ref 98–111)
Creatinine, Ser: 0.99 mg/dL (ref 0.44–1.00)
GFR, Estimated: 57 mL/min — ABNORMAL LOW (ref >60)
Glucose, Bld: 362 mg/dL — ABNORMAL HIGH (ref 70–99)
Potassium: 4 mmol/L (ref 3.5–5.1)
Sodium: 134 mmol/L — ABNORMAL LOW (ref 135–145)
Total Bilirubin: 1 mg/dL (ref 0.3–1.2)
Total Protein: 7.2 g/dL (ref 6.5–8.1)

## 2021-11-30 LAB — URINALYSIS, ROUTINE W REFLEX MICROSCOPIC
Bilirubin Urine: NEGATIVE
Glucose, UA: >=500 mg/dL — AB
Hgb urine dipstick: NEGATIVE
Ketones, ur: NEGATIVE mg/dL
Leukocytes,Ua: NEGATIVE
Nitrite: NEGATIVE
Protein, ur: NEGATIVE mg/dL
Specific Gravity, Urine: 1.003 — ABNORMAL LOW (ref 1.005–1.030)
pH: 7 (ref 5.0–8.0)

## 2021-11-30 LAB — CBC WITH DIFFERENTIAL/PLATELET
Abs Immature Granulocytes: 0.03 10*3/uL (ref 0.00–0.07)
Basophils Absolute: 0 10*3/uL (ref 0.0–0.1)
Basophils Relative: 0 %
Eosinophils Absolute: 0 10*3/uL (ref 0.0–0.5)
Eosinophils Relative: 1 %
HCT: 39.9 % (ref 36.0–46.0)
Hemoglobin: 13.5 g/dL (ref 12.0–15.0)
Immature Granulocytes: 0 %
Lymphocytes Relative: 23 %
Lymphs Abs: 1.7 10*3/uL (ref 0.7–4.0)
MCH: 29.9 pg (ref 26.0–34.0)
MCHC: 33.8 g/dL (ref 30.0–36.0)
MCV: 88.3 fL (ref 80.0–100.0)
Monocytes Absolute: 0.7 10*3/uL (ref 0.1–1.0)
Monocytes Relative: 10 %
Neutro Abs: 4.9 10*3/uL (ref 1.7–7.7)
Neutrophils Relative %: 66 %
Platelets: 236 10*3/uL (ref 150–400)
RBC: 4.52 MIL/uL (ref 3.87–5.11)
RDW: 13 % (ref 11.5–15.5)
WBC: 7.4 10*3/uL (ref 4.0–10.5)
nRBC: 0 % (ref 0.0–0.2)

## 2021-11-30 MED ORDER — IOHEXOL 350 MG/ML SOLN
75.0000 mL | Freq: Once | INTRAVENOUS | Status: AC | PRN
  Administered 2021-11-30: 75 mL via INTRAVENOUS

## 2021-11-30 MED ORDER — LORAZEPAM 2 MG/ML IJ SOLN
1.0000 mg | Freq: Once | INTRAMUSCULAR | Status: DC
  Filled 2021-11-30: qty 1

## 2021-11-30 MED ORDER — HEPARIN (PORCINE) 25000 UT/250ML-% IV SOLN
1100.0000 [IU]/h | INTRAVENOUS | Status: AC
  Administered 2021-11-30: 1100 [IU]/h via INTRAVENOUS
  Filled 2021-11-30: qty 250

## 2021-11-30 MED ORDER — HEPARIN BOLUS VIA INFUSION
3900.0000 [IU] | Freq: Once | INTRAVENOUS | Status: AC
  Administered 2021-11-30: 3900 [IU] via INTRAVENOUS
  Filled 2021-11-30: qty 3900

## 2021-11-30 NOTE — Progress Notes (Signed)
ANTICOAGULATION CONSULT NOTE - Initial Consult  Pharmacy Consult for heparin Indication: DVT  Allergies  Allergen Reactions   Ace Inhibitors Diarrhea, Swelling, Other (See Comments) and Cough    Pt had cough and diarrhea with first few doses of medication; also had swelling of right eyelid. Stopped medication on 03/17/11.   Alendronate Sodium Other (See Comments)    "Made my whole body hurt"   Aspirin Hives   Crestor [Rosuvastatin] Other (See Comments)    "Made my whole body hurt"   Lipitor [Atorvastatin Calcium] Other (See Comments)    "Made my whole body hurt"   Metformin And Related     Severe abdominal pain   Shellfish Allergy Nausea Only    Intolerant of fresh shellfish, reports the reaction is GI upset, denies hives, denies any swelling  Reports that she can tolerate canned seafood.     Patient Measurements: Height: '5\' 1"'$  (154.9 cm) Weight: 75.8 kg (167 lb) IBW/kg (Calculated) : 47.8 Heparin Dosing Weight: 64.6 kg  Vital Signs: Temp: 97.9 F (36.6 C) (11/22 1913) BP: 142/88 (11/22 1945) Pulse Rate: 91 (11/22 2130)  Labs: Recent Labs    11/30/21 1906  HGB 13.5  HCT 39.9  PLT 236  CREATININE 0.99    Estimated Creatinine Clearance: 41.5 mL/min (by C-G formula based on SCr of 0.99 mg/dL).   Medical History: Past Medical History:  Diagnosis Date   Arthritis    Carotid stenosis 11/2018   Cataract    surgery to remove   Diabetes mellitus    type 2   Hyperlipidemia    Hypertension    Skin cancer    Removed from face   Stroke [facility]) 09/19/2018   Urgency of urination    Urinary leakage    Assessment: 81 year old female presenting for mechanical fall. History of CVA in 07/2021, no AC PTA.  CT scan shows DVT in left leg. Pharmacy has been consulted to dose heparin.  Goal of Therapy:  Heparin level 0.3-0.7 units/ml Monitor platelets by anticoagulation protocol: Yes   Plan:  Give heparin 3900 units x1 Start heparin infusion at 1100 units/hour Check 8  hour heparin level Monitor daily heparin levels, CBC, and signs/symptoms of bleeding  Louanne Belton, PharmD, [facility] PGY1 Pharmacy Resident 11/30/2021 10:04 PM

## 2021-11-30 NOTE — Progress Notes (Signed)
   11/30/21 1841  Clinical Encounter Type  Visited With Patient  Visit Type ED;Trauma;Initial  Referral From Nurse Shon Hale, RN)  Consult/Referral To Chaplain Albertina Parr Morene Crocker)  Spiritual Encounters  Spiritual Needs Emotional   Responded to Emergency Department; Room 19 for Level 2 Trauma. Patient arrived via [facility] after falling off her chair and striking her head. Met [name]. Dennen at patient cart side. Chaplain provided meaningful presence and reflective listening. Patient stated that she has two living adult children and lives with daughter Wanda [name]. Ms. Ahlgrim stated that her son died one year ago from cancer. Patient is alert and oriented. No family present at this time. Chaplain will be available for consultation upon request of patient, family, or staff.  Chaplain Fryda Molenda, M.Min., [phone].

## 2021-11-30 NOTE — ED Notes (Signed)
Pt has ripped both IVs out. Bleeding stopped on own. Sites dressed. Pt states "this place is phony I'm ready to go home." Expresses some paranoid notions. Pt reoriented and lights dimmed-pt resting.

## 2021-11-30 NOTE — ED Provider Notes (Signed)
[facility] EMERGENCY DEPARTMENT Provider Note  CSN: [id number] Arrival date & time: 11/30/21 9892  Chief Complaint(s) Fall (Mech fall)  HPI Wanda [name] is a 81 y.o. female with PMH T2DM, HTN, HLD, recent CVA in July 2023 currently living in a SNF who presents emergency room for evaluation of a mechanical fall.  Patient is currently taking Plavix, got out of bed and fell striking her head on the ground.  She denies any chest pain, shortness of breath, abdominal pain, nausea, vomiting, headache, neck pain, numbness, tingling, weakness or other systemic or traumatic complaints.  Patient arrives as a level 2 trauma for fall blood thinners.   Past Medical History Past Medical History:  Diagnosis Date   Arthritis    Carotid stenosis 11/2018   Cataract    surgery to remove   Diabetes mellitus    type 2   Hyperlipidemia    Hypertension    Skin cancer    Removed from face   Stroke ([place]) 09/19/2018   Urgency of urination    Urinary leakage    Patient Active Problem List   Diagnosis Date Noted   Hypokalemia 07/26/2021   Fever 07/25/2021   Acute stroke due to ischemia ([place]) 07/24/2021   Carotid stenosis 11/13/2018   CVA (cerebral vascular accident) ([place]) 09/19/2018   Brainstem infarct, acute ([place])    Primary osteoarthritis of left knee 10/04/2015   Osteoporosis 08/23/2015   Multinodular goiter (nontoxic) 04/07/2014   Type 2 diabetes mellitus with hyperlipidemia ([place]) 10/09/2013   Insulin adverse reaction 09/27/2013   Senile nuclear sclerosis 08/14/2012   Essential hypertension 03/10/2011   Hyperlipidemia with target LDL less than 70 03/10/2011   Obesity (BMI 30-39.9) 03/10/2011   Home Medication(s) Prior to Admission medications   Medication Sig Start Date End Date Taking? Authorizing Provider  amLODipine (NORVASC) 5 MG tablet TAKE 1 TABLET(5 MG) BY MOUTH DAILY Patient taking differently: Take 5 mg by mouth daily. 03/16/21   Copland, Gay Filler, MD   blood glucose meter kit and supplies Dispense based on patient and insurance preference. Pt just needs meter 02/26/17   Copland, Gay Filler, MD  Blood Glucose Monitoring Suppl (ONE TOUCH ULTRA MINI) w/Device KIT Use to test blood sugar daily as instructed. Dx: E11.65 01/26/21   Copland, Gay Filler, MD  clopidogrel (PLAVIX) 75 MG tablet Take 1 tablet (75 mg total) by mouth daily. 03/16/21   Copland, Gay Filler, MD  glipiZIDE (GLUCOTROL XL) 5 MG 24 hr tablet Take 2 tabs in the morning and 1 tab in the evening. Patient taking differently: Take 5-10 mg by mouth See admin instructions. Take 2 tabs in the morning and 1 tab in the evening. 03/16/21   Copland, Gay Filler, MD  glucose blood (ONE TOUCH ULTRA TEST) test strip Test blood sugar 3 times a day. Dx code: 250.00 03/16/21   Copland, Gay Filler, MD  glucose blood (ONETOUCH ULTRA) test strip Use as instructed 06/08/21   Copland, Gay Filler, MD  insulin aspart (NOVOLOG) 100 UNIT/ML injection Inject 3 Units into the skin 3 (three) times daily with meals. 07/29/21   Dahal, Marlowe Aschoff, MD  insulin aspart (NOVOLOG) 100 UNIT/ML injection Inject 0-5 Units into the skin at bedtime. 07/29/21   Dahal, Marlowe Aschoff, MD  insulin aspart (NOVOLOG) 100 UNIT/ML injection Inject 0-20 Units into the skin 3 (three) times daily with meals. 07/29/21   Dahal, Marlowe Aschoff, MD  insulin glargine-yfgn (SEMGLEE) 100 UNIT/ML injection Inject 0.3 mLs (30 Units total) into the skin daily.  07/30/21   Dahal, Marlowe Aschoff, MD  Insulin Pen Needle (BD PEN NEEDLE NANO U/F) 32G X 4 MM MISC USE TO INJECT INSULIN ONCE DAILY 08/31/16   Copland, Gay Filler, MD  Insulin Syringe-Needle U-100 (INSULIN SYRINGE .5CC/31GX5/16") 31G X 5/16" 0.5 ML MISC Use to inject insulin 1 time daily. 09/23/14   Philemon Kingdom, MD  Lancets MISC 1 each by Does not apply route 3 (three) times daily. Dx: E11.65 01/26/21   Copland, Gay Filler, MD  senna-docusate (SENOKOT-S) 8.6-50 MG tablet Take 1 tablet by mouth at bedtime as needed for moderate  constipation. 07/29/21   Terrilee Croak, MD  venlafaxine XR (EFFEXOR XR) 37.5 MG 24 hr capsule Take 1 capsule (37.5 mg total) by mouth daily with breakfast. Increase to 75 mg after 1 week 09/09/21   Copland, Gay Filler, MD  lisinopril (PRINIVIL,ZESTRIL) 20 MG tablet Take 20 mg by mouth daily.   03/23/11  [provider]                                                                                                                                    Past Surgical History Past Surgical History:  Procedure Laterality Date   ABDOMINAL HYSTERECTOMY  early 80's   total   CAROTID ENDARTERECTOMY Left 11/13/2018   CARPAL TUNNEL RELEASE Bilateral 1980 and 1981   both hands   ENDARTERECTOMY Left 11/13/2018   Procedure: Left Carotid Artery Endarterectomy with Patch Angioplasty;  Surgeon: Rosetta Posner, MD;  Location: [facility] OR;  Service: Vascular;  Laterality: Left;   EYE SURGERY     bilateral cataracts   Fatty Tumor Excision     JOINT REPLACEMENT     TONSILLECTOMY     TONSILLECTOMY AND ADENOIDECTOMY  age 39   TOTAL KNEE ARTHROPLASTY Right 10/04/2015   TOTAL KNEE ARTHROPLASTY Right 10/04/2015   Procedure: TOTAL KNEE ARTHROPLASTY;  Surgeon: Dorna Leitz, MD;  Location: [place];  Service: Orthopedics;  Laterality: Right;   TOTAL KNEE ARTHROPLASTY Left 01/28/2016   Procedure: TOTAL KNEE ARTHROPLASTY;  Surgeon: Dorna Leitz, MD;  Location: [place];  Service: Orthopedics;  Laterality: Left;   TUBAL LIGATION     Family History Family History  Problem Relation Age of Onset   Cancer Mother        liver   Breast cancer Mother 81   Pancreatitis Father        deceased 33   Cancer Brother        GI   Coronary artery disease Paternal 66    Cancer Son        terminal kidney   Breast cancer Maternal Aunt    Cancer Other    Coronary artery disease Paternal Uncle     Social History Social History   Tobacco Use   Smoking status: Never   Smokeless tobacco: Never  Vaping Use   Vaping Use: Never  used  Substance Use Topics  Alcohol use: Yes    Alcohol/week: 1.0 standard drink of alcohol    Types: 1 Standard drinks or equivalent per week    Comment: maybe once per month - 3 drinks   Drug use: No   Allergies Ace inhibitors, Alendronate sodium, Aspirin, Crestor [rosuvastatin], Lipitor [atorvastatin calcium], Metformin and related, and Shellfish allergy  Review of Systems Review of Systems  All other systems reviewed and are negative.   Physical Exam Vital Signs  I have reviewed the triage vital signs BP (!) 144/64   Resp 18   Ht _0  (1.549 m)   Wt 75.8 kg   LMP  (LMP Unknown)   SpO2 98%   BMI 31.55 kg/m   Physical Exam Vitals and nursing note reviewed.  Constitutional:      General: She is not in acute distress.    Appearance: She is well-developed.  HENT:     Head: Normocephalic and atraumatic.  Eyes:     Conjunctiva/sclera: Conjunctivae normal.  Cardiovascular:     Rate and Rhythm: Normal rate and regular rhythm.     Heart sounds: No murmur heard. Pulmonary:     Effort: Pulmonary effort is normal. No respiratory distress.     Breath sounds: Normal breath sounds.  Abdominal:     Palpations: Abdomen is soft.     Tenderness: There is no abdominal tenderness.  Musculoskeletal:        General: No swelling.     Cervical back: Neck supple.  Skin:    General: Skin is warm and dry.     Capillary Refill: Capillary refill takes less than 2 seconds.  Neurological:     Mental Status: She is alert.  Psychiatric:        Mood and Affect: Mood normal.     ED Results and Treatments Labs (all labs ordered are listed, but only abnormal results are displayed) Labs Reviewed  COMPREHENSIVE METABOLIC PANEL  CBC WITH DIFFERENTIAL/PLATELET  URINALYSIS, ROUTINE W REFLEX MICROSCOPIC                                                                                                                          Radiology No results found.  Pertinent labs & imaging results  that were available during my care of the patient were reviewed by me and considered in my medical decision making (see MDM for details).  Medications Ordered in ED Medications - No data to display  Procedures .Critical Care  Performed by: Teressa Lower, MD Authorized by: Teressa Lower, MD   Critical care provider statement:    Critical care time (minutes):  30   Critical care was necessary to treat or prevent imminent or life-threatening deterioration of the following conditions:  Trauma   Critical care was time spent personally by me on the following activities:  Development of treatment plan with patient or surrogate, discussions with consultants, evaluation of patient's response to treatment, examination of patient, ordering and review of laboratory studies, ordering and review of radiographic studies, ordering and performing treatments and interventions, pulse oximetry, re-evaluation of patient's condition and review of old charts   (including critical care time)  Medical Decision Making / ED Course   This patient presents to the ED for concern of fall, this involves an extensive number of treatment options, and is a complaint that carries with it a high risk of complications and morbidity.  The differential diagnosis includes intracranial hemorrhage, fracture, contusion, intra-abdominal injury, UTI, electrolyte abnormality  MDM: Patient seen emergency room for evaluation of a fall on blood thinners.  Patient arrives a level 2 trauma and initial primary survey unremarkable.  Secondary survey also unremarkable.  Laboratory evaluation with a new transaminitis with AST 175, ALT 110 and given patient's underlying dementia, expanded trauma imaging was performed including a CT abdomen pelvis that incidentally shows a large DVT extending from the common femoral  vein to the left iliac vein.  Trauma imaging otherwise unremarkable.  I spoke with the vascular surgeon on-call Dr. Carlis Abbott who states that for young functional patients that would be able to tolerate an awake procedure, this kind of clot normally would require thrombectomy, but given the patient's age, underlying dementia and inability to tolerate this procedure, he is recommending anticoagulation at this time.  Heparin drip started and patient require hospital admission for transition to [place] versus IVC filter and likely shared decision-making discussion on risk and benefits of long-term anticoagulation in the setting of her recent fall today..  Additional history obtained: -Additional history obtained from daughter -External records from outside source obtained and reviewed including: Chart review including previous notes, labs, imaging, consultation notes   Lab Tests: -I ordered, reviewed, and interpreted labs.   The pertinent results include:   Labs Reviewed  COMPREHENSIVE METABOLIC PANEL  CBC WITH DIFFERENTIAL/PLATELET  URINALYSIS, ROUTINE W REFLEX MICROSCOPIC      Imaging Studies ordered: I ordered imaging studies including CT head, C-spine, chest x-ray, pelvis x-ray, CT abdomen pelvis I independently visualized and interpreted imaging. I agree with the radiologist interpretation   Medicines ordered and prescription drug management: No orders of the defined types were placed in this encounter.   -I have reviewed the patients home medicines and have made adjustments as needed  Critical interventions Trauma activation and evaluation, heparin  Consultations Obtained: I requested consultation with the vascular surgeon Dr. Carlis Abbott,  and discussed lab and imaging findings as well as pertinent plan - they recommend: Anticoagulation   Cardiac Monitoring: The patient was maintained on a cardiac monitor.  I personally viewed and interpreted the cardiac monitored which showed an  underlying rhythm of: NSR  Social Determinants of Health:  Factors impacting patients care include: Currently in a skilled nursing facility   Reevaluation: After the interventions noted above, I reevaluated the patient and found that they have :improved  Co morbidities that complicate the patient evaluation  Past Medical History:  Diagnosis Date   Arthritis    Carotid stenosis 11/2018  Cataract    surgery to remove   Diabetes mellitus    type 2   Hyperlipidemia    Hypertension    Skin cancer    Removed from face   Stroke [facility]) 09/19/2018   Urgency of urination    Urinary leakage       Dispostion: I considered admission for this patient, and due to new incidental large DVT requiring heparin drip, patient require hospital admission     Final Clinical Impression(s) / ED Diagnoses Final diagnoses:  None     _0 @    Teressa Lower, MD 11/30/21 2318

## 2021-11-30 NOTE — ED Notes (Signed)
[place] daughter given update.

## 2021-11-30 NOTE — Progress Notes (Signed)
Orthopedic Tech Progress Note Patient Details:  Wanda [name] 1940/06/04 [id number] Level 2 Trauma  Patient ID: [name], female   DOB: 09-02-1940, 81 y.o.   MRN: [id number]  Jearld Lesch 11/30/2021, 7:09 PM

## 2021-11-30 NOTE — ED Notes (Signed)
Back from CT

## 2021-12-01 ENCOUNTER — Encounter (HOSPITAL_COMMUNITY): Payer: Medicare Other

## 2021-12-01 DIAGNOSIS — I82409 Acute embolism and thrombosis of unspecified deep veins of unspecified lower extremity: Secondary | ICD-10-CM | POA: Diagnosis present

## 2021-12-01 DIAGNOSIS — E042 Nontoxic multinodular goiter: Secondary | ICD-10-CM

## 2021-12-01 DIAGNOSIS — R410 Disorientation, unspecified: Secondary | ICD-10-CM | POA: Diagnosis not present

## 2021-12-01 DIAGNOSIS — F0393 Unspecified dementia, unspecified severity, with mood disturbance: Secondary | ICD-10-CM | POA: Diagnosis present

## 2021-12-01 DIAGNOSIS — Z888 Allergy status to other drugs, medicaments and biological substances status: Secondary | ICD-10-CM | POA: Diagnosis not present

## 2021-12-01 DIAGNOSIS — R748 Abnormal levels of other serum enzymes: Secondary | ICD-10-CM | POA: Insufficient documentation

## 2021-12-01 DIAGNOSIS — Z79899 Other long term (current) drug therapy: Secondary | ICD-10-CM | POA: Diagnosis not present

## 2021-12-01 DIAGNOSIS — Z6831 Body mass index (BMI) 31.0-31.9, adult: Secondary | ICD-10-CM | POA: Diagnosis not present

## 2021-12-01 DIAGNOSIS — Z794 Long term (current) use of insulin: Secondary | ICD-10-CM | POA: Diagnosis not present

## 2021-12-01 DIAGNOSIS — E669 Obesity, unspecified: Secondary | ICD-10-CM | POA: Diagnosis present

## 2021-12-01 DIAGNOSIS — I824Y2 Acute embolism and thrombosis of unspecified deep veins of left proximal lower extremity: Secondary | ICD-10-CM | POA: Diagnosis not present

## 2021-12-01 DIAGNOSIS — I10 Essential (primary) hypertension: Secondary | ICD-10-CM

## 2021-12-01 DIAGNOSIS — Z8673 Personal history of transient ischemic attack (TIA), and cerebral infarction without residual deficits: Secondary | ICD-10-CM

## 2021-12-01 DIAGNOSIS — F39 Unspecified mood [affective] disorder: Secondary | ICD-10-CM | POA: Insufficient documentation

## 2021-12-01 DIAGNOSIS — Z91013 Allergy to seafood: Secondary | ICD-10-CM | POA: Diagnosis not present

## 2021-12-01 DIAGNOSIS — Z7401 Bed confinement status: Secondary | ICD-10-CM | POA: Diagnosis not present

## 2021-12-01 DIAGNOSIS — Z803 Family history of malignant neoplasm of breast: Secondary | ICD-10-CM | POA: Diagnosis not present

## 2021-12-01 DIAGNOSIS — E119 Type 2 diabetes mellitus without complications: Secondary | ICD-10-CM

## 2021-12-01 DIAGNOSIS — E1165 Type 2 diabetes mellitus with hyperglycemia: Secondary | ICD-10-CM | POA: Diagnosis present

## 2021-12-01 DIAGNOSIS — E785 Hyperlipidemia, unspecified: Secondary | ICD-10-CM | POA: Diagnosis present

## 2021-12-01 DIAGNOSIS — R4182 Altered mental status, unspecified: Secondary | ICD-10-CM | POA: Diagnosis not present

## 2021-12-01 DIAGNOSIS — I82412 Acute embolism and thrombosis of left femoral vein: Secondary | ICD-10-CM | POA: Diagnosis present

## 2021-12-01 DIAGNOSIS — Z85828 Personal history of other malignant neoplasm of skin: Secondary | ICD-10-CM | POA: Diagnosis not present

## 2021-12-01 DIAGNOSIS — I82890 Acute embolism and thrombosis of other specified veins: Secondary | ICD-10-CM | POA: Diagnosis not present

## 2021-12-01 DIAGNOSIS — Z886 Allergy status to analgesic agent status: Secondary | ICD-10-CM | POA: Diagnosis not present

## 2021-12-01 DIAGNOSIS — F05 Delirium due to known physiological condition: Secondary | ICD-10-CM | POA: Diagnosis present

## 2021-12-01 DIAGNOSIS — Z7902 Long term (current) use of antithrombotics/antiplatelets: Secondary | ICD-10-CM | POA: Diagnosis not present

## 2021-12-01 DIAGNOSIS — K862 Cyst of pancreas: Secondary | ICD-10-CM | POA: Diagnosis present

## 2021-12-01 DIAGNOSIS — Z96653 Presence of artificial knee joint, bilateral: Secondary | ICD-10-CM | POA: Diagnosis present

## 2021-12-01 DIAGNOSIS — Z8249 Family history of ischemic heart disease and other diseases of the circulatory system: Secondary | ICD-10-CM | POA: Diagnosis not present

## 2021-12-01 DIAGNOSIS — I82432 Acute embolism and thrombosis of left popliteal vein: Secondary | ICD-10-CM | POA: Diagnosis not present

## 2021-12-01 DIAGNOSIS — Z7984 Long term (current) use of oral hypoglycemic drugs: Secondary | ICD-10-CM | POA: Diagnosis not present

## 2021-12-01 LAB — HEPATITIS PANEL, ACUTE
HCV Ab: NONREACTIVE
Hep A IgM: NONREACTIVE
Hep B C IgM: NONREACTIVE
Hepatitis B Surface Ag: NONREACTIVE

## 2021-12-01 LAB — CBG MONITORING, ED
Glucose-Capillary: 303 mg/dL — ABNORMAL HIGH (ref 70–99)
Glucose-Capillary: 132 mg/dL — ABNORMAL HIGH (ref 70–99)
Glucose-Capillary: 130 mg/dL — ABNORMAL HIGH (ref 70–99)

## 2021-12-01 LAB — COMPREHENSIVE METABOLIC PANEL
ALT: 71 U/L — ABNORMAL HIGH (ref 0–44)
AST: 54 U/L — ABNORMAL HIGH (ref 15–41)
Albumin: 3.4 g/dL — ABNORMAL LOW (ref 3.5–5.0)
Alkaline Phosphatase: 80 U/L (ref 38–126)
Anion gap: 10 (ref 5–15)
BUN: 6 mg/dL — ABNORMAL LOW (ref 8–23)
CO2: 23 mmol/L (ref 22–32)
Calcium: 9.2 mg/dL (ref 8.9–10.3)
Chloride: 105 mmol/L (ref 98–111)
Creatinine, Ser: 0.71 mg/dL (ref 0.44–1.00)
GFR, Estimated: >60 mL/min (ref >60)
Glucose, Bld: 303 mg/dL — ABNORMAL HIGH (ref 70–99)
Potassium: 4.1 mmol/L (ref 3.5–5.1)
Sodium: 138 mmol/L (ref 135–145)
Total Bilirubin: 1.2 mg/dL (ref 0.3–1.2)
Total Protein: 6.3 g/dL — ABNORMAL LOW (ref 6.5–8.1)

## 2021-12-01 LAB — GLUCOSE, CAPILLARY
Glucose-Capillary: 120 mg/dL — ABNORMAL HIGH (ref 70–99)
Glucose-Capillary: 185 mg/dL — ABNORMAL HIGH (ref 70–99)

## 2021-12-01 LAB — CBC
HCT: 39.5 % (ref 36.0–46.0)
Hemoglobin: 13.6 g/dL (ref 12.0–15.0)
MCH: 30.5 pg (ref 26.0–34.0)
MCHC: 34.4 g/dL (ref 30.0–36.0)
MCV: 88.6 fL (ref 80.0–100.0)
Platelets: 225 10*3/uL (ref 150–400)
RBC: 4.46 MIL/uL (ref 3.87–5.11)
RDW: 13 % (ref 11.5–15.5)
WBC: 6.9 10*3/uL (ref 4.0–10.5)
nRBC: 0 % (ref 0.0–0.2)

## 2021-12-01 LAB — TSH: TSH: 1.041 u[IU]/mL (ref 0.350–4.500)

## 2021-12-01 LAB — T4, FREE: Free T4: 1.51 ng/dL — ABNORMAL HIGH (ref 0.61–1.12)

## 2021-12-01 LAB — HEPARIN LEVEL (UNFRACTIONATED)
Heparin Unfractionated: 0.64 IU/mL (ref 0.30–0.70)
Heparin Unfractionated: 0.38 IU/mL (ref 0.30–0.70)

## 2021-12-01 MED ORDER — APIXABAN 5 MG PO TABS: 5.0000 mg | ORAL_TABLET | Freq: Two times a day (BID) | ORAL | Status: DC

## 2021-12-01 MED ORDER — CLOPIDOGREL BISULFATE 75 MG PO TABS: 75.0000 mg | ORAL_TABLET | Freq: Every day | ORAL | Status: DC

## 2021-12-01 MED ORDER — INSULIN ASPART 100 UNIT/ML IJ SOLN
0.0000 [IU] | Freq: Three times a day (TID) | INTRAMUSCULAR | Status: DC
  Administered 2021-12-01: 2 [IU] via SUBCUTANEOUS
  Administered 2021-12-01: 11 [IU] via SUBCUTANEOUS
  Administered 2021-12-02: 5 [IU] via SUBCUTANEOUS
  Administered 2021-12-02: 8 [IU] via SUBCUTANEOUS

## 2021-12-01 MED ORDER — CLONIDINE HCL 0.1 MG PO TABS
0.1000 mg | ORAL_TABLET | Freq: Two times a day (BID) | ORAL | Status: DC
  Administered 2021-12-01 – 2021-12-02 (×3): 0.1 mg via ORAL
  Filled 2021-12-01 (×3): qty 1

## 2021-12-01 MED ORDER — AMLODIPINE BESYLATE 5 MG PO TABS
5.0000 mg | ORAL_TABLET | Freq: Every day | ORAL | Status: DC
  Administered 2021-12-01 – 2021-12-02 (×2): 5 mg via ORAL
  Filled 2021-12-01 (×2): qty 1

## 2021-12-01 MED ORDER — HALOPERIDOL LACTATE 5 MG/ML IJ SOLN
2.0000 mg | Freq: Four times a day (QID) | INTRAMUSCULAR | Status: DC | PRN
  Administered 2021-12-01: 2 mg via INTRAVENOUS
  Filled 2021-12-01: qty 1

## 2021-12-01 MED ORDER — INSULIN GLARGINE-YFGN 100 UNIT/ML ~~LOC~~ SOLN
15.0000 [IU] | Freq: Every day | SUBCUTANEOUS | Status: DC
  Administered 2021-12-02: 15 [IU] via SUBCUTANEOUS
  Filled 2021-12-01 (×2): qty 0.15

## 2021-12-01 MED ORDER — APIXABAN 5 MG PO TABS
10.0000 mg | ORAL_TABLET | Freq: Two times a day (BID) | ORAL | Status: DC
  Administered 2021-12-01 – 2021-12-02 (×3): 10 mg via ORAL
  Filled 2021-12-01 (×4): qty 2

## 2021-12-01 MED ORDER — VENLAFAXINE HCL ER 75 MG PO CP24
75.0000 mg | ORAL_CAPSULE | Freq: Every day | ORAL | Status: DC
  Administered 2021-12-01 – 2021-12-02 (×2): 75 mg via ORAL
  Filled 2021-12-01 (×3): qty 1

## 2021-12-01 MED ORDER — INSULIN ASPART 100 UNIT/ML IJ SOLN: 0.0000 [IU] | Freq: Every day | INTRAMUSCULAR | Status: DC

## 2021-12-01 MED ORDER — LORAZEPAM 2 MG/ML IJ SOLN: 1.0000 mg | Freq: Once | INTRAMUSCULAR | Status: DC

## 2021-12-01 NOTE — Progress Notes (Signed)
ANTICOAGULATION CONSULT NOTE - Initial Consult  Pharmacy Consult for heparin > eliquis Indication: DVT  Allergies  Allergen Reactions   Ace Inhibitors Diarrhea, Swelling, Other (See Comments) and Cough    Pt had cough and diarrhea with first few doses of medication; also had swelling of right eyelid. Stopped medication on 03/17/11.   Alendronate Sodium Other (See Comments)    "Made my whole body hurt"   Aspirin Hives   Crestor [Rosuvastatin] Other (See Comments)    "Made my whole body hurt"   Lipitor [Atorvastatin Calcium] Other (See Comments)    "Made my whole body hurt"   Metformin And Related     Severe abdominal pain   Shellfish Allergy Nausea Only    Intolerant of fresh shellfish, reports the reaction is GI upset, denies hives, denies any swelling  Reports that she can tolerate canned seafood.    Statins Other (See Comments)    myalgia    Patient Measurements: Height: '5\' 1"'$  (154.9 cm) Weight: 75.8 kg (167 lb) IBW/kg (Calculated) : 47.8  Vital Signs: Temp: 98 F (36.7 C) (11/23 0530) Temp Source: Oral (11/23 0530) BP: 164/70 (11/23 0830) Pulse Rate: 87 (11/23 0830)  Labs: Recent Labs    11/30/21 1906 12/01/21 0349 12/01/21 0653  HGB 13.5 13.6  --   HCT 39.9 39.5  --   PLT 236 225  --   HEPARINUNFRC  --  0.64  --   CREATININE 0.99  --  0.71    Estimated Creatinine Clearance: 51.4 mL/min (by C-G formula based on SCr of 0.71 mg/dL).   Medical History: Past Medical History:  Diagnosis Date   Arthritis    Carotid stenosis 11/2018   Cataract    surgery to remove   Diabetes mellitus    type 2   Hyperlipidemia    Hypertension    Skin cancer    Removed from face   Stroke [facility]) 09/19/2018   Urgency of urination    Urinary leakage     Assessment: 81 year old female presenting for mechanical fall. History of CVA in 07/2021, no AC PTA.  CT scan shows DVT in left leg. Pharmacy has been consulted to dose heparin.   Goal of Therapy:  Monitor platelets by  anticoagulation protocol: Yes   Plan:  Stop heparin infusion at time of first eliquis dose Eliquis '10mg'$  BID x7 days followed by eliquis '5mg'$  bid thereafter F/u s/sx bleeding, CBC PRN  Wilson Singer, PharmD Clinical Pharmacist 12/01/2021 10:43 AM

## 2021-12-01 NOTE — ED Notes (Signed)
Pt pulling monitor cords from wall and all cardiac leads off. Pt continues to be aggressive towards staff MD made aware

## 2021-12-01 NOTE — ED Notes (Signed)
ED TO INPATIENT HANDOFF REPORT  ED Nurse Name and Phone #: Luetta Nutting [phone]  S Name/Age/Gender [name] 81 y.o. female Room/Bed: 009C/009C  Code Status   Code Status: Full Code  Home/SNF/Other Nursing Home Patient oriented to: self Is this baseline? Yes   Triage Complete: Triage complete  Chief Complaint Acute DVT (deep venous thrombosis) (HCC) [I82.409]  Triage Note No notes on file   Allergies Allergies  Allergen Reactions   Ace Inhibitors Diarrhea, Swelling, Other (See Comments) and Cough    Pt had cough and diarrhea with first few doses of medication; also had swelling of right eyelid. Stopped medication on 03/17/11.   Alendronate Sodium Other (See Comments)    "Made my whole body hurt"   Aspirin Hives   Crestor [Rosuvastatin] Other (See Comments)    "Made my whole body hurt"   Lipitor [Atorvastatin Calcium] Other (See Comments)    "Made my whole body hurt"   Metformin And Related     Severe abdominal pain   Shellfish Allergy Nausea Only    Intolerant of fresh shellfish, reports the reaction is GI upset, denies hives, denies any swelling  Reports that she can tolerate canned seafood.    Statins Other (See Comments)    myalgia    Level of Care/Admitting Diagnosis ED Disposition     ED Disposition  Admit   Condition  --   [place]: [place] [[id number]]  Level of Care: Telemetry Medical [[id number]]  May place patient in observation at [facility] or [place] if equivalent level of care is available:: Yes  Covid Evaluation: Asymptomatic - no recent exposure (last 10 days) testing not required  Diagnosis: Acute DVT (deep venous thrombosis) [facility]) [[id number]]  Admitting Physician: Shela Leff [[id number]]  Attending Physician: Shela Leff [[id number]]          B Medical/Surgery History Past Medical History:  Diagnosis Date   Arthritis    Carotid stenosis 11/2018   Cataract    surgery to remove   Diabetes  mellitus    type 2   Hyperlipidemia    Hypertension    Skin cancer    Removed from face   Stroke ([place]) 09/19/2018   Urgency of urination    Urinary leakage    Past Surgical History:  Procedure Laterality Date   ABDOMINAL HYSTERECTOMY  early 80's   total   CAROTID ENDARTERECTOMY Left 11/13/2018   CARPAL TUNNEL RELEASE Bilateral 1980 and 1981   both hands   ENDARTERECTOMY Left 11/13/2018   Procedure: Left Carotid Artery Endarterectomy with Patch Angioplasty;  Surgeon: Rosetta Posner, MD;  Location: [facility] OR;  Service: Vascular;  Laterality: Left;   EYE SURGERY     bilateral cataracts   Fatty Tumor Excision     JOINT REPLACEMENT     TONSILLECTOMY     TONSILLECTOMY AND ADENOIDECTOMY  age 21   TOTAL KNEE ARTHROPLASTY Right 10/04/2015   TOTAL KNEE ARTHROPLASTY Right 10/04/2015   Procedure: TOTAL KNEE ARTHROPLASTY;  Surgeon: Dorna Leitz, MD;  Location: [place];  Service: Orthopedics;  Laterality: Right;   TOTAL KNEE ARTHROPLASTY Left 01/28/2016   Procedure: TOTAL KNEE ARTHROPLASTY;  Surgeon: Dorna Leitz, MD;  Location: [place];  Service: Orthopedics;  Laterality: Left;   TUBAL LIGATION       A IV Location/Drains/Wounds Patient Lines/Drains/Airways Status     Active Line/Drains/Airways     Name Placement date Placement time Site Days   External Urinary Catheter 07/24/21  1500  --  130   Incision (Closed) 11/13/18 Neck Left 11/13/18  1043  -- 1114            Intake/Output Last 24 hours  Intake/Output Summary (Last 24 hours) at 12/01/2021 1222 Last data filed at 12/01/2021 0644 Gross per 24 hour  Intake --  Output 1000 ml  Net -1000 ml    Labs/Imaging Results for orders placed or performed during the hospital encounter of 11/30/21 (from the past 48 hour(s))  Comprehensive metabolic panel     Status: Abnormal   Collection Time: 11/30/21  7:06 PM  Result Value Ref Range   Sodium 134 (L) 135 - 145 mmol/L   Potassium 4.0 3.5 - 5.1 mmol/L   Chloride 100 98 - 111 mmol/L   CO2  21 (L) 22 - 32 mmol/L   Glucose, Bld 362 (H) 70 - 99 mg/dL    Comment: Glucose reference range applies only to samples taken after fasting for at least 8 hours.   BUN 9 8 - 23 mg/dL   Creatinine, Ser 0.99 0.44 - 1.00 mg/dL   Calcium 9.7 8.9 - 10.3 mg/dL   Total Protein 7.2 6.5 - 8.1 g/dL   Albumin 4.0 3.5 - 5.0 g/dL   AST 175 (H) 15 - 41 U/L   ALT 110 (H) 0 - 44 U/L   Alkaline Phosphatase 89 38 - 126 U/L   Total Bilirubin 1.0 0.3 - 1.2 mg/dL   GFR, Estimated 57 (L) >60 mL/min    Comment: (NOTE) Calculated using the CKD-EPI Creatinine Equation (2021)    Anion gap 13 5 - 15    Comment: Performed at [facility] [address], [place], [place] [address]  CBC with Differential     Status: None   Collection Time: 11/30/21  7:06 PM  Result Value Ref Range   WBC 7.4 4.0 - 10.5 K/uL   RBC 4.52 3.87 - 5.11 MIL/uL   Hemoglobin 13.5 12.0 - 15.0 g/dL   HCT 39.9 36.0 - 46.0 %   MCV 88.3 80.0 - 100.0 fL   MCH 29.9 26.0 - 34.0 pg   MCHC 33.8 30.0 - 36.0 g/dL   RDW 13.0 11.5 - 15.5 %   Platelets 236 150 - 400 K/uL   nRBC 0.0 0.0 - 0.2 %   Neutrophils Relative % 66 %   Neutro Abs 4.9 1.7 - 7.7 K/uL   Lymphocytes Relative 23 %   Lymphs Abs 1.7 0.7 - 4.0 K/uL   Monocytes Relative 10 %   Monocytes Absolute 0.7 0.1 - 1.0 K/uL   Eosinophils Relative 1 %   Eosinophils Absolute 0.0 0.0 - 0.5 K/uL   Basophils Relative 0 %   Basophils Absolute 0.0 0.0 - 0.1 K/uL   Immature Granulocytes 0 %   Abs Immature Granulocytes 0.03 0.00 - 0.07 K/uL    Comment: Performed at [facility], [address] [place], [place] [address]  Urinalysis, Routine w reflex microscopic Urine, Clean Catch     Status: Abnormal   Collection Time: 11/30/21  7:50 PM  Result Value Ref Range   Color, Urine STRAW (A) YELLOW   APPearance CLEAR CLEAR   Specific Gravity, Urine 1.003 (L) 1.005 - 1.030   pH 7.0 5.0 - 8.0   Glucose, UA >=500 (A) NEGATIVE mg/dL   Hgb urine dipstick NEGATIVE NEGATIVE   Bilirubin  Urine NEGATIVE NEGATIVE   Ketones, ur NEGATIVE NEGATIVE mg/dL   Protein, ur NEGATIVE NEGATIVE mg/dL   Nitrite NEGATIVE  NEGATIVE   Leukocytes,Ua NEGATIVE NEGATIVE   RBC / HPF 0-5 0 - 5 RBC/hpf   WBC, UA 0-5 0 - 5 WBC/hpf   Bacteria, UA RARE (A) NONE SEEN   Squamous Epithelial / LPF 0-5 0 - 5    Comment: Performed at [facility], [place] [address], [place] [address]  CBC     Status: None   Collection Time: 12/01/21  3:49 AM  Result Value Ref Range   WBC 6.9 4.0 - 10.5 K/uL   RBC 4.46 3.87 - 5.11 MIL/uL   Hemoglobin 13.6 12.0 - 15.0 g/dL   HCT 39.5 36.0 - 46.0 %   MCV 88.6 80.0 - 100.0 fL   MCH 30.5 26.0 - 34.0 pg   MCHC 34.4 30.0 - 36.0 g/dL   RDW 13.0 11.5 - 15.5 %   Platelets 225 150 - 400 K/uL   nRBC 0.0 0.0 - 0.2 %    Comment: Performed at [facility], [place] [address] [place], [place] [address]  Heparin level (unfractionated)     Status: None   Collection Time: 12/01/21  3:49 AM  Result Value Ref Range   Heparin Unfractionated 0.64 0.30 - 0.70 IU/mL    Comment: (NOTE) The clinical reportable range upper limit is being lowered to >1.10 to align with the FDA approved guidance for the current laboratory assay.  If heparin results are below expected values, and patient dosage has  been confirmed, suggest follow up testing of antithrombin III levels. Performed at [facility], [place] [address], [place], [place] [address]   Comprehensive metabolic panel     Status: Abnormal   Collection Time: 12/01/21  6:53 AM  Result Value Ref Range   Sodium 138 135 - 145 mmol/L   Potassium 4.1 3.5 - 5.1 mmol/L   Chloride 105 98 - 111 mmol/L   CO2 23 22 - 32 mmol/L   Glucose, Bld 303 (H) 70 - 99 mg/dL    Comment: Glucose reference range applies only to samples taken after fasting for at least 8 hours.   BUN 6 (L) 8 - 23 mg/dL   Creatinine, Ser 0.71 0.44 - 1.00 mg/dL   Calcium 9.2 8.9 - 10.3 mg/dL   Total Protein 6.3 (L) 6.5 - 8.1 g/dL   Albumin 3.4 (L) 3.5 - 5.0  g/dL   AST 54 (H) 15 - 41 U/L   ALT 71 (H) 0 - 44 U/L   Alkaline Phosphatase 80 38 - 126 U/L   Total Bilirubin 1.2 0.3 - 1.2 mg/dL   GFR, Estimated >60 >60 mL/min    Comment: (NOTE) Calculated using the CKD-EPI Creatinine Equation (2021)    Anion gap 10 5 - 15    Comment: Performed at [facility], [place] [address] [place], [place] [address]  Hepatitis panel, acute     Status: None   Collection Time: 12/01/21  6:53 AM  Result Value Ref Range   Hepatitis B Surface Ag NON REACTIVE NON REACTIVE   HCV Ab NON REACTIVE NON REACTIVE    Comment: (NOTE) Nonreactive HCV antibody screen is consistent with no HCV infections,  unless recent infection is suspected or other evidence exists to indicate HCV infection.     Hep A IgM NON REACTIVE NON REACTIVE   Hep B C IgM NON REACTIVE NON REACTIVE    Comment: Performed at [facility], [place] [address], [place], [place] [address]  TSH  Status: None   Collection Time: 12/01/21  6:53 AM  Result Value Ref Range   TSH 1.041 0.350 - 4.500 uIU/mL    Comment: Performed by a 3rd Generation assay with a functional sensitivity of <=0.01 uIU/mL. Performed at [facility], [place] [address], [place], [place] [address]   T4, free     Status: Abnormal   Collection Time: 12/01/21  6:53 AM  Result Value Ref Range   Free T4 1.51 (H) 0.61 - 1.12 ng/dL    Comment: (NOTE) Biotin ingestion may interfere with free T4 tests. If the results are inconsistent with the TSH level, previous test results, or the clinical presentation, then consider biotin interference. If needed, order repeat testing after stopping biotin. Performed at [facility], [place] [address], [place], [place] [address]   CBG monitoring, ED     Status: Abnormal   Collection Time: 12/01/21  8:24 AM  Result Value Ref Range   Glucose-Capillary 303 (H) 70 - 99 mg/dL    Comment: Glucose reference range applies only to samples taken after fasting for at least 8 hours.   Comment  1 Notify RN    Comment 2 Document in Chart   Heparin level (unfractionated)     Status: None   Collection Time: 12/01/21 11:09 AM  Result Value Ref Range   Heparin Unfractionated 0.38 0.30 - 0.70 IU/mL    Comment: (NOTE) The clinical reportable range upper limit is being lowered to >1.10 to align with the FDA approved guidance for the current laboratory assay.  If heparin results are below expected values, and patient dosage has  been confirmed, suggest follow up testing of antithrombin III levels. Performed at [facility], [place] [address], [place], [place] [address]    CT ABDOMEN PELVIS W CONTRAST  Result Date: 11/30/2021 CLINICAL DATA:  Blunt abdominal trauma. EXAM: CT ABDOMEN AND PELVIS WITH CONTRAST TECHNIQUE: Multidetector CT imaging of the abdomen and pelvis was performed using the standard protocol following bolus administration of intravenous contrast. RADIATION DOSE REDUCTION: This exam was performed according to the departmental dose-optimization program which includes automated exposure control, adjustment of the mA and/or kV according to patient size and/or use of iterative reconstruction technique. CONTRAST:  73m OMNIPAQUE IOHEXOL 350 MG/ML SOLN COMPARISON:  None Available. FINDINGS: Lower chest: The visualized lung bases are clear. There is 3 vessel coronary vascular calcification. There is calcification of the mitral annulus. No intra-abdominal free air or free fluid. Hepatobiliary: The liver is unremarkable. No biliary dilatation. The gallbladder is unremarkable. Pancreas: 2 small cystic lesions in the body of the pancreas measuring up to 15 mm are not characterized on this CT but likely represents a side branch IPMN. Further characterization with MRI without and with contrast on a nonemergent/outpatient basis recommended. No dilatation of the main pancreatic duct or gland atrophy. Spleen: Normal in size without focal abnormality. Adrenals/Urinary Tract: The adrenal glands  are unremarkable. Small left renal interpolar cyst. No follow-up by imaging. There is no hydronephrosis on either side. There is symmetric enhancement and excretion of contrast by both kidneys. The visualized ureters and urinary bladder appear unremarkable. Stomach/Bowel: There is no bowel obstruction or active inflammation. The appendix is normal. Vascular/Lymphatic: Moderate aortoiliac atherosclerotic disease. There is DVT extending from the visualized left common femoral vein into the left common iliac vein. Faint hypodensity in the right common femoral vein may be artifactual and related to mixing of contrast or represent additional DVT. The IVC is unremarkable. There  is mild compression of the left common iliac vein between the spine and right common iliac artery. No portal venous gas. There is no adenopathy. Reproductive: Hysterectomy.  No adnexal masses. Other: None Musculoskeletal: Osteopenia with degenerative changes of the spine. Grade 1 L4-L5 anterolisthesis. T11 hemangioma. No acute osseous pathology. IMPRESSION: 1. No acute intra-abdominal or pelvic pathology. 2. DVT extending from the visualized left common femoral vein into the left common iliac vein. 3. Small cystic lesions in the body of the pancreas as above, likely represents a side branch IPMN. Further characterization with MRI without and with contrast on a nonemergent/outpatient basis recommended. 4.  Aortic Atherosclerosis (ICD10-I70.0). These results were called by telephone at the time of interpretation on 11/30/2021 at 9:12 pm to provider [facility] , who verbally acknowledged these results. Electronically Signed   By: Anner Crete M.D.   On: 11/30/2021 21:13   DG Pelvis Portable  Result Date: 11/30/2021 CLINICAL DATA:  Level 2 trauma due to a fall. EXAM: PORTABLE PELVIS 1-2 VIEWS COMPARISON:  None Available. FINDINGS: Degenerative changes in the lower lumbar spine and hips. Pelvis appears intact. No acute displaced fractures  identified. SI joints and symphysis pubis are nondisplaced. Visualized sacrum appears intact. IMPRESSION: No acute bony abnormalities. Electronically Signed   By: Lucienne Capers M.D.   On: 11/30/2021 19:43   DG Chest Portable 1 View  Result Date: 11/30/2021 CLINICAL DATA:  Level 2 trauma due to a fall. EXAM: PORTABLE CHEST 1 VIEW COMPARISON:  07/25/2021 FINDINGS: Shallow inspiration. Heart size and pulmonary vascularity are normal. Lungs are clear. No pleural effusions. No pneumothorax. Calcified aorta. Degenerative changes in the spine and shoulders. IMPRESSION: Shallow inspiration.  No evidence of active pulmonary disease. Electronically Signed   By: Lucienne Capers M.D.   On: 11/30/2021 19:43   CT Cervical Spine Wo Contrast  Result Date: 11/30/2021 CLINICAL DATA:  Neck trauma (Age >= 65y).  Fall. EXAM: CT CERVICAL SPINE WITHOUT CONTRAST TECHNIQUE: Multidetector CT imaging of the cervical spine was performed without intravenous contrast. Multiplanar CT image reconstructions were also generated. RADIATION DOSE REDUCTION: This exam was performed according to the departmental dose-optimization program which includes automated exposure control, adjustment of the mA and/or kV according to patient size and/or use of iterative reconstruction technique. COMPARISON:  None Available. FINDINGS: Alignment: Normal Skull base and vertebrae: No acute fracture. No primary bone lesion or focal pathologic process. Soft tissues and spinal canal: No prevertebral fluid or swelling. No visible canal hematoma. Disc levels: Diffuse moderate degenerative disc disease and facet disease. No disc herniation. Upper chest: No acute findings Other: Bilateral thyroid nodules, the largest in the right thyroid lobe appears to be solid and measures 3 cm. IMPRESSION: Degenerative disc and facet disease.  No acute bony abnormality. Multiple bilateral thyroid nodules, the largest 3 cm in the right thyroid lobe. Recommend non emergent  thyroid US (ref: J Am Coll Radiol. 2015 Feb;12(2): 143-50). Electronically Signed   By: Rolm Baptise M.D.   On: 11/30/2021 19:37   CT HEAD WO CONTRAST (5MM)  Result Date: 11/30/2021 CLINICAL DATA:  Head trauma, minor (Age >= 65y).  Fall EXAM: CT HEAD WITHOUT CONTRAST TECHNIQUE: Contiguous axial images were obtained from the base of the skull through the vertex without intravenous contrast. RADIATION DOSE REDUCTION: This exam was performed according to the departmental dose-optimization program which includes automated exposure control, adjustment of the mA and/or kV according to patient size and/or use of iterative reconstruction technique. COMPARISON:  07/24/2021 FINDINGS: Brain: There is  atrophy and chronic small vessel disease changes. 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 Old frontal parietal infarct. No acute intracranial abnormality. Specifically, no hemorrhage, hydrocephalus, mass lesion, acute infarction, or significant intracranial injury. Vascular: No hyperdense vessel or unexpected calcification. Skull: No acute calvarial abnormality. 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 Atrophy, chronic microvascular disease. No acute intracranial abnormality. Electronically Signed   By: Rolm Baptise M.D.   On: 11/30/2021 19:34    Pending Labs Unresulted Labs (From admission, onward)     Start     Ordered   12/02/21 0500  Comprehensive metabolic panel  Tomorrow morning,   R        12/01/21 1035   12/02/21 0500  Magnesium  Tomorrow morning,   R        12/01/21 1035   12/01/21 0500  CBC  Daily at 5am,   R      11/30/21 2209   12/01/21 0500  Heparin level (unfractionated)  Daily at 5am,   R      11/30/21 2209            Vitals/Pain Today's Vitals   12/01/21 0630 12/01/21 0830 12/01/21 1110 12/01/21 1129  BP: (!) 178/74 (!) 164/70 (!) 153/80   Pulse: 78 87 90   Resp: '16  16 15   '$ Temp:    97.6 F (36.4 C)  TempSrc:    Oral  SpO2: 96% 98% 97%   Weight:      Height:      PainSc:        Isolation Precautions No active isolations  Medications Medications  heparin ADULT infusion 100 units/mL (25000 units/281m) (0 Units/hr Intravenous Stopped 12/01/21 1046)  insulin aspart (novoLOG) injection 0-15 Units (11 Units Subcutaneous Given 12/01/21 0829)  insulin aspart (novoLOG) injection 0-5 Units (has no administration in time range)  haloperidol lactate (HALDOL) injection 2 mg (2 mg Intravenous Given 12/01/21 0952)  amLODipine (NORVASC) tablet 5 mg (5 mg Oral Given 12/01/21 1126)  cloNIDine (CATAPRES) tablet 0.1 mg (0.1 mg Oral Given 12/01/21 1126)  insulin glargine-yfgn (SEMGLEE) injection 15 Units (has no administration in time range)  venlafaxine XR (EFFEXOR-XR) 24 hr capsule 75 mg (75 mg Oral Given 12/01/21 1127)  LORazepam (ATIVAN) injection 1 mg (0 mg Intravenous Hold 12/01/21 1112)  apixaban (ELIQUIS) tablet 10 mg (10 mg Oral Given 12/01/21 1125)    Followed by  apixaban (ELIQUIS) tablet 5 mg (has no administration in time range)  iohexol (OMNIPAQUE) 350 MG/ML injection 75 mL (75 mLs Intravenous Contrast Given 11/30/21 2024)  heparin bolus via infusion 3,900 Units (3,900 Units Intravenous Bolus from Bag 11/30/21 2328)    Mobility walks with device Moderate fall risk   Focused Assessments    R Recommendations: See Admitting Provider Note  Report given to:   Additional Notes:

## 2021-12-01 NOTE — H&P (Addendum)
History and Physical    Wanda [name] ZOX:[id number] DOB: [birth year] DOA: 11/30/2021  PCP: Darreld Mclean, MD  Patient coming from: SNF  Chief Complaint: Fall  HPI: Wanda [name] is a 81 y.o. female with medical history significant of insulin-dependent type 2 diabetes, hypertension, hyperlipidemia, multiple strokes, left carotid endarterectomy in 2020 presented to the ED from SNF for evaluation of a mechanical fall.  She is on Plavix.  CT head/C-spine and chest/pelvic x-rays negative for acute finding.  CT abdomen pelvis showing DVT extending from the left common femoral vein into the left common iliac vein.  CBC unremarkable.  CMP showing sodium 134, bicarb 21, anion gap 13, glucose 362, AST 175, ALT 110, alk phos 89, T. bili 1.0. Patient was started on IV heparin.  ED physician discussed the case with Dr. Carlis Abbott with vascular surgery who felt that this clot would require thrombectomy but patient would not be able to tolerate this procedure.  He recommended continuing anticoagulation at this time.  [facility] called to admit.  Patient states she was ironing clothes yesterday and fell.  Denies any injuries from the fall.  Denies dizziness, chest pain, or shortness of breath.  Denies history of blood clots.  States she stays in the bed most of the time.  Denies recent travel or surgeries.  No other complaints.  Review of Systems:  Review of Systems  All other systems reviewed and are negative.   Past Medical History:  Diagnosis Date   Arthritis    Carotid stenosis 11/2018   Cataract    surgery to remove   Diabetes mellitus    type 2   Hyperlipidemia    Hypertension    Skin cancer    Removed from face   Stroke [facility]) 09/19/2018   Urgency of urination    Urinary leakage     Past Surgical History:  Procedure Laterality Date   ABDOMINAL HYSTERECTOMY  early 80's   total   CAROTID ENDARTERECTOMY Left 11/13/2018   CARPAL TUNNEL RELEASE Bilateral 1980 and 1981   both hands    ENDARTERECTOMY Left 11/13/2018   Procedure: Left Carotid Artery Endarterectomy with Patch Angioplasty;  Surgeon: Rosetta Posner, MD;  Location: [facility] OR;  Service: Vascular;  Laterality: Left;   EYE SURGERY     bilateral cataracts   Fatty Tumor Excision     JOINT REPLACEMENT     TONSILLECTOMY     TONSILLECTOMY AND ADENOIDECTOMY  age 59   TOTAL KNEE ARTHROPLASTY Right 10/04/2015   TOTAL KNEE ARTHROPLASTY Right 10/04/2015   Procedure: TOTAL KNEE ARTHROPLASTY;  Surgeon: Dorna Leitz, MD;  Location: [place];  Service: Orthopedics;  Laterality: Right;   TOTAL KNEE ARTHROPLASTY Left 01/28/2016   Procedure: TOTAL KNEE ARTHROPLASTY;  Surgeon: Dorna Leitz, MD;  Location: [place];  Service: Orthopedics;  Laterality: Left;   TUBAL LIGATION       reports that she has never smoked. She has never used smokeless tobacco. She reports current alcohol use of about 1.0 standard drink of alcohol per week. She reports that she does not use drugs.  Allergies  Allergen Reactions   Ace Inhibitors Diarrhea, Swelling, Other (See Comments) and Cough    Pt had cough and diarrhea with first few doses of medication; also had swelling of right eyelid. Stopped medication on 03/17/11.   Alendronate Sodium Other (See Comments)    "Made my whole body hurt"   Aspirin Hives   Crestor [Rosuvastatin] Other (See Comments)    "Made my  whole body hurt"   Lipitor [Atorvastatin Calcium] Other (See Comments)    "Made my whole body hurt"   Metformin And Related     Severe abdominal pain   Shellfish Allergy Nausea Only    Intolerant of fresh shellfish, reports the reaction is GI upset, denies hives, denies any swelling  Reports that she can tolerate canned seafood.     Family History  Problem Relation Age of Onset   Cancer Mother        liver   Breast cancer Mother 58   Pancreatitis Father        deceased 64   Cancer Brother        GI   Coronary artery disease Paternal 11    Cancer Son        terminal kidney   Breast  cancer Maternal Aunt    Cancer Other    Coronary artery disease Paternal Uncle     Prior to Admission medications   Medication Sig Start Date End Date Taking? Authorizing Provider  amLODipine (NORVASC) 5 MG tablet TAKE 1 TABLET(5 MG) BY MOUTH DAILY Patient taking differently: Take 5 mg by mouth daily. 03/16/21   Copland, Gay Filler, MD  blood glucose meter kit and supplies Dispense based on patient and insurance preference. Pt just needs meter 02/26/17   Copland, Gay Filler, MD  Blood Glucose Monitoring Suppl (ONE TOUCH ULTRA MINI) w/Device KIT Use to test blood sugar daily as instructed. Dx: E11.65 01/26/21   Copland, Gay Filler, MD  clopidogrel (PLAVIX) 75 MG tablet Take 1 tablet (75 mg total) by mouth daily. 03/16/21   Copland, Gay Filler, MD  glipiZIDE (GLUCOTROL XL) 5 MG 24 hr tablet Take 2 tabs in the morning and 1 tab in the evening. Patient taking differently: Take 5-10 mg by mouth See admin instructions. Take 2 tabs in the morning and 1 tab in the evening. 03/16/21   Copland, Gay Filler, MD  glucose blood (ONE TOUCH ULTRA TEST) test strip Test blood sugar 3 times a day. Dx code: 250.00 03/16/21   Copland, Gay Filler, MD  glucose blood (ONETOUCH ULTRA) test strip Use as instructed 06/08/21   Copland, Gay Filler, MD  insulin aspart (NOVOLOG) 100 UNIT/ML injection Inject 3 Units into the skin 3 (three) times daily with meals. 07/29/21   Dahal, Marlowe Aschoff, MD  insulin aspart (NOVOLOG) 100 UNIT/ML injection Inject 0-5 Units into the skin at bedtime. 07/29/21   Dahal, Marlowe Aschoff, MD  insulin aspart (NOVOLOG) 100 UNIT/ML injection Inject 0-20 Units into the skin 3 (three) times daily with meals. 07/29/21   Dahal, Marlowe Aschoff, MD  insulin glargine-yfgn (SEMGLEE) 100 UNIT/ML injection Inject 0.3 mLs (30 Units total) into the skin daily. 07/30/21   Dahal, Marlowe Aschoff, MD  Insulin Pen Needle (BD PEN NEEDLE NANO U/F) 32G X 4 MM MISC USE TO INJECT INSULIN ONCE DAILY 08/31/16   Copland, Gay Filler, MD  Insulin Syringe-Needle U-100 (INSULIN  SYRINGE .5CC/31GX5/16") 31G X 5/16" 0.5 ML MISC Use to inject insulin 1 time daily. 09/23/14   Philemon Kingdom, MD  Lancets MISC 1 each by Does not apply route 3 (three) times daily. Dx: E11.65 01/26/21   Copland, Gay Filler, MD  senna-docusate (SENOKOT-S) 8.6-50 MG tablet Take 1 tablet by mouth at bedtime as needed for moderate constipation. 07/29/21   Terrilee Croak, MD  venlafaxine XR (EFFEXOR XR) 37.5 MG 24 hr capsule Take 1 capsule (37.5 mg total) by mouth daily with breakfast. Increase to 75 mg after 1 week 09/09/21  Copland, Gay Filler, MD  lisinopril (PRINIVIL,ZESTRIL) 20 MG tablet Take 20 mg by mouth daily.   03/23/11  [provider]    Physical Exam: Vitals:   12/01/21 0300 12/01/21 0330 12/01/21 0400 12/01/21 0500  BP: 137/69 (!) 166/76 (!) 167/78 130/64  Pulse: 79 82 86 81  Resp: _0 Temp:      TempSrc:      SpO2: 93% 95% 94% 93%  Weight:      Height:        Physical Exam Vitals reviewed.  Constitutional:      General: She is not in acute distress. HENT:     Head: Normocephalic and atraumatic.  Eyes:     Extraocular Movements: Extraocular movements intact.  Cardiovascular:     Rate and Rhythm: Normal rate and regular rhythm.     Pulses: Normal pulses.  Pulmonary:     Effort: Pulmonary effort is normal. No respiratory distress.     Breath sounds: Normal breath sounds. No wheezing or rales.  Abdominal:     General: Bowel sounds are normal. There is no distension.     Palpations: Abdomen is soft.     Tenderness: There is no abdominal tenderness.  Musculoskeletal:     Cervical back: Normal range of motion.     Right lower leg: No edema.     Left lower leg: No edema.  Skin:    General: Skin is warm and dry.  Neurological:     General: No focal deficit present.     Mental Status: She is alert and oriented to person, place, and time.     Cranial Nerves: No cranial nerve deficit.     Sensory: No sensory deficit.     Motor: No weakness.     Labs on  Admission: I have personally reviewed following labs and imaging studies  CBC: Recent Labs  Lab 11/30/21 1906 12/01/21 0349  WBC 7.4 6.9  NEUTROABS 4.9  --   HGB 13.5 13.6  HCT 39.9 39.5  MCV 88.3 88.6  PLT 236 119   Basic Metabolic Panel: Recent Labs  Lab 11/30/21 1906  NA 134*  K 4.0  CL 100  CO2 21*  GLUCOSE 362*  BUN 9  CREATININE 0.99  CALCIUM 9.7   GFR: Estimated Creatinine Clearance: 41.5 mL/min (by C-G formula based on SCr of 0.99 mg/dL). Liver Function Tests: Recent Labs  Lab 11/30/21 1906  AST 175*  ALT 110*  ALKPHOS 89  BILITOT 1.0  PROT 7.2  ALBUMIN 4.0   No results for input(s): "LIPASE", "AMYLASE" in the last 168 hours. No results for input(s): "AMMONIA" in the last 168 hours. Coagulation Profile: No results for input(s): "INR", "PROTIME" in the last 168 hours. Cardiac Enzymes: No results for input(s): "CKTOTAL", "CKMB", "CKMBINDEX", "TROPONINI" in the last 168 hours. BNP (last 3 results) No results for input(s): "PROBNP" in the last 8760 hours. HbA1C: No results for input(s): "HGBA1C" in the last 72 hours. CBG: No results for input(s): "GLUCAP" in the last 168 hours. Lipid Profile: No results for input(s): "CHOL", "HDL", "LDLCALC", "TRIG", "CHOLHDL", "LDLDIRECT" in the last 72 hours. Thyroid Function Tests: No results for input(s): "TSH", "T4TOTAL", "FREET4", "T3FREE", "THYROIDAB" in the last 72 hours. Anemia Panel: No results for input(s): "VITAMINB12", "FOLATE", "FERRITIN", "TIBC", "IRON", "RETICCTPCT" in the last 72 hours. Urine analysis:    Component Value Date/Time   COLORURINE STRAW (A) 11/30/2021 1950   APPEARANCEUR CLEAR 11/30/2021 1950   LABSPEC 1.003 (L)  11/30/2021 1950   PHURINE 7.0 11/30/2021 1950   GLUCOSEU >=500 (A) 11/30/2021 1950   HGBUR NEGATIVE 11/30/2021 1950   BILIRUBINUR NEGATIVE 11/30/2021 1950   BILIRUBINUR neg 07/18/2012 [place] 11/30/2021 1950   PROTEINUR NEGATIVE 11/30/2021 1950    UROBILINOGEN 0.2 07/18/2012 0938   NITRITE NEGATIVE 11/30/2021 1950   LEUKOCYTESUR NEGATIVE 11/30/2021 1950    Radiological Exams on Admission: CT ABDOMEN PELVIS W CONTRAST  Result Date: 11/30/2021 CLINICAL DATA:  Blunt abdominal trauma. EXAM: CT ABDOMEN AND PELVIS WITH CONTRAST TECHNIQUE: Multidetector CT imaging of the abdomen and pelvis was performed using the standard protocol following bolus administration of intravenous contrast. RADIATION DOSE REDUCTION: This exam was performed according to the departmental dose-optimization program which includes automated exposure control, adjustment of the mA and/or kV according to patient size and/or use of iterative reconstruction technique. CONTRAST:  100m OMNIPAQUE IOHEXOL 350 MG/ML SOLN COMPARISON:  None Available. FINDINGS: Lower chest: The visualized lung bases are clear. There is 3 vessel coronary vascular calcification. There is calcification of the mitral annulus. No intra-abdominal free air or free fluid. Hepatobiliary: The liver is unremarkable. No biliary dilatation. The gallbladder is unremarkable. Pancreas: 2 small cystic lesions in the body of the pancreas measuring up to 15 mm are not characterized on this CT but likely represents a side branch IPMN. Further characterization with MRI without and with contrast on a nonemergent/outpatient basis recommended. No dilatation of the main pancreatic duct or gland atrophy. Spleen: Normal in size without focal abnormality. Adrenals/Urinary Tract: The adrenal glands are unremarkable. Small left renal interpolar cyst. No follow-up by imaging. There is no hydronephrosis on either side. There is symmetric enhancement and excretion of contrast by both kidneys. The visualized ureters and urinary bladder appear unremarkable. Stomach/Bowel: There is no bowel obstruction or active inflammation. The appendix is normal. Vascular/Lymphatic: Moderate aortoiliac atherosclerotic disease. There is DVT extending from the  visualized left common femoral vein into the left common iliac vein. Faint hypodensity in the right common femoral vein may be artifactual and related to mixing of contrast or represent additional DVT. The IVC is unremarkable. There is mild compression of the left common iliac vein between the spine and right common iliac artery. No portal venous gas. There is no adenopathy. Reproductive: Hysterectomy.  No adnexal masses. Other: None Musculoskeletal: Osteopenia with degenerative changes of the spine. Grade 1 L4-L5 anterolisthesis. T11 hemangioma. No acute osseous pathology. IMPRESSION: 1. No acute intra-abdominal or pelvic pathology. 2. DVT extending from the visualized left common femoral vein into the left common iliac vein. 3. Small cystic lesions in the body of the pancreas as above, likely represents a side branch IPMN. Further characterization with MRI without and with contrast on a nonemergent/outpatient basis recommended. 4.  Aortic Atherosclerosis (ICD10-I70.0). These results were called by telephone at the time of interpretation on 11/30/2021 at 9:12 pm to [facility], who verbally acknowledged these results. Electronically Signed   By: AAnner CreteM.D.   On: 11/30/2021 21:13   DG Pelvis Portable  Result Date: 11/30/2021 CLINICAL DATA:  Level 2 trauma due to a fall. EXAM: PORTABLE PELVIS 1-2 VIEWS COMPARISON:  None Available. FINDINGS: Degenerative changes in the lower lumbar spine and hips. Pelvis appears intact. No acute displaced fractures identified. SI joints and symphysis pubis are nondisplaced. Visualized sacrum appears intact. IMPRESSION: No acute bony abnormalities. Electronically Signed   By: WLucienne CapersM.D.   On: 11/30/2021 19:43   DG Chest Portable 1 View  Result Date: 11/30/2021 CLINICAL DATA:  Level 2 trauma due to a fall. EXAM: PORTABLE CHEST 1 VIEW COMPARISON:  07/25/2021 FINDINGS: Shallow inspiration. Heart size and pulmonary vascularity are normal. Lungs  are clear. No pleural effusions. No pneumothorax. Calcified aorta. Degenerative changes in the spine and shoulders. IMPRESSION: Shallow inspiration.  No evidence of active pulmonary disease. Electronically Signed   By: Lucienne Capers M.D.   On: 11/30/2021 19:43   CT Cervical Spine Wo Contrast  Result Date: 11/30/2021 CLINICAL DATA:  Neck trauma (Age >= 65y).  Fall. EXAM: CT CERVICAL SPINE WITHOUT CONTRAST TECHNIQUE: Multidetector CT imaging of the cervical spine was performed without intravenous contrast. Multiplanar CT image reconstructions were also generated. RADIATION DOSE REDUCTION: This exam was performed according to the departmental dose-optimization program which includes automated exposure control, adjustment of the mA and/or kV according to patient size and/or use of iterative reconstruction technique. COMPARISON:  None Available. FINDINGS: Alignment: Normal Skull base and vertebrae: No acute fracture. No primary bone lesion or focal pathologic process. Soft tissues and spinal canal: No prevertebral fluid or swelling. No visible canal hematoma. Disc levels: Diffuse moderate degenerative disc disease and facet disease. No disc herniation. Upper chest: No acute findings Other: Bilateral thyroid nodules, the largest in the right thyroid lobe appears to be solid and measures 3 cm. IMPRESSION: Degenerative disc and facet disease.  No acute bony abnormality. Multiple bilateral thyroid nodules, the largest 3 cm in the right thyroid lobe. Recommend non emergent thyroid US (ref: J Am Coll Radiol. 2015 Feb;12(2): 143-50). Electronically Signed   By: Rolm Baptise M.D.   On: 11/30/2021 19:37   CT HEAD WO CONTRAST (5MM)  Result Date: 11/30/2021 CLINICAL DATA:  Head trauma, minor (Age >= 65y).  Fall EXAM: CT HEAD WITHOUT CONTRAST TECHNIQUE: Contiguous axial images were obtained from the base of the skull through the vertex without intravenous contrast. RADIATION DOSE REDUCTION: This exam was performed  according to the departmental dose-optimization program which includes automated exposure control, adjustment of the mA and/or kV according to patient size and/or use of iterative reconstruction technique. COMPARISON:  07/24/2021 FINDINGS: Brain: There is atrophy and chronic small vessel disease changes. 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 Old frontal parietal infarct. No acute intracranial abnormality. Specifically, no hemorrhage, hydrocephalus, mass lesion, acute infarction, or significant intracranial injury. Vascular: No hyperdense vessel or unexpected calcification. Skull: No acute calvarial abnormality. 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 Atrophy, chronic microvascular disease. No acute intracranial abnormality. Electronically Signed   By: Rolm Baptise M.D.   On: 11/30/2021 19:34    Assessment and Plan  Left lower extremity DVT Patient states she stays in bed most of the time which is the most likely precipitating factor.  No recent travel or surgeries.  CT abdomen pelvis showing DVT extending from the left common femoral vein into the left common iliac vein.  PE less likely given no tachycardia, hypoxia, chest pain, or shortness of breath. -ED physician discussed the case with Dr. Carlis Abbott with vascular surgery who felt that this clot would require thrombectomy but patient would not be able to tolerate this procedure.  He recommended continuing anticoagulation at this time.  -Continue IV heparin at this time with plan to transition to DOAC versus possible IVC filter.  There needs to be a discussion with the patient and her family in the morning regarding risks versus benefits of long-term anticoagulation in the setting of recent  fall. -Lower extremity Dopplers ordered for further evaluation  Elevated transaminases AST 175, ALT 110.  Alkaline phosphatase and T. bili normal.   CT negative for acute hepatobiliary abnormality. -Repeat LFTs -Acute hepatitis panel  Multiple bilateral thyroid nodules CT showing multiple bilateral thyroid nodules with the largest measuring 3 cm in the right thyroid lobe. -Check TSH, free T4 -Will need nonemergent thyroid ultrasound for further evaluation.  Pancreatic cystic lesions CT showing small cystic lesions in the body of the pancreas, likely representing a side branch IPMN. -Radiologist recommending further characterization with MRI without and with contrast on a nonemergent/outpatient basis.  Poorly controlled insulin-dependent type 2 diabetes Glucose in the 300s, bicarb 21 with normal anion gap.  No ketones on UA.  A1c 11.1 in July 2023. -Continue home basal and scheduled preprandial insulin after pharmacy med rec is done. -Moderate sliding scale insulin ACHS -Hold oral hypoglycemic agents  History of CVA: CT head negative for acute finding and no focal neurodeficit on exam. Hypertension: Blood pressure currently stable. Hyperlipidemia Mood disorder -Pharmacy med rec pending.  DVT prophylaxis: IV heparin gtt Code Status: Full Code (discussed with the patient) Family Communication: No family available at this time. Consults called: Vascular surgery Level of care: Telemetry bed Admission status: It is my clinical opinion that referral for OBSERVATION is reasonable and necessary in this patient based on the above information provided. The aforementioned taken together are felt to place the patient at high risk for further clinical deterioration. However, it is anticipated that the patient may be medically stable for discharge from the hospital within 24 to 48 hours.   Shela Leff MD [facility]  If 7PM-7AM, please contact night-coverage [email or web address]  12/01/2021, 5:28 AM

## 2021-12-01 NOTE — ED Notes (Signed)
Pt refusing US

## 2021-12-01 NOTE — Progress Notes (Signed)
ANTICOAGULATION CONSULT NOTE - Follow Up Consult  Pharmacy Consult for heparin Indication: DVT  Labs: Recent Labs    11/30/21 1906 12/01/21 0349  HGB 13.5 13.6  HCT 39.9 39.5  PLT 236 225  HEPARINUNFRC  --  0.64  CREATININE 0.99  --     Assessment/Plan:  81yo female therapeutic on heparin with initial dosing for DVT. Will continue infusion at current rate of 1100 units/hr and confirm stable with additional level.   Wynona Neat, PharmD, BCPS  12/01/2021,5:04 AM

## 2021-12-01 NOTE — ED Notes (Addendum)
This pt found with both IVs out and repeating "this is bullshit and I'm going home." Pt continually trying to hit staff. Pt pulling at cords and taking them out of the wall. Pt able to tell this RN her name but does not understand that we are in the hospital and that we are here to help her. MD made aware

## 2021-12-01 NOTE — Progress Notes (Signed)
PROGRESS NOTE        PATIENT DETAILS Name: Wanda [name] Age: 81 y.o. Sex: female Date of Birth: 02-Oct-1940 Admit Date: 11/30/2021 Admitting Physician Shela Leff, MD IRC:[id number], Gay Filler, MD  Brief Summary: Patient is a 81 y.o.  female with history of CVA, HTN, HLD, DM-2-who presented from SNF following a mechanical fall, upon further imaging she was found to have left lower extremity DVT and admitted to the hospitalist service.  Significant events: 11/22>> to ED from SNF following a mechanical fall.  CT imaging done to evaluate for trauma/fracture-revealed LLE DVT.  Admit to [facility].  Significant studies: 11/22>> CT head: No acute intracranial abnormality. 11/22>> CT C-spine: No acute bony abnormality. 11/22>> CXR: No PNA 11/22>> x-ray pelvis: No acute bony abnormalities 11/22>> CT abdomen/pelvis: No acute abnormality-DVT extending from visualize left common femoral vein into left common iliac vein.  Small cystic lesion in the body of the pancreas.  Significant microbiology data: None  Procedures: None  Consults: None  Subjective: Somewhat pleasantly confused-but easily redirectable.  Uncooperative-barely open eyes when I was in the room.  But was able to answer most of my questions appropriately.  No family at bedside.  Objective: Vitals: Blood pressure (!) 164/70, pulse 87, temperature 98 F (36.7 C), temperature source Oral, resp. rate 16, height '5\' 1"'$  (1.549 m), weight 75.8 kg, SpO2 98 %.   Exam: Gen Exam:not in any distress HEENT:atraumatic, normocephalic Chest: B/L clear to auscultation anteriorly CVS:S1S2 regular Abdomen:soft non tender, non distended Extremities:no edema Neurology: Seems to move all 4 extremities symmetrically-difficult exam Skin: no rash  Pertinent Labs/Radiology:    Latest Ref Rng & Units 12/01/2021    3:49 AM 11/30/2021    7:06 PM 07/27/2021    4:24 AM  CBC  WBC 4.0 - 10.5 K/uL 6.9  7.4  7.8    Hemoglobin 12.0 - 15.0 g/dL 13.6  13.5  13.0   Hematocrit 36.0 - 46.0 % 39.5  39.9  36.8   Platelets 150 - 400 K/uL 225  236  240     Lab Results  Component Value Date   NA 138 12/01/2021   K 4.1 12/01/2021   CL 105 12/01/2021   CO2 23 12/01/2021      Assessment/Plan: Mechanical fall Unclear circumstance as to how she fell Denies syncope (has DVT-could have had a syncopal episode due to PE) No family at bedside Imaging negative for fractures-see above Mobilize with PT/OT  LLE DVT  Incidental finding on CT imaging Minimal left thigh swelling on exam Suspect provoked in the setting of poor mobility Remains on IV heparin-suspect can be switched to Eliquis Even if she were to have a small PE-she is not hypoxic-hemodynamically stable-doubt further workup would change management Note-admitting MD discussed with vascular surgery-felt to be a poor candidate for thrombectomy.  Minimally elevated transaminases Acute hepatitis serology negative CT abdomen without any acute abnormalities LFTs now downtrending Unclear clinical significance Follow AST/ALT periodically  DM-2 with uncontrolled hyperglycemia (A1c 11.1 on 7/17) Resume Semglee-started 15 units daily given erratic oral intake Continue SSI Follow/optimize accordingly  HTN BP remains uncontrolled Resume amlodipine/clonidine Follow/optimize  History of CVA Nonfocal exam Since will be on anticoagulation-we will [place] stop aspirin.  Dementia Delirium Confused this am Maintain delirium precautions As needed Haldol Per daugther has had worsening memory issues over the last 6 months or so  Small cystic lesion body of pancreas Seen incidentally on CT abdomen Further workup including dedicated pancreatic MRI protocol deferred to the outpatient setting  Multiple thyroid nodules Incidental finding on CT C-spine Further workup deferred to the outpatient setting.  Obesity Estimated body mass index is 31.55 kg/m as  calculated from the following:   Height as of this encounter: '5\' 1"'$  (1.549 m).   Weight as of this encounter: 75.8 kg.   Code status:   Code Status: Full Code   DVT Prophylaxis: IV heparin>>eliquis  Family Communication:  Daughter-Kathy-[phone] updated over the phone 11/23  Disposition Plan: Status is: Observation The patient will require care spanning > 2 midnights and should be moved to inpatient because: severity of illness   Planned Discharge [place] in the next day or so   Diet: Diet Order             Diet heart healthy/carb modified Room service appropriate? Yes; Fluid consistency: Thin  Diet effective now                     Antimicrobial agents: Anti-infectives (From admission, onward)    None        MEDICATIONS: Scheduled Meds:  insulin aspart  0-15 Units Subcutaneous TID WC   insulin aspart  0-5 Units Subcutaneous QHS   Continuous Infusions:  heparin Stopped (12/01/21 0945)   PRN Meds:.haloperidol lactate   I have personally reviewed following labs and imaging studies  LABORATORY DATA: CBC: Recent Labs  Lab 11/30/21 1906 12/01/21 0349  WBC 7.4 6.9  NEUTROABS 4.9  --   HGB 13.5 13.6  HCT 39.9 39.5  MCV 88.3 88.6  PLT 236 878    Basic Metabolic Panel: Recent Labs  Lab 11/30/21 1906 12/01/21 0653  NA 134* 138  K 4.0 4.1  CL 100 105  CO2 21* 23  GLUCOSE 362* 303*  BUN 9 6*  CREATININE 0.99 0.71  CALCIUM 9.7 9.2    GFR: Estimated Creatinine Clearance: 51.4 mL/min (by C-G formula based on SCr of 0.71 mg/dL).  Liver Function Tests: Recent Labs  Lab 11/30/21 1906 12/01/21 0653  AST 175* 54*  ALT 110* 71*  ALKPHOS 89 80  BILITOT 1.0 1.2  PROT 7.2 6.3*  ALBUMIN 4.0 3.4*   No results for input(s): "LIPASE", "AMYLASE" in the last 168 hours. No results for input(s): "AMMONIA" in the last 168 hours.  Coagulation Profile: No results for input(s): "INR", "PROTIME" in the last 168  hours.  Cardiac Enzymes: No results for input(s): "CKTOTAL", "CKMB", "CKMBINDEX", "TROPONINI" in the last 168 hours.  BNP (last 3 results) No results for input(s): "PROBNP" in the last 8760 hours.  Lipid Profile: No results for input(s): "CHOL", "HDL", "LDLCALC", "TRIG", "CHOLHDL", "LDLDIRECT" in the last 72 hours.  Thyroid Function Tests: Recent Labs    12/01/21 0653  TSH 1.041  FREET4 1.51*    Anemia Panel: No results for input(s): "VITAMINB12", "FOLATE", "FERRITIN", "TIBC", "IRON", "RETICCTPCT" in the last 72 hours.  Urine analysis:    Component Value Date/Time   COLORURINE STRAW (A) 11/30/2021 1950   APPEARANCEUR CLEAR 11/30/2021 1950   LABSPEC 1.003 (L) 11/30/2021 1950   PHURINE 7.0 11/30/2021 1950   GLUCOSEU >=500 (A) 11/30/2021 1950   HGBUR NEGATIVE 11/30/2021 1950   BILIRUBINUR NEGATIVE 11/30/2021 1950   BILIRUBINUR neg 07/18/2012 [place] 11/30/2021 1950   PROTEINUR NEGATIVE 11/30/2021 1950   UROBILINOGEN 0.2 07/18/2012 0938   NITRITE NEGATIVE 11/30/2021 1950  LEUKOCYTESUR NEGATIVE 11/30/2021 1950    Sepsis Labs: Lactic Acid, Venous No results found for: "LATICACIDVEN"  MICROBIOLOGY: No results found for this or any previous visit (from the past 240 hour(s)).  RADIOLOGY STUDIES/RESULTS: CT ABDOMEN PELVIS W CONTRAST  Result Date: 11/30/2021 CLINICAL DATA:  Blunt abdominal trauma. EXAM: CT ABDOMEN AND PELVIS WITH CONTRAST TECHNIQUE: Multidetector CT imaging of the abdomen and pelvis was performed using the standard protocol following bolus administration of intravenous contrast. RADIATION DOSE REDUCTION: This exam was performed according to the departmental dose-optimization program which includes automated exposure control, adjustment of the mA and/or kV according to patient size and/or use of iterative reconstruction technique. CONTRAST:  39m OMNIPAQUE IOHEXOL 350 MG/ML SOLN COMPARISON:  None Available. FINDINGS: Lower chest: The  visualized lung bases are clear. There is 3 vessel coronary vascular calcification. There is calcification of the mitral annulus. No intra-abdominal free air or free fluid. Hepatobiliary: The liver is unremarkable. No biliary dilatation. The gallbladder is unremarkable. Pancreas: 2 small cystic lesions in the body of the pancreas measuring up to 15 mm are not characterized on this CT but likely represents a side branch IPMN. Further characterization with MRI without and with contrast on a nonemergent/outpatient basis recommended. No dilatation of the main pancreatic duct or gland atrophy. Spleen: Normal in size without focal abnormality. Adrenals/Urinary Tract: The adrenal glands are unremarkable. Small left renal interpolar cyst. No follow-up by imaging. There is no hydronephrosis on either side. There is symmetric enhancement and excretion of contrast by both kidneys. The visualized ureters and urinary bladder appear unremarkable. Stomach/Bowel: There is no bowel obstruction or active inflammation. The appendix is normal. Vascular/Lymphatic: Moderate aortoiliac atherosclerotic disease. There is DVT extending from the visualized left common femoral vein into the left common iliac vein. Faint hypodensity in the right common femoral vein may be artifactual and related to mixing of contrast or represent additional DVT. The IVC is unremarkable. There is mild compression of the left common iliac vein between the spine and right common iliac artery. No portal venous gas. There is no adenopathy. Reproductive: Hysterectomy.  No adnexal masses. Other: None Musculoskeletal: Osteopenia with degenerative changes of the spine. Grade 1 L4-L5 anterolisthesis. T11 hemangioma. No acute osseous pathology. IMPRESSION: 1. No acute intra-abdominal or pelvic pathology. 2. DVT extending from the visualized left common femoral vein into the left common iliac vein. 3. Small cystic lesions in the body of the pancreas as above, likely  represents a side branch IPMN. Further characterization with MRI without and with contrast on a nonemergent/outpatient basis recommended. 4.  Aortic Atherosclerosis (ICD10-I70.0). These results were called by telephone at the time of interpretation on 11/30/2021 at 9:12 pm to provider [facility], who verbally acknowledged these results. Electronically Signed   By: AAnner CreteM.D.   On: 11/30/2021 21:13   DG Pelvis Portable  Result Date: 11/30/2021 CLINICAL DATA:  Level 2 trauma due to a fall. EXAM: PORTABLE PELVIS 1-2 VIEWS COMPARISON:  None Available. FINDINGS: Degenerative changes in the lower lumbar spine and hips. Pelvis appears intact. No acute displaced fractures identified. SI joints and symphysis pubis are nondisplaced. Visualized sacrum appears intact. IMPRESSION: No acute bony abnormalities. Electronically Signed   By: WLucienne CapersM.D.   On: 11/30/2021 19:43   DG Chest Portable 1 View  Result Date: 11/30/2021 CLINICAL DATA:  Level 2 trauma due to a fall. EXAM: PORTABLE CHEST 1 VIEW COMPARISON:  07/25/2021 FINDINGS: Shallow inspiration. Heart size and pulmonary vascularity are normal. Lungs are clear.  No pleural effusions. No pneumothorax. Calcified aorta. Degenerative changes in the spine and shoulders. IMPRESSION: Shallow inspiration.  No evidence of active pulmonary disease. Electronically Signed   By: Lucienne Capers M.D.   On: 11/30/2021 19:43   CT Cervical Spine Wo Contrast  Result Date: 11/30/2021 CLINICAL DATA:  Neck trauma (Age >= 65y).  Fall. EXAM: CT CERVICAL SPINE WITHOUT CONTRAST TECHNIQUE: Multidetector CT imaging of the cervical spine was performed without intravenous contrast. Multiplanar CT image reconstructions were also generated. RADIATION DOSE REDUCTION: This exam was performed according to the departmental dose-optimization program which includes automated exposure control, adjustment of the mA and/or kV according to patient size and/or use of iterative  reconstruction technique. COMPARISON:  None Available. FINDINGS: Alignment: Normal Skull base and vertebrae: No acute fracture. No primary bone lesion or focal pathologic process. Soft tissues and spinal canal: No prevertebral fluid or swelling. No visible canal hematoma. Disc levels: Diffuse moderate degenerative disc disease and facet disease. No disc herniation. Upper chest: No acute findings Other: Bilateral thyroid nodules, the largest in the right thyroid lobe appears to be solid and measures 3 cm. IMPRESSION: Degenerative disc and facet disease.  No acute bony abnormality. Multiple bilateral thyroid nodules, the largest 3 cm in the right thyroid lobe. Recommend non emergent thyroid US (ref: J Am Coll Radiol. 2015 Feb;12(2): 143-50). Electronically Signed   By: Rolm Baptise M.D.   On: 11/30/2021 19:37   CT HEAD WO CONTRAST (5MM)  Result Date: 11/30/2021 CLINICAL DATA:  Head trauma, minor (Age >= 65y).  Fall EXAM: CT HEAD WITHOUT CONTRAST TECHNIQUE: Contiguous axial images were obtained from the base of the skull through the vertex without intravenous contrast. RADIATION DOSE REDUCTION: This exam was performed according to the departmental dose-optimization program which includes automated exposure control, adjustment of the mA and/or kV according to patient size and/or use of iterative reconstruction technique. COMPARISON:  07/24/2021 FINDINGS: Brain: There is atrophy and chronic small vessel disease changes. 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 Old frontal parietal infarct. No acute intracranial abnormality. Specifically, no hemorrhage, hydrocephalus, mass lesion, acute infarction, or significant intracranial injury. Vascular: No hyperdense vessel or unexpected calcification. Skull: No acute calvarial abnormality. 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 Atrophy, chronic  microvascular disease. No acute intracranial abnormality. Electronically Signed   By: Rolm Baptise M.D.   On: 11/30/2021 19:34     LOS: 0 days   Oren Binet, MD  [facility]    To contact the attending provider between 7A-7P or the covering provider during after hours 7P-7A, please log into the web site [email or web address] and access using universal [place] password for that web site. If you do not have the password, please call the hospital operator.  12/01/2021, 10:15 AM

## 2021-12-01 NOTE — Progress Notes (Signed)
Attempted Lower extremity venous duplex.  Patient refused. RN notified.   [name] [name] 12/01/2021 8:59 AM

## 2021-12-01 NOTE — Discharge Instructions (Addendum)
Information on my medicine - ELIQUIS (apixaban)  Why was Eliquis prescribed for you? Eliquis was prescribed to treat blood clots that may have been found in the veins of your legs (deep vein thrombosis) or in your lungs (pulmonary embolism) and to reduce the risk of them occurring again.  What do You need to know about Eliquis ? The starting dose is 10 mg (two 5 mg tablets) taken TWICE daily for the FIRST SEVEN (7) DAYS, then on Thursday November 30th, (12/08/2021)  the dose is reduced to ONE 5 mg tablet taken TWICE daily.  Eliquis may be taken with or without food.   Try to take the dose about the same time in the morning and in the evening. If you have difficulty swallowing the tablet whole please discuss with your pharmacist how to take the medication safely.  Take Eliquis exactly as prescribed and DO NOT stop taking Eliquis without talking to the doctor who prescribed the medication.  Stopping may increase your risk of developing a new blood clot.  Refill your prescription before you run out.  After discharge, you should have regular check-up appointments with your healthcare provider that is prescribing your Eliquis.    What do you do if you miss a dose? If a dose of ELIQUIS is not taken at the scheduled time, take it as soon as possible on the same day and twice-daily administration should be resumed. The dose should not be doubled to make up for a missed dose.  Important Safety Information A possible side effect of Eliquis is bleeding. You should call your healthcare provider right away if you experience any of the following: Bleeding from an injury or your nose that does not stop. Unusual colored urine (red or dark brown) or unusual colored stools (red or black). Unusual bruising for unknown reasons. A serious fall or if you hit your head (even if there is no bleeding).  Some medicines may interact with Eliquis and might increase your risk of bleeding or clotting while on  Eliquis. To help avoid this, consult your healthcare provider or pharmacist prior to using any new prescription or non-prescription medications, including herbals, vitamins, non-steroidal anti-inflammatory drugs (NSAIDs) and supplements.  This website has more information on Eliquis (apixaban): http://www.eliquis.com/eliquis/home

## 2021-12-02 ENCOUNTER — Inpatient Hospital Stay (HOSPITAL_COMMUNITY): Payer: Medicare Other

## 2021-12-02 ENCOUNTER — Other Ambulatory Visit (HOSPITAL_COMMUNITY): Payer: Self-pay

## 2021-12-02 DIAGNOSIS — I82890 Acute embolism and thrombosis of other specified veins: Secondary | ICD-10-CM

## 2021-12-02 DIAGNOSIS — I82412 Acute embolism and thrombosis of left femoral vein: Secondary | ICD-10-CM

## 2021-12-02 DIAGNOSIS — I824Y2 Acute embolism and thrombosis of unspecified deep veins of left proximal lower extremity: Secondary | ICD-10-CM | POA: Diagnosis not present

## 2021-12-02 DIAGNOSIS — E785 Hyperlipidemia, unspecified: Secondary | ICD-10-CM | POA: Diagnosis not present

## 2021-12-02 DIAGNOSIS — I10 Essential (primary) hypertension: Secondary | ICD-10-CM | POA: Diagnosis not present

## 2021-12-02 DIAGNOSIS — R748 Abnormal levels of other serum enzymes: Secondary | ICD-10-CM | POA: Diagnosis not present

## 2021-12-02 DIAGNOSIS — I82432 Acute embolism and thrombosis of left popliteal vein: Secondary | ICD-10-CM

## 2021-12-02 DIAGNOSIS — Z8673 Personal history of transient ischemic attack (TIA), and cerebral infarction without residual deficits: Secondary | ICD-10-CM

## 2021-12-02 LAB — GLUCOSE, CAPILLARY
Glucose-Capillary: 239 mg/dL — ABNORMAL HIGH (ref 70–99)
Glucose-Capillary: 267 mg/dL — ABNORMAL HIGH (ref 70–99)
Glucose-Capillary: 235 mg/dL — ABNORMAL HIGH (ref 70–99)

## 2021-12-02 MED ORDER — APIXABAN 5 MG PO TABS: 10.0000 mg | ORAL_TABLET | Freq: Two times a day (BID) | ORAL | Status: DC

## 2021-12-02 MED ORDER — APIXABAN 5 MG PO TABS: 5.0000 mg | ORAL_TABLET | Freq: Two times a day (BID) | ORAL | Status: DC

## 2021-12-02 MED ORDER — QUETIAPINE FUMARATE 25 MG PO TABS: 25.0000 mg | ORAL_TABLET | Freq: Every day | ORAL | Status: DC

## 2021-12-02 MED ORDER — APIXABAN 5 MG PO TABS: 10.0000 mg | ORAL_TABLET | Freq: Two times a day (BID) | ORAL | 0 refills | Status: DC

## 2021-12-02 NOTE — TOC Transition Note (Signed)
Transition of Care [facility]) - CM/SW Discharge Note   Patient Details  Name: Wanda [name] MRN: [id number] Date of Birth: [birth year]  [facility] CM/SW Contact:  Bethann Berkshire, [place] Phone Number: 12/02/2021, 2:15 PM   Clinical Narrative:     DC summary, fl2, and [place] orders sent to [facility]. CSW called and confirmed they received fax and pt can return.   Patient will DC to: [facility]F Anticipated DC date: 12/02/21 Family notified:   Mosie Epstein (Daughter) [phone] ([place])   Transport by: Corey Harold   Per MD patient ready for DC to [facility]. RN, patient, patient's family, and facility notified of DC. Discharge Summary and FL2 sent to facility. RN to call report prior to discharge ([phone] ). DC packet on chart. Ambulance transport requested for patient.   CSW will sign off for now as social work intervention is no longer needed. Please consult [place] again if new needs arise.   Final next level of care: Assisted Living Barriers to Discharge: No Barriers Identified   Patient Goals and CMS Choice        Discharge Placement              Patient chooses bed at:  [facility]) Patient to be transferred to facility by: [place] Name of family member notified: Daughter [name] Patient and family notified of of transfer: 12/02/21  Discharge Plan and Services                                     Social Determinants of Health (SDOH) Interventions     Readmission Risk Interventions     No data to display

## 2021-12-02 NOTE — NC FL2 (Signed)
[place] LEVEL OF CARE SCREENING TOOL     IDENTIFICATION  Patient Name: Wanda [name] Birthdate: 12/28/1940 Sex: female Admission Date (Current Location): 11/30/2021  [facility] and [place] Number:  Herbalist and Address:  [facility] [place], [place] [address], [place], [place] [address]      Provider Number: [id number]  Attending Physician Name and Address:  Jonetta Osgood, MD  Relative Name and Phone Number:  Mosie Epstein (Daughter)  [phone] [facility])    Current Level of Care: Hospital Recommended Level of Care: [place] Prior Approval Number:    Date Approved/Denied:   PASRR Number:    Discharge Plan:  (ALF)    Current Diagnoses: Patient Active Problem List   Diagnosis Date Noted   Acute DVT (deep venous thrombosis) ([place]) 12/01/2021   Elevated liver enzymes 12/01/2021   History of CVA (cerebrovascular accident) 12/01/2021   Mood disorder ([place]) 12/01/2021   DVT (deep venous thrombosis) ([place]) 12/01/2021   Hypokalemia 07/26/2021   Fever 07/25/2021   Acute stroke due to ischemia ([place]) 07/24/2021   Carotid stenosis 11/13/2018   Brainstem infarct, acute ([place])    Primary osteoarthritis of left knee 10/04/2015   Osteoporosis 08/23/2015   Multiple thyroid nodules 04/07/2014   Insulin dependent type 2 diabetes mellitus ([place]) 10/09/2013   Insulin adverse reaction 09/27/2013   Senile nuclear sclerosis 08/14/2012   Essential hypertension 03/10/2011   Hyperlipidemia with target LDL less than 70 03/10/2011   Obesity (BMI 30-39.9) 03/10/2011    Orientation RESPIRATION BLADDER Height & Weight     Self  Normal Incontinent Weight: 149 lb 0.5 oz (67.6 kg) Height:  _0  (154.9 cm)  BEHAVIORAL SYMPTOMS/MOOD NEUROLOGICAL BOWEL NUTRITION STATUS      Continent Diet (Regular diet)  AMBULATORY STATUS COMMUNICATION OF NEEDS Skin   Extensive Assist Verbally Normal                       Personal Care  Assistance Level of Assistance  Bathing, Feeding, Dressing Bathing Assistance: Maximum assistance Feeding assistance: Independent Dressing Assistance: Maximum assistance     Functional Limitations Info  Sight, Hearing, Speech Sight Info: Adequate Hearing Info: Adequate Speech Info: Adequate    SPECIAL CARE FACTORS FREQUENCY  PT (By licensed PT), OT (By licensed OT)     PT Frequency: 2-3x/week OT Frequency: 2-3x/week            Contractures Contractures Info: Not present    Additional Factors Info  Code Status, Allergies Code Status Info: Full code Allergies Info: Ace Inhibitors, aspirin, Alendronate Sodium, shellfish allergy, statins, Crestor (rosuvastatin), metformin and related, Lipitor (atorvastatin Calcium)             Discharge Medications: STOP taking these medications     clopidogrel 75 MG tablet Commonly known as: PLAVIX           TAKE these medications     acetaminophen 325 MG tablet Commonly known as: TYLENOL Take 650 mg by mouth in the morning and at bedtime.    amLODipine 5 MG tablet Commonly known as: NORVASC TAKE 1 TABLET(5 MG) BY MOUTH DAILY What changed:  how much to take how to take this when to take this additional instructions    apixaban 5 MG Tabs tablet Commonly known as: ELIQUIS Take 2 tablets (10 mg total) by mouth 2 (two) times daily for 7 days. 7 days from 11/23    apixaban 5 MG Tabs tablet Commonly  known as: ELIQUIS Take 1 tablet (5 mg total) by mouth 2 (two) times daily. Start on 11/30 Start taking on: December 08, 2021    blood glucose meter kit and supplies Dispense based on patient and insurance preference. Pt just needs meter    cloNIDine 0.1 MG tablet Commonly known as: CATAPRES Take 0.1 mg by mouth 2 (two) times daily.    glipiZIDE 5 MG 24 hr tablet Commonly known as: GLUCOTROL XL Take 2 tabs in the morning and 1 tab in the evening. What changed:  how much to take how to take this when to take this     glucose blood test strip Commonly known as: ONE TOUCH ULTRA TEST Test blood sugar 3 times a day. Dx code: 250.00    OneTouch Ultra test strip Generic drug: glucose blood Use as instructed    insulin aspart 100 UNIT/ML injection Commonly known as: novoLOG Inject 3 Units into the skin 3 (three) times daily with meals.    insulin glargine-yfgn 100 UNIT/ML injection Commonly known as: SEMGLEE Inject 0.3 mLs (30 Units total) into the skin daily.    Insulin Pen Needle 32G X 4 MM Misc Commonly known as: BD Pen Needle Nano U/F USE TO INJECT INSULIN ONCE DAILY    INSULIN SYRINGE .5CC/31GX5/16" 31G X 5/16" 0.5 ML Misc Use to inject insulin 1 time daily.    Lancets Misc 1 each by Does not apply route 3 (three) times daily. Dx: E11.65    ONE TOUCH ULTRA MINI w/Device Kit Use to test blood sugar daily as instructed. Dx: E11.65    senna-docusate 8.6-50 MG tablet Commonly known as: Senokot-S Take 1 tablet by mouth at bedtime as needed for moderate constipation. What changed:  how much to take when to take this    venlafaxine XR 37.5 MG 24 hr capsule Commonly known as: Effexor XR Take 1 capsule (37.5 mg total) by mouth daily with breakfast. Increase to 75 mg after 1 week What changed: how much to take   Relevant Imaging Results:  Relevant Lab Results:   Additional Information SS#: [id number]  Bethann Berkshire, LCSW

## 2021-12-02 NOTE — Progress Notes (Signed)
   12/02/21 1000  Mobility  Activity Transferred from chair to bed  Level of Assistance +2 (takes two people)  Assistive Device [employer] Ambulated (ft) 0 ft  Activity Response Tolerated well  Mobility Referral Yes  $Mobility charge 1 Mobility   Mobility Specialist Progress Note  Pt in bed and agreeable to transfer. Left in bed w/ all needs met and NT in room.   Lucious Groves Mobility Specialist

## 2021-12-02 NOTE — Evaluation (Signed)
Physical Therapy Evaluation Patient Details Name: Wanda [name] MRN: [id number] DOB: 07/23/1940 Today's Date: 12/02/2021  History of Present Illness  Pt is an 81 y.o. female presenting from SNF to [facility] ED 11/22 after a fall. CT revealed acute DVT LLE. PMH includes: CVA in July 2023, arthritis, carotid stenosis, DM2, HTN, urinary leakage, s/p L carotid endarterectomy, and bilateral TKA.   Clinical Impression  Pt in bed upon arrival of PT, agreeable to evaluation at this time. Prior to admission the pt was living at SNF, unable to give reliable information about mobility level or assist needed but pt does state she primarily uses WC and requires assist from staff to complete transfers. The pt required maxA of 2 to complete transfer from bed-recliner at this time due to poor motor planning, coordination, strength, power, and strong posterior lean despite cues and assist. VSS with mobility, and pt reports no pain or change in sx with change in position, will be safe to return to rehab when medically stable for d/c.    Recommendations for follow up therapy are one component of a multi-disciplinary discharge planning process, led by the attending physician.  Recommendations may be updated based on patient status, additional functional criteria and insurance authorization.  Follow Up Recommendations Skilled nursing-short term rehab (<3 hours/day) Can patient physically be transported by private vehicle: No    Assistance Recommended at Discharge Frequent or constant Supervision/Assistance  Patient can return home with the following  A lot of help with walking and/or transfers;A lot of help with bathing/dressing/bathroom;Assistance with cooking/housework;Assistance with feeding;Direct supervision/assist for medications management;Direct supervision/assist for financial management;Assist for transportation;Help with stairs or ramp for entrance    Equipment Recommendations Other (comment) (defer to  SNF)  Recommendations for Other Services       Functional Status Assessment Patient has had a recent decline in their functional status and demonstrates the ability to make significant improvements in function in a reasonable and predictable amount of time.     Precautions / Restrictions Precautions Precautions: Fall Precaution Comments: admitted for fall Restrictions Weight Bearing Restrictions: No      Mobility  Bed Mobility Overal bed mobility: Needs Assistance Bed Mobility: Rolling, Sidelying to Sit Rolling: Min assist Sidelying to sit: Mod assist       General bed mobility comments: minA with cues to roll, increased time and cues to complete transition to sitting    Transfers Overall transfer level: Needs assistance Equipment used: 2 person hand held assist Transfers: Sit to/from Stand, Bed to chair/wheelchair/BSC Sit to Stand: Max assist, +2 physical assistance Stand pivot transfers: Max assist, +2 physical assistance         General transfer comment: attempted with maxA of 2, needed maxA of 2 due to strong posterior lean and inability to extend at hips cauing BLE to slide forwards outside of BOS. pt uable to correct despite cues. unable to manage steps for pivot    Ambulation/Gait               General Gait Details: pt unable to take steps   Modified Rankin (Stroke Patients Only) Modified Rankin (Stroke Patients Only) Pre-Morbid Rankin Score: Severe disability Modified Rankin: Severe disability     Balance Overall balance assessment: Needs assistance Sitting-balance support: Single extremity supported Sitting balance-Leahy Scale: Fair Sitting balance - Comments: static sitting without UE support at times, poor ability to reposition Postural control: Posterior lean Standing balance support: Bilateral upper extremity supported, During functional activity Standing balance-Leahy Scale: Zero Standing balance  comment: dependent on staff, posterior  lean, unable to correct                             Pertinent Vitals/Pain Pain Assessment Pain Assessment: No/denies pain    Home Living Family/patient expects to be discharged to:: Skilled nursing facility                   Additional Comments: pt unreliable historian, information from chart and RN staff. Pt from [facility]    Prior Function Prior Level of Function : Needs assist             Mobility Comments: pt reports getting assist from staff to [facility], primarily in bed or WC at facility. ADLs Comments: pt states she can dress herself, but gets assist from staff at facility. working with therapy. pt is unreliable historian     Hand Dominance   Dominant Hand: Right    Extremity/Trunk Assessment   Upper Extremity Assessment Upper Extremity Assessment: Defer to OT evaluation    Lower Extremity Assessment Lower Extremity Assessment: Generalized weakness (able to move against gravity well, poor coordination and motor planning for functional transfers)    Cervical / Trunk Assessment Cervical / Trunk Assessment: Kyphotic  Communication   Communication: No difficulties  Cognition Arousal/Alertness: Awake/alert Behavior During Therapy: WFL for tasks assessed/performed Overall Cognitive Status: No family/caregiver present to determine baseline cognitive functioning                                 General Comments: pt unreliable historian, unable to name rehab facility and at times unsure of assist needed at facility        General Comments General comments (skin integrity, edema, etc.): VSS on RA    Exercises     Assessment/Plan    PT Assessment Patient needs continued PT services  PT Problem List Decreased strength;Decreased activity tolerance;Decreased balance;Decreased mobility;Decreased coordination;Decreased cognition;Decreased safety awareness       PT Treatment Interventions DME instruction;Gait  training;Functional mobility training;Therapeutic activities;Therapeutic exercise;Balance training;Neuromuscular re-education;Patient/family education    PT Goals (Current goals can be found in the Care Plan section)  Acute Rehab PT Goals Patient Stated Goal: return to rehab PT Goal Formulation: With patient Time For Goal Achievement: 12/16/21 Potential to Achieve Goals: Good    Frequency Min 2X/week     Co-evaluation PT/OT/SLP Co-Evaluation/Treatment: Yes Reason for Co-Treatment: Necessary to address cognition/behavior during functional activity;For patient/therapist safety;To address functional/ADL transfers PT goals addressed during session: Mobility/safety with mobility;Balance;Proper use of DME;Strengthening/ROM         AM-PAC PT "6 Clicks" Mobility  Outcome Measure Help needed turning from your back to your side while in a flat bed without using bedrails?: A Little Help needed moving from lying on your back to sitting on the side of a flat bed without using bedrails?: A Lot Help needed moving to and from a bed to a chair (including a wheelchair)?: Total Help needed standing up from a chair using your arms (e.g., wheelchair or bedside chair)?: Total Help needed to walk in hospital room?: Total Help needed climbing 3-5 steps with a railing? : Total 6 Click Score: 9    End of Session   Activity Tolerance: Patient tolerated treatment well Patient left: in chair;with call bell/phone within reach;with chair alarm set Nurse Communication: Mobility status PT Visit Diagnosis: Unsteadiness on feet (R26.81);Other  abnormalities of gait and mobility (R26.89)    Time: 0802-0822 PT Time Calculation (min) (ACUTE ONLY): 20 min   Charges:   PT Evaluation $PT Eval Low Complexity: 1 Low          West Carbo, PT, DPT   Acute Rehabilitation Department  Sandra Cockayne 12/02/2021, 8:40 AM

## 2021-12-02 NOTE — Progress Notes (Signed)
Pt has refused lab draws twice this shift. Compliant with medications but is visibly becoming agitated with bedside glucose checks and telemetry monitor. Call light in reach. Fall precautions in place.

## 2021-12-02 NOTE — Progress Notes (Signed)
PROGRESS NOTE        PATIENT DETAILS Name: Wanda [name] Age: 81 y.o. Sex: female Date of Birth: [birth year] Admit Date: 11/30/2021 Admitting Physician Evalee Mutton Kristeen Mans, MD JAS:[id number], Gay Filler, MD  Brief Summary: Patient is a 81 y.o.  female with history of CVA, HTN, HLD, DM-2-who presented from SNF following a mechanical fall, upon further imaging she was found to have left lower extremity DVT and admitted to the hospitalist service.  Significant events: 11/22>> to ED from SNF following a mechanical fall.  CT imaging done to evaluate for trauma/fracture-revealed LLE DVT.  Admit to [facility].  Significant studies: 11/22>> CT head: No acute intracranial abnormality. 11/22>> CT C-spine: No acute bony abnormality. 11/22>> CXR: No PNA 11/22>> x-ray pelvis: No acute bony abnormalities 11/22>> CT abdomen/pelvis: No acute abnormality-DVT extending from visualize left common femoral vein into left common iliac vein.  Small cystic lesion in the body of the pancreas.  Significant microbiology data: None  Procedures: None  Consults: None  Subjective: Some delirium overnight-pleasantly confused this morning.  Easily redirectable.  But was still refusing labs/EKG per RN.    Objective: Vitals: Blood pressure 113/62, pulse 92, temperature 97.7 F (36.5 C), temperature source Oral, resp. rate 18, height '5\' 1"'$  (1.549 m), weight 67.6 kg, SpO2 93 %.   Exam: Gen Exam:not in any distress HEENT:atraumatic, normocephalic Chest: B/L clear to auscultation anteriorly CVS:S1S2 regular Abdomen:soft non tender, non distended Extremities:no edema Neurology: Non focal Skin: no rash  Pertinent Labs/Radiology:    Latest Ref Rng & Units 12/01/2021    3:49 AM 11/30/2021    7:06 PM 07/27/2021    4:24 AM  CBC  WBC 4.0 - 10.5 K/uL 6.9  7.4  7.8   Hemoglobin 12.0 - 15.0 g/dL 13.6  13.5  13.0   Hematocrit 36.0 - 46.0 % 39.5  39.9  36.8   Platelets 150 - 400 K/uL 225   236  240     Lab Results  Component Value Date   NA 138 12/01/2021   K 4.1 12/01/2021   CL 105 12/01/2021   CO2 23 12/01/2021      Assessment/Plan: Mechanical fall Unclear circumstance as to how she fell Denies syncope (has DVT-could have had a syncopal episode due to PE) Imaging negative for fractures-see above Continue to mobilize with PT/OT Recommendations are for SNF (per social work-patient was at ALF)  LLE DVT  Incidental finding on CT imaging Minimal left thigh swelling on exam Suspect provoked in the setting of poor mobility Switched to Eliquis-no longer on heparin.   Even if she were to have a small PE-she is not hypoxic-hemodynamically stable-doubt further workup would change management Note-admitting MD discussed with vascular surgery-felt to be a poor candidate for thrombectomy.  Minimally elevated transaminases Acute hepatitis serology negative CT abdomen without any acute abnormalities LFTs now downtrending Unclear clinical significance Follow AST/ALT periodically  DM-2 with uncontrolled hyperglycemia (A1c 11.1 on 7/17) CBGs relatively stable-continue Semglee 15 units and SSI. Follow/optimize   Recent Labs    12/01/21 1641 12/01/21 2116 12/02/21 0601  GLUCAP 120* 185* 239*     HTN BP stable Continue amlodipine/clonidine Follow/optimize   History of CVA Nonfocal exam Since will be on anticoagulation-longer on ASA.  Dementia Delirium Continues to have intermittent delirium  Start low-dose Seroquel  Maintain delirium precautions Per daugther has had worsening memory  issues over the last 6 months or so  Small cystic lesion body of pancreas Seen incidentally on CT abdomen Further workup including dedicated pancreatic MRI protocol deferred to the outpatient setting  Multiple thyroid nodules Incidental finding on CT C-spine Further workup deferred to the outpatient setting.  Obesity Estimated body mass index is 28.16 kg/m as calculated  from the following:   Height as of this encounter: '5\' 1"'$  (1.549 m).   Weight as of this encounter: 67.6 kg.   Code status:   Code Status: Full Code   DVT Prophylaxis: IV heparin>>eliquis  Family Communication:  Daughter-Kathy-[phone] updated over the phone 11/23  Disposition Plan: Status is: Observation The patient will require care spanning > 2 midnights and should be moved to inpatient because: severity of illness   Planned Discharge Destination:Skilled nursing facility when bed available-currently medically stable for discharge.  Per social work-needs 3 midnight stay.  Previously was at ALF.   Diet: Diet Order             Diet heart healthy/carb modified Room service appropriate? Yes; Fluid consistency: Thin  Diet effective now                     Antimicrobial agents: Anti-infectives (From admission, onward)    None        MEDICATIONS: Scheduled Meds:  amLODipine  5 mg Oral Daily   apixaban  10 mg Oral BID   Followed by   Derrill Memo ON 12/08/2021] apixaban  5 mg Oral BID   cloNIDine  0.1 mg Oral BID   insulin aspart  0-15 Units Subcutaneous TID WC   insulin aspart  0-5 Units Subcutaneous QHS   insulin glargine-yfgn  15 Units Subcutaneous Daily   venlafaxine XR  75 mg Oral Q breakfast   Continuous Infusions:   PRN Meds:.haloperidol lactate   I have personally reviewed following labs and imaging studies  LABORATORY DATA: CBC: Recent Labs  Lab 11/30/21 1906 12/01/21 0349  WBC 7.4 6.9  NEUTROABS 4.9  --   HGB 13.5 13.6  HCT 39.9 39.5  MCV 88.3 88.6  PLT 236 225     Basic Metabolic Panel: Recent Labs  Lab 11/30/21 1906 12/01/21 0653  NA 134* 138  K 4.0 4.1  CL 100 105  CO2 21* 23  GLUCOSE 362* 303*  BUN 9 6*  CREATININE 0.99 0.71  CALCIUM 9.7 9.2     GFR: Estimated Creatinine Clearance: 48.5 mL/min (by C-G formula based on SCr of 0.71 mg/dL).  Liver Function Tests: Recent Labs  Lab 11/30/21 1906 12/01/21 0653  AST  175* 54*  ALT 110* 71*  ALKPHOS 89 80  BILITOT 1.0 1.2  PROT 7.2 6.3*  ALBUMIN 4.0 3.4*    No results for input(s): "LIPASE", "AMYLASE" in the last 168 hours. No results for input(s): "AMMONIA" in the last 168 hours.  Coagulation Profile: No results for input(s): "INR", "PROTIME" in the last 168 hours.  Cardiac Enzymes: No results for input(s): "CKTOTAL", "CKMB", "CKMBINDEX", "TROPONINI" in the last 168 hours.  BNP (last 3 results) No results for input(s): "PROBNP" in the last 8760 hours.  Lipid Profile: No results for input(s): "CHOL", "HDL", "LDLCALC", "TRIG", "CHOLHDL", "LDLDIRECT" in the last 72 hours.  Thyroid Function Tests: Recent Labs    12/01/21 0653  TSH 1.041  FREET4 1.51*     Anemia Panel: No results for input(s): "VITAMINB12", "FOLATE", "FERRITIN", "TIBC", "IRON", "RETICCTPCT" in the last 72 hours.  Urine analysis:  Component Value Date/Time   COLORURINE STRAW (A) 11/30/2021 1950   APPEARANCEUR CLEAR 11/30/2021 1950   LABSPEC 1.003 (L) 11/30/2021 1950   PHURINE 7.0 11/30/2021 1950   GLUCOSEU >=500 (A) 11/30/2021 1950   HGBUR NEGATIVE 11/30/2021 1950   BILIRUBINUR NEGATIVE 11/30/2021 1950   BILIRUBINUR neg 07/18/2012 Ward 11/30/2021 1950   PROTEINUR NEGATIVE 11/30/2021 1950   UROBILINOGEN 0.2 07/18/2012 0938   NITRITE NEGATIVE 11/30/2021 1950   LEUKOCYTESUR NEGATIVE 11/30/2021 1950    Sepsis Labs: Lactic Acid, Venous No results found for: "LATICACIDVEN"  MICROBIOLOGY: No results found for this or any previous visit (from the past 240 hour(s)).  RADIOLOGY STUDIES/RESULTS: CT ABDOMEN PELVIS W CONTRAST  Result Date: 11/30/2021 CLINICAL DATA:  Blunt abdominal trauma. EXAM: CT ABDOMEN AND PELVIS WITH CONTRAST TECHNIQUE: Multidetector CT imaging of the abdomen and pelvis was performed using the standard protocol following bolus administration of intravenous contrast. RADIATION DOSE REDUCTION: This exam was performed  according to the departmental dose-optimization program which includes automated exposure control, adjustment of the mA and/or kV according to patient size and/or use of iterative reconstruction technique. CONTRAST:  17m OMNIPAQUE IOHEXOL 350 MG/ML SOLN COMPARISON:  None Available. FINDINGS: Lower chest: The visualized lung bases are clear. There is 3 vessel coronary vascular calcification. There is calcification of the mitral annulus. No intra-abdominal free air or free fluid. Hepatobiliary: The liver is unremarkable. No biliary dilatation. The gallbladder is unremarkable. Pancreas: 2 small cystic lesions in the body of the pancreas measuring up to 15 mm are not characterized on this CT but likely represents a side branch IPMN. Further characterization with MRI without and with contrast on a nonemergent/outpatient basis recommended. No dilatation of the main pancreatic duct or gland atrophy. Spleen: Normal in size without focal abnormality. Adrenals/Urinary Tract: The adrenal glands are unremarkable. Small left renal interpolar cyst. No follow-up by imaging. There is no hydronephrosis on either side. There is symmetric enhancement and excretion of contrast by both kidneys. The visualized ureters and urinary bladder appear unremarkable. Stomach/Bowel: There is no bowel obstruction or active inflammation. The appendix is normal. Vascular/Lymphatic: Moderate aortoiliac atherosclerotic disease. There is DVT extending from the visualized left common femoral vein into the left common iliac vein. Faint hypodensity in the right common femoral vein may be artifactual and related to mixing of contrast or represent additional DVT. The IVC is unremarkable. There is mild compression of the left common iliac vein between the spine and right common iliac artery. No portal venous gas. There is no adenopathy. Reproductive: Hysterectomy.  No adnexal masses. Other: None Musculoskeletal: Osteopenia with degenerative changes of the  spine. Grade 1 L4-L5 anterolisthesis. T11 hemangioma. No acute osseous pathology. IMPRESSION: 1. No acute intra-abdominal or pelvic pathology. 2. DVT extending from the visualized left common femoral vein into the left common iliac vein. 3. Small cystic lesions in the body of the pancreas as above, likely represents a side branch IPMN. Further characterization with MRI without and with contrast on a nonemergent/outpatient basis recommended. 4.  Aortic Atherosclerosis (ICD10-I70.0). These results were called by telephone at the time of interpretation on 11/30/2021 at 9:12 pm to [facility], who verbally acknowledged these results. Electronically Signed   By: AAnner CreteM.D.   On: 11/30/2021 21:13   DG Pelvis Portable  Result Date: 11/30/2021 CLINICAL DATA:  Level 2 trauma due to a fall. EXAM: PORTABLE PELVIS 1-2 VIEWS COMPARISON:  None Available. FINDINGS: Degenerative changes in the lower lumbar spine and hips.  Pelvis appears intact. No acute displaced fractures identified. SI joints and symphysis pubis are nondisplaced. Visualized sacrum appears intact. IMPRESSION: No acute bony abnormalities. Electronically Signed   By: Lucienne Capers M.D.   On: 11/30/2021 19:43   DG Chest Portable 1 View  Result Date: 11/30/2021 CLINICAL DATA:  Level 2 trauma due to a fall. EXAM: PORTABLE CHEST 1 VIEW COMPARISON:  07/25/2021 FINDINGS: Shallow inspiration. Heart size and pulmonary vascularity are normal. Lungs are clear. No pleural effusions. No pneumothorax. Calcified aorta. Degenerative changes in the spine and shoulders. IMPRESSION: Shallow inspiration.  No evidence of active pulmonary disease. Electronically Signed   By: Lucienne Capers M.D.   On: 11/30/2021 19:43   CT Cervical Spine Wo Contrast  Result Date: 11/30/2021 CLINICAL DATA:  Neck trauma (Age >= 65y).  Fall. EXAM: CT CERVICAL SPINE WITHOUT CONTRAST TECHNIQUE: Multidetector CT imaging of the cervical spine was performed without  intravenous contrast. Multiplanar CT image reconstructions were also generated. RADIATION DOSE REDUCTION: This exam was performed according to the departmental dose-optimization program which includes automated exposure control, adjustment of the mA and/or kV according to patient size and/or use of iterative reconstruction technique. COMPARISON:  None Available. FINDINGS: Alignment: Normal Skull base and vertebrae: No acute fracture. No primary bone lesion or focal pathologic process. Soft tissues and spinal canal: No prevertebral fluid or swelling. No visible canal hematoma. Disc levels: Diffuse moderate degenerative disc disease and facet disease. No disc herniation. Upper chest: No acute findings Other: Bilateral thyroid nodules, the largest in the right thyroid lobe appears to be solid and measures 3 cm. IMPRESSION: Degenerative disc and facet disease.  No acute bony abnormality. Multiple bilateral thyroid nodules, the largest 3 cm in the right thyroid lobe. Recommend non emergent thyroid US (ref: J Am Coll Radiol. 2015 Feb;12(2): 143-50). Electronically Signed   By: Rolm Baptise M.D.   On: 11/30/2021 19:37   CT HEAD WO CONTRAST (5MM)  Result Date: 11/30/2021 CLINICAL DATA:  Head trauma, minor (Age >= 65y).  Fall EXAM: CT HEAD WITHOUT CONTRAST TECHNIQUE: Contiguous axial images were obtained from the base of the skull through the vertex without intravenous contrast. RADIATION DOSE REDUCTION: This exam was performed according to the departmental dose-optimization program which includes automated exposure control, adjustment of the mA and/or kV according to patient size and/or use of iterative reconstruction technique. COMPARISON:  07/24/2021 FINDINGS: Brain: There is atrophy and chronic small vessel disease changes. 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 Old frontal parietal infarct. No acute intracranial abnormality. Specifically, no hemorrhage, hydrocephalus,  mass lesion, acute infarction, or significant intracranial injury. Vascular: No hyperdense vessel or unexpected calcification. Skull: No acute calvarial abnormality. 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 Atrophy, chronic microvascular disease. No acute intracranial abnormality. Electronically Signed   By: Rolm Baptise M.D.   On: 11/30/2021 19:34     LOS: 1 day   Oren Binet, MD  [facility]    To contact the attending provider between 7A-7P or the covering provider during after hours 7P-7A, please log into the web site [email or web address] and access using universal [place] password for that web site. If you do not have the password, please call the hospital operator.  12/02/2021, 10:47 AM

## 2021-12-02 NOTE — Evaluation (Signed)
Occupational Therapy Evaluation and Discharge Patient Details Name: Wanda [name] MRN: [id number] DOB: 1940-09-01 Today's Date: 12/02/2021   History of Present Illness Pt is an 81 y.o. female presenting from SNF to [facility] ED 11/22 after a fall. CT revealed acute DVT LLE. PMH includes: CVA in July 2023, arthritis, carotid stenosis, DM2, HTN, urinary leakage, s/p L carotid endarterectomy, and bilateral TKA.   Clinical Impression   Pt is a resident of SNF and a poor historian, likely baseline cognition. Pt requires min to total assist for ADLs, min to mod assist for bed mobility and +2 max assist to transfer with posterior lean and inability to advance her feet. She transfer and w/c dependent at her SNF. Plan is to return to SNF. No acute OT needs.       Recommendations for follow up therapy are one component of a multi-disciplinary discharge planning process, led by the attending physician.  Recommendations may be updated based on patient status, additional functional criteria and insurance authorization.   Follow Up Recommendations  Long-term institutional care without follow-up therapy     Assistance Recommended at Discharge Frequent or constant Supervision/Assistance  Patient can return home with the following Two people to help with walking and/or transfers;Assistance with feeding;Direct supervision/assist for medications management;Direct supervision/assist for financial management;Assist for transportation;Help with stairs or ramp for entrance;Two people to help with bathing/dressing/bathroom    Functional Status Assessment  Patient has not had a recent decline in their functional status  Equipment Recommendations  None recommended by OT    Recommendations for Other Services       Precautions / Restrictions Precautions Precautions: Fall Precaution Comments: admitted for fall Restrictions Weight Bearing Restrictions: No      Mobility Bed Mobility Overal bed mobility:  Needs Assistance Bed Mobility: Rolling, Sidelying to Sit Rolling: Min assist Sidelying to sit: Mod assist       General bed mobility comments: minA with cues to roll, increased time and cues to complete transition to sitting    Transfers Overall transfer level: Needs assistance Equipment used: 2 person hand held assist Transfers: Sit to/from Stand, Bed to chair/wheelchair/BSC Sit to Stand: Max assist, +2 physical assistance Stand pivot transfers: Max assist, +2 physical assistance         General transfer comment: attempted with maxA of 2, needed maxA of 2 due to strong posterior lean and inability to extend at hips cauing BLE to slide forwards outside of BOS. pt uable to correct despite cues. unable to manage steps for pivot      Balance Overall balance assessment: Needs assistance Sitting-balance support: Single extremity supported Sitting balance-Leahy Scale: Fair   Postural control: Posterior lean Standing balance support: Bilateral upper extremity supported, During functional activity Standing balance-Leahy Scale: Zero Standing balance comment: dependent on staff, posterior lean, unable to correct                           ADL either performed or assessed with clinical judgement   ADL Overall ADL's : At baseline                                             Vision Ability to See in Adequate Light: 0 Adequate Patient Visual Report: No change from baseline       Perception     Praxis  Pertinent Vitals/Pain Pain Assessment Pain Assessment: No/denies pain     Hand Dominance Right   Extremity/Trunk Assessment Upper Extremity Assessment Upper Extremity Assessment: Generalized weakness   Lower Extremity Assessment Lower Extremity Assessment: Defer to PT evaluation   Cervical / Trunk Assessment Cervical / Trunk Assessment: Kyphotic   Communication Communication Communication: No difficulties   Cognition  Arousal/Alertness: Awake/alert Behavior During Therapy: WFL for tasks assessed/performed Overall Cognitive Status: No family/caregiver present to determine baseline cognitive functioning                                 General Comments: likely at baseline     General Comments  VSS on RA    Exercises     Shoulder Instructions      Home Living Family/patient expects to be discharged to:: Skilled nursing facility                                 Additional Comments: pt unreliable historian, information from chart and Conservation officer, historic buildings. Pt from [facility] in [place]      Prior Functioning/Environment Prior Level of Function : Needs assist             Mobility Comments: pt reports getting assist from staff to [facility], primarily in bed or WC at facility. ADLs Comments: pt states she can dress herself, but gets assist from staff at facility. working with therapy. pt is unreliable historian        OT Problem List:        OT Treatment/Interventions:      OT Goals(Current goals can be found in the care plan section)    OT Frequency:      Co-evaluation PT/OT/SLP Co-Evaluation/Treatment: Yes Reason for Co-Treatment: Necessary to address cognition/behavior during functional activity;For patient/therapist safety PT goals addressed during session: Mobility/safety with mobility;Balance;Proper use of DME;Strengthening/ROM OT goals addressed during session: ADL's and self-care      AM-PAC OT "6 Clicks" Daily Activity     Outcome Measure Help from another person eating meals?: A Little Help from another person taking care of personal grooming?: A Little Help from another person toileting, which includes using toliet, bedpan, or urinal?: Total Help from another person bathing (including washing, rinsing, drying)?: A Lot Help from another person to put on and taking off regular upper body clothing?: A Lot Help from another person to put on and taking off regular  lower body clothing?: Total 6 Click Score: 12   End of Session Equipment Utilized During Treatment: Gait belt Nurse Communication: Mobility status  Activity Tolerance: Patient tolerated treatment well Patient left: in chair;with call bell/phone within reach;with chair alarm set  OT Visit Diagnosis: Muscle weakness (generalized) (M62.81)                Time: 3825-0539 OT Time Calculation (min): 21 min Charges:  OT General Charges $OT Visit: 1 Visit OT Evaluation $OT Eval Moderate Complexity: Graham, OTR/L Acute Rehabilitation Services Office: [phone]   Malka So 12/02/2021, 9:03 AM

## 2021-12-02 NOTE — Discharge Summary (Signed)
PATIENT DETAILS Name: Wanda [name] Age: 81 y.o. Sex: female Date of Birth: 05-23-1940 MRN: [id number]. Admitting Physician: Jonetta Osgood, MD AQT:[id number], Gay Filler, MD  Admit Date: 11/30/2021 Discharge date: 12/02/2021  Recommendations for Outpatient Follow-up:  Follow up with PCP in 1-2 weeks Please obtain CMP/CBC in one week Thyroid nodule/cystic lesion in pancreas-further workup deferred to PCP-see below  Admitted From:  ALF   Disposition: Assisted living with home health   Discharge Condition: fair  CODE STATUS:   Code Status: Full Code   Diet recommendation:  Diet Order             Diet - low sodium heart healthy           Diet Carb Modified           Diet heart healthy/carb modified Room service appropriate? Yes; Fluid consistency: Thin  Diet effective now                    Brief Summary: Patient is a 81 y.o.  female with history of CVA, HTN, HLD, DM-2-who presented from SNF following a mechanical fall, upon further imaging she was found to have left lower extremity DVT and admitted to the hospitalist service.   Significant events: 11/22>> to ED from SNF following a mechanical fall.  CT imaging done to evaluate for trauma/fracture-revealed LLE DVT.  Admit to [facility].   Significant studies: 11/22>> CT head: No acute intracranial abnormality. 11/22>> CT C-spine: No acute bony abnormality. 11/22>> CXR: No PNA 11/22>> x-ray pelvis: No acute bony abnormalities 11/22>> CT abdomen/pelvis: No acute abnormality-DVT extending from visualize left common femoral vein into left common iliac vein.  Small cystic lesion in the body of the pancreas.   Significant microbiology data: None   Procedures: None   Consults: None  Brief Hospital Course: Mechanical fall Unclear circumstance as to how she fell Denies syncope (has DVT-could have had a syncopal episode due to PE) Imaging negative for fractures-see above Continue to mobilize with  PT/OT Initially SNF contemplated-but upon further discussion-social work recommending back to ALF with home health services.   LLE DVT  Incidental finding on CT imaging Minimal left thigh swelling on exam Suspect provoked in the setting of poor mobility Switched to Eliquis-no longer on heparin.   Even if she were to have a small PE-she is not hypoxic-hemodynamically stable-doubt further workup would change management Note-admitting MD discussed with vascular surgery-felt to be a poor candidate for thrombectomy.   Minimally elevated transaminases Acute hepatitis serology negative CT abdomen without any acute abnormalities LFTs now downtrending Unclear clinical significance Follow AST/ALT periodically   DM-2 with uncontrolled hyperglycemia (A1c 11.1 on 7/17) CBGs relatively stable-continue Semglee/SSI Resume oral hypoglycemics on discharge Follow/optimize  HTN BP stable Continue amlodipine/clonidine Follow/optimize    History of CVA Nonfocal exam Since will be on anticoagulation-longer on ASA.   Dementia Delirium Continues to have intermittent delirium  Maintain delirium precautions Per daugther has had worsening memory issues over the last 6 months or so   Small cystic lesion body of pancreas Seen incidentally on CT abdomen Further workup including dedicated pancreatic MRI protocol deferred to the outpatient setting   Multiple thyroid nodules Incidental finding on CT C-spine Further workup deferred to the outpatient setting.   Obesity Estimated body mass index is 28.16 kg/m as calculated from the following:   Height as of this encounter: _0  (1.549 m).   Weight as of this encounter: 67.6 kg.  Discharge Diagnoses:  Principal Problem:   Acute DVT (deep venous thrombosis) (HCC) Active Problems:   Essential hypertension   Hyperlipidemia with target LDL less than 70   Insulin dependent type 2 diabetes mellitus (HCC)   Multiple thyroid nodules   Elevated  liver enzymes   History of CVA (cerebrovascular accident)   Mood disorder ([place])   DVT (deep venous thrombosis) ([place])   Discharge Instructions: Check CBGs before meals and at bedtime  Activity:  As tolerated with Full fall precautions use walker/cane & assistance as needed   Discharge Instructions     Diet - low sodium heart healthy   Complete by: As directed    Diet Carb Modified   Complete by: As directed    Discharge instructions   Complete by: As directed    Follow with Primary MD  Cop[place], Gay Filler, MD in 1-2 weeks  Please get a complete blood count and chemistry panel checked by your Primary MD at your next visit, and again as instructed by your Primary MD.  Get Medicines reviewed and adjusted: Please take all your medications with you for your next visit with your Primary MD  Laboratory/radiological data: Please request your Primary MD to go over all hospital tests and procedure/radiological results at the follow up, please ask your Primary MD to get all Hospital records sent to his/her office.  In some cases, they will be blood work, cultures and biopsy results pending at the time of your discharge. Please request that your primary care M.D. follows up on these results.  Also Note the following: If you experience worsening of your admission symptoms, develop shortness of breath, life threatening emergency, suicidal or homicidal thoughts you must seek medical attention immediately by calling 911 or calling your MD immediately  if symptoms less severe.  You must read complete instructions/literature along with all the possible adverse reactions/side effects for all the Medicines you take and that have been prescribed to you. Take any new Medicines after you have completely understood and accpet all the possible adverse reactions/side effects.   Do not drive when taking Pain medications or sleeping medications (Benzodaizepines)  Do not take more than prescribed Pain,  Sleep and Anxiety Medications. It is not advisable to combine anxiety,sleep and pain medications without talking with your primary care practitioner  Special Instructions: If you have smoked or chewed Tobacco  in the last 2 yrs please stop smoking, stop any regular Alcohol  and or any Recreational drug use.  Wear Seat belts while driving.  Please note: You were cared for by a hospitalist during your hospital stay. Once you are discharged, your primary care physician will handle any further medical issues. Please note that NO REFILLS for any discharge medications will be authorized once you are discharged, as it is imperative that you return to your primary care physician (or establish a relationship with a primary care physician if you do not have one) for your post hospital discharge needs so that they can reassess your need for medications and monitor your lab values.   Increase activity slowly   Complete by: As directed       Allergies as of 12/02/2021       Reactions   Ace Inhibitors Diarrhea, Swelling, Other (See Comments), Cough   Pt had cough and diarrhea with first few doses of medication; also had swelling of right eyelid. Stopped medication on 03/17/11.   Alendronate Sodium Other (See Comments)   "Made my whole body hurt"  Aspirin Hives   Crestor [rosuvastatin] Other (See Comments)   "Made my whole body hurt"   Lipitor [atorvastatin Calcium] Other (See Comments)   "Made my whole body hurt"   Metformin And Related    Severe abdominal pain   Shellfish Allergy Nausea Only   Intolerant of fresh shellfish, reports the reaction is GI upset, denies hives, denies any swelling  Reports that she can tolerate canned seafood.    Statins Other (See Comments)   myalgia        Medication List     STOP taking these medications    clopidogrel 75 MG tablet Commonly known as: PLAVIX       TAKE these medications    acetaminophen 325 MG tablet Commonly known as: TYLENOL Take  650 mg by mouth in the morning and at bedtime.   amLODipine 5 MG tablet Commonly known as: NORVASC TAKE 1 TABLET(5 MG) BY MOUTH DAILY What changed:  how much to take how to take this when to take this additional instructions   apixaban 5 MG Tabs tablet Commonly known as: ELIQUIS Take 2 tablets (10 mg total) by mouth 2 (two) times daily for 7 days. 7 days from 11/23   apixaban 5 MG Tabs tablet Commonly known as: ELIQUIS Take 1 tablet (5 mg total) by mouth 2 (two) times daily. Start on 11/30 Start taking on: December 08, 2021   blood glucose meter kit and supplies Dispense based on patient and insurance preference. Pt just needs meter   cloNIDine 0.1 MG tablet Commonly known as: CATAPRES Take 0.1 mg by mouth 2 (two) times daily.   glipiZIDE 5 MG 24 hr tablet Commonly known as: GLUCOTROL XL Take 2 tabs in the morning and 1 tab in the evening. What changed:  how much to take how to take this when to take this   glucose blood test strip Commonly known as: ONE TOUCH ULTRA TEST Test blood sugar 3 times a day. Dx code: 250.00   OneTouch Ultra test strip Generic drug: glucose blood Use as instructed   insulin aspart 100 UNIT/ML injection Commonly known as: novoLOG Inject 3 Units into the skin 3 (three) times daily with meals.   insulin glargine-yfgn 100 UNIT/ML injection Commonly known as: SEMGLEE Inject 0.3 mLs (30 Units total) into the skin daily.   Insulin Pen Needle 32G X 4 MM Misc Commonly known as: BD Pen Needle Nano U/F USE TO INJECT INSULIN ONCE DAILY   INSULIN SYRINGE .5CC/31GX5/16" 31G X 5/16" 0.5 ML Misc Use to inject insulin 1 time daily.   Lancets Misc 1 each by Does not apply route 3 (three) times daily. Dx: E11.65   ONE TOUCH ULTRA MINI w/Device Kit Use to test blood sugar daily as instructed. Dx: E11.65   senna-docusate 8.6-50 MG tablet Commonly known as: Senokot-S Take 1 tablet by mouth at bedtime as needed for moderate constipation. What  changed:  how much to take when to take this   venlafaxine XR 37.5 MG 24 hr capsule Commonly known as: Effexor XR Take 1 capsule (37.5 mg total) by mouth daily with breakfast. Increase to 75 mg after 1 week What changed: how much to take        Allergies  Allergen Reactions   Ace Inhibitors Diarrhea, Swelling, Other (See Comments) and Cough    Pt had cough and diarrhea with first few doses of medication; also had swelling of right eyelid. Stopped medication on 03/17/11.   Alendronate Sodium Other (See Comments)    "  Made my whole body hurt"   Aspirin Hives   Crestor [Rosuvastatin] Other (See Comments)    "Made my whole body hurt"   Lipitor [Atorvastatin Calcium] Other (See Comments)    "Made my whole body hurt"   Metformin And Related     Severe abdominal pain   Shellfish Allergy Nausea Only    Intolerant of fresh shellfish, reports the reaction is GI upset, denies hives, denies any swelling  Reports that she can tolerate canned seafood.    Statins Other (See Comments)    myalgia     Other Procedures/Studies: VAS [place] LOWER EXTREMITY VENOUS (DVT)  Result Date: 12/02/2021  Lower Venous DVT Study Patient Name:  Wanda [name]  Date of Exam:   12/02/2021 Medical Rec #: [id number]            Accession #:    [id number] Date of Birth: [birth year]             Patient Gender: F Patient Age:   39 years Exam Location:  [facility] Procedure:      VAS [place] LOWER EXTREMITY VENOUS (DVT) Referring Phys: Oren Binet --------------------------------------------------------------------------------  Indications: DVT seen on CT.  Comparison Study: no prior Performing Technologist: Archie Patten RVS  Examination Guidelines: A complete evaluation includes B-mode imaging, spectral Doppler, color Doppler, and power Doppler as needed of all accessible portions of each vessel. Bilateral testing is considered an integral part of a complete examination. Limited examinations for reoccurring  indications may be performed as noted. The reflux portion of the exam is performed with the patient in reverse Trendelenburg.  +---------+---------------+---------+-----------+----------+--------------+ RIGHT    CompressibilityPhasicitySpontaneityPropertiesThrombus Aging +---------+---------------+---------+-----------+----------+--------------+ CFV      Full           Yes      Yes                                 +---------+---------------+---------+-----------+----------+--------------+ SFJ      Full                                                        +---------+---------------+---------+-----------+----------+--------------+ FV Prox  Full                                                        +---------+---------------+---------+-----------+----------+--------------+ FV Mid   Full                                                        +---------+---------------+---------+-----------+----------+--------------+ FV DistalFull                                                        +---------+---------------+---------+-----------+----------+--------------+ PFV      Full                                                        +---------+---------------+---------+-----------+----------+--------------+  POP      Full           Yes      Yes                                 +---------+---------------+---------+-----------+----------+--------------+ PTV      Full                                                        +---------+---------------+---------+-----------+----------+--------------+ PERO     Full           Yes      Yes                                 +---------+---------------+---------+-----------+----------+--------------+   +--------+---------------+---------+-----------+----------+--------------------+ LEFT    CompressibilityPhasicitySpontaneityPropertiesThrombus Aging        +--------+---------------+---------+-----------+----------+--------------------+ CFV     Partial        Yes      Yes                  Age Indeterminate    +--------+---------------+---------+-----------+----------+--------------------+ SFJ     None                                         Age Indeterminate    +--------+---------------+---------+-----------+----------+--------------------+ FV Prox Partial        Yes      Yes                  Age Indeterminate    +--------+---------------+---------+-----------+----------+--------------------+ FV Mid  Partial        Yes      Yes                  Age Indeterminate    +--------+---------------+---------+-----------+----------+--------------------+ FV      Partial        Yes      Yes                  Age Indeterminate    Distal                                                                    +--------+---------------+---------+-----------+----------+--------------------+ PFV     Full                                                              +--------+---------------+---------+-----------+----------+--------------------+ POP     Partial        Yes      Yes                  Age Indeterminate    +--------+---------------+---------+-----------+----------+--------------------+ PTV  Yes      Yes                  patent by color                                                           doppler              +--------+---------------+---------+-----------+----------+--------------------+ PERO                                                 Not well visualized  +--------+---------------+---------+-----------+----------+--------------------+ EIV                    Yes      Yes                  patent by color                                                           doppler              +--------+---------------+---------+-----------+----------+--------------------+     Summary: RIGHT: - There is no evidence of deep vein thrombosis in the lower extremity.  - No cystic structure found in the popliteal fossa.  LEFT: - Findings consistent with age indeterminate deep vein thrombosis involving the left common femoral vein, SF junction, left femoral vein, and left popliteal vein. - No cystic structure found in the popliteal fossa.  *See table(s) above for measurements and observations.    Preliminary    CT ABDOMEN PELVIS W CONTRAST  Result Date: 11/30/2021 CLINICAL DATA:  Blunt abdominal trauma. EXAM: CT ABDOMEN AND PELVIS WITH CONTRAST TECHNIQUE: Multidetector CT imaging of the abdomen and pelvis was performed using the standard protocol following bolus administration of intravenous contrast. RADIATION DOSE REDUCTION: This exam was performed according to the departmental dose-optimization program which includes automated exposure control, adjustment of the mA and/or kV according to patient size and/or use of iterative reconstruction technique. CONTRAST:  2m OMNIPAQUE IOHEXOL 350 MG/ML SOLN COMPARISON:  None Available. FINDINGS: Lower chest: The visualized lung bases are clear. There is 3 vessel coronary vascular calcification. There is calcification of the mitral annulus. No intra-abdominal free air or free fluid. Hepatobiliary: The liver is unremarkable. No biliary dilatation. The gallbladder is unremarkable. Pancreas: 2 small cystic lesions in the body of the pancreas measuring up to 15 mm are not characterized on this CT but likely represents a side branch IPMN. Further characterization with MRI without and with contrast on a nonemergent/outpatient basis recommended. No dilatation of the main pancreatic duct or gland atrophy. Spleen: Normal in size without focal abnormality. Adrenals/Urinary Tract: The adrenal glands are unremarkable. Small left renal interpolar cyst. No follow-up by imaging. There is no hydronephrosis on either side. There is symmetric enhancement and  excretion of contrast by both kidneys. The visualized ureters and urinary bladder appear unremarkable. Stomach/Bowel: There is no bowel obstruction or active inflammation. The appendix is normal. Vascular/Lymphatic:  Moderate aortoiliac atherosclerotic disease. There is DVT extending from the visualized left common femoral vein into the left common iliac vein. Faint hypodensity in the right common femoral vein may be artifactual and related to mixing of contrast or represent additional DVT. The IVC is unremarkable. There is mild compression of the left common iliac vein between the spine and right common iliac artery. No portal venous gas. There is no adenopathy. Reproductive: Hysterectomy.  No adnexal masses. Other: None Musculoskeletal: Osteopenia with degenerative changes of the spine. Grade 1 L4-L5 anterolisthesis. T11 hemangioma. No acute osseous pathology. IMPRESSION: 1. No acute intra-abdominal or pelvic pathology. 2. DVT extending from the visualized left common femoral vein into the left common iliac vein. 3. Small cystic lesions in the body of the pancreas as above, likely represents a side branch IPMN. Further characterization with MRI without and with contrast on a nonemergent/outpatient basis recommended. 4.  Aortic Atherosclerosis (ICD10-I70.0). These results were called by telephone at the time of interpretation on 11/30/2021 at 9:12 pm to provider [facility] , who verbally acknowledged these results. Electronically Signed   By: Anner Crete M.D.   On: 11/30/2021 21:13   DG Pelvis Portable  Result Date: 11/30/2021 CLINICAL DATA:  Level 2 trauma due to a fall. EXAM: PORTABLE PELVIS 1-2 VIEWS COMPARISON:  None Available. FINDINGS: Degenerative changes in the lower lumbar spine and hips. Pelvis appears intact. No acute displaced fractures identified. SI joints and symphysis pubis are nondisplaced. Visualized sacrum appears intact. IMPRESSION: No acute bony abnormalities. Electronically Signed    By: Lucienne Capers M.D.   On: 11/30/2021 19:43   DG Chest Portable 1 View  Result Date: 11/30/2021 CLINICAL DATA:  Level 2 trauma due to a fall. EXAM: PORTABLE CHEST 1 VIEW COMPARISON:  07/25/2021 FINDINGS: Shallow inspiration. Heart size and pulmonary vascularity are normal. Lungs are clear. No pleural effusions. No pneumothorax. Calcified aorta. Degenerative changes in the spine and shoulders. IMPRESSION: Shallow inspiration.  No evidence of active pulmonary disease. Electronically Signed   By: Lucienne Capers M.D.   On: 11/30/2021 19:43   CT Cervical Spine Wo Contrast  Result Date: 11/30/2021 CLINICAL DATA:  Neck trauma (Age >= 65y).  Fall. EXAM: CT CERVICAL SPINE WITHOUT CONTRAST TECHNIQUE: Multidetector CT imaging of the cervical spine was performed without intravenous contrast. Multiplanar CT image reconstructions were also generated. RADIATION DOSE REDUCTION: This exam was performed according to the departmental dose-optimization program which includes automated exposure control, adjustment of the mA and/or kV according to patient size and/or use of iterative reconstruction technique. COMPARISON:  None Available. FINDINGS: Alignment: Normal Skull base and vertebrae: No acute fracture. No primary bone lesion or focal pathologic process. Soft tissues and spinal canal: No prevertebral fluid or swelling. No visible canal hematoma. Disc levels: Diffuse moderate degenerative disc disease and facet disease. No disc herniation. Upper chest: No acute findings Other: Bilateral thyroid nodules, the largest in the right thyroid lobe appears to be solid and measures 3 cm. IMPRESSION: Degenerative disc and facet disease.  No acute bony abnormality. Multiple bilateral thyroid nodules, the largest 3 cm in the right thyroid lobe. Recommend non emergent thyroid US (ref: J Am Coll Radiol. 2015 Feb;12(2): 143-50). Electronically Signed   By: Rolm Baptise M.D.   On: 11/30/2021 19:37   CT HEAD WO CONTRAST  (5MM)  Result Date: 11/30/2021 CLINICAL DATA:  Head trauma, minor (Age >= 65y).  Fall EXAM: CT HEAD WITHOUT CONTRAST TECHNIQUE: Contiguous axial images were obtained from the base of the skull  through the vertex without intravenous contrast. RADIATION DOSE REDUCTION: This exam was performed according to the departmental dose-optimization program which includes automated exposure control, adjustment of the mA and/or kV according to patient size and/or use of iterative reconstruction technique. COMPARISON:  07/24/2021 FINDINGS: Brain: There is atrophy and chronic small vessel disease changes. 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 Old frontal parietal infarct. No acute intracranial abnormality. Specifically, no hemorrhage, hydrocephalus, mass lesion, acute infarction, or significant intracranial injury. Vascular: No hyperdense vessel or unexpected calcification. Skull: No acute calvarial abnormality. 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 Atrophy, chronic microvascular disease. No acute intracranial abnormality. Electronically Signed   By: Rolm Baptise M.D.   On: 11/30/2021 19:34     TODAY-DAY OF DISCHARGE:  Subjective:   Wanda [name] today has no headache,no chest abdominal pain,no new weakness tingling or numbness, feels much better wants to go home today.   Objective:   Blood pressure 113/62, pulse 92, temperature 97.7 F (36.5 C), temperature source Oral, resp. rate 18, height _0  (1.549 m), weight 67.6 kg, SpO2 93 %.  Intake/Output Summary (Last 24 hours) at 12/02/2021 1319 Last data filed at 12/02/2021 0453 Gross per 24 hour  Intake 120 ml  Output 100 ml  Net 20 ml   Filed Weights   11/30/21 1903 12/01/21 1525 12/02/21 0451  Weight: 75.8 kg 66.5 kg 67.6 kg    Exam: Awake Alert, Oriented *3, No new F.N deficits, Normal affect [place].AT,PERRAL Supple Neck,No JVD,  No cervical lymphadenopathy appriciated.  Symmetrical Chest wall movement, Good air movement bilaterally, CTAB RRR,No Gallops,Rubs or new Murmurs, No Parasternal Heave +ve B.Sounds, Abd Soft, Non tender, No organomegaly appriciated, No rebound -guarding or rigidity. No Cyanosis, Clubbing or edema, No new Rash or bruise   PERTINENT RADIOLOGIC STUDIES: VAS [place] LOWER EXTREMITY VENOUS (DVT)  Result Date: 12/02/2021  Lower Venous DVT Study Patient Name:  Wanda [name]  Date of Exam:   12/02/2021 Medical Rec #: [id number]            Accession #:    [id number] Date of Birth: 1940/02/20             Patient Gender: F Patient Age:   80 years Exam Location:  [name] Procedure:      VAS [place] LOWER EXTREMITY VENOUS (DVT) Referring Phys: Oren Binet --------------------------------------------------------------------------------  Indications: DVT seen on CT.  Comparison Study: no prior Performing Technologist: Archie Patten RVS  Examination Guidelines: A complete evaluation includes B-mode imaging, spectral Doppler, color Doppler, and power Doppler as needed of all accessible portions of each vessel. Bilateral testing is considered an integral part of a complete examination. Limited examinations for reoccurring indications may be performed as noted. The reflux portion of the exam is performed with the patient in reverse Trendelenburg.  +---------+---------------+---------+-----------+----------+--------------+ RIGHT    CompressibilityPhasicitySpontaneityPropertiesThrombus Aging +---------+---------------+---------+-----------+----------+--------------+ CFV      Full           Yes      Yes                                 +---------+---------------+---------+-----------+----------+--------------+ SFJ      Full                                                        +---------+---------------+---------+-----------+----------+--------------+  FV Prox  Full                                                         +---------+---------------+---------+-----------+----------+--------------+ FV Mid   Full                                                        +---------+---------------+---------+-----------+----------+--------------+ FV DistalFull                                                        +---------+---------------+---------+-----------+----------+--------------+ PFV      Full                                                        +---------+---------------+---------+-----------+----------+--------------+ POP      Full           Yes      Yes                                 +---------+---------------+---------+-----------+----------+--------------+ PTV      Full                                                        +---------+---------------+---------+-----------+----------+--------------+ PERO     Full           Yes      Yes                                 +---------+---------------+---------+-----------+----------+--------------+   +--------+---------------+---------+-----------+----------+--------------------+ LEFT    CompressibilityPhasicitySpontaneityPropertiesThrombus Aging       +--------+---------------+---------+-----------+----------+--------------------+ CFV     Partial        Yes      Yes                  Age Indeterminate    +--------+---------------+---------+-----------+----------+--------------------+ SFJ     None                                         Age Indeterminate    +--------+---------------+---------+-----------+----------+--------------------+ FV Prox Partial        Yes      Yes                  Age Indeterminate    +--------+---------------+---------+-----------+----------+--------------------+ FV Mid  Partial        Yes      Yes  Age Indeterminate    +--------+---------------+---------+-----------+----------+--------------------+ FV      Partial        Yes       Yes                  Age Indeterminate    Distal                                                                    +--------+---------------+---------+-----------+----------+--------------------+ PFV     Full                                                              +--------+---------------+---------+-----------+----------+--------------------+ POP     Partial        Yes      Yes                  Age Indeterminate    +--------+---------------+---------+-----------+----------+--------------------+ PTV                    Yes      Yes                  patent by color                                                           doppler              +--------+---------------+---------+-----------+----------+--------------------+ PERO                                                 Not well visualized  +--------+---------------+---------+-----------+----------+--------------------+ EIV                    Yes      Yes                  patent by color                                                           doppler              +--------+---------------+---------+-----------+----------+--------------------+    Summary: RIGHT: - There is no evidence of deep vein thrombosis in the lower extremity.  - No cystic structure found in the popliteal fossa.  LEFT: - Findings consistent with age indeterminate deep vein thrombosis involving the left common femoral vein, SF junction, left femoral vein, and left popliteal vein. - No cystic structure found in the popliteal fossa.  *See table(s) above for measurements and observations.    Preliminary  CT ABDOMEN PELVIS W CONTRAST  Result Date: 11/30/2021 CLINICAL DATA:  Blunt abdominal trauma. EXAM: CT ABDOMEN AND PELVIS WITH CONTRAST TECHNIQUE: Multidetector CT imaging of the abdomen and pelvis was performed using the standard protocol following bolus administration of intravenous contrast. RADIATION DOSE REDUCTION:  This exam was performed according to the departmental dose-optimization program which includes automated exposure control, adjustment of the mA and/or kV according to patient size and/or use of iterative reconstruction technique. CONTRAST:  26m OMNIPAQUE IOHEXOL 350 MG/ML SOLN COMPARISON:  None Available. FINDINGS: Lower chest: The visualized lung bases are clear. There is 3 vessel coronary vascular calcification. There is calcification of the mitral annulus. No intra-abdominal free air or free fluid. Hepatobiliary: The liver is unremarkable. No biliary dilatation. The gallbladder is unremarkable. Pancreas: 2 small cystic lesions in the body of the pancreas measuring up to 15 mm are not characterized on this CT but likely represents a side branch IPMN. Further characterization with MRI without and with contrast on a nonemergent/outpatient basis recommended. No dilatation of the main pancreatic duct or gland atrophy. Spleen: Normal in size without focal abnormality. Adrenals/Urinary Tract: The adrenal glands are unremarkable. Small left renal interpolar cyst. No follow-up by imaging. There is no hydronephrosis on either side. There is symmetric enhancement and excretion of contrast by both kidneys. The visualized ureters and urinary bladder appear unremarkable. Stomach/Bowel: There is no bowel obstruction or active inflammation. The appendix is normal. Vascular/Lymphatic: Moderate aortoiliac atherosclerotic disease. There is DVT extending from the visualized left common femoral vein into the left common iliac vein. Faint hypodensity in the right common femoral vein may be artifactual and related to mixing of contrast or represent additional DVT. The IVC is unremarkable. There is mild compression of the left common iliac vein between the spine and right common iliac artery. No portal venous gas. There is no adenopathy. Reproductive: Hysterectomy.  No adnexal masses. Other: None Musculoskeletal: Osteopenia with  degenerative changes of the spine. Grade 1 L4-L5 anterolisthesis. T11 hemangioma. No acute osseous pathology. IMPRESSION: 1. No acute intra-abdominal or pelvic pathology. 2. DVT extending from the visualized left common femoral vein into the left common iliac vein. 3. Small cystic lesions in the body of the pancreas as above, likely represents a side branch IPMN. Further characterization with MRI without and with contrast on a nonemergent/outpatient basis recommended. 4.  Aortic Atherosclerosis (ICD10-I70.0). These results were called by telephone at the time of interpretation on 11/30/2021 at 9:12 pm to [facility], who verbally acknowledged these results. Electronically Signed   By: AAnner CreteM.D.   On: 11/30/2021 21:13   DG Pelvis Portable  Result Date: 11/30/2021 CLINICAL DATA:  Level 2 trauma due to a fall. EXAM: PORTABLE PELVIS 1-2 VIEWS COMPARISON:  None Available. FINDINGS: Degenerative changes in the lower lumbar spine and hips. Pelvis appears intact. No acute displaced fractures identified. SI joints and symphysis pubis are nondisplaced. Visualized sacrum appears intact. IMPRESSION: No acute bony abnormalities. Electronically Signed   By: WLucienne CapersM.D.   On: 11/30/2021 19:43   DG Chest Portable 1 View  Result Date: 11/30/2021 CLINICAL DATA:  Level 2 trauma due to a fall. EXAM: PORTABLE CHEST 1 VIEW COMPARISON:  07/25/2021 FINDINGS: Shallow inspiration. Heart size and pulmonary vascularity are normal. Lungs are clear. No pleural effusions. No pneumothorax. Calcified aorta. Degenerative changes in the spine and shoulders. IMPRESSION: Shallow inspiration.  No evidence of active pulmonary disease. Electronically Signed   By: WLucienne CapersM.D.   On:  11/30/2021 19:43   CT Cervical Spine Wo Contrast  Result Date: 11/30/2021 CLINICAL DATA:  Neck trauma (Age >= 65y).  Fall. EXAM: CT CERVICAL SPINE WITHOUT CONTRAST TECHNIQUE: Multidetector CT imaging of the cervical  spine was performed without intravenous contrast. Multiplanar CT image reconstructions were also generated. RADIATION DOSE REDUCTION: This exam was performed according to the departmental dose-optimization program which includes automated exposure control, adjustment of the mA and/or kV according to patient size and/or use of iterative reconstruction technique. COMPARISON:  None Available. FINDINGS: Alignment: Normal Skull base and vertebrae: No acute fracture. No primary bone lesion or focal pathologic process. Soft tissues and spinal canal: No prevertebral fluid or swelling. No visible canal hematoma. Disc levels: Diffuse moderate degenerative disc disease and facet disease. No disc herniation. Upper chest: No acute findings Other: Bilateral thyroid nodules, the largest in the right thyroid lobe appears to be solid and measures 3 cm. IMPRESSION: Degenerative disc and facet disease.  No acute bony abnormality. Multiple bilateral thyroid nodules, the largest 3 cm in the right thyroid lobe. Recommend non emergent thyroid US (ref: J Am Coll Radiol. 2015 Feb;12(2): 143-50). Electronically Signed   By: Rolm Baptise M.D.   On: 11/30/2021 19:37   CT HEAD WO CONTRAST (5MM)  Result Date: 11/30/2021 CLINICAL DATA:  Head trauma, minor (Age >= 65y).  Fall EXAM: CT HEAD WITHOUT CONTRAST TECHNIQUE: Contiguous axial images were obtained from the base of the skull through the vertex without intravenous contrast. RADIATION DOSE REDUCTION: This exam was performed according to the departmental dose-optimization program which includes automated exposure control, adjustment of the mA and/or kV according to patient size and/or use of iterative reconstruction technique. COMPARISON:  07/24/2021 FINDINGS: Brain: There is atrophy and chronic small vessel disease changes. 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 Old frontal parietal infarct. No acute intracranial abnormality. Specifically, no  hemorrhage, hydrocephalus, mass lesion, acute infarction, or significant intracranial injury. Vascular: No hyperdense vessel or unexpected calcification. Skull: No acute calvarial abnormality. 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 Atrophy, chronic microvascular disease. No acute intracranial abnormality. Electronically Signed   By: Rolm Baptise M.D.   On: 11/30/2021 19:34     PERTINENT LAB RESULTS: CBC: Recent Labs    11/30/21 1906 12/01/21 0349  WBC 7.4 6.9  HGB 13.5 13.6  HCT 39.9 39.5  PLT 236 225   CMET CMP     Component Value Date/Time   NA 138 12/01/2021 0653   K 4.1 12/01/2021 0653   CL 105 12/01/2021 0653   CO2 23 12/01/2021 0653   GLUCOSE 303 (H) 12/01/2021 0653   BUN 6 (L) 12/01/2021 0653   CREATININE 0.71 12/01/2021 0653   CREATININE 1.09 (H) 09/25/2019 1405   CALCIUM 9.2 12/01/2021 0653   PROT 6.3 (L) 12/01/2021 0653   ALBUMIN 3.4 (L) 12/01/2021 0653   AST 54 (H) 12/01/2021 0653   ALT 71 (H) 12/01/2021 0653   ALKPHOS 80 12/01/2021 0653   BILITOT 1.2 12/01/2021 0653   GFRNONAA >60 12/01/2021 0653   GFRAA >60 11/14/2018 0545    GFR Estimated Creatinine Clearance: 48.5 mL/min (by C-G formula based on SCr of 0.71 mg/dL). No results for input(s): "LIPASE", "AMYLASE" in the last 72 hours. No results for input(s): "CKTOTAL", "CKMB", "CKMBINDEX", "TROPONINI" in the last 72 hours. Invalid input(s): "POCBNP" No results for input(s): "DDIMER" in the last 72 hours. No results for input(s): "HGBA1C" in the last 72 hours. No  results for input(s): "CHOL", "HDL", "LDLCALC", "TRIG", "CHOLHDL", "LDLDIRECT" in the last 72 hours. Recent Labs    12/01/21 0653  TSH 1.041   No results for input(s): "VITAMINB12", "FOLATE", "FERRITIN", "TIBC", "IRON", "RETICCTPCT" in the last 72 hours. Coags: No results for input(s): "INR" in the last 72 hours.  Invalid input(s): "PT" Microbiology: No results  found for this or any previous visit (from the past 240 hour(s)).  FURTHER DISCHARGE INSTRUCTIONS:  Get Medicines reviewed and adjusted: Please take all your medications with you for your next visit with your Primary MD  Laboratory/radiological data: Please request your Primary MD to go over all hospital tests and procedure/radiological results at the follow up, please ask your Primary MD to get all Hospital records sent to his/her office.  In some cases, they will be blood work, cultures and biopsy results pending at the time of your discharge. Please request that your primary care M.D. goes through all the records of your hospital data and follows up on these results.  Also Note the following: If you experience worsening of your admission symptoms, develop shortness of breath, life threatening emergency, suicidal or homicidal thoughts you must seek medical attention immediately by calling 911 or calling your MD immediately  if symptoms less severe.  You must read complete instructions/literature along with all the possible adverse reactions/side effects for all the Medicines you take and that have been prescribed to you. Take any new Medicines after you have completely understood and accpet all the possible adverse reactions/side effects.   Do not drive when taking Pain medications or sleeping medications (Benzodaizepines)  Do not take more than prescribed Pain, Sleep and Anxiety Medications. It is not advisable to combine anxiety,sleep and pain medications without talking with your primary care practitioner  Special Instructions: If you have smoked or chewed Tobacco  in the last 2 yrs please stop smoking, stop any regular Alcohol  and or any Recreational drug use.  Wear Seat belts while driving.  Please note: You were cared for by a hospitalist during your hospital stay. Once you are discharged, your primary care physician will handle any further medical issues. Please note that NO  REFILLS for any discharge medications will be authorized once you are discharged, as it is imperative that you return to your primary care physician (or establish a relationship with a primary care physician if you do not have one) for your post hospital discharge needs so that they can reassess your need for medications and monitor your lab values.  Total Time spent coordinating discharge including counseling, education and face to face time equals greater than 30 minutes.  Signed: Jacorion Klem 12/02/2021 1:19 PM

## 2021-12-02 NOTE — TOC Transition Note (Signed)
Transition of Care [facility]) - CM/SW Discharge Note   Patient Details  Name: Wanda [name] MRN: [id number] Date of Birth: 1940-06-23  [facility] CM/SW Contact:  Bethann Berkshire, [place] Phone Number: 12/02/2021, 2:06 PM   Clinical Narrative:     CSW informed pt from SNF. Called pt's daughter to confirm; she stated pt actually from [facility]. Current therapy recs are for SNF. CSW contacted [facility]; Nurse wasn't in though med tech was familiar with pt and stated she previously needed minimal assistance transferring. Pt currently needing max assist +2  Spoke with pt's daughter regarding SNF vs ALF. Daughter states she is with pt in room and that pt is not much different from her baseline. Daughter is interested in pt returning to ALF and received HH therapies there.   CSW spoke with RN at [facility] and informed them that pt currently max assist +2 for transfers. They stated pt can return today and is active with [facility] OT. They will add PT services as well. They request DC summary and Fl2 be faxed to [phone].      Final next level of care: Assisted Living Barriers to Discharge: No Barriers Identified   Patient Goals and CMS Choice        Discharge Placement              Patient chooses bed at:  [facility] ALF) Patient to be transferred to facility by: [place] Name of family member notified: Daughter Wanda [name] Patient and family notified of of transfer: 12/02/21  Discharge Plan and Services                                     Social Determinants of Health (SDOH) Interventions     Readmission Risk Interventions     No data to display

## 2021-12-02 NOTE — Progress Notes (Signed)
Lower extremity venous duplex has been completed.   Preliminary results in CV Proc.  Results given to RN.   [name] Renny Gunnarson 12/02/2021 11:54 AM

## 2021-12-06 ENCOUNTER — Telehealth: Payer: Self-pay

## 2021-12-06 DIAGNOSIS — I10 Essential (primary) hypertension: Secondary | ICD-10-CM | POA: Diagnosis not present

## 2021-12-06 DIAGNOSIS — E119 Type 2 diabetes mellitus without complications: Secondary | ICD-10-CM | POA: Diagnosis not present

## 2021-12-06 DIAGNOSIS — I69391 Dysphagia following cerebral infarction: Secondary | ICD-10-CM | POA: Diagnosis not present

## 2021-12-06 DIAGNOSIS — M1990 Unspecified osteoarthritis, unspecified site: Secondary | ICD-10-CM | POA: Diagnosis not present

## 2021-12-06 DIAGNOSIS — I69322 Dysarthria following cerebral infarction: Secondary | ICD-10-CM | POA: Diagnosis not present

## 2021-12-06 DIAGNOSIS — I69354 Hemiplegia and hemiparesis following cerebral infarction affecting left non-dominant side: Secondary | ICD-10-CM | POA: Diagnosis not present

## 2021-12-06 NOTE — Telephone Encounter (Signed)
Transition Care Management Unsuccessful Follow-up Telephone Call  Date of discharge and from where:  12/02/2021, [facility]  Attempts:  1st Attempt  Reason for unsuccessful TCM follow-up call:  No answer/busy

## 2021-12-09 DIAGNOSIS — E119 Type 2 diabetes mellitus without complications: Secondary | ICD-10-CM | POA: Diagnosis not present

## 2021-12-09 DIAGNOSIS — I69391 Dysphagia following cerebral infarction: Secondary | ICD-10-CM | POA: Diagnosis not present

## 2021-12-09 DIAGNOSIS — I10 Essential (primary) hypertension: Secondary | ICD-10-CM | POA: Diagnosis not present

## 2021-12-09 DIAGNOSIS — M1990 Unspecified osteoarthritis, unspecified site: Secondary | ICD-10-CM | POA: Diagnosis not present

## 2021-12-09 DIAGNOSIS — I69322 Dysarthria following cerebral infarction: Secondary | ICD-10-CM | POA: Diagnosis not present

## 2021-12-09 DIAGNOSIS — I69354 Hemiplegia and hemiparesis following cerebral infarction affecting left non-dominant side: Secondary | ICD-10-CM | POA: Diagnosis not present

## 2021-12-09 NOTE — Telephone Encounter (Signed)
Transition Care Management Unsuccessful Follow-up Telephone Call  Date of discharge and from where:  12/02/2021, [facility]  Attempts:  2nd Attempt  Reason for unsuccessful TCM follow-up call:  No answer/busy

## 2021-12-15 DIAGNOSIS — I69391 Dysphagia following cerebral infarction: Secondary | ICD-10-CM | POA: Diagnosis not present

## 2021-12-15 DIAGNOSIS — M1990 Unspecified osteoarthritis, unspecified site: Secondary | ICD-10-CM | POA: Diagnosis not present

## 2021-12-15 DIAGNOSIS — I69354 Hemiplegia and hemiparesis following cerebral infarction affecting left non-dominant side: Secondary | ICD-10-CM | POA: Diagnosis not present

## 2021-12-15 DIAGNOSIS — I10 Essential (primary) hypertension: Secondary | ICD-10-CM | POA: Diagnosis not present

## 2021-12-15 DIAGNOSIS — I69322 Dysarthria following cerebral infarction: Secondary | ICD-10-CM | POA: Diagnosis not present

## 2021-12-15 DIAGNOSIS — E119 Type 2 diabetes mellitus without complications: Secondary | ICD-10-CM | POA: Diagnosis not present

## 2021-12-16 ENCOUNTER — Telehealth: Payer: Self-pay | Admitting: Family Medicine

## 2021-12-16 DIAGNOSIS — I10 Essential (primary) hypertension: Secondary | ICD-10-CM | POA: Diagnosis not present

## 2021-12-16 DIAGNOSIS — E119 Type 2 diabetes mellitus without complications: Secondary | ICD-10-CM | POA: Diagnosis not present

## 2021-12-16 DIAGNOSIS — I69354 Hemiplegia and hemiparesis following cerebral infarction affecting left non-dominant side: Secondary | ICD-10-CM | POA: Diagnosis not present

## 2021-12-16 DIAGNOSIS — M1990 Unspecified osteoarthritis, unspecified site: Secondary | ICD-10-CM | POA: Diagnosis not present

## 2021-12-16 DIAGNOSIS — I69322 Dysarthria following cerebral infarction: Secondary | ICD-10-CM | POA: Diagnosis not present

## 2021-12-16 DIAGNOSIS — I69391 Dysphagia following cerebral infarction: Secondary | ICD-10-CM | POA: Diagnosis not present

## 2021-12-16 NOTE — Telephone Encounter (Signed)
Caller/Agency: Nada Libman [place] Number: [phone] Requesting OT/PT/Skilled Nursing/Social Work/Speech Therapy: Speech Therapy -memory strategies to increase safety  Frequency: 1x1

## 2021-12-19 NOTE — Telephone Encounter (Signed)
Verbals left on vm.

## 2021-12-20 DIAGNOSIS — I69354 Hemiplegia and hemiparesis following cerebral infarction affecting left non-dominant side: Secondary | ICD-10-CM | POA: Diagnosis not present

## 2021-12-20 DIAGNOSIS — E119 Type 2 diabetes mellitus without complications: Secondary | ICD-10-CM | POA: Diagnosis not present

## 2021-12-20 DIAGNOSIS — I10 Essential (primary) hypertension: Secondary | ICD-10-CM | POA: Diagnosis not present

## 2021-12-20 DIAGNOSIS — E785 Hyperlipidemia, unspecified: Secondary | ICD-10-CM | POA: Diagnosis not present

## 2021-12-20 DIAGNOSIS — K5901 Slow transit constipation: Secondary | ICD-10-CM | POA: Diagnosis not present

## 2021-12-20 DIAGNOSIS — I69322 Dysarthria following cerebral infarction: Secondary | ICD-10-CM | POA: Diagnosis not present

## 2021-12-20 DIAGNOSIS — M1990 Unspecified osteoarthritis, unspecified site: Secondary | ICD-10-CM | POA: Diagnosis not present

## 2021-12-20 DIAGNOSIS — I69391 Dysphagia following cerebral infarction: Secondary | ICD-10-CM | POA: Diagnosis not present

## 2021-12-21 ENCOUNTER — Telehealth: Payer: Self-pay | Admitting: Family Medicine

## 2021-12-21 DIAGNOSIS — F01C3 Vascular dementia, severe, with mood disturbance: Secondary | ICD-10-CM | POA: Diagnosis not present

## 2021-12-21 DIAGNOSIS — E119 Type 2 diabetes mellitus without complications: Secondary | ICD-10-CM | POA: Diagnosis not present

## 2021-12-21 DIAGNOSIS — I10 Essential (primary) hypertension: Secondary | ICD-10-CM | POA: Diagnosis not present

## 2021-12-21 DIAGNOSIS — I69391 Dysphagia following cerebral infarction: Secondary | ICD-10-CM | POA: Diagnosis not present

## 2021-12-21 DIAGNOSIS — I69322 Dysarthria following cerebral infarction: Secondary | ICD-10-CM | POA: Diagnosis not present

## 2021-12-21 DIAGNOSIS — M1990 Unspecified osteoarthritis, unspecified site: Secondary | ICD-10-CM | POA: Diagnosis not present

## 2021-12-21 DIAGNOSIS — Z9181 History of falling: Secondary | ICD-10-CM | POA: Diagnosis not present

## 2021-12-21 DIAGNOSIS — K5904 Chronic idiopathic constipation: Secondary | ICD-10-CM | POA: Diagnosis not present

## 2021-12-21 DIAGNOSIS — I69354 Hemiplegia and hemiparesis following cerebral infarction affecting left non-dominant side: Secondary | ICD-10-CM | POA: Diagnosis not present

## 2021-12-21 NOTE — Telephone Encounter (Signed)
Verbals were given to [facility] for the UA, Culture and blood sugar checks.

## 2021-12-21 NOTE — Telephone Encounter (Signed)
Hinton Dyer, RN with [facility], called to request a UA and culture for possible UTI and an order for blood sugar check. Please call [phone] to advise.

## 2021-12-22 DIAGNOSIS — I69354 Hemiplegia and hemiparesis following cerebral infarction affecting left non-dominant side: Secondary | ICD-10-CM | POA: Diagnosis not present

## 2021-12-22 DIAGNOSIS — I69322 Dysarthria following cerebral infarction: Secondary | ICD-10-CM | POA: Diagnosis not present

## 2021-12-22 DIAGNOSIS — M1990 Unspecified osteoarthritis, unspecified site: Secondary | ICD-10-CM | POA: Diagnosis not present

## 2021-12-22 DIAGNOSIS — I10 Essential (primary) hypertension: Secondary | ICD-10-CM | POA: Diagnosis not present

## 2021-12-22 DIAGNOSIS — I69391 Dysphagia following cerebral infarction: Secondary | ICD-10-CM | POA: Diagnosis not present

## 2021-12-22 DIAGNOSIS — E119 Type 2 diabetes mellitus without complications: Secondary | ICD-10-CM | POA: Diagnosis not present

## 2021-12-23 DIAGNOSIS — I10 Essential (primary) hypertension: Secondary | ICD-10-CM | POA: Diagnosis not present

## 2021-12-23 DIAGNOSIS — I69354 Hemiplegia and hemiparesis following cerebral infarction affecting left non-dominant side: Secondary | ICD-10-CM | POA: Diagnosis not present

## 2021-12-23 DIAGNOSIS — E119 Type 2 diabetes mellitus without complications: Secondary | ICD-10-CM | POA: Diagnosis not present

## 2021-12-23 DIAGNOSIS — I69391 Dysphagia following cerebral infarction: Secondary | ICD-10-CM | POA: Diagnosis not present

## 2021-12-23 DIAGNOSIS — I69322 Dysarthria following cerebral infarction: Secondary | ICD-10-CM | POA: Diagnosis not present

## 2021-12-23 DIAGNOSIS — M1990 Unspecified osteoarthritis, unspecified site: Secondary | ICD-10-CM | POA: Diagnosis not present

## 2021-12-26 ENCOUNTER — Inpatient Hospital Stay (HOSPITAL_COMMUNITY): Payer: Medicare Other

## 2021-12-26 ENCOUNTER — Telehealth: Payer: Self-pay | Admitting: Family Medicine

## 2021-12-26 ENCOUNTER — Emergency Department (HOSPITAL_COMMUNITY): Payer: Medicare Other

## 2021-12-26 ENCOUNTER — Other Ambulatory Visit: Payer: Self-pay

## 2021-12-26 ENCOUNTER — Inpatient Hospital Stay (HOSPITAL_COMMUNITY)
Admission: EM | Admit: 2021-12-26 | Discharge: 2022-01-04 | DRG: 064 | Disposition: A | Discharge status: Skilled Nursing Facility | Payer: Medicare Other | Source: Skilled Nursing Facility | Attending: Internal Medicine | Admitting: Internal Medicine

## 2021-12-26 ENCOUNTER — Encounter (HOSPITAL_COMMUNITY): Payer: Self-pay

## 2021-12-26 DIAGNOSIS — I63031 Cerebral infarction due to thrombosis of right carotid artery: Secondary | ICD-10-CM | POA: Diagnosis not present

## 2021-12-26 DIAGNOSIS — D72829 Elevated white blood cell count, unspecified: Secondary | ICD-10-CM | POA: Diagnosis not present

## 2021-12-26 DIAGNOSIS — G9341 Metabolic encephalopathy: Secondary | ICD-10-CM | POA: Diagnosis present

## 2021-12-26 DIAGNOSIS — Z803 Family history of malignant neoplasm of breast: Secondary | ICD-10-CM | POA: Diagnosis not present

## 2021-12-26 DIAGNOSIS — Z8673 Personal history of transient ischemic attack (TIA), and cerebral infarction without residual deficits: Secondary | ICD-10-CM

## 2021-12-26 DIAGNOSIS — E119 Type 2 diabetes mellitus without complications: Secondary | ICD-10-CM

## 2021-12-26 DIAGNOSIS — I82409 Acute embolism and thrombosis of unspecified deep veins of unspecified lower extremity: Secondary | ICD-10-CM | POA: Diagnosis present

## 2021-12-26 DIAGNOSIS — R414 Neurologic neglect syndrome: Secondary | ICD-10-CM | POA: Diagnosis present

## 2021-12-26 DIAGNOSIS — E663 Overweight: Secondary | ICD-10-CM | POA: Diagnosis present

## 2021-12-26 DIAGNOSIS — Z96653 Presence of artificial knee joint, bilateral: Secondary | ICD-10-CM | POA: Diagnosis present

## 2021-12-26 DIAGNOSIS — Z86711 Personal history of pulmonary embolism: Secondary | ICD-10-CM

## 2021-12-26 DIAGNOSIS — E43 Unspecified severe protein-calorie malnutrition: Secondary | ICD-10-CM | POA: Insufficient documentation

## 2021-12-26 DIAGNOSIS — R1310 Dysphagia, unspecified: Secondary | ICD-10-CM | POA: Diagnosis not present

## 2021-12-26 DIAGNOSIS — Z85828 Personal history of other malignant neoplasm of skin: Secondary | ICD-10-CM

## 2021-12-26 DIAGNOSIS — R29810 Facial weakness: Secondary | ICD-10-CM | POA: Diagnosis present

## 2021-12-26 DIAGNOSIS — R471 Dysarthria and anarthria: Secondary | ICD-10-CM | POA: Diagnosis present

## 2021-12-26 DIAGNOSIS — Z888 Allergy status to other drugs, medicaments and biological substances status: Secondary | ICD-10-CM

## 2021-12-26 DIAGNOSIS — E785 Hyperlipidemia, unspecified: Secondary | ICD-10-CM | POA: Diagnosis present

## 2021-12-26 DIAGNOSIS — I63231 Cerebral infarction due to unspecified occlusion or stenosis of right carotid arteries: Secondary | ICD-10-CM | POA: Diagnosis not present

## 2021-12-26 DIAGNOSIS — Z794 Long term (current) use of insulin: Secondary | ICD-10-CM

## 2021-12-26 DIAGNOSIS — R1111 Vomiting without nausea: Secondary | ICD-10-CM | POA: Diagnosis not present

## 2021-12-26 DIAGNOSIS — Z6826 Body mass index (BMI) 26.0-26.9, adult: Secondary | ICD-10-CM

## 2021-12-26 DIAGNOSIS — Z886 Allergy status to analgesic agent status: Secondary | ICD-10-CM

## 2021-12-26 DIAGNOSIS — F32A Depression, unspecified: Secondary | ICD-10-CM | POA: Diagnosis present

## 2021-12-26 DIAGNOSIS — I10 Essential (primary) hypertension: Secondary | ICD-10-CM | POA: Diagnosis not present

## 2021-12-26 DIAGNOSIS — Z91013 Allergy to seafood: Secondary | ICD-10-CM

## 2021-12-26 DIAGNOSIS — R112 Nausea with vomiting, unspecified: Secondary | ICD-10-CM | POA: Diagnosis not present

## 2021-12-26 DIAGNOSIS — E042 Nontoxic multinodular goiter: Secondary | ICD-10-CM | POA: Diagnosis present

## 2021-12-26 DIAGNOSIS — I63321 Cerebral infarction due to thrombosis of right anterior cerebral artery: Secondary | ICD-10-CM | POA: Diagnosis not present

## 2021-12-26 DIAGNOSIS — E876 Hypokalemia: Secondary | ICD-10-CM | POA: Diagnosis not present

## 2021-12-26 DIAGNOSIS — G8194 Hemiplegia, unspecified affecting left nondominant side: Secondary | ICD-10-CM | POA: Diagnosis present

## 2021-12-26 DIAGNOSIS — Z7984 Long term (current) use of oral hypoglycemic drugs: Secondary | ICD-10-CM

## 2021-12-26 DIAGNOSIS — Z8249 Family history of ischemic heart disease and other diseases of the circulatory system: Secondary | ICD-10-CM

## 2021-12-26 DIAGNOSIS — I69354 Hemiplegia and hemiparesis following cerebral infarction affecting left non-dominant side: Secondary | ICD-10-CM | POA: Diagnosis not present

## 2021-12-26 DIAGNOSIS — F419 Anxiety disorder, unspecified: Secondary | ICD-10-CM | POA: Diagnosis present

## 2021-12-26 DIAGNOSIS — Z86718 Personal history of other venous thrombosis and embolism: Secondary | ICD-10-CM

## 2021-12-26 DIAGNOSIS — L89322 Pressure ulcer of left buttock, stage 2: Secondary | ICD-10-CM | POA: Diagnosis present

## 2021-12-26 DIAGNOSIS — I6521 Occlusion and stenosis of right carotid artery: Secondary | ICD-10-CM | POA: Diagnosis not present

## 2021-12-26 DIAGNOSIS — I672 Cerebral atherosclerosis: Secondary | ICD-10-CM | POA: Diagnosis not present

## 2021-12-26 DIAGNOSIS — I639 Cerebral infarction, unspecified: Secondary | ICD-10-CM | POA: Diagnosis not present

## 2021-12-26 DIAGNOSIS — I63511 Cerebral infarction due to unspecified occlusion or stenosis of right middle cerebral artery: Principal | ICD-10-CM | POA: Diagnosis present

## 2021-12-26 DIAGNOSIS — I824Y9 Acute embolism and thrombosis of unspecified deep veins of unspecified proximal lower extremity: Secondary | ICD-10-CM

## 2021-12-26 DIAGNOSIS — I6389 Other cerebral infarction: Secondary | ICD-10-CM | POA: Diagnosis not present

## 2021-12-26 DIAGNOSIS — Z751 Person awaiting admission to adequate facility elsewhere: Secondary | ICD-10-CM | POA: Diagnosis not present

## 2021-12-26 DIAGNOSIS — R531 Weakness: Secondary | ICD-10-CM | POA: Diagnosis not present

## 2021-12-26 DIAGNOSIS — Z66 Do not resuscitate: Secondary | ICD-10-CM | POA: Diagnosis present

## 2021-12-26 DIAGNOSIS — I63311 Cerebral infarction due to thrombosis of right middle cerebral artery: Secondary | ICD-10-CM | POA: Diagnosis not present

## 2021-12-26 DIAGNOSIS — Z7401 Bed confinement status: Secondary | ICD-10-CM | POA: Diagnosis not present

## 2021-12-26 DIAGNOSIS — E1165 Type 2 diabetes mellitus with hyperglycemia: Secondary | ICD-10-CM | POA: Diagnosis not present

## 2021-12-26 DIAGNOSIS — Z79899 Other long term (current) drug therapy: Secondary | ICD-10-CM

## 2021-12-26 DIAGNOSIS — Z7901 Long term (current) use of anticoagulants: Secondary | ICD-10-CM

## 2021-12-26 DIAGNOSIS — Z4659 Encounter for fitting and adjustment of other gastrointestinal appliance and device: Secondary | ICD-10-CM | POA: Diagnosis not present

## 2021-12-26 DIAGNOSIS — R000 Tachycardia, unspecified: Secondary | ICD-10-CM | POA: Diagnosis not present

## 2021-12-26 LAB — COMPREHENSIVE METABOLIC PANEL
ALT: 13 U/L (ref 0–44)
AST: 20 U/L (ref 15–41)
Albumin: 4 g/dL (ref 3.5–5.0)
Alkaline Phosphatase: 76 U/L (ref 38–126)
Anion gap: 14 (ref 5–15)
BUN: 14 mg/dL (ref 8–23)
CO2: 21 mmol/L — ABNORMAL LOW (ref 22–32)
Calcium: 10.1 mg/dL (ref 8.9–10.3)
Chloride: 102 mmol/L (ref 98–111)
Creatinine, Ser: 0.78 mg/dL (ref 0.44–1.00)
GFR, Estimated: >60 mL/min (ref >60)
Glucose, Bld: 113 mg/dL — ABNORMAL HIGH (ref 70–99)
Potassium: 3.5 mmol/L (ref 3.5–5.1)
Sodium: 137 mmol/L (ref 135–145)
Total Bilirubin: 0.9 mg/dL (ref 0.3–1.2)
Total Protein: 7.8 g/dL (ref 6.5–8.1)

## 2021-12-26 LAB — I-STAT CHEM 8, ED
BUN: 20 mg/dL (ref 8–23)
Calcium, Ion: 1.09 mmol/L — ABNORMAL LOW (ref 1.15–1.40)
Chloride: 105 mmol/L (ref 98–111)
Creatinine, Ser: 0.6 mg/dL (ref 0.44–1.00)
Glucose, Bld: 110 mg/dL — ABNORMAL HIGH (ref 70–99)
HCT: 43 % (ref 36.0–46.0)
Hemoglobin: 14.6 g/dL (ref 12.0–15.0)
Potassium: 4.7 mmol/L (ref 3.5–5.1)
Sodium: 138 mmol/L (ref 135–145)
TCO2: 26 mmol/L (ref 22–32)

## 2021-12-26 LAB — DIFFERENTIAL
Abs Immature Granulocytes: 0.06 10*3/uL (ref 0.00–0.07)
Basophils Absolute: 0 10*3/uL (ref 0.0–0.1)
Basophils Relative: 0 %
Eosinophils Absolute: 0 10*3/uL (ref 0.0–0.5)
Eosinophils Relative: 0 %
Immature Granulocytes: 1 %
Lymphocytes Relative: 10 %
Lymphs Abs: 1.2 10*3/uL (ref 0.7–4.0)
Monocytes Absolute: 1.1 10*3/uL — ABNORMAL HIGH (ref 0.1–1.0)
Monocytes Relative: 8 %
Neutro Abs: 10.3 10*3/uL — ABNORMAL HIGH (ref 1.7–7.7)
Neutrophils Relative %: 81 %

## 2021-12-26 LAB — APTT: aPTT: 29 seconds (ref 24–36)

## 2021-12-26 LAB — CBC
HCT: 46.2 % — ABNORMAL HIGH (ref 36.0–46.0)
Hemoglobin: 14.7 g/dL (ref 12.0–15.0)
MCH: 30.1 pg (ref 26.0–34.0)
MCHC: 31.8 g/dL (ref 30.0–36.0)
MCV: 94.7 fL (ref 80.0–100.0)
Platelets: 274 10*3/uL (ref 150–400)
RBC: 4.88 MIL/uL (ref 3.87–5.11)
RDW: 13.7 % (ref 11.5–15.5)
WBC: 12.8 10*3/uL — ABNORMAL HIGH (ref 4.0–10.5)
nRBC: 0 % (ref 0.0–0.2)

## 2021-12-26 LAB — PROTIME-INR
INR: 1.3 — ABNORMAL HIGH (ref 0.8–1.2)
Prothrombin Time: 15.8 seconds — ABNORMAL HIGH (ref 11.4–15.2)

## 2021-12-26 LAB — ETHANOL: Alcohol, Ethyl (B): <10 mg/dL (ref <10)

## 2021-12-26 MED ORDER — CLOPIDOGREL BISULFATE 75 MG PO TABS: 75.0000 mg | ORAL_TABLET | Freq: Every day | ORAL | Status: DC

## 2021-12-26 MED ORDER — ACETAMINOPHEN 160 MG/5ML PO SOLN: 650.0000 mg | ORAL | Status: DC | PRN

## 2021-12-26 MED ORDER — ACETAMINOPHEN 325 MG PO TABS: 650.0000 mg | ORAL_TABLET | ORAL | Status: DC | PRN

## 2021-12-26 MED ORDER — ACETAMINOPHEN 650 MG RE SUPP: 650.0000 mg | RECTAL | Status: DC | PRN

## 2021-12-26 MED ORDER — ACETAMINOPHEN 325 MG PO TABS: 650.0000 mg | ORAL_TABLET | Freq: Four times a day (QID) | ORAL | Status: DC | PRN

## 2021-12-26 MED ORDER — STROKE: EARLY STAGES OF RECOVERY BOOK
Freq: Once | Status: AC
  Administered 2021-12-27: 1

## 2021-12-26 MED ORDER — SODIUM CHLORIDE 0.9 % IV BOLUS
500.0000 mL | Freq: Once | INTRAVENOUS | Status: AC
  Administered 2021-12-26: 500 mL via INTRAVENOUS

## 2021-12-26 MED ORDER — HEPARIN (PORCINE) 25000 UT/250ML-% IV SOLN
700.0000 [IU]/h | INTRAVENOUS | Status: AC
  Administered 2021-12-26 – 2021-12-28 (×2): 800 [IU]/h via INTRAVENOUS
  Filled 2021-12-26 (×2): qty 250

## 2021-12-26 MED ORDER — SODIUM CHLORIDE 0.9 % IV SOLN: INTRAVENOUS | Status: DC

## 2021-12-26 MED ORDER — VENLAFAXINE HCL ER 75 MG PO CP24
75.0000 mg | ORAL_CAPSULE | Freq: Every day | ORAL | Status: DC
  Administered 2021-12-28 – 2022-01-04 (×8): 75 mg via ORAL
  Filled 2021-12-26 (×8): qty 1

## 2021-12-26 MED ORDER — HYDRALAZINE HCL 20 MG/ML IJ SOLN
5.0000 mg | Freq: Four times a day (QID) | INTRAMUSCULAR | Status: DC | PRN
  Administered 2021-12-27 – 2022-01-01 (×11): 5 mg via INTRAVENOUS
  Filled 2021-12-26 (×11): qty 1

## 2021-12-26 MED ORDER — LORAZEPAM 2 MG/ML IJ SOLN: 0.5000 mg | Freq: Four times a day (QID) | INTRAMUSCULAR | Status: DC | PRN

## 2021-12-26 MED ORDER — INSULIN ASPART 100 UNIT/ML IJ SOLN
0.0000 [IU] | Freq: Three times a day (TID) | INTRAMUSCULAR | Status: DC
  Administered 2021-12-27: 3 [IU] via SUBCUTANEOUS
  Administered 2021-12-27 (×2): 5 [IU] via SUBCUTANEOUS
  Administered 2021-12-28: 8 [IU] via SUBCUTANEOUS
  Administered 2021-12-28: 11 [IU] via SUBCUTANEOUS

## 2021-12-26 NOTE — ED Provider Triage Note (Signed)
Emergency Medicine Provider Triage Evaluation Note  Malayasia Mirkin , a 81 y.o. female  was evaluated in triage.  Pt complains of left sided weakness in face, arms, and legs. She was brought in by EMS after 5 days of similar symptoms at assisted living. She is able to respond to questions but is not verbal during interactions.  Review of Systems  Positive: As above Negative: No chest pain, shortness of breath, or abdominal pain  Physical Exam  BP (!) 167/87   Pulse (!) 110   Temp 97.6 F (36.4 C) (Oral)   Resp 18   LMP  (LMP Unknown)   SpO2 92%  Gen:   Awake, no distress   Resp:  Normal effort  MSK:   Difficulty with movement in general, decreased movement and strength on left side Other:    Medical Decision Making  Medically screening exam initiated at 1:47 PM.  Appropriate orders placed.  Merlina Hoorain Kozakiewicz was informed that the remainder of the evaluation will be completed by another provider, this initial triage assessment does not replace that evaluation, and the importance of remaining in the ED until their evaluation is complete.  Stroke workup   Vladimir Creeks 12/26/21 1352

## 2021-12-26 NOTE — Telephone Encounter (Signed)
FYI

## 2021-12-26 NOTE — Consult Note (Addendum)
NEUROLOGY CONSULTATION NOTE   Date of service: December 26, 2021 Patient Name: Wanda [name] MRN:  [id number] DOB:  [birth year] Reason for consult: "L sided weakness with R frotnal stroke on MRI" Requesting Provider: Valarie Merino, MD _ _ _   _ __   _ __ _ _  __ __   _ __   __ _  History of Present Illness  Wanda [name] is a 81 y.o. female with PMH significant for DM2, prior stroke, HTN, HLD, recently diagnosed PE and on Eliquis who was seen by daughter at her baseline on Wednesday. She was noted to have trouble with her LUE at her assisted living on Thursday and the team tried to get a UA but unable to. Her symptoms persisted and she was brought in to the ED today where CT Head is concerning for a R frontal cortical stroke.  Neurology consulted for further evaluation and workup.  LKW: 12/22/21. mRS: 2 tNKASE: not offered, outside window Thrombectomy: not offered, outside window NIHSS components Score: Comment  1a Level of Conscious 0'[x]'$  1'[]'$  2'[]'$  3'[]'$      1b LOC Questions 0'[x]'$  1'[]'$  2'[]'$       1c LOC Commands 0'[x]'$  1'[]'$  2'[]'$       2 Best Gaze 0'[]'$  1'[x]'$  2'[]'$       3 Visual 0'[x]'$  1'[]'$  2'[]'$  3'[]'$      4 Facial Palsy 0'[]'$  1'[]'$  2'[x]'$  3'[]'$      5a Motor Arm - left 0'[]'$  1'[]'$  2'[]'$  3'[]'$  4'[x]'$  UN'[]'$    5b Motor Arm - Right 0'[x]'$  1'[]'$  2'[]'$  3'[]'$  4'[]'$  UN'[]'$    6a Motor Leg - Left 0'[]'$  1'[]'$  2'[]'$  3'[]'$  4'[x]'$  UN'[]'$    6b Motor Leg - Right 0'[x]'$  1'[]'$  2'[]'$  3'[]'$  4'[]'$  UN'[]'$    7 Limb Ataxia 0'[x]'$  1'[]'$  2'[]'$  3'[]'$  UN'[]'$     8 Sensory 0'[]'$  1'[]'$  2'[x]'$  UN'[]'$      9 Best Language 0'[]'$  1'[x]'$  2'[]'$  3'[]'$      10 Dysarthria 0'[]'$  1'[x]'$  2'[]'$  UN'[]'$      11 Extinct. and Inattention 0'[]'$  1'[]'$  2'[x]'$       TOTAL: 17       ROS   Unable to obtain detailed ROS 2/2 aphasia. Patient denies any pain.  Past History   Past Medical History:  Diagnosis Date   Arthritis    Carotid stenosis 11/2018   Cataract    surgery to remove   Diabetes mellitus    type 2   Hyperlipidemia    Hypertension    Skin cancer    Removed from face   Stroke ([place]) 09/19/2018   Urgency of  urination    Urinary leakage    Past Surgical History:  Procedure Laterality Date   ABDOMINAL HYSTERECTOMY  early 80's   total   CAROTID ENDARTERECTOMY Left 11/13/2018   CARPAL TUNNEL RELEASE Bilateral 1980 and 1981   both hands   ENDARTERECTOMY Left 11/13/2018   Procedure: Left Carotid Artery Endarterectomy with Patch Angioplasty;  Surgeon: Rosetta Posner, MD;  Location: [facility] OR;  Service: Vascular;  Laterality: Left;   EYE SURGERY     bilateral cataracts   Fatty Tumor Excision     JOINT REPLACEMENT     TONSILLECTOMY     TONSILLECTOMY AND ADENOIDECTOMY  age 1   TOTAL KNEE ARTHROPLASTY Right 10/04/2015   TOTAL KNEE ARTHROPLASTY Right 10/04/2015   Procedure: TOTAL KNEE ARTHROPLASTY;  Surgeon: Dorna Leitz, MD;  Location: [facility];  Service: Orthopedics;  Laterality: Right;   TOTAL KNEE ARTHROPLASTY Left 01/28/2016   Procedure: TOTAL KNEE ARTHROPLASTY;  Surgeon: Dorna Leitz, MD;  Location: [facility];  Service: Orthopedics;  Laterality: Left;   TUBAL LIGATION     Family History  Problem Relation Age of Onset   Cancer Mother        liver   Breast cancer Mother 77   Pancreatitis Father        deceased 51   Cancer Brother        GI   Coronary artery disease Paternal Grandmother    Cancer Son        terminal kidney   Breast cancer Maternal Aunt    Cancer Other    Coronary artery disease Paternal Uncle    Social History   Socioeconomic History   Marital status: Widowed    Spouse name: Not on file   Number of children: Not on file   Years of education: Not on file   Highest education level: Not on file  Occupational History   Not on file  Tobacco Use   Smoking status: Never   Smokeless tobacco: Never  Vaping Use   Vaping Use: Never used  Substance and Sexual Activity   Alcohol use: Yes    Alcohol/week: 1.0 standard drink of alcohol    Types: 1 Standard drinks or equivalent per week    Comment: maybe once per month - 3 drinks   Drug use: No   Sexual activity: Not Currently     Birth control/protection: Post-menopausal    Comment: Hysterectomy  Other Topics Concern   Not on file  Social History Narrative   Widowed. Education: [employer]. Exercise: 2 times a week for 30 minutes.   Social Determinants of Health   Financial Resource Strain: Low Risk  (03/18/2020)   Overall Financial Resource Strain (CARDIA)    Difficulty of Paying Living Expenses: Not hard at all  Food Insecurity: No Food Insecurity (03/18/2020)   Hunger Vital Sign    Worried About Running Out of Food in the Last Year: Never true    Ran Out of Food in the Last Year: Never true  Transportation Needs: No Transportation Needs (03/18/2020)   PRAPARE - Hydrologist (Medical): No    Lack of Transportation (Non-Medical): No  Physical Activity: Inactive (03/18/2020)   Exercise Vital Sign    Days of Exercise per Week: 0 days    Minutes of Exercise per Session: 0 min  Stress: No Stress Concern Present (03/18/2020)   [place]    Feeling of Stress : Not at all  Social Connections: Socially Isolated (03/18/2020)   Social Connection and Isolation Panel [NHANES]    Frequency of Communication with Friends and Family: More than three times a week    Frequency of Social Gatherings with Friends and Family: More than three times a week    Attends Religious Services: Never    Marine scientist or Organizations: No    Attends Archivist Meetings: Never    Marital Status: Widowed   Allergies  Allergen Reactions   Ace Inhibitors Diarrhea, Swelling, Other (See Comments) and Cough    Pt had cough and diarrhea with first few doses of medication; also had swelling of right eyelid. Stopped medication on 03/17/11.   Alendronate Sodium Other (See Comments)    "Made my whole body hurt"   Aspirin Hives   Crestor [Rosuvastatin] Other (See Comments)    "Made my whole body hurt"   Lipitor [Atorvastatin Calcium] Other  (  See Comments)    "Made my whole body hurt"   Metformin And Related     Severe abdominal pain   Shellfish Allergy Nausea Only    Intolerant of fresh shellfish, reports the reaction is GI upset, denies hives, denies any swelling  Reports that she can tolerate canned seafood.    Statins Other (See Comments)    myalgia    Medications  (Not in a hospital admission)    Vitals   Vitals:   12/26/21 1342 12/26/21 1531  BP: (!) 167/87 (!) 164/79  Pulse: (!) 110 (!) 107  Resp: 18 18  Temp: 97.6 F (36.4 C)   TempSrc: Oral   SpO2: 92% 95%     There is no height or weight on file to calculate BMI.  Physical Exam   General: Laying comfortably in bed; in no acute distress.  HENT: Normal oropharynx and mucosa. Normal external appearance of ears and nose.  Neck: Supple, no pain or tenderness  CV: No JVD. No peripheral edema.  Pulmonary: Symmetric Chest rise. Normal respiratory effort.  Abdomen: Soft to touch, non-tender.  Ext: No cyanosis, edema, or deformity  Skin: No rash. Normal palpation of skin.   Musculoskeletal: Normal digits and nails by inspection. No clubbing.   Neurologic Examination  Mental status/Cognition: Alert, oriented to self, place, month and year, good attention.  Speech/language: Non fluent, garbled speech with word salad at times, comprehension intact, object naming intact, repetition intact.  Cranial nerves:   CN II Pupils equal and reactive to light, no VF deficits    CN III,IV,VI R gaze preference, crosses midline, no nystagmus.   CN V normal sensation in V1, V2, and V3 segments bilaterally   CN VII Left facial droop   CN VIII Turns head towards speech   CN IX & X normal palatal elevation, no uvular deviation   CN XI Prefers to keep the head to the right.   CN XII midline tongue protrusion   Motor:  Muscle bulk: normal, tone flaccid on the left side. Mvmt Root Nerve  Muscle Right Left Comments  SA C5/6 Ax Deltoid 5 0   EF C5/6 Mc Biceps 5 0   EE  C6/7/8 Rad Triceps 5 0   WF C6/7 Med FCR     WE C7/8 PIN ECU     F Ab C8/T1 U ADM/FDI 5 0   HF L1/2/3 Fem Illopsoas 5 0   KE L2/3/4 Fem Quad 5 0   DF L4/5 D Peron Tib Ant 5 0   PF S1/2 Tibial Grc/Sol 5 0    Sensation:  Light touch Absent in the left upper extremity and left lower extremity.   Pin prick    Temperature    Vibration   Proprioception    Coordination/Complex Motor:  - Finger to Nose intact on the right, unable to do with LUE - Heel to shin unable to do - Gait: deferred.  Labs   CBC:  Recent Labs  Lab 12/26/21 1435 12/26/21 1455  WBC 12.8*  --   NEUTROABS 10.3*  --   HGB 14.7 14.6  HCT 46.2* 43.0  MCV 94.7  --   PLT 274  --     Basic Metabolic Panel:  Lab Results  Component Value Date   NA 138 12/26/2021   K 4.7 12/26/2021   CO2 23 12/01/2021   GLUCOSE 110 (H) 12/26/2021   BUN 20 12/26/2021   CREATININE 0.60 12/26/2021   CALCIUM 9.2 12/01/2021   GFRNONAA >  60 12/01/2021   GFRAA >60 11/14/2018   Lipid Panel:  Lab Results  Component Value Date   LDLCALC 134 (H) 07/25/2021   HgbA1c:  Lab Results  Component Value Date   HGBA1C 11.1 (H) 07/25/2021   Urine Drug Screen:     Component Value Date/Time   LABOPIA NONE DETECTED 09/19/2018 0758   COCAINSCRNUR NONE DETECTED 09/19/2018 0758   LABBENZ NONE DETECTED 09/19/2018 0758   AMPHETMU NONE DETECTED 09/19/2018 0758   THCU NONE DETECTED 09/19/2018 0758   LABBARB NONE DETECTED 09/19/2018 0758    Alcohol Level     Component Value Date/Time   ETH <10 07/24/2021 1122    CT Head without contrast(Personally reviewed): Shows a an acute posterior right frontal lobe infarct.  CT angio Head and Neck with contrast: Pending  MRI Brain(Personally reviewed): pending  Impression   Ketzaly Cardella is a 81 y.o. female with PMH significant for DM2, prior stroke, HTN, HLD, recently diagnosed PE and on Eliquis who was seen by daughter at her baseline on Wednesday. She was noted to have trouble with  her LUE at her assisted living on Thursday and the team tried to get a UA but unable to. Her symptoms persisted and she was brought in to the ED today where CT Head is concerning for a R frontal cortical stroke.   Neuro exam consistent with a R MCA stroke with left sided weakness, left sensory neglect and Gaze preference to the right side. Stroke appears embolic in nature, etiology is unclear, could be paradoxical from her PE vs cardioembolic.  Daughter considering palliative but not yet decided. She would like non invasive stroke workup.  Recommendations   - Frequent Neuro checks per stroke unit protocol - Recommend brain imaging with MRI Brain without contrast - Recommend Vascular imaging with CT angio head and neck - Recommend obtaining TTE - Recommend obtaining Lipid panel with LDL - Please start statin if LDL > 70 - Recommend HbA1c - AC with Heparin gtt with goal heparin levels between 0.3-0.5. - SBP goal - gradual normotension. - Recommend Telemetry monitoring for arrythmia - Recommend bedside swallow screen prior to PO intake. - Stroke education booklet - Recommend PT/OT/SLP consult ______________________________________________________________________   Thank you for the opportunity to take part in the care of this patient. If you have any further questions, please contact the neurology consultation attending.  Signed,  [place] Pager Number [phone] _ _ _   _ __   _ __ _ _  __ __   _ __   __ _

## 2021-12-26 NOTE — ED Triage Notes (Signed)
Pt from [facility] with EMS c.o left arm and leg weakness since thurday, her left side is now flaccid. Pt also has a left facial droop. Pt has hx of prior stroke, baseline is A/O and can normally ambulate but with assistance. Pt arrives to ED a.o

## 2021-12-26 NOTE — ED Notes (Signed)
Pt to MRI

## 2021-12-26 NOTE — Telephone Encounter (Signed)
Wanda [name] from [facility] was at pt's living facility for speech therapy, she stated that pt was acting strange. Mumbling the same words over and over, sleeping a lot a her left arm was much weaker than the right per the tech at the facility. They checked her bp and it was 190/120, so they called EMS to eval for a stroke.

## 2021-12-26 NOTE — ED Provider Notes (Signed)
[facility] EMERGENCY DEPARTMENT Provider Note   CSN: [id number] Arrival date & time: 12/26/21  1334     History  Chief Complaint  Patient presents with   Extremity Weakness    Wanda [name] is a 81 y.o. female.  81 year old female with prior medical history as detailed below presents for evaluation.  Patient with weakness of the left face, left arm and left leg.  Patient with apparently approximately 3 to 4 days of the symptoms.  Patient is unable to provide significant history.  Patient's daughter contacted by phone.  She reports prior history of stroke with some left-sided deficit.  However, patient reports that her deficit had mostly resolved.    The history is provided by the patient and medical records.       Home Medications Prior to Admission medications   Medication Sig Start Date End Date Taking? Authorizing Provider  acetaminophen (TYLENOL) 325 MG tablet Take 650 mg by mouth in the morning and at bedtime.    [provider]  amLODipine (NORVASC) 5 MG tablet TAKE 1 TABLET(5 MG) BY MOUTH DAILY Patient taking differently: Take 5 mg by mouth daily. 03/16/21   Copland, Gay Filler, MD  apixaban (ELIQUIS) 5 MG TABS tablet Take 1 tablet (5 mg total) by mouth 2 (two) times daily. Start on 11/30 12/08/21   Ghimire, Henreitta Leber, MD  apixaban (ELIQUIS) 5 MG TABS tablet Take 2 tablets (10 mg total) by mouth 2 (two) times daily for 7 days. 7 days from 11/23 12/02/21 12/09/21  Jonetta Osgood, MD  blood glucose meter kit and supplies Dispense based on patient and insurance preference. Pt just needs meter 02/26/17   Copland, Gay Filler, MD  Blood Glucose Monitoring Suppl (ONE TOUCH ULTRA MINI) w/Device KIT Use to test blood sugar daily as instructed. Dx: E11.65 01/26/21   Copland, Gay Filler, MD  cloNIDine (CATAPRES) 0.1 MG tablet Take 0.1 mg by mouth 2 (two) times daily.    [provider]  glipiZIDE (GLUCOTROL XL) 5 MG 24 hr tablet Take 2 tabs  in the morning and 1 tab in the evening. Patient taking differently: Take 5-10 mg by mouth See admin instructions. Take 2 tabs in the morning and 1 tab in the evening. 03/16/21   Copland, Gay Filler, MD  glucose blood (ONE TOUCH ULTRA TEST) test strip Test blood sugar 3 times a day. Dx code: 250.00 03/16/21   Copland, Gay Filler, MD  glucose blood (ONETOUCH ULTRA) test strip Use as instructed 06/08/21   Copland, Gay Filler, MD  insulin aspart (NOVOLOG) 100 UNIT/ML injection Inject 3 Units into the skin 3 (three) times daily with meals. 07/29/21   Dahal, Marlowe Aschoff, MD  insulin glargine-yfgn (SEMGLEE) 100 UNIT/ML injection Inject 0.3 mLs (30 Units total) into the skin daily. 07/30/21   Dahal, Marlowe Aschoff, MD  Insulin Pen Needle (BD PEN NEEDLE NANO U/F) 32G X 4 MM MISC USE TO INJECT INSULIN ONCE DAILY 08/31/16   Copland, Gay Filler, MD  Insulin Syringe-Needle U-100 (INSULIN SYRINGE .5CC/31GX5/16") 31G X 5/16" 0.5 ML MISC Use to inject insulin 1 time daily. 09/23/14   Philemon Kingdom, MD  Lancets MISC 1 each by Does not apply route 3 (three) times daily. Dx: E11.65 01/26/21   Copland, Gay Filler, MD  senna-docusate (SENOKOT-S) 8.6-50 MG tablet Take 1 tablet by mouth at bedtime as needed for moderate constipation. Patient taking differently: Take 2 tablets by mouth every evening. 07/29/21   Terrilee Croak, MD  venlafaxine XR [facility]  XR) 37.5 MG 24 hr capsule Take 1 capsule (37.5 mg total) by mouth daily with breakfast. Increase to 75 mg after 1 week Patient taking differently: Take 75 mg by mouth daily with breakfast. Increase to 75 mg after 1 week 09/09/21   Copland, Gay Filler, MD  lisinopril (PRINIVIL,ZESTRIL) 20 MG tablet Take 20 mg by mouth daily.   03/23/11  [provider]      Allergies    Ace inhibitors, Alendronate sodium, Aspirin, Crestor [rosuvastatin], Lipitor [atorvastatin calcium], Metformin and related, Shellfish allergy, and Statins    Review of Systems   Review of Systems  Unable to perform ROS:  Acuity of condition    Physical Exam Updated Vital Signs BP (!) 162/78   Pulse (!) 107   Temp 98.2 F (36.8 C)   Resp 17   Ht _0  (1.549 m)   Wt 67 kg   LMP  (LMP Unknown)   SpO2 96%   BMI 27.91 kg/m  Physical Exam Vitals and nursing note reviewed.  Constitutional:      General: She is not in acute distress.    Appearance: She is well-developed.  HENT:     Head: Normocephalic and atraumatic.  Eyes:     Conjunctiva/sclera: Conjunctivae normal.     Pupils: Pupils are equal, round, and reactive to light.  Cardiovascular:     Rate and Rhythm: Normal rate and regular rhythm.     Heart sounds: Normal heart sounds.  Pulmonary:     Effort: Pulmonary effort is normal. No respiratory distress.     Breath sounds: Normal breath sounds.  Abdominal:     General: There is no distension.     Palpations: Abdomen is soft.     Tenderness: There is no abdominal tenderness.  Musculoskeletal:        General: No deformity. Normal range of motion.     Cervical back: Normal range of motion and neck supple.  Skin:    General: Skin is warm and dry.  Neurological:     Mental Status: She is alert.     Comments: Patient is alert, nonverbal.  Left facial droop, flaccid weakness of the left arm and left leg noted.     ED Results / Procedures / Treatments   Labs (all labs ordered are listed, but only abnormal results are displayed) Labs Reviewed  PROTIME-INR - Abnormal; Notable for the following components:      Result Value   Prothrombin Time 15.8 (*)    INR 1.3 (*)    All other components within normal limits  CBC - Abnormal; Notable for the following components:   WBC 12.8 (*)    HCT 46.2 (*)    All other components within normal limits  DIFFERENTIAL - Abnormal; Notable for the following components:   Neutro Abs 10.3 (*)    Monocytes Absolute 1.1 (*)    All other components within normal limits  COMPREHENSIVE METABOLIC PANEL - Abnormal; Notable for the following components:    CO2 21 (*)    Glucose, Bld 113 (*)    All other components within normal limits  I-STAT CHEM 8, ED - Abnormal; Notable for the following components:   Glucose, Bld 110 (*)    Calcium, Ion 1.09 (*)    All other components within normal limits  ETHANOL  APTT  RAPID URINE DRUG SCREEN, HOSP PERFORMED  URINALYSIS, ROUTINE W REFLEX MICROSCOPIC  LIPID PANEL  HEPARIN LEVEL (UNFRACTIONATED)  CBC  HEPARIN LEVEL (UNFRACTIONATED)  APTT  EKG None  Radiology MR BRAIN WO CONTRAST  Result Date: 12/26/2021 CLINICAL DATA:  Stroke suspected EXAM: MRI HEAD WITHOUT CONTRAST MRA HEAD WITHOUT CONTRAST MRA NECK WITHOUT CONTRAST TECHNIQUE: Multiplanar, multi-echo pulse sequences of the brain and surrounding structures were acquired without intravenous contrast. Angiographic images of the Circle of Willis were acquired using MRA technique without intravenous contrast. Angiographic images of the neck were acquired using MRA technique without intravenous contrast. Carotid stenosis measurements (when applicable) are obtained utilizing NASCET criteria, using the distal internal carotid diameter as the denominator. COMPARISON:  07/25/2021 MRI head, no prior MRA, correlation is made with CT head 07/26/2021 and CTA head and neck 07/24/2021 FINDINGS: MRI HEAD FINDINGS Evaluation is somewhat limited by motion artifact. Brain: Restricted diffusion with ADC correlate in the right MCA territory, the largest area of which involves the posterior right frontal lobe (series 2, image 33), but with additional involvement of the right insula (series 2, image 28), more anterior right frontal lobe (series 2, image 36), and right parietal lobe (series 3, image 12). Many of these areas are adjacent to infarcts noted on 07/25/2021. No acute hemorrhage, mass, mass effect, or midline shift. Encephalomalacia in the right parietal lobe and watershed territory, likely sequela of the 07/25/2021 infarcts. No hydrocephalus or extra-axial  collection. Unchanged size and configuration of the ventricles, with ex vacuo dilatation reflecting central volume loss. Confluent T2 hyperintense signal in the periventricular white matter, likely the sequela of severe chronic small vessel ischemic disease. Vascular: Please see MRA findings below. Skull and upper cervical spine: Normal marrow signal. Sinuses/Orbits: Clear paranasal sinuses. Status post bilateral lens replacements. Other: The mastoids are well aerated. MRA HEAD FINDINGS Evaluation is limited by motion artifact. Anterior circulation: Grossly normal signal in the right ICA and proximal MCA. Poor visualization of the left ACA. Relatively poor signal in the right ICA, with no signal seen past the terminus (series 2, image 60). No definite flow signal is seen in right MCA. Possible signal in the right A1. Posterior circulation: The proximal vertebral arteries are not evaluated. The distal vertebral arteries are patent to the vertebrobasilar junction. The basilar artery is patent to the terminus. The SCA is are not well seen. Grossly normal flow in the bilateral PCAs. MRA NECK FINDINGS Evaluation is limited by motion in the absence of intravenous contrast. Aortic arch: Three-vessel arch. No evidence of aneurysm or dissection in the imaged aorta. Right carotid system: Severe stenosis at the origin of the ICA, with poor flow signal seen in the right compared to the left. On the 2D acquisition, flow signal is seen to the supraclinoid segment, but on the 3D acquisition, definitive signal is not seen past the mid right ICA. Left carotid system: Grossly patent. Vertebral arteries: Left dominant system, with relatively diminutive right vertebral artery. Both vertebral arteries appear patent to the V3 segment. Other: None IMPRESSION: 1. Evaluation is somewhat limited by motion artifact. Within this limitation, there are acute infarcts in the right MCA territory, with the largest area of infarction in the  posterior right frontal lobe, but with additional involvement of the right insula, anterior right frontal lobe, and right parietal lobe. 2. Severe stenosis at the origin of the right ICA, with poor flow signal in the mid and distal right extracranial ICA, relatively diminished signal in the right intracranial ICA, and no signal seen in the right MCA. Minimal signal in the right A1. This is poorly evaluated given the degree of motion and the degree of stenosis and could  be better evaluated with a CTA head and neck. These results were called by telephone at the time of interpretation on 12/26/2021 at 7:36 pm to provider [facility] , who verbally acknowledged these results. Electronically Signed   By: Merilyn Baba M.D.   On: 12/26/2021 19:40   MR ANGIO HEAD WO CONTRAST  Result Date: 12/26/2021 CLINICAL DATA:  Stroke suspected EXAM: MRI HEAD WITHOUT CONTRAST MRA HEAD WITHOUT CONTRAST MRA NECK WITHOUT CONTRAST TECHNIQUE: Multiplanar, multi-echo pulse sequences of the brain and surrounding structures were acquired without intravenous contrast. Angiographic images of the Circle of Willis were acquired using MRA technique without intravenous contrast. Angiographic images of the neck were acquired using MRA technique without intravenous contrast. Carotid stenosis measurements (when applicable) are obtained utilizing NASCET criteria, using the distal internal carotid diameter as the denominator. COMPARISON:  07/25/2021 MRI head, no prior MRA, correlation is made with CT head 07/26/2021 and CTA head and neck 07/24/2021 FINDINGS: MRI HEAD FINDINGS Evaluation is somewhat limited by motion artifact. Brain: Restricted diffusion with ADC correlate in the right MCA territory, the largest area of which involves the posterior right frontal lobe (series 2, image 33), but with additional involvement of the right insula (series 2, image 28), more anterior right frontal lobe (series 2, image 36), and right parietal lobe (series  3, image 12). Many of these areas are adjacent to infarcts noted on 07/25/2021. No acute hemorrhage, mass, mass effect, or midline shift. Encephalomalacia in the right parietal lobe and watershed territory, likely sequela of the 07/25/2021 infarcts. No hydrocephalus or extra-axial collection. Unchanged size and configuration of the ventricles, with ex vacuo dilatation reflecting central volume loss. Confluent T2 hyperintense signal in the periventricular white matter, likely the sequela of severe chronic small vessel ischemic disease. Vascular: Please see MRA findings below. Skull and upper cervical spine: Normal marrow signal. Sinuses/Orbits: Clear paranasal sinuses. Status post bilateral lens replacements. Other: The mastoids are well aerated. MRA HEAD FINDINGS Evaluation is limited by motion artifact. Anterior circulation: Grossly normal signal in the right ICA and proximal MCA. Poor visualization of the left ACA. Relatively poor signal in the right ICA, with no signal seen past the terminus (series 2, image 60). No definite flow signal is seen in right MCA. Possible signal in the right A1. Posterior circulation: The proximal vertebral arteries are not evaluated. The distal vertebral arteries are patent to the vertebrobasilar junction. The basilar artery is patent to the terminus. The SCA is are not well seen. Grossly normal flow in the bilateral PCAs. MRA NECK FINDINGS Evaluation is limited by motion in the absence of intravenous contrast. Aortic arch: Three-vessel arch. No evidence of aneurysm or dissection in the imaged aorta. Right carotid system: Severe stenosis at the origin of the ICA, with poor flow signal seen in the right compared to the left. On the 2D acquisition, flow signal is seen to the supraclinoid segment, but on the 3D acquisition, definitive signal is not seen past the mid right ICA. Left carotid system: Grossly patent. Vertebral arteries: Left dominant system, with relatively diminutive right  vertebral artery. Both vertebral arteries appear patent to the V3 segment. Other: None IMPRESSION: 1. Evaluation is somewhat limited by motion artifact. Within this limitation, there are acute infarcts in the right MCA territory, with the largest area of infarction in the posterior right frontal lobe, but with additional involvement of the right insula, anterior right frontal lobe, and right parietal lobe. 2. Severe stenosis at the origin of the right ICA, with poor  flow signal in the mid and distal right extracranial ICA, relatively diminished signal in the right intracranial ICA, and no signal seen in the right MCA. Minimal signal in the right A1. This is poorly evaluated given the degree of motion and the degree of stenosis and could be better evaluated with a CTA head and neck. These results were called by telephone at the time of interpretation on 12/26/2021 at 7:36 pm to provider [facility] , who verbally acknowledged these results. Electronically Signed   By: Merilyn Baba M.D.   On: 12/26/2021 19:40   MR ANGIO NECK WO CONTRAST  Result Date: 12/26/2021 CLINICAL DATA:  Stroke suspected EXAM: MRI HEAD WITHOUT CONTRAST MRA HEAD WITHOUT CONTRAST MRA NECK WITHOUT CONTRAST TECHNIQUE: Multiplanar, multi-echo pulse sequences of the brain and surrounding structures were acquired without intravenous contrast. Angiographic images of the Circle of Willis were acquired using MRA technique without intravenous contrast. Angiographic images of the neck were acquired using MRA technique without intravenous contrast. Carotid stenosis measurements (when applicable) are obtained utilizing NASCET criteria, using the distal internal carotid diameter as the denominator. COMPARISON:  07/25/2021 MRI head, no prior MRA, correlation is made with CT head 07/26/2021 and CTA head and neck 07/24/2021 FINDINGS: MRI HEAD FINDINGS Evaluation is somewhat limited by motion artifact. Brain: Restricted diffusion with ADC correlate in the  right MCA territory, the largest area of which involves the posterior right frontal lobe (series 2, image 33), but with additional involvement of the right insula (series 2, image 28), more anterior right frontal lobe (series 2, image 36), and right parietal lobe (series 3, image 12). Many of these areas are adjacent to infarcts noted on 07/25/2021. No acute hemorrhage, mass, mass effect, or midline shift. Encephalomalacia in the right parietal lobe and watershed territory, likely sequela of the 07/25/2021 infarcts. No hydrocephalus or extra-axial collection. Unchanged size and configuration of the ventricles, with ex vacuo dilatation reflecting central volume loss. Confluent T2 hyperintense signal in the periventricular white matter, likely the sequela of severe chronic small vessel ischemic disease. Vascular: Please see MRA findings below. Skull and upper cervical spine: Normal marrow signal. Sinuses/Orbits: Clear paranasal sinuses. Status post bilateral lens replacements. Other: The mastoids are well aerated. MRA HEAD FINDINGS Evaluation is limited by motion artifact. Anterior circulation: Grossly normal signal in the right ICA and proximal MCA. Poor visualization of the left ACA. Relatively poor signal in the right ICA, with no signal seen past the terminus (series 2, image 60). No definite flow signal is seen in right MCA. Possible signal in the right A1. Posterior circulation: The proximal vertebral arteries are not evaluated. The distal vertebral arteries are patent to the vertebrobasilar junction. The basilar artery is patent to the terminus. The SCA is are not well seen. Grossly normal flow in the bilateral PCAs. MRA NECK FINDINGS Evaluation is limited by motion in the absence of intravenous contrast. Aortic arch: Three-vessel arch. No evidence of aneurysm or dissection in the imaged aorta. Right carotid system: Severe stenosis at the origin of the ICA, with poor flow signal seen in the right compared to  the left. On the 2D acquisition, flow signal is seen to the supraclinoid segment, but on the 3D acquisition, definitive signal is not seen past the mid right ICA. Left carotid system: Grossly patent. Vertebral arteries: Left dominant system, with relatively diminutive right vertebral artery. Both vertebral arteries appear patent to the V3 segment. Other: None IMPRESSION: 1. Evaluation is somewhat limited by motion artifact. Within this limitation, there  are acute infarcts in the right MCA territory, with the largest area of infarction in the posterior right frontal lobe, but with additional involvement of the right insula, anterior right frontal lobe, and right parietal lobe. 2. Severe stenosis at the origin of the right ICA, with poor flow signal in the mid and distal right extracranial ICA, relatively diminished signal in the right intracranial ICA, and no signal seen in the right MCA. Minimal signal in the right A1. This is poorly evaluated given the degree of motion and the degree of stenosis and could be better evaluated with a CTA head and neck. These results were called by telephone at the time of interpretation on 12/26/2021 at 7:36 pm to provider [facility] , who verbally acknowledged these results. Electronically Signed   By: Merilyn Baba M.D.   On: 12/26/2021 19:40   DG Chest 1 View  Result Date: 12/26/2021 CLINICAL DATA:  [id number] Stroke (cerebrum) [facility]) [id number] EXAM: CHEST  1 VIEW COMPARISON:  September 19, 2018 FINDINGS: The cardiomediastinal silhouette is unchanged in contour.Atherosclerotic calcifications of the aorta. No pleural effusion. No pneumothorax. Mildly increased bronchitic markings bilaterally. No focal consolidation. Visualized abdomen is unremarkable. IMPRESSION: Mildly increased bronchitic markings bilaterally. This is nonspecific and could reflect bronchitis or mild underlying pulmonary edema. Electronically Signed   By: Valentino Saxon M.D.   On: 12/26/2021 19:37   CT HEAD  WO CONTRAST  Result Date: 12/26/2021 CLINICAL DATA:  Neuro deficit, acute, stroke suspected EXAM: CT HEAD WITHOUT CONTRAST TECHNIQUE: Contiguous axial images were obtained from the base of the skull through the vertex without intravenous contrast. RADIATION DOSE REDUCTION: This exam was performed according to the departmental dose-optimization program which includes automated exposure control, adjustment of the mA and/or kV according to patient size and/or use of iterative reconstruction technique. COMPARISON:  CT November 30, 2021. FINDINGS: Brain: New/interval infarct in the posterior right frontal lobe, potentially acute. No evidence of acute hemorrhage, hydrocephalus, extra-axial collection or mass lesion/mass effect. Additional remote right frontal and parietal infarcts. Additional patchy white matter hypodensities, nonspecific but compatible with chronic microvascular ischemic disease. Vascular: No hyperdense vessel.  Calcific atherosclerosis. Skull: No acute fracture. Sinuses/Orbits: No acute finding. Other: No mastoid effusions. IMPRESSION: New/interval infarct in the posterior right frontal lobe, potentially acute. Recommend MRI for further evaluation. Electronically Signed   By: Margaretha Sheffield M.D.   On: 12/26/2021 15:01    Procedures Procedures    Medications Ordered in ED Medications  hydrALAZINE (APRESOLINE) injection 5 mg (has no administration in time range)  venlafaxine XR (EFFEXOR-XR) 24 hr capsule 75 mg (has no administration in time range)   stroke: early stages of recovery book (has no administration in time range)  0.9 %  sodium chloride infusion ( Intravenous New Bag/Given 12/26/21 2121)  clopidogrel (PLAVIX) tablet 75 mg (has no administration in time range)  insulin aspart (novoLOG) injection 0-15 Units (has no administration in time range)  LORazepam (ATIVAN) injection 0.5 mg (has no administration in time range)  acetaminophen (TYLENOL) tablet 650 mg (has no  administration in time range)    Or  acetaminophen (TYLENOL) 160 MG/5ML solution 650 mg (has no administration in time range)    Or  acetaminophen (TYLENOL) suppository 650 mg (has no administration in time range)  heparin ADULT infusion 100 units/mL (25000 units/270m) (800 Units/hr Intravenous New Bag/Given 12/26/21 2128)  sodium chloride 0.9 % bolus 500 mL (0 mLs Intravenous Stopped 12/26/21 1705)    ED Course/ Medical Decision Making/ A&P  Medical Decision Making Amount and/or Complexity of Data Reviewed Radiology: ordered.  Risk Decision regarding hospitalization.    Medical Screen Complete  This patient presented to the ED with complaint of left-sided weakness.  This complaint involves an extensive number of treatment options. The initial differential diagnosis includes, but is not limited to, CVA  This presentation is: Acute, Chronic, Self-Limited, Previously Undiagnosed, Uncertain Prognosis, Complicated, Systemic Symptoms, and Threat to Life/Bodily Function  Patient is presenting with left-sided weakness.  Patient with symptoms lasting approximately 3 to 4 days per report.  Patient presentation is consistent with CVA.  Patient is outside the window for acute intervention.  Neuro is aware of case and will consult.  Patient will require admission for further workup and treatment.  Hospitalist service made aware of case and will evaluate for admission.  Additional history obtained:  Additional history obtained from EMS and Family External records from outside sources obtained and reviewed including prior ED visits and prior Inpatient records.    Lab Tests:  I ordered and personally interpreted labs.  The pertinent results include: CBC, CMP, i-STAT Chem-8,   Imaging Studies ordered:  I ordered imaging studies including CT head, chest x-ray I independently visualized and interpreted obtained imaging which showed acute infarct I agree  with the radiologist interpretation.   Cardiac Monitoring:  The patient was maintained on a cardiac monitor.  I personally viewed and interpreted the cardiac monitor which showed an underlying rhythm of: NSR   Problem List / ED Course:  CVA   Reevaluation:  After the interventions noted above, I reevaluated the patient and found that they have: stayed the same  Disposition:  After consideration of the diagnostic results and the patients response to treatment, I feel that the patent would benefit from admission.          Final Clinical Impression(s) / ED Diagnoses Final diagnoses:  Cerebrovascular accident (CVA), unspecified mechanism ([place])    Rx / DC Orders ED Discharge Orders     None         Valarie Merino, MD 12/26/21 [phone]

## 2021-12-26 NOTE — Progress Notes (Addendum)
ANTICOAGULATION CONSULT NOTE - Initial Consult  Pharmacy Consult for heparin  Indication: DVT, hx of PE 11/2021 on Eliquis PTA   Allergies  Allergen Reactions   Ace Inhibitors Diarrhea, Swelling, Other (See Comments) and Cough    Pt had cough and diarrhea with first few doses of medication; also had swelling of right eyelid. Stopped medication on 03/17/11.   Alendronate Sodium Other (See Comments)    "Made my whole body hurt"   Aspirin Hives   Crestor [Rosuvastatin] Other (See Comments)    "Made my whole body hurt"   Lipitor [Atorvastatin Calcium] Other (See Comments)    "Made my whole body hurt"   Metformin And Related     Severe abdominal pain   Shellfish Allergy Nausea Only    Intolerant of fresh shellfish, reports the reaction is GI upset, denies hives, denies any swelling  Reports that she can tolerate canned seafood.    Statins Other (See Comments)    myalgia    Patient Measurements:   Heparin Dosing Weight: 61.9kg   Vital Signs: Temp: 97.6 F (36.4 C) (12/18 1342) Temp Source: Oral (12/18 1342) BP: 162/76 (12/18 1600) Pulse Rate: 110 (12/18 1600)  Labs: Recent Labs    12/26/21 1435 12/26/21 1455  HGB 14.7 14.6  HCT 46.2* 43.0  PLT 274  --   APTT 29  --   LABPROT 15.8*  --   INR 1.3*  --   CREATININE 0.78 0.60    CrCl cannot be calculated (Unknown ideal weight.).   Medical History: Past Medical History:  Diagnosis Date   Arthritis    Carotid stenosis 11/2018   Cataract    surgery to remove   Diabetes mellitus    type 2   Hyperlipidemia    Hypertension    Skin cancer    Removed from face   Stroke [facility]) 09/19/2018   Urgency of urination    Urinary leakage     Assessment: Patient admitted with stroke like symptoms, head CT concerning for R frontal cortical stroke. She was out of the window for TNK. Patient on Eliquis PTA for PE 11/2021. Last dose unclear. Patient confused and unable to answer, called family x 2 with no response. Based on time  of admission, has been at least 8 hours from last dose of Eliquis. Pharmacy consulted to dose heparin.   Goal of Therapy:  Anti-Xa 0.3-0.5 aPTT 66-85 Monitor platelets by anticoagulation protocol: Yes   Plan:  No bolus with recent CVA and Eliquis intake.  Start heparin infusion at 800 units/hr Check anti-Xa level in 8 hours and daily while on heparin, will obtain aPTT in addition to heparin level due to recent DOAC intake.  Continue to monitor H&H and platelets  Ventura Sellers 12/26/2021,6:49 PM

## 2021-12-26 NOTE — H&P (Signed)
History and Physical    Wanda [name] TZG:[id number] DOB: [birth year] DOA: 12/26/2021  PCP: Darreld Mclean, MD (Confirm with patient/family/NH records and if not entered, this has to be entered at [facility] point of entry) Patient coming from: Home  I have personally briefly reviewed patient's old medical records in [place]  Chief Complaint: Patient only mumbling  HPI: Wanda [name] is a 81 y.o. female with medical history significant of recent diagnosed DVT on Eliquis, recent stroke in July 2023 on Plavix, HTN, HLD, brought in by daughter for evaluation of worsening left-sided weakness.  Patient lives by herself in the assisted living environment.  Patient was last seen normal on Friday.  Yesterday, daughter called patient and found she was only mumbling and appeared to be confused.  She continued to see her and found she has a new weakness on the left side which she reported has had it the weakness for at least 1+ day.  This morning, the patient became flaccid on the left side also developed a left facial droop which is new.  Her speech is not improving and she has been very sleepy since yesterday afternoon.  Patient was found to have DVT in left common femoral vein, and patient was started on Eliquis and Plavix was discontinued. ED Course: Tachycardia, blood pressure elevated, mentation remained lethargic, and CT head without contrast showed subacute right frontal lobe CVA.  Review of Systems: Unable to perform, patient lethargic and confused when awake Past Medical History:  Diagnosis Date   Arthritis    Carotid stenosis 11/2018   Cataract    surgery to remove   Diabetes mellitus    type 2   Hyperlipidemia    Hypertension    Skin cancer    Removed from face   Stroke ([place]) 09/19/2018   Urgency of urination    Urinary leakage     Past Surgical History:  Procedure Laterality Date   ABDOMINAL HYSTERECTOMY  early 80's   total   CAROTID ENDARTERECTOMY Left  11/13/2018   CARPAL TUNNEL RELEASE Bilateral 1980 and 1981   both hands   ENDARTERECTOMY Left 11/13/2018   Procedure: Left Carotid Artery Endarterectomy with Patch Angioplasty;  Surgeon: Rosetta Posner, MD;  Location: [facility] OR;  Service: Vascular;  Laterality: Left;   EYE SURGERY     bilateral cataracts   Fatty Tumor Excision     JOINT REPLACEMENT     TONSILLECTOMY     TONSILLECTOMY AND ADENOIDECTOMY  age 58   TOTAL KNEE ARTHROPLASTY Right 10/04/2015   TOTAL KNEE ARTHROPLASTY Right 10/04/2015   Procedure: TOTAL KNEE ARTHROPLASTY;  Surgeon: Dorna Leitz, MD;  Location: [facility];  Service: Orthopedics;  Laterality: Right;   TOTAL KNEE ARTHROPLASTY Left 01/28/2016   Procedure: TOTAL KNEE ARTHROPLASTY;  Surgeon: Dorna Leitz, MD;  Location: [facility];  Service: Orthopedics;  Laterality: Left;   TUBAL LIGATION       reports that she has never smoked. She has never used smokeless tobacco. She reports current alcohol use of about 1.0 standard drink of alcohol per week. She reports that she does not use drugs.  Allergies  Allergen Reactions   Ace Inhibitors Diarrhea, Swelling, Other (See Comments) and Cough    Pt had cough and diarrhea with first few doses of medication; also had swelling of right eyelid. Stopped medication on 03/17/11.   Alendronate Sodium Other (See Comments)    "Made my whole body hurt"   Aspirin Hives   Crestor [Rosuvastatin] Other (  See Comments)    "Made my whole body hurt"   Lipitor [Atorvastatin Calcium] Other (See Comments)    "Made my whole body hurt"   Metformin And Related     Severe abdominal pain   Shellfish Allergy Nausea Only    Intolerant of fresh shellfish, reports the reaction is GI upset, denies hives, denies any swelling  Reports that she can tolerate canned seafood.    Statins Other (See Comments)    myalgia    Family History  Problem Relation Age of Onset   Cancer Mother        liver   Breast cancer Mother 24   Pancreatitis Father        deceased 18    Cancer Brother        GI   Coronary artery disease Paternal Grandmother    Cancer Son        terminal kidney   Breast cancer Maternal Aunt    Cancer Other    Coronary artery disease Paternal Uncle      Prior to Admission medications   Medication Sig Start Date End Date Taking? Authorizing Provider  acetaminophen (TYLENOL) 325 MG tablet Take 650 mg by mouth in the morning and at bedtime.    [provider]  amLODipine (NORVASC) 5 MG tablet TAKE 1 TABLET(5 MG) BY MOUTH DAILY Patient taking differently: Take 5 mg by mouth daily. 03/16/21   Copland, Gay Filler, MD  apixaban (ELIQUIS) 5 MG TABS tablet Take 1 tablet (5 mg total) by mouth 2 (two) times daily. Start on 11/30 12/08/21   Ghimire, Henreitta Leber, MD  apixaban (ELIQUIS) 5 MG TABS tablet Take 2 tablets (10 mg total) by mouth 2 (two) times daily for 7 days. 7 days from 11/23 12/02/21 12/09/21  Jonetta Osgood, MD  blood glucose meter kit and supplies Dispense based on patient and insurance preference. Pt just needs meter 02/26/17   Copland, Gay Filler, MD  Blood Glucose Monitoring Suppl (ONE TOUCH ULTRA MINI) w/Device KIT Use to test blood sugar daily as instructed. Dx: E11.65 01/26/21   Copland, Gay Filler, MD  cloNIDine (CATAPRES) 0.1 MG tablet Take 0.1 mg by mouth 2 (two) times daily.    [provider]  glipiZIDE (GLUCOTROL XL) 5 MG 24 hr tablet Take 2 tabs in the morning and 1 tab in the evening. Patient taking differently: Take 5-10 mg by mouth See admin instructions. Take 2 tabs in the morning and 1 tab in the evening. 03/16/21   Copland, Gay Filler, MD  glucose blood (ONE TOUCH ULTRA TEST) test strip Test blood sugar 3 times a day. Dx code: 250.00 03/16/21   Copland, Gay Filler, MD  glucose blood (ONETOUCH ULTRA) test strip Use as instructed 06/08/21   Copland, Gay Filler, MD  insulin aspart (NOVOLOG) 100 UNIT/ML injection Inject 3 Units into the skin 3 (three) times daily with meals. 07/29/21   Dahal, Marlowe Aschoff, MD  insulin  glargine-yfgn (SEMGLEE) 100 UNIT/ML injection Inject 0.3 mLs (30 Units total) into the skin daily. 07/30/21   Dahal, Marlowe Aschoff, MD  Insulin Pen Needle (BD PEN NEEDLE NANO U/F) 32G X 4 MM MISC USE TO INJECT INSULIN ONCE DAILY 08/31/16   Copland, Gay Filler, MD  Insulin Syringe-Needle U-100 (INSULIN SYRINGE .5CC/31GX5/16") 31G X 5/16" 0.5 ML MISC Use to inject insulin 1 time daily. 09/23/14   Philemon Kingdom, MD  Lancets MISC 1 each by Does not apply route 3 (three) times daily. Dx: E11.65 01/26/21   Copland,  Gay Filler, MD  senna-docusate (SENOKOT-S) 8.6-50 MG tablet Take 1 tablet by mouth at bedtime as needed for moderate constipation. Patient taking differently: Take 2 tablets by mouth every evening. 07/29/21   Dahal, Marlowe Aschoff, MD  venlafaxine XR (EFFEXOR XR) 37.5 MG 24 hr capsule Take 1 capsule (37.5 mg total) by mouth daily with breakfast. Increase to 75 mg after 1 week Patient taking differently: Take 75 mg by mouth daily with breakfast. Increase to 75 mg after 1 week 09/09/21   Copland, Gay Filler, MD  lisinopril (PRINIVIL,ZESTRIL) 20 MG tablet Take 20 mg by mouth daily.   03/23/11  [provider]    Physical Exam: Vitals:   12/26/21 1342 12/26/21 1531 12/26/21 1545 12/26/21 1600  BP: (!) 167/87 (!) 164/79 (!) 145/71 (!) 162/76  Pulse: (!) 110 (!) 107 (!) 103 (!) 110  Resp: _0 Temp: 97.6 F (36.4 C)     TempSrc: Oral     SpO2: 92% 95% 95% 95%    Constitutional: NAD, calm, comfortable Vitals:   12/26/21 1342 12/26/21 1531 12/26/21 1545 12/26/21 1600  BP: (!) 167/87 (!) 164/79 (!) 145/71 (!) 162/76  Pulse: (!) 110 (!) 107 (!) 103 (!) 110  Resp: _1 Temp: 97.6 F (36.4 C)     TempSrc: Oral     SpO2: 92% 95% 95% 95%   Eyes: PERRL, lids and conjunctivae normal ENMT: Mucous membranes are moist. Posterior pharynx clear of any exudate or lesions.Normal dentition.  Neck: normal, supple, no masses, no thyromegaly Respiratory: clear to auscultation bilaterally, no  wheezing, no crackles. Normal respiratory effort. No accessory muscle use.  Cardiovascular: Regular rate and rhythm, no murmurs / rubs / gallops. No extremity edema. 2+ pedal pulses. No carotid bruits.  Abdomen: no tenderness, no masses palpated. No hepatosplenomegaly. Bowel sounds positive.  Musculoskeletal: no clubbing / cyanosis. No joint deformity upper and lower extremities. Good ROM, no contractures. Normal muscle tone.  Skin: no rashes, lesions, ulcers. No induration Neurologic: Left facial droop, left arm and leg muscle flaccid compared to actively moving right side, rest of neuroexam cannot be performed due to patient mentation changes psychiatric: Lethargic, arousable, confused    Labs on Admission: I have personally reviewed following labs and imaging studies  CBC: Recent Labs  Lab 12/26/21 1435 12/26/21 1455  WBC 12.8*  --   NEUTROABS 10.3*  --   HGB 14.7 14.6  HCT 46.2* 43.0  MCV 94.7  --   PLT 274  --    Basic Metabolic Panel: Recent Labs  Lab 12/26/21 1435 12/26/21 1455  NA 137 138  K 3.5 4.7  CL 102 105  CO2 21*  --   GLUCOSE 113* 110*  BUN 14 20  CREATININE 0.78 0.60  CALCIUM 10.1  --    GFR: CrCl cannot be calculated (Unknown ideal weight.). Liver Function Tests: Recent Labs  Lab 12/26/21 1435  AST 20  ALT 13  ALKPHOS 76  BILITOT 0.9  PROT 7.8  ALBUMIN 4.0   No results for input(s): "LIPASE", "AMYLASE" in the last 168 hours. No results for input(s): "AMMONIA" in the last 168 hours. Coagulation Profile: Recent Labs  Lab 12/26/21 1435  INR 1.3*   Cardiac Enzymes: No results for input(s): "CKTOTAL", "CKMB", "CKMBINDEX", "TROPONINI" in the last 168 hours. BNP (last 3 results) No results for input(s): "PROBNP" in the last 8760 hours. HbA1C: No results for input(s): "HGBA1C" in the last 72 hours. CBG: No results for input(s): "  GLUCAP" in the last 168 hours. Lipid Profile: No results for input(s): "CHOL", "HDL", "LDLCALC", "TRIG",  "CHOLHDL", "LDLDIRECT" in the last 72 hours. Thyroid Function Tests: No results for input(s): "TSH", "T4TOTAL", "FREET4", "T3FREE", "THYROIDAB" in the last 72 hours. Anemia Panel: No results for input(s): "VITAMINB12", "FOLATE", "FERRITIN", "TIBC", "IRON", "RETICCTPCT" in the last 72 hours. Urine analysis:    Component Value Date/Time   COLORURINE STRAW (A) 11/30/2021 1950   APPEARANCEUR CLEAR 11/30/2021 1950   LABSPEC 1.003 (L) 11/30/2021 1950   PHURINE 7.0 11/30/2021 1950   GLUCOSEU >=500 (A) 11/30/2021 1950   HGBUR NEGATIVE 11/30/2021 1950   BILIRUBINUR NEGATIVE 11/30/2021 1950   BILIRUBINUR neg 07/18/2012 [place] 11/30/2021 1950   PROTEINUR NEGATIVE 11/30/2021 1950   UROBILINOGEN 0.2 07/18/2012 0938   NITRITE NEGATIVE 11/30/2021 1950   LEUKOCYTESUR NEGATIVE 11/30/2021 1950    Radiological Exams on Admission: CT HEAD WO CONTRAST  Result Date: 12/26/2021 CLINICAL DATA:  Neuro deficit, acute, stroke suspected EXAM: CT HEAD WITHOUT CONTRAST TECHNIQUE: Contiguous axial images were obtained from the base of the skull through the vertex without intravenous contrast. RADIATION DOSE REDUCTION: This exam was performed according to the departmental dose-optimization program which includes automated exposure control, adjustment of the mA and/or kV according to patient size and/or use of iterative reconstruction technique. COMPARISON:  CT November 30, 2021. FINDINGS: Brain: New/interval infarct in the posterior right frontal lobe, potentially acute. No evidence of acute hemorrhage, hydrocephalus, extra-axial collection or mass lesion/mass effect. Additional remote right frontal and parietal infarcts. Additional patchy white matter hypodensities, nonspecific but compatible with chronic microvascular ischemic disease. Vascular: No hyperdense vessel.  Calcific atherosclerosis. Skull: No acute fracture. Sinuses/Orbits: No acute finding. Other: No mastoid effusions. IMPRESSION:  New/interval infarct in the posterior right frontal lobe, potentially acute. Recommend MRI for further evaluation. Electronically Signed   By: Margaretha Sheffield M.D.   On: 12/26/2021 15:01    EKG: Pending  Assessment/Plan Principal Problem:   Stroke (cerebrum) (HCC) Active Problems:   Essential hypertension   Hyperlipidemia with target LDL less than 70   DVT (deep venous thrombosis) ([place])  (please populate well all problems here in Problem List. (For example, if patient is on BP meds at home and you resume or decide to hold them, it is a problem that needs to be her. Same for CAD, COPD, HLD and so on)  Right frontal stroke, subacute -With significant left-sided paralysis, left facial droop and aphasia and also possible dysphagia -Out of window for TNK and patient on Eliquis -Overall prognosis poor given the significant impaired and continuous worsening neurological function.  Daughter made aware at bedside, likelihood of being disabled from the stroke is high and full recovery probably of significant difficulty.  Daughter/POA agreed with DNR, and in the next few days, PT evaluation speech evaluation, then if no significant improvement, daughter agrees with putative palliative care consultation in the next 48-72 hours. -Discussed with on-call neurology, recommended resume systemic anticoagulation, as patient is n.p.o. right now, will start patient on heparin drip -Continue Plavix when able to take PO -A1 and lipid panel  Acute metabolic encephalopathy, TOI=71 -Probably related to large stroke on right frontal lobe -Other DDx, UA pending, low suspicion for seizure at this point  Leukocytosis -Daughter reported that yesterday afternoon patient has had occasional dry cough, given the stroke and possible dysphagia, check checks x-ray. No fever, hold off ABX. -UA pending  HTN -NPO for now -Hold off home BP meds -PRN Hydralazine for now  IDDM -Hold off home insulin regimen as she is  NPO -Sliding scale for now.  Recent DVT -On stroke, resume Eliquis on discharge if able to swallow  Recent stroke -Stroke in July on the right MCA territory and patient was started on Plavix discharge (ASA allergy).  CT angiogram at that time showed a branch of ACA stenosis, relation to today's stroke is unknown.  DVT prophylaxis: Heparin drip Code Status: DNR Family Communication: Daughter at bedside Disposition Plan: Patient sick with symptomatic disabling stroke with left-sided flaxseed, requiring inpatient neurological evaluation and inpatient PT evaluation, expect discharge to hospice potentially vs SNF, plans on clinical progress Consults called: Neurology Admission status: Tele admit   Lequita Halt MD [facility] Pager [phone] 12/26/2021, 6:27 PM

## 2021-12-27 ENCOUNTER — Inpatient Hospital Stay (HOSPITAL_COMMUNITY): Payer: Medicare Other

## 2021-12-27 ENCOUNTER — Other Ambulatory Visit (HOSPITAL_COMMUNITY): Payer: Self-pay

## 2021-12-27 ENCOUNTER — Encounter (HOSPITAL_COMMUNITY): Payer: Self-pay | Admitting: Anesthesiology

## 2021-12-27 DIAGNOSIS — I6389 Other cerebral infarction: Secondary | ICD-10-CM | POA: Diagnosis not present

## 2021-12-27 DIAGNOSIS — I6521 Occlusion and stenosis of right carotid artery: Secondary | ICD-10-CM

## 2021-12-27 DIAGNOSIS — I10 Essential (primary) hypertension: Secondary | ICD-10-CM | POA: Diagnosis not present

## 2021-12-27 DIAGNOSIS — I824Y9 Acute embolism and thrombosis of unspecified deep veins of unspecified proximal lower extremity: Secondary | ICD-10-CM | POA: Diagnosis not present

## 2021-12-27 DIAGNOSIS — I63031 Cerebral infarction due to thrombosis of right carotid artery: Secondary | ICD-10-CM | POA: Diagnosis not present

## 2021-12-27 DIAGNOSIS — E785 Hyperlipidemia, unspecified: Secondary | ICD-10-CM | POA: Diagnosis not present

## 2021-12-27 LAB — ECHOCARDIOGRAM COMPLETE
Area-P 1/2: 9.48 cm2
Height: 61 in
S' Lateral: 2.1 cm
Single Plane A4C EF: 82.3 %
Weight: 2363.33 oz

## 2021-12-27 LAB — COMPREHENSIVE METABOLIC PANEL
ALT: 12 U/L (ref 0–44)
AST: 16 U/L (ref 15–41)
Albumin: 3.2 g/dL — ABNORMAL LOW (ref 3.5–5.0)
Alkaline Phosphatase: 64 U/L (ref 38–126)
Anion gap: 14 (ref 5–15)
BUN: 15 mg/dL (ref 8–23)
CO2: 19 mmol/L — ABNORMAL LOW (ref 22–32)
Calcium: 9.1 mg/dL (ref 8.9–10.3)
Chloride: 106 mmol/L (ref 98–111)
Creatinine, Ser: 0.78 mg/dL (ref 0.44–1.00)
GFR, Estimated: >60 mL/min (ref >60)
Glucose, Bld: 172 mg/dL — ABNORMAL HIGH (ref 70–99)
Potassium: 3.4 mmol/L — ABNORMAL LOW (ref 3.5–5.1)
Sodium: 139 mmol/L (ref 135–145)
Total Bilirubin: 1.1 mg/dL (ref 0.3–1.2)
Total Protein: 6.4 g/dL — ABNORMAL LOW (ref 6.5–8.1)

## 2021-12-27 LAB — APTT
aPTT: 84 seconds — ABNORMAL HIGH (ref 24–36)
aPTT: 75 seconds — ABNORMAL HIGH (ref 24–36)

## 2021-12-27 LAB — GLUCOSE, CAPILLARY
Glucose-Capillary: 167 mg/dL — ABNORMAL HIGH (ref 70–99)
Glucose-Capillary: 174 mg/dL — ABNORMAL HIGH (ref 70–99)

## 2021-12-27 LAB — MAGNESIUM: Magnesium: 1.5 mg/dL — ABNORMAL LOW (ref 1.7–2.4)

## 2021-12-27 LAB — CBC
HCT: 35.9 % — ABNORMAL LOW (ref 36.0–46.0)
Hemoglobin: 12.3 g/dL (ref 12.0–15.0)
MCH: 31.1 pg (ref 26.0–34.0)
MCHC: 34.3 g/dL (ref 30.0–36.0)
MCV: 90.7 fL (ref 80.0–100.0)
Platelets: 291 10*3/uL (ref 150–400)
RBC: 3.96 MIL/uL (ref 3.87–5.11)
RDW: 13.7 % (ref 11.5–15.5)
WBC: 14.1 10*3/uL — ABNORMAL HIGH (ref 4.0–10.5)
nRBC: 0 % (ref 0.0–0.2)

## 2021-12-27 LAB — CBG MONITORING, ED
Glucose-Capillary: 202 mg/dL — ABNORMAL HIGH (ref 70–99)
Glucose-Capillary: 211 mg/dL — ABNORMAL HIGH (ref 70–99)

## 2021-12-27 LAB — PHOSPHORUS: Phosphorus: 3 mg/dL (ref 2.5–4.6)

## 2021-12-27 LAB — LIPID PANEL
Cholesterol: 175 mg/dL (ref 0–200)
HDL: 37 mg/dL — ABNORMAL LOW (ref >40)
LDL Cholesterol: 120 mg/dL — ABNORMAL HIGH (ref 0–99)
Total CHOL/HDL Ratio: 4.7 RATIO
Triglycerides: 92 mg/dL (ref <150)
VLDL: 18 mg/dL (ref 0–40)

## 2021-12-27 LAB — HEPARIN LEVEL (UNFRACTIONATED)
Heparin Unfractionated: >1.1 IU/mL — ABNORMAL HIGH (ref 0.30–0.70)
Heparin Unfractionated: >1.1 IU/mL — ABNORMAL HIGH (ref 0.30–0.70)

## 2021-12-27 MED ORDER — TICAGRELOR 90 MG PO TABS
180.0000 mg | ORAL_TABLET | Freq: Once | ORAL | Status: DC
  Filled 2021-12-27: qty 2

## 2021-12-27 MED ORDER — EZETIMIBE 10 MG PO TABS
10.0000 mg | ORAL_TABLET | Freq: Every day | ORAL | Status: DC
  Administered 2021-12-28 – 2022-01-04 (×8): 10 mg via ORAL
  Filled 2021-12-27 (×8): qty 1

## 2021-12-27 MED ORDER — TICAGRELOR 90 MG PO TABS
90.0000 mg | ORAL_TABLET | Freq: Two times a day (BID) | ORAL | Status: DC
  Filled 2021-12-27: qty 1

## 2021-12-27 MED ORDER — MAGNESIUM SULFATE 2 GM/50ML IV SOLN
2.0000 g | Freq: Once | INTRAVENOUS | Status: AC
  Administered 2021-12-28: 2 g via INTRAVENOUS
  Filled 2021-12-27: qty 50

## 2021-12-27 MED ORDER — POTASSIUM CHLORIDE 10 MEQ/100ML IV SOLN
10.0000 meq | INTRAVENOUS | Status: AC
  Administered 2021-12-28 (×4): 10 meq via INTRAVENOUS
  Filled 2021-12-27 (×4): qty 100

## 2021-12-27 MED ORDER — TICAGRELOR 90 MG PO TABS
180.0000 mg | ORAL_TABLET | Freq: Once | ORAL | Status: AC
  Administered 2021-12-27: 180 mg via NASOGASTRIC

## 2021-12-27 NOTE — Consult Note (Signed)
Chief Complaint: Patient was seen in consultation today for stroke symptoms at the request of Dr. Leonie Man  Referring Physician(s): Dr. Leonie Man  Supervising Physician: Luanne Bras  Patient Status: [facility] - In-pt  History of Present Illness: Wanda [name] is a 81 y.o. female with PMH of DVT on Eliquis, recent stoke in 07/2021, HTN, and HDL.  She presented to ED due to left-sided weakness, mumbling, and confusion.  She resides alone in an assisted living community and last seen to be at baseline on Friday.  Her Plavix was recently discontinued when diagnosed with DVT of the left CFV and Eliquis initiated.   CT in ED revealed subacute right frontal lobe CVA. MR suggests severe stenosis of the right ICA.  Dr Leonie Man consults NIR for angiogram.  Past Medical History:  Diagnosis Date   Arthritis    Carotid stenosis 11/2018   Cataract    surgery to remove   Diabetes mellitus    type 2   Hyperlipidemia    Hypertension    Skin cancer    Removed from face   Stroke [facility]) 09/19/2018   Urgency of urination    Urinary leakage     Past Surgical History:  Procedure Laterality Date   ABDOMINAL HYSTERECTOMY  early 80's   total   CAROTID ENDARTERECTOMY Left 11/13/2018   CARPAL TUNNEL RELEASE Bilateral 1980 and 1981   both hands   ENDARTERECTOMY Left 11/13/2018   Procedure: Left Carotid Artery Endarterectomy with Patch Angioplasty;  Surgeon: Rosetta Posner, MD;  Location: [facility] OR;  Service: Vascular;  Laterality: Left;   EYE SURGERY     bilateral cataracts   Fatty Tumor Excision     JOINT REPLACEMENT     TONSILLECTOMY     TONSILLECTOMY AND ADENOIDECTOMY  age 24   TOTAL KNEE ARTHROPLASTY Right 10/04/2015   TOTAL KNEE ARTHROPLASTY Right 10/04/2015   Procedure: TOTAL KNEE ARTHROPLASTY;  Surgeon: Dorna Leitz, MD;  Location: [place];  Service: Orthopedics;  Laterality: Right;   TOTAL KNEE ARTHROPLASTY Left 01/28/2016   Procedure: TOTAL KNEE ARTHROPLASTY;  Surgeon: Dorna Leitz, MD;  Location:  [place];  Service: Orthopedics;  Laterality: Left;   TUBAL LIGATION      Allergies: Ace inhibitors, Alendronate sodium, Aspirin, Crestor [rosuvastatin], Lipitor [atorvastatin calcium], Metformin and related, Shellfish allergy, and Statins  Medications: Prior to Admission medications   Medication Sig Start Date End Date Taking? Authorizing Provider  acetaminophen (TYLENOL) 325 MG tablet Take 650 mg by mouth in the morning and at bedtime.    [provider]  amLODipine (NORVASC) 5 MG tablet TAKE 1 TABLET(5 MG) BY MOUTH DAILY Patient taking differently: Take 5 mg by mouth daily. 03/16/21   Copland, Gay Filler, MD  apixaban (ELIQUIS) 5 MG TABS tablet Take 1 tablet (5 mg total) by mouth 2 (two) times daily. Start on 11/30 12/08/21   Ghimire, Henreitta Leber, MD  apixaban (ELIQUIS) 5 MG TABS tablet Take 2 tablets (10 mg total) by mouth 2 (two) times daily for 7 days. 7 days from 11/23 12/02/21 12/09/21  Jonetta Osgood, MD  blood glucose meter kit and supplies Dispense based on patient and insurance preference. Pt just needs meter 02/26/17   Copland, Gay Filler, MD  Blood Glucose Monitoring Suppl (ONE TOUCH ULTRA MINI) w/Device KIT Use to test blood sugar daily as instructed. Dx: E11.65 01/26/21   Copland, Gay Filler, MD  cloNIDine (CATAPRES) 0.1 MG tablet Take 0.1 mg by mouth 2 (two) times daily.  [provider]  glipiZIDE (GLUCOTROL XL) 5 MG 24 hr tablet Take 2 tabs in the morning and 1 tab in the evening. Patient taking differently: Take 5-10 mg by mouth See admin instructions. Take 2 tabs in the morning and 1 tab in the evening. 03/16/21   Copland, Gay Filler, MD  glucose blood (ONE TOUCH ULTRA TEST) test strip Test blood sugar 3 times a day. Dx code: 250.00 03/16/21   Copland, Gay Filler, MD  glucose blood (ONETOUCH ULTRA) test strip Use as instructed 06/08/21   Copland, Gay Filler, MD  insulin aspart (NOVOLOG) 100 UNIT/ML injection Inject 3 Units into the skin 3 (three) times daily with meals.  07/29/21   Dahal, Marlowe Aschoff, MD  insulin glargine-yfgn (SEMGLEE) 100 UNIT/ML injection Inject 0.3 mLs (30 Units total) into the skin daily. 07/30/21   Dahal, Marlowe Aschoff, MD  Insulin Pen Needle (BD PEN NEEDLE NANO U/F) 32G X 4 MM MISC USE TO INJECT INSULIN ONCE DAILY 08/31/16   Copland, Gay Filler, MD  Insulin Syringe-Needle U-100 (INSULIN SYRINGE .5CC/31GX5/16") 31G X 5/16" 0.5 ML MISC Use to inject insulin 1 time daily. 09/23/14   Philemon Kingdom, MD  Lancets MISC 1 each by Does not apply route 3 (three) times daily. Dx: E11.65 01/26/21   Copland, Gay Filler, MD  senna-docusate (SENOKOT-S) 8.6-50 MG tablet Take 1 tablet by mouth at bedtime as needed for moderate constipation. Patient taking differently: Take 2 tablets by mouth every evening. 07/29/21   Dahal, Marlowe Aschoff, MD  venlafaxine XR (EFFEXOR XR) 37.5 MG 24 hr capsule Take 1 capsule (37.5 mg total) by mouth daily with breakfast. Increase to 75 mg after 1 week Patient taking differently: Take 75 mg by mouth daily with breakfast. Increase to 75 mg after 1 week 09/09/21   Copland, Gay Filler, MD  lisinopril (PRINIVIL,ZESTRIL) 20 MG tablet Take 20 mg by mouth daily.   03/23/11  [provider]     Family History  Problem Relation Age of Onset   Cancer Mother        liver   Breast cancer Mother 45   Pancreatitis Father        deceased 8   Cancer Brother        GI   Coronary artery disease Paternal Grandmother    Cancer Son        terminal kidney   Breast cancer Maternal Aunt    Cancer Other    Coronary artery disease Paternal Uncle     Social History   Socioeconomic History   Marital status: Widowed    Spouse name: Not on file   Number of children: Not on file   Years of education: Not on file   Highest education level: Not on file  Occupational History   Not on file  Tobacco Use   Smoking status: Never   Smokeless tobacco: Never  Vaping Use   Vaping Use: Never used  Substance and Sexual Activity   Alcohol use: Yes     Alcohol/week: 1.0 standard drink of alcohol    Types: 1 Standard drinks or equivalent per week    Comment: maybe once per month - 3 drinks   Drug use: No   Sexual activity: Not Currently    Birth control/protection: Post-menopausal    Comment: Hysterectomy  Other Topics Concern   Not on file  Social History Narrative   Widowed. Education: [employer]. Exercise: 2 times a week for 30 minutes.   Social Determinants of Radio broadcast assistant  Strain: Low Risk  (03/18/2020)   Overall Financial Resource Strain (CARDIA)    Difficulty of Paying Living Expenses: Not hard at all  Food Insecurity: No Food Insecurity (03/18/2020)   Hunger Vital Sign    Worried About Running Out of Food in the Last Year: Never true    [place] in the Last Year: Never true  Transportation Needs: No Transportation Needs (03/18/2020)   PRAPARE - Hydrologist (Medical): No    Lack of Transportation (Non-Medical): No  Physical Activity: Inactive (03/18/2020)   Exercise Vital Sign    Days of Exercise per Week: 0 days    Minutes of Exercise per Session: 0 min  Stress: No Stress Concern Present (03/18/2020)   [place]    Feeling of Stress : Not at all  Social Connections: Socially Isolated (03/18/2020)   Social Connection and Isolation Panel [NHANES]    Frequency of Communication with Friends and Family: More than three times a week    Frequency of Social Gatherings with Friends and Family: More than three times a week    Attends Religious Services: Never    Marine scientist or Organizations: No    Attends Archivist Meetings: Never    Marital Status: Widowed   Review of Systems: unable to obtain due to clinical status  Vital Signs: BP (!) 169/65   Pulse (!) 123   Temp 98.3 F (36.8 C) (Oral)   Resp 17   Ht _0  (1.549 m)   Wt 147 lb 11.3 oz (67 kg)   LMP  (LMP Unknown)   SpO2 95%   BMI  27.91 kg/m   Physical Exam Constitutional:      Comments: mumbling  HENT:     Head: Normocephalic and atraumatic.     Mouth/Throat:     Mouth: Mucous membranes are moist.     Pharynx: Oropharynx is clear.  Eyes:     Extraocular Movements: Extraocular movements intact.     Conjunctiva/sclera: Conjunctivae normal.  Cardiovascular:     Rate and Rhythm: Normal rate and regular rhythm.     Pulses: Normal pulses.  Pulmonary:     Effort: Pulmonary effort is normal. No respiratory distress.  Abdominal:     General: Abdomen is flat.     Palpations: Abdomen is soft.  Skin:    General: Skin is warm and dry.  Neurological:     Mental Status: She is alert.     Comments: Left facial droop, left extremities flaccid    Imaging: MR BRAIN WO CONTRAST  Result Date: 12/26/2021 CLINICAL DATA:  Stroke suspected EXAM: MRI HEAD WITHOUT CONTRAST MRA HEAD WITHOUT CONTRAST MRA NECK WITHOUT CONTRAST TECHNIQUE: Multiplanar, multi-echo pulse sequences of the brain and surrounding structures were acquired without intravenous contrast. Angiographic images of the Circle of Willis were acquired using MRA technique without intravenous contrast. Angiographic images of the neck were acquired using MRA technique without intravenous contrast. Carotid stenosis measurements (when applicable) are obtained utilizing NASCET criteria, using the distal internal carotid diameter as the denominator. COMPARISON:  07/25/2021 MRI head, no prior MRA, correlation is made with CT head 07/26/2021 and CTA head and neck 07/24/2021 FINDINGS: MRI HEAD FINDINGS Evaluation is somewhat limited by motion artifact. Brain: Restricted diffusion with ADC correlate in the right MCA territory, the largest area of which involves the posterior right frontal lobe (series 2, image 33), but with additional involvement of  the right insula (series 2, image 28), more anterior right frontal lobe (series 2, image 36), and right parietal lobe (series 3, image  12). Many of these areas are adjacent to infarcts noted on 07/25/2021. No acute hemorrhage, mass, mass effect, or midline shift. Encephalomalacia in the right parietal lobe and watershed territory, likely sequela of the 07/25/2021 infarcts. No hydrocephalus or extra-axial collection. Unchanged size and configuration of the ventricles, with ex vacuo dilatation reflecting central volume loss. Confluent T2 hyperintense signal in the periventricular white matter, likely the sequela of severe chronic small vessel ischemic disease. Vascular: Please see MRA findings below. Skull and upper cervical spine: Normal marrow signal. Sinuses/Orbits: Clear paranasal sinuses. Status post bilateral lens replacements. Other: The mastoids are well aerated. MRA HEAD FINDINGS Evaluation is limited by motion artifact. Anterior circulation: Grossly normal signal in the right ICA and proximal MCA. Poor visualization of the left ACA. Relatively poor signal in the right ICA, with no signal seen past the terminus (series 2, image 60). No definite flow signal is seen in right MCA. Possible signal in the right A1. Posterior circulation: The proximal vertebral arteries are not evaluated. The distal vertebral arteries are patent to the vertebrobasilar junction. The basilar artery is patent to the terminus. The SCA is are not well seen. Grossly normal flow in the bilateral PCAs. MRA NECK FINDINGS Evaluation is limited by motion in the absence of intravenous contrast. Aortic arch: Three-vessel arch. No evidence of aneurysm or dissection in the imaged aorta. Right carotid system: Severe stenosis at the origin of the ICA, with poor flow signal seen in the right compared to the left. On the 2D acquisition, flow signal is seen to the supraclinoid segment, but on the 3D acquisition, definitive signal is not seen past the mid right ICA. Left carotid system: Grossly patent. Vertebral arteries: Left dominant system, with relatively diminutive right  vertebral artery. Both vertebral arteries appear patent to the V3 segment. Other: None IMPRESSION: 1. Evaluation is somewhat limited by motion artifact. Within this limitation, there are acute infarcts in the right MCA territory, with the largest area of infarction in the posterior right frontal lobe, but with additional involvement of the right insula, anterior right frontal lobe, and right parietal lobe. 2. Severe stenosis at the origin of the right ICA, with poor flow signal in the mid and distal right extracranial ICA, relatively diminished signal in the right intracranial ICA, and no signal seen in the right MCA. Minimal signal in the right A1. This is poorly evaluated given the degree of motion and the degree of stenosis and could be better evaluated with a CTA head and neck. These results were called by telephone at the time of interpretation on 12/26/2021 at 7:36 pm to provider [facility] , who verbally acknowledged these results. Electronically Signed   By: Merilyn Baba M.D.   On: 12/26/2021 19:40   MR ANGIO HEAD WO CONTRAST  Result Date: 12/26/2021 CLINICAL DATA:  Stroke suspected EXAM: MRI HEAD WITHOUT CONTRAST MRA HEAD WITHOUT CONTRAST MRA NECK WITHOUT CONTRAST TECHNIQUE: Multiplanar, multi-echo pulse sequences of the brain and surrounding structures were acquired without intravenous contrast. Angiographic images of the Circle of Willis were acquired using MRA technique without intravenous contrast. Angiographic images of the neck were acquired using MRA technique without intravenous contrast. Carotid stenosis measurements (when applicable) are obtained utilizing NASCET criteria, using the distal internal carotid diameter as the denominator. COMPARISON:  07/25/2021 MRI head, no prior MRA, correlation is made with CT head 07/26/2021  and CTA head and neck 07/24/2021 FINDINGS: MRI HEAD FINDINGS Evaluation is somewhat limited by motion artifact. Brain: Restricted diffusion with ADC correlate in the  right MCA territory, the largest area of which involves the posterior right frontal lobe (series 2, image 33), but with additional involvement of the right insula (series 2, image 28), more anterior right frontal lobe (series 2, image 36), and right parietal lobe (series 3, image 12). Many of these areas are adjacent to infarcts noted on 07/25/2021. No acute hemorrhage, mass, mass effect, or midline shift. Encephalomalacia in the right parietal lobe and watershed territory, likely sequela of the 07/25/2021 infarcts. No hydrocephalus or extra-axial collection. Unchanged size and configuration of the ventricles, with ex vacuo dilatation reflecting central volume loss. Confluent T2 hyperintense signal in the periventricular white matter, likely the sequela of severe chronic small vessel ischemic disease. Vascular: Please see MRA findings below. Skull and upper cervical spine: Normal marrow signal. Sinuses/Orbits: Clear paranasal sinuses. Status post bilateral lens replacements. Other: The mastoids are well aerated. MRA HEAD FINDINGS Evaluation is limited by motion artifact. Anterior circulation: Grossly normal signal in the right ICA and proximal MCA. Poor visualization of the left ACA. Relatively poor signal in the right ICA, with no signal seen past the terminus (series 2, image 60). No definite flow signal is seen in right MCA. Possible signal in the right A1. Posterior circulation: The proximal vertebral arteries are not evaluated. The distal vertebral arteries are patent to the vertebrobasilar junction. The basilar artery is patent to the terminus. The SCA is are not well seen. Grossly normal flow in the bilateral PCAs. MRA NECK FINDINGS Evaluation is limited by motion in the absence of intravenous contrast. Aortic arch: Three-vessel arch. No evidence of aneurysm or dissection in the imaged aorta. Right carotid system: Severe stenosis at the origin of the ICA, with poor flow signal seen in the right compared to  the left. On the 2D acquisition, flow signal is seen to the supraclinoid segment, but on the 3D acquisition, definitive signal is not seen past the mid right ICA. Left carotid system: Grossly patent. Vertebral arteries: Left dominant system, with relatively diminutive right vertebral artery. Both vertebral arteries appear patent to the V3 segment. Other: None IMPRESSION: 1. Evaluation is somewhat limited by motion artifact. Within this limitation, there are acute infarcts in the right MCA territory, with the largest area of infarction in the posterior right frontal lobe, but with additional involvement of the right insula, anterior right frontal lobe, and right parietal lobe. 2. Severe stenosis at the origin of the right ICA, with poor flow signal in the mid and distal right extracranial ICA, relatively diminished signal in the right intracranial ICA, and no signal seen in the right MCA. Minimal signal in the right A1. This is poorly evaluated given the degree of motion and the degree of stenosis and could be better evaluated with a CTA head and neck. These results were called by telephone at the time of interpretation on 12/26/2021 at 7:36 pm to provider [facility] , who verbally acknowledged these results. Electronically Signed   By: Merilyn Baba M.D.   On: 12/26/2021 19:40   MR ANGIO NECK WO CONTRAST  Result Date: 12/26/2021 CLINICAL DATA:  Stroke suspected EXAM: MRI HEAD WITHOUT CONTRAST MRA HEAD WITHOUT CONTRAST MRA NECK WITHOUT CONTRAST TECHNIQUE: Multiplanar, multi-echo pulse sequences of the brain and surrounding structures were acquired without intravenous contrast. Angiographic images of the Circle of Willis were acquired using MRA technique without intravenous  contrast. Angiographic images of the neck were acquired using MRA technique without intravenous contrast. Carotid stenosis measurements (when applicable) are obtained utilizing NASCET criteria, using the distal internal carotid diameter as  the denominator. COMPARISON:  07/25/2021 MRI head, no prior MRA, correlation is made with CT head 07/26/2021 and CTA head and neck 07/24/2021 FINDINGS: MRI HEAD FINDINGS Evaluation is somewhat limited by motion artifact. Brain: Restricted diffusion with ADC correlate in the right MCA territory, the largest area of which involves the posterior right frontal lobe (series 2, image 33), but with additional involvement of the right insula (series 2, image 28), more anterior right frontal lobe (series 2, image 36), and right parietal lobe (series 3, image 12). Many of these areas are adjacent to infarcts noted on 07/25/2021. No acute hemorrhage, mass, mass effect, or midline shift. Encephalomalacia in the right parietal lobe and watershed territory, likely sequela of the 07/25/2021 infarcts. No hydrocephalus or extra-axial collection. Unchanged size and configuration of the ventricles, with ex vacuo dilatation reflecting central volume loss. Confluent T2 hyperintense signal in the periventricular white matter, likely the sequela of severe chronic small vessel ischemic disease. Vascular: Please see MRA findings below. Skull and upper cervical spine: Normal marrow signal. Sinuses/Orbits: Clear paranasal sinuses. Status post bilateral lens replacements. Other: The mastoids are well aerated. MRA HEAD FINDINGS Evaluation is limited by motion artifact. Anterior circulation: Grossly normal signal in the right ICA and proximal MCA. Poor visualization of the left ACA. Relatively poor signal in the right ICA, with no signal seen past the terminus (series 2, image 60). No definite flow signal is seen in right MCA. Possible signal in the right A1. Posterior circulation: The proximal vertebral arteries are not evaluated. The distal vertebral arteries are patent to the vertebrobasilar junction. The basilar artery is patent to the terminus. The SCA is are not well seen. Grossly normal flow in the bilateral PCAs. MRA NECK FINDINGS  Evaluation is limited by motion in the absence of intravenous contrast. Aortic arch: Three-vessel arch. No evidence of aneurysm or dissection in the imaged aorta. Right carotid system: Severe stenosis at the origin of the ICA, with poor flow signal seen in the right compared to the left. On the 2D acquisition, flow signal is seen to the supraclinoid segment, but on the 3D acquisition, definitive signal is not seen past the mid right ICA. Left carotid system: Grossly patent. Vertebral arteries: Left dominant system, with relatively diminutive right vertebral artery. Both vertebral arteries appear patent to the V3 segment. Other: None IMPRESSION: 1. Evaluation is somewhat limited by motion artifact. Within this limitation, there are acute infarcts in the right MCA territory, with the largest area of infarction in the posterior right frontal lobe, but with additional involvement of the right insula, anterior right frontal lobe, and right parietal lobe. 2. Severe stenosis at the origin of the right ICA, with poor flow signal in the mid and distal right extracranial ICA, relatively diminished signal in the right intracranial ICA, and no signal seen in the right MCA. Minimal signal in the right A1. This is poorly evaluated given the degree of motion and the degree of stenosis and could be better evaluated with a CTA head and neck. These results were called by telephone at the time of interpretation on 12/26/2021 at 7:36 pm to provider [facility] , who verbally acknowledged these results. Electronically Signed   By: Merilyn Baba M.D.   On: 12/26/2021 19:40   DG Chest 1 View  Result Date: 12/26/2021 CLINICAL  DATA:  [id number] Stroke (cerebrum) [facility]) [id number] EXAM: CHEST  1 VIEW COMPARISON:  September 19, 2018 FINDINGS: The cardiomediastinal silhouette is unchanged in contour.Atherosclerotic calcifications of the aorta. No pleural effusion. No pneumothorax. Mildly increased bronchitic markings bilaterally. No focal  consolidation. Visualized abdomen is unremarkable. IMPRESSION: Mildly increased bronchitic markings bilaterally. This is nonspecific and could reflect bronchitis or mild underlying pulmonary edema. Electronically Signed   By: Valentino Saxon M.D.   On: 12/26/2021 19:37   CT HEAD WO CONTRAST  Result Date: 12/26/2021 CLINICAL DATA:  Neuro deficit, acute, stroke suspected EXAM: CT HEAD WITHOUT CONTRAST TECHNIQUE: Contiguous axial images were obtained from the base of the skull through the vertex without intravenous contrast. RADIATION DOSE REDUCTION: This exam was performed according to the departmental dose-optimization program which includes automated exposure control, adjustment of the mA and/or kV according to patient size and/or use of iterative reconstruction technique. COMPARISON:  CT November 30, 2021. FINDINGS: Brain: New/interval infarct in the posterior right frontal lobe, potentially acute. No evidence of acute hemorrhage, hydrocephalus, extra-axial collection or mass lesion/mass effect. Additional remote right frontal and parietal infarcts. Additional patchy white matter hypodensities, nonspecific but compatible with chronic microvascular ischemic disease. Vascular: No hyperdense vessel.  Calcific atherosclerosis. Skull: No acute fracture. Sinuses/Orbits: No acute finding. Other: No mastoid effusions. IMPRESSION: New/interval infarct in the posterior right frontal lobe, potentially acute. Recommend MRI for further evaluation. Electronically Signed   By: Margaretha Sheffield M.D.   On: 12/26/2021 15:01   VAS [place] LOWER EXTREMITY VENOUS (DVT)  Result Date: 12/03/2021  Lower Venous DVT Study Patient Name:  Myliah MARIE Smitherman  Date of Exam:   12/02/2021 Medical Rec #: [id number]            Accession #:    [id number] Date of Birth: 09-Sep-1940             Patient Gender: F Patient Age:   60 years Exam Location:  [facility] Procedure:      VAS [place] LOWER EXTREMITY VENOUS (DVT) Referring Phys: Oren Binet --------------------------------------------------------------------------------  Indications: DVT seen on CT.  Comparison Study: no prior Performing Technologist: Archie Patten RVS  Examination Guidelines: A complete evaluation includes B-mode imaging, spectral Doppler, color Doppler, and power Doppler as needed of all accessible portions of each vessel. Bilateral testing is considered an integral part of a complete examination. Limited examinations for reoccurring indications may be performed as noted. The reflux portion of the exam is performed with the patient in reverse Trendelenburg.  +---------+---------------+---------+-----------+----------+--------------+ RIGHT    CompressibilityPhasicitySpontaneityPropertiesThrombus Aging +---------+---------------+---------+-----------+----------+--------------+ CFV      Full           Yes      Yes                                 +---------+---------------+---------+-----------+----------+--------------+ SFJ      Full                                                        +---------+---------------+---------+-----------+----------+--------------+ FV Prox  Full                                                        +---------+---------------+---------+-----------+----------+--------------+  FV Mid   Full                                                        +---------+---------------+---------+-----------+----------+--------------+ FV DistalFull                                                        +---------+---------------+---------+-----------+----------+--------------+ PFV      Full                                                        +---------+---------------+---------+-----------+----------+--------------+ POP      Full           Yes      Yes                                 +---------+---------------+---------+-----------+----------+--------------+ PTV      Full                                                         +---------+---------------+---------+-----------+----------+--------------+ PERO     Full           Yes      Yes                                 +---------+---------------+---------+-----------+----------+--------------+   +--------+---------------+---------+-----------+----------+--------------------+ LEFT    CompressibilityPhasicitySpontaneityPropertiesThrombus Aging       +--------+---------------+---------+-----------+----------+--------------------+ CFV     Partial        Yes      Yes                  Age Indeterminate    +--------+---------------+---------+-----------+----------+--------------------+ SFJ     None                                         Age Indeterminate    +--------+---------------+---------+-----------+----------+--------------------+ FV Prox Partial        Yes      Yes                  Age Indeterminate    +--------+---------------+---------+-----------+----------+--------------------+ FV Mid  Partial        Yes      Yes                  Age Indeterminate    +--------+---------------+---------+-----------+----------+--------------------+ FV      Partial        Yes      Yes                  Age Indeterminate    Distal                                                                    +--------+---------------+---------+-----------+----------+--------------------+  PFV     Full                                                              +--------+---------------+---------+-----------+----------+--------------------+ POP     Partial        Yes      Yes                  Age Indeterminate    +--------+---------------+---------+-----------+----------+--------------------+ PTV                    Yes      Yes                  patent by color                                                           doppler               +--------+---------------+---------+-----------+----------+--------------------+ PERO                                                 Not well visualized  +--------+---------------+---------+-----------+----------+--------------------+ EIV                    Yes      Yes                  patent by color                                                           doppler              +--------+---------------+---------+-----------+----------+--------------------+     Summary: RIGHT: - There is no evidence of deep vein thrombosis in the lower extremity.  - No cystic structure found in the popliteal fossa.  LEFT: - Findings consistent with age indeterminate deep vein thrombosis involving the left common femoral vein, SF junction, left femoral vein, and left popliteal vein. - No cystic structure found in the popliteal fossa.  *See table(s) above for measurements and observations. Electronically signed by Harold Barban MD on 12/03/2021 at 12:10:59 AM.    Final    CT ABDOMEN PELVIS W CONTRAST  Result Date: 11/30/2021 CLINICAL DATA:  Blunt abdominal trauma. EXAM: CT ABDOMEN AND PELVIS WITH CONTRAST TECHNIQUE: Multidetector CT imaging of the abdomen and pelvis was performed using the standard protocol following bolus administration of intravenous contrast. RADIATION DOSE REDUCTION: This exam was performed according to the departmental dose-optimization program which includes automated exposure control, adjustment of the mA and/or kV according to patient size and/or use of iterative reconstruction technique. CONTRAST:  67m OMNIPAQUE IOHEXOL 350 MG/ML SOLN COMPARISON:  None Available. FINDINGS: Lower chest: The visualized lung bases are clear. There is 3 vessel coronary vascular  calcification. There is calcification of the mitral annulus. No intra-abdominal free air or free fluid. Hepatobiliary: The liver is unremarkable. No biliary dilatation. The gallbladder is unremarkable. Pancreas: 2 small cystic  lesions in the body of the pancreas measuring up to 15 mm are not characterized on this CT but likely represents a side branch IPMN. Further characterization with MRI without and with contrast on a nonemergent/outpatient basis recommended. No dilatation of the main pancreatic duct or gland atrophy. Spleen: Normal in size without focal abnormality. Adrenals/Urinary Tract: The adrenal glands are unremarkable. Small left renal interpolar cyst. No follow-up by imaging. There is no hydronephrosis on either side. There is symmetric enhancement and excretion of contrast by both kidneys. The visualized ureters and urinary bladder appear unremarkable. Stomach/Bowel: There is no bowel obstruction or active inflammation. The appendix is normal. Vascular/Lymphatic: Moderate aortoiliac atherosclerotic disease. There is DVT extending from the visualized left common femoral vein into the left common iliac vein. Faint hypodensity in the right common femoral vein may be artifactual and related to mixing of contrast or represent additional DVT. The IVC is unremarkable. There is mild compression of the left common iliac vein between the spine and right common iliac artery. No portal venous gas. There is no adenopathy. Reproductive: Hysterectomy.  No adnexal masses. Other: None Musculoskeletal: Osteopenia with degenerative changes of the spine. Grade 1 L4-L5 anterolisthesis. T11 hemangioma. No acute osseous pathology. IMPRESSION: 1. No acute intra-abdominal or pelvic pathology. 2. DVT extending from the visualized left common femoral vein into the left common iliac vein. 3. Small cystic lesions in the body of the pancreas as above, likely represents a side branch IPMN. Further characterization with MRI without and with contrast on a nonemergent/outpatient basis recommended. 4.  Aortic Atherosclerosis (ICD10-I70.0). These results were called by telephone at the time of interpretation on 11/30/2021 at 9:12 pm to provider [facility] ,  who verbally acknowledged these results. Electronically Signed   By: Anner Crete M.D.   On: 11/30/2021 21:13   DG Pelvis Portable  Result Date: 11/30/2021 CLINICAL DATA:  Level 2 trauma due to a fall. EXAM: PORTABLE PELVIS 1-2 VIEWS COMPARISON:  None Available. FINDINGS: Degenerative changes in the lower lumbar spine and hips. Pelvis appears intact. No acute displaced fractures identified. SI joints and symphysis pubis are nondisplaced. Visualized sacrum appears intact. IMPRESSION: No acute bony abnormalities. Electronically Signed   By: Lucienne Capers M.D.   On: 11/30/2021 19:43   DG Chest Portable 1 View  Result Date: 11/30/2021 CLINICAL DATA:  Level 2 trauma due to a fall. EXAM: PORTABLE CHEST 1 VIEW COMPARISON:  07/25/2021 FINDINGS: Shallow inspiration. Heart size and pulmonary vascularity are normal. Lungs are clear. No pleural effusions. No pneumothorax. Calcified aorta. Degenerative changes in the spine and shoulders. IMPRESSION: Shallow inspiration.  No evidence of active pulmonary disease. Electronically Signed   By: Lucienne Capers M.D.   On: 11/30/2021 19:43   CT Cervical Spine Wo Contrast  Result Date: 11/30/2021 CLINICAL DATA:  Neck trauma (Age >= 65y).  Fall. EXAM: CT CERVICAL SPINE WITHOUT CONTRAST TECHNIQUE: Multidetector CT imaging of the cervical spine was performed without intravenous contrast. Multiplanar CT image reconstructions were also generated. RADIATION DOSE REDUCTION: This exam was performed according to the departmental dose-optimization program which includes automated exposure control, adjustment of the mA and/or kV according to patient size and/or use of iterative reconstruction technique. COMPARISON:  None Available. FINDINGS: Alignment: Normal Skull base and vertebrae: No acute fracture. No primary bone lesion or focal pathologic  process. Soft tissues and spinal canal: No prevertebral fluid or swelling. No visible canal hematoma. Disc levels: Diffuse moderate  degenerative disc disease and facet disease. No disc herniation. Upper chest: No acute findings Other: Bilateral thyroid nodules, the largest in the right thyroid lobe appears to be solid and measures 3 cm. IMPRESSION: Degenerative disc and facet disease.  No acute bony abnormality. Multiple bilateral thyroid nodules, the largest 3 cm in the right thyroid lobe. Recommend non emergent thyroid US (ref: J Am Coll Radiol. 2015 Feb;12(2): 143-50). Electronically Signed   By: Rolm Baptise M.D.   On: 11/30/2021 19:37   CT HEAD WO CONTRAST (5MM)  Result Date: 11/30/2021 CLINICAL DATA:  Head trauma, minor (Age >= 65y).  Fall EXAM: CT HEAD WITHOUT CONTRAST TECHNIQUE: Contiguous axial images were obtained from the base of the skull through the vertex without intravenous contrast. RADIATION DOSE REDUCTION: This exam was performed according to the departmental dose-optimization program which includes automated exposure control, adjustment of the mA and/or kV according to patient size and/or use of iterative reconstruction technique. COMPARISON:  07/24/2021 FINDINGS: Brain: There is atrophy and chronic small vessel disease changes. 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 Old frontal parietal infarct. No acute intracranial abnormality. Specifically, no hemorrhage, hydrocephalus, mass lesion, acute infarction, or significant intracranial injury. Vascular: No hyperdense vessel or unexpected calcification. Skull: No acute calvarial abnormality. 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 Atrophy, chronic microvascular disease. No acute intracranial abnormality. Electronically Signed   By: Rolm Baptise M.D.   On: 11/30/2021 19:34    Labs:  CBC: Recent Labs    11/30/21 1906 12/01/21 0349 12/26/21 1435 12/26/21 1455 12/27/21 0644  WBC 7.4 6.9 12.8*  --  14.1*  HGB 13.5 13.6 14.7 14.6 12.3  HCT 39.9 39.5  46.2* 43.0 35.9*  PLT 236 225 274  --  291    COAGS: Recent Labs    07/24/21 1122 12/26/21 1435 12/27/21 0644  INR 1.0 1.3*  --   APTT 22* 29 84*    BMP: Recent Labs    07/29/21 0547 11/30/21 1906 12/01/21 0653 12/26/21 1435 12/26/21 1455  NA 140 134* 138 137 138  K 3.7 4.0 4.1 3.5 4.7  CL 107 100 105 102 105  CO2 23 21* 23 21*  --   GLUCOSE 211* 362* 303* 113* 110*  BUN 24* 9 6* 14 20  CALCIUM 9.8 9.7 9.2 10.1  --   CREATININE 0.87 0.99 0.71 0.78 0.60  GFRNONAA >60 57* >60 >60  --     LIVER FUNCTION TESTS: Recent Labs    07/27/21 0424 11/30/21 1906 12/01/21 0653 12/26/21 1435  BILITOT 0.9 1.0 1.2 0.9  AST 34 175* 54* 20  ALT 41 110* 71* 13  ALKPHOS 42 89 80 76  PROT 6.4* 7.2 6.3* 7.8  ALBUMIN 3.2* 4.0 3.4* 4.0    Assessment and Plan:  Stroke R ICA Stenosis on 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  Case reviewed and approved by Dr. Estanislado Pandy.  Due to Eliquis dosing, will plan for case tor 12/20.  Pt will be made NPO. Heparin gtt ok.  Daughter in agreement.  Upon further discussion between Dr. Estanislado Pandy and Dr. Leonie Man, intervention would be considered optimal during same case as risk for future additional strokes is considered almost certain.  Brilinta to be started today. Unable to begin ASA due to allergy.  Dr. Leonie Man to discuss need for revascularization with daughter, and consent  will need to be re-obtained.  Thank you for this interesting consult.  I greatly enjoyed meeting Azarria Balint and look forward to participating in their care.  A copy of this report was sent to the requesting provider on this date.  Electronically Signed: Pasty Spillers, PA 12/27/2021, 10:12 AM   I spent a total of 40 Minutes in face to face in clinical consultation, greater than 50% of which was counseling/coordinating care for R ICA stenosis

## 2021-12-27 NOTE — Progress Notes (Signed)
Patient examined with Dr. Estanislado Pandy.  Daughter Tye [place] at bedside.  Discussion had with new plan of possible intervention for revascularization.  Procedure, risks, benefits, and alternatives discussed in detail.  Daughter is in agreement to proceed. Procedure scheduled with General Anesthesia for  12/28/21 at 0830.  Consent in IR control room.  Electronically Signed: Pasty Spillers, PA-C 12/27/2021, 2:38 PM

## 2021-12-27 NOTE — Progress Notes (Signed)
PROGRESS NOTE    [name]  BSJ:[id number] DOB: 1940-05-02 DOA: 12/26/2021 PCP: Darreld Mclean, MD   Brief Narrative:  HPI per Dr. Wynetta Fines on 12/26/21  Wanda [name] is a 81 y.o. female with medical history significant of recent diagnosed DVT on Eliquis, recent stroke in July 2023 on Plavix, HTN, HLD, brought in by daughter for evaluation of worsening left-sided weakness.   Patient lives by herself in the assisted living environment.  Patient was last seen normal on Friday.  Yesterday, daughter called patient and found she was only mumbling and appeared to be confused.  She continued to see her and found she has a new weakness on the left side which she reported has had it the weakness for at least 1+ day.  This morning, the patient became flaccid on the left side also developed a left facial droop which is new.  Her speech is not improving and she has been very sleepy since yesterday afternoon.  Patient was found to have DVT in left common femoral vein, and patient was started on Eliquis and Plavix was discontinued. ED Course: Tachycardia, blood pressure elevated, mentation remained lethargic, and CT head without contrast showed subacute right frontal lobe CVA.   **Interim History Patient undergoing stroke workup and she continues to have significant weakness.  She was out of the TNK window as well as mechanical thrombectomy and further workup showed that the MRI showed restricted diffusion in right MCA territory.  MRA of the head and neck showed severe stenosis at the origin of the right ICA and after further discussion with the neurologist they recommendeding interventional neuroradiology evaluation and the case was reviewed by Dr. Rosana Hoes for and given her Eliquis dosing they are planning for intervention on 12/28/2021 and recommending Brilinta be started as unable to begin aspirin due to an allergy.  The neuroradiology team requested that she receive her Brilinta but she is  n.p.o. and so a core track had been ordered but was not placed by dietary so an NG tube will need to be placed for her to receive the Brilinta as a loading dose.  PT and OT to further evaluate and treat.  Further stroke workup is underway and she had an echo as well as carotid Dopplers obtained  Assessment and Plan:  Right frontal stroke, subacute -With significant left-sided paralysis, left facial droop and aphasia and also possible dysphagia -Out of window for TNK and patient on Eliquis has been held now for a washout given that she will be getting neurointerventional radiology evaluation and possible stenting -Overall prognosis poor given the significant impaired and continuous worsening neurological function.  Daughter made aware at bedside, likelihood of being disabled from the stroke is high and full recovery probably of significant difficulty.  Daughter/POA agreed with DNR, and in the next few days, PT evaluation speech evaluation, then if no significant improvement, daughter agrees with putative palliative care consultation in the next 48-72 hours. -Discussed with on-call neurology, recommended resume systemic anticoagulation, as patient is n.p.o. right now, will start patient on heparin drip and continue -Continue Plavix when able to take PO however this will be changed to Brilinta per neurointerventional radiology -Stroke workup being initiated and she had a MRA of the head and neck as well as MRI of the brain -MRI's showed "Evaluation is somewhat limited by motion artifact. Within this limitation, there are acute infarcts in the right MCA territory,with the largest area of infarction in the posterior right frontal lobe, but  with additional involvement of the right insula, anterior right frontal lobe, and right parietal lobe. Severe stenosis at the origin of the right ICA, with poor flow signal in the mid and distal right extracranial ICA, relatively diminished signal in the right intracranial  ICA, and no signal seen in the right MCA. Minimal signal in the right A1. This is poorly evaluated given the degree of motion and the degree of stenosis and could be better evaluated with a CTA head and neck." -ECHO done and showed "Evaluation is somewhat limited by motion artifact. Within this limitation, there are acute infarcts in the right MCA territory, with the largest area of infarction in the posterior right frontal lobe, but with additional involvement of the right insula, anterior right frontal lobe, and right parietal lobe. Severe stenosis at the origin of the right ICA, with poor flow signal in the mid and distal right extracranial ICA, relatively diminished signal in the right intracranial ICA, and no signal seen in the right MCA. Minimal signal in the right A1. This is poorly evaluated given the degree of motion and the degree of stenosis and could be better evaluated with a CTA head and neck." -Carotid U/S done and showed" Right Carotid: Velocities in the right ICA are consistent with a 80-99%                 stenosis. The ECA appears >50% stenosed. Incidental: Large                 thyroid nodule with internal calcifications noted.   Left Carotid: Velocities in the left ICA are consistent with a 1-39%  stenosis.               Incidental: Multiple thyroid nodules, some with internal                calcifications noted.   Vertebrals:  Bilateral vertebral arteries demonstrate antegrade flow.  Subclavians: Right subclavian artery flow was disturbed. Normal flow               hemodynamics were seen in the left subclavian artery. " -A1 and lipid panel -Neurology evaluating and recommending getting the neurointerventional radiologist involved and Dr. Rosana Hoes for was called by Dr. Leonie Man and the plan is to try and revascularize with right carotid stenting we will continue to hold Eliquis and continue IV heparin. -Neurointerventional radiology recommending Brilinta load however she is n.p.o. and  core track was ordered but not placed so had to have a [place] tube placed for Brilinta load   Acute metabolic encephalopathy, POE=42 -Probably related to large stroke on right frontal lobe -Other DDx, UA pending, low suspicion for seizure at this point   Thyroid Nodules -Noted on Carotid U/S -Had some Calcifications -Outpatient workup  Leukocytosis -Daughter reported that yesterday afternoon patient has had occasional dry cough, given the stroke and possible dysphagia, check checks x-ray. No fever, hold off ABX. -UA pending still -WBC went from 12.8 -> 14.1   HTN -NPO for now -Hold off home BP meds -PRN Hydralazine for now  Hypomag HypoK+ -Mag was 1.5 and K+ was 3.4 -Replete IV -Continue to Monitor and Replete as Necessary -Repeat CMP in the AM    IDDM -Hold off home insulin regimen as she is NPO -Sliding scale for now.   Recent DVT -On stroke, Discuss with Neuro about Anticoagulation change on discharge if able to swallow -On Heparin gtt   Recent stroke -Stroke in July on the right MCA territory  and patient was started on Plavix discharge (ASA allergy).  CT angiogram at that time showed a branch of ACA stenosis, relation to today's stroke is unknown. -See Above   DVT prophylaxis: Anticoag with heparin drip    Code Status: DNR Family Communication: No family currently at bedside  Disposition Plan:  Level of care: Telemetry Medical Status is: Inpatient Remains inpatient appropriate because: Needs further clinical workup and clearance by specialists   Consultants:  Neurology Neurointerventional radiology  Procedures:  As delineated as above  Antimicrobials:  Anti-infectives (From admission, onward)    None       Subjective: Seen and examined at bedside she continues to have some left-sided hemiplegia and flaccidity.  Unable to really speak however is able to follow commands.  She appears a little drowsy but is easily aroused and speech is  dysarthric.  Objective: Vitals:   12/27/21 1300 12/27/21 1347 12/27/21 1645 12/27/21 2148  BP: (!) 147/66 (!) 156/62 (!) 151/67 (!) 175/75  Pulse: (!) 116 (!) 114 (!) 116 (!) 115  Resp:  '18 19 18  '$ Temp:  100 F (37.8 C) 98.4 F (36.9 C) 98.6 F (37 C)  TempSrc:   Oral Oral  SpO2: 91% 94% 95% 95%  Weight:      Height:       No intake or output data in the 24 hours ending 12/27/21 2155 Filed Weights   12/26/21 1800  Weight: 67 kg   Examination: Physical Exam:  Constitutional: Overweight elderly chronically ill-appearing Caucasian female who is dysarthric and has some left-sided hemiplegia Respiratory: Diminished to auscultation bilaterally, no wheezing, rales, rhonchi or crackles. Normal respiratory effort and patient is not tachypenic. No accessory muscle use.  Unlabored breathing Cardiovascular: RRR, no murmurs / rubs / gallops. S1 and S2 auscultated. No extremity edema. Abdomen: Soft, non-tender, distended secondary to body habitus. Bowel sounds positive.  GU: Deferred. Musculoskeletal: No clubbing / cyanosis of digits/nails. No joint deformity upper and lower extremities.  Skin: No rashes, lesions, ulcers limited skin evaluation. No induration; Warm and dry.  Neurologic: She is only dysarthric and has flaccidity on the left side and moves her right arm spontaneously Psychiatric: She is little somnolent and drowsy but able to follow commands and appears slightly anxious  Data Reviewed: I have personally reviewed following labs and imaging studies  CBC: Recent Labs  Lab 12/26/21 1435 12/26/21 1455 12/27/21 0644  WBC 12.8*  --  14.1*  NEUTROABS 10.3*  --   --   HGB 14.7 14.6 12.3  HCT 46.2* 43.0 35.9*  MCV 94.7  --  90.7  PLT 274  --  081   Basic Metabolic Panel: Recent Labs  Lab 12/26/21 1435 12/26/21 1455 12/27/21 0645  NA 137 138 139  K 3.5 4.7 3.4*  CL 102 105 106  CO2 21*  --  19*  GLUCOSE 113* 110* 172*  BUN '14 20 15  '$ CREATININE 0.78 0.60 0.78   CALCIUM 10.1  --  9.1  MG  --   --  1.5*  PHOS  --   --  3.0   GFR: Estimated Creatinine Clearance: 48.3 mL/min (by C-G formula based on SCr of 0.78 mg/dL). Liver Function Tests: Recent Labs  Lab 12/26/21 1435 12/27/21 0645  AST 20 16  ALT 13 12  ALKPHOS 76 64  BILITOT 0.9 1.1  PROT 7.8 6.4*  ALBUMIN 4.0 3.2*   No results for input(s): "LIPASE", "AMYLASE" in the last 168 hours. No results for input(s): "AMMONIA" in  the last 168 hours. Coagulation Profile: Recent Labs  Lab 12/26/21 1435  INR 1.3*   Cardiac Enzymes: No results for input(s): "CKTOTAL", "CKMB", "CKMBINDEX", "TROPONINI" in the last 168 hours. BNP (last 3 results) No results for input(s): "PROBNP" in the last 8760 hours. HbA1C: No results for input(s): "HGBA1C" in the last 72 hours. CBG: Recent Labs  Lab 12/27/21 0748 12/27/21 1224 12/27/21 1751 12/27/21 1950  GLUCAP 202* 211* 167* 174*   Lipid Profile: Recent Labs    12/27/21 0645  CHOL 175  HDL 37*  LDLCALC 120*  TRIG 92  CHOLHDL 4.7   Thyroid Function Tests: No results for input(s): "TSH", "T4TOTAL", "FREET4", "T3FREE", "THYROIDAB" in the last 72 hours. Anemia Panel: No results for input(s): "VITAMINB12", "FOLATE", "FERRITIN", "TIBC", "IRON", "RETICCTPCT" in the last 72 hours. Sepsis Labs: No results for input(s): "PROCALCITON", "LATICACIDVEN" in the last 168 hours.  No results found for this or any previous visit (from the past 240 hour(s)).   Radiology Studies: DG Abd 1 View  Result Date: 12/27/2021 CLINICAL DATA:  NG tube placement EXAM: ABDOMEN - 1 VIEW COMPARISON:  CT 11/30/2021 FINDINGS: Esophageal tube tip overlies the proximal to mid stomach. Nonobstructed gas pattern. IMPRESSION: Esophageal tube tip overlies the proximal to mid stomach. Electronically Signed   By: Donavan Foil M.D.   On: 12/27/2021 20:26   VAS US CAROTID  Result Date: 12/27/2021 Carotid Arterial Duplex Study Patient Name:  Anisa MARIE Lyford  Date of  Exam:   12/27/2021 Medical Rec #: [id number]            Accession #:    [id number] Date of Birth: July 04, 1940             Patient Gender: F Patient Age:   60 years Exam Location:  [facility] Procedure:      VAS US CAROTID Referring Phys: PRAMOD SETHI --------------------------------------------------------------------------------  Indications:       CVA. Limitations        Today's exam was limited due to patient somnolence, snoring,                    coughing, and grabbing technologist hand/probe. Comparison Study:  Multiple prior imaging studies. MRA Neck 12/26/21                    demonstrated severe stenosis at the right ICA origin with                    poor flow signal in the right as compared to left.                    CTA Neck 08/03/21 demonstrated severe right ICA stenosis                    approaching string sign.                    Carotid Duplex 07/26/21 demonstrated flow velocities                    consistent with 40-59% by EDV; however, 60-79% by ICA/CCA                    ratio, possibly higher in the setting of PSV of 383 cm/s. Performing Technologist: Darlin Coco RDMS, RVT  Examination Guidelines: A complete evaluation includes B-mode imaging, spectral Doppler, color Doppler, and power Doppler as needed of all accessible portions  of each vessel. Bilateral testing is considered an integral part of a complete examination. Limited examinations for reoccurring indications may be performed as noted.  Right Carotid Findings: +----------+--------+--------+--------+------------------+-------------------+           PSV cm/sEDV cm/sStenosisPlaque DescriptionComments            +----------+--------+--------+--------+------------------+-------------------+ CCA Prox  59      7                                                     +----------+--------+--------+--------+------------------+-------------------+ CCA Distal59      7                                                      +----------+--------+--------+--------+------------------+-------------------+ ICA Prox  492     143     80-99%  calcific                              +----------+--------+--------+--------+------------------+-------------------+ ICA Mid   50      8                                                     +----------+--------+--------+--------+------------------+-------------------+ ICA Distal                                          Unable to visualize +----------+--------+--------+--------+------------------+-------------------+ ECA       393     13      >50%    calcific                              +----------+--------+--------+--------+------------------+-------------------+ +----------+--------+-------+---------+-------------------+           PSV cm/sEDV cmsDescribe Arm Pressure (mmHG) +----------+--------+-------+---------+-------------------+ Subclavian220            Turbulent                    +----------+--------+-------+---------+-------------------+ +---------+--------+--+--------+-+---------+ VertebralPSV cm/s70EDV cm/s9Antegrade +---------+--------+--+--------+-+---------+  Left Carotid Findings: +----------+--------+--------+--------+------------------+--------+           PSV cm/sEDV cm/sStenosisPlaque DescriptionComments +----------+--------+--------+--------+------------------+--------+ CCA Prox  121     15                                         +----------+--------+--------+--------+------------------+--------+ CCA Distal156     15              calcific                   +----------+--------+--------+--------+------------------+--------+ ICA Prox  77      15      1-39%                              +----------+--------+--------+--------+------------------+--------+  ICA Mid   90      19                                         +----------+--------+--------+--------+------------------+--------+ ICA Distal87      17                                          +----------+--------+--------+--------+------------------+--------+ ECA       158     18                                         +----------+--------+--------+--------+------------------+--------+ +----------+--------+--------+----------------+-------------------+           PSV cm/sEDV cm/sDescribe        Arm Pressure (mmHG) +----------+--------+--------+----------------+-------------------+ [id number]             Multiphasic, WNL                    +----------+--------+--------+----------------+-------------------+ +---------+--------+--+--------+--+---------+ VertebralPSV cm/s80EDV cm/s17Antegrade +---------+--------+--+--------+--+---------+   Summary: Right Carotid: Velocities in the right ICA are consistent with a 80-99%                stenosis. The ECA appears >50% stenosed. Incidental: Large                thyroid nodule with internal calcifications noted. Left Carotid: Velocities in the left ICA are consistent with a 1-39% stenosis.               Incidental: Multiple thyroid nodules, some with internal               calcifications noted. Vertebrals:  Bilateral vertebral arteries demonstrate antegrade flow. Subclavians: Right subclavian artery flow was disturbed. Normal flow              hemodynamics were seen in the left subclavian artery. *See table(s) above for measurements and observations.  Electronically signed by Antony Contras MD on 12/27/2021 at 12:37:34 PM.    Final    ECHOCARDIOGRAM COMPLETE  Result Date: 12/27/2021    ECHOCARDIOGRAM REPORT   Patient Name:   Monquie MARIE Senat Date of Exam: 12/27/2021 Medical Rec #:  [id number]           Height:       61.0 in Accession #:    [id number]          Weight:       147.7 lb Date of Birth:  27-Aug-1940            BSA:          1.660 m Patient Age:    44 years            BP:           158/57 mmHg Patient Gender: F                   HR:           123 bpm. Exam Location:  Inpatient Procedure:  2D Echo, Cardiac Doppler and Color Doppler Indications:    Stroke I63.9  History:        Patient has prior history of Echocardiogram examinations, most  recent 07/25/2021. Carotid Disease, Signs/Symptoms:Altered mental                 status unable to follow commands.; Risk Factors:Hypertension,                 Diabetes and Dyslipidemia.  Sonographer:    Darlina Sicilian RDCS Referring Phys: [id number] [place]  1. Peak intra-cavitary gradient 28 mmHg at rest due to hyperdynamic LV function. No mitral valve SAM or outflow tract obstruction.     Left ventricular ejection fraction, by estimation, is >75%. The left ventricle has hyperdynamic function. The left ventricle has no regional wall motion abnormalities. There is mild asymmetric left ventricular hypertrophy of the basal-septal segment.  Left ventricular diastolic parameters are consistent with Grade I diastolic dysfunction (impaired relaxation).  2. Right ventricular systolic function is hyperdynamic. The right ventricular size is normal. Tricuspid regurgitation signal is inadequate for assessing PA pressure.  3. The mitral valve is grossly normal. No evidence of mitral valve regurgitation. No evidence of mitral stenosis.  4. The aortic valve is tricuspid. Aortic valve regurgitation is not visualized. No aortic stenosis is present.  5. The inferior vena cava is normal in size with greater than 50% respiratory variability, suggesting right atrial pressure of 3 mmHg. Comparison(s): No significant change from prior study. FINDINGS  Left Ventricle: Peak intra-cavitary gradient 28 mmHg at rest due to hyperdynamic LV function. No mitral valve SAM or outflow tract obstruction. Left ventricular ejection fraction, by estimation, is >75%. The left ventricle has hyperdynamic function. The  left ventricle has no regional wall motion abnormalities. The left ventricular internal cavity size was small. There is mild asymmetric left ventricular  hypertrophy of the basal-septal segment. Indeterminate diastolic filling due to E-A fusion. Right Ventricle: The right ventricular size is normal. No increase in right ventricular wall thickness. Right ventricular systolic function is hyperdynamic. Tricuspid regurgitation signal is inadequate for assessing PA pressure. Left Atrium: Left atrial size was normal in size. Right Atrium: Right atrial size was normal in size. Pericardium: Trivial pericardial effusion is present. The pericardial effusion is surrounding the apex. Mitral Valve: The mitral valve is grossly normal. Mild mitral annular calcification. No evidence of mitral valve regurgitation. No evidence of mitral valve stenosis. Tricuspid Valve: The tricuspid valve is grossly normal. Tricuspid valve regurgitation is trivial. No evidence of tricuspid stenosis. Aortic Valve: The aortic valve is tricuspid. Aortic valve regurgitation is not visualized. No aortic stenosis is present. Pulmonic Valve: The pulmonic valve was grossly normal. Pulmonic valve regurgitation is not visualized. No evidence of pulmonic stenosis. Aorta: The aortic root is normal in size and structure. Venous: The inferior vena cava is normal in size with greater than 50% respiratory variability, suggesting right atrial pressure of 3 mmHg. IAS/Shunts: The atrial septum is grossly normal.  LEFT VENTRICLE PLAX 2D LVIDd:         4.00 cm     Diastology LVIDs:         2.10 cm     LV e' medial:    3.59 cm/s LV PW:         0.90 cm     LV E/e' medial:  20.9 LV IVS:        1.20 cm     LV e' lateral:   6.74 cm/s LVOT diam:     1.90 cm     LV E/e' lateral: 11.1 LV SV:         56 LV SV Index:  34 LVOT Area:     2.84 cm  LV Volumes (MOD) LV vol d, MOD A4C: 58.1 ml LV vol s, MOD A4C: 10.3 ml LV SV MOD A4C:     58.1 ml RIGHT VENTRICLE RV S prime:     23.90 cm/s TAPSE (M-mode): 1.9 cm LEFT ATRIUM             Index        RIGHT ATRIUM          Index LA diam:        3.30 cm 1.99 cm/m   RA Area:     8.57 cm LA  Vol (A2C):   25.4 ml 15.30 ml/m  RA Volume:   13.70 ml 8.25 ml/m LA Vol (A4C):   28.6 ml 17.22 ml/m LA Biplane Vol: 27.2 ml 16.38 ml/m  AORTIC VALVE LVOT Vmax:   130.00 cm/s LVOT Vmean:  85.400 cm/s LVOT VTI:    0.197 m  AORTA Ao Root diam: 2.90 cm MITRAL VALVE MV Area (PHT): 9.48 cm     SHUNTS MV Decel Time: 80 msec      Systemic VTI:  0.20 m MV E velocity: 75.10 cm/s   Systemic Diam: 1.90 cm MV A velocity: 139.00 cm/s MV E/A ratio:  0.54 Eleonore Chiquito MD Electronically signed by Eleonore Chiquito MD Signature Date/Time: 12/27/2021/12:28:50 PM    Final    MR BRAIN WO CONTRAST  Result Date: 12/26/2021 CLINICAL DATA:  Stroke suspected EXAM: MRI HEAD WITHOUT CONTRAST MRA HEAD WITHOUT CONTRAST MRA NECK WITHOUT CONTRAST TECHNIQUE: Multiplanar, multi-echo pulse sequences of the brain and surrounding structures were acquired without intravenous contrast. Angiographic images of the Circle of Willis were acquired using MRA technique without intravenous contrast. Angiographic images of the neck were acquired using MRA technique without intravenous contrast. Carotid stenosis measurements (when applicable) are obtained utilizing NASCET criteria, using the distal internal carotid diameter as the denominator. COMPARISON:  07/25/2021 MRI head, no prior MRA, correlation is made with CT head 07/26/2021 and CTA head and neck 07/24/2021 FINDINGS: MRI HEAD FINDINGS Evaluation is somewhat limited by motion artifact. Brain: Restricted diffusion with ADC correlate in the right MCA territory, the largest area of which involves the posterior right frontal lobe (series 2, image 33), but with additional involvement of the right insula (series 2, image 28), more anterior right frontal lobe (series 2, image 36), and right parietal lobe (series 3, image 12). Many of these areas are adjacent to infarcts noted on 07/25/2021. No acute hemorrhage, mass, mass effect, or midline shift. Encephalomalacia in the right parietal lobe and watershed  territory, likely sequela of the 07/25/2021 infarcts. No hydrocephalus or extra-axial collection. Unchanged size and configuration of the ventricles, with ex vacuo dilatation reflecting central volume loss. Confluent T2 hyperintense signal in the periventricular white matter, likely the sequela of severe chronic small vessel ischemic disease. Vascular: Please see MRA findings below. Skull and upper cervical spine: Normal marrow signal. Sinuses/Orbits: Clear paranasal sinuses. Status post bilateral lens replacements. Other: The mastoids are well aerated. MRA HEAD FINDINGS Evaluation is limited by motion artifact. Anterior circulation: Grossly normal signal in the right ICA and proximal MCA. Poor visualization of the left ACA. Relatively poor signal in the right ICA, with no signal seen past the terminus (series 2, image 60). No definite flow signal is seen in right MCA. Possible signal in the right A1. Posterior circulation: The proximal vertebral arteries are not evaluated. The distal vertebral arteries are patent to the vertebrobasilar junction. The  basilar artery is patent to the terminus. The SCA is are not well seen. Grossly normal flow in the bilateral PCAs. MRA NECK FINDINGS Evaluation is limited by motion in the absence of intravenous contrast. Aortic arch: Three-vessel arch. No evidence of aneurysm or dissection in the imaged aorta. Right carotid system: Severe stenosis at the origin of the ICA, with poor flow signal seen in the right compared to the left. On the 2D acquisition, flow signal is seen to the supraclinoid segment, but on the 3D acquisition, definitive signal is not seen past the mid right ICA. Left carotid system: Grossly patent. Vertebral arteries: Left dominant system, with relatively diminutive right vertebral artery. Both vertebral arteries appear patent to the V3 segment. Other: None IMPRESSION: 1. Evaluation is somewhat limited by motion artifact. Within this limitation, there are acute  infarcts in the right MCA territory, with the largest area of infarction in the posterior right frontal lobe, but with additional involvement of the right insula, anterior right frontal lobe, and right parietal lobe. 2. Severe stenosis at the origin of the right ICA, with poor flow signal in the mid and distal right extracranial ICA, relatively diminished signal in the right intracranial ICA, and no signal seen in the right MCA. Minimal signal in the right A1. This is poorly evaluated given the degree of motion and the degree of stenosis and could be better evaluated with a CTA head and neck. These results were called by telephone at the time of interpretation on 12/26/2021 at 7:36 pm to provider [facility] , who verbally acknowledged these results. Electronically Signed   By: Merilyn Baba M.D.   On: 12/26/2021 19:40   MR ANGIO HEAD WO CONTRAST  Result Date: 12/26/2021 CLINICAL DATA:  Stroke suspected EXAM: MRI HEAD WITHOUT CONTRAST MRA HEAD WITHOUT CONTRAST MRA NECK WITHOUT CONTRAST TECHNIQUE: Multiplanar, multi-echo pulse sequences of the brain and surrounding structures were acquired without intravenous contrast. Angiographic images of the Circle of Willis were acquired using MRA technique without intravenous contrast. Angiographic images of the neck were acquired using MRA technique without intravenous contrast. Carotid stenosis measurements (when applicable) are obtained utilizing NASCET criteria, using the distal internal carotid diameter as the denominator. COMPARISON:  07/25/2021 MRI head, no prior MRA, correlation is made with CT head 07/26/2021 and CTA head and neck 07/24/2021 FINDINGS: MRI HEAD FINDINGS Evaluation is somewhat limited by motion artifact. Brain: Restricted diffusion with ADC correlate in the right MCA territory, the largest area of which involves the posterior right frontal lobe (series 2, image 33), but with additional involvement of the right insula (series 2, image 28), more  anterior right frontal lobe (series 2, image 36), and right parietal lobe (series 3, image 12). Many of these areas are adjacent to infarcts noted on 07/25/2021. No acute hemorrhage, mass, mass effect, or midline shift. Encephalomalacia in the right parietal lobe and watershed territory, likely sequela of the 07/25/2021 infarcts. No hydrocephalus or extra-axial collection. Unchanged size and configuration of the ventricles, with ex vacuo dilatation reflecting central volume loss. Confluent T2 hyperintense signal in the periventricular white matter, likely the sequela of severe chronic small vessel ischemic disease. Vascular: Please see MRA findings below. Skull and upper cervical spine: Normal marrow signal. Sinuses/Orbits: Clear paranasal sinuses. Status post bilateral lens replacements. Other: The mastoids are well aerated. MRA HEAD FINDINGS Evaluation is limited by motion artifact. Anterior circulation: Grossly normal signal in the right ICA and proximal MCA. Poor visualization of the left ACA. Relatively poor signal in the  right ICA, with no signal seen past the terminus (series 2, image 60). No definite flow signal is seen in right MCA. Possible signal in the right A1. Posterior circulation: The proximal vertebral arteries are not evaluated. The distal vertebral arteries are patent to the vertebrobasilar junction. The basilar artery is patent to the terminus. The SCA is are not well seen. Grossly normal flow in the bilateral PCAs. MRA NECK FINDINGS Evaluation is limited by motion in the absence of intravenous contrast. Aortic arch: Three-vessel arch. No evidence of aneurysm or dissection in the imaged aorta. Right carotid system: Severe stenosis at the origin of the ICA, with poor flow signal seen in the right compared to the left. On the 2D acquisition, flow signal is seen to the supraclinoid segment, but on the 3D acquisition, definitive signal is not seen past the mid right ICA. Left carotid system: Grossly  patent. Vertebral arteries: Left dominant system, with relatively diminutive right vertebral artery. Both vertebral arteries appear patent to the V3 segment. Other: None IMPRESSION: 1. Evaluation is somewhat limited by motion artifact. Within this limitation, there are acute infarcts in the right MCA territory, with the largest area of infarction in the posterior right frontal lobe, but with additional involvement of the right insula, anterior right frontal lobe, and right parietal lobe. 2. Severe stenosis at the origin of the right ICA, with poor flow signal in the mid and distal right extracranial ICA, relatively diminished signal in the right intracranial ICA, and no signal seen in the right MCA. Minimal signal in the right A1. This is poorly evaluated given the degree of motion and the degree of stenosis and could be better evaluated with a CTA head and neck. These results were called by telephone at the time of interpretation on 12/26/2021 at 7:36 pm to provider [facility] , who verbally acknowledged these results. Electronically Signed   By: Merilyn Baba M.D.   On: 12/26/2021 19:40   MR ANGIO NECK WO CONTRAST  Result Date: 12/26/2021 CLINICAL DATA:  Stroke suspected EXAM: MRI HEAD WITHOUT CONTRAST MRA HEAD WITHOUT CONTRAST MRA NECK WITHOUT CONTRAST TECHNIQUE: Multiplanar, multi-echo pulse sequences of the brain and surrounding structures were acquired without intravenous contrast. Angiographic images of the Circle of Willis were acquired using MRA technique without intravenous contrast. Angiographic images of the neck were acquired using MRA technique without intravenous contrast. Carotid stenosis measurements (when applicable) are obtained utilizing NASCET criteria, using the distal internal carotid diameter as the denominator. COMPARISON:  07/25/2021 MRI head, no prior MRA, correlation is made with CT head 07/26/2021 and CTA head and neck 07/24/2021 FINDINGS: MRI HEAD FINDINGS Evaluation is somewhat  limited by motion artifact. Brain: Restricted diffusion with ADC correlate in the right MCA territory, the largest area of which involves the posterior right frontal lobe (series 2, image 33), but with additional involvement of the right insula (series 2, image 28), more anterior right frontal lobe (series 2, image 36), and right parietal lobe (series 3, image 12). Many of these areas are adjacent to infarcts noted on 07/25/2021. No acute hemorrhage, mass, mass effect, or midline shift. Encephalomalacia in the right parietal lobe and watershed territory, likely sequela of the 07/25/2021 infarcts. No hydrocephalus or extra-axial collection. Unchanged size and configuration of the ventricles, with ex vacuo dilatation reflecting central volume loss. Confluent T2 hyperintense signal in the periventricular white matter, likely the sequela of severe chronic small vessel ischemic disease. Vascular: Please see MRA findings below. Skull and upper cervical spine: Normal marrow  signal. Sinuses/Orbits: Clear paranasal sinuses. Status post bilateral lens replacements. Other: The mastoids are well aerated. MRA HEAD FINDINGS Evaluation is limited by motion artifact. Anterior circulation: Grossly normal signal in the right ICA and proximal MCA. Poor visualization of the left ACA. Relatively poor signal in the right ICA, with no signal seen past the terminus (series 2, image 60). No definite flow signal is seen in right MCA. Possible signal in the right A1. Posterior circulation: The proximal vertebral arteries are not evaluated. The distal vertebral arteries are patent to the vertebrobasilar junction. The basilar artery is patent to the terminus. The SCA is are not well seen. Grossly normal flow in the bilateral PCAs. MRA NECK FINDINGS Evaluation is limited by motion in the absence of intravenous contrast. Aortic arch: Three-vessel arch. No evidence of aneurysm or dissection in the imaged aorta. Right carotid system: Severe  stenosis at the origin of the ICA, with poor flow signal seen in the right compared to the left. On the 2D acquisition, flow signal is seen to the supraclinoid segment, but on the 3D acquisition, definitive signal is not seen past the mid right ICA. Left carotid system: Grossly patent. Vertebral arteries: Left dominant system, with relatively diminutive right vertebral artery. Both vertebral arteries appear patent to the V3 segment. Other: None IMPRESSION: 1. Evaluation is somewhat limited by motion artifact. Within this limitation, there are acute infarcts in the right MCA territory, with the largest area of infarction in the posterior right frontal lobe, but with additional involvement of the right insula, anterior right frontal lobe, and right parietal lobe. 2. Severe stenosis at the origin of the right ICA, with poor flow signal in the mid and distal right extracranial ICA, relatively diminished signal in the right intracranial ICA, and no signal seen in the right MCA. Minimal signal in the right A1. This is poorly evaluated given the degree of motion and the degree of stenosis and could be better evaluated with a CTA head and neck. These results were called by telephone at the time of interpretation on 12/26/2021 at 7:36 pm to provider [facility] , who verbally acknowledged these results. Electronically Signed   By: Merilyn Baba M.D.   On: 12/26/2021 19:40   DG Chest 1 View  Result Date: 12/26/2021 CLINICAL DATA:  [id number] Stroke (cerebrum) [facility]) [id number] EXAM: CHEST  1 VIEW COMPARISON:  September 19, 2018 FINDINGS: The cardiomediastinal silhouette is unchanged in contour.Atherosclerotic calcifications of the aorta. No pleural effusion. No pneumothorax. Mildly increased bronchitic markings bilaterally. No focal consolidation. Visualized abdomen is unremarkable. IMPRESSION: Mildly increased bronchitic markings bilaterally. This is nonspecific and could reflect bronchitis or mild underlying pulmonary edema.  Electronically Signed   By: Valentino Saxon M.D.   On: 12/26/2021 19:37   CT HEAD WO CONTRAST  Result Date: 12/26/2021 CLINICAL DATA:  Neuro deficit, acute, stroke suspected EXAM: CT HEAD WITHOUT CONTRAST TECHNIQUE: Contiguous axial images were obtained from the base of the skull through the vertex without intravenous contrast. RADIATION DOSE REDUCTION: This exam was performed according to the departmental dose-optimization program which includes automated exposure control, adjustment of the mA and/or kV according to patient size and/or use of iterative reconstruction technique. COMPARISON:  CT November 30, 2021. FINDINGS: Brain: New/interval infarct in the posterior right frontal lobe, potentially acute. No evidence of acute hemorrhage, hydrocephalus, extra-axial collection or mass lesion/mass effect. Additional remote right frontal and parietal infarcts. Additional patchy white matter hypodensities, nonspecific but compatible with chronic microvascular ischemic disease. Vascular: No hyperdense vessel.  Calcific atherosclerosis. Skull: No acute fracture. Sinuses/Orbits: No acute finding. Other: No mastoid effusions. IMPRESSION: New/interval infarct in the posterior right frontal lobe, potentially acute. Recommend MRI for further evaluation. Electronically Signed   By: Margaretha Sheffield M.D.   On: 12/26/2021 15:01     Scheduled Meds:  ezetimibe  10 mg Oral Daily   insulin aspart  0-15 Units Subcutaneous TID WC   ticagrelor  180 mg Oral Once   ticagrelor  90 mg Oral BID   venlafaxine XR  75 mg Oral Q breakfast   Continuous Infusions:  sodium chloride 100 mL/hr at 12/27/21 0733   heparin 800 Units/hr (12/26/21 2128)    LOS: 1 day   Raiford Noble, DO [facility] Available via Epic secure chat 7am-7pm After these hours, please refer to coverage provider listed on [email or web address] 12/27/2021, 9:55 PM

## 2021-12-27 NOTE — Inpatient Diabetes Management (Signed)
Inpatient Diabetes Program Recommendations  AACE/ADA: New Consensus Statement on Inpatient Glycemic Control (2015)  Target Ranges:  Prepandial:   less than 140 mg/dL      Peak postprandial:   less than 180 mg/dL (1-2 hours)      Critically ill patients:  140 - 180 mg/dL   Lab Results  Component Value Date   GLUCAP 211 (H) 12/27/2021   HGBA1C 11.1 (H) 07/25/2021    Latest Reference Range & Units 12/02/21 06:01 12/02/21 11:54 12/02/21 16:57 12/27/21 07:48 12/27/21 12:24  Glucose-Capillary 70 - 99 mg/dL 239 (H) 267 (H) 235 (H) 202 (H) 211 (H)  (H): Data is abnormally high Review of Glycemic Control  Diabetes history: type 2 Outpatient Diabetes medications: Semglee 30 units daily, Novolog 3 units TID, Glipizide 2 tabs in am, 1 tab in pm Current orders for Inpatient glycemic control: Novolog 0-15 units TID  Inpatient Diabetes Program Recommendations:   Received diabetes coordinator consult for insulin dosage needs. Patient takes Semglee 30 units daily at home. Noted that patient is NPO and getting a cortrak feeding tube.   Recommend Semglee 20 units daily, Novolog 0-15 units every 4 hours since NPO and starting feeding tube. Titrate dosages as needed. May need tube feed coverage if blood sugars >180 mg/dl.   Will continue to monitor blood sugars while in the hospital.  Harvel Ricks RN BSN CDE Diabetes Coordinator Pager: [phone]  8am-5pm

## 2021-12-27 NOTE — Progress Notes (Signed)
Carotid duplex bilateral study completed.   Please see CV Proc for preliminary results.   Vada Yellen, RDMS, RVT  

## 2021-12-27 NOTE — Progress Notes (Signed)
Received an IR request that patient to receive loading dose of Brilinta for the incoming IR procedure of right ICA stenting tomorrow. As patient failed swallow evaluation today, the loading dose of Brilinta not given.  Communicated with nurse, a Blankmo[place] tube was place and abd xray pending, IR aware. Nurse will administer Brilinta loading if NGT position satisfying.

## 2021-12-27 NOTE — Progress Notes (Signed)
[place] for heparin  Indication: DVT, hx of PE 11/2021 on Eliquis PTA   Allergies  Allergen Reactions   Ace Inhibitors Diarrhea, Swelling, Other (See Comments) and Cough    Pt had cough and diarrhea with first few doses of medication; also had swelling of right eyelid. Stopped medication on 03/17/11.   Alendronate Sodium Other (See Comments)    "Made my whole body hurt"   Aspirin Hives   Crestor [Rosuvastatin] Other (See Comments)    "Made my whole body hurt"   Lipitor [Atorvastatin Calcium] Other (See Comments)    "Made my whole body hurt"   Metformin And Related     Severe abdominal pain   Shellfish Allergy Nausea Only    Intolerant of fresh shellfish, reports the reaction is GI upset, denies hives, denies any swelling  Reports that she can tolerate canned seafood.    Statins Other (See Comments)    myalgia    Patient Measurements: Height: '5\' 1"'$  (154.9 cm) Weight: 67 kg (147 lb 11.3 oz) IBW/kg (Calculated) : 47.8 Heparin Dosing Weight: 61.9kg   Vital Signs: Temp: 98.4 F (36.9 C) (12/19 1645) Temp Source: Oral (12/19 1645) BP: 151/67 (12/19 1645) Pulse Rate: 116 (12/19 1645)  Labs: Recent Labs    12/26/21 1435 12/26/21 1455 12/27/21 0644 12/27/21 0645 12/27/21 1400 12/27/21 1430  HGB 14.7 14.6 12.3  --   --   --   HCT 46.2* 43.0 35.9*  --   --   --   PLT 274  --  291  --   --   --   APTT 29  --  84*  --  75*  --   LABPROT 15.8*  --   --   --   --   --   INR 1.3*  --   --   --   --   --   HEPARINUNFRC  --   --   --  >1.10*  --  >1.10*  CREATININE 0.78 0.60  --  0.78  --   --      Estimated Creatinine Clearance: 48.3 mL/min (by C-G formula based on SCr of 0.78 mg/dL).   Medical History: Past Medical History:  Diagnosis Date   Arthritis    Carotid stenosis 11/2018   Cataract    surgery to remove   Diabetes mellitus    type 2   Hyperlipidemia    Hypertension    Skin cancer    Removed from face   Stroke [facility])  09/19/2018   Urgency of urination    Urinary leakage     Assessment: Patient admitted with stroke like symptoms, head CT concerning for R frontal cortical stroke. She was out of the window for TNK. Patient on Eliquis PTA for PE 11/2021. Last dose unclear. Patient confused and unable to answer, called family x 2 with no response. Based on time of admission, has been at least 8 hours from last dose of Eliquis. Pharmacy consulted to dose heparin.   aPTT therapeutic at 75, HL still not correlating at > 1.10. Hgb stable.   Goal of Therapy:  Anti-Xa 0.3-0.5 aPTT 66-85 Monitor platelets by anticoagulation protocol: Yes   Plan:  Cont heparin 800 units/hr Recheck APTT and anti-Xa level in AM.  Monitor H&H daily.   Esmeralda Arthur, PharmD, [place]  Clinical Pharmacist Phone: [phone]

## 2021-12-27 NOTE — Anesthesia Preprocedure Evaluation (Signed)
Anesthesia Evaluation    Reviewed: Allergy & Precautions, Patient's Chart, lab work & pertinent test results  Airway        Dental   Pulmonary neg pulmonary ROS          Cardiovascular hypertension, Pt. on medications negative cardio ROS      Neuro/Psych  PSYCHIATRIC DISORDERS      CVA, Residual Symptoms  negative psych ROS   GI/Hepatic negative GI ROS, Neg liver ROS,,,  Endo/Other  negative endocrine ROSdiabetes, Type 2    Renal/GU negative Renal ROS  negative genitourinary   Musculoskeletal  (+) Arthritis , Osteoarthritis,    Abdominal  (+) + obese  Peds negative pediatric ROS (+)  Hematology negative hematology ROS (+)   Anesthesia Other Findings   Reproductive/Obstetrics negative OB ROS                             Anesthesia Physical Anesthesia Plan  ASA: 3  Anesthesia Plan: General   Post-op Pain Management: Minimal or no pain anticipated   Induction: Intravenous  PONV Risk Score and Plan: 3 and Ondansetron, Dexamethasone, Midazolam and Treatment may vary due to age or medical condition  Airway Management Planned: Oral ETT  Additional Equipment: Arterial line  Intra-op Plan:   Post-operative Plan: Extubation in OR  Informed Consent: I have reviewed the patients History and Physical, chart, labs and discussed the procedure including the risks, benefits and alternatives for the proposed anesthesia with the patient or authorized representative who has indicated his/her understanding and acceptance.     Dental advisory given  Plan Discussed with: CRNA  Anesthesia Plan Comments: (  ECHO 12/23  Peak intra-cavitary gradient 28 mmHg at rest due to hyperdynamic LV  function. No mitral valve SAM or outflow tract obstruction.      Left ventricular ejection fraction, by estimation, is >75%. The left  ventricle has hyperdynamic function. The left ventricle has no regional   wall motion abnormalities. There is mild asymmetric left ventricular  hypertrophy of the basal-septal segment.   Left ventricular diastolic parameters are consistent with Grade I  diastolic dysfunction   Carotid US 12/23 Right Carotid: Velocities in the right ICA are consistent with a 80-99%                 stenosis. The ECA appears >50% stenosed. Incidental: Large                 thyroid nodule with internal calcifications noted.   Left Carotid: Velocities in the left ICA are consistent with a 1-39%  stenosis.               Incidental: Multiple thyroid nodules, some with internal                calcifications noted.  Vertebrals:  Bilateral vertebral arteries demonstrate antegrade flow.  Subclavians: Right subclavian artery flow was disturbed. Normal flow               hemodynamics were seen in the left subclavian artery.    )        Anesthesia Quick Evaluation

## 2021-12-27 NOTE — Progress Notes (Signed)
[place] for heparin  Indication: DVT, hx of PE 11/2021 on Eliquis PTA   Allergies  Allergen Reactions   Ace Inhibitors Diarrhea, Swelling, Other (See Comments) and Cough    Pt had cough and diarrhea with first few doses of medication; also had swelling of right eyelid. Stopped medication on 03/17/11.   Alendronate Sodium Other (See Comments)    "Made my whole body hurt"   Aspirin Hives   Crestor [Rosuvastatin] Other (See Comments)    "Made my whole body hurt"   Lipitor [Atorvastatin Calcium] Other (See Comments)    "Made my whole body hurt"   Metformin And Related     Severe abdominal pain   Shellfish Allergy Nausea Only    Intolerant of fresh shellfish, reports the reaction is GI upset, denies hives, denies any swelling  Reports that she can tolerate canned seafood.    Statins Other (See Comments)    myalgia    Patient Measurements: Height: '5\' 1"'$  (154.9 cm) Weight: 67 kg (147 lb 11.3 oz) IBW/kg (Calculated) : 47.8 Heparin Dosing Weight: 61.9kg   Vital Signs: Temp: 98.3 F (36.8 C) (12/19 0601) Temp Source: Oral (12/19 0601) BP: 169/65 (12/19 0700) Pulse Rate: 123 (12/19 0700)  Labs: Recent Labs    12/26/21 1435 12/26/21 1455 12/27/21 0644 12/27/21 0645  HGB 14.7 14.6 12.3  --   HCT 46.2* 43.0 35.9*  --   PLT 274  --  291  --   APTT 29  --  84*  --   LABPROT 15.8*  --   --   --   INR 1.3*  --   --   --   HEPARINUNFRC  --   --   --  >1.10*  CREATININE 0.78 0.60  --   --      Estimated Creatinine Clearance: 48.3 mL/min (by C-G formula based on SCr of 0.6 mg/dL).   Medical History: Past Medical History:  Diagnosis Date   Arthritis    Carotid stenosis 11/2018   Cataract    surgery to remove   Diabetes mellitus    type 2   Hyperlipidemia    Hypertension    Skin cancer    Removed from face   Stroke [facility]) 09/19/2018   Urgency of urination    Urinary leakage     Assessment: Patient admitted with stroke like  symptoms, head CT concerning for R frontal cortical stroke. She was out of the window for TNK. Patient on Eliquis PTA for PE 11/2021. Last dose unclear. Patient confused and unable to answer, called family x 2 with no response. Based on time of admission, has been at least 8 hours from last dose of Eliquis. Pharmacy consulted to dose heparin.   12/19 AM update:  aPTT within range  Goal of Therapy:  Anti-Xa 0.3-0.5 aPTT 66-85 Monitor platelets by anticoagulation protocol: Yes   Plan:  Cont heparin 800 units/hr 1400 aPTT and heparin level  Narda Bonds, PharmD, BCPS Clinical Pharmacist Phone: [phone]

## 2021-12-27 NOTE — Plan of Care (Signed)
  Problem: Skin Integrity: Goal: Risk for impaired skin integrity will decrease Outcome: Progressing   Problem: Tissue Perfusion: Goal: Adequacy of tissue perfusion will improve Outcome: Progressing   Problem: Nutrition: Goal: Risk of aspiration will decrease Outcome: Progressing

## 2021-12-27 NOTE — Progress Notes (Signed)
  Echocardiogram 2D Echocardiogram has been performed.  Darlina Sicilian M 12/27/2021, 11:23 AM

## 2021-12-27 NOTE — Progress Notes (Addendum)
STROKE TEAM PROGRESS NOTE   INTERVAL HISTORY Patient is seen in her room with no family at the bedside.  At her assisted living facility Thursday, she was found to have acute onset left upper extremity weakness.  Workup for medical causes of this weakness was started, and she was taken to the ER yesterday.  CT head taken in the emergency department was concerning for right frontal cortical stroke.  She was outside the window for both TNK and mechanical thrombectomy.  MRI brain shows restricted diffusion in right MCA territory.  MRA head and neck shows severe stenosis at origin of right ICA, and interventional radiology has been consulted for possible stenting of this vessel. Patient had presented in July 2023 with right MCA branch infarct and was found to have symptomatic severe proximal right carotid stenosis and had been seen by vascular surgery but her stenosis was not felt to be amenable to endarterectomy or TCAR procedure.  Interventional neuroradiology was consulted but the patient declined stenting at that time. Vitals:   12/27/21 0601 12/27/21 0615 12/27/21 0700 12/27/21 1000  BP:  (!) 178/73 (!) 169/65 136/63  Pulse:  (!) 116 (!) 123 (!) 119  Resp:  17  14  Temp: 98.3 F (36.8 C)   98.2 F (36.8 C)  TempSrc: Oral   Oral  SpO2:  96% 95% 95%  Weight:      Height:       CBC:  Recent Labs  Lab 12/26/21 1435 12/26/21 1455 12/27/21 0644  WBC 12.8*  --  14.1*  NEUTROABS 10.3*  --   --   HGB 14.7 14.6 12.3  HCT 46.2* 43.0 35.9*  MCV 94.7  --  90.7  PLT 274  --  591   Basic Metabolic Panel:  Recent Labs  Lab 12/26/21 1435 12/26/21 1455 12/27/21 0645  NA 137 138 139  K 3.5 4.7 3.4*  CL 102 105 106  CO2 21*  --  19*  GLUCOSE 113* 110* 172*  BUN '14 20 15  '$ CREATININE 0.78 0.60 0.78  CALCIUM 10.1  --  9.1  MG  --   --  1.5*  PHOS  --   --  3.0   Lipid Panel:  Recent Labs  Lab 12/27/21 0645  CHOL 175  TRIG 92  HDL 37*  CHOLHDL 4.7  VLDL 18  LDLCALC 120*   HgbA1c:  No results for input(s): "HGBA1C" in the last 168 hours. Urine Drug Screen: No results for input(s): "LABOPIA", "COCAINSCRNUR", "LABBENZ", "AMPHETMU", "THCU", "LABBARB" in the last 168 hours.  Alcohol Level  Recent Labs  Lab 12/26/21 1435  ETH <10    IMAGING past 24 hours VAS US CAROTID  Result Date: 12/27/2021 Carotid Arterial Duplex Study Patient Name:  Wanda [name]  Date of Exam:   12/27/2021 Medical Rec #: [id number]            Accession #:    [id number] Date of Birth: [birth year]             Patient Gender: F Patient Age:   81 years Exam Location:  [facility] Procedure:      VAS US CAROTID Referring Phys: Sohan Potvin --------------------------------------------------------------------------------  Indications:       CVA. Limitations        Today's exam was limited due to patient somnolence, snoring,                    coughing, and grabbing technologist hand/probe. Comparison Study:  Multiple prior imaging studies. MRA Neck 12/26/21                    demonstrated severe stenosis at the right ICA origin with                    poor flow signal in the right as compared to left.                    CTA Neck 08/03/21 demonstrated severe right ICA stenosis                    approaching string sign.                    Carotid Duplex 07/26/21 demonstrated flow velocities                    consistent with 40-59% by EDV; however, 60-79% by ICA/CCA                    ratio, possibly higher in the setting of PSV of 383 cm/s. Performing Technologist: Darlin Coco RDMS, RVT  Examination Guidelines: A complete evaluation includes B-mode imaging, spectral Doppler, color Doppler, and power Doppler as needed of all accessible portions of each vessel. Bilateral testing is considered an integral part of a complete examination. Limited examinations for reoccurring indications may be performed as noted.  Right Carotid Findings:  +----------+--------+--------+--------+------------------+-------------------+           PSV cm/sEDV cm/sStenosisPlaque DescriptionComments            +----------+--------+--------+--------+------------------+-------------------+ CCA Prox  59      7                                                     +----------+--------+--------+--------+------------------+-------------------+ CCA Distal59      7                                                     +----------+--------+--------+--------+------------------+-------------------+ ICA Prox  492     143     80-99%  calcific                              +----------+--------+--------+--------+------------------+-------------------+ ICA Mid   50      8                                                     +----------+--------+--------+--------+------------------+-------------------+ ICA Distal                                          Unable to visualize +----------+--------+--------+--------+------------------+-------------------+ ECA       393     13      >50%    calcific                              +----------+--------+--------+--------+------------------+-------------------+ +----------+--------+-------+---------+-------------------+  PSV cm/sEDV cmsDescribe Arm Pressure (mmHG) +----------+--------+-------+---------+-------------------+ Subclavian220            Turbulent                    +----------+--------+-------+---------+-------------------+ +---------+--------+--+--------+-+---------+ VertebralPSV cm/s70EDV cm/s9Antegrade +---------+--------+--+--------+-+---------+  Left Carotid Findings: +----------+--------+--------+--------+------------------+--------+           PSV cm/sEDV cm/sStenosisPlaque DescriptionComments +----------+--------+--------+--------+------------------+--------+ CCA Prox  121     15                                          +----------+--------+--------+--------+------------------+--------+ CCA Distal156     15              calcific                   +----------+--------+--------+--------+------------------+--------+ ICA Prox  77      15      1-39%                              +----------+--------+--------+--------+------------------+--------+ ICA Mid   90      19                                         +----------+--------+--------+--------+------------------+--------+ ICA Distal87      17                                         +----------+--------+--------+--------+------------------+--------+ ECA       158     18                                         +----------+--------+--------+--------+------------------+--------+ +----------+--------+--------+----------------+-------------------+           PSV cm/sEDV cm/sDescribe        Arm Pressure (mmHG) +----------+--------+--------+----------------+-------------------+ [id number]             Multiphasic, WNL                    +----------+--------+--------+----------------+-------------------+ +---------+--------+--+--------+--+---------+ VertebralPSV cm/s80EDV cm/s17Antegrade +---------+--------+--+--------+--+---------+   Summary: Right Carotid: Velocities in the right ICA are consistent with a 80-99%                stenosis. The ECA appears >50% stenosed. Incidental: Large                thyroid nodule with internal calcifications noted. Left Carotid: Velocities in the left ICA are consistent with a 1-39% stenosis.               Incidental: Multiple thyroid nodules, some with internal               calcifications noted. Vertebrals:  Bilateral vertebral arteries demonstrate antegrade flow. Subclavians: Right subclavian artery flow was disturbed. Normal flow              hemodynamics were seen in the left subclavian artery. *See table(s) above for measurements and observations.  Electronically signed by Antony Contras MD on  12/27/2021 at 12:37:34 PM.    Final    ECHOCARDIOGRAM COMPLETE  Result  Date: 12/27/2021    ECHOCARDIOGRAM REPORT   Patient Name:   Wanda [name] Date of Exam: 12/27/2021 Medical Rec #:  [id number]           Height:       61.0 in Accession #:    [id number]          Weight:       147.7 lb Date of Birth:  [birth year]            BSA:          1.660 m Patient Age:    88 years            BP:           158/57 mmHg Patient Gender: F                   HR:           123 bpm. Exam Location:  Inpatient Procedure: 2D Echo, Cardiac Doppler and Color Doppler Indications:    Stroke I63.9  History:        Patient has prior history of Echocardiogram examinations, most                 recent 07/25/2021. Carotid Disease, Signs/Symptoms:Altered mental                 status unable to follow commands.; Risk Factors:Hypertension,                 Diabetes and Dyslipidemia.  Sonographer:    Darlina Sicilian RDCS Referring Phys: [id number] [place]  1. Peak intra-cavitary gradient 28 mmHg at rest due to hyperdynamic LV function. No mitral valve SAM or outflow tract obstruction.     Left ventricular ejection fraction, by estimation, is >75%. The left ventricle has hyperdynamic function. The left ventricle has no regional wall motion abnormalities. There is mild asymmetric left ventricular hypertrophy of the basal-septal segment.  Left ventricular diastolic parameters are consistent with Grade I diastolic dysfunction (impaired relaxation).  2. Right ventricular systolic function is hyperdynamic. The right ventricular size is normal. Tricuspid regurgitation signal is inadequate for assessing PA pressure.  3. The mitral valve is grossly normal. No evidence of mitral valve regurgitation. No evidence of mitral stenosis.  4. The aortic valve is tricuspid. Aortic valve regurgitation is not visualized. No aortic stenosis is present.  5. The inferior vena cava is normal in size with greater than 50% respiratory variability,  suggesting right atrial pressure of 3 mmHg. Comparison(s): No significant change from prior study. FINDINGS  Left Ventricle: Peak intra-cavitary gradient 28 mmHg at rest due to hyperdynamic LV function. No mitral valve SAM or outflow tract obstruction. Left ventricular ejection fraction, by estimation, is >75%. The left ventricle has hyperdynamic function. The  left ventricle has no regional wall motion abnormalities. The left ventricular internal cavity size was small. There is mild asymmetric left ventricular hypertrophy of the basal-septal segment. Indeterminate diastolic filling due to E-A fusion. Right Ventricle: The right ventricular size is normal. No increase in right ventricular wall thickness. Right ventricular systolic function is hyperdynamic. Tricuspid regurgitation signal is inadequate for assessing PA pressure. Left Atrium: Left atrial size was normal in size. Right Atrium: Right atrial size was normal in size. Pericardium: Trivial pericardial effusion is present. The pericardial effusion is surrounding the apex. Mitral Valve: The mitral valve is grossly normal. Mild mitral annular calcification. No evidence of mitral valve regurgitation. No evidence of mitral valve stenosis. Tricuspid  Valve: The tricuspid valve is grossly normal. Tricuspid valve regurgitation is trivial. No evidence of tricuspid stenosis. Aortic Valve: The aortic valve is tricuspid. Aortic valve regurgitation is not visualized. No aortic stenosis is present. Pulmonic Valve: The pulmonic valve was grossly normal. Pulmonic valve regurgitation is not visualized. No evidence of pulmonic stenosis. Aorta: The aortic root is normal in size and structure. Venous: The inferior vena cava is normal in size with greater than 50% respiratory variability, suggesting right atrial pressure of 3 mmHg. IAS/Shunts: The atrial septum is grossly normal.  LEFT VENTRICLE PLAX 2D LVIDd:         4.00 cm     Diastology LVIDs:         2.10 cm     LV e' medial:     3.59 cm/s LV PW:         0.90 cm     LV E/e' medial:  20.9 LV IVS:        1.20 cm     LV e' lateral:   6.74 cm/s LVOT diam:     1.90 cm     LV E/e' lateral: 11.1 LV SV:         56 LV SV Index:   34 LVOT Area:     2.84 cm  LV Volumes (MOD) LV vol d, MOD A4C: 58.1 ml LV vol s, MOD A4C: 10.3 ml LV SV MOD A4C:     58.1 ml RIGHT VENTRICLE RV S prime:     23.90 cm/s TAPSE (M-mode): 1.9 cm LEFT ATRIUM             Index        RIGHT ATRIUM          Index LA diam:        3.30 cm 1.99 cm/m   RA Area:     8.57 cm LA Vol (A2C):   25.4 ml 15.30 ml/m  RA Volume:   13.70 ml 8.25 ml/m LA Vol (A4C):   28.6 ml 17.22 ml/m LA Biplane Vol: 27.2 ml 16.38 ml/m  AORTIC VALVE LVOT Vmax:   130.00 cm/s LVOT Vmean:  85.400 cm/s LVOT VTI:    0.197 m  AORTA Ao Root diam: 2.90 cm MITRAL VALVE MV Area (PHT): 9.48 cm     SHUNTS MV Decel Time: 80 msec      Systemic VTI:  0.20 m MV E velocity: 75.10 cm/s   Systemic Diam: 1.90 cm MV A velocity: 139.00 cm/s MV E/A ratio:  0.54 Eleonore Chiquito MD Electronically signed by Eleonore Chiquito MD Signature Date/Time: 12/27/2021/12:28:50 PM    Final    MR BRAIN WO CONTRAST  Result Date: 12/26/2021 CLINICAL DATA:  Stroke suspected EXAM: MRI HEAD WITHOUT CONTRAST MRA HEAD WITHOUT CONTRAST MRA NECK WITHOUT CONTRAST TECHNIQUE: Multiplanar, multi-echo pulse sequences of the brain and surrounding structures were acquired without intravenous contrast. Angiographic images of the Circle of Willis were acquired using MRA technique without intravenous contrast. Angiographic images of the neck were acquired using MRA technique without intravenous contrast. Carotid stenosis measurements (when applicable) are obtained utilizing NASCET criteria, using the distal internal carotid diameter as the denominator. COMPARISON:  07/25/2021 MRI head, no prior MRA, correlation is made with CT head 07/26/2021 and CTA head and neck 07/24/2021 FINDINGS: MRI HEAD FINDINGS Evaluation is somewhat limited by motion artifact. Brain:  Restricted diffusion with ADC correlate in the right MCA territory, the largest area of which involves the posterior right frontal lobe (series 2, image 33),  but with additional involvement of the right insula (series 2, image 28), more anterior right frontal lobe (series 2, image 36), and right parietal lobe (series 3, image 12). Many of these areas are adjacent to infarcts noted on 07/25/2021. No acute hemorrhage, mass, mass effect, or midline shift. Encephalomalacia in the right parietal lobe and watershed territory, likely sequela of the 07/25/2021 infarcts. No hydrocephalus or extra-axial collection. Unchanged size and configuration of the ventricles, with ex vacuo dilatation reflecting central volume loss. Confluent T2 hyperintense signal in the periventricular white matter, likely the sequela of severe chronic small vessel ischemic disease. Vascular: Please see MRA findings below. Skull and upper cervical spine: Normal marrow signal. Sinuses/Orbits: Clear paranasal sinuses. Status post bilateral lens replacements. Other: The mastoids are well aerated. MRA HEAD FINDINGS Evaluation is limited by motion artifact. Anterior circulation: Grossly normal signal in the right ICA and proximal MCA. Poor visualization of the left ACA. Relatively poor signal in the right ICA, with no signal seen past the terminus (series 2, image 60). No definite flow signal is seen in right MCA. Possible signal in the right A1. Posterior circulation: The proximal vertebral arteries are not evaluated. The distal vertebral arteries are patent to the vertebrobasilar junction. The basilar artery is patent to the terminus. The SCA is are not well seen. Grossly normal flow in the bilateral PCAs. MRA NECK FINDINGS Evaluation is limited by motion in the absence of intravenous contrast. Aortic arch: Three-vessel arch. No evidence of aneurysm or dissection in the imaged aorta. Right carotid system: Severe stenosis at the origin of the ICA, with  poor flow signal seen in the right compared to the left. On the 2D acquisition, flow signal is seen to the supraclinoid segment, but on the 3D acquisition, definitive signal is not seen past the mid right ICA. Left carotid system: Grossly patent. Vertebral arteries: Left dominant system, with relatively diminutive right vertebral artery. Both vertebral arteries appear patent to the V3 segment. Other: None IMPRESSION: 1. Evaluation is somewhat limited by motion artifact. Within this limitation, there are acute infarcts in the right MCA territory, with the largest area of infarction in the posterior right frontal lobe, but with additional involvement of the right insula, anterior right frontal lobe, and right parietal lobe. 2. Severe stenosis at the origin of the right ICA, with poor flow signal in the mid and distal right extracranial ICA, relatively diminished signal in the right intracranial ICA, and no signal seen in the right MCA. Minimal signal in the right A1. This is poorly evaluated given the degree of motion and the degree of stenosis and could be better evaluated with a CTA head and neck. These results were called by telephone at the time of interpretation on 12/26/2021 at 7:36 pm to provider [facility] , who verbally acknowledged these results. Electronically Signed   By: Merilyn Baba M.D.   On: 12/26/2021 19:40   MR ANGIO HEAD WO CONTRAST  Result Date: 12/26/2021 CLINICAL DATA:  Stroke suspected EXAM: MRI HEAD WITHOUT CONTRAST MRA HEAD WITHOUT CONTRAST MRA NECK WITHOUT CONTRAST TECHNIQUE: Multiplanar, multi-echo pulse sequences of the brain and surrounding structures were acquired without intravenous contrast. Angiographic images of the Circle of Willis were acquired using MRA technique without intravenous contrast. Angiographic images of the neck were acquired using MRA technique without intravenous contrast. Carotid stenosis measurements (when applicable) are obtained utilizing NASCET criteria,  using the distal internal carotid diameter as the denominator. COMPARISON:  07/25/2021 MRI head, no prior MRA, correlation is  made with CT head 07/26/2021 and CTA head and neck 07/24/2021 FINDINGS: MRI HEAD FINDINGS Evaluation is somewhat limited by motion artifact. Brain: Restricted diffusion with ADC correlate in the right MCA territory, the largest area of which involves the posterior right frontal lobe (series 2, image 33), but with additional involvement of the right insula (series 2, image 28), more anterior right frontal lobe (series 2, image 36), and right parietal lobe (series 3, image 12). Many of these areas are adjacent to infarcts noted on 07/25/2021. No acute hemorrhage, mass, mass effect, or midline shift. Encephalomalacia in the right parietal lobe and watershed territory, likely sequela of the 07/25/2021 infarcts. No hydrocephalus or extra-axial collection. Unchanged size and configuration of the ventricles, with ex vacuo dilatation reflecting central volume loss. Confluent T2 hyperintense signal in the periventricular white matter, likely the sequela of severe chronic small vessel ischemic disease. Vascular: Please see MRA findings below. Skull and upper cervical spine: Normal marrow signal. Sinuses/Orbits: Clear paranasal sinuses. Status post bilateral lens replacements. Other: The mastoids are well aerated. MRA HEAD FINDINGS Evaluation is limited by motion artifact. Anterior circulation: Grossly normal signal in the right ICA and proximal MCA. Poor visualization of the left ACA. Relatively poor signal in the right ICA, with no signal seen past the terminus (series 2, image 60). No definite flow signal is seen in right MCA. Possible signal in the right A1. Posterior circulation: The proximal vertebral arteries are not evaluated. The distal vertebral arteries are patent to the vertebrobasilar junction. The basilar artery is patent to the terminus. The SCA is are not well seen. Grossly normal flow  in the bilateral PCAs. MRA NECK FINDINGS Evaluation is limited by motion in the absence of intravenous contrast. Aortic arch: Three-vessel arch. No evidence of aneurysm or dissection in the imaged aorta. Right carotid system: Severe stenosis at the origin of the ICA, with poor flow signal seen in the right compared to the left. On the 2D acquisition, flow signal is seen to the supraclinoid segment, but on the 3D acquisition, definitive signal is not seen past the mid right ICA. Left carotid system: Grossly patent. Vertebral arteries: Left dominant system, with relatively diminutive right vertebral artery. Both vertebral arteries appear patent to the V3 segment. Other: None IMPRESSION: 1. Evaluation is somewhat limited by motion artifact. Within this limitation, there are acute infarcts in the right MCA territory, with the largest area of infarction in the posterior right frontal lobe, but with additional involvement of the right insula, anterior right frontal lobe, and right parietal lobe. 2. Severe stenosis at the origin of the right ICA, with poor flow signal in the mid and distal right extracranial ICA, relatively diminished signal in the right intracranial ICA, and no signal seen in the right MCA. Minimal signal in the right A1. This is poorly evaluated given the degree of motion and the degree of stenosis and could be better evaluated with a CTA head and neck. These results were called by telephone at the time of interpretation on 12/26/2021 at 7:36 pm to provider [facility] , who verbally acknowledged these results. Electronically Signed   By: Merilyn Baba M.D.   On: 12/26/2021 19:40   MR ANGIO NECK WO CONTRAST  Result Date: 12/26/2021 CLINICAL DATA:  Stroke suspected EXAM: MRI HEAD WITHOUT CONTRAST MRA HEAD WITHOUT CONTRAST MRA NECK WITHOUT CONTRAST TECHNIQUE: Multiplanar, multi-echo pulse sequences of the brain and surrounding structures were acquired without intravenous contrast. Angiographic  images of the Circle of Willis were acquired  using MRA technique without intravenous contrast. Angiographic images of the neck were acquired using MRA technique without intravenous contrast. Carotid stenosis measurements (when applicable) are obtained utilizing NASCET criteria, using the distal internal carotid diameter as the denominator. COMPARISON:  07/25/2021 MRI head, no prior MRA, correlation is made with CT head 07/26/2021 and CTA head and neck 07/24/2021 FINDINGS: MRI HEAD FINDINGS Evaluation is somewhat limited by motion artifact. Brain: Restricted diffusion with ADC correlate in the right MCA territory, the largest area of which involves the posterior right frontal lobe (series 2, image 33), but with additional involvement of the right insula (series 2, image 28), more anterior right frontal lobe (series 2, image 36), and right parietal lobe (series 3, image 12). Many of these areas are adjacent to infarcts noted on 07/25/2021. No acute hemorrhage, mass, mass effect, or midline shift. Encephalomalacia in the right parietal lobe and watershed territory, likely sequela of the 07/25/2021 infarcts. No hydrocephalus or extra-axial collection. Unchanged size and configuration of the ventricles, with ex vacuo dilatation reflecting central volume loss. Confluent T2 hyperintense signal in the periventricular white matter, likely the sequela of severe chronic small vessel ischemic disease. Vascular: Please see MRA findings below. Skull and upper cervical spine: Normal marrow signal. Sinuses/Orbits: Clear paranasal sinuses. Status post bilateral lens replacements. Other: The mastoids are well aerated. MRA HEAD FINDINGS Evaluation is limited by motion artifact. Anterior circulation: Grossly normal signal in the right ICA and proximal MCA. Poor visualization of the left ACA. Relatively poor signal in the right ICA, with no signal seen past the terminus (series 2, image 60). No definite flow signal is seen in right MCA.  Possible signal in the right A1. Posterior circulation: The proximal vertebral arteries are not evaluated. The distal vertebral arteries are patent to the vertebrobasilar junction. The basilar artery is patent to the terminus. The SCA is are not well seen. Grossly normal flow in the bilateral PCAs. MRA NECK FINDINGS Evaluation is limited by motion in the absence of intravenous contrast. Aortic arch: Three-vessel arch. No evidence of aneurysm or dissection in the imaged aorta. Right carotid system: Severe stenosis at the origin of the ICA, with poor flow signal seen in the right compared to the left. On the 2D acquisition, flow signal is seen to the supraclinoid segment, but on the 3D acquisition, definitive signal is not seen past the mid right ICA. Left carotid system: Grossly patent. Vertebral arteries: Left dominant system, with relatively diminutive right vertebral artery. Both vertebral arteries appear patent to the V3 segment. Other: None IMPRESSION: 1. Evaluation is somewhat limited by motion artifact. Within this limitation, there are acute infarcts in the right MCA territory, with the largest area of infarction in the posterior right frontal lobe, but with additional involvement of the right insula, anterior right frontal lobe, and right parietal lobe. 2. Severe stenosis at the origin of the right ICA, with poor flow signal in the mid and distal right extracranial ICA, relatively diminished signal in the right intracranial ICA, and no signal seen in the right MCA. Minimal signal in the right A1. This is poorly evaluated given the degree of motion and the degree of stenosis and could be better evaluated with a CTA head and neck. These results were called by telephone at the time of interpretation on 12/26/2021 at 7:36 pm to provider [facility] , who verbally acknowledged these results. Electronically Signed   By: Merilyn Baba M.D.   On: 12/26/2021 19:40   DG Chest 1 View  Result Date:  12/26/2021 CLINICAL DATA:  [id number] Stroke (cerebrum) [facility]) [id number] EXAM: CHEST  1 VIEW COMPARISON:  September 19, 2018 FINDINGS: The cardiomediastinal silhouette is unchanged in contour.Atherosclerotic calcifications of the aorta. No pleural effusion. No pneumothorax. Mildly increased bronchitic markings bilaterally. No focal consolidation. Visualized abdomen is unremarkable. IMPRESSION: Mildly increased bronchitic markings bilaterally. This is nonspecific and could reflect bronchitis or mild underlying pulmonary edema. Electronically Signed   By: Valentino Saxon M.D.   On: 12/26/2021 19:37   CT HEAD WO CONTRAST  Result Date: 12/26/2021 CLINICAL DATA:  Neuro deficit, acute, stroke suspected EXAM: CT HEAD WITHOUT CONTRAST TECHNIQUE: Contiguous axial images were obtained from the base of the skull through the vertex without intravenous contrast. RADIATION DOSE REDUCTION: This exam was performed according to the departmental dose-optimization program which includes automated exposure control, adjustment of the mA and/or kV according to patient size and/or use of iterative reconstruction technique. COMPARISON:  CT November 30, 2021. FINDINGS: Brain: New/interval infarct in the posterior right frontal lobe, potentially acute. No evidence of acute hemorrhage, hydrocephalus, extra-axial collection or mass lesion/mass effect. Additional remote right frontal and parietal infarcts. Additional patchy white matter hypodensities, nonspecific but compatible with chronic microvascular ischemic disease. Vascular: No hyperdense vessel.  Calcific atherosclerosis. Skull: No acute fracture. Sinuses/Orbits: No acute finding. Other: No mastoid effusions. IMPRESSION: New/interval infarct in the posterior right frontal lobe, potentially acute. Recommend MRI for further evaluation. Electronically Signed   By: Margaretha Sheffield M.D.   On: 12/26/2021 15:01    PHYSICAL EXAM General: Frail-appearing elderly Caucasian patient in no  acute distress Respiratory: Regular, unlabored respirations on room air  NEURO:  Mental Status: Drowsy but can be easily aroused.  Oriented to place and person follows simple commands Speech/Language: speech is dysarthric. Cranial Nerves:  II: PERRL. Visual fields full.  III, IV, VI: EOMI.  V: Sensation is intact to light touch and symmetrical to face.  VII: Smile is symmetrical.  VIII: hearing intact to voice. IX, X: Phonation is normal.  XII: tongue is midline without fasciculations. Motor: Normal antigravity strength to right upper and lower extremities, no movement of left upper extremity, withdraws lleft lower extremity to noxious stimuli Tone: is normal and bulk is normal Sensation- Intact to light touch with left-sided neglect noted Gait- deferred     ASSESSMENT/PLAN Wanda [name] is a 81 y.o. female with history of diabetes, recent stroke in July, hypertension, hyperlipidemia and pulmonary embolus on Eliquis presenting with acute onset left upper extremity weakness which occurred at her assisted living facility on Thursday.  Workup for medical causes of this weakness was undergone, and she was taken to the ER yesterday.  CT head taken in the emergency department was concerning for right frontal cortical stroke.  She was outside the window for both TNK and mechanical thrombectomy.  MRI brain shows restricted diffusion in right MCA territory.  MRA head and neck shows severe stenosis at origin of right ICA, and interventional radiology has been consulted for possible stenting of this vessel.  Stroke:  right MCA territory stroke Etiology: Symptomatic severe right ICA stenosis with near occlusion CT head new infarct in posterior right frontal lobe MRI restricted diffusion in right MCA territory and posterior right frontal lobe with additional involvement of right insula anterior right frontal lobe and right parietal lobe MRA head and neck: Severe stenosis at origin of right  ICA Carotid Doppler 1 to 39% stenosis of left ICA, 80 to 99% stenosis of right ICA 2D Echo EF  greater than 75%, mild basal septal LVH, grade 1 diastolic dysfunction, normal left atrial size, normal atrial septum LDL 120 HgbA1c 11.1 VTE prophylaxis -fully anticoagulated with IV heparin    Diet   Diet NPO time specified   Eliquis (apixaban) daily prior to admission, now on heparin IV.  Therapy recommendations: Pending Disposition: Pending  Severe stenosis of right ICA Radiographic string sign seen on MRI Severe stenosis of right ICA seen on carotid ultrasound Interventional radiology consulted for potential stenting of this vessel  Hypertension Home meds: Amlodipine 5 mg daily, clonidine 0.1 mg twice daily Stable Permissive hypertension (OK if < 220/120) but gradually normalize in 5-7 days Long-term BP goal normotensive  Hyperlipidemia Home meds: None LDL 120, goal < 70 Add ezetimibe 10 mg daily High intensity statin not indicated due to previous intolerance Consider lipid clinic referral  Diabetes type II Uncontrolled Home meds: Insulin aspart 3 units with dinner with meals insulin, glargine 30 units daily HgbA1c 11.1, goal < 7.0 CBGs Recent Labs    12/27/21 0748 12/27/21 1224  GLUCAP 202* 211*  Diabetes coordinator consult [facility]SI  Other Stroke Risk Factors Advanced Age >/= 71  Hx stroke  Other Active Problems None  Hospital day # [place] , MSN, AGACNP-BC [facility] See Amion for schedule and pager information 12/27/2021 1:12 PM   I have personally obtained history,examined this patient, reviewed notes, independently viewed imaging studies, participated in medical decision making and plan of care.ROS completed by me personally and pertinent positives fully documented  I have made any additions or clarifications directly to the above note. Agree with note above.  Patient has presented with left hemiparesis secondary to his right MCA  infarct due to symptomatic severe proximal right carotid stenosis.  She actually had similar episode in July and was recommended carotid which will help prevent further strokes but was felt not to be a vascular surgery candidate but patient declined carotid stenting by neurointerventional radiology at that time.  Neurological exam shows significant left hemiplegia and dysarthria.  I had a long discussion with the patient as well as her daughter over the phone regarding risk-benefit of right carotid stenting which will prevent further strokes but will not necessarily improve her neurological deficits from the current stroke and will also carry a small risk of hemorrhagic transformation into her present stroke.  The problem is if you wait too long with near occlusive right carotid may become completely occluded and it may be too late to revascularize.  Patient and daughter wish to proceed with right carotid stenting and I have consulted Dr. Estanislado Pandy from neurointerventional radiology who will do this procedure in the next few days.  Continue to hold Eliquis and use IV heparin till patient can swallow safely.  Discussed with Dr. Alfredia Ferguson Greater than 50% time during this 50-minute visit was spent on counseling and coordination of care and about her symptomatic carotid stenosis and discussion of risk benefits of carotid revascularization and answering questions. Antony Contras, MD Medical Director [facility] Pager: [phone] 12/27/2021 3:44 PM  To contact Stroke Continuity provider, please refer to [email or web address] After hours, contact General Neurology

## 2021-12-28 ENCOUNTER — Other Ambulatory Visit (HOSPITAL_COMMUNITY): Payer: Medicare Other

## 2021-12-28 ENCOUNTER — Encounter (HOSPITAL_COMMUNITY)
Admission: EM | Disposition: A | Discharge status: Skilled Nursing Facility | Payer: Self-pay | Source: Skilled Nursing Facility | Attending: Internal Medicine

## 2021-12-28 ENCOUNTER — Inpatient Hospital Stay (HOSPITAL_COMMUNITY): Payer: Medicare Other

## 2021-12-28 DIAGNOSIS — I63511 Cerebral infarction due to unspecified occlusion or stenosis of right middle cerebral artery: Secondary | ICD-10-CM

## 2021-12-28 DIAGNOSIS — I10 Essential (primary) hypertension: Secondary | ICD-10-CM | POA: Diagnosis not present

## 2021-12-28 DIAGNOSIS — I63231 Cerebral infarction due to unspecified occlusion or stenosis of right carotid arteries: Secondary | ICD-10-CM

## 2021-12-28 DIAGNOSIS — I69354 Hemiplegia and hemiparesis following cerebral infarction affecting left non-dominant side: Secondary | ICD-10-CM | POA: Diagnosis not present

## 2021-12-28 LAB — CBC WITH DIFFERENTIAL/PLATELET
Abs Immature Granulocytes: 0.08 10*3/uL — ABNORMAL HIGH (ref 0.00–0.07)
Basophils Absolute: 0 10*3/uL (ref 0.0–0.1)
Basophils Relative: 0 %
Eosinophils Absolute: 0 10*3/uL (ref 0.0–0.5)
Eosinophils Relative: 0 %
HCT: 34.8 % — ABNORMAL LOW (ref 36.0–46.0)
Hemoglobin: 11.6 g/dL — ABNORMAL LOW (ref 12.0–15.0)
Immature Granulocytes: 1 %
Lymphocytes Relative: 6 %
Lymphs Abs: 0.7 10*3/uL (ref 0.7–4.0)
MCH: 30.8 pg (ref 26.0–34.0)
MCHC: 33.3 g/dL (ref 30.0–36.0)
MCV: 92.3 fL (ref 80.0–100.0)
Monocytes Absolute: 0.9 10*3/uL (ref 0.1–1.0)
Monocytes Relative: 7 %
Neutro Abs: 10.4 10*3/uL — ABNORMAL HIGH (ref 1.7–7.7)
Neutrophils Relative %: 86 %
Platelets: 303 10*3/uL (ref 150–400)
RBC: 3.77 MIL/uL — ABNORMAL LOW (ref 3.87–5.11)
RDW: 13.9 % (ref 11.5–15.5)
WBC: 12.1 10*3/uL — ABNORMAL HIGH (ref 4.0–10.5)
nRBC: 0 % (ref 0.0–0.2)

## 2021-12-28 LAB — GLUCOSE, CAPILLARY
Glucose-Capillary: 196 mg/dL — ABNORMAL HIGH (ref 70–99)
Glucose-Capillary: 207 mg/dL — ABNORMAL HIGH (ref 70–99)
Glucose-Capillary: 213 mg/dL — ABNORMAL HIGH (ref 70–99)
Glucose-Capillary: 299 mg/dL — ABNORMAL HIGH (ref 70–99)
Glucose-Capillary: 330 mg/dL — ABNORMAL HIGH (ref 70–99)

## 2021-12-28 LAB — PHOSPHORUS: Phosphorus: 2.3 mg/dL — ABNORMAL LOW (ref 2.5–4.6)

## 2021-12-28 LAB — COMPREHENSIVE METABOLIC PANEL
ALT: 12 U/L (ref 0–44)
AST: 13 U/L — ABNORMAL LOW (ref 15–41)
Albumin: 2.9 g/dL — ABNORMAL LOW (ref 3.5–5.0)
Alkaline Phosphatase: 62 U/L (ref 38–126)
Anion gap: 16 — ABNORMAL HIGH (ref 5–15)
BUN: 14 mg/dL (ref 8–23)
CO2: 13 mmol/L — ABNORMAL LOW (ref 22–32)
Calcium: 9.1 mg/dL (ref 8.9–10.3)
Chloride: 109 mmol/L (ref 98–111)
Creatinine, Ser: 0.88 mg/dL (ref 0.44–1.00)
GFR, Estimated: >60 mL/min (ref >60)
Glucose, Bld: 248 mg/dL — ABNORMAL HIGH (ref 70–99)
Potassium: 3.7 mmol/L (ref 3.5–5.1)
Sodium: 138 mmol/L (ref 135–145)
Total Bilirubin: 1.2 mg/dL (ref 0.3–1.2)
Total Protein: 6.4 g/dL — ABNORMAL LOW (ref 6.5–8.1)

## 2021-12-28 LAB — MAGNESIUM: Magnesium: 1.9 mg/dL (ref 1.7–2.4)

## 2021-12-28 LAB — HEPARIN LEVEL (UNFRACTIONATED): Heparin Unfractionated: >1.1 IU/mL — ABNORMAL HIGH (ref 0.30–0.70)

## 2021-12-28 LAB — HEMOGLOBIN A1C
Hgb A1c MFr Bld: 10.4 % — ABNORMAL HIGH (ref 4.8–5.6)
Mean Plasma Glucose: 252 mg/dL

## 2021-12-28 LAB — MRSA NEXT GEN BY PCR, NASAL: MRSA by PCR Next Gen: NEGATIVE — AB

## 2021-12-28 LAB — APTT: aPTT: 80 seconds — ABNORMAL HIGH (ref 24–36)

## 2021-12-28 SURGERY — IR WITH ANESTHESIA
Anesthesia: General

## 2021-12-28 MED ORDER — RIVAROXABAN 20 MG PO TABS
20.0000 mg | ORAL_TABLET | Freq: Every day | ORAL | Status: DC
  Administered 2021-12-28 – 2022-01-03 (×7): 20 mg via ORAL
  Filled 2021-12-28 (×7): qty 1

## 2021-12-28 MED ORDER — FENTANYL CITRATE (PF) 100 MCG/2ML IJ SOLN
INTRAMUSCULAR | Status: AC
  Filled 2021-12-28: qty 2

## 2021-12-28 MED ORDER — INSULIN ASPART 100 UNIT/ML IJ SOLN
0.0000 [IU] | INTRAMUSCULAR | Status: DC
  Administered 2021-12-28: 3 [IU] via SUBCUTANEOUS

## 2021-12-28 MED ORDER — INSULIN ASPART 100 UNIT/ML IJ SOLN
0.0000 [IU] | Freq: Three times a day (TID) | INTRAMUSCULAR | Status: DC
  Administered 2021-12-29: 11 [IU] via SUBCUTANEOUS
  Administered 2021-12-29: 3 [IU] via SUBCUTANEOUS
  Administered 2021-12-29: 5 [IU] via SUBCUTANEOUS
  Administered 2021-12-30 (×2): 3 [IU] via SUBCUTANEOUS
  Administered 2021-12-31: 5 [IU] via SUBCUTANEOUS
  Administered 2021-12-31 – 2022-01-02 (×3): 2 [IU] via SUBCUTANEOUS
  Administered 2022-01-02: 3 [IU] via SUBCUTANEOUS

## 2021-12-28 MED ORDER — PROPOFOL 1000 MG/100ML IV EMUL
INTRAVENOUS | Status: AC
  Filled 2021-12-28: qty 200

## 2021-12-28 MED ORDER — RIVAROXABAN (XARELTO) EDUCATION KIT FOR DVT/PE PATIENTS
PACK | Freq: Once | Status: AC
  Filled 2021-12-28: qty 1

## 2021-12-28 MED ORDER — LIVING WELL WITH DIABETES BOOK
Freq: Once | Status: AC
  Filled 2021-12-28: qty 1

## 2021-12-28 MED ORDER — PHENYLEPHRINE HCL-NACL 20-0.9 MG/250ML-% IV SOLN
INTRAVENOUS | Status: AC
  Filled 2021-12-28: qty 500

## 2021-12-28 MED ORDER — INSULIN GLARGINE-YFGN 100 UNIT/ML ~~LOC~~ SOLN
20.0000 [IU] | Freq: Every day | SUBCUTANEOUS | Status: DC
  Administered 2021-12-28: 20 [IU] via SUBCUTANEOUS
  Filled 2021-12-28 (×2): qty 0.2

## 2021-12-28 NOTE — Progress Notes (Addendum)
Discussion with daughter, [name], this am.  She and family have decided not to proceed with angiogram or stenting at this time.  Confirmed with daughter she understood without proceeding they can expect additional strokes, continued decrease in quality of life, and possibly death.  She voiced understanding.  Procedure and all related orders have been canceled.   Electronically Signed: Pasty Spillers, PA-C 12/28/2021, 9:01 AM

## 2021-12-28 NOTE — Progress Notes (Signed)
STROKE TEAM PROGRESS NOTE   INTERVAL HISTORY Patient is seen in her room with no family at the bedside.   Patient and daughter do not want aggressive treatment with angioplasty and stenting and decline the procedure after being informed clearly about risk-benefit and patient's quality of life and risk for future stroke.  They want to see her condition over the next couple of days if she declines further they may lean towards comfort care. Vitals:   12/28/21 0410 12/28/21 0847 12/28/21 0900 12/28/21 1255  BP: (!) 162/62 (!) 166/69 (!) 146/72   Pulse:  91  (!) 106  Resp:  17  16  Temp: 97.7 F (36.5 C) (!) 96.9 F (36.1 C)  98.2 F (36.8 C)  TempSrc: Oral Axillary  Axillary  SpO2: 98% 98%  99%  Weight:      Height:       CBC:  Recent Labs  Lab 12/26/21 1435 12/26/21 1455 12/27/21 0644 12/28/21 0336  WBC 12.8*  --  14.1* 12.1*  NEUTROABS 10.3*  --   --  10.4*  HGB 14.7   < > 12.3 11.6*  HCT 46.2*   < > 35.9* 34.8*  MCV 94.7  --  90.7 92.3  PLT 274  --  291 303   < > = values in this interval not displayed.   Basic Metabolic Panel:  Recent Labs  Lab 12/27/21 0645 12/28/21 0336  NA 139 138  K 3.4* 3.7  CL 106 109  CO2 19* 13*  GLUCOSE 172* 248*  BUN 15 14  CREATININE 0.78 0.88  CALCIUM 9.1 9.1  MG 1.5* 1.9  PHOS 3.0 2.3*   Lipid Panel:  Recent Labs  Lab 12/27/21 0645  CHOL 175  TRIG 92  HDL 37*  CHOLHDL 4.7  VLDL 18  LDLCALC 120*   HgbA1c:  Recent Labs  Lab 12/27/21 0644  HGBA1C 10.4*   Urine Drug Screen: No results for input(s): "LABOPIA", "COCAINSCRNUR", "LABBENZ", "AMPHETMU", "THCU", "LABBARB" in the last 168 hours.  Alcohol Level  Recent Labs  Lab 12/26/21 1435  ETH <10    IMAGING past 24 hours DG Abd 1 View  Result Date: 12/27/2021 CLINICAL DATA:  NG tube placement EXAM: ABDOMEN - 1 VIEW COMPARISON:  CT 11/30/2021 FINDINGS: Esophageal tube tip overlies the proximal to mid stomach. Nonobstructed gas pattern. IMPRESSION: Esophageal tube tip  overlies the proximal to mid stomach. Electronically Signed   By: Donavan Foil M.D.   On: 12/27/2021 20:26    PHYSICAL EXAM General: Frail-appearing elderly Caucasian patient in no acute distress Respiratory: Regular, unlabored respirations on room air  NEURO:  Mental Status: Awake and alert oriented to place and person follows simple commands Speech/Language: speech is dysarthric. Cranial Nerves:  II: PERRL. Visual fields full.  III, IV, VI: EOMI.  V: Sensation is intact to light touch and symmetrical to face.  VII: Smile is symmetrical.  VIII: hearing intact to voice. IX, X: Phonation is normal.  XII: tongue is midline without fasciculations. Motor: Normal antigravity strength to right upper and lower extremities, no movement of left upper extremity, withdraws lleft lower extremity to noxious stimuli Tone: is normal and bulk is normal Sensation- Intact to light touch with left-sided neglect noted Gait- deferred     ASSESSMENT/PLAN Wanda [name] is a 81 y.o. female with history of diabetes, recent stroke in July, hypertension, hyperlipidemia and pulmonary embolus on Eliquis presenting with acute onset left upper extremity weakness which occurred at her assisted living facility  on Thursday.  Workup for medical causes of this weakness was undergone, and she was taken to the ER yesterday.  CT head taken in the emergency department was concerning for right frontal cortical stroke.  She was outside the window for both TNK and mechanical thrombectomy.  MRI brain shows restricted diffusion in right MCA territory.  MRA head and neck shows severe stenosis at origin of right ICA, and interventional radiology has been consulted for possible stenting of this vessel.  Stroke:  right MCA territory stroke Etiology: Symptomatic severe right ICA stenosis with near occlusion CT head new infarct in posterior right frontal lobe MRI restricted diffusion in right MCA territory and posterior right  frontal lobe with additional involvement of right insula anterior right frontal lobe and right parietal lobe MRA head and neck: Severe stenosis at origin of right ICA Carotid Doppler 1 to 39% stenosis of left ICA, 80 to 99% stenosis of right ICA 2D Echo EF greater than 75%, mild basal septal LVH, grade 1 diastolic dysfunction, normal left atrial size, normal atrial septum LDL 120 HgbA1c 11.1 VTE prophylaxis -fully anticoagulated with IV heparin    There are no active orders of the following types: Diet, Nourishments.   Eliquis (apixaban) daily prior to admission, now on heparin IV.  Therapy recommendations: Pending Disposition: Pending  Severe stenosis of right ICA Radiographic string sign seen on MRI Severe stenosis of right ICA seen on carotid ultrasound Interventional radiology consulted for potential stenting of this vessel  Hypertension Home meds: Amlodipine 5 mg daily, clonidine 0.1 mg twice daily Stable Permissive hypertension (OK if < 220/120) but gradually normalize in 5-7 days Long-term BP goal normotensive  Hyperlipidemia Home meds: None LDL 120, goal < 70 Add ezetimibe 10 mg daily High intensity statin not indicated due to previous intolerance Consider lipid clinic referral  Diabetes type II Uncontrolled Home meds: Insulin aspart 3 units with dinner with meals insulin, glargine 30 units daily HgbA1c 11.1, goal < 7.0 CBGs Recent Labs    12/28/21 0022 12/28/21 0844 12/28/21 1251  GLUCAP 213* 330* 299*  Diabetes coordinator consult [facility]  Other Stroke Risk Factors Advanced Age >/= 100  Hx stroke  Other Active Problems None  Hospital day # 2  Patient has presented with left hemiparesis secondary to his right MCA infarct due to symptomatic severe proximal right carotid stenosis.  She actually had similar episode in July and was recommended carotid which will help prevent further strokes but was felt not to be a vascular surgery candidate but patient declined  carotid stenting by neurointerventional radiology at that time as well as at the present time as well.  Neurological exam shows significant left hemiplegia and dysarthria.  I had a long discussion with the patient as well as her daughter over the phone yesterday regarding risk-benefit of right carotid stenting which will prevent further strokes but will not necessarily improve her neurological deficits from the current stroke and will also carry a small risk of hemorrhagic transformation into her present stroke.   Patient and daughter wish not to proceed with right carotid stenting General leaning towards comfort care if her condition declines further and would like to wait for a few days to make a decision.  Continue to hold Eliquis and use IV heparin till patient can swallow safely.  Stroke team will sign off.  Kindly call for questions discussed with Dr. [place] Greater than 50% time during this 35-minute visit was spent on counseling and coordination of care and about her  symptomatic carotid stenosis and discussion of risk benefits of carotid revascularization and answering questions. Antony Contras, MD Medical Director [facility] Stroke Center Pager: [phone] 12/28/2021 1:07 PM  To contact Stroke Continuity provider, please refer to [email or web address] After hours, contact General Neurology

## 2021-12-28 NOTE — Progress Notes (Signed)
She is also unable to lift her head up and straighten it. It is falling to the right. She has left side paralysis acutely from the stroke, but at shift change, she was upright eating and could support her head upright.  Notified provider.

## 2021-12-28 NOTE — Progress Notes (Signed)
Patient vomited no signs of aspiration at this time, sats 96% on room air and HOB @ 35 degrees.  No nausea at this time.  BP 170/70, then 161/66, after hydralazine '5mg'$  prn dose. Provider notified.

## 2021-12-28 NOTE — Evaluation (Signed)
Speech Language Pathology Evaluation Patient Details Name: Wanda [name] MRN: [id number] DOB: 1940-02-23 Today's Date: 12/28/2021 Time: 2263-3354 SLP Time Calculation (min) (ACUTE ONLY): 16 min  Problem List:  Patient Active Problem List   Diagnosis Date Noted   Acute DVT (deep venous thrombosis) ([place]) 12/01/2021   Elevated liver enzymes 12/01/2021   History of CVA (cerebrovascular accident) 12/01/2021   Mood disorder ([place]) 12/01/2021   DVT (deep venous thrombosis) ([place]) 12/01/2021   Hypokalemia 07/26/2021   Fever 07/25/2021   Stroke (cerebrum) ([place]) 07/24/2021   Carotid stenosis 11/13/2018   Brainstem infarct, acute ([place])    Primary osteoarthritis of left knee 10/04/2015   Osteoporosis 08/23/2015   Multiple thyroid nodules 04/07/2014   Insulin dependent type 2 diabetes mellitus ([place]) 10/09/2013   Insulin adverse reaction 09/27/2013   Senile nuclear sclerosis 08/14/2012   Essential hypertension 03/10/2011   Hyperlipidemia with target LDL less than 70 03/10/2011   Obesity (BMI 30-39.9) 03/10/2011   Past Medical History:  Past Medical History:  Diagnosis Date   Arthritis    Carotid stenosis 11/2018   Cataract    surgery to remove   Diabetes mellitus    type 2   Hyperlipidemia    Hypertension    Skin cancer    Removed from face   Stroke ([place]) 09/19/2018   Urgency of urination    Urinary leakage    Past Surgical History:  Past Surgical History:  Procedure Laterality Date   ABDOMINAL HYSTERECTOMY  early 80's   total   CAROTID ENDARTERECTOMY Left 11/13/2018   CARPAL TUNNEL RELEASE Bilateral 1980 and 1981   both hands   ENDARTERECTOMY Left 11/13/2018   Procedure: Left Carotid Artery Endarterectomy with Patch Angioplasty;  Surgeon: Rosetta Posner, MD;  Location: [facility] OR;  Service: Vascular;  Laterality: Left;   EYE SURGERY     bilateral cataracts   Fatty Tumor Excision     JOINT REPLACEMENT     TONSILLECTOMY     TONSILLECTOMY AND ADENOIDECTOMY  age 74   TOTAL  KNEE ARTHROPLASTY Right 10/04/2015   TOTAL KNEE ARTHROPLASTY Right 10/04/2015   Procedure: TOTAL KNEE ARTHROPLASTY;  Surgeon: Dorna Leitz, MD;  Location: [place];  Service: Orthopedics;  Laterality: Right;   TOTAL KNEE ARTHROPLASTY Left 01/28/2016   Procedure: TOTAL KNEE ARTHROPLASTY;  Surgeon: Dorna Leitz, MD;  Location: [place];  Service: Orthopedics;  Laterality: Left;   TUBAL LIGATION     HPI:  Pt is an 81 y.o. female who presented for evaluation of worsening left-sided weakness. MRI brain: acute infarcts in the right MCA territory, with the largest area of infarction in the posterior right frontal lobe, but with additional involvement of the right insula, anterior right frontal lobe, and right parietal lobe. PMH: recent diagnosed DVT on Eliquis, recent stroke in July 2023 on Plavix, HTN, HLD. Pt seen by SLP 07/25/21 with noted impairments in memory, orientation, and problem solving/executive function.   Assessment / Plan / Recommendation Clinical Impression  Pt participated in speech-language-cognition evaluation. Pt reported that she lives alone and was independent with medication and financial management prior to admission. She denied any baseline deficits in speech, language, or cognition.  She initially denied any acute changes in these areas, but subsequently stated that "this is strange" and endorsed having more difficulty with cognitive tasks than she would have expected. [facility] Mental Status Examination was completed to evaluate the pt's cognitive-linguistic skills. She achieved a score of 10/30 which is below the normal limits  of 27 or more out of 30. She exhibited difficulty in the areas of temporal orientation, awareness, attention, memory, and executive function. She also presented with mild dysarthria characterized by reduction in articulatory precision and vocal intensity which negatively impacted speech intelligibility at the conversational level. Pt is known to speech  pathology and dysarthria appears similar to that described in July.  Pt's cognitive performance also appears similar to that noted in July, but no family was present to attest to whether there had been improvement since July. SLP services will be continued to target cognition unless it is subsequently determined that she is at baseline.    SLP Assessment  SLP Recommendation/Assessment: Patient needs continued Speech Lanaguage Pathology Services SLP Visit Diagnosis: Cognitive communication deficit (R41.841);Dysarthria and anarthria (R47.1)    Recommendations for follow up therapy are one component of a multi-disciplinary discharge planning process, led by the attending physician.  Recommendations may be updated based on patient status, additional functional criteria and insurance authorization.    Follow Up Recommendations   (Continued skilled SLP services at level of care recommended by PT/OT)    Assistance Recommended at Discharge  Frequent or constant Supervision/Assistance  Functional Status Assessment Patient has had a recent decline in their functional status and demonstrates the ability to make significant improvements in function in a reasonable and predictable amount of time.  Frequency and Duration min 2x/week  2 weeks      SLP Evaluation Cognition  Overall Cognitive Status: No family/caregiver present to determine baseline cognitive functioning Arousal/Alertness: Awake/alert Orientation Level: Oriented to person;Disoriented to time;Disoriented to place Year: 2023 Month:  (denied) Day of Week: Incorrect Attention: Sustained;Focused Focused Attention: Appears intact Sustained Attention: Impaired Sustained Attention Impairment: Verbal complex Memory: Impaired Memory Impairment: Storage deficit;Decreased recall of new information (Immediate: 5/5 with repetition x4; delayed: 0/5; with cues: 5/5) Awareness: Impaired Awareness Impairment: Intellectual impairment Problem Solving:  Impaired Problem Solving Impairment: Verbal complex (money:) Executive Function: Sequencing;Organizing Sequencing: Impaired Sequencing Impairment: Verbal complex (clock: 0/4) Organizing: Impaired Organizing Impairment: Verbal complex (backward digit span: 2/2)       Comprehension  Auditory Comprehension Overall Auditory Comprehension: Appears within functional limits for tasks assessed Yes/No Questions: Within Functional Limits Commands: Within Functional Limits Conversation: Complex Reading Comprehension Reading Status: Within funtional limits    Expression Expression Primary Mode of Expression: Verbal Verbal Expression Overall Verbal Expression: Appears within functional limits for tasks assessed Initiation: No impairment   Oral / Motor  Oral Motor/Sensory Function Overall Oral Motor/Sensory Function: Moderate impairment Facial ROM: Reduced left;Suspected CN VII (facial) dysfunction Facial Symmetry: Abnormal symmetry left;Suspected CN VII (facial) dysfunction Facial Strength: Reduced left;Suspected CN VII (facial) dysfunction Facial Sensation: Within Functional Limits Lingual ROM: Reduced right;Reduced left;Suspected CN XII (hypoglossal) dysfunction Lingual Strength: Reduced;Suspected CN XII (hypoglossal) dysfunction Motor Speech Overall Motor Speech: Impaired at baseline Respiration: Within functional limits Phonation: Low vocal intensity Articulation: Impaired Level of Impairment: Conversation Intelligibility: Intelligibility reduced Word: 75-100% accurate Phrase: 75-100% accurate Sentence: 75-100% accurate Conversation: 75-100% accurate Motor Planning: Witnin functional limits Motor Speech Errors: Consistent           Aviona Martenson I. Hardin Negus, [place], [place] Office number [phone]  MADDUX FIRST 12/28/2021, 2:50 PM

## 2021-12-28 NOTE — Consult Note (Signed)
ANTICOAGULATION CONSULT NOTE - Initial Consult  Pharmacy Consult for Xarelto   Indication: DVT  Allergies  Allergen Reactions   Ace Inhibitors Diarrhea, Swelling, Other (See Comments) and Cough    Pt had cough and diarrhea with first few doses of medication; also had swelling of right eyelid. Stopped medication on 03/17/11.   Alendronate Sodium Other (See Comments)    "Made my whole body hurt"   Aspirin Hives   Crestor [Rosuvastatin] Other (See Comments)    "Made my whole body hurt"   Lipitor [Atorvastatin Calcium] Other (See Comments)    "Made my whole body hurt"   Metformin And Related     Severe abdominal pain   Shellfish Allergy Nausea Only    Intolerant of fresh shellfish, reports the reaction is GI upset, denies hives, denies any swelling  Reports that she can tolerate canned seafood.    Statins Other (See Comments)    myalgia    Patient Measurements: Height: '5\' 1"'$  (154.9 cm) Weight: 67 kg (147 lb 11.3 oz) IBW/kg (Calculated) : 47.8   Vital Signs: Temp: 98.3 F (36.8 C) (12/20 1531) Temp Source: Axillary (12/20 1531) BP: 165/58 (12/20 1531) Pulse Rate: 90 (12/20 1531)  Labs: Recent Labs    12/26/21 1435 12/26/21 1455 12/27/21 0644 12/27/21 0645 12/27/21 1400 12/27/21 1430 12/28/21 0336  HGB 14.7 14.6 12.3  --   --   --  11.6*  HCT 46.2* 43.0 35.9*  --   --   --  34.8*  PLT 274  --  291  --   --   --  303  APTT 29  --  84*  --  75*  --  80*  LABPROT 15.8*  --   --   --   --   --   --   INR 1.3*  --   --   --   --   --   --   HEPARINUNFRC  --   --   --  >1.10*  --  >1.10* >1.10*  CREATININE 0.78 0.60  --  0.78  --   --  0.88    Estimated Creatinine Clearance: 43.9 mL/min (by C-G formula based on SCr of 0.88 mg/dL).   Medical History: Past Medical History:  Diagnosis Date   Arthritis    Carotid stenosis 11/2018   Cataract    surgery to remove   Diabetes mellitus    type 2   Hyperlipidemia    Hypertension    Skin cancer    Removed from face    Stroke [facility]) 09/19/2018   Urgency of urination    Urinary leakage     Medications:  Scheduled:   ezetimibe  10 mg Oral Daily   [START ON 12/29/2021] insulin aspart  0-15 Units Subcutaneous TID WC   insulin glargine-yfgn  20 Units Subcutaneous Daily   rivaroxaban   Does not apply Once   rivaroxaban  20 mg Oral Q supper   venlafaxine XR  75 mg Oral Q breakfast    Assessment: Patient was in hospital in July 2023 for CVA. On heparin for HX of PE 11/2021. Family request change from PTA Eliquis to Xarelto.  Goal of Therapy:    Monitor platelets by anticoagulation protocol: Yes   Plan:  Stop heparin '@2000'$  12/20 Begin Xarelto '20mg'$  '@2000'$  as well. Further labs to be done as indicated.  Berta Minor 12/28/2021,7:40 PM

## 2021-12-28 NOTE — Progress Notes (Signed)
[place] for heparin  Indication: DVT, hx of PE 11/2021 on Eliquis PTA   Allergies  Allergen Reactions   Ace Inhibitors Diarrhea, Swelling, Other (See Comments) and Cough    Pt had cough and diarrhea with first few doses of medication; also had swelling of right eyelid. Stopped medication on 03/17/11.   Alendronate Sodium Other (See Comments)    "Made my whole body hurt"   Aspirin Hives   Crestor [Rosuvastatin] Other (See Comments)    "Made my whole body hurt"   Lipitor [Atorvastatin Calcium] Other (See Comments)    "Made my whole body hurt"   Metformin And Related     Severe abdominal pain   Shellfish Allergy Nausea Only    Intolerant of fresh shellfish, reports the reaction is GI upset, denies hives, denies any swelling  Reports that she can tolerate canned seafood.    Statins Other (See Comments)    myalgia    Patient Measurements: Height: '5\' 1"'$  (154.9 cm) Weight: 67 kg (147 lb 11.3 oz) IBW/kg (Calculated) : 47.8  Heparin Dosing Weight: 62 kg   Vital Signs: Temp: 97.7 F (36.5 C) (12/20 0410) Temp Source: Oral (12/20 0410) BP: 162/62 (12/20 0410) Pulse Rate: 115 (12/19 2148)  Labs: Recent Labs    12/26/21 1435 12/26/21 1455 12/27/21 0644 12/27/21 0645 12/27/21 1400 12/27/21 1430 12/28/21 0336  HGB 14.7 14.6 12.3  --   --   --  11.6*  HCT 46.2* 43.0 35.9*  --   --   --  34.8*  PLT 274  --  291  --   --   --  303  APTT 29  --  84*  --  75*  --  80*  LABPROT 15.8*  --   --   --   --   --   --   INR 1.3*  --   --   --   --   --   --   HEPARINUNFRC  --   --   --  >1.10*  --  >1.10* >1.10*  CREATININE 0.78 0.60  --  0.78  --   --  0.88    Estimated Creatinine Clearance: 43.9 mL/min (by C-G formula based on SCr of 0.88 mg/dL).   Medical History: Past Medical History:  Diagnosis Date   Arthritis    Carotid stenosis 11/2018   Cataract    surgery to remove   Diabetes mellitus    type 2   Hyperlipidemia     Hypertension    Skin cancer    Removed from face   Stroke [facility]) 09/19/2018   Urgency of urination    Urinary leakage     Assessment: Patient admitted with stroke like symptoms, head CT concerning for R frontal cortical stroke. She was out of the window for TNK. Patient on Eliquis PTA for PE 11/2021. Last dose unclear. Patient confused and unable to answer, called family x 2 with no response. Based on time of admission, has been at least 8 hours from last dose of Eliquis. Pharmacy consulted to dose heparin.   aPTT therapeutic at 80, HL still not correlating at > 1.10. Hgb stable. No issues with heparin infusion were noted. Per conversation with RN, there is continuous concern with ongoing trickling near her IV access site. Per RN, with pressure the bleeding has slowed down but continues to bleed if the pressure dressing is removed. Will reduce the rate of heparin empirically and target lower  end of goal. Notified MD.   Goal of Therapy:  Anti-Xa 0.3-0.5 aPTT 66-85 Monitor platelets by anticoagulation protocol: Yes   Plan:  Decrease heparin infusion to 700 units/hr Check heparin level in 8 hours and daily while on heparin Continue to monitor H&H and platelets  Thank you for allowing pharmacy to be a part of this patient's care.  Ardyth Harps, PharmD Clinical Pharmacist

## 2021-12-28 NOTE — Progress Notes (Signed)
PT Cancellation Note  Patient Details Name: Wanda [name] MRN: [id number] DOB: [birth year]   Cancelled Treatment:    Reason Eval/Treat Not Completed: Other (comment)  RN made aware pt is bleeding from wrist site. Pt also has vomited on herself. Will attempt to see later today as schedule permits.   [place]  Office [phone]  Rexanne Mano 12/28/2021, 10:25 AM

## 2021-12-28 NOTE — Inpatient Diabetes Management (Signed)
Inpatient Diabetes Program Recommendations  AACE/ADA: New Consensus Statement on Inpatient Glycemic Control (2015)  Target Ranges:  Prepandial:   less than 140 mg/dL      Peak postprandial:   less than 180 mg/dL (1-2 hours)      Critically ill patients:  140 - 180 mg/dL   Lab Results  Component Value Date   GLUCAP 330 (H) 12/28/2021   HGBA1C 10.4 (H) 12/27/2021    Review of Glycemic Control  Latest Reference Range & Units 12/27/21 07:48 12/27/21 12:24 12/27/21 17:51 12/27/21 19:50 12/28/21 00:22 12/28/21 08:44  Glucose-Capillary 70 - 99 mg/dL 202 (H) 211 (H) 167 (H) 174 (H) 213 (H) 330 (H)  (H): Data is abnormally high  Diabetes history: type 2 Outpatient Diabetes medications: Semglee 30 units daily, Novolog 3 units TID, Glipizide 2 tabs in am, 1 tab in pm Current orders for Inpatient glycemic control: Novolog 0-15 units TID   Inpatient Diabetes Program Recommendations:   Received diabetes coordinator consult for insulin dosage needs. Patient takes Semglee 30 units daily at home.    Recommend Semglee 20 units daily  Will continue to follow while inpatient.  Thank you, Reche Dixon, MSN, [place] Diabetes Coordinator Inpatient Diabetes Program [phone] (team pager from 8a-5p)

## 2021-12-28 NOTE — Progress Notes (Addendum)
PROGRESS NOTE    Wanda [name]  MWU:[id number] DOB: [birth year] DOA: 12/26/2021 PCP: Darreld Mclean, MD    Brief Narrative:   Wanda [name] is a 81 y.o. female with past medical history significant for HTN, HLD, recent DVT on Eliquis, recent CVA July 2023 on Plavix who presented to [facility] ED on 12/18 from [facility] complaining of left arm and leg weakness.  Onset of symptoms Thursday 12/22/21.  On the day of arrival to the ED, patient was also noted to have a left-sided facial droop with slurred speech.  In the ED, temperature 98.2 F, HR 107, RR 17, BP 162/78, SpO2 96% on room air.  Sodium 137, potassium 3.5, chloride 102, CO2 21, glucose 113, BUN 14, creatinine 0.78.  WBC 12.8, hemoglobin 14.7, platelets 274.  INR 1.3.  EtOH level less than 10.  CT head without contrast with new interval infarct posterior right frontal lobe.  MR brain and MRA head/neck with acute infarcts right MCA territory with the largest area of infarction posterior right frontal lobe with additional involvement of the right insula, anterior right frontal lobe and right parietal lobe, severe stenosis origin in the right ICA.  Neurology was consulted.  TRH consulted for admission for further evaluation and management of acute CVA.  Assessment & Plan:   Right MCA CVA, acute Symptomatic severe right ICA stenosis with near occlusion Patient presenting to ED with left-sided weakness, facial droop and slurred speech.  Imaging notable for acute infarcts right MCA territory with the largest area of infarction posterior right frontal lobe with additional involvement of the right insula, anterior right frontal lobe and right parietal lobe, severe stenosis origin in the right ICA.  Neurology was consulted and followed during hospital course.  Carotid Doppler with 1-39% stenosis of left ICA, 80-99 stenosis of right ICA.  TTE with LVEF greater than 75%, mild basal septal LVH, grade 1 diastolic dysfunction, normal left  atrial size, normal atrial septum.  LDL 120.  Hemoglobin A1c 10.4.  Interventional radiology was consulted for potential stenting of right ICA; but family and patient decided not to proceed with angiogram and stenting at this time as they are aware of risks to include decreased quality of life, additional strokes and possibly death.  Neurology now signed off.  Seen by speech therapy and passed swallow evaluation. -- Neurology following, appreciate assistance --OT: Recommends SNF -- PT evaluation: Pending -- Will transition heparin drip to Xarelto per daughter's request today  Acute metabolic encephalopathy Etiology likely secondary to large CVA, right frontal lobe.  Low suspicion for seizure.  Currently alert and oriented and appears mentation wise likely close to baseline. -- Urinalysis ordered -- Continue supportive care  Essential hypertension Home medications include amlodipine 5 mg p.o. daily, clonidine 0.1 mg p.o. twice daily. -- Continue to hold home and hypertensives, allow for permissive hypertension up to 220/120 with plan of gradual normalization of 5-7 days from initial presentation. -- Continue monitor BP closely  Hyperlipidemia LDL 120.  Intolerant to statins. -- Zetia 10 mg daily  Thyroid nodules Incidental finding on carotid ultrasound.  Hypomagnesemia Hypokalemia Repleted during hospitalization.  Type 2 diabetes mellitus, poorly controlled with hyperglycemia Home regimen includes insulin glargine 30 units daily, insulin aspart 3 units 3 times daily AC.  Hemoglobin A1c 10.4, poorly controlled. --Semglee 20 units subcutaneously daily --moderate SSI for coverage --CBGs q4h while NPO  Recent DVT on anticoagulation On Eliquis outpatient. -- Transition heparin drip to Xarelto today  Anxiety/depression: On venlafaxine outpatient.  DVT prophylaxis: Heparin drip    Code Status: DNR Family Communication: No family present in bed side  Disposition Plan:  Level of  care: Telemetry Medical Status is: Inpatient Remains inpatient appropriate because: Awaiting PT evaluation; will need SNF placement; [facility] consulted    Consultants:  Neurology Interventional radiology  Procedures:  TTE  Antimicrobials:  None   Subjective: Patient seen examined bedside, resting comfortably.  Lying in bed.  IV was pulled out overnight and patient with episode of vomitus this morning.  Patient was planned for angiogram with possible ICA stenting today however patient and family declined procedure.  Patient continues with dysarthria, left-sided hemiplegia but following commands appropriately.  Does not at bedside.  No other specific complaints at this time.  Denies headache, no chest pain, no shortness of breath, no abdominal pain.  No other acute events overnight per nursing staff.  Patient family declined intervention with angiography with likely need of stent placement.  Neurology now signed off.  Discussed with daughter will transition heparin drip to Xarelto per her request.  Patient previously on Eliquis.  Objective: Vitals:   12/27/21 2350 12/28/21 0115 12/28/21 0410 12/28/21 0847  BP: (!) 184/66 (!) 165/72 (!) 162/62 (!) 166/69  Pulse:    91  Resp:    17  Temp: 98.1 F (36.7 C)  97.7 F (36.5 C) (!) 96.9 F (36.1 C)  TempSrc: Oral  Oral Axillary  SpO2: 97%  98% 98%  Weight:      Height:       No intake or output data in the 24 hours ending 12/28/21 1208 Filed Weights   12/26/21 1800  Weight: 67 kg    Examination:  Physical Exam: GEN: NAD, alert and oriented x 3, chronically ill in appearance with notable dysarthria, left-sided hemiplegia HEENT: NCAT, PERRL, EOMI, sclera clear, MMM PULM: CTAB w/o wheezes/crackles, normal respiratory effort, on room air with SpO2 98% at rest CV: RRR w/o M/G/R GI: abd soft, NTND, NABS, no R/G/M MSK: no peripheral edema, muscle strength globally intact 5/5 bilateral upper/lower extremities NEURO: Positive dysarthria,  left-sided weakness/flaccid upper/lower extremity, moves right side spontaneously PSYCH: Depressed mood, flat affect Integumentary: dry/intact, no rashes or wounds    Data Reviewed: I have personally reviewed following labs and imaging studies  CBC: Recent Labs  Lab 12/26/21 1435 12/26/21 1455 12/27/21 0644 12/28/21 0336  WBC 12.8*  --  14.1* 12.1*  NEUTROABS 10.3*  --   --  10.4*  HGB 14.7 14.6 12.3 11.6*  HCT 46.2* 43.0 35.9* 34.8*  MCV 94.7  --  90.7 92.3  PLT 274  --  291 976   Basic Metabolic Panel: Recent Labs  Lab 12/26/21 1435 12/26/21 1455 12/27/21 0645 12/28/21 0336  NA 137 138 139 138  K 3.5 4.7 3.4* 3.7  CL 102 105 106 109  CO2 21*  --  19* 13*  GLUCOSE 113* 110* 172* 248*  BUN '14 20 15 14  '$ CREATININE 0.78 0.60 0.78 0.88  CALCIUM 10.1  --  9.1 9.1  MG  --   --  1.5* 1.9  PHOS  --   --  3.0 2.3*   GFR: Estimated Creatinine Clearance: 43.9 mL/min (by C-G formula based on SCr of 0.88 mg/dL). Liver Function Tests: Recent Labs  Lab 12/26/21 1435 12/27/21 0645 12/28/21 0336  AST 20 16 13*  ALT '13 12 12  '$ ALKPHOS 76 64 62  BILITOT 0.9 1.1 1.2  PROT 7.8 6.4* 6.4*  ALBUMIN 4.0 3.2* 2.9*  No results for input(s): "LIPASE", "AMYLASE" in the last 168 hours. No results for input(s): "AMMONIA" in the last 168 hours. Coagulation Profile: Recent Labs  Lab 12/26/21 1435  INR 1.3*   Cardiac Enzymes: No results for input(s): "CKTOTAL", "CKMB", "CKMBINDEX", "TROPONINI" in the last 168 hours. BNP (last 3 results) No results for input(s): "PROBNP" in the last 8760 hours. HbA1C: Recent Labs    12/27/21 0644  HGBA1C 10.4*   CBG: Recent Labs  Lab 12/27/21 1224 12/27/21 1751 12/27/21 1950 12/28/21 0022 12/28/21 0844  GLUCAP 211* 167* 174* 213* 330*   Lipid Profile: Recent Labs    12/27/21 0645  CHOL 175  HDL 37*  LDLCALC 120*  TRIG 92  CHOLHDL 4.7   Thyroid Function Tests: No results for input(s): "TSH", "T4TOTAL", "FREET4", "T3FREE",  "THYROIDAB" in the last 72 hours. Anemia Panel: No results for input(s): "VITAMINB12", "FOLATE", "FERRITIN", "TIBC", "IRON", "RETICCTPCT" in the last 72 hours. Sepsis Labs: No results for input(s): "PROCALCITON", "LATICACIDVEN" in the last 168 hours.  Recent Results (from the past 240 hour(s))  MRSA Next Gen by PCR, Nasal     Status: Abnormal   Collection Time: 12/28/21  7:05 AM   Specimen: Nasal Mucosa; Nasal Swab  Result Value Ref Range Status   MRSA by PCR Next Gen NEGATIVE (A) NOT DETECTED Final    Comment: Performed at [facility], [address] [place], [place] [address]         Radiology Studies: DG Abd 1 View  Result Date: 12/27/2021 CLINICAL DATA:  NG tube placement EXAM: ABDOMEN - 1 VIEW COMPARISON:  CT 11/30/2021 FINDINGS: Esophageal tube tip overlies the proximal to mid stomach. Nonobstructed gas pattern. IMPRESSION: Esophageal tube tip overlies the proximal to mid stomach. Electronically Signed   By: Donavan Foil M.D.   On: 12/27/2021 20:26   VAS US CAROTID  Result Date: 12/27/2021 Carotid Arterial Duplex Study Patient Name:  Asusena MARIE Zentner  Date of Exam:   12/27/2021 Medical Rec #: [id number]            Accession #:    [id number] Date of Birth: 24-May-1940             Patient Gender: F Patient Age:   23 years Exam Location:  [facility] Procedure:      VAS US CAROTID Referring Phys: PRAMOD SETHI --------------------------------------------------------------------------------  Indications:       CVA. Limitations        Today's exam was limited due to patient somnolence, snoring,                    coughing, and grabbing technologist hand/probe. Comparison Study:  Multiple prior imaging studies. MRA Neck 12/26/21                    demonstrated severe stenosis at the right ICA origin with                    poor flow signal in the right as compared to left.                    CTA Neck 08/03/21 demonstrated severe right ICA stenosis                     approaching string sign.                    Carotid Duplex 07/26/21 demonstrated flow velocities  consistent with 40-59% by EDV; however, 60-79% by ICA/CCA                    ratio, possibly higher in the setting of PSV of 383 cm/s. Performing Technologist: Darlin Coco RDMS, RVT  Examination Guidelines: A complete evaluation includes B-mode imaging, spectral Doppler, color Doppler, and power Doppler as needed of all accessible portions of each vessel. Bilateral testing is considered an integral part of a complete examination. Limited examinations for reoccurring indications may be performed as noted.  Right Carotid Findings: +----------+--------+--------+--------+------------------+-------------------+           PSV cm/sEDV cm/sStenosisPlaque DescriptionComments            +----------+--------+--------+--------+------------------+-------------------+ CCA Prox  59      7                                                     +----------+--------+--------+--------+------------------+-------------------+ CCA Distal59      7                                                     +----------+--------+--------+--------+------------------+-------------------+ ICA Prox  492     143     80-99%  calcific                              +----------+--------+--------+--------+------------------+-------------------+ ICA Mid   50      8                                                     +----------+--------+--------+--------+------------------+-------------------+ ICA Distal                                          Unable to visualize +----------+--------+--------+--------+------------------+-------------------+ ECA       393     13      >50%    calcific                              +----------+--------+--------+--------+------------------+-------------------+ +----------+--------+-------+---------+-------------------+           PSV cm/sEDV cmsDescribe Arm Pressure  (mmHG) +----------+--------+-------+---------+-------------------+ Subclavian220            Turbulent                    +----------+--------+-------+---------+-------------------+ +---------+--------+--+--------+-+---------+ VertebralPSV cm/s70EDV cm/s9Antegrade +---------+--------+--+--------+-+---------+  Left Carotid Findings: +----------+--------+--------+--------+------------------+--------+           PSV cm/sEDV cm/sStenosisPlaque DescriptionComments +----------+--------+--------+--------+------------------+--------+ CCA Prox  121     15                                         +----------+--------+--------+--------+------------------+--------+ CCA Distal156     15  calcific                   +----------+--------+--------+--------+------------------+--------+ ICA Prox  77      15      1-39%                              +----------+--------+--------+--------+------------------+--------+ ICA Mid   90      19                                         +----------+--------+--------+--------+------------------+--------+ ICA Distal87      17                                         +----------+--------+--------+--------+------------------+--------+ ECA       158     18                                         +----------+--------+--------+--------+------------------+--------+ +----------+--------+--------+----------------+-------------------+           PSV cm/sEDV cm/sDescribe        Arm Pressure (mmHG) +----------+--------+--------+----------------+-------------------+ [id number]             Multiphasic, WNL                    +----------+--------+--------+----------------+-------------------+ +---------+--------+--+--------+--+---------+ VertebralPSV cm/s80EDV cm/s17Antegrade +---------+--------+--+--------+--+---------+   Summary: Right Carotid: Velocities in the right ICA are consistent with a 80-99%                 stenosis. The ECA appears >50% stenosed. Incidental: Large                thyroid nodule with internal calcifications noted. Left Carotid: Velocities in the left ICA are consistent with a 1-39% stenosis.               Incidental: Multiple thyroid nodules, some with internal               calcifications noted. Vertebrals:  Bilateral vertebral arteries demonstrate antegrade flow. Subclavians: Right subclavian artery flow was disturbed. Normal flow              hemodynamics were seen in the left subclavian artery. *See table(s) above for measurements and observations.  Electronically signed by Antony Contras MD on 12/27/2021 at 12:37:34 PM.    Final    ECHOCARDIOGRAM COMPLETE  Result Date: 12/27/2021    ECHOCARDIOGRAM REPORT   Patient Name:   Glora MARIE Worth Date of Exam: 12/27/2021 Medical Rec #:  [id number]           Height:       61.0 in Accession #:    [id number]          Weight:       147.7 lb Date of Birth:  08/07/1940            BSA:          1.660 m Patient Age:    35 years            BP:           158/57 mmHg Patient Gender: F  HR:           123 bpm. Exam Location:  Inpatient Procedure: 2D Echo, Cardiac Doppler and Color Doppler Indications:    Stroke I63.9  History:        Patient has prior history of Echocardiogram examinations, most                 recent 07/25/2021. Carotid Disease, Signs/Symptoms:Altered mental                 status unable to follow commands.; Risk Factors:Hypertension,                 Diabetes and Dyslipidemia.  Sonographer:    Darlina Sicilian RDCS Referring Phys: [id number] [place]  1. Peak intra-cavitary gradient 28 mmHg at rest due to hyperdynamic LV function. No mitral valve SAM or outflow tract obstruction.     Left ventricular ejection fraction, by estimation, is >75%. The left ventricle has hyperdynamic function. The left ventricle has no regional wall motion abnormalities. There is mild asymmetric left ventricular hypertrophy of the basal-septal  segment.  Left ventricular diastolic parameters are consistent with Grade I diastolic dysfunction (impaired relaxation).  2. Right ventricular systolic function is hyperdynamic. The right ventricular size is normal. Tricuspid regurgitation signal is inadequate for assessing PA pressure.  3. The mitral valve is grossly normal. No evidence of mitral valve regurgitation. No evidence of mitral stenosis.  4. The aortic valve is tricuspid. Aortic valve regurgitation is not visualized. No aortic stenosis is present.  5. The inferior vena cava is normal in size with greater than 50% respiratory variability, suggesting right atrial pressure of 3 mmHg. Comparison(s): No significant change from prior study. FINDINGS  Left Ventricle: Peak intra-cavitary gradient 28 mmHg at rest due to hyperdynamic LV function. No mitral valve SAM or outflow tract obstruction. Left ventricular ejection fraction, by estimation, is >75%. The left ventricle has hyperdynamic function. The  left ventricle has no regional wall motion abnormalities. The left ventricular internal cavity size was small. There is mild asymmetric left ventricular hypertrophy of the basal-septal segment. Indeterminate diastolic filling due to E-A fusion. Right Ventricle: The right ventricular size is normal. No increase in right ventricular wall thickness. Right ventricular systolic function is hyperdynamic. Tricuspid regurgitation signal is inadequate for assessing PA pressure. Left Atrium: Left atrial size was normal in size. Right Atrium: Right atrial size was normal in size. Pericardium: Trivial pericardial effusion is present. The pericardial effusion is surrounding the apex. Mitral Valve: The mitral valve is grossly normal. Mild mitral annular calcification. No evidence of mitral valve regurgitation. No evidence of mitral valve stenosis. Tricuspid Valve: The tricuspid valve is grossly normal. Tricuspid valve regurgitation is trivial. No evidence of tricuspid stenosis.  Aortic Valve: The aortic valve is tricuspid. Aortic valve regurgitation is not visualized. No aortic stenosis is present. Pulmonic Valve: The pulmonic valve was grossly normal. Pulmonic valve regurgitation is not visualized. No evidence of pulmonic stenosis. Aorta: The aortic root is normal in size and structure. Venous: The inferior vena cava is normal in size with greater than 50% respiratory variability, suggesting right atrial pressure of 3 mmHg. IAS/Shunts: The atrial septum is grossly normal.  LEFT VENTRICLE PLAX 2D LVIDd:         4.00 cm     Diastology LVIDs:         2.10 cm     LV e' medial:    3.59 cm/s LV PW:  0.90 cm     LV E/e' medial:  20.9 LV IVS:        1.20 cm     LV e' lateral:   6.74 cm/s LVOT diam:     1.90 cm     LV E/e' lateral: 11.1 LV SV:         56 LV SV Index:   34 LVOT Area:     2.84 cm  LV Volumes (MOD) LV vol d, MOD A4C: 58.1 ml LV vol s, MOD A4C: 10.3 ml LV SV MOD A4C:     58.1 ml RIGHT VENTRICLE RV S prime:     23.90 cm/s TAPSE (M-mode): 1.9 cm LEFT ATRIUM             Index        RIGHT ATRIUM          Index LA diam:        3.30 cm 1.99 cm/m   RA Area:     8.57 cm LA Vol (A2C):   25.4 ml 15.30 ml/m  RA Volume:   13.70 ml 8.25 ml/m LA Vol (A4C):   28.6 ml 17.22 ml/m LA Biplane Vol: 27.2 ml 16.38 ml/m  AORTIC VALVE LVOT Vmax:   130.00 cm/s LVOT Vmean:  85.400 cm/s LVOT VTI:    0.197 m  AORTA Ao Root diam: 2.90 cm MITRAL VALVE MV Area (PHT): 9.48 cm     SHUNTS MV Decel Time: 80 msec      Systemic VTI:  0.20 m MV E velocity: 75.10 cm/s   Systemic Diam: 1.90 cm MV A velocity: 139.00 cm/s MV E/A ratio:  0.54 Eleonore Chiquito MD Electronically signed by Eleonore Chiquito MD Signature Date/Time: 12/27/2021/12:28:50 PM    Final    MR BRAIN WO CONTRAST  Result Date: 12/26/2021 CLINICAL DATA:  Stroke suspected EXAM: MRI HEAD WITHOUT CONTRAST MRA HEAD WITHOUT CONTRAST MRA NECK WITHOUT CONTRAST TECHNIQUE: Multiplanar, multi-echo pulse sequences of the brain and surrounding structures  were acquired without intravenous contrast. Angiographic images of the Circle of Willis were acquired using MRA technique without intravenous contrast. Angiographic images of the neck were acquired using MRA technique without intravenous contrast. Carotid stenosis measurements (when applicable) are obtained utilizing NASCET criteria, using the distal internal carotid diameter as the denominator. COMPARISON:  07/25/2021 MRI head, no prior MRA, correlation is made with CT head 07/26/2021 and CTA head and neck 07/24/2021 FINDINGS: MRI HEAD FINDINGS Evaluation is somewhat limited by motion artifact. Brain: Restricted diffusion with ADC correlate in the right MCA territory, the largest area of which involves the posterior right frontal lobe (series 2, image 33), but with additional involvement of the right insula (series 2, image 28), more anterior right frontal lobe (series 2, image 36), and right parietal lobe (series 3, image 12). Many of these areas are adjacent to infarcts noted on 07/25/2021. No acute hemorrhage, mass, mass effect, or midline shift. Encephalomalacia in the right parietal lobe and watershed territory, likely sequela of the 07/25/2021 infarcts. No hydrocephalus or extra-axial collection. Unchanged size and configuration of the ventricles, with ex vacuo dilatation reflecting central volume loss. Confluent T2 hyperintense signal in the periventricular white matter, likely the sequela of severe chronic small vessel ischemic disease. Vascular: Please see MRA findings below. Skull and upper cervical spine: Normal marrow signal. Sinuses/Orbits: Clear paranasal sinuses. Status post bilateral lens replacements. Other: The mastoids are well aerated. MRA HEAD FINDINGS Evaluation is limited by motion artifact. Anterior circulation: Grossly normal signal in the  right ICA and proximal MCA. Poor visualization of the left ACA. Relatively poor signal in the right ICA, with no signal seen past the terminus (series 2,  image 60). No definite flow signal is seen in right MCA. Possible signal in the right A1. Posterior circulation: The proximal vertebral arteries are not evaluated. The distal vertebral arteries are patent to the vertebrobasilar junction. The basilar artery is patent to the terminus. The SCA is are not well seen. Grossly normal flow in the bilateral PCAs. MRA NECK FINDINGS Evaluation is limited by motion in the absence of intravenous contrast. Aortic arch: Three-vessel arch. No evidence of aneurysm or dissection in the imaged aorta. Right carotid system: Severe stenosis at the origin of the ICA, with poor flow signal seen in the right compared to the left. On the 2D acquisition, flow signal is seen to the supraclinoid segment, but on the 3D acquisition, definitive signal is not seen past the mid right ICA. Left carotid system: Grossly patent. Vertebral arteries: Left dominant system, with relatively diminutive right vertebral artery. Both vertebral arteries appear patent to the V3 segment. Other: None IMPRESSION: 1. Evaluation is somewhat limited by motion artifact. Within this limitation, there are acute infarcts in the right MCA territory, with the largest area of infarction in the posterior right frontal lobe, but with additional involvement of the right insula, anterior right frontal lobe, and right parietal lobe. 2. Severe stenosis at the origin of the right ICA, with poor flow signal in the mid and distal right extracranial ICA, relatively diminished signal in the right intracranial ICA, and no signal seen in the right MCA. Minimal signal in the right A1. This is poorly evaluated given the degree of motion and the degree of stenosis and could be better evaluated with a CTA head and neck. These results were called by telephone at the time of interpretation on 12/26/2021 at 7:36 pm to provider [facility] , who verbally acknowledged these results. Electronically Signed   By: Merilyn Baba M.D.   On: 12/26/2021  19:40   MR ANGIO HEAD WO CONTRAST  Result Date: 12/26/2021 CLINICAL DATA:  Stroke suspected EXAM: MRI HEAD WITHOUT CONTRAST MRA HEAD WITHOUT CONTRAST MRA NECK WITHOUT CONTRAST TECHNIQUE: Multiplanar, multi-echo pulse sequences of the brain and surrounding structures were acquired without intravenous contrast. Angiographic images of the Circle of Willis were acquired using MRA technique without intravenous contrast. Angiographic images of the neck were acquired using MRA technique without intravenous contrast. Carotid stenosis measurements (when applicable) are obtained utilizing NASCET criteria, using the distal internal carotid diameter as the denominator. COMPARISON:  07/25/2021 MRI head, no prior MRA, correlation is made with CT head 07/26/2021 and CTA head and neck 07/24/2021 FINDINGS: MRI HEAD FINDINGS Evaluation is somewhat limited by motion artifact. Brain: Restricted diffusion with ADC correlate in the right MCA territory, the largest area of which involves the posterior right frontal lobe (series 2, image 33), but with additional involvement of the right insula (series 2, image 28), more anterior right frontal lobe (series 2, image 36), and right parietal lobe (series 3, image 12). Many of these areas are adjacent to infarcts noted on 07/25/2021. No acute hemorrhage, mass, mass effect, or midline shift. Encephalomalacia in the right parietal lobe and watershed territory, likely sequela of the 07/25/2021 infarcts. No hydrocephalus or extra-axial collection. Unchanged size and configuration of the ventricles, with ex vacuo dilatation reflecting central volume loss. Confluent T2 hyperintense signal in the periventricular white matter, likely the sequela of severe chronic small  vessel ischemic disease. Vascular: Please see MRA findings below. Skull and upper cervical spine: Normal marrow signal. Sinuses/Orbits: Clear paranasal sinuses. Status post bilateral lens replacements. Other: The mastoids are well  aerated. MRA HEAD FINDINGS Evaluation is limited by motion artifact. Anterior circulation: Grossly normal signal in the right ICA and proximal MCA. Poor visualization of the left ACA. Relatively poor signal in the right ICA, with no signal seen past the terminus (series 2, image 60). No definite flow signal is seen in right MCA. Possible signal in the right A1. Posterior circulation: The proximal vertebral arteries are not evaluated. The distal vertebral arteries are patent to the vertebrobasilar junction. The basilar artery is patent to the terminus. The SCA is are not well seen. Grossly normal flow in the bilateral PCAs. MRA NECK FINDINGS Evaluation is limited by motion in the absence of intravenous contrast. Aortic arch: Three-vessel arch. No evidence of aneurysm or dissection in the imaged aorta. Right carotid system: Severe stenosis at the origin of the ICA, with poor flow signal seen in the right compared to the left. On the 2D acquisition, flow signal is seen to the supraclinoid segment, but on the 3D acquisition, definitive signal is not seen past the mid right ICA. Left carotid system: Grossly patent. Vertebral arteries: Left dominant system, with relatively diminutive right vertebral artery. Both vertebral arteries appear patent to the V3 segment. Other: None IMPRESSION: 1. Evaluation is somewhat limited by motion artifact. Within this limitation, there are acute infarcts in the right MCA territory, with the largest area of infarction in the posterior right frontal lobe, but with additional involvement of the right insula, anterior right frontal lobe, and right parietal lobe. 2. Severe stenosis at the origin of the right ICA, with poor flow signal in the mid and distal right extracranial ICA, relatively diminished signal in the right intracranial ICA, and no signal seen in the right MCA. Minimal signal in the right A1. This is poorly evaluated given the degree of motion and the degree of stenosis and could  be better evaluated with a CTA head and neck. These results were called by telephone at the time of interpretation on 12/26/2021 at 7:36 pm to provider [facility] , who verbally acknowledged these results. Electronically Signed   By: Merilyn Baba M.D.   On: 12/26/2021 19:40   MR ANGIO NECK WO CONTRAST  Result Date: 12/26/2021 CLINICAL DATA:  Stroke suspected EXAM: MRI HEAD WITHOUT CONTRAST MRA HEAD WITHOUT CONTRAST MRA NECK WITHOUT CONTRAST TECHNIQUE: Multiplanar, multi-echo pulse sequences of the brain and surrounding structures were acquired without intravenous contrast. Angiographic images of the Circle of Willis were acquired using MRA technique without intravenous contrast. Angiographic images of the neck were acquired using MRA technique without intravenous contrast. Carotid stenosis measurements (when applicable) are obtained utilizing NASCET criteria, using the distal internal carotid diameter as the denominator. COMPARISON:  07/25/2021 MRI head, no prior MRA, correlation is made with CT head 07/26/2021 and CTA head and neck 07/24/2021 FINDINGS: MRI HEAD FINDINGS Evaluation is somewhat limited by motion artifact. Brain: Restricted diffusion with ADC correlate in the right MCA territory, the largest area of which involves the posterior right frontal lobe (series 2, image 33), but with additional involvement of the right insula (series 2, image 28), more anterior right frontal lobe (series 2, image 36), and right parietal lobe (series 3, image 12). Many of these areas are adjacent to infarcts noted on 07/25/2021. No acute hemorrhage, mass, mass effect, or midline shift. Encephalomalacia in the  right parietal lobe and watershed territory, likely sequela of the 07/25/2021 infarcts. No hydrocephalus or extra-axial collection. Unchanged size and configuration of the ventricles, with ex vacuo dilatation reflecting central volume loss. Confluent T2 hyperintense signal in the periventricular white matter,  likely the sequela of severe chronic small vessel ischemic disease. Vascular: Please see MRA findings below. Skull and upper cervical spine: Normal marrow signal. Sinuses/Orbits: Clear paranasal sinuses. Status post bilateral lens replacements. Other: The mastoids are well aerated. MRA HEAD FINDINGS Evaluation is limited by motion artifact. Anterior circulation: Grossly normal signal in the right ICA and proximal MCA. Poor visualization of the left ACA. Relatively poor signal in the right ICA, with no signal seen past the terminus (series 2, image 60). No definite flow signal is seen in right MCA. Possible signal in the right A1. Posterior circulation: The proximal vertebral arteries are not evaluated. The distal vertebral arteries are patent to the vertebrobasilar junction. The basilar artery is patent to the terminus. The SCA is are not well seen. Grossly normal flow in the bilateral PCAs. MRA NECK FINDINGS Evaluation is limited by motion in the absence of intravenous contrast. Aortic arch: Three-vessel arch. No evidence of aneurysm or dissection in the imaged aorta. Right carotid system: Severe stenosis at the origin of the ICA, with poor flow signal seen in the right compared to the left. On the 2D acquisition, flow signal is seen to the supraclinoid segment, but on the 3D acquisition, definitive signal is not seen past the mid right ICA. Left carotid system: Grossly patent. Vertebral arteries: Left dominant system, with relatively diminutive right vertebral artery. Both vertebral arteries appear patent to the V3 segment. Other: None IMPRESSION: 1. Evaluation is somewhat limited by motion artifact. Within this limitation, there are acute infarcts in the right MCA territory, with the largest area of infarction in the posterior right frontal lobe, but with additional involvement of the right insula, anterior right frontal lobe, and right parietal lobe. 2. Severe stenosis at the origin of the right ICA, with poor  flow signal in the mid and distal right extracranial ICA, relatively diminished signal in the right intracranial ICA, and no signal seen in the right MCA. Minimal signal in the right A1. This is poorly evaluated given the degree of motion and the degree of stenosis and could be better evaluated with a CTA head and neck. These results were called by telephone at the time of interpretation on 12/26/2021 at 7:36 pm to provider [facility] , who verbally acknowledged these results. Electronically Signed   By: Merilyn Baba M.D.   On: 12/26/2021 19:40   DG Chest 1 View  Result Date: 12/26/2021 CLINICAL DATA:  [id number] Stroke (cerebrum) [facility]) [id number] EXAM: CHEST  1 VIEW COMPARISON:  September 19, 2018 FINDINGS: The cardiomediastinal silhouette is unchanged in contour.Atherosclerotic calcifications of the aorta. No pleural effusion. No pneumothorax. Mildly increased bronchitic markings bilaterally. No focal consolidation. Visualized abdomen is unremarkable. IMPRESSION: Mildly increased bronchitic markings bilaterally. This is nonspecific and could reflect bronchitis or mild underlying pulmonary edema. Electronically Signed   By: Valentino Saxon M.D.   On: 12/26/2021 19:37   CT HEAD WO CONTRAST  Result Date: 12/26/2021 CLINICAL DATA:  Neuro deficit, acute, stroke suspected EXAM: CT HEAD WITHOUT CONTRAST TECHNIQUE: Contiguous axial images were obtained from the base of the skull through the vertex without intravenous contrast. RADIATION DOSE REDUCTION: This exam was performed according to the departmental dose-optimization program which includes automated exposure control, adjustment of the mA and/or kV  according to patient size and/or use of iterative reconstruction technique. COMPARISON:  CT November 30, 2021. FINDINGS: Brain: New/interval infarct in the posterior right frontal lobe, potentially acute. No evidence of acute hemorrhage, hydrocephalus, extra-axial collection or mass lesion/mass effect. Additional  remote right frontal and parietal infarcts. Additional patchy white matter hypodensities, nonspecific but compatible with chronic microvascular ischemic disease. Vascular: No hyperdense vessel.  Calcific atherosclerosis. Skull: No acute fracture. Sinuses/Orbits: No acute finding. Other: No mastoid effusions. IMPRESSION: New/interval infarct in the posterior right frontal lobe, potentially acute. Recommend MRI for further evaluation. Electronically Signed   By: Margaretha Sheffield M.D.   On: 12/26/2021 15:01        Scheduled Meds:  ezetimibe  10 mg Oral Daily   fentaNYL       insulin aspart  0-15 Units Subcutaneous TID WC   living well with diabetes book   Does not apply Once   venlafaxine XR  75 mg Oral Q breakfast   Continuous Infusions:  sodium chloride 100 mL/hr at 12/28/21 0324   heparin 800 Units/hr (12/28/21 0432)     LOS: 2 days    Time spent: 48 minutes spent on chart review, discussion with nursing staff, consultants, updating family and interview/physical exam; more than 50% of that time was spent in counseling and/or coordination of care.    Rosealie Reach J [place] DO [facility] Available via Epic secure chat 7am-7pm After these hours, please refer to coverage provider listed on [email or web address] 12/28/2021, 12:08 PM

## 2021-12-28 NOTE — Evaluation (Signed)
Physical Therapy Evaluation Patient Details Name: Wanda [name] MRN: [id number] DOB: 1940-07-25 Today's Date: 12/28/2021  History of Present Illness  81 y.o. female who presented for evaluation of worsening left-sided weakness. MRI brain: acute infarcts in the right MCA territory, with the largest area of infarction in the posterior right frontal lobe, but with additional involvement of the right insula, anterior right frontal lobe, and right parietal lobe. PMH: recent diagnosed DVT on Eliquis, recent stroke in July 2023 on Plavix, HTN, HLD.  Clinical Impression  Pt is presenting with significant weakness in the LUE with no movement noted and with very minimal movement in the LLE though movement was noted in the L quad and anterior tibialis with knee ext and DF within very limited ROM. Pt is able to follow directions and assist with mobility decreasing need for physical assistance but still requires heavy physical assistance for bed mobility and due to poor kinesthetic postural awareness unsafe to attempt to stand without a second person or a lift. Pt was able to progress to sitting EOB at SBA with multi modal cueing. Pt will benefit from continued skilled physical therapy services in SNF setting on discharge from acute care hospital setting in order to increase strength, balance and decrease need for physical assistance. No acute respiratory/cardiac signs/symptoms throughout session today.        Recommendations for follow up therapy are one component of a multi-disciplinary discharge planning process, led by the attending physician.  Recommendations may be updated based on patient status, additional functional criteria and insurance authorization.  Follow Up Recommendations Skilled nursing-short term rehab (<3 hours/day) Can patient physically be transported by private vehicle: No    Assistance Recommended at Discharge Frequent or constant Supervision/Assistance  Patient can return home with  the following  Two people to help with walking and/or transfers;Assist for transportation;Help with stairs or ramp for entrance;Assistance with cooking/housework    Equipment Recommendations Other (comment) (defer to post acute)  Recommendations for Other Services       Functional Status Assessment Patient has had a recent decline in their functional status and demonstrates the ability to make significant improvements in function in a reasonable and predictable amount of time.     Precautions / Restrictions Precautions Precautions: Fall Restrictions Weight Bearing Restrictions: No      Mobility  Bed Mobility Overal bed mobility: Needs Assistance Bed Mobility: Rolling, Supine to Sit, Sit to Supine Rolling: Mod assist   Supine to sit: Mod assist Sit to supine: Max assist   General bed mobility comments: pt is Mod A for scooting up in the bed with tilt and verbal/tactile cues for hand/LE placement Patient Response: Cooperative  Transfers       General transfer comment: Due to current postural control unsafe to attempt standing      Modified Rankin (Stroke Patients Only) Modified Rankin (Stroke Patients Only) Pre-Morbid Rankin Score: Moderate disability Modified Rankin: Severe disability     Balance Overall balance assessment: Needs assistance Sitting-balance support: Feet supported, Single extremity supported Sitting balance-Leahy Scale: Fair Sitting balance - Comments: L lateral lean that improved significantly with multi modal cueing and R hand placement. Postural control: Left lateral lean, Posterior lean                                   Pertinent Vitals/Pain Pain Assessment Pain Assessment: No/denies pain    Home Living Family/patient expects to be discharged to::  Skilled nursing facility     Type of Home: Apartment             Additional Comments: pt unreliable historian, information from chart and RN staff. [facility]    Prior Function Prior Level of Function : Needs assist  Cognitive Assist : Mobility (cognitive);ADLs (cognitive)     Physical Assist : Mobility (physical);ADLs (physical) Mobility (physical): Transfers ADLs (physical): Bathing;Dressing;Toileting;IADLs Mobility Comments: primarily uses wc ADLs Comments: staff assists as needed     Hand Dominance   Dominant Hand: Right    Extremity/Trunk Assessment   Upper Extremity Assessment Upper Extremity Assessment: Defer to OT evaluation RUE Deficits / Details: generalized weakness LUE Deficits / Details: flaccid    Lower Extremity Assessment Lower Extremity Assessment: Generalized weakness;LLE deficits/detail LLE Deficits / Details: 2-/5 L quad and anterior tib contraction    Cervical / Trunk Assessment Cervical / Trunk Assessment: Other exceptions;Kyphotic (L lean and forward head posture)  Communication   Communication: No difficulties  Cognition Arousal/Alertness: Awake/alert Behavior During Therapy: Impulsive Overall Cognitive Status: Impaired/Different from baseline Area of Impairment: Orientation, Attention, Memory, Following commands, Safety/judgement, Awareness, Problem solving                 Orientation Level: Disoriented to, Time       Safety/Judgement: Decreased awareness of safety, Decreased awareness of deficits   Problem Solving: Slow processing, Decreased initiation, Difficulty sequencing, Requires verbal cues, Requires tactile cues          General Comments General comments (skin integrity, edema, etc.): open skin on buttocks - nsg made aware    Exercises     Assessment/Plan    PT Assessment Patient needs continued PT services  PT Problem List Decreased strength;Decreased mobility;Decreased activity tolerance;Decreased balance       PT Treatment Interventions Therapeutic exercise;DME instruction;Gait training;Balance training;Manual techniques;Neuromuscular  re-education;Functional mobility training;Therapeutic activities;Patient/family education    PT Goals (Current goals can be found in the Care Plan section)  Acute Rehab PT Goals Patient Stated Goal: None stated Time For Goal Achievement: 01/11/22 Potential to Achieve Goals: Fair    Frequency Min 3X/week        AM-PAC PT "6 Clicks" Mobility  Outcome Measure Help needed turning from your back to your side while in a flat bed without using bedrails?: A Lot Help needed moving from lying on your back to sitting on the side of a flat bed without using bedrails?: A Lot Help needed moving to and from a bed to a chair (including a wheelchair)?: Total Help needed standing up from a chair using your arms (e.g., wheelchair or bedside chair)?: Total Help needed to walk in hospital room?: Total Help needed climbing 3-5 steps with a railing? : Total 6 Click Score: 8    End of Session Equipment Utilized During Treatment: Gait belt Activity Tolerance: Patient tolerated treatment well Patient left: in bed;with bed alarm set;with call bell/phone within reach Nurse Communication: Mobility status PT Visit Diagnosis: Other abnormalities of gait and mobility (R26.89);Muscle weakness (generalized) (M62.81)    Time: 4098-1191 PT Time Calculation (min) (ACUTE ONLY): 18 min   Charges:   PT Evaluation $PT Eval Moderate Complexity: [place], DPT, CLT  Acute Rehabilitation Services Office: [phone] (Secure chat preferred)   Ander Purpura 12/28/2021, 4:35 PM

## 2021-12-28 NOTE — Evaluation (Signed)
Occupational Therapy Evaluation Patient Details Name: Wanda [name] MRN: [id number] DOB: 1940-04-27 Today's Date: 12/28/2021   History of Present Illness 81 y.o. female who presented for evaluation of worsening left-sided weakness. MRI brain: acute infarcts in the right MCA territory, with the largest area of infarction in the posterior right frontal lobe, but with additional involvement of the right insula, anterior right frontal lobe, and right parietal lobe. PMH: recent diagnosed DVT on Eliquis, recent stroke in July 2023 on Plavix, HTN, HLD.   Clinical Impression   Pt initially stating she lives alone and is independent with ADL and mobility, however her daughter entered the room and stated her Mom lives at [facility] and has assistance for mobility and ADL tasks at wc level since her last stroke.  Able to progress to EOB with mod A however impulsively returned to sidelying and vomited a large amount and was also incontinent of BM. Pt currently Max to total A with ADL tasks due to below listed deficits. Pt will benefit from rehab at SNF to maximize functional level of independence.      Recommendations for follow up therapy are one component of a multi-disciplinary discharge planning process, led by the attending physician.  Recommendations may be updated based on patient status, additional functional criteria and insurance authorization.   Follow Up Recommendations  Skilled nursing-short term rehab (<3 hours/day)     Assistance Recommended at Discharge Frequent or constant Supervision/Assistance  Patient can return home with the following Two people to help with walking and/or transfers;Two people to help with bathing/dressing/bathroom;Assistance with feeding;Direct supervision/assist for medications management;Direct supervision/assist for financial management;Assist for transportation;Help with stairs or ramp for entrance    Functional Status Assessment  Patient has had a  recent decline in their functional status and demonstrates the ability to make significant improvements in function in a reasonable and predictable amount of time.  Equipment Recommendations  None recommended by OT    Recommendations for Other Services       Precautions / Restrictions Precautions Precautions: Fall      Mobility Bed Mobility Overal bed mobility: Needs Assistance Bed Mobility: Rolling, Supine to Sit, Sit to Supine Rolling: Mod assist; able to help bridge hips off bed for placement of net underwear   Supine to sit: Mod assist Sit to supine: Max assist        Transfers                   General transfer comment: prior to attempting squat pivot transfer, returned to sidelying impulsively, then vomited on her side      Balance Overall balance assessment: Needs assistance Sitting-balance support: Feet supported Sitting balance-Leahy Scale: Poor Sitting balance - Comments: L lateral lean                                   ADL either performed or assessed with clinical judgement   ADL Overall ADL's : Needs assistance/impaired Eating/Feeding: Moderate assistance   Grooming: Moderate assistance   Upper Body Bathing: Maximal assistance   Lower Body Bathing: Total assistance   Upper Body Dressing : Maximal assistance   Lower Body Dressing: Total assistance       Toileting- Clothing Manipulation and Hygiene: Total assistance       Functional mobility during ADLs: Maximal assistance (will need +2 to transfer)       Vision   Additional Comments: most likely L  field cut     Perception Perception Perception Tested?: Yes Comments: L inattention   Praxis      Pertinent Vitals/Pain Pain Assessment Pain Assessment: No/denies pain     Hand Dominance Right   Extremity/Trunk Assessment Upper Extremity Assessment Upper Extremity Assessment: RUE deficits/detail;LUE deficits/detail RUE Deficits / Details: generalized  weakness LUE Deficits / Details: flaccid   Lower Extremity Assessment Lower Extremity Assessment: Defer to PT evaluation   Cervical / Trunk Assessment Cervical / Trunk Assessment: Other exceptions;Kyphotic (R cervical lateral flexion; limited mobility)   Communication Communication Communication: No difficulties   Cognition Arousal/Alertness: Awake/alert Behavior During Therapy: Impulsive Overall Cognitive Status: Impaired/Different from baseline Area of Impairment: Orientation, Attention, Memory, Following commands, Safety/judgement, Awareness, Problem solving                 Orientation Level: Disoriented to, Time Current Attention Level: Sustained   Following Commands: Follows one step commands with increased time Safety/Judgement: Decreased awareness of safety, Decreased awareness of deficits Awareness: Intellectual Problem Solving: Slow processing, Decreased initiation, Difficulty sequencing, Requires verbal cues, Requires tactile cues       General Comments  open skin on buttocks  - excoriated area- nsg made aware; assisted nsg with cleaning pt and entire linen change; recommended barrier cream and Mepalex pad to nsg for buttocks and sacral area    Exercises     Shoulder Instructions      Home Living Family/patient expects to be discharged to:: Skilled nursing facility     Type of Home: Apartment                                  Prior Functioning/Environment Prior Level of Function : Needs assist  Cognitive Assist : Mobility (cognitive);ADLs (cognitive)     Physical Assist : Mobility (physical);ADLs (physical) Mobility (physical): Transfers ADLs (physical): Bathing;Dressing;Toileting;IADLs Mobility Comments: primarily uses wc ADLs Comments: staff assists as needed        OT Problem List: Decreased strength;Decreased range of motion;Decreased activity tolerance;Impaired balance (sitting and/or standing);Decreased coordination;Decreased  cognition;Decreased safety awareness;Impaired sensation;Impaired tone;Impaired UE functional use;Pain      OT Treatment/Interventions: Self-care/ADL training;Therapeutic exercise;Neuromuscular education;DME and/or AE instruction;Therapeutic activities;Cognitive remediation/compensation;Visual/perceptual remediation/compensation;Patient/family education;Balance training    OT Goals(Current goals can be found in the care plan section) Acute Rehab OT Goals Patient Stated Goal: to get better OT Goal Formulation: With patient/family Time For Goal Achievement: 01/11/22 Potential to Achieve Goals: Fair  OT Frequency: Min 2X/week    Co-evaluation              AM-PAC OT "6 Clicks" Daily Activity     Outcome Measure Help from another person eating meals?: A Lot Help from another person taking care of personal grooming?: A Lot Help from another person toileting, which includes using toliet, bedpan, or urinal?: Total Help from another person bathing (including washing, rinsing, drying)?: Total Help from another person to put on and taking off regular upper body clothing?: A Lot Help from another person to put on and taking off regular lower body clothing?: Total 6 Click Score: 9   End of Session Nurse Communication: Mobility status;Other (comment) (skin integrity)  Activity Tolerance: Other (comment) (limited by vomiting) Patient left: in bed;with call bell/phone within reach;with bed alarm set;with family/visitor present (chair position)  OT Visit Diagnosis: Other abnormalities of gait and mobility (R26.89);Muscle weakness (generalized) (M62.81);Low vision, both eyes (H54.2);Other symptoms and signs involving the nervous system (R29.898);Other  symptoms and signs involving cognitive function;Hemiplegia and hemiparesis Hemiplegia - Right/Left: Left Hemiplegia - dominant/non-dominant: Non-Dominant Hemiplegia - caused by: Cerebral infarction                Time: 1416-1500 OT Time Calculation  (min): 44 min Charges:  OT General Charges $OT Visit: 1 Visit OT Evaluation $OT Eval Moderate Complexity: 1 Mod OT Treatments $Self Care/Home Management : 23-37 mins  Maurie Boettcher, OT/L   Acute OT Clinical Specialist Acute Rehabilitation Services Pager [phone] Office [phone]   [facility] 12/28/2021, 3:30 PM

## 2021-12-28 NOTE — Evaluation (Signed)
Clinical/Bedside Swallow Evaluation Patient Details  Name: Chelan Heringer MRN: [id number] Date of Birth: 1940-01-13  Today's Date: 12/28/2021 Time: SLP Start Time (ACUTE ONLY): 65 SLP Stop Time (ACUTE ONLY): 3295 SLP Time Calculation (min) (ACUTE ONLY): 14 min  Past Medical History:  Past Medical History:  Diagnosis Date   Arthritis    Carotid stenosis 11/2018   Cataract    surgery to remove   Diabetes mellitus    type 2   Hyperlipidemia    Hypertension    Skin cancer    Removed from face   Stroke ([place]) 09/19/2018   Urgency of urination    Urinary leakage    Past Surgical History:  Past Surgical History:  Procedure Laterality Date   ABDOMINAL HYSTERECTOMY  early 80's   total   CAROTID ENDARTERECTOMY Left 11/13/2018   CARPAL TUNNEL RELEASE Bilateral 1980 and 1981   both hands   ENDARTERECTOMY Left 11/13/2018   Procedure: Left Carotid Artery Endarterectomy with Patch Angioplasty;  Surgeon: Rosetta Posner, MD;  Location: [facility] OR;  Service: Vascular;  Laterality: Left;   EYE SURGERY     bilateral cataracts   Fatty Tumor Excision     JOINT REPLACEMENT     TONSILLECTOMY     TONSILLECTOMY AND ADENOIDECTOMY  age 77   TOTAL KNEE ARTHROPLASTY Right 10/04/2015   TOTAL KNEE ARTHROPLASTY Right 10/04/2015   Procedure: TOTAL KNEE ARTHROPLASTY;  Surgeon: Dorna Leitz, MD;  Location: [facility];  Service: Orthopedics;  Laterality: Right;   TOTAL KNEE ARTHROPLASTY Left 01/28/2016   Procedure: TOTAL KNEE ARTHROPLASTY;  Surgeon: Dorna Leitz, MD;  Location: [facility];  Service: Orthopedics;  Laterality: Left;   TUBAL LIGATION     HPI:  Pt is an 81 y.o. female who presented for evaluation of worsening left-sided weakness. MRI brain: acute infarcts in the right MCA territory, with the largest area of infarction in the posterior right frontal lobe, but with additional involvement of the right insula, anterior right frontal lobe, and right parietal lobe. PMH: recent diagnosed DVT on Eliquis, recent  stroke in July 2023 on Plavix, HTN, HLD.    Assessment / Plan / Recommendation  Clinical Impression  Pt was seen for bedside swallow evaluation and she denied a history of dysphagia. Oral mechanism exam was significant for left-sided facial weakness and reduced lingual ROM and strength. Dentition was natural and adequate. She presented with symptoms of oral phase dysphagia characterized by reduced labial seal and anterior spillage. Mastication was mildly prolonged, but functional and oral clearance was adequate. A regular texture diet with thin liquids is recommended at this time and SLP will follow to ensure tolerance. SLP Visit Diagnosis: Dysphagia, unspecified (R13.10)    Aspiration Risk  No limitations    Diet Recommendation Regular;Thin liquid   Liquid Administration via: Cup;Straw Medication Administration: Whole meds with puree Compensations: Slow rate;Small sips/bites Postural Changes: Seated upright at 90 degrees    Other  Recommendations Oral Care Recommendations: Oral care BID    Recommendations for follow up therapy are one component of a multi-disciplinary discharge planning process, led by the attending physician.  Recommendations may be updated based on patient status, additional functional criteria and insurance authorization.  Follow up Recommendations  (Continued skilled SLP services at level of care recommended by PT/OT)      Assistance Recommended at Discharge    Functional Status Assessment Patient has had a recent decline in their functional status and demonstrates the ability to make significant improvements in function in a  reasonable and predictable amount of time.  Frequency and Duration min 2x/week  2 weeks       Prognosis Prognosis for Safe Diet Advancement: Good      Swallow Study   General Date of Onset: 12/27/21 HPI: Pt is an 81 y.o. female who presented for evaluation of worsening left-sided weakness. MRI brain: acute infarcts in the right MCA  territory, with the largest area of infarction in the posterior right frontal lobe, but with additional involvement of the right insula, anterior right frontal lobe, and right parietal lobe. PMH: recent diagnosed DVT on Eliquis, recent stroke in July 2023 on Plavix, HTN, HLD. Type of Study: Bedside Swallow Evaluation Previous Swallow Assessment: none Diet Prior to this Study: NPO Temperature Spikes Noted: No Respiratory Status: Room air History of Recent Intubation: No Behavior/Cognition: Alert;Cooperative;Pleasant mood Oral Cavity Assessment: Within Functional Limits Oral Care Completed by SLP: No Oral Cavity - Dentition: Adequate natural dentition Vision: Functional for self-feeding Self-Feeding Abilities: Needs assist Patient Positioning: Upright in bed;Postural control adequate for testing Baseline Vocal Quality: Low vocal intensity Volitional Cough: Weak Volitional Swallow: Able to elicit    Oral/Motor/Sensory Function Overall Oral Motor/Sensory Function: Moderate impairment Facial ROM: Reduced left;Suspected CN VII (facial) dysfunction Facial Symmetry: Abnormal symmetry left;Suspected CN VII (facial) dysfunction Facial Strength: Reduced left;Suspected CN VII (facial) dysfunction Facial Sensation: Within Functional Limits Lingual ROM: Reduced right;Reduced left;Suspected CN XII (hypoglossal) dysfunction Lingual Strength: Reduced;Suspected CN XII (hypoglossal) dysfunction   Ice Chips Ice chips: Within functional limits Presentation: Spoon   Thin Liquid Thin Liquid: Impaired Presentation: Cup;Straw Oral Phase Impairments: Reduced labial seal Oral Phase Functional Implications: Left anterior spillage    Nectar Thick Nectar Thick Liquid: Not tested   Honey Thick Honey Thick Liquid: Not tested   Puree Puree: Within functional limits Presentation: Spoon   Solid     Solid: Impaired Presentation: Self Fed Oral Phase Impairments: Reduced labial seal Oral Phase Functional  Implications: Left anterior spillage     Rosio Weiss I. Hardin Negus, [place], [place] Office number [phone]  MARLETTA BOUSQUET 12/28/2021,1:50 PM

## 2021-12-29 ENCOUNTER — Inpatient Hospital Stay (HOSPITAL_COMMUNITY): Payer: Medicare Other

## 2021-12-29 DIAGNOSIS — R1111 Vomiting without nausea: Secondary | ICD-10-CM

## 2021-12-29 DIAGNOSIS — I63511 Cerebral infarction due to unspecified occlusion or stenosis of right middle cerebral artery: Secondary | ICD-10-CM | POA: Diagnosis not present

## 2021-12-29 LAB — CBC
HCT: 31 % — ABNORMAL LOW (ref 36.0–46.0)
Hemoglobin: 10.9 g/dL — ABNORMAL LOW (ref 12.0–15.0)
MCH: 31.5 pg (ref 26.0–34.0)
MCHC: 35.2 g/dL (ref 30.0–36.0)
MCV: 89.6 fL (ref 80.0–100.0)
Platelets: 310 10*3/uL (ref 150–400)
RBC: 3.46 MIL/uL — ABNORMAL LOW (ref 3.87–5.11)
RDW: 13.9 % (ref 11.5–15.5)
WBC: 11.5 10*3/uL — ABNORMAL HIGH (ref 4.0–10.5)
nRBC: 0 % (ref 0.0–0.2)

## 2021-12-29 LAB — BASIC METABOLIC PANEL
Anion gap: 9 (ref 5–15)
BUN: 14 mg/dL (ref 8–23)
CO2: 17 mmol/L — ABNORMAL LOW (ref 22–32)
Calcium: 9.2 mg/dL (ref 8.9–10.3)
Chloride: 111 mmol/L (ref 98–111)
Creatinine, Ser: 0.74 mg/dL (ref 0.44–1.00)
GFR, Estimated: >60 mL/min (ref >60)
Glucose, Bld: 265 mg/dL — ABNORMAL HIGH (ref 70–99)
Potassium: 3.3 mmol/L — ABNORMAL LOW (ref 3.5–5.1)
Sodium: 137 mmol/L (ref 135–145)

## 2021-12-29 LAB — GLUCOSE, CAPILLARY
Glucose-Capillary: 250 mg/dL — ABNORMAL HIGH (ref 70–99)
Glucose-Capillary: 312 mg/dL — ABNORMAL HIGH (ref 70–99)
Glucose-Capillary: 173 mg/dL — ABNORMAL HIGH (ref 70–99)
Glucose-Capillary: 122 mg/dL — ABNORMAL HIGH (ref 70–99)

## 2021-12-29 MED ORDER — ONDANSETRON HCL 4 MG/2ML IJ SOLN
4.0000 mg | Freq: Four times a day (QID) | INTRAMUSCULAR | Status: DC | PRN
  Administered 2021-12-29: 4 mg via INTRAVENOUS
  Filled 2021-12-29 (×2): qty 2

## 2021-12-29 MED ORDER — PROCHLORPERAZINE EDISYLATE 10 MG/2ML IJ SOLN
10.0000 mg | Freq: Four times a day (QID) | INTRAMUSCULAR | Status: DC | PRN
  Administered 2021-12-29: 10 mg via INTRAVENOUS
  Filled 2021-12-29: qty 2

## 2021-12-29 MED ORDER — BISACODYL 10 MG RE SUPP
10.0000 mg | Freq: Once | RECTAL | Status: AC
  Administered 2021-12-29: 10 mg via RECTAL
  Filled 2021-12-29: qty 1

## 2021-12-29 MED ORDER — ONDANSETRON HCL 4 MG/2ML IJ SOLN
4.0000 mg | Freq: Once | INTRAMUSCULAR | Status: AC
  Administered 2021-12-29: 4 mg via INTRAVENOUS
  Filled 2021-12-29: qty 2

## 2021-12-29 MED ORDER — INSULIN GLARGINE-YFGN 100 UNIT/ML ~~LOC~~ SOLN
30.0000 [IU] | Freq: Every day | SUBCUTANEOUS | Status: DC
  Administered 2021-12-29 – 2022-01-02 (×5): 30 [IU] via SUBCUTANEOUS
  Filled 2021-12-29 (×6): qty 0.3

## 2021-12-29 MED ORDER — POTASSIUM CHLORIDE CRYS ER 20 MEQ PO TBCR
30.0000 meq | EXTENDED_RELEASE_TABLET | ORAL | Status: AC
  Administered 2021-12-29 (×2): 30 meq via ORAL
  Filled 2021-12-29 (×2): qty 1

## 2021-12-29 NOTE — Progress Notes (Addendum)
Teley reported an event of asystole and upon checking the patient, noted another episode of emesis.  Patient did not remember it happening.  Patient lying left side at 30 degrees at the time.  Gastric content rust in color as the patient had also had a xerelto tablet that is dark red earlier in the shift.  Neurology notified of increased nausea and vomiting x 3 this shift.  Patient is only oriented to person, but is able to be reoriented.  Dr. Leonel Ramsay assessed the patient at the bedside.  Patient states, "this is the most sick I have ever felt in my whole life."

## 2021-12-29 NOTE — Progress Notes (Addendum)
PROGRESS NOTE    [name]  CZY:[id number] DOB: 04/02/1940 DOA: 12/26/2021 PCP: Darreld Mclean, MD    Brief Narrative:   Wanda [name] is a 81 y.o. female with past medical history significant for HTN, HLD, recent DVT on Eliquis, recent CVA July 2023 on Plavix who presented to [facility] ED on 12/18 from [place] [facility] complaining of left arm and leg weakness.  Onset of symptoms Thursday 12/22/21.  On the day of arrival to the ED, patient was also noted to have a left-sided facial droop with slurred speech.  In the ED, temperature 98.2 F, HR 107, RR 17, BP 162/78, SpO2 96% on room air.  Sodium 137, potassium 3.5, chloride 102, CO2 21, glucose 113, BUN 14, creatinine 0.78.  WBC 12.8, hemoglobin 14.7, platelets 274.  INR 1.3.  EtOH level less than 10.  CT head without contrast with new interval infarct posterior right frontal lobe.  MR brain and MRA head/neck with acute infarcts right MCA territory with the largest area of infarction posterior right frontal lobe with additional involvement of the right insula, anterior right frontal lobe and right parietal lobe, severe stenosis origin in the right ICA.  Neurology was consulted.  TRH consulted for admission for further evaluation and management of acute CVA.  Assessment & Plan:   Right MCA CVA, acute Symptomatic severe right ICA stenosis with near occlusion Patient presenting to ED with left-sided weakness, facial droop and slurred speech.  Imaging notable for acute infarcts right MCA territory with the largest area of infarction posterior right frontal lobe with additional involvement of the right insula, anterior right frontal lobe and right parietal lobe, severe stenosis origin in the right ICA.  Neurology was consulted and followed during hospital course.  Carotid Doppler with 1-39% stenosis of left ICA, 80-99 stenosis of right ICA.  TTE with LVEF greater than 75%, mild basal septal LVH, grade 1 diastolic dysfunction, normal left  atrial size, normal atrial septum.  LDL 120.  Hemoglobin A1c 10.4.  Interventional radiology was consulted for potential stenting of right ICA; but family and patient decided not to proceed with angiogram and stenting at this time as they are aware of risks to include decreased quality of life, additional strokes and possibly death.  Neurology now signed off.  Seen by speech therapy and passed swallow evaluation. -- PT/OT: recommends SNF -- heparin drip transitions to Xarelto per daughter's request on 12/20 -- TOC for placement  Nausea/vomiting: RN reports 4 episodes of vomiting overnight.  Currently asymptomatic. -- Check abdominal x-ray -- Zofran/Compazine as needed  Acute metabolic encephalopathy Etiology likely secondary to large CVA, right frontal lobe.  Low suspicion for seizure.  Currently alert and oriented and appears mentation wise likely close to baseline. -- Urinalysis ordered -- Continue supportive care  Essential hypertension Home medications include amlodipine 5 mg p.o. daily, clonidine 0.1 mg p.o. twice daily. -- Continue to hold home and hypertensives, allow for permissive hypertension up to 220/120 with plan of gradual normalization of 5-7 days from initial presentation. -- Continue monitor BP closely, 137/59 this morning  Hyperlipidemia LDL 120.  Intolerant to statins. -- Zetia 10 mg daily  Thyroid nodules Incidental finding on carotid ultrasound.  Hypomagnesemia Hypokalemia Potassium 3.3 this morning, will replete. -- Repeat electrolytes in the a.m.  Type 2 diabetes mellitus, poorly controlled with hyperglycemia Home regimen includes insulin glargine 30 units daily, insulin aspart 3 units 3 times daily AC.  Hemoglobin A1c 10.4, poorly controlled. --Semglee increased to 30 units  subcutaneously daily today --moderate SSI for coverage --CBGs qAC/HS  Recent DVT on anticoagulation On Eliquis outpatient. -- Transitioned heparin drip to Xarelto 12/20 per  daughter's request  Anxiety/depression: On venlafaxine outpatient.   DVT prophylaxis: Heparin drip rivaroxaban (XARELTO) tablet 20 mg    Code Status: DNR Family Communication: No family present in bed side, updated daughter via telephone yesterday afternoon  Disposition Plan:  Level of care: Telemetry Medical Status is: Inpatient Remains inpatient appropriate because: Pending SNF place spent    Consultants:  Neurology Interventional radiology  Procedures:  TTE  Antimicrobials:  None   Subjective: Patient seen examined bedside, resting comfortably.  Lying in bed.  No specific complaints this morning.  RN overnight reports episodes of nausea and vomiting.  No family present at bedside.  Patient and family declined angiogram with ICA stenting yesterday.  Heparin drip now transition to Xarelto.  Awaiting SNF placement. Patient continues with dysarthria, left-sided hemiplegia but following commands appropriately.  Does not at bedside.  No other specific complaints at this time.  Denies headache, no chest pain, no shortness of breath, no abdominal pain.  No other acute events overnight per nursing staff.   Objective: Vitals:   12/29/21 0319 12/29/21 0517 12/29/21 0749 12/29/21 0830  BP: (!) 184/81 (!) 137/59 (!) 155/129 (!) 146/98  Pulse: 99 (!) 106 (!) 106   Resp:   20   Temp:   97.9 F (36.6 C)   TempSrc:   Oral   SpO2:   94%   Weight:      Height:        Intake/Output Summary (Last 24 hours) at 12/29/2021 1032 Last data filed at 12/28/2021 1823 Gross per 24 hour  Intake 4436.41 ml  Output 1200 ml  Net 3236.41 ml   Filed Weights   12/26/21 1800  Weight: 67 kg    Examination:  Physical Exam: GEN: NAD, alert and oriented x 3, chronically ill in appearance with notable dysarthria, left-sided hemiplegia HEENT: NCAT, PERRL, EOMI, sclera clear, MMM PULM: CTAB w/o wheezes/crackles, normal respiratory effort, on room air with SpO2 98% at rest CV: RRR w/o M/G/R GI:  abd soft, NTND, NABS, no R/G/M MSK: no peripheral edema, muscle strength globally intact 5/5 bilateral upper/lower extremities NEURO: Positive dysarthria, left-sided weakness/flaccid upper/lower extremity, moves right side spontaneously PSYCH: Depressed mood, flat affect Integumentary: dry/intact, no rashes or wounds    Data Reviewed: I have personally reviewed following labs and imaging studies  CBC: Recent Labs  Lab 12/26/21 1435 12/26/21 1455 12/27/21 0644 12/28/21 0336 12/29/21 0443  WBC 12.8*  --  14.1* 12.1* 11.5*  NEUTROABS 10.3*  --   --  10.4*  --   HGB 14.7 14.6 12.3 11.6* 10.9*  HCT 46.2* 43.0 35.9* 34.8* 31.0*  MCV 94.7  --  90.7 92.3 89.6  PLT 274  --  291 303 242   Basic Metabolic Panel: Recent Labs  Lab 12/26/21 1435 12/26/21 1455 12/27/21 0645 12/28/21 0336 12/29/21 0447  NA 137 138 139 138 137  K 3.5 4.7 3.4* 3.7 3.3*  CL 102 105 106 109 111  CO2 21*  --  19* 13* 17*  GLUCOSE 113* 110* 172* 248* 265*  BUN '14 20 15 14 14  '$ CREATININE 0.78 0.60 0.78 0.88 0.74  CALCIUM 10.1  --  9.1 9.1 9.2  MG  --   --  1.5* 1.9  --   PHOS  --   --  3.0 2.3*  --    GFR: Estimated  Creatinine Clearance: 48.3 mL/min (by C-G formula based on SCr of 0.74 mg/dL). Liver Function Tests: Recent Labs  Lab 12/26/21 1435 12/27/21 0645 12/28/21 0336  AST 20 16 13*  ALT '13 12 12  '$ ALKPHOS 76 64 62  BILITOT 0.9 1.1 1.2  PROT 7.8 6.4* 6.4*  ALBUMIN 4.0 3.2* 2.9*   No results for input(s): "LIPASE", "AMYLASE" in the last 168 hours. No results for input(s): "AMMONIA" in the last 168 hours. Coagulation Profile: Recent Labs  Lab 12/26/21 1435  INR 1.3*   Cardiac Enzymes: No results for input(s): "CKTOTAL", "CKMB", "CKMBINDEX", "TROPONINI" in the last 168 hours. BNP (last 3 results) No results for input(s): "PROBNP" in the last 8760 hours. HbA1C: Recent Labs    12/27/21 0644  HGBA1C 10.4*   CBG: Recent Labs  Lab 12/28/21 0844 12/28/21 1251 12/28/21 1629  12/28/21 2153 12/29/21 0640  GLUCAP 330* 299* 196* 207* 250*   Lipid Profile: Recent Labs    12/27/21 0645  CHOL 175  HDL 37*  LDLCALC 120*  TRIG 92  CHOLHDL 4.7   Thyroid Function Tests: No results for input(s): "TSH", "T4TOTAL", "FREET4", "T3FREE", "THYROIDAB" in the last 72 hours. Anemia Panel: No results for input(s): "VITAMINB12", "FOLATE", "FERRITIN", "TIBC", "IRON", "RETICCTPCT" in the last 72 hours. Sepsis Labs: No results for input(s): "PROCALCITON", "LATICACIDVEN" in the last 168 hours.  Recent Results (from the past 240 hour(s))  MRSA Next Gen by PCR, Nasal     Status: Abnormal   Collection Time: 12/28/21  7:05 AM   Specimen: Nasal Mucosa; Nasal Swab  Result Value Ref Range Status   MRSA by PCR Next Gen NEGATIVE (A) NOT DETECTED Final    Comment: Performed at [facility], [address] [place],  [address]         Radiology Studies: CT HEAD WO CONTRAST (5MM)  Result Date: 12/29/2021 CLINICAL DATA:  Stroke follow-up EXAM: CT HEAD WITHOUT CONTRAST TECHNIQUE: Contiguous axial images were obtained from the base of the skull through the vertex without intravenous contrast. RADIATION DOSE REDUCTION: This exam was performed according to the departmental dose-optimization program which includes automated exposure control, adjustment of the mA and/or kV according to patient size and/or use of iterative reconstruction technique. COMPARISON:  12/26/2021 CT head and MRI head FINDINGS: Brain: Hypodensity in the right MCA territory, particularly the anterior and posterior right frontal lobe, right insula, and right parietal lobe, which correlates with the acute infarct seen on the 12/26/2021 CT. No evidence of hemorrhagic transformation, mass effect, or midline shift. No evidence of additional acute infarct. No hydrocephalus or extra-axial collection. Vascular: No hyperdense vessel. Atherosclerotic calcifications in the intracranial carotid and vertebral arteries.  Skull: Normal. Negative for fracture or focal lesion. Sinuses/Orbits: Mild mucosal thickening in the left maxillary sinus and ethmoid air cells. Status post bilateral lens replacements. Other: The mastoid air cells are well aerated. IMPRESSION: Evolving right MCA territory infarct, without evidence of hemorrhagic transformation, mass effect, or midline shift. Electronically Signed   By: Merilyn Baba M.D.   On: 12/29/2021 03:20   DG Abd 1 View  Result Date: 12/27/2021 CLINICAL DATA:  NG tube placement EXAM: ABDOMEN - 1 VIEW COMPARISON:  CT 11/30/2021 FINDINGS: Esophageal tube tip overlies the proximal to mid stomach. Nonobstructed gas pattern. IMPRESSION: Esophageal tube tip overlies the proximal to mid stomach. Electronically Signed   By: Donavan Foil M.D.   On: 12/27/2021 20:26   ECHOCARDIOGRAM COMPLETE  Result Date: 12/27/2021    ECHOCARDIOGRAM REPORT  Patient Name:   Chloe MARIE Mcnaught Date of Exam: 12/27/2021 Medical Rec #:  [id number]           Height:       61.0 in Accession #:    [id number]          Weight:       147.7 lb Date of Birth:  12-10-1940            BSA:          1.660 m Patient Age:    5 years            BP:           158/57 mmHg Patient Gender: F                   HR:           123 bpm. Exam Location:  Inpatient Procedure: 2D Echo, Cardiac Doppler and Color Doppler Indications:    Stroke I63.9  History:        Patient has prior history of Echocardiogram examinations, most                 recent 07/25/2021. Carotid Disease, Signs/Symptoms:Altered mental                 status unable to follow commands.; Risk Factors:Hypertension,                 Diabetes and Dyslipidemia.  Sonographer:    Darlina Sicilian RDCS Referring Phys: [id number] [place]  1. Peak intra-cavitary gradient 28 mmHg at rest due to hyperdynamic LV function. No mitral valve SAM or outflow tract obstruction.     Left ventricular ejection fraction, by estimation, is >75%. The left ventricle has hyperdynamic  function. The left ventricle has no regional wall motion abnormalities. There is mild asymmetric left ventricular hypertrophy of the basal-septal segment.  Left ventricular diastolic parameters are consistent with Grade I diastolic dysfunction (impaired relaxation).  2. Right ventricular systolic function is hyperdynamic. The right ventricular size is normal. Tricuspid regurgitation signal is inadequate for assessing PA pressure.  3. The mitral valve is grossly normal. No evidence of mitral valve regurgitation. No evidence of mitral stenosis.  4. The aortic valve is tricuspid. Aortic valve regurgitation is not visualized. No aortic stenosis is present.  5. The inferior vena cava is normal in size with greater than 50% respiratory variability, suggesting right atrial pressure of 3 mmHg. Comparison(s): No significant change from prior study. FINDINGS  Left Ventricle: Peak intra-cavitary gradient 28 mmHg at rest due to hyperdynamic LV function. No mitral valve SAM or outflow tract obstruction. Left ventricular ejection fraction, by estimation, is >75%. The left ventricle has hyperdynamic function. The  left ventricle has no regional wall motion abnormalities. The left ventricular internal cavity size was small. There is mild asymmetric left ventricular hypertrophy of the basal-septal segment. Indeterminate diastolic filling due to E-A fusion. Right Ventricle: The right ventricular size is normal. No increase in right ventricular wall thickness. Right ventricular systolic function is hyperdynamic. Tricuspid regurgitation signal is inadequate for assessing PA pressure. Left Atrium: Left atrial size was normal in size. Right Atrium: Right atrial size was normal in size. Pericardium: Trivial pericardial effusion is present. The pericardial effusion is surrounding the apex. Mitral Valve: The mitral valve is grossly normal. Mild mitral annular calcification. No evidence of mitral valve regurgitation. No evidence of mitral  valve stenosis. Tricuspid Valve: The tricuspid valve is grossly normal. Tricuspid  valve regurgitation is trivial. No evidence of tricuspid stenosis. Aortic Valve: The aortic valve is tricuspid. Aortic valve regurgitation is not visualized. No aortic stenosis is present. Pulmonic Valve: The pulmonic valve was grossly normal. Pulmonic valve regurgitation is not visualized. No evidence of pulmonic stenosis. Aorta: The aortic root is normal in size and structure. Venous: The inferior vena cava is normal in size with greater than 50% respiratory variability, suggesting right atrial pressure of 3 mmHg. IAS/Shunts: The atrial septum is grossly normal.  LEFT VENTRICLE PLAX 2D LVIDd:         4.00 cm     Diastology LVIDs:         2.10 cm     LV e' medial:    3.59 cm/s LV PW:         0.90 cm     LV E/e' medial:  20.9 LV IVS:        1.20 cm     LV e' lateral:   6.74 cm/s LVOT diam:     1.90 cm     LV E/e' lateral: 11.1 LV SV:         56 LV SV Index:   34 LVOT Area:     2.84 cm  LV Volumes (MOD) LV vol d, MOD A4C: 58.1 ml LV vol s, MOD A4C: 10.3 ml LV SV MOD A4C:     58.1 ml RIGHT VENTRICLE RV S prime:     23.90 cm/s TAPSE (M-mode): 1.9 cm LEFT ATRIUM             Index        RIGHT ATRIUM          Index LA diam:        3.30 cm 1.99 cm/m   RA Area:     8.57 cm LA Vol (A2C):   25.4 ml 15.30 ml/m  RA Volume:   13.70 ml 8.25 ml/m LA Vol (A4C):   28.6 ml 17.22 ml/m LA Biplane Vol: 27.2 ml 16.38 ml/m  AORTIC VALVE LVOT Vmax:   130.00 cm/s LVOT Vmean:  85.400 cm/s LVOT VTI:    0.197 m  AORTA Ao Root diam: 2.90 cm MITRAL VALVE MV Area (PHT): 9.48 cm     SHUNTS MV Decel Time: 80 msec      Systemic VTI:  0.20 m MV E velocity: 75.10 cm/s   Systemic Diam: 1.90 cm MV A velocity: 139.00 cm/s MV E/A ratio:  0.54 Eleonore Chiquito MD Electronically signed by Eleonore Chiquito MD Signature Date/Time: 12/27/2021/12:28:50 PM    Final         Scheduled Meds:  ezetimibe  10 mg Oral Daily   insulin aspart  0-15 Units Subcutaneous TID WC    insulin glargine-yfgn  30 Units Subcutaneous Daily   potassium chloride  30 mEq Oral Q3H   rivaroxaban   Does not apply Once   rivaroxaban  20 mg Oral Q supper   venlafaxine XR  75 mg Oral Q breakfast   Continuous Infusions:     LOS: 3 days    Time spent: 48 minutes spent on chart review, discussion with nursing staff, consultants, updating family and interview/physical exam; more than 50% of that time was spent in counseling and/or coordination of care.    Foxx Klarich J [place] DO [facility] Available via Epic secure chat 7am-7pm After these hours, please refer to coverage provider listed on [email or web address] 12/29/2021, 10:32 AM

## 2021-12-29 NOTE — Progress Notes (Signed)
Speech Language Pathology Treatment: Dysphagia  Patient Details Name: Wanda [name] MRN: [id number] DOB: [birth year] Today's Date: 12/29/2021 Time: 4098-1191 SLP Time Calculation (min) (ACUTE ONLY): 16 min  Assessment / Plan / Recommendation Clinical Impression  Pt was seen during breakfast for treatment. She was alert and cooperative during the session and she reported that she has been tolerating the current diet without difficulty. These reports were corroborated by her RN who added that the pt has not been eating much. Pt was easily distracted during the meal and required prompts to sustain attention. She became distracted by the towel on her chest and by the TV that was turned off; pt often appeared to be searching for items, but she denied looking for anything. Pt consumed a meal of scrambled eggs, sausage, and thin liquids. No s/s of aspiration were noted with solids or liquids. Labial seal was reduced, but she was able to reduce anterior loss of solids with lingual manipulation of boluses. Mastication was [facility] and oral clearance adequate for the limited boluses consumed. Pt's current regular texture solid and thin liquid diet will be continued and SLP will continue to follow pt.     HPI HPI: Pt is an 81 y.o. female who presented for evaluation of worsening left-sided weakness. MRI brain: acute infarcts in the right MCA territory, with the largest area of infarction in the posterior right frontal lobe, but with additional involvement of the right insula, anterior right frontal lobe, and right parietal lobe. PMH: recent diagnosed DVT on Eliquis, recent stroke in July 2023 on Plavix, HTN, HLD. Pt seen by SLP 07/25/21 with noted impairments in memory, orientation, and problem solving/executive function.      SLP Plan  Continue with current plan of care      Recommendations for follow up therapy are one component of a multi-disciplinary discharge planning process, led by the attending  physician.  Recommendations may be updated based on patient status, additional functional criteria and insurance authorization.    Recommendations  Diet recommendations: Regular;Thin liquid Liquids provided via: Cup;Straw Medication Administration: Whole meds with puree Supervision: Patient able to self feed Compensations: Small sips/bites;Slow rate Postural Changes and/or Swallow Maneuvers: Seated upright 90 degrees                Oral Care Recommendations: Oral care BID Follow Up Recommendations: Skilled nursing-short term rehab (<3 hours/day) Assistance recommended at discharge: Frequent or constant Supervision/Assistance SLP Visit Diagnosis: Dysphagia, unspecified (R13.10) Plan: Continue with current plan of care         Elliot Simoneaux I. Hardin Negus, [place], [place] Office number [phone]  KARLEY PHO  12/29/2021, 10:43 AM

## 2021-12-29 NOTE — Care Management Important Message (Signed)
Important Message  Patient Details  Name: Wanda [name] MRN: [id number] Date of Birth: Oct 07, 1940   Medicare Important Message Given:  Yes     Dona Walby Montine Circle 12/29/2021, 3:39 PM

## 2021-12-29 NOTE — Consult Note (Signed)
   [facility] CM Inpatient Consult   12/29/2021  Wanda [name] 05-09-1940 [id number]  [place] [facility] [ACO] Patient: Medicare ACO REACH  Primary Care Provider:  Darreld Mclean, MD with [place] at [facility] which is listed to provide the Transition of Care follow up   Patient screened for less than 30 days readmission hospitalization with noted high risk score for unplanned readmission risk and to assess for potential [place] Management service needs for post hospital transition for care coordination.  Review of patient's electronic medical record reveals patient is being recommended for a skilled nursing facility level of care for transition and currently resided at [facility] per Therapy notes from patient's daughter.   Plan:  Continue to follow progress and disposition to assess for post hospital community care coordination/management needs.  Referral request for community care coordination:  Provided transition patient could have [facility] RN alerted of SNF for transitional needs, if appropriate, pending long term needs.  Of note, [facility] Care Management/Population Health does not replace or interfere with any arrangements made by the Inpatient Transition of Care team.  For questions contact:   Natividad Brood, RN BSN [place]  [phone] business mobile phone Toll free office [phone]  *[place]  [phone] Fax number: [phone] [email or web address]

## 2021-12-29 NOTE — Inpatient Diabetes Management (Addendum)
Inpatient Diabetes Program Recommendations  AACE/ADA: New Consensus Statement on Inpatient Glycemic Control (2015)  Target Ranges:  Prepandial:   less than 140 mg/dL      Peak postprandial:   less than 180 mg/dL (1-2 hours)      Critically ill patients:  140 - 180 mg/dL   Lab Results  Component Value Date   GLUCAP 250 (H) 12/29/2021   HGBA1C 10.4 (H) 12/27/2021    Review of Glycemic Control  Latest Reference Range & Units 12/28/21 21:53 12/29/21 06:40  Glucose-Capillary 70 - 99 mg/dL 207 (H) 250 (H)  (H): Data is abnormally high  Diabetes history: DM2 Outpatient Diabetes medications:  Semglee 30 QD Novolog 3 units TID Glipizide 10 mg QAM & 5 QPM Current orders for Inpatient glycemic control:  Semglee 30 QD (added today) Novolog 0-15 units TID  Inpatient Diabetes Program Recommendations:    Postprandials are elevated, if they are consisently elevated Please consider:  Novolog 3 units TID with meals if consumes at least 50%  Will continue to follow while inpatient.  Thank you, Reche Dixon, MSN, [place] Diabetes Coordinator Inpatient Diabetes Program [phone] (team pager from 8a-5p)

## 2021-12-29 NOTE — Progress Notes (Signed)
Patient had an episode of nausea with emesis.  She states that currently she no longer feels nauseated.  She had some decrease in her heart rate associated with the episode of emesis, I suspect a vagal reaction.  Her exam is relatively unchanged, significant left hemiparesis with full visual fields and partial orientation.  I do think that given anticoagulation I would like to rule out intracranial hemorrhage and therefore CT would be reasonable.  If this is negative, then I doubt any significant neurological event played a role.  1) CT head 2) if no acute hemorrhage then no changes from a neurological perspective, please see Dr. Clydene Fake note from yesterday for details.   Roland Rack, MD [facility] [phone]  If 7pm- 7am, please page neurology on call as listed in [place].

## 2021-12-30 DIAGNOSIS — I824Y9 Acute embolism and thrombosis of unspecified deep veins of unspecified proximal lower extremity: Secondary | ICD-10-CM | POA: Diagnosis not present

## 2021-12-30 DIAGNOSIS — I10 Essential (primary) hypertension: Secondary | ICD-10-CM | POA: Diagnosis not present

## 2021-12-30 DIAGNOSIS — I639 Cerebral infarction, unspecified: Secondary | ICD-10-CM | POA: Diagnosis not present

## 2021-12-30 DIAGNOSIS — E785 Hyperlipidemia, unspecified: Secondary | ICD-10-CM | POA: Diagnosis not present

## 2021-12-30 LAB — URINALYSIS, ROUTINE W REFLEX MICROSCOPIC
Bilirubin Urine: NEGATIVE
Glucose, UA: 50 mg/dL — AB
Hgb urine dipstick: NEGATIVE
Ketones, ur: 20 mg/dL — AB
Leukocytes,Ua: NEGATIVE
Nitrite: NEGATIVE
Protein, ur: NEGATIVE mg/dL
Specific Gravity, Urine: 1.017 (ref 1.005–1.030)
pH: 5 (ref 5.0–8.0)

## 2021-12-30 LAB — COMPREHENSIVE METABOLIC PANEL WITH GFR
ALT: 11 U/L (ref 0–44)
AST: 11 U/L — ABNORMAL LOW (ref 15–41)
Albumin: 2.8 g/dL — ABNORMAL LOW (ref 3.5–5.0)
Alkaline Phosphatase: 55 U/L (ref 38–126)
Anion gap: 8 (ref 5–15)
BUN: 16 mg/dL (ref 8–23)
CO2: 22 mmol/L (ref 22–32)
Calcium: 10.2 mg/dL (ref 8.9–10.3)
Chloride: 113 mmol/L — ABNORMAL HIGH (ref 98–111)
Creatinine, Ser: 0.74 mg/dL (ref 0.44–1.00)
GFR, Estimated: >60 mL/min (ref >60)
Glucose, Bld: 86 mg/dL (ref 70–99)
Potassium: 3.2 mmol/L — ABNORMAL LOW (ref 3.5–5.1)
Sodium: 143 mmol/L (ref 135–145)
Total Bilirubin: 0.5 mg/dL (ref 0.3–1.2)
Total Protein: 6.1 g/dL — ABNORMAL LOW (ref 6.5–8.1)

## 2021-12-30 LAB — GLUCOSE, CAPILLARY
Glucose-Capillary: 90 mg/dL (ref 70–99)
Glucose-Capillary: 157 mg/dL — ABNORMAL HIGH (ref 70–99)
Glucose-Capillary: 175 mg/dL — ABNORMAL HIGH (ref 70–99)
Glucose-Capillary: 148 mg/dL — ABNORMAL HIGH (ref 70–99)

## 2021-12-30 LAB — CBC
HCT: 30.1 % — ABNORMAL LOW (ref 36.0–46.0)
Hemoglobin: 10.6 g/dL — ABNORMAL LOW (ref 12.0–15.0)
MCH: 31.5 pg (ref 26.0–34.0)
MCHC: 35.2 g/dL (ref 30.0–36.0)
MCV: 89.6 fL (ref 80.0–100.0)
Platelets: 311 K/uL (ref 150–400)
RBC: 3.36 MIL/uL — ABNORMAL LOW (ref 3.87–5.11)
RDW: 14 % (ref 11.5–15.5)
WBC: 7.7 K/uL (ref 4.0–10.5)
nRBC: 0 % (ref 0.0–0.2)

## 2021-12-30 LAB — MAGNESIUM: Magnesium: 1.6 mg/dL — ABNORMAL LOW (ref 1.7–2.4)

## 2021-12-30 MED ORDER — MAGNESIUM SULFATE 50 % IJ SOLN
3.0000 g | Freq: Once | INTRAVENOUS | Status: AC
  Administered 2021-12-30: 3 g via INTRAVENOUS
  Filled 2021-12-30: qty 6

## 2021-12-30 MED ORDER — CLONIDINE HCL 0.1 MG PO TABS: 0.1000 mg | ORAL_TABLET | Freq: Two times a day (BID) | ORAL | Status: DC

## 2021-12-30 MED ORDER — AMLODIPINE BESYLATE 5 MG PO TABS
5.0000 mg | ORAL_TABLET | Freq: Every day | ORAL | Status: DC
  Administered 2021-12-30 – 2022-01-04 (×6): 5 mg via ORAL
  Filled 2021-12-30 (×6): qty 1

## 2021-12-30 MED ORDER — POTASSIUM CHLORIDE CRYS ER 20 MEQ PO TBCR
40.0000 meq | EXTENDED_RELEASE_TABLET | Freq: Once | ORAL | Status: AC
  Administered 2021-12-30: 40 meq via ORAL
  Filled 2021-12-30: qty 2

## 2021-12-30 NOTE — Inpatient Diabetes Management (Signed)
Inpatient Diabetes Program Recommendations  AACE/ADA: New Consensus Statement on Inpatient Glycemic Control (2015)  Target Ranges:  Prepandial:   less than 140 mg/dL      Peak postprandial:   less than 180 mg/dL (1-2 hours)      Critically ill patients:  140 - 180 mg/dL   Lab Results  Component Value Date   GLUCAP 90 12/30/2021   HGBA1C 10.4 (H) 12/27/2021    Review of Glycemic Control  Latest Reference Range & Units 12/29/21 17:14 12/29/21 21:19 12/30/21 06:07  Glucose-Capillary 70 - 99 mg/dL 173 (H) 122 (H) 90  (H): Data is abnormally high  Diabetes history: DM2 Outpatient Diabetes medications:  Semglee 30 QD Novolog 3 units TID Glipizide 10 mg QAM & 5 QPM Current orders for Inpatient glycemic control:  Semglee 30 QD  Novolog 0-15 units TID  Inpatient Diabetes Program Recommendations:    Fasting was 90 mg/dl this am.  Might consider;  Semglee 25 units QD  Will continue to follow while inpatient.  Thank you, Reche Dixon, MSN, [place] Diabetes Coordinator Inpatient Diabetes Program [phone] (team pager from 8a-5p)

## 2021-12-30 NOTE — Progress Notes (Signed)
PT Cancellation Note  Patient Details Name: Wanda [name] MRN: [id number] DOB: 11-08-1940   Cancelled Treatment:    Reason Eval/Treat Not Completed: Patient declined, no reason specified Attempted to see pt for PT treatment, however upon entering pt appeared asleep with eyes closed and did not respond to therapist. Pt aroused with gentle tactile stimuli but refused to open eyes, shaking head no and swatted therapist's hand away. Asked if pt wanted to be left alone and pt nodded head yes; ultimately refusing to participate.   Betsey Holiday Lyn Hollingshead PT, DPT  12/30/2021, 12:25 PM

## 2021-12-30 NOTE — TOC Initial Note (Addendum)
Transition of Care [facility]) - Initial/Assessment Note    Patient Details  Name: Wanda [name] MRN: [id number] Date of Birth: 1940/11/12  [facility]) CM/SW Contact:    Jinger Neighbors, LCSW Phone Number: 12/30/2021, 10:57 AM  Clinical Narrative:                 CSW met with pt's daughter at bedside to discuss PT recommends. Pt from [place] and CSW called and they do not have a SNF locally, only in [place]. Pt's daughter lives in [place] and prefers SNF locally. CSW will complete work up and fax out. TOC following.  Work up and fax out complete.   Expected Discharge Plan: Skilled Nursing Facility Barriers to Discharge: Continued Medical Work up   Patient Goals and CMS Choice   CMS Medicare.gov Compare Post Acute Care list provided to:: Patient Represenative (must comment) (pt daughter) Choice offered to / list presented to : Adult Children      Expected Discharge Plan and Services     Post Acute Care Choice: [place] Living arrangements for the past 2 months: [place]                                      Prior Living Arrangements/Services Living arrangements for the past 2 months: [place]   Patient language and need for interpreter reviewed:: Yes Do you feel safe going back to the place where you live?: Yes      Need for Family Participation in Patient Care: Yes (Comment) Care giver support system in place?: Yes (comment)   Criminal Activity/Legal Involvement Pertinent to Current Situation/Hospitalization: No - Comment as needed  Activities of Daily Living Home Assistive Devices/Equipment: Eyeglasses, Other (Comment) (Pt is from a ALF Devices and Equipment provied) ADL Screening (condition at time of admission) Patient's cognitive ability adequate to safely complete daily activities?: No Is the patient deaf or have difficulty hearing?: No Does the patient have difficulty seeing, even when  wearing glasses/contacts?: No Does the patient have difficulty concentrating, remembering, or making decisions?: Yes Patient able to express need for assistance with ADLs?: No Does the patient have difficulty dressing or bathing?: Yes Independently performs ADLs?: No Communication: Needs assistance Is this a change from baseline?: Change from baseline, expected to last >3 days Dressing (OT): Dependent Is this a change from baseline?: Change from baseline, expected to last >3 days Grooming: Dependent Is this a change from baseline?: Change from baseline, expected to last >3 days Feeding: Needs assistance Is this a change from baseline?: Change from baseline, expected to last <3 days Bathing: Dependent Is this a change from baseline?: Change from baseline, expected to last >3 days Toileting: Dependent Is this a change from baseline?: Change from baseline, expected to last >3days In/Out Bed: Dependent Is this a change from baseline?: Change from baseline, expected to last <3 days Walks in Home: Dependent Is this a change from baseline?: Change from baseline, expected to last >3 days Does the patient have difficulty walking or climbing stairs?: Yes Weakness of Legs: Left Weakness of Arms/Hands: Left  Permission Sought/Granted                  Emotional Assessment Appearance:: Appears older than stated age Attitude/Demeanor/Rapport: Unable to Assess Affect (typically observed): Unable to Assess   Alcohol / Substance Use: Not Applicable Psych Involvement: No (comment)  Admission diagnosis:  Stroke (cerebrum) [facility]) [I63.9] Cerebrovascular accident (CVA) due to thrombosis of right anterior cerebral artery ([place]) [I63.321] Cerebrovascular accident (CVA), unspecified mechanism ([place]) [I63.9] Patient Active Problem List   Diagnosis Date Noted   Acute DVT (deep venous thrombosis) ([place]) 12/01/2021   Elevated liver enzymes 12/01/2021   History of CVA (cerebrovascular accident)  12/01/2021   Mood disorder ([place]) 12/01/2021   DVT (deep venous thrombosis) ([place]) 12/01/2021   Hypokalemia 07/26/2021   Fever 07/25/2021   Stroke (cerebrum) ([place]) 07/24/2021   Carotid stenosis 11/13/2018   Brainstem infarct, acute ([place])    Primary osteoarthritis of left knee 10/04/2015   Osteoporosis 08/23/2015   Multiple thyroid nodules 04/07/2014   Insulin dependent type 2 diabetes mellitus ([place]) 10/09/2013   Insulin adverse reaction 09/27/2013   Senile nuclear sclerosis 08/14/2012   Essential hypertension 03/10/2011   Hyperlipidemia with target LDL less than 70 03/10/2011   Obesity (BMI 30-39.9) 03/10/2011   PCP:  Darreld Mclean, MD Pharmacy:   [place], [place] - [address] [place] [address] Phone: [phone] Fax: [phone]  [facility] [address] [place] [address] Phone: [phone] Fax: [phone]     Social Determinants of Health (SDOH) Social History: SDOH Screenings   Food Insecurity: No Food Insecurity (03/18/2020)  Housing: Low Risk  (03/18/2020)  Transportation Needs: No Transportation Needs (03/18/2020)  Alcohol Screen: Low Risk  (03/18/2020)  Depression (PHQ2-9): Low Risk  (03/18/2020)  Financial Resource Strain: Low Risk  (03/18/2020)  Physical Activity: Inactive (03/18/2020)  Social Connections: Socially Isolated (03/18/2020)  Stress: No Stress Concern Present (03/18/2020)  Tobacco Use: Low Risk  (12/26/2021)   SDOH Interventions:     Readmission Risk Interventions     No data to display

## 2021-12-30 NOTE — Progress Notes (Signed)
[facility]                                                                              Wanda [name], is a 81 y.o. female, DOB - 08/27/1940, VEH:[id number] Admit date - 12/26/2021    Outpatient Primary MD for the patient is Copland, Gay Filler, MD  LOS - 4  days  Chief Complaint  Patient presents with   Extremity Weakness       Brief summary   Patient is a 81 y.o. female with past medical history significant for HTN, HLD, recent DVT on Eliquis, recent CVA July 2023 on Plavix who presented to [facility] ED on 12/18 from [facility] complaining of left arm and leg weakness.  Onset of symptoms Thursday 12/22/21.  On the day of arrival to the ED, patient was also noted to have a left-sided facial droop with slurred speech.   In the ED, temperature 98.2 F, HR 107, RR 17, BP 162/78, SpO2 96% on room air.  Sodium 137, potassium 3.5, chloride 102, CO2 21, glucose 113, BUN 14, creatinine 0.78.  WBC 12.8, hemoglobin 14.7, platelets 274.  INR 1.3.  EtOH level less than 10.  CT head without contrast with new interval infarct posterior right frontal lobe.  MR brain and MRA head/neck with acute infarcts right MCA territory with the largest area of infarction posterior right frontal lobe with additional involvement of the right insula, anterior right frontal lobe and right parietal lobe, severe stenosis origin in the right ICA.  Neurology was consulted.  TRH consulted for admission for further evaluation and management of acute CVA.   Assessment & Plan    Principal Problem: Right MCA CVA, acute Symptomatic severe right ICA stenosis with near occlusion -Presented to ED with left-sided weakness, facial droop and slurred speech.  -MRI brain, MRA head and neck showed acute infarcts right MCA territory with the largest area of infarction posterior right frontal lobe with additional involvement of the right insula, anterior right frontal lobe and right parietal lobe, severe stenosis origin in  the right ICA.  -Neurology consulted - Carotid Doppler with 1-39% stenosis of left ICA, 80-99 stenosis of right ICA.  -2D echo showed EF > 75%, mild basal septal LVH, G1 DD, normal left atrial size, normal atrial septum -LDL 120, hemoglobin A1c 10.4 -Neuro IR was consulted for potential stenting of right ICA; but family and patient decided not to proceed with angiogram and stenting at this time as they are aware of risks to include decreased quality of life, additional strokes and possibly death.  -PT recommended SNF, passed SLP evaluation neurology now signed off.  Seen by speech therapy and passed swallow evaluation.  Currently on carb modified diet -- heparin drip transitioned to Xarelto per daughter's request on 12/20 -- Pending SNF   Nausea/vomiting: -On 12/21, has resolved  Acute metabolic encephalopathy Etiology likely secondary to large CVA, right frontal lobe.  Low suspicion for seizure.  - UA ordered again, still pending   Essential hypertension -BP elevated, will resume amlodipine today -Permissive hypertension due to acute stroke.  Home medications include amlodipine 5 mg p.o. daily, clonidine 0.1 mg  p.o. twice daily.    Hyperlipidemia LDL 120.  Intolerant to statins. Continue Zetia '10mg'$  daily   Thyroid nodules Incidental finding on carotid ultrasound.   Hypomagnesemia Hypokalemia - K 3.2, magnesium 1.6, replaced   Type 2 diabetes mellitus, poorly controlled with hyperglycemia, IDDM Home regimen includes insulin glargine 30 units daily, insulin aspart 3 units 3 times daily AC.   -Hemoglobin A1c 10.4, poorly controlled  Recent Labs    12/28/21 2153 12/29/21 0640 12/29/21 1229 12/29/21 1714 12/29/21 2119 12/30/21 0607  GLUCAP 207* 250* 312* 173* 122* 90   -CBGs now improving, continue Semglee 30 units daily, moderate SSI    Recent DVT on anticoagulation On Eliquis outpatient. -- Transitioned heparin drip to Xarelto 12/20 per daughter's request    Anxiety/depression: On venlafaxine outpatient.   Pressure injury documentation Left buttock stage II  Code Status: DNR DVT Prophylaxis:   rivaroxaban (XARELTO) tablet 20 mg   Level of Care: Level of care: Telemetry Medical Family Communication: No family at the bedside Disposition Plan:      Remains inpatient appropriate: Awaiting SNF   Procedures:  2D echo  Consultants:   Neurology, neurointerventional radiology  Antimicrobials:   Medications  ezetimibe  10 mg Oral Daily   insulin aspart  0-15 Units Subcutaneous TID WC   insulin glargine-yfgn  30 Units Subcutaneous Daily   rivaroxaban  20 mg Oral Q supper   venlafaxine XR  75 mg Oral Q breakfast      Subjective:   Wanda [name] was seen and examined today.  No acute issues, BP somewhat elevated, no headache, blurry vision or worsening weakness.  Continues to have dysarthria with left-sided weakness otherwise following commands.  Objective:   Vitals:   12/30/21 0305 12/30/21 0340 12/30/21 0743 12/30/21 0854  BP: (!) 169/69 (!) 138/59 (!) 165/73 (!) 189/86  Pulse:   91 87  Resp:   18 17  Temp: 98.2 F (36.8 C)  98.2 F (36.8 C) 98.1 F (36.7 C)  TempSrc: Axillary   Oral  SpO2: 96% 97% 94% 99%  Weight:      Height:       No intake or output data in the 24 hours ending 12/30/21 1103   Wt Readings from Last 3 Encounters:  12/26/21 67 kg  12/02/21 67.6 kg  07/24/21 78 kg     Exam General: Alert and oriented x 3, ill-appearing Cardiovascular: S1 S2 auscultated,  RRR Respiratory: Clear to auscultation bilaterally Gastrointestinal: Soft, nontender, nondistended, + bowel sounds Ext: no pedal edema bilaterally Neuro: left-sided hemiparesis, dysarthria Psych: Normal affect     Data Reviewed:  I have personally reviewed following labs    CBC Lab Results  Component Value Date   WBC 7.7 12/30/2021   RBC 3.36 (L) 12/30/2021   HGB 10.6 (L) 12/30/2021   HCT 30.1 (L) 12/30/2021   MCV 89.6 12/30/2021    MCH 31.5 12/30/2021   PLT 311 12/30/2021   MCHC 35.2 12/30/2021   RDW 14.0 12/30/2021   LYMPHSABS 0.7 12/28/2021   MONOABS 0.9 12/28/2021   EOSABS 0.0 12/28/2021   BASOSABS 0.0 20/25/4270     Last metabolic panel Lab Results  Component Value Date   NA 143 12/30/2021   K 3.2 (L) 12/30/2021   CL 113 (H) 12/30/2021   CO2 22 12/30/2021   BUN 16 12/30/2021   CREATININE 0.74 12/30/2021   GLUCOSE 86 12/30/2021   GFRNONAA >60 12/30/2021   GFRAA >60 11/14/2018   CALCIUM 10.2 12/30/2021  PHOS 2.3 (L) 12/28/2021   PROT 6.1 (L) 12/30/2021   ALBUMIN 2.8 (L) 12/30/2021   BILITOT 0.5 12/30/2021   ALKPHOS 55 12/30/2021   AST 11 (L) 12/30/2021   ALT 11 12/30/2021   ANIONGAP 8 12/30/2021    CBG (last 3)  Recent Labs    12/29/21 1714 12/29/21 2119 12/30/21 0607  GLUCAP 173* 122* 90      Coagulation Profile: Recent Labs  Lab 12/26/21 1435  INR 1.3*     Radiology Studies: I have personally reviewed the imaging studies  DG Abd 1 View  Result Date: 12/29/2021 CLINICAL DATA:  Intractable sickle vomiting with nausea EXAM: ABDOMEN - 1 VIEW COMPARISON:  Radiograph 12/27/2021 FINDINGS: Nasogastric tube is been removed. There is mild distension of the stomach and large bowel. There is no evidence of bowel obstruction. Degenerative changes of the spine. Vascular calcifications. No acute osseous abnormality. IMPRESSION: No evidence of bowel obstruction. Electronically Signed   By: Maurine Simmering M.D.   On: 12/29/2021 13:03   CT HEAD WO CONTRAST (5MM)  Result Date: 12/29/2021 CLINICAL DATA:  Stroke follow-up EXAM: CT HEAD WITHOUT CONTRAST TECHNIQUE: Contiguous axial images were obtained from the base of the skull through the vertex without intravenous contrast. RADIATION DOSE REDUCTION: This exam was performed according to the departmental dose-optimization program which includes automated exposure control, adjustment of the mA and/or kV according to patient size and/or use of iterative  reconstruction technique. COMPARISON:  12/26/2021 CT head and MRI head FINDINGS: Brain: Hypodensity in the right MCA territory, particularly the anterior and posterior right frontal lobe, right insula, and right parietal lobe, which correlates with the acute infarct seen on the 12/26/2021 CT. No evidence of hemorrhagic transformation, mass effect, or midline shift. No evidence of additional acute infarct. No hydrocephalus or extra-axial collection. Vascular: No hyperdense vessel. Atherosclerotic calcifications in the intracranial carotid and vertebral arteries. Skull: Normal. Negative for fracture or focal lesion. Sinuses/Orbits: Mild mucosal thickening in the left maxillary sinus and ethmoid air cells. Status post bilateral lens replacements. Other: The mastoid air cells are well aerated. IMPRESSION: Evolving right MCA territory infarct, without evidence of hemorrhagic transformation, mass effect, or midline shift. Electronically Signed   By: Merilyn Baba M.D.   On: 12/29/2021 03:20       Isaac Lacson M.D. [facility] 12/30/2021, 11:03 AM  Available via Epic secure chat 7am-7pm After 7 pm, please refer to night coverage provider listed on amion.

## 2021-12-30 NOTE — NC FL2 (Signed)
[place] LEVEL OF CARE FORM     IDENTIFICATION  Patient Name: Wanda [name] Birthdate: August 17, 1940 Sex: female Admission Date (Current Location): 12/26/2021  [facility] and [place] Number:  Herbalist and Address:  [facility], [place] [address], [place], [place] [address]      Provider Number: [id number]  Attending Physician Name and Address:  Mendel Corning, MD  Relative Name and Phone Number:  [name] [phone]    Current Level of Care: SNF Recommended Level of Care: [place] Prior Approval Number:    Date Approved/Denied:   PASRR Number:    Discharge Plan: SNF    Current Diagnoses: Patient Active Problem List   Diagnosis Date Noted   Acute DVT (deep venous thrombosis) ([place]) 12/01/2021   Elevated liver enzymes 12/01/2021   History of CVA (cerebrovascular accident) 12/01/2021   Mood disorder ([place]) 12/01/2021   DVT (deep venous thrombosis) ([place]) 12/01/2021   Hypokalemia 07/26/2021   Fever 07/25/2021   Stroke (cerebrum) ([place]) 07/24/2021   Carotid stenosis 11/13/2018   Brainstem infarct, acute ([place])    Primary osteoarthritis of left knee 10/04/2015   Osteoporosis 08/23/2015   Multiple thyroid nodules 04/07/2014   Insulin dependent type 2 diabetes mellitus ([place]) 10/09/2013   Insulin adverse reaction 09/27/2013   Senile nuclear sclerosis 08/14/2012   Essential hypertension 03/10/2011   Hyperlipidemia with target LDL less than 70 03/10/2011   Obesity (BMI 30-39.9) 03/10/2011    Orientation RESPIRATION BLADDER Height & Weight     Self  Normal External catheter, Incontinent Weight: 147 lb 11.3 oz (67 kg) Height:  '5\' 1"'$  (154.9 cm)  BEHAVIORAL SYMPTOMS/MOOD NEUROLOGICAL BOWEL NUTRITION STATUS      Incontinent Diet (see d/c summary)  AMBULATORY STATUS COMMUNICATION OF NEEDS Skin   Extensive Assist Verbally (slurred/dysarthria) Normal                       Personal Care Assistance  Level of Assistance  Bathing, Feeding, Dressing Bathing Assistance: Maximum assistance Feeding assistance: Maximum assistance Dressing Assistance: Maximum assistance     Functional Limitations Info             SPECIAL CARE FACTORS FREQUENCY  PT (By licensed PT), OT (By licensed OT)     PT Frequency: 5x/wk OT Frequency: 5x/wk            Contractures Contractures Info: Not present    Additional Factors Info  Code Status, Allergies Code Status Info: DNR Allergies Info: Reaction Severity Reaction Type Noted  Allergies     Ace Inhibitors   Diarrhea, Swelling, Other (See Comments), Cough  Not Specified  Allergy  03/23/2011  Pt had cough and diarrhea with first few doses of medication; also had swelling of right eyelid. Stopped medication on 03/17/11.  Aspirin   Hives  Not Specified  Allergy  12/12/2010  Metformin And Related   Not Specified  Allergy  03/09/2011  Severe abdominal pain  Shellfish Allergy   Nausea Only  Not Specified  Allergy  10/04/2015  Intolerant of fresh shellfish, reports the reaction is GI upset, denies hives, denies any swelling   Reports that she can tolerate canned seafood.   Statins   Other (See Comments)  Not Specified  12/01/2021  myalgia  Adverse Reactions/Drug Intolerances     Alendronate Sodium   Other (See Comments)  Not Specified  Intolerance  12/12/2010  "Made my whole body hurt"  Crestor (Rosuvastatin)  Other (See Comments)  Not Specified  Intolerance  11/13/2018  "Made my whole body hurt"  Lipitor (Atorvastatin Calcium)   Other (See Comments)  Not Specified  Intolerance  12/12/2010  "Made my whole body hurt"           Current Medications (12/30/2021):  This is the current hospital active medication list Current Facility-Administered Medications  Medication Dose Route Frequency Provider Last Rate Last Admin   acetaminophen (TYLENOL) tablet 650 mg  650 mg Oral Q4H PRN Reome, Earle J, RPH       Or   acetaminophen (TYLENOL) 160 MG/5ML solution 650 mg  650 mg Per  Tube Q4H PRN Reome, Earle J, RPH       Or   acetaminophen (TYLENOL) suppository 650 mg  650 mg Rectal Q4H PRN Reome, Earle J, RPH       ezetimibe (ZETIA) tablet 10 mg  10 mg Oral Daily de Yolanda Manges, Cortney E, NP   10 mg at 12/30/21 0834   hydrALAZINE (APRESOLINE) injection 5 mg  5 mg Intravenous Q6H PRN Wynetta Fines T, MD   5 mg at 12/30/21 1106   insulin aspart (novoLOG) injection 0-15 Units  0-15 Units Subcutaneous TID WC [place] Eric J, DO   3 Units at 12/29/21 1723   insulin glargine-yfgn [facility]) injection 30 Units  30 Units Subcutaneous Daily [place] Eric J, DO   30 Units at 12/30/21 1023   LORazepam (ATIVAN) injection 0.5 mg  0.5 mg Intravenous Q6H PRN Wynetta Fines T, MD       magnesium sulfate 3 g in dextrose 5 % 250 mL  3 g Intravenous Once Rai, Ripudeep K, MD 64 mL/hr at 12/30/21 0822 3 g at 12/30/21 0822   ondansetron (ZOFRAN) injection 4 mg  4 mg Intravenous Q6H PRN [place] Eric J, DO   4 mg at 12/29/21 0757   prochlorperazine (COMPAZINE) injection 10 mg  10 mg Intravenous Q6H PRN [place] Eric J, DO   10 mg at 12/29/21 1215   rivaroxaban (XARELTO) tablet 20 mg  20 mg Oral Q supper [place] Eric J, DO   20 mg at 12/29/21 1642   venlafaxine XR (EFFEXOR-XR) 24 hr capsule 75 mg  75 mg Oral Q breakfast Lequita Halt, MD   75 mg at 12/30/21 7356     Discharge Medications: Please see discharge summary for a list of discharge medications.  Relevant Imaging Results:  Relevant Lab Results:   Additional Information SS#: [id number]  Jinger Neighbors, LCSW

## 2021-12-31 DIAGNOSIS — E785 Hyperlipidemia, unspecified: Secondary | ICD-10-CM | POA: Diagnosis not present

## 2021-12-31 DIAGNOSIS — I63321 Cerebral infarction due to thrombosis of right anterior cerebral artery: Secondary | ICD-10-CM | POA: Diagnosis not present

## 2021-12-31 DIAGNOSIS — I639 Cerebral infarction, unspecified: Secondary | ICD-10-CM | POA: Diagnosis not present

## 2021-12-31 DIAGNOSIS — I10 Essential (primary) hypertension: Secondary | ICD-10-CM | POA: Diagnosis not present

## 2021-12-31 LAB — GLUCOSE, CAPILLARY
Glucose-Capillary: 128 mg/dL — ABNORMAL HIGH (ref 70–99)
Glucose-Capillary: 138 mg/dL — ABNORMAL HIGH (ref 70–99)
Glucose-Capillary: 248 mg/dL — ABNORMAL HIGH (ref 70–99)
Glucose-Capillary: 246 mg/dL — ABNORMAL HIGH (ref 70–99)

## 2021-12-31 NOTE — Progress Notes (Signed)
[facility]                                                                              Wanda [name], is a 81 y.o. female, DOB - Jan 20, 1940, GYB:[id number] Admit date - 12/26/2021    Outpatient Primary MD for the patient is Copland, Gay Filler, MD  LOS - 5  days  Chief Complaint  Patient presents with   Extremity Weakness       Brief summary   Patient is a 81 y.o. female with past medical history significant for HTN, HLD, recent DVT on Eliquis, recent CVA July 2023 on Plavix who presented to [facility] ED on 12/18 from [facility] complaining of left arm and leg weakness.  Onset of symptoms Thursday 12/22/21.  On the day of arrival to the ED, patient was also noted to have a left-sided facial droop with slurred speech.   In the ED, temperature 98.2 F, HR 107, RR 17, BP 162/78, SpO2 96% on room air.  Sodium 137, potassium 3.5, chloride 102, CO2 21, glucose 113, BUN 14, creatinine 0.78.  WBC 12.8, hemoglobin 14.7, platelets 274.  INR 1.3.  EtOH level less than 10.  CT head without contrast with new interval infarct posterior right frontal lobe.  MR brain and MRA head/neck with acute infarcts right MCA territory with the largest area of infarction posterior right frontal lobe with additional involvement of the right insula, anterior right frontal lobe and right parietal lobe, severe stenosis origin in the right ICA.  Neurology was consulted.  TRH consulted for admission for further evaluation and management of acute CVA.   Assessment & Plan    Principal Problem: Right MCA CVA, acute Symptomatic severe right ICA stenosis with near occlusion -Presented to ED with left-sided weakness, facial droop and slurred speech.  -MRI brain, MRA head and neck showed acute infarcts right MCA territory with the largest area of infarction posterior right frontal lobe with additional involvement of the right insula, anterior right frontal lobe and right parietal lobe, severe stenosis origin in  the right ICA.  -Neurology consulted - Carotid Doppler with 1-39% stenosis of left ICA, 80-99 stenosis of right ICA.  -2D echo showed EF > 75%, mild basal septal LVH, G1 DD, normal left atrial size, normal atrial septum -LDL 120, hemoglobin A1c 10.4 -Neuro IR was consulted for potential stenting of right ICA; but family and patient decided not to proceed with angiogram and stenting at this time as they are aware of risks to include decreased quality of life, additional strokes and possibly death.  -PT recommended SNF, passed SLP evaluation neurology now signed off.  Seen by speech therapy and passed swallow evaluation.  Currently on carb modified diet -- heparin drip transitioned to Xarelto per daughter's request on 12/20 -- No acute issues, pending SNF   Nausea/vomiting: -On 12/21, has resolved  Acute metabolic encephalopathy Etiology likely secondary to large CVA, right frontal lobe.  Low suspicion for seizure.  -UA negative for UTI   Essential hypertension -BP elevated, amlodipine resumed on 12/22 - Home medications include amlodipine 5 mg p.o. daily, clonidine 0.1 mg p.o. twice daily. -Permissive hypertension  due to acute stroke, if BP continues to remain elevated, will resume clonidine in a.m.    Hyperlipidemia LDL 120.  Intolerant to statins. Continue Zetia '10mg'$  daily   Thyroid nodules Incidental finding on carotid ultrasound.   Hypomagnesemia Hypokalemia -Replaced on 12/22   Type 2 diabetes mellitus, poorly controlled with hyperglycemia, IDDM Home regimen includes insulin glargine 30 units daily, insulin aspart 3 units 3 times daily AC.   -Hemoglobin A1c 10.4, poorly controlled  -CBGs improving, continue Semglee 30 units daily, SSI moderate   Recent DVT on anticoagulation On Eliquis outpatient. -- Transitioned heparin drip to Xarelto 12/20 per daughter's request   Anxiety/depression: On venlafaxine outpatient.   Pressure injury documentation Left buttock stage  II  Code Status: DNR DVT Prophylaxis:   rivaroxaban (XARELTO) tablet 20 mg   Level of Care: Level of care: Telemetry Medical Family Communication: No family at the bedside Disposition Plan:      Remains inpatient appropriate: Awaiting SNF   Procedures:  2D echo  Consultants:   Neurology, neurointerventional radiology  Antimicrobials:   Medications  amLODipine  5 mg Oral Daily   ezetimibe  10 mg Oral Daily   insulin aspart  0-15 Units Subcutaneous TID WC   insulin glargine-yfgn  30 Units Subcutaneous Daily   rivaroxaban  20 mg Oral Q supper   venlafaxine XR  75 mg Oral Q breakfast      Subjective:   Wanda [name] was seen and examined today.  No acute issues, no acute headache, chest pain, dizziness, shortness of breath.  No worsening weakness.  Left-sided weakness +  Objective:   Vitals:   12/31/21 0353 12/31/21 0355 12/31/21 0357 12/31/21 0738  BP:  (!) 173/72 (!) 153/83 (!) 186/79  Pulse: 89   96  Resp: '20  20 17  '$ Temp: 98.4 F (36.9 C)   98.1 F (36.7 C)  TempSrc: Axillary   Oral  SpO2:    96%  Weight:      Height:        Intake/Output Summary (Last 24 hours) at 12/31/2021 1047 Last data filed at 12/31/2021 0357 Gross per 24 hour  Intake 852.2 ml  Output 800 ml  Net 52.2 ml     Wt Readings from Last 3 Encounters:  12/26/21 67 kg  12/02/21 67.6 kg  07/24/21 78 kg    Physical Exam General: Alert and oriented x 3, NAD Cardiovascular: S1 S2 clear, RRR.  Respiratory: CTAB, no wheezing, rales or rhonchi Gastrointestinal: Soft, nontender, nondistended, NBS Ext: no pedal edema bilaterally Neuro: left-sided hemiparesis, dysarthria Psych: Normal affect     Data Reviewed:  I have personally reviewed following labs    CBC Lab Results  Component Value Date   WBC 7.7 12/30/2021   RBC 3.36 (L) 12/30/2021   HGB 10.6 (L) 12/30/2021   HCT 30.1 (L) 12/30/2021   MCV 89.6 12/30/2021   MCH 31.5 12/30/2021   PLT 311 12/30/2021   MCHC 35.2  12/30/2021   RDW 14.0 12/30/2021   LYMPHSABS 0.7 12/28/2021   MONOABS 0.9 12/28/2021   EOSABS 0.0 12/28/2021   BASOSABS 0.0 00/86/7619     Last metabolic panel Lab Results  Component Value Date   NA 143 12/30/2021   K 3.2 (L) 12/30/2021   CL 113 (H) 12/30/2021   CO2 22 12/30/2021   BUN 16 12/30/2021   CREATININE 0.74 12/30/2021   GLUCOSE 86 12/30/2021   GFRNONAA >60 12/30/2021   GFRAA >60 11/14/2018   CALCIUM 10.2 12/30/2021  PHOS 2.3 (L) 12/28/2021   PROT 6.1 (L) 12/30/2021   ALBUMIN 2.8 (L) 12/30/2021   BILITOT 0.5 12/30/2021   ALKPHOS 55 12/30/2021   AST 11 (L) 12/30/2021   ALT 11 12/30/2021   ANIONGAP 8 12/30/2021    CBG (last 3)  Recent Labs    12/30/21 1656 12/30/21 2125 12/31/21 0606  GLUCAP 175* 148* 128*      Coagulation Profile: Recent Labs  Lab 12/26/21 1435  INR 1.3*     Radiology Studies: I have personally reviewed the imaging studies  DG Abd 1 View  Result Date: 12/29/2021 CLINICAL DATA:  Intractable sickle vomiting with nausea EXAM: ABDOMEN - 1 VIEW COMPARISON:  Radiograph 12/27/2021 FINDINGS: Nasogastric tube is been removed. There is mild distension of the stomach and large bowel. There is no evidence of bowel obstruction. Degenerative changes of the spine. Vascular calcifications. No acute osseous abnormality. IMPRESSION: No evidence of bowel obstruction. Electronically Signed   By: Maurine Simmering M.D.   On: 12/29/2021 13:03       Mykiah Schmuck M.D. [facility] 12/31/2021, 10:47 AM  Available via Epic secure chat 7am-7pm After 7 pm, please refer to night coverage provider listed on amion.

## 2021-12-31 NOTE — Discharge Instructions (Signed)
Information on my medicine - XARELTO (rivaroxaban)  WHY WAS XARELTO PRESCRIBED FOR YOU? Xarelto was prescribed to treat blood clots that may have been found in the veins of your legs (deep vein thrombosis) or in your lungs (pulmonary embolism) and to reduce the risk of them occurring again.  What do you need to know about Xarelto? The dose is one 20 mg tablet taken ONCE A DAY with your evening meal.  DO NOT stop taking Xarelto without talking to the health care provider who prescribed the medication.  Refill your prescription for 20 mg tablets before you run out.  After discharge, you should have regular check-up appointments with your healthcare provider that is prescribing your Xarelto.  In the future your dose may need to be changed if your kidney function changes by a significant amount.  What do you do if you miss a dose? If you are taking Xarelto TWICE DAILY and you miss a dose, take it as soon as you remember. You may take two 15 mg tablets (total 30 mg) at the same time then resume your regularly scheduled 15 mg twice daily the next day.  If you are taking Xarelto ONCE DAILY and you miss a dose, take it as soon as you remember on the same day then continue your regularly scheduled once daily regimen the next day. Do not take two doses of Xarelto at the same time.   Important Safety Information Xarelto is a blood thinner medicine that can cause bleeding. You should call your healthcare provider right away if you experience any of the following: Bleeding from an injury or your nose that does not stop. Unusual colored urine (red or dark brown) or unusual colored stools (red or black). Unusual bruising for unknown reasons. A serious fall or if you hit your head (even if there is no bleeding).  Some medicines may interact with Xarelto and might increase your risk of bleeding while on Xarelto. To help avoid this, consult your healthcare provider or pharmacist prior to using any  new prescription or non-prescription medications, including herbals, vitamins, non-steroidal anti-inflammatory drugs (NSAIDs) and supplements.  This website has more information on Xarelto: [email or web address]

## 2021-12-31 NOTE — Plan of Care (Signed)
Pt remains disoriented no family at bedside

## 2022-01-01 DIAGNOSIS — I63321 Cerebral infarction due to thrombosis of right anterior cerebral artery: Secondary | ICD-10-CM | POA: Diagnosis not present

## 2022-01-01 DIAGNOSIS — I639 Cerebral infarction, unspecified: Secondary | ICD-10-CM | POA: Diagnosis not present

## 2022-01-01 DIAGNOSIS — E785 Hyperlipidemia, unspecified: Secondary | ICD-10-CM | POA: Diagnosis not present

## 2022-01-01 DIAGNOSIS — I824Y9 Acute embolism and thrombosis of unspecified deep veins of unspecified proximal lower extremity: Secondary | ICD-10-CM | POA: Diagnosis not present

## 2022-01-01 LAB — GLUCOSE, CAPILLARY
Glucose-Capillary: 104 mg/dL — ABNORMAL HIGH (ref 70–99)
Glucose-Capillary: 131 mg/dL — ABNORMAL HIGH (ref 70–99)
Glucose-Capillary: 120 mg/dL — ABNORMAL HIGH (ref 70–99)
Glucose-Capillary: 195 mg/dL — ABNORMAL HIGH (ref 70–99)

## 2022-01-01 LAB — BASIC METABOLIC PANEL
Anion gap: 8 (ref 5–15)
BUN: 16 mg/dL (ref 8–23)
CO2: 22 mmol/L (ref 22–32)
Calcium: 9.6 mg/dL (ref 8.9–10.3)
Chloride: 106 mmol/L (ref 98–111)
Creatinine, Ser: 0.65 mg/dL (ref 0.44–1.00)
GFR, Estimated: >60 mL/min (ref >60)
Glucose, Bld: 101 mg/dL — ABNORMAL HIGH (ref 70–99)
Potassium: 3.6 mmol/L (ref 3.5–5.1)
Sodium: 136 mmol/L (ref 135–145)

## 2022-01-01 LAB — MAGNESIUM: Magnesium: 1.5 mg/dL — ABNORMAL LOW (ref 1.7–2.4)

## 2022-01-01 LAB — POTASSIUM: Potassium: 3.4 mmol/L — ABNORMAL LOW (ref 3.5–5.1)

## 2022-01-01 MED ORDER — POTASSIUM CHLORIDE CRYS ER 20 MEQ PO TBCR
40.0000 meq | EXTENDED_RELEASE_TABLET | Freq: Once | ORAL | Status: AC
  Administered 2022-01-01: 40 meq via ORAL
  Filled 2022-01-01: qty 2

## 2022-01-01 MED ORDER — MAGNESIUM SULFATE 2 GM/50ML IV SOLN
2.0000 g | Freq: Once | INTRAVENOUS | Status: AC
  Administered 2022-01-01: 2 g via INTRAVENOUS
  Filled 2022-01-01: qty 50

## 2022-01-01 NOTE — Progress Notes (Signed)
[facility]                                                                              Wanda [name], is a 81 y.o. female, DOB - 05/16/1940, [id number]:[id number] Admit date - 12/26/2021    Outpatient Primary MD for the patient is Copland, Gay Filler, MD  LOS - 6  days  Chief Complaint  Patient presents with   Extremity Weakness       Brief summary   Patient is a 81 y.o. female with past medical history significant for HTN, HLD, recent DVT on Eliquis, recent CVA July 2023 on Plavix who presented to [facility] ED on 12/18 from [facility] complaining of left arm and leg weakness.  Onset of symptoms Thursday 12/22/21.  On the day of arrival to the ED, patient was also noted to have a left-sided facial droop with slurred speech.   In the ED, temperature 98.2 F, HR 107, RR 17, BP 162/78, SpO2 96% on room air.  Sodium 137, potassium 3.5, chloride 102, CO2 21, glucose 113, BUN 14, creatinine 0.78.  WBC 12.8, hemoglobin 14.7, platelets 274.  INR 1.3.  EtOH level less than 10.  CT head without contrast with new interval infarct posterior right frontal lobe.  MR brain and MRA head/neck with acute infarcts right MCA territory with the largest area of infarction posterior right frontal lobe with additional involvement of the right insula, anterior right frontal lobe and right parietal lobe, severe stenosis origin in the right ICA.  Neurology was consulted.  TRH consulted for admission for further evaluation and management of acute CVA.   Assessment & Plan    Principal Problem: Right MCA CVA, acute Symptomatic severe right ICA stenosis with near occlusion -Presented to ED with left-sided weakness, facial droop and slurred speech.  -MRI brain, MRA head and neck showed acute infarcts right MCA territory with the largest area of infarction posterior right frontal lobe with additional involvement of the right insula, anterior right frontal lobe and right parietal lobe, severe stenosis origin in  the right ICA.  -Neurology consulted - Carotid Doppler with 1-39% stenosis of left ICA, 80-99 stenosis of right ICA.  -2D echo showed EF > 75%, mild basal septal LVH, G1 DD, normal left atrial size, normal atrial septum -LDL 120, hemoglobin A1c 10.4 -Neuro IR was consulted for potential stenting of right ICA; but family and patient decided not to proceed with angiogram and stenting at this time as they are aware of risks to include decreased quality of life, additional strokes and possibly death.  -PT recommended SNF, passed SLP evaluation neurology now signed off.  Seen by speech therapy and passed swallow evaluation.  Currently on carb modified diet -- heparin drip transitioned to Xarelto per daughter's request on 12/20 -- No acute issues, pending SNF   Nausea/vomiting: -On 12/21, has resolved  Acute metabolic encephalopathy Etiology likely secondary to large CVA, right frontal lobe.  Low suspicion for seizure.  -UA negative for UTI   Essential hypertension -BP elevated, amlodipine resumed on 12/22 - Home medications include amlodipine 5 mg p.o. daily, clonidine 0.1 mg p.o. twice daily. -Permissive hypertension  due to acute stroke   Hyperlipidemia LDL 120.  Intolerant to statins. Continue Zetia '10mg'$  daily   Thyroid nodules Incidental finding on carotid ultrasound.   Hypomagnesemia Hypokalemia -Potassium 3.6, normal   Type 2 diabetes mellitus, poorly controlled with hyperglycemia, IDDM Home regimen includes insulin glargine 30 units daily, insulin aspart 3 units 3 times daily AC.   -Hemoglobin A1c 10.4, poorly controlled  Recent Labs    12/31/21 0606 12/31/21 1110 12/31/21 1623 12/31/21 2118 01/01/22 0602 01/01/22 1124  GLUCAP 128* 138* 248* 246* 104* 131*  -CBG stable, continue Semglee 30 units daily, SSI moderate    Recent DVT on anticoagulation On Eliquis outpatient. -- Transitioned heparin drip to Xarelto 12/20 per daughter's request   Anxiety/depression: On  venlafaxine outpatient.   Pressure injury documentation Left buttock stage II  Code Status: DNR DVT Prophylaxis:   rivaroxaban (XARELTO) tablet 20 mg   Level of Care: Level of care: Telemetry Medical Family Communication: No family at the bedside Disposition Plan:      Remains inpatient appropriate: Awaiting SNF, no acute issues   Procedures:  2D echo  Consultants:   Neurology, neurointerventional radiology  Antimicrobials:   Medications  amLODipine  5 mg Oral Daily   ezetimibe  10 mg Oral Daily   insulin aspart  0-15 Units Subcutaneous TID WC   insulin glargine-yfgn  30 Units Subcutaneous Daily   rivaroxaban  20 mg Oral Q supper   venlafaxine XR  75 mg Oral Q breakfast      Subjective:   Wanda [name] was seen and examined today.  No acute issues, left-sided weakness.  No headache, chest pain, dizziness, shortness of breath.  No acute events overnight.  Objective:   Vitals:   01/01/22 0407 01/01/22 0524 01/01/22 0743 01/01/22 1123  BP: (!) 141/71  139/64 (!) 177/68  Pulse: 96  87 88  Resp: '16  20 17  '$ Temp: 97.8 F (36.6 C)  97.9 F (36.6 C) 98.8 F (37.1 C)  TempSrc: Oral  Oral Oral  SpO2: 97%  97% 100%  Weight:  64.9 kg    Height:        Intake/Output Summary (Last 24 hours) at 01/01/2022 1231 Last data filed at 01/01/2022 0400 Gross per 24 hour  Intake --  Output 400 ml  Net -400 ml     Wt Readings from Last 3 Encounters:  01/01/22 64.9 kg  12/02/21 67.6 kg  07/24/21 78 kg    Physical Exam General: Alert and awake, NAD Cardiovascular: S1 S2 clear, RRR.  Respiratory: CTAB, no wheezing, rales or rhonchi Gastrointestinal: Soft, nontender, nondistended, NBS Ext: no pedal edema bilaterally Neuro: left hemiparesis, dysarthria improving Skin: No rashes Psych: Normal affect    Data Reviewed:  I have personally reviewed following labs    CBC Lab Results  Component Value Date   WBC 7.7 12/30/2021   RBC 3.36 (L) 12/30/2021   HGB 10.6 (L)  12/30/2021   HCT 30.1 (L) 12/30/2021   MCV 89.6 12/30/2021   MCH 31.5 12/30/2021   PLT 311 12/30/2021   MCHC 35.2 12/30/2021   RDW 14.0 12/30/2021   LYMPHSABS 0.7 12/28/2021   MONOABS 0.9 12/28/2021   EOSABS 0.0 12/28/2021   BASOSABS 0.0 26/71/2458     Last metabolic panel Lab Results  Component Value Date   NA 136 01/01/2022   K 3.6 01/01/2022   CL 106 01/01/2022   CO2 22 01/01/2022   BUN 16 01/01/2022   CREATININE 0.65 01/01/2022   GLUCOSE  101 (H) 01/01/2022   GFRNONAA >60 01/01/2022   GFRAA >60 11/14/2018   CALCIUM 9.6 01/01/2022   PHOS 2.3 (L) 12/28/2021   PROT 6.1 (L) 12/30/2021   ALBUMIN 2.8 (L) 12/30/2021   BILITOT 0.5 12/30/2021   ALKPHOS 55 12/30/2021   AST 11 (L) 12/30/2021   ALT 11 12/30/2021   ANIONGAP 8 01/01/2022    CBG (last 3)  Recent Labs    12/31/21 2118 01/01/22 0602 01/01/22 1124  GLUCAP 246* 104* 131*      Coagulation Profile: Recent Labs  Lab 12/26/21 1435  INR 1.3*     Radiology Studies: I have personally reviewed the imaging studies  No results found.     Estill Cotta M.D. [facility] 01/01/2022, 12:31 PM  Available via Epic secure chat 7am-7pm After 7 pm, please refer to night coverage provider listed on amion.

## 2022-01-02 DIAGNOSIS — I63321 Cerebral infarction due to thrombosis of right anterior cerebral artery: Secondary | ICD-10-CM | POA: Diagnosis not present

## 2022-01-02 DIAGNOSIS — I10 Essential (primary) hypertension: Secondary | ICD-10-CM | POA: Diagnosis not present

## 2022-01-02 DIAGNOSIS — I639 Cerebral infarction, unspecified: Secondary | ICD-10-CM | POA: Diagnosis not present

## 2022-01-02 DIAGNOSIS — E785 Hyperlipidemia, unspecified: Secondary | ICD-10-CM | POA: Diagnosis not present

## 2022-01-02 LAB — GLUCOSE, CAPILLARY
Glucose-Capillary: 136 mg/dL — ABNORMAL HIGH (ref 70–99)
Glucose-Capillary: 72 mg/dL (ref 70–99)
Glucose-Capillary: 162 mg/dL — ABNORMAL HIGH (ref 70–99)
Glucose-Capillary: 357 mg/dL — ABNORMAL HIGH (ref 70–99)

## 2022-01-02 LAB — BASIC METABOLIC PANEL
Anion gap: 7 (ref 5–15)
BUN: 16 mg/dL (ref 8–23)
CO2: 25 mmol/L (ref 22–32)
Calcium: 9.4 mg/dL (ref 8.9–10.3)
Chloride: 103 mmol/L (ref 98–111)
Creatinine, Ser: 0.69 mg/dL (ref 0.44–1.00)
GFR, Estimated: >60 mL/min (ref >60)
Glucose, Bld: 138 mg/dL — ABNORMAL HIGH (ref 70–99)
Potassium: 3.9 mmol/L (ref 3.5–5.1)
Sodium: 135 mmol/L (ref 135–145)

## 2022-01-02 LAB — MAGNESIUM: Magnesium: 1.5 mg/dL — ABNORMAL LOW (ref 1.7–2.4)

## 2022-01-02 MED ORDER — MAGNESIUM SULFATE 4 GM/100ML IV SOLN
4.0000 g | Freq: Once | INTRAVENOUS | Status: AC
  Administered 2022-01-02: 4 g via INTRAVENOUS
  Filled 2022-01-02: qty 100

## 2022-01-02 MED ORDER — MAGNESIUM SULFATE 50 % IJ SOLN: 4.0000 g | Freq: Once | INTRAVENOUS | Status: DC

## 2022-01-02 MED ORDER — INSULIN ASPART 100 UNIT/ML IJ SOLN
0.0000 [IU] | Freq: Three times a day (TID) | INTRAMUSCULAR | Status: DC
  Administered 2022-01-03: 2 [IU] via SUBCUTANEOUS
  Administered 2022-01-03 – 2022-01-04 (×2): 3 [IU] via SUBCUTANEOUS

## 2022-01-02 MED ORDER — INSULIN ASPART 100 UNIT/ML IJ SOLN
0.0000 [IU] | Freq: Every day | INTRAMUSCULAR | Status: DC
  Administered 2022-01-02: 5 [IU] via SUBCUTANEOUS
  Administered 2022-01-03: 2 [IU] via SUBCUTANEOUS

## 2022-01-02 MED ORDER — MAGNESIUM OXIDE -MG SUPPLEMENT 400 (240 MG) MG PO TABS
400.0000 mg | ORAL_TABLET | Freq: Every day | ORAL | Status: DC
  Administered 2022-01-02 – 2022-01-04 (×3): 400 mg via ORAL
  Filled 2022-01-02 (×3): qty 1

## 2022-01-02 NOTE — Plan of Care (Signed)
  Problem: Education: Goal: Ability to describe self-care measures that may prevent or decrease complications (Diabetes Survival Skills Education) will improve Outcome: Progressing Goal: Individualized Educational Video(s) Outcome: Progressing   

## 2022-01-02 NOTE — Progress Notes (Signed)
[facility]                                                                              Wanda [name], is a 81 y.o. female, DOB - [birth year], FKC:[id number] Admit date - 12/26/2021    Outpatient Primary MD for the patient is Copland, Gay Filler, MD  LOS - 7  days  Chief Complaint  Patient presents with   Extremity Weakness       Brief summary   Patient is a 81 y.o. female with past medical history significant for HTN, HLD, recent DVT on Eliquis, recent CVA July 2023 on Plavix who presented to [facility] ED on 12/18 from [place] complaining of left arm and leg weakness.  Onset of symptoms Thursday 12/22/21.  On the day of arrival to the ED, patient was also noted to have a left-sided facial droop with slurred speech.   In the ED, temperature 98.2 F, HR 107, RR 17, BP 162/78, SpO2 96% on room air.  Sodium 137, potassium 3.5, chloride 102, CO2 21, glucose 113, BUN 14, creatinine 0.78.  WBC 12.8, hemoglobin 14.7, platelets 274.  INR 1.3.  EtOH level less than 10.  CT head without contrast with new interval infarct posterior right frontal lobe.  MR brain and MRA head/neck with acute infarcts right MCA territory with the largest area of infarction posterior right frontal lobe with additional involvement of the right insula, anterior right frontal lobe and right parietal lobe, severe stenosis origin in the right ICA.  Neurology was consulted.  TRH consulted for admission for further evaluation and management of acute CVA.  Awaiting SNF   Assessment & Plan    Principal Problem: Right MCA CVA, acute Symptomatic severe right ICA stenosis with near occlusion -Presented to ED with left-sided weakness, facial droop and slurred speech.  -MRI brain, MRA head and neck showed acute infarcts right MCA territory with the largest area of infarction posterior right frontal lobe with additional involvement of the right insula, anterior right frontal lobe and right parietal lobe, severe  stenosis origin in the right ICA.  -Neurology consulted - Carotid Doppler with 1-39% stenosis of left ICA, 80-99 stenosis of right ICA.  -2D echo showed EF > 75%, mild basal septal LVH, G1 DD, normal left atrial size, normal atrial septum -LDL 120, hemoglobin A1c 10.4 -Neuro IR was consulted for potential stenting of right ICA; but family and patient decided not to proceed with angiogram and stenting at this time as they are aware of risks to include decreased quality of life, additional strokes and possibly death.  -PT recommended SNF, passed SLP evaluation neurology now signed off.  Seen by speech therapy and passed swallow evaluation.  Currently on carb modified diet -- heparin drip transitioned to Xarelto per daughter's request on 12/20 -Awaiting SNF, no acute issues   Nausea/vomiting: -On 12/21, has resolved  Acute metabolic encephalopathy Etiology likely secondary to large CVA, right frontal lobe.  Low suspicion for seizure.  -UA negative for UTI   Essential hypertension -Continue amlodipine, BP stable - Home medications include amlodipine 5 mg p.o. daily, clonidine 0.1 mg p.o. twice daily.  Hyperlipidemia LDL 120.  Intolerant to statins. Continue Zetia '10mg'$  daily   Thyroid nodules Incidental finding on carotid ultrasound.   Hypomagnesemia Mag 1.5, will give IV magnesium 4 g x1 Has been persistently low hence placing on magnesium oxide 400 mg daily  Hypokalemia K 3.9   Type 2 diabetes mellitus, poorly controlled with hyperglycemia, IDDM Home regimen includes insulin glargine 30 units daily, insulin aspart 3 units 3 times daily AC.   -Hemoglobin A1c 10.4, poorly controlled  -Continue Semglee 30 units daily, sliding scale insulin moderate    Recent DVT on anticoagulation On Eliquis outpatient. -- Transitioned heparin drip to Xarelto 12/20 per daughter's request   Anxiety/depression: On venlafaxine outpatient.   Pressure injury documentation Left buttock stage  II  Code Status: DNR DVT Prophylaxis:   rivaroxaban (XARELTO) tablet 20 mg   Level of Care: Level of care: Telemetry Medical Family Communication: No family at the bedside Disposition Plan:      Remains inpatient appropriate: Stable, no acute issues, awaiting SNF   Procedures:  2D echo  Consultants:   Neurology, neurointerventional radiology  Antimicrobials:   Medications  amLODipine  5 mg Oral Daily   ezetimibe  10 mg Oral Daily   insulin aspart  0-15 Units Subcutaneous TID WC   insulin glargine-yfgn  30 Units Subcutaneous Daily   magnesium oxide  400 mg Oral Daily   rivaroxaban  20 mg Oral Q supper   venlafaxine XR  75 mg Oral Q breakfast      Subjective:   Wanda [name] was seen and examined today.  No acute issues overnight, BP controlled.  Awaiting SNF.  No pain.  Has left-sided weakness.  Objective:   Vitals:   01/02/22 0013 01/02/22 0338 01/02/22 0437 01/02/22 0720  BP: (!) 137/48 (!) 130/58  123/67  Pulse:  80  97  Resp:  16    Temp:  97.7 F (36.5 C)  97.7 F (36.5 C)  TempSrc:  Oral  Oral  SpO2:  95%  97%  Weight:   63 kg   Height:        Intake/Output Summary (Last 24 hours) at 01/02/2022 0853 Last data filed at 01/02/2022 0437 Gross per 24 hour  Intake --  Output 550 ml  Net -550 ml     Wt Readings from Last 3 Encounters:  01/02/22 63 kg  12/02/21 67.6 kg  07/24/21 78 kg   Physical Exam General: Sleepy but easily arousable, NAD Cardiovascular: S1 S2 clear, RRR.  Respiratory: CTAB, no wheezing, rales or rhonchi Gastrointestinal: Soft, nontender, nondistended, NBS Ext: no pedal edema bilaterally Neuro: left-sided weakness   Data Reviewed:  I have personally reviewed following labs    CBC Lab Results  Component Value Date   WBC 7.7 12/30/2021   RBC 3.36 (L) 12/30/2021   HGB 10.6 (L) 12/30/2021   HCT 30.1 (L) 12/30/2021   MCV 89.6 12/30/2021   MCH 31.5 12/30/2021   PLT 311 12/30/2021   MCHC 35.2 12/30/2021   RDW 14.0  12/30/2021   LYMPHSABS 0.7 12/28/2021   MONOABS 0.9 12/28/2021   EOSABS 0.0 12/28/2021   BASOSABS 0.0 46/50/3546     Last metabolic panel Lab Results  Component Value Date   NA 135 01/02/2022   K 3.9 01/02/2022   CL 103 01/02/2022   CO2 25 01/02/2022   BUN 16 01/02/2022   CREATININE 0.69 01/02/2022   GLUCOSE 138 (H) 01/02/2022   GFRNONAA >60 01/02/2022   GFRAA >60 11/14/2018   CALCIUM  9.4 01/02/2022   PHOS 2.3 (L) 12/28/2021   PROT 6.1 (L) 12/30/2021   ALBUMIN 2.8 (L) 12/30/2021   BILITOT 0.5 12/30/2021   ALKPHOS 55 12/30/2021   AST 11 (L) 12/30/2021   ALT 11 12/30/2021   ANIONGAP 7 01/02/2022    CBG (last 3)  Recent Labs    01/01/22 1705 01/01/22 2053 01/02/22 0627  GLUCAP 120* 195* 136*      Coagulation Profile: Recent Labs  Lab 12/26/21 1435  INR 1.3*     Radiology Studies: I have personally reviewed the imaging studies  No results found.     Estill Cotta M.D. [facility] 01/02/2022, 8:53 AM  Available via Epic secure chat 7am-7pm After 7 pm, please refer to night coverage provider listed on amion.

## 2022-01-03 DIAGNOSIS — E785 Hyperlipidemia, unspecified: Secondary | ICD-10-CM | POA: Diagnosis not present

## 2022-01-03 DIAGNOSIS — I639 Cerebral infarction, unspecified: Secondary | ICD-10-CM | POA: Diagnosis not present

## 2022-01-03 DIAGNOSIS — I63321 Cerebral infarction due to thrombosis of right anterior cerebral artery: Secondary | ICD-10-CM | POA: Diagnosis not present

## 2022-01-03 DIAGNOSIS — E43 Unspecified severe protein-calorie malnutrition: Secondary | ICD-10-CM | POA: Insufficient documentation

## 2022-01-03 DIAGNOSIS — I10 Essential (primary) hypertension: Secondary | ICD-10-CM | POA: Diagnosis not present

## 2022-01-03 LAB — GLUCOSE, CAPILLARY
Glucose-Capillary: 148 mg/dL — ABNORMAL HIGH (ref 70–99)
Glucose-Capillary: 146 mg/dL — ABNORMAL HIGH (ref 70–99)
Glucose-Capillary: 200 mg/dL — ABNORMAL HIGH (ref 70–99)
Glucose-Capillary: 210 mg/dL — ABNORMAL HIGH (ref 70–99)

## 2022-01-03 MED ORDER — SENNOSIDES-DOCUSATE SODIUM 8.6-50 MG PO TABS
2.0000 | ORAL_TABLET | Freq: Every day | ORAL | Status: DC
  Administered 2022-01-03: 2 via ORAL
  Filled 2022-01-03: qty 2

## 2022-01-03 MED ORDER — GLUCERNA SHAKE PO LIQD
237.0000 mL | Freq: Three times a day (TID) | ORAL | Status: DC
  Administered 2022-01-03 – 2022-01-04 (×3): 237 mL via ORAL

## 2022-01-03 MED ORDER — POLYETHYLENE GLYCOL 3350 17 G PO PACK
17.0000 g | PACK | Freq: Every day | ORAL | Status: DC
  Administered 2022-01-03 – 2022-01-04 (×2): 17 g via ORAL
  Filled 2022-01-03 (×2): qty 1

## 2022-01-03 MED ORDER — INSULIN GLARGINE-YFGN 100 UNIT/ML ~~LOC~~ SOLN
33.0000 [IU] | Freq: Every day | SUBCUTANEOUS | Status: DC
  Administered 2022-01-03 – 2022-01-04 (×2): 33 [IU] via SUBCUTANEOUS
  Filled 2022-01-03 (×2): qty 0.33

## 2022-01-03 MED ORDER — ADULT MULTIVITAMIN W/MINERALS CH
1.0000 | ORAL_TABLET | Freq: Every day | ORAL | Status: DC
  Administered 2022-01-03 – 2022-01-04 (×2): 1 via ORAL
  Filled 2022-01-03 (×2): qty 1

## 2022-01-03 MED ORDER — INSULIN ASPART 100 UNIT/ML IJ SOLN
3.0000 [IU] | Freq: Three times a day (TID) | INTRAMUSCULAR | Status: DC
  Administered 2022-01-03 – 2022-01-04 (×2): 3 [IU] via SUBCUTANEOUS

## 2022-01-03 NOTE — Progress Notes (Signed)
Physical Therapy Treatment Patient Details Name: Wanda [name] MRN: [id number] DOB: 07-Sep-1940 Today's Date: 01/03/2022   History of Present Illness 81 y.o. female who presented for evaluation of worsening left-sided weakness. MRI brain: acute infarcts in the right MCA territory, with the largest area of infarction in the posterior right frontal lobe, but with additional involvement of the right insula, anterior right frontal lobe, and right parietal lobe. PMH: recent diagnosed DVT on Eliquis, recent stroke in July 2023 on Plavix, HTN, HLD.    PT Comments    Patient sleeping on arrival, but arouses with name called. Once sitting on EOB, she maintained eyes open through remainder of session. Continues with left hemiplegia and left inattention (although she will turn head past midline to the left, her eyes do not cross midline). Progressed from mod assist sitting balance to minguard assist with multimodal cues and practice. Attempted sit to stand x1 with +2 assist and only achieved ~3/4 standing.     Recommendations for follow up therapy are one component of a multi-disciplinary discharge planning process, led by the attending physician.  Recommendations may be updated based on patient status, additional functional criteria and insurance authorization.  Follow Up Recommendations  Skilled nursing-short term rehab (<3 hours/day) Can patient physically be transported by private vehicle: No   Assistance Recommended at Discharge Frequent or constant Supervision/Assistance  Patient can return home with the following Two people to help with walking and/or transfers;Assist for transportation;Help with stairs or ramp for entrance;Assistance with cooking/housework   Equipment Recommendations  Other (comment) (defer to post acute)    Recommendations for Other Services       Precautions / Restrictions Precautions Precautions: Fall Restrictions Weight Bearing Restrictions: No     Mobility   Bed Mobility Overal bed mobility: Needs Assistance Bed Mobility: Supine to Sit, Sit to Supine     Supine to sit: Total assist Sit to supine: Total assist   General bed mobility comments: Pt sleepy but arousable. HOB already elevated and assisted to pivot wiht bed pad to sitting EOB. Pt made no effort to assist    Transfers Overall transfer level: Needs assistance Equipment used: 2 person hand held assist Transfers: Sit to/from Stand Sit to Stand: Max assist, +2 physical assistance           General transfer comment: with left knee blocked achieved ~3/4 standing and pt then began moving Rt foot forward with subsequent posterior LOB and returned to sitting    Ambulation/Gait               General Gait Details: unable   Stairs             Wheelchair Mobility    Modified Rankin (Stroke Patients Only) Modified Rankin (Stroke Patients Only) Pre-Morbid Rankin Score: Moderate disability Modified Rankin: Severe disability     Balance Overall balance assessment: Needs assistance Sitting-balance support: Feet supported, Single extremity supported Sitting balance-Leahy Scale: Poor Sitting balance - Comments: L lateral lean (mod assist) that improved significantly with multi modal cueing and R hand placement (progressed to minguard). Postural control: Left lateral lean, Posterior lean                                  Cognition Arousal/Alertness: Lethargic Behavior During Therapy: Flat affect Overall Cognitive Status: Impaired/Different from baseline Area of Impairment: Orientation, Attention, Following commands, Safety/judgement, Awareness, Problem solving  Orientation Level: Disoriented to, Time, Place, Situation Current Attention Level: Sustained   Following Commands: Follows one step commands with increased time Safety/Judgement: Decreased awareness of safety, Decreased awareness of deficits Awareness:  Intellectual Problem Solving: Slow processing, Decreased initiation, Difficulty sequencing, Requires verbal cues, Requires tactile cues          Exercises General Exercises - Lower Extremity Ankle Circles/Pumps: PROM, Left, 5 reps    General Comments General comments (skin integrity, edema, etc.): no active movement noted in LUE or LLE      Pertinent Vitals/Pain Pain Assessment Pain Assessment: No/denies pain    Home Living                          Prior Function            PT Goals (current goals can now be found in the care plan section) Acute Rehab PT Goals Patient Stated Goal: None stated Time For Goal Achievement: 01/11/22 Potential to Achieve Goals: Fair Progress towards PT goals: Progressing toward goals    Frequency    Min 3X/week      PT Plan Current plan remains appropriate    Co-evaluation              AM-PAC PT "6 Clicks" Mobility   Outcome Measure  Help needed turning from your back to your side while in a flat bed without using bedrails?: A Lot Help needed moving from lying on your back to sitting on the side of a flat bed without using bedrails?: Total Help needed moving to and from a bed to a chair (including a wheelchair)?: Total Help needed standing up from a chair using your arms (e.g., wheelchair or bedside chair)?: Total Help needed to walk in hospital room?: Total Help needed climbing 3-5 steps with a railing? : Total 6 Click Score: 7    End of Session Equipment Utilized During Treatment: Gait belt Activity Tolerance: Patient tolerated treatment well Patient left: in bed;with bed alarm set;with call bell/phone within reach Nurse Communication: Mobility status PT Visit Diagnosis: Other abnormalities of gait and mobility (R26.89);Muscle weakness (generalized) (M62.81)     Time: 6438-3818 PT Time Calculation (min) (ACUTE ONLY): 23 min  Charges:  $Neuromuscular Re-education: 23-37 mins                      Arby Barrette, PT Acute Rehabilitation Services  Office [phone]    Rexanne Mano 01/03/2022, 2:45 PM

## 2022-01-03 NOTE — Progress Notes (Signed)
[facility]                                                                              Wanda [name], is a 81 y.o. female, DOB - [birth year], EQA:[id number] Admit date - 12/26/2021    Outpatient Primary MD for the patient is Copland, Gay Filler, MD  LOS - 8  days  Chief Complaint  Patient presents with   Extremity Weakness       Brief summary   Patient is a 81 y.o. female with past medical history significant for HTN, HLD, recent DVT on Eliquis, recent CVA July 2023 on Plavix who presented to [facility] ED on 12/18 from [facility] complaining of left arm and leg weakness.  Onset of symptoms Thursday 12/22/21.  On the day of arrival to the ED, patient was also noted to have a left-sided facial droop with slurred speech.   In the ED, temperature 98.2 F, HR 107, RR 17, BP 162/78, SpO2 96% on room air.  Sodium 137, potassium 3.5, chloride 102, CO2 21, glucose 113, BUN 14, creatinine 0.78.  WBC 12.8, hemoglobin 14.7, platelets 274.  INR 1.3.  EtOH level less than 10.  CT head without contrast with new interval infarct posterior right frontal lobe.  MR brain and MRA head/neck with acute infarcts right MCA territory with the largest area of infarction posterior right frontal lobe with additional involvement of the right insula, anterior right frontal lobe and right parietal lobe, severe stenosis origin in the right ICA.  Neurology was consulted.  TRH consulted for admission for further evaluation and management of acute CVA.  Awaiting SNF   Assessment & Plan    Principal Problem: Right MCA CVA, acute Symptomatic severe right ICA stenosis with near occlusion -Presented to ED with left-sided weakness, facial droop and slurred speech.  -MRI brain, MRA head and neck showed acute infarcts right MCA territory with the largest area of infarction posterior right frontal lobe with additional involvement of the right insula, anterior right frontal lobe and right parietal lobe, severe  stenosis origin in the right ICA.  -Neurology was consulted, underwent stroke workup - Carotid Doppler with 1-39% stenosis of left ICA, 80-99 stenosis of right ICA.  -2D echo showed EF > 75%, mild basal septal LVH, G1 DD, normal left atrial size, normal atrial septum -LDL 120, hemoglobin A1c 10.4 -Neuro IR was consulted for potential stenting of right ICA; but family and patient decided not to proceed with angiogram and stenting at this time as they are aware of risks to include decreased quality of life, additional strokes and possibly death.  -PT recommended SNF, tolerating carb modified diet  -- heparin drip transitioned to Xarelto per daughter's request on 12/20 -No acute issue, awaiting SNF   Nausea/vomiting: -On 12/21, has resolved, tolerating diet  Acute metabolic encephalopathy Etiology likely secondary to large CVA, right frontal lobe.  Low suspicion for seizure.  -UA negative for UTI   Essential hypertension -BP stable, continue amlodipine - Home medications include amlodipine 5 mg p.o. daily, clonidine 0.1 mg p.o. twice daily.   Hyperlipidemia LDL 120.  Intolerant to statins. Continue Zetia '10mg'$  daily  Thyroid nodules Incidental finding on carotid ultrasound.   Hypomagnesemia Mag 1.5, will give IV magnesium 4 g x1 Has been persistently low hence placing on magnesium oxide 400 mg daily  Hypokalemia K 3.9   Type 2 diabetes mellitus, poorly controlled with hyperglycemia, IDDM Home regimen includes insulin glargine 30 units daily, insulin aspart 3 units 3 times daily AC.   -Hemoglobin A1c 10.4, poorly controlled  -Elevated CBG reading overnight, increased Semglee to 33 units daily, added NovoLog 3 units 3 times daily AC, continue SSI  Recent Labs    01/01/22 2053 01/02/22 0627 01/02/22 1245 01/02/22 1704 01/02/22 2053 01/03/22 0615  GLUCAP 195* 136* 72 162* 357* 148*      Recent DVT on anticoagulation On Eliquis outpatient. -- Transitioned heparin drip to  Xarelto 12/20 per daughter's request   Anxiety/depression: On venlafaxine outpatient.   Pressure injury documentation Left buttock stage II  Code Status: DNR DVT Prophylaxis:   rivaroxaban (XARELTO) tablet 20 mg   Level of Care: Level of care: Telemetry Medical Family Communication: No family at the bedside Disposition Plan:      Remains inpatient appropriate: Stable, no acute issues, awaiting SNF   Procedures:  2D echo  Consultants:   Neurology, neurointerventional radiology  Antimicrobials:   Medications  amLODipine  5 mg Oral Daily   ezetimibe  10 mg Oral Daily   insulin aspart  0-15 Units Subcutaneous TID WC   insulin aspart  0-5 Units Subcutaneous QHS   insulin aspart  3 Units Subcutaneous TID WC   insulin glargine-yfgn  33 Units Subcutaneous Daily   magnesium oxide  400 mg Oral Daily   rivaroxaban  20 mg Oral Q supper   venlafaxine XR  75 mg Oral Q breakfast      Subjective:   Wanda [name] was seen and examined today.  Sleepy but no acute issues overnight.  BP stable, awaiting SNF   Vitals:   01/02/22 2000 01/02/22 2333 01/03/22 0344 01/03/22 0716  BP: 126/65 (!) 106/53 125/82 (!) 151/60  Pulse: 90 99 85 80  Resp:  '18 18 16  '$ Temp: 97.9 F (36.6 C) 98.4 F (36.9 C) 98.7 F (37.1 C) 97.7 F (36.5 C)  TempSrc: Axillary Axillary Axillary Oral  SpO2: 96% 96% 97% 95%  Weight:      Height:        Intake/Output Summary (Last 24 hours) at 01/03/2022 1121 Last data filed at 01/03/2022 0347 Gross per 24 hour  Intake --  Output 350 ml  Net -350 ml     Wt Readings from Last 3 Encounters:  01/02/22 63 kg  12/02/21 67.6 kg  07/24/21 78 kg    Physical Exam General: Sleepy but easily arousable and responds to verbal commands and questions Cardiovascular: S1 S2 clear, RRR.  Respiratory: CTAB, no wheezing, rales Gastrointestinal: Soft, nontender, nondistended, NBS Ext: no pedal edema bilaterally Neuro: left-sided weakness   Data Reviewed:  I  have personally reviewed following labs    CBC Lab Results  Component Value Date   WBC 7.7 12/30/2021   RBC 3.36 (L) 12/30/2021   HGB 10.6 (L) 12/30/2021   HCT 30.1 (L) 12/30/2021   MCV 89.6 12/30/2021   MCH 31.5 12/30/2021   PLT 311 12/30/2021   MCHC 35.2 12/30/2021   RDW 14.0 12/30/2021   LYMPHSABS 0.7 12/28/2021   MONOABS 0.9 12/28/2021   EOSABS 0.0 12/28/2021   BASOSABS 0.0 40/98/1191     Last metabolic panel Lab Results  Component Value Date  NA 135 01/02/2022   K 3.9 01/02/2022   CL 103 01/02/2022   CO2 25 01/02/2022   BUN 16 01/02/2022   CREATININE 0.69 01/02/2022   GLUCOSE 138 (H) 01/02/2022   GFRNONAA >60 01/02/2022   GFRAA >60 11/14/2018   CALCIUM 9.4 01/02/2022   PHOS 2.3 (L) 12/28/2021   PROT 6.1 (L) 12/30/2021   ALBUMIN 2.8 (L) 12/30/2021   BILITOT 0.5 12/30/2021   ALKPHOS 55 12/30/2021   AST 11 (L) 12/30/2021   ALT 11 12/30/2021   ANIONGAP 7 01/02/2022    CBG (last 3)  Recent Labs    01/02/22 1704 01/02/22 2053 01/03/22 0615  GLUCAP 162* 357* 148*      Coagulation Profile: No results for input(s): "INR", "PROTIME" in the last 168 hours.    Radiology Studies: I have personally reviewed the imaging studies  No results found.     Estill Cotta M.D. [facility] 01/03/2022, 11:21 AM  Available via Epic secure chat 7am-7pm After 7 pm, please refer to night coverage provider listed on amion.

## 2022-01-03 NOTE — TOC Progression Note (Addendum)
Transition of Care [facility]) - Progression Note    Patient Details  Name: Wanda [name] MRN: [id number] Date of Birth: 1940-05-15  Transition of Care [facility]) CM/SW Contact  Jinger Neighbors, [place] Phone Number: 01/03/2022, 10:21 AM  Clinical Narrative:     CSW messaged accepted facilities to [place], pt's dtr.  Pt's daughter chose [facility], facility of choice. CSW left a message for [place], asking for a returned call.   Expected Discharge Plan: [place] Barriers to Discharge: Continued Medical Work up  Expected Discharge Plan and Services     Post Acute Care Choice: [place] Living arrangements for the past 2 months: [place]                                       Social Determinants of Health (SDOH) Interventions SDOH Screenings   Food Insecurity: No Food Insecurity (03/18/2020)  Housing: Low Risk  (03/18/2020)  Transportation Needs: No Transportation Needs (03/18/2020)  Alcohol Screen: Low Risk  (03/18/2020)  Depression (PHQ2-9): Low Risk  (03/18/2020)  Financial Resource Strain: Low Risk  (03/18/2020)  Physical Activity: Inactive (03/18/2020)  Social Connections: Socially Isolated (03/18/2020)  Stress: No Stress Concern Present (03/18/2020)  Tobacco Use: Low Risk  (12/26/2021)    Readmission Risk Interventions     No data to display

## 2022-01-03 NOTE — Progress Notes (Addendum)
Initial Nutrition Assessment  DOCUMENTATION CODES:   Severe malnutrition in context of acute illness/injury  INTERVENTION:  - Add Glucerna Shake po TID, each supplement provides 220 kcal and 10 grams of protein   - Liberalize to Reg diet.   - Add MVI q day.   - Consider Bowel Regimen.   NUTRITION DIAGNOSIS:   Severe Malnutrition related to acute illness as evidenced by percent weight loss, energy intake < or equal to 50% for > or equal to 5 days.  GOAL:   Patient will meet greater than or equal to 90% of their needs  MONITOR:   PO intake, Supplement acceptance  REASON FOR ASSESSMENT:   LOS, Malnutrition Screening Tool    ASSESSMENT:   81 y.o. female admits related to worsening left-sided weakness. PMH includes: HTN, HLD, T2DM, stroke. Pt is currently receiving medical management for right MCA CVA.  Meds reviewed: sliding scale insulin, semglee (33 units), mag-ox. Labs reviewed: Mag low.   Pt resting at time of assessment. Pt is currently oriented x1. Per record, pt has experienced a 6.8% wt loss in 1 month. Pt has been eating less than 50% of her meals since admission. RD will add Glucerna with meals. Will continue to monitor PO intakes. Last BM was 12/21, will check in with MD about bowel regimen.   Pt pending discharge to SNF.   NUTRITION - FOCUSED PHYSICAL EXAM:  Unable to assess due to orientation level.   Diet Order:   Diet Order             Diet regular Room service appropriate? Yes with Assist; Fluid consistency: Thin  Diet effective now                   EDUCATION NEEDS:   Not appropriate for education at this time  Skin:  Skin Assessment: Skin Integrity Issues: Skin Integrity Issues:: Stage II Stage II: left buttocks  Last BM:  12/29/21  Height:   Ht Readings from Last 1 Encounters:  12/26/21 '5\' 1"'$  (1.549 m)    Weight:   Wt Readings from Last 1 Encounters:  01/02/22 63 kg    Ideal Body Weight:     BMI:  Body mass index is  26.24 kg/m.  Estimated Nutritional Needs:   Kcal:  1575-1890 kcals  Protein:  80-95 gm  Fluid:  >/= 1.5 L  Thalia Bloodgood, RD, LDN, CNSC.

## 2022-01-03 NOTE — Plan of Care (Signed)
  Problem: Education: Goal: Ability to describe self-care measures that may prevent or decrease complications (Diabetes Survival Skills Education) will improve 01/03/2022 1544 by Monica Becton, RN Outcome: Progressing 01/03/2022 1544 by Monica Becton, RN Outcome: Progressing Goal: Individualized Educational Video(s) 01/03/2022 1544 by Monica Becton, RN Outcome: Progressing 01/03/2022 1544 by Monica Becton, RN Outcome: Progressing

## 2022-01-04 DIAGNOSIS — E119 Type 2 diabetes mellitus without complications: Secondary | ICD-10-CM | POA: Diagnosis not present

## 2022-01-04 DIAGNOSIS — Z7401 Bed confinement status: Secondary | ICD-10-CM | POA: Diagnosis not present

## 2022-01-04 DIAGNOSIS — I10 Essential (primary) hypertension: Secondary | ICD-10-CM | POA: Diagnosis not present

## 2022-01-04 DIAGNOSIS — I63321 Cerebral infarction due to thrombosis of right anterior cerebral artery: Secondary | ICD-10-CM | POA: Diagnosis not present

## 2022-01-04 DIAGNOSIS — E43 Unspecified severe protein-calorie malnutrition: Secondary | ICD-10-CM | POA: Diagnosis not present

## 2022-01-04 DIAGNOSIS — R531 Weakness: Secondary | ICD-10-CM | POA: Diagnosis not present

## 2022-01-04 DIAGNOSIS — I63311 Cerebral infarction due to thrombosis of right middle cerebral artery: Secondary | ICD-10-CM | POA: Diagnosis not present

## 2022-01-04 DIAGNOSIS — E042 Nontoxic multinodular goiter: Secondary | ICD-10-CM | POA: Diagnosis not present

## 2022-01-04 DIAGNOSIS — Z794 Long term (current) use of insulin: Secondary | ICD-10-CM | POA: Diagnosis not present

## 2022-01-04 DIAGNOSIS — I639 Cerebral infarction, unspecified: Secondary | ICD-10-CM | POA: Diagnosis not present

## 2022-01-04 DIAGNOSIS — I6521 Occlusion and stenosis of right carotid artery: Secondary | ICD-10-CM | POA: Diagnosis not present

## 2022-01-04 DIAGNOSIS — E785 Hyperlipidemia, unspecified: Secondary | ICD-10-CM | POA: Diagnosis not present

## 2022-01-04 DIAGNOSIS — E876 Hypokalemia: Secondary | ICD-10-CM | POA: Diagnosis not present

## 2022-01-04 LAB — GLUCOSE, CAPILLARY
Glucose-Capillary: 165 mg/dL — ABNORMAL HIGH (ref 70–99)
Glucose-Capillary: 56 mg/dL — ABNORMAL LOW (ref 70–99)
Glucose-Capillary: 99 mg/dL (ref 70–99)
Glucose-Capillary: 111 mg/dL — ABNORMAL HIGH (ref 70–99)
Glucose-Capillary: 65 mg/dL — ABNORMAL LOW (ref 70–99)

## 2022-01-04 MED ORDER — SENNOSIDES-DOCUSATE SODIUM 8.6-50 MG PO TABS: 2.0000 | ORAL_TABLET | Freq: Every day | ORAL | Status: DC

## 2022-01-04 MED ORDER — VENLAFAXINE HCL ER 75 MG PO CP24: 75.0000 mg | ORAL_CAPSULE | Freq: Every day | ORAL | 0 refills | Status: DC

## 2022-01-04 MED ORDER — INSULIN GLARGINE-YFGN 100 UNIT/ML ~~LOC~~ SOLN: 33.0000 [IU] | Freq: Every day | SUBCUTANEOUS | 11 refills | Status: DC

## 2022-01-04 MED ORDER — EZETIMIBE 10 MG PO TABS: 10.0000 mg | ORAL_TABLET | Freq: Every day | ORAL | Status: DC

## 2022-01-04 MED ORDER — MAGNESIUM OXIDE -MG SUPPLEMENT 400 (240 MG) MG PO TABS: 400.0000 mg | ORAL_TABLET | Freq: Every day | ORAL | Status: DC

## 2022-01-04 MED ORDER — POLYETHYLENE GLYCOL 3350 17 G PO PACK: 17.0000 g | PACK | Freq: Every day | ORAL | 0 refills | Status: DC | PRN

## 2022-01-04 MED ORDER — RIVAROXABAN 20 MG PO TABS: 20.0000 mg | ORAL_TABLET | Freq: Every day | ORAL | Status: DC

## 2022-01-04 MED ORDER — METOPROLOL TARTRATE 12.5 MG HALF TABLET
12.5000 mg | ORAL_TABLET | Freq: Two times a day (BID) | ORAL | Status: DC
  Administered 2022-01-04: 12.5 mg via ORAL
  Filled 2022-01-04: qty 1

## 2022-01-04 MED ORDER — ADULT MULTIVITAMIN W/MINERALS CH: 1.0000 | ORAL_TABLET | Freq: Every day | ORAL | Status: DC

## 2022-01-04 MED ORDER — METOPROLOL TARTRATE 25 MG PO TABS: 12.5000 mg | ORAL_TABLET | Freq: Two times a day (BID) | ORAL | Status: DC

## 2022-01-04 NOTE — TOC Transition Note (Signed)
Transition of Care [facility]) - CM/SW Discharge Note   Patient Details  Name: Wanda [name] MRN: [id number] Date of Birth: March 22, 1940  [facility]) CM/SW Contact:  Jinger Neighbors, LCSW Phone Number: 01/04/2022, 11:21 AM   Clinical Narrative:     PT going to [place] via [place].  Call to report:[phone] Room Number: 205A  Final next level of care: Skilled Nursing Facility Barriers to Discharge: No Barriers Identified   Patient Goals and CMS Choice CMS Medicare.gov Compare Post Acute Care list provided to:: Patient Represenative (must comment) (Daughter [name]) Choice offered to / list presented to : Adult Children  Discharge Placement                  Patient to be transferred to facility by: [place] Name of family member notified: [name] Patient and family notified of of transfer: 01/04/22  Discharge Plan and Services Additional resources added to the After Visit Summary for       Post Acute Care Choice: [place]                               Social Determinants of Health (SDOH) Interventions SDOH Screenings   Food Insecurity: No Food Insecurity (03/18/2020)  Housing: Low Risk  (03/18/2020)  Transportation Needs: No Transportation Needs (03/18/2020)  Alcohol Screen: Low Risk  (03/18/2020)  Depression (PHQ2-9): Low Risk  (03/18/2020)  Financial Resource Strain: Low Risk  (03/18/2020)  Physical Activity: Inactive (03/18/2020)  Social Connections: Socially Isolated (03/18/2020)  Stress: No Stress Concern Present (03/18/2020)  Tobacco Use: Low Risk  (12/26/2021)     Readmission Risk Interventions     No data to display

## 2022-01-04 NOTE — Discharge Summary (Signed)
Physician Discharge Summary   Patient: Wanda [name] MRN: [id number] DOB: 1940/06/24  Admit date:     12/26/2021  Discharge date: 01/04/22  Discharge Physician: Estill Cotta, MD    PCP: Darreld Mclean, MD   Recommendations at discharge:   Continue Xarelto 20 mg daily with supper  Discharge Diagnoses:  Right MCA CVA, acute Symptomatic severe right ICA stenosis with near occlusion Acute metabolic encephalopathy   Essential hypertension   Hyperlipidemia with target LDL less than 70   DVT (deep venous thrombosis) (HCC)   Protein-calorie malnutrition, severe Thyroid nodule Hypomagnesemia Hypokalemia Diabetes mellitus type 2, poorly controlled with hyperglycemia, IDDM Pressure injury, left buttock stage II  Hospital Course:  Patient is a 81 y.o. female with past medical history significant for HTN, HLD, recent DVT on Eliquis, recent CVA July 2023 on Plavix who presented to [facility] ED on 12/18 from [facility] complaining of left arm and leg weakness.  Onset of symptoms Thursday 12/22/21.  On the day of arrival to the ED, patient was also noted to have a left-sided facial droop with slurred speech.   In the ED, temperature 98.2 F, HR 107, RR 17, BP 162/78, SpO2 96% on room air.  Sodium 137, potassium 3.5, chloride 102, CO2 21, glucose 113, BUN 14, creatinine 0.78.  WBC 12.8, hemoglobin 14.7, platelets 274.  INR 1.3.  EtOH level less than 10.  CT head without contrast with new interval infarct posterior right frontal lobe.  MR brain and MRA head/neck with acute infarcts right MCA territory with the largest area of infarction posterior right frontal lobe with additional involvement of the right insula, anterior right frontal lobe and right parietal lobe, severe stenosis origin in the right ICA.  Neurology was consulted.  TRH consulted for admission for further evaluation and management of acute CVA.   Assessment and Plan:   Right MCA CVA, acute Symptomatic severe right ICA  stenosis with near occlusion -Presented to ED with left-sided weakness, facial droop and slurred speech.  -MRI brain, MRA head and neck showed acute infarcts right MCA territory with the largest area of infarction posterior right frontal lobe with additional involvement of the right insula, anterior right frontal lobe and right parietal lobe, severe stenosis origin in the right ICA.  -Neurology was consulted, underwent stroke workup - Carotid Doppler with 1-39% stenosis of left ICA, 80-99 stenosis of right ICA.  -2D echo showed EF > 75%, mild basal septal LVH, G1 DD, normal left atrial size, normal atrial septum -LDL 120, hemoglobin A1c 10.4 -Neuro IR was consulted for potential stenting of right ICA; but family and patient decided not to proceed with angiogram and stenting at this time as they are aware of risks to include decreased quality of life, additional strokes and possibly death.  -PT recommended SNF, tolerating carb modified diet  -- Eliquis discontinued, placed on Xarelto per daughter's request     Acute metabolic encephalopathy Etiology likely secondary to large CVA, right frontal lobe.  Low suspicion for seizure.  -UA negative for UTI   Essential hypertension -BP stable, continue amlodipine 5 mg daily -Clonidine discontinued, placed on Lopressor 12.5 mg twice daily   Hyperlipidemia LDL 120.  Intolerant to statins. Continue Zetia 32m daily   Thyroid nodules Incidental finding on carotid ultrasound.      Hypokalemia, hypomagnesemia Continue magnesium oral supplementation. Potassium improved   Type 2 diabetes mellitus, poorly controlled with hyperglycemia, IDDM Home regimen includes insulin glargine 30 units daily, insulin aspart 3 units 3  times daily AC and glipizide.   -Hemoglobin A1c 10.4, poorly controlled  -Patient was placed on insulin regimen while inpatient, increased glargine to 33 units daily, continue aspart 3 units 3 times daily AC, glipizide. -Needs  better glycemic control       Recent DVT on anticoagulation On Eliquis outpatient. -- Transitioned heparin drip to Xarelto 12/20 per daughter's request   Anxiety/depression: On venlafaxine outpatient.   Pressure injury documentation Left buttock stage II        Pain control - [place] Controlled Substance Reporting System database was reviewed. and patient was instructed, not to drive, operate heavy machinery, perform activities at heights, swimming or participation in water activities or provide baby-sitting services while on Pain, Sleep and Anxiety Medications; until their outpatient Physician has advised to do so again. Also recommended to not to take more than prescribed Pain, Sleep and Anxiety Medications.  Consultants: Neurology, neurointerventional radiology Procedures performed: 2D echo Disposition: Skilled nursing facility Diet recommendation:  Discharge Diet Orders (From admission, onward)   Carb modified diet   DISCHARGE MEDICATION: Allergies as of 01/04/2022       Reactions   Ace Inhibitors Diarrhea, Swelling, Other (See Comments), Cough   Pt had cough and diarrhea with first few doses of medication; also had swelling of right eyelid. Stopped medication on 03/17/11.   Alendronate Sodium Other (See Comments)   "Made my whole body hurt"   Aspirin Hives   Crestor [rosuvastatin] Other (See Comments)   "Made my whole body hurt"   Lipitor [atorvastatin Calcium] Other (See Comments)   "Made my whole body hurt"   Metformin And Related    Severe abdominal pain   Shellfish Allergy Nausea Only   Intolerant of fresh shellfish, reports the reaction is GI upset, denies hives, denies any swelling  Reports that she can tolerate canned seafood.    Statins Other (See Comments)   myalgia        Medication List     STOP taking these medications    apixaban 5 MG Tabs tablet Commonly known as: ELIQUIS   cloNIDine 0.1 MG tablet Commonly known as: CATAPRES        TAKE these medications    acetaminophen 325 MG tablet Commonly known as: TYLENOL Take 650 mg by mouth in the morning and at bedtime.   amLODipine 5 MG tablet Commonly known as: NORVASC TAKE 1 TABLET(5 MG) BY MOUTH DAILY What changed:  how much to take how to take this when to take this additional instructions   blood glucose meter kit and supplies Dispense based on patient and insurance preference. Pt just needs meter   ezetimibe 10 MG tablet Commonly known as: ZETIA Take 1 tablet (10 mg total) by mouth daily. Start taking on: January 05, 2022   glipiZIDE 5 MG 24 hr tablet Commonly known as: GLUCOTROL XL Take 2 tabs in the morning and 1 tab in the evening. What changed:  how much to take how to take this when to take this additional instructions   glucose blood test strip Commonly known as: ONE TOUCH ULTRA TEST Test blood sugar 3 times a day. Dx code: 250.00   OneTouch Ultra test strip Generic drug: glucose blood Use as instructed   insulin aspart 100 UNIT/ML injection Commonly known as: novoLOG Inject 3 Units into the skin 3 (three) times daily with meals.   insulin glargine-yfgn 100 UNIT/ML injection Commonly known as: SEMGLEE Inject 0.33 mLs (33 Units total) into the skin daily. What changed:  how much to take   Insulin Pen Needle 32G X 4 MM Misc Commonly known as: BD Pen Needle Nano U/F USE TO INJECT INSULIN ONCE DAILY   INSULIN SYRINGE .5CC/31GX5/16" 31G X 5/16" 0.5 ML Misc Use to inject insulin 1 time daily.   Lancets Misc 1 each by Does not apply route 3 (three) times daily. Dx: E11.65   magnesium oxide 400 (240 Mg) MG tablet Commonly known as: MAG-OX Take 1 tablet (400 mg total) by mouth daily. Start taking on: January 05, 2022   metoprolol tartrate 25 MG tablet Commonly known as: LOPRESSOR Take 0.5 tablets (12.5 mg total) by mouth 2 (two) times daily.   multivitamin with minerals Tabs tablet Take 1 tablet by mouth daily. Start taking  on: January 05, 2022   ONE TOUCH ULTRA MINI w/Device Kit Use to test blood sugar daily as instructed. Dx: E11.65   polyethylene glycol 17 g packet Commonly known as: MIRALAX / GLYCOLAX Take 17 g by mouth daily as needed for moderate constipation.   rivaroxaban 20 MG Tabs tablet Commonly known as: XARELTO Take 1 tablet (20 mg total) by mouth daily with supper.   senna-docusate 8.6-50 MG tablet Commonly known as: Senokot-S Take 2 tablets by mouth at bedtime. What changed:  how much to take when to take this reasons to take this   venlafaxine XR 75 MG 24 hr capsule Commonly known as: EFFEXOR-XR Take 1 capsule (75 mg total) by mouth daily with breakfast. Start taking on: January 05, 2022 What changed:  medication strength how much to take additional instructions               Discharge Care Instructions  (From admission, onward)           Start     Ordered   01/04/22 0000  If the dressing is still on your incision site when you go home, remove it on the third day after your surgery date. Remove dressing if it begins to fall off, or if it is dirty or damaged before the third day.        01/04/22 0933   01/04/22 0000  If the dressing is still on your incision site when you go home, remove it on the third day after your surgery date. Remove dressing if it begins to fall off, or if it is dirty or damaged before the third day.        01/04/22 1023            Follow-up Information     Copland, Gay Filler, MD. Schedule an appointment as soon as possible for a visit in 2 week(s).   Specialty: Family Medicine Why: for hospital follow-up Contact information: [address] [place] [address] [phone]         [facility] Follow up in 4 week(s).   Why: for hospital follow-up Contact information: [address]     [address]  [place] [address] [phone]               Discharge  Exam: Danley Danker Weights   12/26/21 1800 01/01/22 0524 01/02/22 0437  Weight: 67 kg 64.9 kg 63 kg   S: No acute issues.  Sleepy but easily arousable and reports that she is okay  BP (!) 142/62 (BP Location: Right Arm)   Pulse (!) 110   Temp 98.2 F (36.8 C) (Oral)   Resp 20   Ht _0  (1.549 m)  Wt 63 kg   LMP  (LMP Unknown)   SpO2 91%   BMI 26.24 kg/m   Physical Exam General: Sleepy but arousable, NAD Cardiovascular: S1 S2 clear, RRR.  Respiratory: CTAB, no wheezing, rales or rhonchi Gastrointestinal: Soft, nontender, nondistended, NBS Ext: no pedal edema bilaterally Neuro: left-sided weakness  Condition at discharge: fair  The results of significant diagnostics from this hospitalization (including imaging, microbiology, ancillary and laboratory) are listed below for reference.   Imaging Studies: DG Abd 1 View  Result Date: 12/29/2021 CLINICAL DATA:  Intractable sickle vomiting with nausea EXAM: ABDOMEN - 1 VIEW COMPARISON:  Radiograph 12/27/2021 FINDINGS: Nasogastric tube is been removed. There is mild distension of the stomach and large bowel. There is no evidence of bowel obstruction. Degenerative changes of the spine. Vascular calcifications. No acute osseous abnormality. IMPRESSION: No evidence of bowel obstruction. Electronically Signed   By: Maurine Simmering M.D.   On: 12/29/2021 13:03   CT HEAD WO CONTRAST (5MM)  Result Date: 12/29/2021 CLINICAL DATA:  Stroke follow-up EXAM: CT HEAD WITHOUT CONTRAST TECHNIQUE: Contiguous axial images were obtained from the base of the skull through the vertex without intravenous contrast. RADIATION DOSE REDUCTION: This exam was performed according to the departmental dose-optimization program which includes automated exposure control, adjustment of the mA and/or kV according to patient size and/or use of iterative reconstruction technique. COMPARISON:  12/26/2021 CT head and MRI head FINDINGS: Brain: Hypodensity in the right MCA territory,  particularly the anterior and posterior right frontal lobe, right insula, and right parietal lobe, which correlates with the acute infarct seen on the 12/26/2021 CT. No evidence of hemorrhagic transformation, mass effect, or midline shift. No evidence of additional acute infarct. No hydrocephalus or extra-axial collection. Vascular: No hyperdense vessel. Atherosclerotic calcifications in the intracranial carotid and vertebral arteries. Skull: Normal. Negative for fracture or focal lesion. Sinuses/Orbits: Mild mucosal thickening in the left maxillary sinus and ethmoid air cells. Status post bilateral lens replacements. Other: The mastoid air cells are well aerated. IMPRESSION: Evolving right MCA territory infarct, without evidence of hemorrhagic transformation, mass effect, or midline shift. Electronically Signed   By: Merilyn Baba M.D.   On: 12/29/2021 03:20   DG Abd 1 View  Result Date: 12/27/2021 CLINICAL DATA:  NG tube placement EXAM: ABDOMEN - 1 VIEW COMPARISON:  CT 11/30/2021 FINDINGS: Esophageal tube tip overlies the proximal to mid stomach. Nonobstructed gas pattern. IMPRESSION: Esophageal tube tip overlies the proximal to mid stomach. Electronically Signed   By: Donavan Foil M.D.   On: 12/27/2021 20:26   VAS US CAROTID  Result Date: 12/27/2021 Carotid Arterial Duplex Study Patient Name:  Carlynn MARIE Goto  Date of Exam:   12/27/2021 Medical Rec #: [id number]            Accession #:    [id number] Date of Birth: June 16, 1940             Patient Gender: F Patient Age:   52 years Exam Location:  [facility] Procedure:      VAS US CAROTID Referring Phys: PRAMOD SETHI --------------------------------------------------------------------------------  Indications:       CVA. Limitations        Today's exam was limited due to patient somnolence, snoring,                    coughing, and grabbing technologist hand/probe. Comparison Study:  Multiple prior imaging studies. MRA Neck 12/26/21  demonstrated severe stenosis at the right ICA origin with                    poor flow signal in the right as compared to left.                    CTA Neck 08/03/21 demonstrated severe right ICA stenosis                    approaching string sign.                    Carotid Duplex 07/26/21 demonstrated flow velocities                    consistent with 40-59% by EDV; however, 60-79% by ICA/CCA                    ratio, possibly higher in the setting of PSV of 383 cm/s. Performing Technologist: Darlin Coco RDMS, RVT  Examination Guidelines: A complete evaluation includes B-mode imaging, spectral Doppler, color Doppler, and power Doppler as needed of all accessible portions of each vessel. Bilateral testing is considered an integral part of a complete examination. Limited examinations for reoccurring indications may be performed as noted.  Right Carotid Findings: +----------+--------+--------+--------+------------------+-------------------+           PSV cm/sEDV cm/sStenosisPlaque DescriptionComments            +----------+--------+--------+--------+------------------+-------------------+ CCA Prox  59      7                                                     +----------+--------+--------+--------+------------------+-------------------+ CCA Distal59      7                                                     +----------+--------+--------+--------+------------------+-------------------+ ICA Prox  492     143     80-99%  calcific                              +----------+--------+--------+--------+------------------+-------------------+ ICA Mid   50      8                                                     +----------+--------+--------+--------+------------------+-------------------+ ICA Distal                                          Unable to visualize +----------+--------+--------+--------+------------------+-------------------+ ECA       393     13      >50%     calcific                              +----------+--------+--------+--------+------------------+-------------------+ +----------+--------+-------+---------+-------------------+           PSV  cm/sEDV cmsDescribe Arm Pressure (mmHG) +----------+--------+-------+---------+-------------------+ Subclavian220            Turbulent                    +----------+--------+-------+---------+-------------------+ +---------+--------+--+--------+-+---------+ VertebralPSV cm/s70EDV cm/s9Antegrade +---------+--------+--+--------+-+---------+  Left Carotid Findings: +----------+--------+--------+--------+------------------+--------+           PSV cm/sEDV cm/sStenosisPlaque DescriptionComments +----------+--------+--------+--------+------------------+--------+ CCA Prox  121     15                                         +----------+--------+--------+--------+------------------+--------+ CCA Distal156     15              calcific                   +----------+--------+--------+--------+------------------+--------+ ICA Prox  77      15      1-39%                              +----------+--------+--------+--------+------------------+--------+ ICA Mid   90      19                                         +----------+--------+--------+--------+------------------+--------+ ICA Distal87      17                                         +----------+--------+--------+--------+------------------+--------+ ECA       158     18                                         +----------+--------+--------+--------+------------------+--------+ +----------+--------+--------+----------------+-------------------+           PSV cm/sEDV cm/sDescribe        Arm Pressure (mmHG) +----------+--------+--------+----------------+-------------------+ [id number]             Multiphasic, WNL                    +----------+--------+--------+----------------+-------------------+  +---------+--------+--+--------+--+---------+ VertebralPSV cm/s80EDV cm/s17Antegrade +---------+--------+--+--------+--+---------+   Summary: Right Carotid: Velocities in the right ICA are consistent with a 80-99%                stenosis. The ECA appears >50% stenosed. Incidental: Large                thyroid nodule with internal calcifications noted. Left Carotid: Velocities in the left ICA are consistent with a 1-39% stenosis.               Incidental: Multiple thyroid nodules, some with internal               calcifications noted. Vertebrals:  Bilateral vertebral arteries demonstrate antegrade flow. Subclavians: Right subclavian artery flow was disturbed. Normal flow              hemodynamics were seen in the left subclavian artery. *See table(s) above for measurements and observations.  Electronically signed by Antony Contras MD on 12/27/2021 at 12:37:34 PM.    Final    ECHOCARDIOGRAM COMPLETE  Result Date: 12/27/2021  ECHOCARDIOGRAM REPORT   Patient Name:   Riko MARIE Zee Date of Exam: 12/27/2021 Medical Rec #:  [id number]           Height:       61.0 in Accession #:    [id number]          Weight:       147.7 lb Date of Birth:  May 14, 1940            BSA:          1.660 m Patient Age:    33 years            BP:           158/57 mmHg Patient Gender: F                   HR:           123 bpm. Exam Location:  Inpatient Procedure: 2D Echo, Cardiac Doppler and Color Doppler Indications:    Stroke I63.9  History:        Patient has prior history of Echocardiogram examinations, most                 recent 07/25/2021. Carotid Disease, Signs/Symptoms:Altered mental                 status unable to follow commands.; Risk Factors:Hypertension,                 Diabetes and Dyslipidemia.  Sonographer:    Darlina Sicilian RDCS Referring Phys: [id number] [place]  1. Peak intra-cavitary gradient 28 mmHg at rest due to hyperdynamic LV function. No mitral valve SAM or outflow tract obstruction.     Left  ventricular ejection fraction, by estimation, is >75%. The left ventricle has hyperdynamic function. The left ventricle has no regional wall motion abnormalities. There is mild asymmetric left ventricular hypertrophy of the basal-septal segment.  Left ventricular diastolic parameters are consistent with Grade I diastolic dysfunction (impaired relaxation).  2. Right ventricular systolic function is hyperdynamic. The right ventricular size is normal. Tricuspid regurgitation signal is inadequate for assessing PA pressure.  3. The mitral valve is grossly normal. No evidence of mitral valve regurgitation. No evidence of mitral stenosis.  4. The aortic valve is tricuspid. Aortic valve regurgitation is not visualized. No aortic stenosis is present.  5. The inferior vena cava is normal in size with greater than 50% respiratory variability, suggesting right atrial pressure of 3 mmHg. Comparison(s): No significant change from prior study. FINDINGS  Left Ventricle: Peak intra-cavitary gradient 28 mmHg at rest due to hyperdynamic LV function. No mitral valve SAM or outflow tract obstruction. Left ventricular ejection fraction, by estimation, is >75%. The left ventricle has hyperdynamic function. The  left ventricle has no regional wall motion abnormalities. The left ventricular internal cavity size was small. There is mild asymmetric left ventricular hypertrophy of the basal-septal segment. Indeterminate diastolic filling due to E-A fusion. Right Ventricle: The right ventricular size is normal. No increase in right ventricular wall thickness. Right ventricular systolic function is hyperdynamic. Tricuspid regurgitation signal is inadequate for assessing PA pressure. Left Atrium: Left atrial size was normal in size. Right Atrium: Right atrial size was normal in size. Pericardium: Trivial pericardial effusion is present. The pericardial effusion is surrounding the apex. Mitral Valve: The mitral valve is grossly normal. Mild mitral  annular calcification. No evidence of mitral valve regurgitation. No evidence of mitral valve stenosis. Tricuspid Valve: The tricuspid valve  is grossly normal. Tricuspid valve regurgitation is trivial. No evidence of tricuspid stenosis. Aortic Valve: The aortic valve is tricuspid. Aortic valve regurgitation is not visualized. No aortic stenosis is present. Pulmonic Valve: The pulmonic valve was grossly normal. Pulmonic valve regurgitation is not visualized. No evidence of pulmonic stenosis. Aorta: The aortic root is normal in size and structure. Venous: The inferior vena cava is normal in size with greater than 50% respiratory variability, suggesting right atrial pressure of 3 mmHg. IAS/Shunts: The atrial septum is grossly normal.  LEFT VENTRICLE PLAX 2D LVIDd:         4.00 cm     Diastology LVIDs:         2.10 cm     LV e' medial:    3.59 cm/s LV PW:         0.90 cm     LV E/e' medial:  20.9 LV IVS:        1.20 cm     LV e' lateral:   6.74 cm/s LVOT diam:     1.90 cm     LV E/e' lateral: 11.1 LV SV:         56 LV SV Index:   34 LVOT Area:     2.84 cm  LV Volumes (MOD) LV vol d, MOD A4C: 58.1 ml LV vol s, MOD A4C: 10.3 ml LV SV MOD A4C:     58.1 ml RIGHT VENTRICLE RV S prime:     23.90 cm/s TAPSE (M-mode): 1.9 cm LEFT ATRIUM             Index        RIGHT ATRIUM          Index LA diam:        3.30 cm 1.99 cm/m   RA Area:     8.57 cm LA Vol (A2C):   25.4 ml 15.30 ml/m  RA Volume:   13.70 ml 8.25 ml/m LA Vol (A4C):   28.6 ml 17.22 ml/m LA Biplane Vol: 27.2 ml 16.38 ml/m  AORTIC VALVE LVOT Vmax:   130.00 cm/s LVOT Vmean:  85.400 cm/s LVOT VTI:    0.197 m  AORTA Ao Root diam: 2.90 cm MITRAL VALVE MV Area (PHT): 9.48 cm     SHUNTS MV Decel Time: 80 msec      Systemic VTI:  0.20 m MV E velocity: 75.10 cm/s   Systemic Diam: 1.90 cm MV A velocity: 139.00 cm/s MV E/A ratio:  0.54 Eleonore Chiquito MD Electronically signed by Eleonore Chiquito MD Signature Date/Time: 12/27/2021/12:28:50 PM    Final    MR BRAIN WO  CONTRAST  Result Date: 12/26/2021 CLINICAL DATA:  Stroke suspected EXAM: MRI HEAD WITHOUT CONTRAST MRA HEAD WITHOUT CONTRAST MRA NECK WITHOUT CONTRAST TECHNIQUE: Multiplanar, multi-echo pulse sequences of the brain and surrounding structures were acquired without intravenous contrast. Angiographic images of the Circle of Willis were acquired using MRA technique without intravenous contrast. Angiographic images of the neck were acquired using MRA technique without intravenous contrast. Carotid stenosis measurements (when applicable) are obtained utilizing NASCET criteria, using the distal internal carotid diameter as the denominator. COMPARISON:  07/25/2021 MRI head, no prior MRA, correlation is made with CT head 07/26/2021 and CTA head and neck 07/24/2021 FINDINGS: MRI HEAD FINDINGS Evaluation is somewhat limited by motion artifact. Brain: Restricted diffusion with ADC correlate in the right MCA territory, the largest area of which involves the posterior right frontal lobe (series 2, image 33), but with additional involvement of  the right insula (series 2, image 28), more anterior right frontal lobe (series 2, image 36), and right parietal lobe (series 3, image 12). Many of these areas are adjacent to infarcts noted on 07/25/2021. No acute hemorrhage, mass, mass effect, or midline shift. Encephalomalacia in the right parietal lobe and watershed territory, likely sequela of the 07/25/2021 infarcts. No hydrocephalus or extra-axial collection. Unchanged size and configuration of the ventricles, with ex vacuo dilatation reflecting central volume loss. Confluent T2 hyperintense signal in the periventricular white matter, likely the sequela of severe chronic small vessel ischemic disease. Vascular: Please see MRA findings below. Skull and upper cervical spine: Normal marrow signal. Sinuses/Orbits: Clear paranasal sinuses. Status post bilateral lens replacements. Other: The mastoids are well aerated. MRA HEAD FINDINGS  Evaluation is limited by motion artifact. Anterior circulation: Grossly normal signal in the right ICA and proximal MCA. Poor visualization of the left ACA. Relatively poor signal in the right ICA, with no signal seen past the terminus (series 2, image 60). No definite flow signal is seen in right MCA. Possible signal in the right A1. Posterior circulation: The proximal vertebral arteries are not evaluated. The distal vertebral arteries are patent to the vertebrobasilar junction. The basilar artery is patent to the terminus. The SCA is are not well seen. Grossly normal flow in the bilateral PCAs. MRA NECK FINDINGS Evaluation is limited by motion in the absence of intravenous contrast. Aortic arch: Three-vessel arch. No evidence of aneurysm or dissection in the imaged aorta. Right carotid system: Severe stenosis at the origin of the ICA, with poor flow signal seen in the right compared to the left. On the 2D acquisition, flow signal is seen to the supraclinoid segment, but on the 3D acquisition, definitive signal is not seen past the mid right ICA. Left carotid system: Grossly patent. Vertebral arteries: Left dominant system, with relatively diminutive right vertebral artery. Both vertebral arteries appear patent to the V3 segment. Other: None IMPRESSION: 1. Evaluation is somewhat limited by motion artifact. Within this limitation, there are acute infarcts in the right MCA territory, with the largest area of infarction in the posterior right frontal lobe, but with additional involvement of the right insula, anterior right frontal lobe, and right parietal lobe. 2. Severe stenosis at the origin of the right ICA, with poor flow signal in the mid and distal right extracranial ICA, relatively diminished signal in the right intracranial ICA, and no signal seen in the right MCA. Minimal signal in the right A1. This is poorly evaluated given the degree of motion and the degree of stenosis and could be better evaluated with a  CTA head and neck. These results were called by telephone at the time of interpretation on 12/26/2021 at 7:36 pm to provider [facility] , who verbally acknowledged these results. Electronically Signed   By: Merilyn Baba M.D.   On: 12/26/2021 19:40   MR ANGIO HEAD WO CONTRAST  Result Date: 12/26/2021 CLINICAL DATA:  Stroke suspected EXAM: MRI HEAD WITHOUT CONTRAST MRA HEAD WITHOUT CONTRAST MRA NECK WITHOUT CONTRAST TECHNIQUE: Multiplanar, multi-echo pulse sequences of the brain and surrounding structures were acquired without intravenous contrast. Angiographic images of the Circle of Willis were acquired using MRA technique without intravenous contrast. Angiographic images of the neck were acquired using MRA technique without intravenous contrast. Carotid stenosis measurements (when applicable) are obtained utilizing NASCET criteria, using the distal internal carotid diameter as the denominator. COMPARISON:  07/25/2021 MRI head, no prior MRA, correlation is made with CT head 07/26/2021  and CTA head and neck 07/24/2021 FINDINGS: MRI HEAD FINDINGS Evaluation is somewhat limited by motion artifact. Brain: Restricted diffusion with ADC correlate in the right MCA territory, the largest area of which involves the posterior right frontal lobe (series 2, image 33), but with additional involvement of the right insula (series 2, image 28), more anterior right frontal lobe (series 2, image 36), and right parietal lobe (series 3, image 12). Many of these areas are adjacent to infarcts noted on 07/25/2021. No acute hemorrhage, mass, mass effect, or midline shift. Encephalomalacia in the right parietal lobe and watershed territory, likely sequela of the 07/25/2021 infarcts. No hydrocephalus or extra-axial collection. Unchanged size and configuration of the ventricles, with ex vacuo dilatation reflecting central volume loss. Confluent T2 hyperintense signal in the periventricular white matter, likely the sequela of severe  chronic small vessel ischemic disease. Vascular: Please see MRA findings below. Skull and upper cervical spine: Normal marrow signal. Sinuses/Orbits: Clear paranasal sinuses. Status post bilateral lens replacements. Other: The mastoids are well aerated. MRA HEAD FINDINGS Evaluation is limited by motion artifact. Anterior circulation: Grossly normal signal in the right ICA and proximal MCA. Poor visualization of the left ACA. Relatively poor signal in the right ICA, with no signal seen past the terminus (series 2, image 60). No definite flow signal is seen in right MCA. Possible signal in the right A1. Posterior circulation: The proximal vertebral arteries are not evaluated. The distal vertebral arteries are patent to the vertebrobasilar junction. The basilar artery is patent to the terminus. The SCA is are not well seen. Grossly normal flow in the bilateral PCAs. MRA NECK FINDINGS Evaluation is limited by motion in the absence of intravenous contrast. Aortic arch: Three-vessel arch. No evidence of aneurysm or dissection in the imaged aorta. Right carotid system: Severe stenosis at the origin of the ICA, with poor flow signal seen in the right compared to the left. On the 2D acquisition, flow signal is seen to the supraclinoid segment, but on the 3D acquisition, definitive signal is not seen past the mid right ICA. Left carotid system: Grossly patent. Vertebral arteries: Left dominant system, with relatively diminutive right vertebral artery. Both vertebral arteries appear patent to the V3 segment. Other: None IMPRESSION: 1. Evaluation is somewhat limited by motion artifact. Within this limitation, there are acute infarcts in the right MCA territory, with the largest area of infarction in the posterior right frontal lobe, but with additional involvement of the right insula, anterior right frontal lobe, and right parietal lobe. 2. Severe stenosis at the origin of the right ICA, with poor flow signal in the mid and  distal right extracranial ICA, relatively diminished signal in the right intracranial ICA, and no signal seen in the right MCA. Minimal signal in the right A1. This is poorly evaluated given the degree of motion and the degree of stenosis and could be better evaluated with a CTA head and neck. These results were called by telephone at the time of interpretation on 12/26/2021 at 7:36 pm to provider [facility] , who verbally acknowledged these results. Electronically Signed   By: Merilyn Baba M.D.   On: 12/26/2021 19:40   MR ANGIO NECK WO CONTRAST  Result Date: 12/26/2021 CLINICAL DATA:  Stroke suspected EXAM: MRI HEAD WITHOUT CONTRAST MRA HEAD WITHOUT CONTRAST MRA NECK WITHOUT CONTRAST TECHNIQUE: Multiplanar, multi-echo pulse sequences of the brain and surrounding structures were acquired without intravenous contrast. Angiographic images of the Circle of Willis were acquired using MRA technique without intravenous  contrast. Angiographic images of the neck were acquired using MRA technique without intravenous contrast. Carotid stenosis measurements (when applicable) are obtained utilizing NASCET criteria, using the distal internal carotid diameter as the denominator. COMPARISON:  07/25/2021 MRI head, no prior MRA, correlation is made with CT head 07/26/2021 and CTA head and neck 07/24/2021 FINDINGS: MRI HEAD FINDINGS Evaluation is somewhat limited by motion artifact. Brain: Restricted diffusion with ADC correlate in the right MCA territory, the largest area of which involves the posterior right frontal lobe (series 2, image 33), but with additional involvement of the right insula (series 2, image 28), more anterior right frontal lobe (series 2, image 36), and right parietal lobe (series 3, image 12). Many of these areas are adjacent to infarcts noted on 07/25/2021. No acute hemorrhage, mass, mass effect, or midline shift. Encephalomalacia in the right parietal lobe and watershed territory, likely sequela of  the 07/25/2021 infarcts. No hydrocephalus or extra-axial collection. Unchanged size and configuration of the ventricles, with ex vacuo dilatation reflecting central volume loss. Confluent T2 hyperintense signal in the periventricular white matter, likely the sequela of severe chronic small vessel ischemic disease. Vascular: Please see MRA findings below. Skull and upper cervical spine: Normal marrow signal. Sinuses/Orbits: Clear paranasal sinuses. Status post bilateral lens replacements. Other: The mastoids are well aerated. MRA HEAD FINDINGS Evaluation is limited by motion artifact. Anterior circulation: Grossly normal signal in the right ICA and proximal MCA. Poor visualization of the left ACA. Relatively poor signal in the right ICA, with no signal seen past the terminus (series 2, image 60). No definite flow signal is seen in right MCA. Possible signal in the right A1. Posterior circulation: The proximal vertebral arteries are not evaluated. The distal vertebral arteries are patent to the vertebrobasilar junction. The basilar artery is patent to the terminus. The SCA is are not well seen. Grossly normal flow in the bilateral PCAs. MRA NECK FINDINGS Evaluation is limited by motion in the absence of intravenous contrast. Aortic arch: Three-vessel arch. No evidence of aneurysm or dissection in the imaged aorta. Right carotid system: Severe stenosis at the origin of the ICA, with poor flow signal seen in the right compared to the left. On the 2D acquisition, flow signal is seen to the supraclinoid segment, but on the 3D acquisition, definitive signal is not seen past the mid right ICA. Left carotid system: Grossly patent. Vertebral arteries: Left dominant system, with relatively diminutive right vertebral artery. Both vertebral arteries appear patent to the V3 segment. Other: None IMPRESSION: 1. Evaluation is somewhat limited by motion artifact. Within this limitation, there are acute infarcts in the right MCA  territory, with the largest area of infarction in the posterior right frontal lobe, but with additional involvement of the right insula, anterior right frontal lobe, and right parietal lobe. 2. Severe stenosis at the origin of the right ICA, with poor flow signal in the mid and distal right extracranial ICA, relatively diminished signal in the right intracranial ICA, and no signal seen in the right MCA. Minimal signal in the right A1. This is poorly evaluated given the degree of motion and the degree of stenosis and could be better evaluated with a CTA head and neck. These results were called by telephone at the time of interpretation on 12/26/2021 at 7:36 pm to provider [facility] , who verbally acknowledged these results. Electronically Signed   By: Merilyn Baba M.D.   On: 12/26/2021 19:40   DG Chest 1 View  Result Date: 12/26/2021 CLINICAL  DATA:  [id number] Stroke (cerebrum) [facility]) [id number] EXAM: CHEST  1 VIEW COMPARISON:  September 19, 2018 FINDINGS: The cardiomediastinal silhouette is unchanged in contour.Atherosclerotic calcifications of the aorta. No pleural effusion. No pneumothorax. Mildly increased bronchitic markings bilaterally. No focal consolidation. Visualized abdomen is unremarkable. IMPRESSION: Mildly increased bronchitic markings bilaterally. This is nonspecific and could reflect bronchitis or mild underlying pulmonary edema. Electronically Signed   By: Valentino Saxon M.D.   On: 12/26/2021 19:37   CT HEAD WO CONTRAST  Result Date: 12/26/2021 CLINICAL DATA:  Neuro deficit, acute, stroke suspected EXAM: CT HEAD WITHOUT CONTRAST TECHNIQUE: Contiguous axial images were obtained from the base of the skull through the vertex without intravenous contrast. RADIATION DOSE REDUCTION: This exam was performed according to the departmental dose-optimization program which includes automated exposure control, adjustment of the mA and/or kV according to patient size and/or use of iterative  reconstruction technique. COMPARISON:  CT November 30, 2021. FINDINGS: Brain: New/interval infarct in the posterior right frontal lobe, potentially acute. No evidence of acute hemorrhage, hydrocephalus, extra-axial collection or mass lesion/mass effect. Additional remote right frontal and parietal infarcts. Additional patchy white matter hypodensities, nonspecific but compatible with chronic microvascular ischemic disease. Vascular: No hyperdense vessel.  Calcific atherosclerosis. Skull: No acute fracture. Sinuses/Orbits: No acute finding. Other: No mastoid effusions. IMPRESSION: New/interval infarct in the posterior right frontal lobe, potentially acute. Recommend MRI for further evaluation. Electronically Signed   By: Margaretha Sheffield M.D.   On: 12/26/2021 15:01    Microbiology: Results for orders placed or performed during the hospital encounter of 12/26/21  MRSA Next Gen by PCR, Nasal     Status: Abnormal   Collection Time: 12/28/21  7:05 AM   Specimen: Nasal Mucosa; Nasal Swab  Result Value Ref Range Status   MRSA by PCR Next Gen NEGATIVE (A) NOT DETECTED Final    Comment: Performed at [facility], [address], [place], [place] [address]    Labs: CBC: Recent Labs  Lab 12/29/21 0443 12/30/21 0318  WBC 11.5* 7.7  HGB 10.9* 10.6*  HCT 31.0* 30.1*  MCV 89.6 89.6  PLT 310 845   Basic Metabolic Panel: Recent Labs  Lab 12/29/21 0447 12/30/21 0318 01/01/22 0236 01/01/22 0715 01/02/22 0252  NA 137 143  --  136 135  K 3.3* 3.2* 3.4* 3.6 3.9  CL 111 113*  --  106 103  CO2 17* 22  --  22 25  GLUCOSE 265* 86  --  101* 138*  BUN 14 16  --  16 16  CREATININE 0.74 0.74  --  0.65 0.69  CALCIUM 9.2 10.2  --  9.6 9.4  MG  --  1.6* 1.5*  --  1.5*   Liver Function Tests: Recent Labs  Lab 12/30/21 0318  AST 11*  ALT 11  ALKPHOS 55  BILITOT 0.5  PROT 6.1*  ALBUMIN 2.8*   CBG: Recent Labs  Lab 01/03/22 0615 01/03/22 1145 01/03/22 1724 01/03/22 2141 01/04/22 0630   GLUCAP 148* 146* 200* 210* 165*    Discharge time spent: greater than 30 minutes.  Signed: Estill Cotta, MD [facility] 01/04/2022

## 2022-01-04 NOTE — Progress Notes (Signed)
Report called to [facility]. Bedside RN & LCSW notified.

## 2022-03-02 ENCOUNTER — Inpatient Hospital Stay: Payer: Medicare Other | Admitting: Neurology

## 2022-04-06 ENCOUNTER — Other Ambulatory Visit: Payer: Self-pay | Admitting: Family Medicine

## 2022-04-06 DIAGNOSIS — I10 Essential (primary) hypertension: Secondary | ICD-10-CM

## 2022-04-11 ENCOUNTER — Encounter: Payer: Self-pay | Admitting: Neurology

## 2022-04-11 ENCOUNTER — Ambulatory Visit (INDEPENDENT_AMBULATORY_CARE_PROVIDER_SITE_OTHER): Payer: Medicare Other | Admitting: Neurology

## 2022-04-11 VITALS — BP 152/76 | HR 75

## 2022-04-11 DIAGNOSIS — I63511 Cerebral infarction due to unspecified occlusion or stenosis of right middle cerebral artery: Secondary | ICD-10-CM

## 2022-04-11 DIAGNOSIS — G8114 Spastic hemiplegia affecting left nondominant side: Secondary | ICD-10-CM | POA: Diagnosis not present

## 2022-04-11 DIAGNOSIS — I6521 Occlusion and stenosis of right carotid artery: Secondary | ICD-10-CM

## 2022-04-11 NOTE — Progress Notes (Signed)
[facility] [address] [place] [address] [phone]       OFFICE FOLLOW-UP NOTE  [name] Date of Birth:  Jul 13, 1940 Medical Record Number:  [id number]   HPI: [name]  She is accompanied by her daughter.  History is obtained from them and review of electronic medical records.  I personally reviewed pertinent available imaging films in PACS.[name] is a 82 y.o. female with history of diabetes, recent stroke in July, hypertension, hyperlipidemia and pulmonary embolus on Eliquis presenting with acute onset left upper extremity weakness which occurred at her assisted living facility on Thursday.  Workup for medical causes of this weakness was undergone, and she was taken to the ER yesterday.  CT head taken in the emergency department was concerning for right frontal cortical stroke.  She was outside the window for both TNK and mechanical thrombectomy.  MRI brain showed restricted diffusion in right MCA territory.  MRA head and neck shows severe stenosis at origin of right ICA.  Carotid ultrasound showed 1-39% left ICA and 80-99% right ICA stenosis in the neck.  2D echo showed ejection fraction greater than 75% with mild basal septal LVH grade 1 diastolic dysfunction.  LDL cholesterol was 120 mg percent.  Hemoglobin A1c was elevated at 11.1.  Interventional radiology was consulted for potential stenting of the right proximal carotid near occlusive stenosis but patient refused any intervention.  The patient was on Eliquis prior to admission for pulmonary embolism and was switched to Xarelto.  He was transferred to skilled nursing facility for further ongoing therapy needs.  Remains there.  She is finished physical occupational therapy without much benefit.  She is able to move her left leg off the bed but is unable  to bear weight or walk yet.  She has no pain in the left upper extremity.  He is sitting better help with transfers.  She is tolerating Xarelto well without bleeding or bruising.  Blood pressure is usually under better control though today it is elevated at 152/76.  ROS:   14 system review of systems is positive for weakness, difficulty walking, leg swelling, bruising disorientation, confusion, poor memory and all other systems negative  PMH:  Past Medical History:  Diagnosis Date   Arthritis    Carotid stenosis 11/2018   Cataract    surgery to remove   Diabetes mellitus    type 2   Hyperlipidemia    Hypertension    Skin cancer    Removed from face   Stroke 09/19/2018   Urgency of urination    Urinary leakage     Social History:  Social History   Socioeconomic History   Marital status: Widowed    Spouse name: Not on file   Number of children: Not on file   Years of education: Not on file   Highest education level: Not on file  Occupational History   Not on file  Tobacco Use   Smoking status: Never   Smokeless tobacco: Never  Vaping Use   Vaping Use: Never used  Substance and Sexual Activity   Alcohol use: Yes    Alcohol/week: 1.0 standard drink of alcohol    Types: 1 Standard drinks or equivalent per week    Comment: maybe once per month - 3 drinks   Drug use: No   Sexual activity: Not Currently  Birth control/protection: Post-menopausal    Comment: Hysterectomy  Other Topics Concern   Not on file  Social History Narrative   Widowed. Education: [employer]. Exercise: 2 times a week for 30 minutes.   Social Determinants of Health   Financial Resource Strain: Low Risk  (03/18/2020)   Overall Financial Resource Strain (CARDIA)    Difficulty of Paying Living Expenses: Not hard at all  Food Insecurity: No Food Insecurity (03/18/2020)   Hunger Vital Sign    Worried About Running Out of Food in the Last Year: Never true    Ran Out of Food in the Last Year: Never true   Transportation Needs: No Transportation Needs (03/18/2020)   PRAPARE - Hydrologist (Medical): No    Lack of Transportation (Non-Medical): No  Physical Activity: Inactive (03/18/2020)   Exercise Vital Sign    Days of Exercise per Week: 0 days    Minutes of Exercise per Session: 0 min  Stress: No Stress Concern Present (03/18/2020)   [place]    Feeling of Stress : Not at all  Social Connections: Socially Isolated (03/18/2020)   Social Connection and Isolation Panel [NHANES]    Frequency of Communication with Friends and Family: More than three times a week    Frequency of Social Gatherings with Friends and Family: More than three times a week    Attends Religious Services: Never    Marine scientist or Organizations: No    Attends Archivist Meetings: Never    Marital Status: Widowed  Intimate Partner Violence: Not At Risk (03/18/2020)   Humiliation, Afraid, Rape, and Kick questionnaire    Fear of Current or Ex-Partner: No    Emotionally Abused: No    Physically Abused: No    Sexually Abused: No    Medications:   Current Outpatient Medications on File Prior to Visit  Medication Sig Dispense Refill   acetaminophen (TYLENOL) 325 MG tablet Take 650 mg by mouth in the morning and at bedtime.     amLODipine (NORVASC) 5 MG tablet TAKE 1 TABLET(5 MG) BY MOUTH DAILY (Patient taking differently: Take 5 mg by mouth daily.) 90 tablet 3   blood glucose meter kit and supplies Dispense based on patient and insurance preference. Pt just needs meter 1 each 0   Blood Glucose Monitoring Suppl (ONE TOUCH ULTRA MINI) w/Device KIT Use to test blood sugar daily as instructed. Dx: E11.65 1 kit 0   ezetimibe (ZETIA) 10 MG tablet Take 1 tablet (10 mg total) by mouth daily.     glipiZIDE (GLUCOTROL XL) 5 MG 24 hr tablet Take 2 tabs in the morning and 1 tab in the evening. (Patient taking differently:  Take 5 mg by mouth every evening.) 270 tablet 3   glucose blood (ONE TOUCH ULTRA TEST) test strip Test blood sugar 3 times a day. Dx code: 250.00 100 each 12   glucose blood (ONETOUCH ULTRA) test strip Use as instructed 100 each 12   insulin aspart (NOVOLOG) 100 UNIT/ML injection Inject 3 Units into the skin 3 (three) times daily with meals. 10 mL 11   insulin glargine-yfgn (SEMGLEE) 100 UNIT/ML injection Inject 0.33 mLs (33 Units total) into the skin daily. 10 mL 11   Insulin Pen Needle (BD PEN NEEDLE NANO U/F) 32G X 4 MM MISC USE TO INJECT INSULIN ONCE DAILY 100 each 6   Insulin Syringe-Needle U-100 (INSULIN SYRINGE .5CC/31GX5/16") 31G X 5/16" 0.5  ML MISC Use to inject insulin 1 time daily. 90 each 3   Lancets MISC 1 each by Does not apply route 3 (three) times daily. Dx: E11.65 100 each 0   magnesium oxide (MAG-OX) 400 (240 Mg) MG tablet Take 1 tablet (400 mg total) by mouth daily.     metoprolol tartrate (LOPRESSOR) 25 MG tablet Take 0.5 tablets (12.5 mg total) by mouth 2 (two) times daily.     Multiple Vitamin (MULTIVITAMIN WITH MINERALS) TABS tablet Take 1 tablet by mouth daily.     polyethylene glycol (MIRALAX / GLYCOLAX) 17 g packet Take 17 g by mouth daily as needed for moderate constipation. 14 each 0   rivaroxaban (XARELTO) 20 MG TABS tablet Take 1 tablet (20 mg total) by mouth daily with supper. 30 tablet    senna-docusate (SENOKOT-S) 8.6-50 MG tablet Take 2 tablets by mouth at bedtime.     sitaGLIPtin (JANUVIA) 25 MG tablet Take 25 mg by mouth daily.     venlafaxine XR (EFFEXOR-XR) 75 MG 24 hr capsule Take 1 capsule (75 mg total) by mouth daily with breakfast. 10 capsule 0   [DISCONTINUED] lisinopril (PRINIVIL,ZESTRIL) 20 MG tablet Take 20 mg by mouth daily.      No current facility-administered medications on file prior to visit.    Allergies:   Allergies  Allergen Reactions   Ace Inhibitors Diarrhea, Swelling, Other (See Comments) and Cough    Pt had cough and diarrhea with  first few doses of medication; also had swelling of right eyelid. Stopped medication on 03/17/11.   Alendronate Sodium Other (See Comments)    "Made my whole body hurt"   Aspirin Hives   Crestor [Rosuvastatin] Other (See Comments)    "Made my whole body hurt"   Lipitor [Atorvastatin Calcium] Other (See Comments)    "Made my whole body hurt"   Metformin And Related     Severe abdominal pain   Shellfish Allergy Nausea Only    Intolerant of fresh shellfish, reports the reaction is GI upset, denies hives, denies any swelling  Reports that she can tolerate canned seafood.    Statins Other (See Comments)    myalgia    Physical Exam General: well developed, well nourished, pleasant elderly Caucasian lady seated, in no evident distress Head: head normocephalic and atraumatic.  Neck: supple with no carotid or supraclavicular bruits Cardiovascular: regular rate and rhythm, no murmurs Musculoskeletal: no deformity Skin:  no rash/petichiae Vascular:  Normal pulses all extremities Vitals:   04/11/22 0941  BP: (!) 152/76  Pulse: 75   Neurologic Exam Mental Status: Awake and fully alert. Oriented to place and time. Recent and remote memory intact. Attention span, concentration and fund of knowledge appropriate. Mood and affect appropriate.  Recall 1/3.  Poor insight into her condition. Cranial Nerves: Fundoscopic exam reveals sharp disc margins. Pupils equal, briskly reactive to light. Extraocular movements full without nystagmus. Visual fields full to confrontation. Hearing intact. Facial sensation intact.  Left nasolabial fold asymmetry, tongue, palate moves normally and symmetrically.  Motor: Spastic left hemiplegia with left upper extremity 0/5 strength left lower extremity 3/5 strength with left foot drop.   sensory.:  Left lower face and body sensation to touch ,pinprick .position and vibratory sensation.  Coordination: Normal on the right and cannot be tested on the left.   Gait and  Station: Deferred as patient is unable to walk even with help of therapist Reflexes: 2+ and asymmetric and brisker on the left. Toes downgoing.   NIHSS  10 Modified Rankin  4   ASSESSMENT: 82 year old Caucasian lady with spastic left hemiparesis secondary to right MCA infarct and December 2023 due to symptomatic severe near occlusive proximal right ICA stenosis.  Patient refused carotid revascularization.  Vascular risk factors of diabetes, hypertension, hyperlipidemia and carotid.  History of DVT on Xarelto     PLAN:I had a long d/w patient and her daughter about her recent stroke, symptomatic right carotid stenosis, risk for recurrent stroke/TIAs, personally independently reviewed imaging studies and stroke evaluation results and answered questions.Continue Xarelto (rivaroxaban) daily  for secondary stroke prevention given history of DVT and maintain strict control of hypertension with blood pressure goal below 130/90, diabetes with hemoglobin A1c goal below 6.5% and lipids with LDL cholesterol goal below 70 mg/dL. I also advised the patient to eat a healthy diet with plenty of whole grains, cereals, fruits and vegetables, exercise regularly and maintain ideal body weight Patient continues to refuse right carotid revascularization procedure.  Physical and Occupational Therapy.  Followup in the future with me only as needed. Greater than 50% of time during this 35 minute visit was spent on counseling,explanation of diagnosis, planning of further management, discussion with patient and family and coordination of care Antony Contras, MD Note: This document was prepared with digital dictation and possible smart phrase technology. Any transcriptional errors that result from this process are unintentional

## 2022-04-11 NOTE — Patient Instructions (Signed)
I had a long d/w patient and her daughter about her recent stroke, symptomatic right carotid stenosis, risk for recurrent stroke/TIAs, personally independently reviewed imaging studies and stroke evaluation results and answered questions.Continue Xarelto (rivaroxaban) daily  for secondary stroke prevention given history of DVT and maintain strict control of hypertension with blood pressure goal below 130/90, diabetes with hemoglobin A1c goal below 6.5% and lipids with LDL cholesterol goal below 70 mg/dL. I also advised the patient to eat a healthy diet with plenty of whole grains, cereals, fruits and vegetables, exercise regularly and maintain ideal body weight Patient continues to refuse right carotid revascularization procedure.  Physical and Occupational Therapy.  Followup in the future with me only as needed.  Stroke Prevention Some medical conditions and behaviors can lead to a higher chance of having a stroke. You can help prevent a stroke by eating healthy, exercising, not smoking, and managing any medical conditions you have. Stroke is a leading cause of functional impairment. Primary prevention is particularly important because a majority of strokes are first-time events. Stroke changes the lives of not only those who experience a stroke but also their family and other caregivers. How can this condition affect me? A stroke is a medical emergency and should be treated right away. A stroke can lead to brain damage and can sometimes be life-threatening. If a person gets medical treatment right away, there is a better chance of surviving and recovering from a stroke. What can increase my risk? The following medical conditions may increase your risk of a stroke: Cardiovascular disease. High blood pressure (hypertension). Diabetes. High cholesterol. Sickle cell disease. Blood clotting disorders (hypercoagulable state). Obesity. Sleep disorders (obstructive sleep apnea). Other risk factors  include: Being older than age 86. Having a history of blood clots, stroke, or mini-stroke (transient ischemic attack, TIA). Genetic factors, such as race, ethnicity, or a family history of stroke. Smoking cigarettes or using other tobacco products. Taking birth control pills, especially if you also use tobacco. Heavy use of alcohol or drugs, especially cocaine and methamphetamine. Physical inactivity. What actions can I take to prevent this? Manage your health conditions High cholesterol levels. Eating a healthy diet is important for preventing high cholesterol. If cholesterol cannot be managed through diet alone, you may need to take medicines. Take any prescribed medicines to control your cholesterol as told by your health care provider. Hypertension. To reduce your risk of stroke, try to keep your blood pressure below 130/80. Eating a healthy diet and exercising regularly are important for controlling blood pressure. If these steps are not enough to manage your blood pressure, you may need to take medicines. Take any prescribed medicines to control hypertension as told by your health care provider. Ask your health care provider if you should monitor your blood pressure at home. Have your blood pressure checked every year, even if your blood pressure is normal. Blood pressure increases with age and some medical conditions. Diabetes. Eating a healthy diet and exercising regularly are important parts of managing your blood sugar (glucose). If your blood sugar cannot be managed through diet and exercise, you may need to take medicines. Take any prescribed medicines to control your diabetes as told by your health care provider. Get evaluated for obstructive sleep apnea. Talk to your health care provider about getting a sleep evaluation if you snore a lot or have excessive sleepiness. Make sure that any other medical conditions you have, such as atrial fibrillation or atherosclerosis, are  managed. Nutrition Follow instructions from your  health care provider about what to eat or drink to help manage your health condition. These instructions may include: Reducing your daily calorie intake. Limiting how much salt (sodium) you use to 1,500 milligrams (mg) each day. Using only healthy fats for cooking, such as olive oil, canola oil, or sunflower oil. Eating healthy foods. You can do this by: Choosing foods that are high in fiber, such as whole grains, and fresh fruits and vegetables. Eating at least 5 servings of fruits and vegetables a day. Try to fill one-half of your plate with fruits and vegetables at each meal. Choosing lean protein foods, such as lean cuts of meat, poultry without skin, fish, tofu, beans, and nuts. Eating low-fat dairy products. Avoiding foods that are high in sodium. This can help lower blood pressure. Avoiding foods that have saturated fat, trans fat, and cholesterol. This can help prevent high cholesterol. Avoiding processed and prepared foods. Counting your daily carbohydrate intake.  Lifestyle If you drink alcohol: Limit how much you have to: 0-1 drink a day for women who are not pregnant. 0-2 drinks a day for men. Know how much alcohol is in your drink. In the [place] one drink equals one 12 oz bottle of beer (32mL), one 5 oz glass of wine (155mL), or one 1 oz glass of hard liquor (84mL). Do not use any products that contain nicotine or tobacco. These products include cigarettes, chewing tobacco, and vaping devices, such as e-cigarettes. If you need help quitting, ask your health care provider. Avoid secondhand smoke. Do not use drugs. Activity  Try to stay at a healthy weight. Get at least 30 minutes of exercise on most days, such as: Fast walking. Biking. Swimming. Medicines Take over-the-counter and prescription medicines only as told by your health care provider. Aspirin or blood thinners (antiplatelets or anticoagulants) may be recommended  to reduce your risk of forming blood clots that can lead to stroke. Avoid taking birth control pills. Talk to your health care provider about the risks of taking birth control pills if: You are over 61 years old. You smoke. You get very bad headaches. You have had a blood clot. Where to find more information American Stroke Association: www.strokeassociation.org Get help right away if: You or a loved one has any symptoms of a stroke. "BE FAST" is an easy way to remember the main warning signs of a stroke: B - Balance. Signs are dizziness, sudden trouble walking, or loss of balance. E - Eyes. Signs are trouble seeing or a sudden change in vision. F - Face. Signs are sudden weakness or numbness of the face, or the face or eyelid drooping on one side. A - Arms. Signs are weakness or numbness in an arm. This happens suddenly and usually on one side of the body. S - Speech. Signs are sudden trouble speaking, slurred speech, or trouble understanding what people say. T - Time. Time to call emergency services. Write down what time symptoms started. You or a loved one has other signs of a stroke, such as: A sudden, severe headache with no known cause. Nausea or vomiting. Seizure. These symptoms may represent a serious problem that is an emergency. Do not wait to see if the symptoms will go away. Get medical help right away. Call your local emergency services (911 in the [place] Do not drive yourself to the hospital. Summary You can help to prevent a stroke by eating healthy, exercising, not smoking, limiting alcohol intake, and managing any medical conditions you may have.  Do not use any products that contain nicotine or tobacco. These include cigarettes, chewing tobacco, and vaping devices, such as e-cigarettes. If you need help quitting, ask your health care provider. Remember "BE FAST" for warning signs of a stroke. Get help right away if you or a loved one has any of these signs. This information  is not intended to replace advice given to you by your health care provider. Make sure you discuss any questions you have with your health care provider. Document Revised: 07/10/2019 Document Reviewed: 07/28/2019 Elsevier Patient Education  [employer].

## 2022-05-02 ENCOUNTER — Encounter (HOSPITAL_COMMUNITY): Payer: Self-pay

## 2022-05-02 ENCOUNTER — Emergency Department (HOSPITAL_COMMUNITY): Payer: Medicare Other

## 2022-05-02 ENCOUNTER — Emergency Department (HOSPITAL_COMMUNITY)
Admission: EM | Admit: 2022-05-02 | Discharge: 2022-05-02 | Disposition: A | Discharge status: Home / Self Care | Payer: Medicare Other | Attending: Emergency Medicine | Admitting: Emergency Medicine

## 2022-05-02 ENCOUNTER — Other Ambulatory Visit: Payer: Self-pay

## 2022-05-02 DIAGNOSIS — Z794 Long term (current) use of insulin: Secondary | ICD-10-CM | POA: Diagnosis not present

## 2022-05-02 DIAGNOSIS — S0990XA Unspecified injury of head, initial encounter: Secondary | ICD-10-CM | POA: Diagnosis present

## 2022-05-02 DIAGNOSIS — Z7984 Long term (current) use of oral hypoglycemic drugs: Secondary | ICD-10-CM | POA: Insufficient documentation

## 2022-05-02 DIAGNOSIS — W19XXXA Unspecified fall, initial encounter: Secondary | ICD-10-CM

## 2022-05-02 DIAGNOSIS — Z7901 Long term (current) use of anticoagulants: Secondary | ICD-10-CM | POA: Diagnosis not present

## 2022-05-02 DIAGNOSIS — W06XXXA Fall from bed, initial encounter: Secondary | ICD-10-CM | POA: Diagnosis not present

## 2022-05-02 DIAGNOSIS — Z79899 Other long term (current) drug therapy: Secondary | ICD-10-CM | POA: Diagnosis not present

## 2022-05-02 NOTE — ED Notes (Signed)
DC instructions reviewed with pt and provided to [facility].  2 attempts made to call [facility] for report with no answer.  Message left with daughter.  PT DC to [facility] via [place]

## 2022-05-02 NOTE — ED Provider Notes (Signed)
[place] EMERGENCY DEPARTMENT AT [facility] Provider Note   CSN: [id number] Arrival date & time: 05/02/22  4098     History  Chief Complaint  Patient presents with   Wanda [name] is a 82 y.o. female on xarelto who presents to the ED with a mechanical fall out of bed, reports she struck the back of her head.  Denies pain or injuries anywhere else.  Supplemental history provided by EMS  HPI     Home Medications Prior to Admission medications   Medication Sig Start Date End Date Taking? Authorizing Provider  acetaminophen (TYLENOL) 325 MG tablet Take 650 mg by mouth in the morning and at bedtime.    [provider]  amLODipine (NORVASC) 5 MG tablet TAKE 1 TABLET(5 MG) BY MOUTH DAILY Patient taking differently: Take 5 mg by mouth daily. 03/16/21   Copland, Gwenlyn Found, MD  blood glucose meter kit and supplies Dispense based on patient and insurance preference. Pt just needs meter 02/26/17   Copland, Gwenlyn Found, MD  Blood Glucose Monitoring Suppl (ONE TOUCH ULTRA MINI) w/Device KIT Use to test blood sugar daily as instructed. Dx: E11.65 01/26/21   Copland, Gwenlyn Found, MD  ezetimibe (ZETIA) 10 MG tablet Take 1 tablet (10 mg total) by mouth daily. 01/05/22   Rai, Ripudeep K, MD  glipiZIDE (GLUCOTROL XL) 5 MG 24 hr tablet Take 2 tabs in the morning and 1 tab in the evening. Patient taking differently: Take 5 mg by mouth every evening. 03/16/21   Copland, Gwenlyn Found, MD  glucose blood (ONE TOUCH ULTRA TEST) test strip Test blood sugar 3 times a day. Dx code: 250.00 03/16/21   Copland, Gwenlyn Found, MD  glucose blood (ONETOUCH ULTRA) test strip Use as instructed 06/08/21   Copland, Gwenlyn Found, MD  insulin aspart (NOVOLOG) 100 UNIT/ML injection Inject 3 Units into the skin 3 (three) times daily with meals. 07/29/21   Dahal, Melina Schools, MD  insulin glargine-yfgn (SEMGLEE) 100 UNIT/ML injection Inject 0.33 mLs (33 Units total) into the skin daily. 01/04/22   Rai, Ripudeep K, MD   Insulin Pen Needle (BD PEN NEEDLE NANO U/F) 32G X 4 MM MISC USE TO INJECT INSULIN ONCE DAILY 08/31/16   Copland, Gwenlyn Found, MD  Insulin Syringe-Needle U-100 (INSULIN SYRINGE .5CC/31GX5/16") 31G X 5/16" 0.5 ML MISC Use to inject insulin 1 time daily. 09/23/14   Carlus Pavlov, MD  Lancets MISC 1 each by Does not apply route 3 (three) times daily. Dx: E11.65 01/26/21   Copland, Gwenlyn Found, MD  magnesium oxide (MAG-OX) 400 (240 Mg) MG tablet Take 1 tablet (400 mg total) by mouth daily. 01/05/22   Rai, Delene Ruffini, MD  metoprolol tartrate (LOPRESSOR) 25 MG tablet Take 0.5 tablets (12.5 mg total) by mouth 2 (two) times daily. 01/04/22   Rai, Delene Ruffini, MD  Multiple Vitamin (MULTIVITAMIN WITH MINERALS) TABS tablet Take 1 tablet by mouth daily. 01/05/22   Rai, Ripudeep K, MD  polyethylene glycol (MIRALAX / GLYCOLAX) 17 g packet Take 17 g by mouth daily as needed for moderate constipation. 01/04/22   Rai, Delene Ruffini, MD  rivaroxaban (XARELTO) 20 MG TABS tablet Take 1 tablet (20 mg total) by mouth daily with supper. 01/04/22   Rai, Ripudeep K, MD  senna-docusate (SENOKOT-S) 8.6-50 MG tablet Take 2 tablets by mouth at bedtime. 01/04/22   Rai, Ripudeep K, MD  sitaGLIPtin (JANUVIA) 25 MG tablet Take 25 mg by mouth daily.    [provider]  venlafaxine XR (EFFEXOR-XR) 75 MG 24 hr capsule Take 1 capsule (75 mg total) by mouth daily with breakfast. 01/05/22   Rai, Ripudeep K, MD  lisinopril (PRINIVIL,ZESTRIL) 20 MG tablet Take 20 mg by mouth daily.   03/23/11  [provider]      Allergies    Ace inhibitors, Alendronate sodium, Aspirin, Crestor [rosuvastatin], Lipitor [atorvastatin calcium], Metformin and related, Shellfish allergy, and Statins    Review of Systems   Review of Systems  Physical Exam Updated Vital Signs BP (!) 153/68   Pulse 82   Temp 98.1 F (36.7 C) (Oral)   Resp 18   Ht  (1.6 m)   Wt 65.8 kg   LMP  (LMP Unknown)   SpO2 100%   BMI 25.69 kg/m  Physical  Exam Constitutional:      General: She is not in acute distress. HENT:     Head: Normocephalic and atraumatic.  Eyes:     Conjunctiva/sclera: Conjunctivae normal.     Pupils: Pupils are equal, round, and reactive to light.  Cardiovascular:     Rate and Rhythm: Normal rate and regular rhythm.  Pulmonary:     Effort: Pulmonary effort is normal. No respiratory distress.  Abdominal:     General: There is no distension.     Tenderness: There is no abdominal tenderness.  Skin:    General: Skin is warm and dry.  Neurological:     General: No focal deficit present.     Mental Status: She is alert and oriented to person, place, and time. Mental status is at baseline.     ED Results / Procedures / Treatments   Labs (all labs ordered are listed, but only abnormal results are displayed) Labs Reviewed - No data to display  EKG EKG Interpretation  Date/Time:  Tuesday May 02 2022 06:59:11 EDT Ventricular Rate:  78 PR Interval:  143 QRS Duration: 89 QT Interval:  397 QTC Calculation: 453 R Axis:   50 Text Interpretation: Sinus rhythm Confirmed by Alvester Chou [phone]) on 05/02/2022 7:27:32 AM  Radiology CT Head Wo Contrast  Result Date: 05/02/2022 CLINICAL DATA:  Patient rolled out of bed and hit head on floor. 12/29/2021 EXAM: CT HEAD WITHOUT CONTRAST TECHNIQUE: Contiguous axial images were obtained from the base of the skull through the vertex without intravenous contrast. RADIATION DOSE REDUCTION: This exam was performed according to the departmental dose-optimization program which includes automated exposure control, adjustment of the mA and/or kV according to patient size and/or use of iterative reconstruction technique. COMPARISON:  None Available. FINDINGS: Brain: There is no evidence for acute hemorrhage, hydrocephalus, mass lesion, or abnormal extra-axial fluid collection. No definite CT evidence for acute infarction. Diffuse loss of parenchymal volume is consistent with  atrophy. Patchy low attenuation in the deep hemispheric and periventricular white matter is nonspecific, but likely reflects chronic microvascular ischemic demyelination. Interval evolution of the right MCA territory infarct with stable chronic right parietal lobe infarct. Lacunar infarcts in the basal ganglia bilaterally with probable chronic lacunar infarct left cerebellar hemisphere. Vascular: No hyperdense vessel or unexpected calcification. Skull: No evidence for fracture. No worrisome lytic or sclerotic lesion. Sinuses/Orbits: The visualized paranasal sinuses and mastoid air cells are clear. Visualized portions of the globes and intraorbital fat are unremarkable. Other: None. IMPRESSION: 1. No acute intracranial abnormality. 2. Chronic appearance of right MCA territory infarcts. 3. Chronic lacunar infarcts in the basal ganglia bilaterally with probable chronic lacunar infarct left cerebellar hemisphere. 4. Atrophy with  chronic small vessel ischemic disease. Electronically Signed   By: Kennith Center M.D.   On: 05/02/2022 08:01    Procedures Procedures    Medications Ordered in ED Medications - No data to display  ED Course/ Medical Decision Making/ A&P Clinical Course as of 05/02/22 0809  Tue May 02, 2022  0805 No answer from daughter Wanda [name] on cell phone for update [MT]    Clinical Course User Index [MT] Renaye Rakers Kermit Balo, MD                             Medical Decision Making Amount and/or Complexity of Data Reviewed Radiology: ordered.   82 yo female here with isolated injury to back of head after a fall  CT imaging of the head personally reviewed, showing no emergent or acute findings.  Chronic CVA noted.  No evidence of other trauma on exam, including hip fx, spinal fx.  Supplemental history provided by EMS on arrival. I was unable to reach her daughter who is her emergency contact by phone this morning.  We will need to arrange transport back to  facility.        Final Clinical Impression(s) / ED Diagnoses Final diagnoses:  Fall, initial encounter  Injury of head, initial encounter    Rx / DC Orders ED Discharge Orders     None         Arnie Clingenpeel, Kermit Balo, MD 05/02/22 [id number]

## 2022-05-02 NOTE — ED Triage Notes (Signed)
Pt rolled out of bed and hit her head on floor around 6 am. Does take xarelto. Mentation is at bac=seline now. Dementia.

## 2022-05-02 NOTE — ED Notes (Signed)
Attempted to call [facility] with no answer.  Left message with daughter

## 2022-05-02 NOTE — ED Notes (Signed)
2nd attempt made to contact [facility] without success

## 2022-05-02 NOTE — Discharge Instructions (Addendum)
The CT scan did not show signs of acute bleeding or injury to Wanda [name]'s brain, after falling and striking her head.

## 2022-05-09 ENCOUNTER — Other Ambulatory Visit: Payer: Self-pay

## 2022-05-09 ENCOUNTER — Emergency Department (HOSPITAL_COMMUNITY)
Admission: EM | Admit: 2022-05-09 | Discharge: 2022-05-09 | Disposition: A | Discharge status: Skilled Nursing Facility | Payer: Medicare Other | Attending: Emergency Medicine | Admitting: Emergency Medicine

## 2022-05-09 ENCOUNTER — Emergency Department (HOSPITAL_COMMUNITY): Payer: Medicare Other

## 2022-05-09 ENCOUNTER — Encounter (HOSPITAL_COMMUNITY): Payer: Self-pay

## 2022-05-09 DIAGNOSIS — Z7984 Long term (current) use of oral hypoglycemic drugs: Secondary | ICD-10-CM | POA: Insufficient documentation

## 2022-05-09 DIAGNOSIS — Z79899 Other long term (current) drug therapy: Secondary | ICD-10-CM | POA: Diagnosis not present

## 2022-05-09 DIAGNOSIS — Z85828 Personal history of other malignant neoplasm of skin: Secondary | ICD-10-CM | POA: Diagnosis not present

## 2022-05-09 DIAGNOSIS — R410 Disorientation, unspecified: Secondary | ICD-10-CM | POA: Diagnosis present

## 2022-05-09 DIAGNOSIS — E1165 Type 2 diabetes mellitus with hyperglycemia: Secondary | ICD-10-CM | POA: Insufficient documentation

## 2022-05-09 DIAGNOSIS — N3001 Acute cystitis with hematuria: Secondary | ICD-10-CM | POA: Diagnosis not present

## 2022-05-09 DIAGNOSIS — W19XXXA Unspecified fall, initial encounter: Secondary | ICD-10-CM | POA: Insufficient documentation

## 2022-05-09 DIAGNOSIS — R7401 Elevation of levels of liver transaminase levels: Secondary | ICD-10-CM | POA: Insufficient documentation

## 2022-05-09 DIAGNOSIS — Z794 Long term (current) use of insulin: Secondary | ICD-10-CM | POA: Insufficient documentation

## 2022-05-09 DIAGNOSIS — I10 Essential (primary) hypertension: Secondary | ICD-10-CM | POA: Diagnosis not present

## 2022-05-09 LAB — URINALYSIS, ROUTINE W REFLEX MICROSCOPIC
Bilirubin Urine: NEGATIVE
Glucose, UA: 150 mg/dL — AB
Ketones, ur: NEGATIVE mg/dL
Leukocytes,Ua: NEGATIVE
Nitrite: NEGATIVE
Protein, ur: NEGATIVE mg/dL
Specific Gravity, Urine: 1.015 (ref 1.005–1.030)
pH: 6 (ref 5.0–8.0)

## 2022-05-09 LAB — CBC WITH DIFFERENTIAL/PLATELET
Abs Immature Granulocytes: 0.03 10*3/uL (ref 0.00–0.07)
Basophils Absolute: 0 10*3/uL (ref 0.0–0.1)
Basophils Relative: 0 %
Eosinophils Absolute: 0 10*3/uL (ref 0.0–0.5)
Eosinophils Relative: 1 %
HCT: 38.4 % (ref 36.0–46.0)
Hemoglobin: 12.8 g/dL (ref 12.0–15.0)
Immature Granulocytes: 0 %
Lymphocytes Relative: 23 %
Lymphs Abs: 1.8 10*3/uL (ref 0.7–4.0)
MCH: 30.6 pg (ref 26.0–34.0)
MCHC: 33.3 g/dL (ref 30.0–36.0)
MCV: 91.9 fL (ref 80.0–100.0)
Monocytes Absolute: 0.7 10*3/uL (ref 0.1–1.0)
Monocytes Relative: 9 %
Neutro Abs: 5.2 10*3/uL (ref 1.7–7.7)
Neutrophils Relative %: 67 %
Platelets: 309 10*3/uL (ref 150–400)
RBC: 4.18 MIL/uL (ref 3.87–5.11)
RDW: 13.1 % (ref 11.5–15.5)
WBC: 7.9 10*3/uL (ref 4.0–10.5)
nRBC: 0 % (ref 0.0–0.2)

## 2022-05-09 LAB — COMPREHENSIVE METABOLIC PANEL
ALT: 63 U/L — ABNORMAL HIGH (ref 0–44)
AST: 37 U/L (ref 15–41)
Albumin: 3.7 g/dL (ref 3.5–5.0)
Alkaline Phosphatase: 55 U/L (ref 38–126)
Anion gap: 12 (ref 5–15)
BUN: 14 mg/dL (ref 8–23)
CO2: 24 mmol/L (ref 22–32)
Calcium: 10.1 mg/dL (ref 8.9–10.3)
Chloride: 103 mmol/L (ref 98–111)
Creatinine, Ser: 0.68 mg/dL (ref 0.44–1.00)
GFR, Estimated: >60 mL/min (ref >60)
Glucose, Bld: 234 mg/dL — ABNORMAL HIGH (ref 70–99)
Potassium: 3.4 mmol/L — ABNORMAL LOW (ref 3.5–5.1)
Sodium: 139 mmol/L (ref 135–145)
Total Bilirubin: 0.5 mg/dL (ref 0.3–1.2)
Total Protein: 7.2 g/dL (ref 6.5–8.1)

## 2022-05-09 LAB — CK: Total CK: 58 U/L (ref 38–234)

## 2022-05-09 LAB — CBG MONITORING, ED: Glucose-Capillary: 208 mg/dL — ABNORMAL HIGH (ref 70–99)

## 2022-05-09 MED ORDER — CEFPODOXIME PROXETIL 200 MG PO TABS: 200.0000 mg | ORAL_TABLET | Freq: Two times a day (BID) | ORAL | 0 refills | Status: AC

## 2022-05-09 NOTE — ED Notes (Signed)
[facility] called unable to give pick up time 

## 2022-05-09 NOTE — ED Triage Notes (Signed)
Pt BIB [facility] from [facility] after being found on the floor this morning. Pt is confused at baseline but states she did not hit her head. Pt has no complaints and states she sleeps on the floor from time to time.

## 2022-05-09 NOTE — ED Provider Notes (Signed)
[place] EMERGENCY DEPARTMENT AT [facility] Provider Note   CSN: [id number] Arrival date & time: 05/09/22  4098     History  Chief Complaint  Patient presents with   Wanda [name] is a 82 y.o. female with dementia, h/o DVT on Xarelto, HTN, HLD, h/o CVA, T2DM on insulin presents with fall.  Patient is found down on the floor of her room this AM by facility staff at [facility].  Per the facility she is reportedly confused at baseline.  Patient states that she did not fall and that she was actually sleeping on the floor because sometimes she gets uncomfortable in the bed and moves her pillow to the floor in order to help her aches and pains.  She states that she did fall last Friday however and also fell yesterday and was evaluated both times in the emergency department.  Per chart review patient was evaluated last Tuesday, 05/02/2022, for fall and was not evaluated yesterday.  Patient currently denies any pain anywhere, including headache or neck pain, chest/abdominal pain.  Denies any urinary symptoms or nausea and vomiting.  Called and talked to [facility] RN day shift who stated that patient actually had a fall overnight last night at 0130 AM, and the provider wanted her evaluated. The RN states that the patient doesn't sleep on the floor. She is A&Ox2 at baseline. Unknown how long patient laid on the floor.   Fall       Home Medications Prior to Admission medications   Medication Sig Start Date End Date Taking? Authorizing Provider  acetaminophen (TYLENOL) 325 MG tablet Take 650 mg by mouth in the morning and at bedtime.   Yes [provider]  Amino Acids-Protein Hydrolys (PRO-STAT PO) Take 30 mLs by mouth daily.   Yes [provider]  amLODipine (NORVASC) 5 MG tablet TAKE 1 TABLET(5 MG) BY MOUTH DAILY Patient taking differently: Take 5 mg by mouth daily. 03/16/21  Yes Copland, Gwenlyn Found, MD  cefpodoxime (VANTIN) 200 MG tablet Take 1 tablet  (200 mg total) by mouth 2 (two) times daily for 10 days. 05/09/22 05/19/22 Yes Loetta Rough, MD  ezetimibe (ZETIA) 10 MG tablet Take 1 tablet (10 mg total) by mouth daily. 01/05/22  Yes Rai, Ripudeep K, MD  insulin aspart (NOVOLOG) 100 UNIT/ML injection Inject 3 Units into the skin 3 (three) times daily with meals. Patient taking differently: Inject 2-6 Units into the skin daily. Inject as per sliding scale: If 200-280= 2 units 281-320= 4 units 321-400= 6 units 07/29/21  Yes Dahal, Melina Schools, MD  insulin glargine-yfgn (SEMGLEE) 100 UNIT/ML injection Inject 0.33 mLs (33 Units total) into the skin daily. Patient taking differently: Inject 10 Units into the skin every evening. 01/04/22  Yes Rai, Ripudeep K, MD  insulin glargine-yfgn (SEMGLEE, YFGN,) 100 UNIT/ML injection Inject 20 Units into the skin daily.   Yes [provider]  magnesium oxide (MAG-OX) 400 (240 Mg) MG tablet Take 1 tablet (400 mg total) by mouth daily. 01/05/22  Yes Rai, Ripudeep K, MD  metoprolol tartrate (LOPRESSOR) 25 MG tablet Take 0.5 tablets (12.5 mg total) by mouth 2 (two) times daily. 01/04/22  Yes Rai, Ripudeep K, MD  Multiple Vitamin (MULTIVITAMIN WITH MINERALS) TABS tablet Take 1 tablet by mouth daily. 01/05/22  Yes Rai, Ripudeep K, MD  polyethylene glycol (MIRALAX / GLYCOLAX) 17 g packet Take 17 g by mouth daily as needed for moderate constipation. 01/04/22  Yes Rai, Delene Ruffini, MD  rivaroxaban (XARELTO) 20 MG TABS tablet Take 1 tablet (20 mg total) by mouth daily with supper. 01/04/22  Yes Rai, Ripudeep K, MD  senna-docusate (SENOKOT-S) 8.6-50 MG tablet Take 2 tablets by mouth at bedtime. 01/04/22  Yes Rai, Ripudeep K, MD  sitaGLIPtin (JANUVIA) 25 MG tablet Take 25 mg by mouth every morning.   Yes [provider]  venlafaxine (EFFEXOR) 37.5 MG tablet Take 37.5 mg by mouth every morning.   Yes [provider]  blood glucose meter kit and supplies Dispense based on patient and insurance preference.  Pt just needs meter 02/26/17   Copland, Gwenlyn Found, MD  Blood Glucose Monitoring Suppl (ONE TOUCH ULTRA MINI) w/Device KIT Use to test blood sugar daily as instructed. Dx: E11.65 01/26/21   Copland, Gwenlyn Found, MD  glipiZIDE (GLUCOTROL XL) 5 MG 24 hr tablet Take 2 tabs in the morning and 1 tab in the evening. Patient not taking: Reported on 05/09/2022 03/16/21   Copland, Gwenlyn Found, MD  glucose blood (ONE TOUCH ULTRA TEST) test strip Test blood sugar 3 times a day. Dx code: 250.00 03/16/21   Copland, Gwenlyn Found, MD  glucose blood (ONETOUCH ULTRA) test strip Use as instructed 06/08/21   Copland, Gwenlyn Found, MD  Insulin Pen Needle (BD PEN NEEDLE NANO U/F) 32G X 4 MM MISC USE TO INJECT INSULIN ONCE DAILY 08/31/16   Copland, Gwenlyn Found, MD  Insulin Syringe-Needle U-100 (INSULIN SYRINGE .5CC/31GX5/16") 31G X 5/16" 0.5 ML MISC Use to inject insulin 1 time daily. 09/23/14   Carlus Pavlov, MD  Lancets MISC 1 each by Does not apply route 3 (three) times daily. Dx: E11.65 01/26/21   Copland, Gwenlyn Found, MD  venlafaxine XR (EFFEXOR-XR) 75 MG 24 hr capsule Take 1 capsule (75 mg total) by mouth daily with breakfast. Patient not taking: Reported on 05/09/2022 01/05/22   Rai, Delene Ruffini, MD  lisinopril (PRINIVIL,ZESTRIL) 20 MG tablet Take 20 mg by mouth daily.   03/23/11  [provider]      Allergies    Ace inhibitors, Alendronate sodium, Aspirin, Crestor [rosuvastatin], Lipitor [atorvastatin calcium], Metformin and related, Shellfish allergy, and Statins    Review of Systems   Review of Systems Review of systems Negative for head trauma, LOC.  A 10 point review of systems was performed and is negative unless otherwise reported in HPI.  Physical Exam Updated Vital Signs BP (!) 156/64   Pulse 87   Temp 98.3 F (36.8 C) (Axillary)   Resp 17   LMP  (LMP Unknown)   SpO2 100%  Physical Exam General: Normal appearing female, lying in bed.  HEENT: PERRLA, Sclera anicteric, MMM, trachea midline.  Cardiology:  RRR, no murmurs/rubs/gallops. BL radial and DP pulses equal bilaterally.  Resp: Normal respiratory rate and effort. CTAB, no wheezes, rhonchi, crackles.  Abd: Soft, non-tender, non-distended. No rebound tenderness or guarding.  GU: Deferred. MSK: No peripheral edema or signs of trauma. Extremities without deformity or TTP. No cyanosis or clubbing. Skin: warm, dry. No rashes or lesions. Back: No CVA tenderness Neuro: A&Ox4, CNs II-XII grossly intact. MAEs. Sensation grossly intact.  Psych: Normal mood and affect.   ED Results / Procedures / Treatments   Labs (all labs ordered are listed, but only abnormal results are displayed) Labs Reviewed  COMPREHENSIVE METABOLIC PANEL - Abnormal; Notable for the following components:      Result Value   Potassium 3.4 (*)    Glucose, Bld 234 (*)    ALT 63 (*)  All other components within normal limits  URINALYSIS, ROUTINE W REFLEX MICROSCOPIC - Abnormal; Notable for the following components:   APPearance CLOUDY (*)    Glucose, UA 150 (*)    Hgb urine dipstick MODERATE (*)    Bacteria, UA MANY (*)    All other components within normal limits  CBG MONITORING, ED - Abnormal; Notable for the following components:   Glucose-Capillary 208 (*)    All other components within normal limits  CBC WITH DIFFERENTIAL/PLATELET  CK    EKG EKG Interpretation  Date/Time:  Tuesday May 09 2022 07:44:54 EDT Ventricular Rate:  88 PR Interval:  130 QRS Duration: 88 QT Interval:  370 QTC Calculation: 448 R Axis:   52 Text Interpretation: Sinus rhythm  Similar to prior Confirmed by Vivi Barrack [id number]) on 05/09/2022 8:18:34 AM  Radiology CT Head Wo Contrast  Result Date: 05/09/2022 CLINICAL DATA:  Fall EXAM: CT HEAD WITHOUT CONTRAST TECHNIQUE: Contiguous axial images were obtained from the base of the skull through the vertex without intravenous contrast. RADIATION DOSE REDUCTION: This exam was performed according to the departmental dose-optimization  program which includes automated exposure control, adjustment of the mA and/or kV according to patient size and/or use of iterative reconstruction technique. COMPARISON:  Head CT 05/02/2022. FINDINGS: Brain: There is no acute intracranial hemorrhage, extra-axial fluid collection, or acute infarct. Background parenchymal volume loss with prominence of the ventricular system and extra-axial CSF spaces is unchanged. There is unchanged multifocal cortical encephalomalacia throughout the right cerebral hemisphere with ex vacuo dilatation of the right lateral ventricle. A small remote lacunar infarct in the left lentiform nucleus is unchanged. The pituitary and suprasellar region are normal. There is no mass lesion. There is no mass effect or midline shift. Vascular: There is calcification of the bilateral carotid siphons and vertebral arteries. Skull: Normal. Negative for fracture or focal lesion. Sinuses/Orbits: The paranasal sinuses are clear. Bilateral lens implants are in place. The globes and orbits are otherwise unremarkable. Other: None. IMPRESSION: Stable noncontrast head CT with no acute intracranial pathology. Electronically Signed   By: Lesia Hausen M.D.   On: 05/09/2022 08:09    Procedures Procedures    Medications Ordered in ED Medications - No data to display  ED Course/ Medical Decision Making/ A&P                          Medical Decision Making Amount and/or Complexity of Data Reviewed Labs: ordered. Decision-making details documented in ED Course. Radiology: ordered. Decision-making details documented in ED Course.  Risk Prescription drug management.    This patient presents to the ED for concern of fall, this involves an extensive number of treatment options, and is a complaint that carries with it a high risk of complications and morbidity.  I considered the following differential and admission for this acute, potentially life threatening condition.   MDM:    Patient's head  is NCAT, and she states she didn't fall or have any head trauma today.  However patient is an unreliable historian given that she related that she did also fall yesterday and was evaluated in the ED which I cannot find evidence of in her documentation.  She states that she does sometimes sleep on the floor which may very well be true however I cannot prove given her confusion that she did not fall overnight.  She is very well-appearing and overall hemodynamically stable.  Will obtain a CT head to rule out ICH  or intracranial trauma as well as a glucose and EKG.   After discussing with staff at [facility], unclear how long patient laid on the floor overnight, RN also unaware of patient ever sleeping on the floor, and will eval w/ basic labs and CK. Patient is normally confused and appears to be at her mental status baseline.   Clinical Course as of 05/09/22 0956  Tue May 09, 2022  0817 CT Head Wo Contrast Stable noncontrast head CT with no acute intracranial pathology. [HN]  [id number] CBC with Differential wnl [HN]  0919 Comprehensive metabolic panel(!) Mild hyperglycemia and elevation in ALT, patient with no RUQ TTP, no abd pain or N/V. No leukocytosis or fever. Very low c/f acute hepatobiliary pathology such as cholecystitis/cholangitis, cholelithiasis. Can f/u o/p.  [HN]  0920 CK Total: 58 [HN]  0953 UTI w/ many bacteria, 11-20 whites, and 6-10 RBCs. She has some squamous cells but also mucous, patient with several falls recently, will treat for UTI w/ cefpodoxime 200 mg BID x 10 days. Other w/u unremarkable. Patient with no fever, HDS, well-appearing, she has no complaints. Will DC back to facility w/ DC instructions/return precautions. [HN]    Clinical Course User Index [HN] Loetta Rough, MD    Labs: I Ordered, and personally interpreted labs.  The pertinent results include:  those listed above  Imaging Studies ordered: I ordered imaging studies including CTH I independently visualized and  interpreted imaging. I agree with the radiologist interpretation  Additional history obtained from chart review, RN at facility.  Cardiac Monitoring: The patient was maintained on a cardiac monitor.  I personally viewed and interpreted the cardiac monitored which showed an underlying rhythm of: Normal sinus rhythm  Social Determinants of Health:  patient lives at [place]  Disposition:  DC back to facility  Co morbidities that complicate the patient evaluation  Past Medical History:  Diagnosis Date   Arthritis    Carotid stenosis 11/2018   Cataract    surgery to remove   Diabetes mellitus    type 2   Hyperlipidemia    Hypertension    Skin cancer    Removed from face   Stroke [facility]) 09/19/2018   Urgency of urination    Urinary leakage      Medicines Meds ordered this encounter  Medications   cefpodoxime (VANTIN) 200 MG tablet    Sig: Take 1 tablet (200 mg total) by mouth 2 (two) times daily for 10 days.    Dispense:  20 tablet    Refill:  0    I have reviewed the patients home medicines and have made adjustments as needed  Problem List / ED Course: Problem List Items Addressed This Visit   None Visit Diagnoses     Fall, initial encounter    -  Primary   Acute cystitis with hematuria                       This note was created using dictation software, which may contain spelling or grammatical errors.    Loetta Rough, MD 05/09/22 [phone]

## 2022-05-09 NOTE — ED Notes (Signed)
Pt placed on bedpan to attempt to obtain urinary sample.

## 2022-05-09 NOTE — Discharge Instructions (Signed)
Thank you for coming to [facility] Emergency Department. You were seen for fall. We did an exam, labs, and imaging, and these showed likely a UTI.  We have prescribed cefpodoxime 2 mg twice per day for 10 days. Please follow up with your primary care provider within 1-2 weeks as needed or if symptoms worsen.  Do not hesitate to return to the ED or call 911 if you experience: -Worsening symptoms -Lightheadedness, passing out -Fevers/chills -Anything else that concerns you

## 2023-02-02 ENCOUNTER — Emergency Department (HOSPITAL_COMMUNITY): Payer: Medicare Other

## 2023-02-02 ENCOUNTER — Encounter (HOSPITAL_COMMUNITY): Payer: Self-pay | Admitting: Emergency Medicine

## 2023-02-02 ENCOUNTER — Inpatient Hospital Stay (HOSPITAL_COMMUNITY)
Admission: EM | Admit: 2023-02-02 | Discharge: 2023-02-07 | DRG: 391 | Disposition: A | Discharge status: Hospice | Payer: Medicare Other | Source: Skilled Nursing Facility | Attending: Internal Medicine | Admitting: Internal Medicine

## 2023-02-02 ENCOUNTER — Other Ambulatory Visit: Payer: Self-pay

## 2023-02-02 ENCOUNTER — Inpatient Hospital Stay (HOSPITAL_COMMUNITY): Payer: Medicare Other

## 2023-02-02 DIAGNOSIS — E119 Type 2 diabetes mellitus without complications: Secondary | ICD-10-CM | POA: Diagnosis not present

## 2023-02-02 DIAGNOSIS — K922 Gastrointestinal hemorrhage, unspecified: Secondary | ICD-10-CM

## 2023-02-02 DIAGNOSIS — K5289 Other specified noninfective gastroenteritis and colitis: Principal | ICD-10-CM | POA: Diagnosis present

## 2023-02-02 DIAGNOSIS — F0394 Unspecified dementia, unspecified severity, with anxiety: Secondary | ICD-10-CM | POA: Diagnosis present

## 2023-02-02 DIAGNOSIS — Z803 Family history of malignant neoplasm of breast: Secondary | ICD-10-CM

## 2023-02-02 DIAGNOSIS — I10 Essential (primary) hypertension: Secondary | ICD-10-CM | POA: Diagnosis present

## 2023-02-02 DIAGNOSIS — Z8249 Family history of ischemic heart disease and other diseases of the circulatory system: Secondary | ICD-10-CM

## 2023-02-02 DIAGNOSIS — K5649 Other impaction of intestine: Secondary | ICD-10-CM | POA: Diagnosis present

## 2023-02-02 DIAGNOSIS — Z681 Body mass index (BMI) 19 or less, adult: Secondary | ICD-10-CM

## 2023-02-02 DIAGNOSIS — F32A Depression, unspecified: Secondary | ICD-10-CM | POA: Diagnosis present

## 2023-02-02 DIAGNOSIS — C20 Malignant neoplasm of rectum: Secondary | ICD-10-CM | POA: Diagnosis present

## 2023-02-02 DIAGNOSIS — Z794 Long term (current) use of insulin: Secondary | ICD-10-CM | POA: Diagnosis not present

## 2023-02-02 DIAGNOSIS — Z7984 Long term (current) use of oral hypoglycemic drugs: Secondary | ICD-10-CM

## 2023-02-02 DIAGNOSIS — E785 Hyperlipidemia, unspecified: Secondary | ICD-10-CM | POA: Diagnosis present

## 2023-02-02 DIAGNOSIS — K5641 Fecal impaction: Secondary | ICD-10-CM

## 2023-02-02 DIAGNOSIS — J189 Pneumonia, unspecified organism: Secondary | ICD-10-CM | POA: Diagnosis present

## 2023-02-02 DIAGNOSIS — D649 Anemia, unspecified: Secondary | ICD-10-CM | POA: Diagnosis not present

## 2023-02-02 DIAGNOSIS — E43 Unspecified severe protein-calorie malnutrition: Secondary | ICD-10-CM | POA: Diagnosis present

## 2023-02-02 DIAGNOSIS — Z90710 Acquired absence of both cervix and uterus: Secondary | ICD-10-CM

## 2023-02-02 DIAGNOSIS — Z66 Do not resuscitate: Secondary | ICD-10-CM | POA: Diagnosis present

## 2023-02-02 DIAGNOSIS — D689 Coagulation defect, unspecified: Secondary | ICD-10-CM | POA: Diagnosis present

## 2023-02-02 DIAGNOSIS — E1165 Type 2 diabetes mellitus with hyperglycemia: Secondary | ICD-10-CM | POA: Diagnosis present

## 2023-02-02 DIAGNOSIS — Z86718 Personal history of other venous thrombosis and embolism: Secondary | ICD-10-CM

## 2023-02-02 DIAGNOSIS — F0393 Unspecified dementia, unspecified severity, with mood disturbance: Secondary | ICD-10-CM | POA: Diagnosis present

## 2023-02-02 DIAGNOSIS — Z91013 Allergy to seafood: Secondary | ICD-10-CM

## 2023-02-02 DIAGNOSIS — Z886 Allergy status to analgesic agent status: Secondary | ICD-10-CM

## 2023-02-02 DIAGNOSIS — I69359 Hemiplegia and hemiparesis following cerebral infarction affecting unspecified side: Secondary | ICD-10-CM | POA: Diagnosis not present

## 2023-02-02 DIAGNOSIS — R627 Adult failure to thrive: Secondary | ICD-10-CM | POA: Diagnosis present

## 2023-02-02 DIAGNOSIS — R52 Pain, unspecified: Secondary | ICD-10-CM | POA: Diagnosis not present

## 2023-02-02 DIAGNOSIS — Z7901 Long term (current) use of anticoagulants: Secondary | ICD-10-CM

## 2023-02-02 DIAGNOSIS — Z96653 Presence of artificial knee joint, bilateral: Secondary | ICD-10-CM | POA: Diagnosis present

## 2023-02-02 DIAGNOSIS — Z85828 Personal history of other malignant neoplasm of skin: Secondary | ICD-10-CM

## 2023-02-02 DIAGNOSIS — E042 Nontoxic multinodular goiter: Secondary | ICD-10-CM | POA: Diagnosis present

## 2023-02-02 DIAGNOSIS — Z515 Encounter for palliative care: Secondary | ICD-10-CM

## 2023-02-02 DIAGNOSIS — I639 Cerebral infarction, unspecified: Secondary | ICD-10-CM | POA: Diagnosis present

## 2023-02-02 DIAGNOSIS — D62 Acute posthemorrhagic anemia: Secondary | ICD-10-CM | POA: Diagnosis present

## 2023-02-02 DIAGNOSIS — I63231 Cerebral infarction due to unspecified occlusion or stenosis of right carotid arteries: Secondary | ICD-10-CM | POA: Diagnosis not present

## 2023-02-02 LAB — HEMOGLOBIN A1C
Hgb A1c MFr Bld: 7.4 % — ABNORMAL HIGH (ref 4.8–5.6)
Mean Plasma Glucose: 165.68 mg/dL

## 2023-02-02 LAB — IRON AND TIBC
Iron: 15 ug/dL — ABNORMAL LOW (ref 28–170)
Saturation Ratios: 4 % — ABNORMAL LOW (ref 10.4–31.8)
TIBC: 367 ug/dL (ref 250–450)
UIBC: 352 ug/dL

## 2023-02-02 LAB — COMPREHENSIVE METABOLIC PANEL
ALT: 15 U/L (ref 0–44)
AST: 18 U/L (ref 15–41)
Albumin: 3 g/dL — ABNORMAL LOW (ref 3.5–5.0)
Alkaline Phosphatase: 57 U/L (ref 38–126)
Anion gap: 9 (ref 5–15)
BUN: 25 mg/dL — ABNORMAL HIGH (ref 8–23)
CO2: 24 mmol/L (ref 22–32)
Calcium: 9.7 mg/dL (ref 8.9–10.3)
Chloride: 109 mmol/L (ref 98–111)
Creatinine, Ser: 0.97 mg/dL (ref 0.44–1.00)
GFR, Estimated: 58 mL/min — ABNORMAL LOW (ref >60)
Glucose, Bld: 115 mg/dL — ABNORMAL HIGH (ref 70–99)
Potassium: 4.1 mmol/L (ref 3.5–5.1)
Sodium: 142 mmol/L (ref 135–145)
Total Bilirubin: 0.4 mg/dL (ref 0.0–1.2)
Total Protein: 6.6 g/dL (ref 6.5–8.1)

## 2023-02-02 LAB — PROTIME-INR
INR: 2.4 — ABNORMAL HIGH (ref 0.8–1.2)
Prothrombin Time: 26.2 s — ABNORMAL HIGH (ref 11.4–15.2)

## 2023-02-02 LAB — POC OCCULT BLOOD, ED
Fecal Occult Bld: POSITIVE — AB
Fecal Occult Bld: POSITIVE — AB

## 2023-02-02 LAB — CBC WITH DIFFERENTIAL/PLATELET
Abs Immature Granulocytes: 0.02 10*3/uL (ref 0.00–0.07)
Basophils Absolute: 0.1 10*3/uL (ref 0.0–0.1)
Basophils Relative: 1 %
Eosinophils Absolute: 0 10*3/uL (ref 0.0–0.5)
Eosinophils Relative: 0 %
HCT: 21.2 % — ABNORMAL LOW (ref 36.0–46.0)
Hemoglobin: 6.2 g/dL — CL (ref 12.0–15.0)
Immature Granulocytes: 0 %
Lymphocytes Relative: 21 %
Lymphs Abs: 2 10*3/uL (ref 0.7–4.0)
MCH: 23.3 pg — ABNORMAL LOW (ref 26.0–34.0)
MCHC: 29.2 g/dL — ABNORMAL LOW (ref 30.0–36.0)
MCV: 79.7 fL — ABNORMAL LOW (ref 80.0–100.0)
Monocytes Absolute: 0.9 10*3/uL (ref 0.1–1.0)
Monocytes Relative: 9 %
Neutro Abs: 6.6 10*3/uL (ref 1.7–7.7)
Neutrophils Relative %: 69 %
Platelets: 401 10*3/uL — ABNORMAL HIGH (ref 150–400)
RBC: 2.66 MIL/uL — ABNORMAL LOW (ref 3.87–5.11)
RDW: 15.2 % (ref 11.5–15.5)
WBC: 9.6 10*3/uL (ref 4.0–10.5)
nRBC: 0 % (ref 0.0–0.2)

## 2023-02-02 LAB — CBG MONITORING, ED
Glucose-Capillary: 69 mg/dL — ABNORMAL LOW (ref 70–99)
Glucose-Capillary: 112 mg/dL — ABNORMAL HIGH (ref 70–99)

## 2023-02-02 LAB — RETICULOCYTES
Immature Retic Fract: 15.9 % (ref 2.3–15.9)
RBC.: 2.47 MIL/uL — ABNORMAL LOW (ref 3.87–5.11)
Retic Count, Absolute: 34.1 10*3/uL (ref 19.0–186.0)
Retic Ct Pct: 1.4 % (ref 0.4–3.1)

## 2023-02-02 LAB — PREPARE RBC (CROSSMATCH)

## 2023-02-02 LAB — FERRITIN: Ferritin: 16 ng/mL (ref 11–307)

## 2023-02-02 LAB — FOLATE: Folate: 28.2 ng/mL (ref >5.9)

## 2023-02-02 LAB — VITAMIN B12: Vitamin B-12: 850 pg/mL (ref 180–914)

## 2023-02-02 MED ORDER — DEXTROSE 50 % IV SOLN
12.5000 g | INTRAVENOUS | Status: AC
  Filled 2023-02-02: qty 50

## 2023-02-02 MED ORDER — PANTOPRAZOLE SODIUM 40 MG IV SOLR
40.0000 mg | Freq: Two times a day (BID) | INTRAVENOUS | Status: DC
Start: 2023-02-02 — End: 2023-02-05
  Administered 2023-02-02 – 2023-02-05 (×6): 40 mg via INTRAVENOUS
  Filled 2023-02-02 (×5): qty 10

## 2023-02-02 MED ORDER — SODIUM CHLORIDE 0.9% IV SOLUTION: Freq: Once | INTRAVENOUS | Status: AC

## 2023-02-02 MED ORDER — INSULIN ASPART 100 UNIT/ML IJ SOLN
0.0000 [IU] | INTRAMUSCULAR | Status: DC
  Administered 2023-02-04 (×2): 2 [IU] via SUBCUTANEOUS

## 2023-02-02 MED ORDER — PANTOPRAZOLE SODIUM 40 MG IV SOLR: 40.0000 mg | Freq: Two times a day (BID) | INTRAVENOUS | Status: DC

## 2023-02-02 MED ORDER — DEXTROSE 50 % IV SOLN
12.5000 g | INTRAVENOUS | Status: AC
  Administered 2023-02-02: 12.5 g via INTRAVENOUS

## 2023-02-02 MED ORDER — DEXTROSE IN LACTATED RINGERS 5 % IV SOLN: INTRAVENOUS | Status: DC

## 2023-02-02 MED ORDER — PANTOPRAZOLE SODIUM 40 MG IV SOLR
40.0000 mg | Freq: Once | INTRAVENOUS | Status: DC
  Filled 2023-02-02: qty 10

## 2023-02-02 NOTE — H&P (Addendum)
PCP:   Copland, Gwenlyn Found, MD   Chief Complaint:  Anemia  HPI: This is a nonverbal patient secondary to prior CVA.  Her past medical history includes HTN, HLD, T2DM, severe protein calorie deficient malnutrition, anxiety and depression, history of DVT.  She is a nursing home resident.  She was sent in due to anemia.  Blood noted in patient's stool.  CBC done revealed marked anemia.  In the ER patient's vital stable.  Hemoglobin was 6.2.  Patient fecal occult blood positive with dark stool.  INR 2.47.  Chest x-ray questions metastatic cancer versus multiple myeloma.  Patient quite somnolent difficult to arouse.  Per daughter who was contacted via phone, patient mostly sleeps.  At baseline she feeds herself however that has been declining.  The daughter is not sure to what extent the decline is.  Patient nodded yes or no in response to questions at baseline.  Review of Systems:  Per HPI  Past Medical History: Past Medical History:  Diagnosis Date   Arthritis    Carotid stenosis 11/2018   Cataract    surgery to remove   Diabetes mellitus    type 2   Hyperlipidemia    Hypertension    Skin cancer    Removed from face   Stroke (HCC) 09/19/2018   Urgency of urination    Urinary leakage    Past Surgical History:  Procedure Laterality Date   ABDOMINAL HYSTERECTOMY  early 80's   total   CAROTID ENDARTERECTOMY Left 11/13/2018   CARPAL TUNNEL RELEASE Bilateral 1980 and 1981   both hands   ENDARTERECTOMY Left 11/13/2018   Procedure: Left Carotid Artery Endarterectomy with Patch Angioplasty;  Surgeon: Larina Earthly, MD;  Location: [facility] OR;  Service: Vascular;  Laterality: Left;   EYE SURGERY     bilateral cataracts   Fatty Tumor Excision     JOINT REPLACEMENT     TONSILLECTOMY     TONSILLECTOMY AND ADENOIDECTOMY  age 38   TOTAL KNEE ARTHROPLASTY Right 10/04/2015   TOTAL KNEE ARTHROPLASTY Right 10/04/2015   Procedure: TOTAL KNEE ARTHROPLASTY;  Surgeon: Jodi Geralds, MD;  Location: [facility] OR;   Service: Orthopedics;  Laterality: Right;   TOTAL KNEE ARTHROPLASTY Left 01/28/2016   Procedure: TOTAL KNEE ARTHROPLASTY;  Surgeon: Jodi Geralds, MD;  Location: [facility] OR;  Service: Orthopedics;  Laterality: Left;   TUBAL LIGATION      Medications: Prior to Admission medications   Medication Sig Start Date End Date Taking? Authorizing Provider  acetaminophen (TYLENOL) 325 MG tablet Take 650 mg by mouth in the morning and at bedtime.    [provider]  Amino Acids-Protein Hydrolys (PRO-STAT PO) Take 30 mLs by mouth daily.    [provider]  amLODipine (NORVASC) 5 MG tablet TAKE 1 TABLET(5 MG) BY MOUTH DAILY Patient taking differently: Take 5 mg by mouth daily. 03/16/21   Copland, Gwenlyn Found, MD  blood glucose meter kit and supplies Dispense based on patient and insurance preference. Pt just needs meter 02/26/17   Copland, Gwenlyn Found, MD  Blood Glucose Monitoring Suppl (ONE TOUCH ULTRA MINI) w/Device KIT Use to test blood sugar daily as instructed. Dx: E11.65 01/26/21   Copland, Gwenlyn Found, MD  ezetimibe (ZETIA) 10 MG tablet Take 1 tablet (10 mg total) by mouth daily. 01/05/22   Rai, Ripudeep K, MD  glipiZIDE (GLUCOTROL XL) 5 MG 24 hr tablet Take 2 tabs in the morning and 1 tab in the evening. Patient not taking: Reported on 05/09/2022  03/16/21   Copland, Gwenlyn Found, MD  glucose blood (ONE TOUCH ULTRA TEST) test strip Test blood sugar 3 times a day. Dx code: 250.00 03/16/21   Copland, Gwenlyn Found, MD  glucose blood (ONETOUCH ULTRA) test strip Use as instructed 06/08/21   Copland, Gwenlyn Found, MD  insulin aspart (NOVOLOG) 100 UNIT/ML injection Inject 3 Units into the skin 3 (three) times daily with meals. Patient taking differently: Inject 2-6 Units into the skin daily. Inject as per sliding scale: If 200-280= 2 units 281-320= 4 units 321-400= 6 units 07/29/21   Dahal, Melina Schools, MD  insulin glargine-yfgn (SEMGLEE) 100 UNIT/ML injection Inject 0.33 mLs (33 Units total) into the skin daily. Patient  taking differently: Inject 10 Units into the skin every evening. 01/04/22   Rai, Ripudeep K, MD  insulin glargine-yfgn (SEMGLEE, YFGN,) 100 UNIT/ML injection Inject 20 Units into the skin daily.    [provider]  Insulin Pen Needle (BD PEN NEEDLE NANO U/F) 32G X 4 MM MISC USE TO INJECT INSULIN ONCE DAILY 08/31/16   Copland, Gwenlyn Found, MD  Insulin Syringe-Needle U-100 (INSULIN SYRINGE .5CC/31GX5/16") 31G X 5/16" 0.5 ML MISC Use to inject insulin 1 time daily. 09/23/14   Carlus Pavlov, MD  Lancets MISC 1 each by Does not apply route 3 (three) times daily. Dx: E11.65 01/26/21   Copland, Gwenlyn Found, MD  magnesium oxide (MAG-OX) 400 (240 Mg) MG tablet Take 1 tablet (400 mg total) by mouth daily. 01/05/22   Rai, Delene Ruffini, MD  metoprolol tartrate (LOPRESSOR) 25 MG tablet Take 0.5 tablets (12.5 mg total) by mouth 2 (two) times daily. 01/04/22   Rai, Delene Ruffini, MD  Multiple Vitamin (MULTIVITAMIN WITH MINERALS) TABS tablet Take 1 tablet by mouth daily. 01/05/22   Rai, Ripudeep K, MD  polyethylene glycol (MIRALAX / GLYCOLAX) 17 g packet Take 17 g by mouth daily as needed for moderate constipation. 01/04/22   Rai, Delene Ruffini, MD  rivaroxaban (XARELTO) 20 MG TABS tablet Take 1 tablet (20 mg total) by mouth daily with supper. 01/04/22   Rai, Ripudeep K, MD  senna-docusate (SENOKOT-S) 8.6-50 MG tablet Take 2 tablets by mouth at bedtime. 01/04/22   Rai, Ripudeep K, MD  sitaGLIPtin (JANUVIA) 25 MG tablet Take 25 mg by mouth every morning.    [provider]  venlafaxine (EFFEXOR) 37.5 MG tablet Take 37.5 mg by mouth every morning.    [provider]  venlafaxine XR (EFFEXOR-XR) 75 MG 24 hr capsule Take 1 capsule (75 mg total) by mouth daily with breakfast. Patient not taking: Reported on 05/09/2022 01/05/22   Rai, Delene Ruffini, MD  lisinopril (PRINIVIL,ZESTRIL) 20 MG tablet Take 20 mg by mouth daily.   03/23/11  [provider]    Allergies:   Allergies  Allergen Reactions    Ace Inhibitors Diarrhea, Swelling, Other (See Comments) and Cough    Pt had cough and diarrhea with first few doses of medication; also had swelling of right eyelid. Stopped medication on 03/17/11.   Alendronate Sodium Other (See Comments)    "Made my whole body hurt"   Aspirin Hives   Crestor [Rosuvastatin] Other (See Comments)    "Made my whole body hurt"   Lipitor [Atorvastatin Calcium] Other (See Comments)    "Made my whole body hurt"   Metformin And Related     Severe abdominal pain   Shellfish Allergy Nausea Only    Intolerant of fresh shellfish, reports the reaction is GI upset, denies hives, denies any swelling  Reports that she can tolerate canned seafood.    Statins Other (See Comments)    myalgia    Social History:  reports that she has never smoked. She has never used smokeless tobacco. She reports current alcohol use of about 1.0 standard drink of alcohol per week. She reports that she does not use drugs.  Family History: Family History  Problem Relation Age of Onset   Cancer Mother        liver   Breast cancer Mother 1   Pancreatitis Father        deceased 33   Cancer Brother        GI   Coronary artery disease Paternal Grandmother    Cancer Son        terminal kidney   Breast cancer Maternal Aunt    Cancer Other    Coronary artery disease Paternal Uncle     Physical Exam: Vitals:   02/02/23 1930  BP: (!) 110/53  Pulse: 78  Resp: 16  Temp: 98.1 F (36.7 C)  TempSrc: Axillary  SpO2: 98%    General: Somnolent but arousable painful stimuli, pale, in no acute distress Eyes: Pale conjunctiva, no scleral icterus ENT: Moist oral mucosa, neck supple, no thyromegaly Lungs: CTA B/L, no wheeze, no crackles, no use of accessory muscles Cardiovascular: RRR, no regurgitation, no gallops, no murmurs. No carotid bruits, no JVD Abdomen: soft, positive BS, NTND, no organomegaly, not an acute abdomen GU: not examined Neuro: Unable to accurately assess due to  patient's nonverbal state/somnolence Musculoskeletal: Unable to accurately assess due to patient's nonverbal/somnolent state, no edema Skin: no rash, no subcutaneous crepitation, no decubitus Psych: Somnolent patient   Labs on Admission:  Recent Labs    02/02/23 1940  NA 142  K 4.1  CL 109  CO2 24  GLUCOSE 115*  BUN 25*  CREATININE 0.97  CALCIUM 9.7   Recent Labs    02/02/23 1940  AST 18  ALT 15  ALKPHOS 57  BILITOT 0.4  PROT 6.6  ALBUMIN 3.0*    Recent Labs    02/02/23 1940  WBC 9.6  NEUTROABS 6.6  HGB 6.2*  HCT 21.2*  MCV 79.7*  PLT 401*    Radiological Exams on Admission: DG Chest 1 View Result Date: 02/02/2023 CLINICAL DATA:  Sepsis EXAM: CHEST  1 VIEW COMPARISON:  12/26/2021 FINDINGS: Low lung volumes. No consolidation or effusion. Stable cardiomediastinal silhouette with aortic atherosclerosis. No pneumothorax. Interval permeative appearance of the shafts of the left greater than right humerus. IMPRESSION: 1. Low lung volumes. No active disease. 2. Interval permeative appearance of the shafts of the left greater than right humerus, appearance is suspicious for metastatic disease or marrow disease such as myeloma. Electronically Signed   By: Jasmine Pang M.D.   On: 02/02/2023 20:03    Assessment/Plan Present on Admission:  Upper GI bleed (dark stools) //  Severe anemia  Rectal mass -concerning for rectal malignancy -Anemia panel and transfusion ordered by EDP -Serial H&H -IV Protonix twice daily -N.p.o. -[facility] Dr. Jenkins Rouge contacted by EDP.  Official consult in a.m. -Patient on Xarelto.  This has been held   Abnormal CXR -Question metastatic cancer versus multiple myeloma -CT scan shows a likely rectal malignancy -Tumor markers CEA, Ca19 -9, PSA, alpha-fetoprotein ordered   Left upper lobe pneumonia //  Hypoxemia -VBG oxygen <31.  Pneumonia order set initiated.  Blood cultures x 2 ordered.  IV Zosyn.  Oxygen ordered.  Nebulizers as  needed -Ammonia level  normal.   -Aspiration precautions   Rectal impaction //  Stercoral colitis -Gentle enema ordered.  IV Zosyn.   Elevated INR  -Unclear etiology.  No liver pathology noted on CT.  No mets. -1 unit FFP given with patient's GI bleed and anemia   Multinodular goiter -Patient with multiple medical issues.  Patient with many medical issues/comorbid condition.  Defer to a.m. team discussed with daughter direction of treatment.  Ultrasound not ordered.   Pancreatic tail lesion -CA 19-9 ordered   Hyperlipidemia with target LDL less than 70 -Zetia on hold   Essential hypertension -Norvasc, lisinopril ordered   Anxiety and depression -Effexor on hold until patient's mentation improved   T2DM -Sliding scale insulin every 4 hours ordered -Patient on Lantus 20 units every morning, 33 units q. of sleep home dose   History of DVT -On Xarelto.  This has been held   Protein-calorie malnutrition, severe -N.p.o.   History of stroke (cerebrum) (HCC) patient nonverbal as a result  Johann Gascoigne 02/02/2023, 8:45 PM

## 2023-02-02 NOTE — ED Notes (Signed)
Fecal occult sample taken to lab - positive results; Dr. Joneen Roach made aware

## 2023-02-02 NOTE — ED Notes (Signed)
Family member contacted (daughter Olegario Messier). Daughter, Olegario Messier gives verbal consent for patient to receive a blood transfusion.

## 2023-02-02 NOTE — ED Notes (Signed)
Patient had incontinent BM (brown, large). Peri care and brief change

## 2023-02-02 NOTE — ED Notes (Signed)
Bgl 69; Dr. Joneen Roach made aware

## 2023-02-02 NOTE — ED Provider Notes (Signed)
[name] EMERGENCY DEPARTMENT AT [facility] Provider Note   CSN: [id number] Arrival date & time: 02/02/23  1922     History  Chief Complaint  Patient presents with   Abnormal Lab    Wanda [name] is a 83 y.o. female.  Pt is a 83 yo female with pmhx significant for CVA with hemiplegia and dysphagia, HTN, HLD, DVT (on Xarelto), and DM2.  Pt presents from her facility with low hemoglobin.  Pt is unable to give any hx.  Information obtained by EMS and daughter.       Home Medications Prior to Admission medications   Medication Sig Start Date End Date Taking? Authorizing Provider  acetaminophen (TYLENOL) 325 MG tablet Take 650 mg by mouth in the morning and at bedtime.    [provider]  Amino Acids-Protein Hydrolys (PRO-STAT PO) Take 30 mLs by mouth daily.    [provider]  amLODipine (NORVASC) 5 MG tablet TAKE 1 TABLET(5 MG) BY MOUTH DAILY Patient taking differently: Take 5 mg by mouth daily. 03/16/21   Copland, Gwenlyn Found, MD  blood glucose meter kit and supplies Dispense based on patient and insurance preference. Pt just needs meter 02/26/17   Copland, Gwenlyn Found, MD  Blood Glucose Monitoring Suppl (ONE TOUCH ULTRA MINI) w/Device KIT Use to test blood sugar daily as instructed. Dx: E11.65 01/26/21   Copland, Gwenlyn Found, MD  ezetimibe (ZETIA) 10 MG tablet Take 1 tablet (10 mg total) by mouth daily. 01/05/22   Rai, Ripudeep K, MD  glipiZIDE (GLUCOTROL XL) 5 MG 24 hr tablet Take 2 tabs in the morning and 1 tab in the evening. Patient not taking: Reported on 05/09/2022 03/16/21   Copland, Gwenlyn Found, MD  glucose blood (ONE TOUCH ULTRA TEST) test strip Test blood sugar 3 times a day. Dx code: 250.00 03/16/21   Copland, Gwenlyn Found, MD  glucose blood (ONETOUCH ULTRA) test strip Use as instructed 06/08/21   Copland, Gwenlyn Found, MD  insulin aspart (NOVOLOG) 100 UNIT/ML injection Inject 3 Units into the skin 3 (three) times daily with meals. Patient taking  differently: Inject 2-6 Units into the skin daily. Inject as per sliding scale: If 200-280= 2 units 281-320= 4 units 321-400= 6 units 07/29/21   Dahal, Melina Schools, MD  insulin glargine-yfgn (SEMGLEE) 100 UNIT/ML injection Inject 0.33 mLs (33 Units total) into the skin daily. Patient taking differently: Inject 10 Units into the skin every evening. 01/04/22   Rai, Ripudeep K, MD  insulin glargine-yfgn (SEMGLEE, YFGN,) 100 UNIT/ML injection Inject 20 Units into the skin daily.    [provider]  Insulin Pen Needle (BD PEN NEEDLE NANO U/F) 32G X 4 MM MISC USE TO INJECT INSULIN ONCE DAILY 08/31/16   Copland, Gwenlyn Found, MD  Insulin Syringe-Needle U-100 (INSULIN SYRINGE .5CC/31GX5/16") 31G X 5/16" 0.5 ML MISC Use to inject insulin 1 time daily. 09/23/14   Carlus Pavlov, MD  Lancets MISC 1 each by Does not apply route 3 (three) times daily. Dx: E11.65 01/26/21   Copland, Gwenlyn Found, MD  magnesium oxide (MAG-OX) 400 (240 Mg) MG tablet Take 1 tablet (400 mg total) by mouth daily. 01/05/22   Rai, Delene Ruffini, MD  metoprolol tartrate (LOPRESSOR) 25 MG tablet Take 0.5 tablets (12.5 mg total) by mouth 2 (two) times daily. 01/04/22   Rai, Delene Ruffini, MD  Multiple Vitamin (MULTIVITAMIN WITH MINERALS) TABS tablet Take 1 tablet by mouth daily. 01/05/22   Rai, Delene Ruffini, MD  polyethylene glycol [facility] /  GLYCOLAX) 17 g packet Take 17 g by mouth daily as needed for moderate constipation. 01/04/22   Rai, Delene Ruffini, MD  rivaroxaban (XARELTO) 20 MG TABS tablet Take 1 tablet (20 mg total) by mouth daily with supper. 01/04/22   Rai, Ripudeep K, MD  senna-docusate (SENOKOT-S) 8.6-50 MG tablet Take 2 tablets by mouth at bedtime. 01/04/22   Rai, Ripudeep K, MD  sitaGLIPtin (JANUVIA) 25 MG tablet Take 25 mg by mouth every morning.    [provider]  venlafaxine (EFFEXOR) 37.5 MG tablet Take 37.5 mg by mouth every morning.    [provider]  venlafaxine XR (EFFEXOR-XR) 75 MG 24 hr capsule Take 1  capsule (75 mg total) by mouth daily with breakfast. Patient not taking: Reported on 05/09/2022 01/05/22   Rai, Delene Ruffini, MD  lisinopril (PRINIVIL,ZESTRIL) 20 MG tablet Take 20 mg by mouth daily.   03/23/11  [provider]      Allergies    Ace inhibitors, Alendronate sodium, Aspirin, Crestor [rosuvastatin], Lipitor [atorvastatin calcium], Metformin and related, Shellfish allergy, and Statins    Review of Systems   Review of Systems  Unable to perform ROS: Patient nonverbal    Physical Exam Updated Vital Signs BP (!) 110/53 (BP Location: Right Arm)   Pulse 78   Temp 98.1 F (36.7 C) (Axillary)   Resp 16   LMP  (LMP Unknown)   SpO2 98%  Physical Exam Vitals and nursing note reviewed.  Constitutional:      General: She is sleeping.  HENT:     Head: Normocephalic and atraumatic.     Right Ear: External ear normal.     Left Ear: External ear normal.     Nose: Nose normal.     Mouth/Throat:     Mouth: Mucous membranes are dry.  Eyes:     Extraocular Movements: Extraocular movements intact.     Conjunctiva/sclera: Conjunctivae normal.     Pupils: Pupils are equal, round, and reactive to light.  Cardiovascular:     Rate and Rhythm: Normal rate and regular rhythm.     Pulses: Normal pulses.     Heart sounds: Normal heart sounds.  Pulmonary:     Effort: Pulmonary effort is normal.     Breath sounds: Normal breath sounds.  Abdominal:     General: Abdomen is flat. Bowel sounds are normal.     Palpations: Abdomen is soft.  Genitourinary:    Rectum: Guaiac result positive.  Musculoskeletal:        General: Normal range of motion.     Cervical back: Normal range of motion and neck supple.  Skin:    General: Skin is warm.     Capillary Refill: Capillary refill takes less than 2 seconds.  Neurological:     Mental Status: Mental status is at baseline.     ED Results / Procedures / Treatments   Labs (all labs ordered are listed, but only abnormal results are  displayed) Labs Reviewed  COMPREHENSIVE METABOLIC PANEL - Abnormal; Notable for the following components:      Result Value   Glucose, Bld 115 (*)    BUN 25 (*)    Albumin 3.0 (*)    GFR, Estimated 58 (*)    All other components within normal limits  CBC WITH DIFFERENTIAL/PLATELET - Abnormal; Notable for the following components:   RBC 2.66 (*)    Hemoglobin 6.2 (*)    HCT 21.2 (*)    MCV 79.7 (*)  MCH 23.3 (*)    MCHC 29.2 (*)    Platelets 401 (*)    All other components within normal limits  PROTIME-INR - Abnormal; Notable for the following components:   Prothrombin Time 26.2 (*)    INR 2.4 (*)    All other components within normal limits  POC OCCULT BLOOD, ED - Abnormal; Notable for the following components:   Fecal Occult Bld POSITIVE (*)    All other components within normal limits  MULTIPLE MYELOMA PANEL, SERUM  VITAMIN B12  FOLATE  IRON AND TIBC  FERRITIN  RETICULOCYTES  TYPE AND SCREEN  PREPARE RBC (CROSSMATCH)    EKG None  Radiology DG Chest 1 View Result Date: 02/02/2023 CLINICAL DATA:  Sepsis EXAM: CHEST  1 VIEW COMPARISON:  12/26/2021 FINDINGS: Low lung volumes. No consolidation or effusion. Stable cardiomediastinal silhouette with aortic atherosclerosis. No pneumothorax. Interval permeative appearance of the shafts of the left greater than right humerus. IMPRESSION: 1. Low lung volumes. No active disease. 2. Interval permeative appearance of the shafts of the left greater than right humerus, appearance is suspicious for metastatic disease or marrow disease such as myeloma. Electronically Signed   By: Jasmine Pang M.D.   On: 02/02/2023 20:03    Procedures Procedures    Medications Ordered in ED Medications  0.9 %  sodium chloride infusion (Manually program via Guardrails IV Fluids) (has no administration in time range)  pantoprazole (PROTONIX) injection 40 mg (has no administration in time range)    ED Course/ Medical Decision Making/ A&P                                  Medical Decision Making Amount and/or Complexity of Data Reviewed Labs: ordered. Radiology: ordered.  Risk Prescription drug management. Decision regarding hospitalization.   This patient presents to the ED for concern of anemia, this involves an extensive number of treatment options, and is a complaint that carries with it a high risk of complications and morbidity.  The differential diagnosis includes gi bleed, other source of bleeding   Co morbidities that complicate the patient evaluation  CVA with hemiplegia and dysphagia, HTN, HLD, DVT (on Xarelto), and DM2   Additional history obtained:  Additional history obtained from epic chart review External records from outside source obtained and reviewed including EMS report/daughter   Lab Tests:  I Ordered, and personally interpreted labs.  The pertinent results include:  cbc with hgb low at 6.2; inr elevated at 2.4, cmp nl   Imaging Studies ordered:  I ordered imaging studies including cxr  I independently visualized and interpreted imaging which showed  Low lung volumes. No active disease.  2. Interval permeative appearance of the shafts of the left greater  than right humerus, appearance is suspicious for metastatic disease  or marrow disease such as myeloma.   I agree with the radiologist interpretation   Cardiac Monitoring:  The patient was maintained on a cardiac monitor.  I personally viewed and interpreted the cardiac monitored which showed an underlying rhythm of: nsr   Medicines ordered and prescription drug management:  I ordered medication including protonix/blood  for sx  Reevaluation of the patient after these medicines showed that the patient improved I have reviewed the patients home medicines and have made adjustments as needed   Test Considered:  ct   Critical Interventions:  transfusion   Consultations Obtained:  I requested consultation with [facility] (Dr.  Cirgliano),  and discussed lab and imaging findings as well as pertinent plan -he recommended ppi and anemia panel.  He will see in am. Pt d/w Dr. Joneen Roach ([facility]).  She will admit.  Problem List / ED Course:  GI bleed:  likely upper.  Stool is dark and guaiac +.  She has never seen GI.  I sent a secure chat to GI to see in am.  PPI ordered.  Anemia panel ordered Abnormal bones on CXR:  mm panel ordered Xarelto use:  will need to hold.   Reevaluation:  After the interventions noted above, I reevaluated the patient and found that they have :improved   Social Determinants of Health:  Lives in SNF   Dispostion:  After consideration of the diagnostic results and the patients response to treatment, I feel that the patent would benefit from admission.  CRITICAL CARE Performed by: Jacalyn Lefevre   Total critical care time: 30 minutes  Critical care time was exclusive of separately billable procedures and treating other patients.  Critical care was necessary to treat or prevent imminent or life-threatening deterioration.  Critical care was time spent personally by me on the following activities: development of treatment plan with patient and/or surrogate as well as nursing, discussions with consultants, evaluation of patient's response to treatment, examination of patient, obtaining history from patient or surrogate, ordering and performing treatments and interventions, ordering and review of laboratory studies, ordering and review of radiographic studies, pulse oximetry and re-evaluation of patient's condition.           Final Clinical Impression(s) / ED Diagnoses Final diagnoses:  Symptomatic anemia  On rivaroxaban therapy  Upper GI bleed    Rx / DC Orders ED Discharge Orders     None         Jacalyn Lefevre, MD 02/02/23 2043

## 2023-02-02 NOTE — ED Notes (Signed)
Dr. Particia Nearing made aware of critical lab value - hgb 6.2

## 2023-02-02 NOTE — ED Triage Notes (Signed)
BIBA per EMS pt coming from [facility] w/ c/o low hgb of 5.7 per facility.. VSS. Hx dementia.

## 2023-02-03 ENCOUNTER — Encounter (HOSPITAL_COMMUNITY): Payer: Self-pay | Admitting: Family Medicine

## 2023-02-03 ENCOUNTER — Inpatient Hospital Stay (HOSPITAL_COMMUNITY): Payer: Medicare Other

## 2023-02-03 DIAGNOSIS — K922 Gastrointestinal hemorrhage, unspecified: Secondary | ICD-10-CM

## 2023-02-03 DIAGNOSIS — D649 Anemia, unspecified: Secondary | ICD-10-CM | POA: Diagnosis not present

## 2023-02-03 DIAGNOSIS — K5641 Fecal impaction: Secondary | ICD-10-CM | POA: Diagnosis not present

## 2023-02-03 LAB — BLOOD GAS, VENOUS
Acid-Base Excess: 1.7 mmol/L (ref 0.0–2.0)
Bicarbonate: 27.2 mmol/L (ref 20.0–28.0)
O2 Saturation: 50.7 %
Patient temperature: 36.7
pCO2, Ven: 44 mm[Hg] (ref 44–60)
pH, Ven: 7.39 (ref 7.25–7.43)
pO2, Ven: <31 mm[Hg] — CL (ref 32–45)

## 2023-02-03 LAB — TYPE AND SCREEN
ABO/RH(D): B POS
Antibody Screen: NEGATIVE
Unit division: 0

## 2023-02-03 LAB — CBG MONITORING, ED
Glucose-Capillary: 318 mg/dL — ABNORMAL HIGH (ref 70–99)
Glucose-Capillary: 91 mg/dL (ref 70–99)
Glucose-Capillary: 82 mg/dL (ref 70–99)
Glucose-Capillary: 67 mg/dL — ABNORMAL LOW (ref 70–99)

## 2023-02-03 LAB — HEMOGLOBIN AND HEMATOCRIT, BLOOD
HCT: 26.1 % — ABNORMAL LOW (ref 36.0–46.0)
HCT: 23.8 % — ABNORMAL LOW (ref 36.0–46.0)
Hemoglobin: 8.2 g/dL — ABNORMAL LOW (ref 12.0–15.0)
Hemoglobin: 7.6 g/dL — ABNORMAL LOW (ref 12.0–15.0)

## 2023-02-03 LAB — BPAM RBC
Blood Product Expiration Date: 202501312359
ISSUE DATE / TIME: 202501242101
Unit Type and Rh: 7300

## 2023-02-03 LAB — GLUCOSE, CAPILLARY
Glucose-Capillary: 86 mg/dL (ref 70–99)
Glucose-Capillary: 72 mg/dL (ref 70–99)

## 2023-02-03 LAB — AMMONIA: Ammonia: 27 umol/L (ref 9–35)

## 2023-02-03 LAB — PSA: Prostatic Specific Antigen: <0.01 ng/mL (ref 0.00–4.00)

## 2023-02-03 MED ORDER — LACTULOSE ENEMA: 300.0000 mL | Freq: Once | ORAL | Status: DC

## 2023-02-03 MED ORDER — PIPERACILLIN-TAZOBACTAM 3.375 G IVPB
3.3750 g | Freq: Three times a day (TID) | INTRAVENOUS | Status: DC
  Filled 2023-02-03: qty 50

## 2023-02-03 MED ORDER — SMOG ENEMA
400.0000 mL | Freq: Two times a day (BID) | RECTAL | Status: AC
  Administered 2023-02-03: 400 mL via RECTAL
  Filled 2023-02-03 (×2): qty 960

## 2023-02-03 MED ORDER — SODIUM CHLORIDE 0.9% IV SOLUTION: Freq: Once | INTRAVENOUS | Status: AC

## 2023-02-03 MED ORDER — IOHEXOL 350 MG/ML SOLN
75.0000 mL | Freq: Once | INTRAVENOUS | Status: AC | PRN
  Administered 2023-02-03: 75 mL via INTRAVENOUS

## 2023-02-03 MED ORDER — DEXTROSE 50 % IV SOLN
12.5000 g | INTRAVENOUS | Status: DC | PRN
  Administered 2023-02-03 (×2): 12.5 g via INTRAVENOUS
  Filled 2023-02-03: qty 50

## 2023-02-03 MED ORDER — POLYETHYLENE GLYCOL 3350 17 G PO PACK
17.0000 g | PACK | Freq: Two times a day (BID) | ORAL | Status: DC
  Administered 2023-02-03 – 2023-02-05 (×5): 17 g via ORAL
  Filled 2023-02-03 (×6): qty 1

## 2023-02-03 MED ORDER — PIPERACILLIN-TAZOBACTAM 3.375 G IVPB
3.3750 g | Freq: Three times a day (TID) | INTRAVENOUS | Status: DC
  Administered 2023-02-03 – 2023-02-04 (×4): 3.375 g via INTRAVENOUS
  Filled 2023-02-03 (×4): qty 50

## 2023-02-03 MED ORDER — MILK AND MOLASSES ENEMA
1.0000 | Freq: Once | RECTAL | Status: DC
  Filled 2023-02-03: qty 240

## 2023-02-03 MED ORDER — PIPERACILLIN-TAZOBACTAM 3.375 G IVPB 30 MIN
3.3750 g | Freq: Once | INTRAVENOUS | Status: AC
  Administered 2023-02-03: 3.375 g via INTRAVENOUS

## 2023-02-03 MED ORDER — SODIUM CHLORIDE 0.9 % IV SOLN: INTRAVENOUS | Status: DC

## 2023-02-03 NOTE — Progress Notes (Signed)
PROGRESS NOTE    Wanda [name]  KVQ:[id number] DOB: 03-Feb-1940 DOA: 02/02/2023 PCP: Pearline Cables, MD   Brief Narrative:  83 year old female with history of hypertension, hyperlipidemia, diabetes mellitus type 2, severe protein calorie malnutrition, anxiety and depression, DVT on Xarelto, unspecified prior CVA with hemiplegia and nonverbal state was sent from her facility because of anemia and apparently blood was noted in her stool.  On presentation, hemoglobin was 6.2 with positive fecal occult blood and INR of 2.47.  Chest x-ray showed questionable metastatic cancer versus myeloma.  She was started on IV Protonix.  Packed red cell transfusion was ordered.  GI consulted.  Assessment & Plan:   Probable GI bleed presenting with acute blood loss anemia -Presented with hemoglobin of 6.2 with fecal occult blood testing positive.  Unclear if this is all from subacute GI blood loss versus underlying hematologic disorder/myeloma -Status post 1 unit packed red cell transfusion.  Hemoglobin 7.6 this morning.  Monitor H&H.  Continue Protonix IV awaiting GI recommendations. -Xarelto on hold  Left upper lobe pneumonia -CT chest showing patchy groundglass opacities in the left upper lobe.  Currently on room air.  Continue IV antibiotics.  Aspiration precautions.  SLP evaluation once patient is allowed to eat  Fecal impaction and concern for rectal malignancy versus stercoral colitis -As seen on imaging.  Follow GI recommendations.  No evidence of metastatic disease in the chest abdomen or pelvis. -Continue MiraLAX.  GI is ordering smog enema  Coagulopathy?  Secondary to Xarelto use -INR 2.4 on presentation.  Status post 2 units FFP's transfusion  Multinodular goiter -Seen on CT of chest.  Outpatient follow-up  Lesions in the pancreatic body and tail -As seen on CT: Might represent sidebranch IPMNs.  Recommended outpatient MRI of abdomen imaging here.  Follow GI  recommendations  Essential hypertension Hyperlipidemia -Monitor blood pressure.  Use IV antihypertensives if needed.  Zetia on hold  Anxiety and depression -Effexor on hold  Diabetes mellitus type 2 with hyperglycemia and hypoglycemia -A1c 7.4.  Continue CBGs with SSI  History of DVT -Xarelto on hold  Severe protein calorie malnutrition -Consult nutrition once started on diet  History of unspecified stroke with residual hemiplegia and nonverbal state Goals of care -Fall precautions.  Palliative care consultation for goals of care discussion  DVT prophylaxis: SCDs Code Status: DNR as per documentation sent from facility Family Communication: None at bedside Disposition Plan: Status is: Inpatient Remains inpatient appropriate because: Of severity of illness  Consultants: GI/palliative care  Procedures: None  Antimicrobials: Zosyn from 02/02/2023 onwards   Subjective: Patient seen and examined at bedside.  Wakes up only very slightly, nonverbal.  No fever, seizures, agitation reported.  Objective: Vitals:   02/03/23 0815 02/03/23 0830 02/03/23 0900 02/03/23 0945  BP: (!) 112/57 (!) 132/59 (!) 159/61 (!) 117/51  Pulse: 72 72 82 72  Resp: 19 17 18 19   Temp:   (!) 97.4 F (36.3 C)   TempSrc:   Axillary   SpO2: 100% 100% 100% 100%  Weight:      Height:        Intake/Output Summary (Last 24 hours) at 02/03/2023 1121 Last data filed at 02/03/2023 0817 Gross per 24 hour  Intake 1280.14 ml  Output --  Net 1280.14 ml   Filed Weights   02/03/23 0200  Weight: 65.8 kg    Examination:  General exam: Appears calm and comfortable.  Chronically ill and deconditioned looking.  On room air. Respiratory system: Bilateral decreased breath sounds  at bases Cardiovascular system: S1 & S2 heard, Rate controlled Gastrointestinal system: Abdomen is nondistended, soft and nontender. Normal bowel sounds heard. Extremities: No cyanosis, clubbing, edema  Central nervous system:  Wakes up only very slightly, nonverbal.  Skin: No rashes, lesions or ulcers Psychiatry: Affect is flat.  Not agitated.  Data Reviewed: I have personally reviewed following labs and imaging studies  CBC: Recent Labs  Lab 02/02/23 1940 02/03/23 0109 02/03/23 0939  WBC 9.6  --   --   NEUTROABS 6.6  --   --   HGB 6.2* 8.2* 7.6*  HCT 21.2* 26.1* 23.8*  MCV 79.7*  --   --   PLT 401*  --   --    Basic Metabolic Panel: Recent Labs  Lab 02/02/23 1940  NA 142  K 4.1  CL 109  CO2 24  GLUCOSE 115*  BUN 25*  CREATININE 0.97  CALCIUM 9.7   GFR: Estimated Creatinine Clearance: 40.1 mL/min (by C-G formula based on SCr of 0.97 mg/dL). Liver Function Tests: Recent Labs  Lab 02/02/23 1940  AST 18  ALT 15  ALKPHOS 57  BILITOT 0.4  PROT 6.6  ALBUMIN 3.0*   No results for input(s): "LIPASE", "AMYLASE" in the last 168 hours. Recent Labs  Lab 02/03/23 0030  AMMONIA 27   Coagulation Profile: Recent Labs  Lab 02/02/23 1940  INR 2.4*   Cardiac Enzymes: No results for input(s): "CKTOTAL", "CKMB", "CKMBINDEX", "TROPONINI" in the last 168 hours. BNP (last 3 results) No results for input(s): "PROBNP" in the last 8760 hours. HbA1C: Recent Labs    02/02/23 2140  HGBA1C 7.4*   CBG: Recent Labs  Lab 02/02/23 2224 02/02/23 2251 02/03/23 0226 02/03/23 0419 02/03/23 0748  GLUCAP 69* 112* 318* 91 82   Lipid Profile: No results for input(s): "CHOL", "HDL", "LDLCALC", "TRIG", "CHOLHDL", "LDLDIRECT" in the last 72 hours. Thyroid Function Tests: No results for input(s): "TSH", "T4TOTAL", "FREET4", "T3FREE", "THYROIDAB" in the last 72 hours. Anemia Panel: Recent Labs    02/02/23 2140  VITAMINB12 850  FOLATE 28.2  FERRITIN 16  TIBC 367  IRON 15*  RETICCTPCT 1.4   Sepsis Labs: No results for input(s): "PROCALCITON", "LATICACIDVEN" in the last 168 hours.  Recent Results (from the past 240 hours)  Culture, blood (Routine X 2) w Reflex to ID Panel     Status: None  (Preliminary result)   Collection Time: 02/03/23  3:21 AM   Specimen: BLOOD  Result Value Ref Range Status   Specimen Description BLOOD BLOOD LEFT HAND  Final   Special Requests   Final    BOTTLES DRAWN AEROBIC AND ANAEROBIC Blood Culture adequate volume   Culture   Final    NO GROWTH < 12 HOURS Performed at [facility], [address], [place], [place] [address]    Report Status PENDING  Incomplete  Culture, blood (Routine X 2) w Reflex to ID Panel     Status: None (Preliminary result)   Collection Time: 02/03/23  3:21 AM   Specimen: BLOOD  Result Value Ref Range Status   Specimen Description BLOOD BLOOD LEFT ARM  Final   Special Requests   Final    BOTTLES DRAWN AEROBIC AND ANAEROBIC Blood Culture adequate volume   Culture   Final    NO GROWTH < 12 HOURS Performed at [facility], [address] [place], [place] [address]    Report Status PENDING  Incomplete         Radiology  Studies: CT CHEST ABDOMEN PELVIS W CONTRAST Result Date: 02/03/2023 CLINICAL DATA:  Metastatic colon cancer staging. EXAM: CT CHEST, ABDOMEN, AND PELVIS WITH CONTRAST TECHNIQUE: Multidetector CT imaging of the chest, abdomen and pelvis was performed following the standard protocol during bolus administration of intravenous contrast. RADIATION DOSE REDUCTION: This exam was performed according to the departmental dose-optimization program which includes automated exposure control, adjustment of the mA and/or kV according to patient size and/or use of iterative reconstruction technique. CONTRAST:  75mL OMNIPAQUE IOHEXOL 350 MG/ML SOLN COMPARISON:  CT abdomen and pelvis 11/30/2021. FINDINGS: CT CHEST FINDINGS Cardiovascular: No significant vascular findings. Normal heart size. No pericardial effusion. There are atherosclerotic calcifications of the aorta. Mediastinum/Nodes: The thyroid gland is heterogeneous and enlarged containing numerous nodules. The largest nodular densities in the isthmus  measuring up to 2.8 cm. There are no enlarged mediastinal, hilar or axillary lymph nodes. Visualized esophagus is within normal limits. Lungs/Pleura: There is atelectasis in the bilateral lower lobes, right greater than left. Patchy ground-glass opacities in the left upper lobe are nonspecific. There is no pleural effusion or pneumothorax. There some secretions in the trachea. Musculoskeletal: No acute osseous findings. No suspicious osseous lesion identified. T11 vertebral body hemangioma is present. CT ABDOMEN PELVIS FINDINGS Hepatobiliary: No focal liver abnormality is seen. No gallstones, gallbladder wall thickening, or biliary dilatation. Pancreas: Low-attenuation lesions are seen in the body and tail of the pancreas measuring up to 1 cm. These have mildly increased in size in the tail of the pancreas. There is no pancreatic ductal dilatation or surrounding inflammation. Spleen: Normal in size without focal abnormality. Adrenals/Urinary Tract: Adrenal glands are unremarkable. There is no hydronephrosis. There is a left renal cyst measuring 15 mm. Bladder is unremarkable. Stomach/Bowel: There circumferential distal rectal wall thickening. The rectum is dilated and stool-filled with mild surrounding inflammation. No dilated bowel loops are seen. The appendix is within normal limits. Small bowel and stomach are within normal limits. Vascular/Lymphatic: Aortic atherosclerosis. No enlarged abdominal or pelvic lymph nodes. Reproductive: Status post hysterectomy. No adnexal masses. Other: There is presacral edema. There is no ascites or focal abdominal wall hernia. There is mild body wall edema. Musculoskeletal: No acute fracture or focal osseous lesion identified. The bones are diffusely osteopenic. IMPRESSION: 1. Circumferential distal rectal wall thickening with surrounding inflammation and presacral edema. Findings are suspicious for rectal malignancy. 2. There is rectal fecal impaction and findings suspicious for  stercoral colitis. 3. No evidence for metastatic disease in the chest, abdomen or pelvis. 4. Patchy ground-glass opacities in the left upper lobe are nonspecific and may be infectious/inflammatory. 5. Low-attenuation lesions in the body and tail of the pancreas measuring up to 1 cm. These have mildly increased in size in the tail of the pancreas. Findings may represent side branch IPMNs. Recommend follow-up MRI in 1 year. 6. Multinodular goiter.  Recommend nonemergent thyroid ultrasound. 7. Mild body wall edema. 8. Left Bosniak I benign renal cyst measuring 1.5 cm. No follow-up imaging is recommended. JACR 2018 Feb; 264-273, Management of the Incidental Renal Mass on CT, [employer] 2021; [id number], Bosniak Classification of Cystic Renal Masses, Version 2019. Aortic Atherosclerosis (ICD10-I70.0). Electronically Signed   By: Darliss Cheney M.D.   On: 02/03/2023 02:17   DG Chest 1 View Result Date: 02/02/2023 CLINICAL DATA:  Sepsis EXAM: CHEST  1 VIEW COMPARISON:  12/26/2021 FINDINGS: Low lung volumes. No consolidation or effusion. Stable cardiomediastinal silhouette with aortic atherosclerosis. No pneumothorax. Interval permeative appearance of the shafts of the left greater than  right humerus. IMPRESSION: 1. Low lung volumes. No active disease. 2. Interval permeative appearance of the shafts of the left greater than right humerus, appearance is suspicious for metastatic disease or marrow disease such as myeloma. Electronically Signed   By: Jasmine Pang M.D.   On: 02/02/2023 20:03        Scheduled Meds:  dextrose  12.5 g Intravenous STAT   insulin aspart  0-15 Units Subcutaneous Q4H   milk and molasses  1 enema Rectal Once   pantoprazole (PROTONIX) IV  40 mg Intravenous Q12H   polyethylene glycol  17 g Oral BID   SMOG  400 mL Rectal BID   Continuous Infusions:  piperacillin-tazobactam (ZOSYN)  IV            Glade Lloyd, MD [facility] 02/03/2023, 11:21 AM

## 2023-02-03 NOTE — ED Notes (Signed)
Still receiving plasma at 0544 H&H can be drawn 2 hours after completed

## 2023-02-03 NOTE — Plan of Care (Signed)

## 2023-02-03 NOTE — ED Notes (Signed)
Patients CBG is 67, Wanda Lloyd, MD notified

## 2023-02-03 NOTE — ED Notes (Signed)
Patient left the floor in stable condition, with her family and staff.

## 2023-02-03 NOTE — Consult Note (Addendum)
Consultation  Referring Provider: [facility]/ Hanley Ben Primary Care Physician:  Pearline Cables, MD Primary Gastroenterologist:  unassigned  Reason for Consultation: Anemia, heme positive stool abnormal CT suggestive of possible rectal malignancy versus stercoral ulceration  HPI: Wanda [name] is a 83 y.o. female, nursing home resident with history of prior CVA with hemiplegia, and nonverbal state.  Also with history of adult onset diabetes mellitus, hypertension, prior DVT and on chronic Xarelto. Labs done per nursing home showed patient have a hemoglobin of 6.2 yesterday and she was brought to the emergency room. Most recent hemoglobin visible was 12 in April 2024  Patient unable to offer any history. Chest x-ray shows interval permeative appearance of the shafts of the left greater then right humerus suspicious for metastatic disease or marrow disease such as myeloma  CT chest abdomen pelvis with contrast-shows circumferential distal rectal wall thickening with surrounding inflammation and presacral edema concern for rectal malignancy however there is also a fecal impaction and possible stercoral colitis.  No evidence of metastatic disease otherwise there are groundglass opacities left upper lobe rule out infectious versus inflammatory and low-attenuation lesions in the body and tail the pancreas measuring up to 1 cm probable sidebranch IPMN's.  Noted multinodular goiter  Labs 02/02/2023 hemoglobin 6.2/hematocrit 21.2/MCV 79.7/platelets 401 INR 2.4/pro time 26.2 Myeloma panel is pending Potassium 4.1/BUN 25/creatinine 0.97 LFTs within normal limits Ferritin 16/serum iron 15/TIBC 367/iron sat 4 Multiple tumor markers ordered and pending  She has been transfused and hemoglobin up to 8.2 this morning Documented heme positive   Past Medical History:  Diagnosis Date   Arthritis    Carotid stenosis 11/2018   Cataract    surgery to remove   Diabetes mellitus    type 2    Hyperlipidemia    Hypertension    Skin cancer    Removed from face   Stroke (HCC) 09/19/2018   Urgency of urination    Urinary leakage     Past Surgical History:  Procedure Laterality Date   ABDOMINAL HYSTERECTOMY  early 80's   total   CAROTID ENDARTERECTOMY Left 11/13/2018   CARPAL TUNNEL RELEASE Bilateral 1980 and 1981   both hands   ENDARTERECTOMY Left 11/13/2018   Procedure: Left Carotid Artery Endarterectomy with Patch Angioplasty;  Surgeon: Larina Earthly, MD;  Location: [facility] OR;  Service: Vascular;  Laterality: Left;   EYE SURGERY     bilateral cataracts   Fatty Tumor Excision     JOINT REPLACEMENT     TONSILLECTOMY     TONSILLECTOMY AND ADENOIDECTOMY  age 85   TOTAL KNEE ARTHROPLASTY Right 10/04/2015   TOTAL KNEE ARTHROPLASTY Right 10/04/2015   Procedure: TOTAL KNEE ARTHROPLASTY;  Surgeon: Jodi Geralds, MD;  Location: [facility] OR;  Service: Orthopedics;  Laterality: Right;   TOTAL KNEE ARTHROPLASTY Left 01/28/2016   Procedure: TOTAL KNEE ARTHROPLASTY;  Surgeon: Jodi Geralds, MD;  Location: [facility] OR;  Service: Orthopedics;  Laterality: Left;   TUBAL LIGATION      Prior to Admission medications   Medication Sig Start Date End Date Taking? Authorizing Provider  acetaminophen (TYLENOL) 325 MG tablet Take 650 mg by mouth in the morning and at bedtime.    [provider]  Amino Acids-Protein Hydrolys (PRO-STAT PO) Take 30 mLs by mouth daily.    [provider]  amLODipine (NORVASC) 5 MG tablet TAKE 1 TABLET(5 MG) BY MOUTH DAILY Patient taking differently: Take 5 mg by mouth daily. 03/16/21   Copland, Gwenlyn Found, MD  blood glucose meter kit and supplies Dispense based on patient and insurance preference. Pt just needs meter 02/26/17   Copland, Gwenlyn Found, MD  Blood Glucose Monitoring Suppl (ONE TOUCH ULTRA MINI) w/Device KIT Use to test blood sugar daily as instructed. Dx: E11.65 01/26/21   Copland, Gwenlyn Found, MD  ezetimibe (ZETIA) 10 MG tablet Take 1 tablet (10 mg total) by mouth  daily. 01/05/22   Rai, Ripudeep K, MD  glipiZIDE (GLUCOTROL XL) 5 MG 24 hr tablet Take 2 tabs in the morning and 1 tab in the evening. Patient not taking: Reported on 05/09/2022 03/16/21   Copland, Gwenlyn Found, MD  glucose blood (ONE TOUCH ULTRA TEST) test strip Test blood sugar 3 times a day. Dx code: 250.00 03/16/21   Copland, Gwenlyn Found, MD  glucose blood (ONETOUCH ULTRA) test strip Use as instructed 06/08/21   Copland, Gwenlyn Found, MD  insulin aspart (NOVOLOG) 100 UNIT/ML injection Inject 3 Units into the skin 3 (three) times daily with meals. Patient taking differently: Inject 2-6 Units into the skin daily. Inject as per sliding scale: If 200-280= 2 units 281-320= 4 units 321-400= 6 units 07/29/21   Dahal, Melina Schools, MD  insulin glargine-yfgn (SEMGLEE) 100 UNIT/ML injection Inject 0.33 mLs (33 Units total) into the skin daily. Patient taking differently: Inject 10 Units into the skin every evening. 01/04/22   Rai, Ripudeep K, MD  insulin glargine-yfgn (SEMGLEE, YFGN,) 100 UNIT/ML injection Inject 20 Units into the skin daily.    [provider]  Insulin Pen Needle (BD PEN NEEDLE NANO U/F) 32G X 4 MM MISC USE TO INJECT INSULIN ONCE DAILY 08/31/16   Copland, Gwenlyn Found, MD  Insulin Syringe-Needle U-100 (INSULIN SYRINGE .5CC/31GX5/16") 31G X 5/16" 0.5 ML MISC Use to inject insulin 1 time daily. 09/23/14   Carlus Pavlov, MD  Lancets MISC 1 each by Does not apply route 3 (three) times daily. Dx: E11.65 01/26/21   Copland, Gwenlyn Found, MD  magnesium oxide (MAG-OX) 400 (240 Mg) MG tablet Take 1 tablet (400 mg total) by mouth daily. 01/05/22   Rai, Delene Ruffini, MD  metoprolol tartrate (LOPRESSOR) 25 MG tablet Take 0.5 tablets (12.5 mg total) by mouth 2 (two) times daily. 01/04/22   Rai, Delene Ruffini, MD  Multiple Vitamin (MULTIVITAMIN WITH MINERALS) TABS tablet Take 1 tablet by mouth daily. 01/05/22   Rai, Ripudeep K, MD  polyethylene glycol (MIRALAX / GLYCOLAX) 17 g packet Take 17 g by mouth daily as needed  for moderate constipation. 01/04/22   Rai, Delene Ruffini, MD  rivaroxaban (XARELTO) 20 MG TABS tablet Take 1 tablet (20 mg total) by mouth daily with supper. 01/04/22   Rai, Ripudeep K, MD  senna-docusate (SENOKOT-S) 8.6-50 MG tablet Take 2 tablets by mouth at bedtime. 01/04/22   Rai, Ripudeep K, MD  sitaGLIPtin (JANUVIA) 25 MG tablet Take 25 mg by mouth every morning.    [provider]  venlafaxine (EFFEXOR) 37.5 MG tablet Take 37.5 mg by mouth every morning.    [provider]  venlafaxine XR (EFFEXOR-XR) 75 MG 24 hr capsule Take 1 capsule (75 mg total) by mouth daily with breakfast. Patient not taking: Reported on 05/09/2022 01/05/22   Rai, Delene Ruffini, MD  lisinopril (PRINIVIL,ZESTRIL) 20 MG tablet Take 20 mg by mouth daily.   03/23/11  [provider]    Current Facility-Administered Medications  Medication Dose Route Frequency Provider Last Rate Last Admin   dextrose 50 % solution 12.5 g  12.5 g Intravenous STAT Crosley, Debby,  MD       insulin aspart (novoLOG) injection 0-15 Units  0-15 Units Subcutaneous Q4H Crosley, Debby, MD       milk and molasses enema  1 enema Rectal Once Crosley, Debby, MD       pantoprazole (PROTONIX) injection 40 mg  40 mg Intravenous Q12H Crosley, Debby, MD   40 mg at 02/02/23 2115   piperacillin-tazobactam (ZOSYN) IVPB 3.375 g  3.375 g Intravenous Q8H Alekh, Kshitiz, MD       Current Outpatient Medications  Medication Sig Dispense Refill   acetaminophen (TYLENOL) 325 MG tablet Take 650 mg by mouth in the morning and at bedtime.     Amino Acids-Protein Hydrolys (PRO-STAT PO) Take 30 mLs by mouth daily.     amLODipine (NORVASC) 5 MG tablet TAKE 1 TABLET(5 MG) BY MOUTH DAILY (Patient taking differently: Take 5 mg by mouth daily.) 90 tablet 3   blood glucose meter kit and supplies Dispense based on patient and insurance preference. Pt just needs meter 1 each 0   Blood Glucose Monitoring Suppl (ONE TOUCH ULTRA MINI) w/Device KIT Use to test  blood sugar daily as instructed. Dx: E11.65 1 kit 0   ezetimibe (ZETIA) 10 MG tablet Take 1 tablet (10 mg total) by mouth daily.     glipiZIDE (GLUCOTROL XL) 5 MG 24 hr tablet Take 2 tabs in the morning and 1 tab in the evening. (Patient not taking: Reported on 05/09/2022) 270 tablet 3   glucose blood (ONE TOUCH ULTRA TEST) test strip Test blood sugar 3 times a day. Dx code: 250.00 100 each 12   glucose blood (ONETOUCH ULTRA) test strip Use as instructed 100 each 12   insulin aspart (NOVOLOG) 100 UNIT/ML injection Inject 3 Units into the skin 3 (three) times daily with meals. (Patient taking differently: Inject 2-6 Units into the skin daily. Inject as per sliding scale: If 200-280= 2 units 281-320= 4 units 321-400= 6 units) 10 mL 11   insulin glargine-yfgn (SEMGLEE) 100 UNIT/ML injection Inject 0.33 mLs (33 Units total) into the skin daily. (Patient taking differently: Inject 10 Units into the skin every evening.) 10 mL 11   insulin glargine-yfgn (SEMGLEE, YFGN,) 100 UNIT/ML injection Inject 20 Units into the skin daily.     Insulin Pen Needle (BD PEN NEEDLE NANO U/F) 32G X 4 MM MISC USE TO INJECT INSULIN ONCE DAILY 100 each 6   Insulin Syringe-Needle U-100 (INSULIN SYRINGE .5CC/31GX5/16") 31G X 5/16" 0.5 ML MISC Use to inject insulin 1 time daily. 90 each 3   Lancets MISC 1 each by Does not apply route 3 (three) times daily. Dx: E11.65 100 each 0   magnesium oxide (MAG-OX) 400 (240 Mg) MG tablet Take 1 tablet (400 mg total) by mouth daily.     metoprolol tartrate (LOPRESSOR) 25 MG tablet Take 0.5 tablets (12.5 mg total) by mouth 2 (two) times daily.     Multiple Vitamin (MULTIVITAMIN WITH MINERALS) TABS tablet Take 1 tablet by mouth daily.     polyethylene glycol (MIRALAX / GLYCOLAX) 17 g packet Take 17 g by mouth daily as needed for moderate constipation. 14 each 0   rivaroxaban (XARELTO) 20 MG TABS tablet Take 1 tablet (20 mg total) by mouth daily with supper. 30 tablet    senna-docusate  (SENOKOT-S) 8.6-50 MG tablet Take 2 tablets by mouth at bedtime.     sitaGLIPtin (JANUVIA) 25 MG tablet Take 25 mg by mouth every morning.     venlafaxine (EFFEXOR) 37.5 MG  tablet Take 37.5 mg by mouth every morning.     venlafaxine XR (EFFEXOR-XR) 75 MG 24 hr capsule Take 1 capsule (75 mg total) by mouth daily with breakfast. (Patient not taking: Reported on 05/09/2022) 10 capsule 0    Allergies as of 02/02/2023 - Review Complete 02/02/2023  Allergen Reaction Noted   Ace inhibitors Diarrhea, Swelling, Other (See Comments), and Cough 03/23/2011   Alendronate sodium Other (See Comments) 12/12/2010   Aspirin Hives 12/12/2010   Crestor [rosuvastatin] Other (See Comments) 11/13/2018   Lipitor [atorvastatin calcium] Other (See Comments) 12/12/2010   Metformin and related  03/09/2011   Shellfish allergy Nausea Only 10/04/2015   Statins Other (See Comments) 12/01/2021    Family History  Problem Relation Age of Onset   Cancer Mother        liver   Breast cancer Mother 16   Pancreatitis Father        deceased 49   Cancer Brother        GI   Coronary artery disease Paternal Grandmother    Cancer Son        terminal kidney   Breast cancer Maternal Aunt    Cancer Other    Coronary artery disease Paternal Uncle     Social History   Socioeconomic History   Marital status: Widowed    Spouse name: Not on file   Number of children: Not on file   Years of education: Not on file   Highest education level: Not on file  Occupational History   Not on file  Tobacco Use   Smoking status: Never   Smokeless tobacco: Never  Vaping Use   Vaping status: Never Used  Substance and Sexual Activity   Alcohol use: Yes    Alcohol/week: 1.0 standard drink of alcohol    Types: 1 Standard drinks or equivalent per week    Comment: maybe once per month - 3 drinks   Drug use: No   Sexual activity: Not Currently    Birth control/protection: Post-menopausal    Comment: Hysterectomy  Other Topics  Concern   Not on file  Social History Narrative   Widowed. Education: [employer]. Exercise: 2 times a week for 30 minutes.   Social Drivers of Corporate investment banker Strain: Low Risk  (03/18/2020)   Overall Financial Resource Strain (CARDIA)    Difficulty of Paying Living Expenses: Not hard at all  Food Insecurity: No Food Insecurity (03/18/2020)   Hunger Vital Sign    Worried About Running Out of Food in the Last Year: Never true    Ran Out of Food in the Last Year: Never true  Transportation Needs: No Transportation Needs (03/18/2020)   PRAPARE - Administrator, Civil Service (Medical): No    Lack of Transportation (Non-Medical): No  Physical Activity: Inactive (03/18/2020)   Exercise Vital Sign    Days of Exercise per Week: 0 days    Minutes of Exercise per Session: 0 min  Stress: No Stress Concern Present (03/18/2020)   [employer] of Occupational Health - Occupational Stress Questionnaire    Feeling of Stress : Not at all  Social Connections: Socially Isolated (03/18/2020)   Social Connection and Isolation Panel [NHANES]    Frequency of Communication with Friends and Family: More than three times a week    Frequency of Social Gatherings with Friends and Family: More than three times a week    Attends Religious Services: Never    Production manager of [employer]  or Organizations: No    Attends Banker Meetings: Never    Marital Status: Widowed  Intimate Partner Violence: Not At Risk (03/18/2020)   Humiliation, Afraid, Rape, and Kick questionnaire    Fear of Current or Ex-Partner: No    Emotionally Abused: No    Physically Abused: No    Sexually Abused: No    Review of Systems: Pertinent positive and negative review of systems were noted in the above HPI section.  All other review of systems was otherwise negative.   Physical Exam: Vital signs in last 24 hours: Temp:  [97.4 F (36.3 C)-98.3 F (36.8 C)] 97.4 F (36.3 C) (01/25 0900) Pulse Rate:   [66-82] 72 (01/25 0945) Resp:  [11-23] 19 (01/25 0945) BP: (110-159)/(48-78) 117/51 (01/25 0945) SpO2:  [98 %-100 %] 100 % (01/25 0945) Weight:  [65.8 kg] 65.8 kg (01/25 0200)   General:     Well-developed, thin elderly white female , sleeping, did not arouse to exam or voice notes nonverbal Head:  Normocephalic and atraumatic. Eyes:  Sclera clear, no icterus.   Conjunctiva pink. Ears:  Normal auditory acuity. Nose:  No deformity, discharge,  or lesions. Mouth:  No deformity or lesions.   Neck:  Supple; no masses or thyromegaly. Lungs:  Clear throughout to auscultation.   No wheezes, crackles, or rhonchi . Heart:  Regular rate and rhythm; no murmurs, clicks, rubs,  or gallops. Abdomen:  Soft,nontender, BS active,nonpalp mass or hsm.,  Midline incisional scar Rectal: Tight anal sphincter, soft stool impaction in the rectum, brown stool no gross blood Msk:  Symmetrical without gross deformities. . Pulses:  Normal pulses noted. Extremities:  Without clubbing or edema. Neurologic: Nonverbal, hemiplegia post CVA Skin:  Intact without significant lesions or rashes.. Psych:  non Verbal, no response to voice  Intake/Output from previous day: 01/24 0701 - 01/25 0700 In: 1046.1 [I.V.:498.1; Blood:548] Out: -  Intake/Output this shift: Total I/O In: 234 [Blood:234] Out: -   Lab Results: Recent Labs    02/02/23 1940 02/03/23 0109 02/03/23 0939  WBC 9.6  --   --   HGB 6.2* 8.2* 7.6*  HCT 21.2* 26.1* 23.8*  PLT 401*  --   --    BMET Recent Labs    02/02/23 1940  NA 142  K 4.1  CL 109  CO2 24  GLUCOSE 115*  BUN 25*  CREATININE 0.97  CALCIUM 9.7   LFT Recent Labs    02/02/23 1940  PROT 6.6  ALBUMIN 3.0*  AST 18  ALT 15  ALKPHOS 57  BILITOT 0.4   PT/INR Recent Labs    02/02/23 1940  LABPROT 26.2*  INR 2.4*   Hepatitis Panel No results for input(s): "HEPBSAG", "HCVAB", "HEPAIGM", "HEPBIGM" in the last 72 hours.   IMPRESSION:  #62 83 year old white female,  nursing home resident with history of prior CVA with hemiaplasia,, dysarthria with nonverbal state To ER from facility with outpatient labs showing hemoglobin of 6.2 Have any recent labs for comparison but April 2024 hemoglobin was 12  Heme positive-and iron deficient  CT imaging shows a fecal impaction and concern for rectal malignancy versus a stercoral colitis.  No report of any obvious bleeding recently from the nursing facility.  At this time it is not certain whether she has developed anemia all from subacute GI blood loss, versus underlying hematologic disorder/myeloma  #2 chronic anticoagulation-on Xarelto #3 prior history of DVT #4.  Diabetes mellitus #5.  Hypertension #6.  Coagulopathy possibly secondary to  Xarelto #7 concern for myeloma by imaging of the bilateral humeri #8 probable pneumonia  PLAN: Patient needs to be disimpacted, will order smog enemas with interval disimpaction per nursing Start twice daily MiraLAX And films in a.m. to reassess impaction Okay for clear to full liquid diet from GI perspective after she has been cleared by speech path for p.o.'s  Hold Xarelto  Await multiple pending labs We will need to have further discussion with her family regarding their desire for aggressive management versus conservative management, prior to making any decisions about procedures/sigmoidoscopy   Amy EsterwoodPA-C  02/03/2023, 10:36 AM     Attending physician's note   I have reviewed the chart and examined the patient.  History not obtainable.  I performed a substantive portion of this encounter, including complete performance of at least one of the key components, in conjunction with the APP. I agree with the Advanced Practitioner's note, impression and recommendations.   83yr old nonverbal pt with prior CVA, dementia, DM2, HTN, prior DVT on Xeralto adm with Hb 6.2 (labs done at [facility], down from 12 in April 2024) with H+ stools. Being evaluated for multiple myeloma  vs metastatic disease (on CxR)  CT AP done today shows large fecal impaction (rectum 7 cm), possible stercoral colitis vs rectal mass.  Rectal exam (by Amy) shows impacted stool.  Difficult to assess for masses. Brown without gross blood.  Plan: -SMOG enemas + manual disimpaction -Start MiraLAX 17 g p.o. twice daily thereafter -Since no obstruction, OK with clear liquid diet from our standpoint. -Ideally would recommend FS.  However, patient is nonverbal-may not change any management.  Family to make decision.  I would recommend palliative care/hospice care. -Hold Xarelto. ?need for continuation. -Agree with blood transfusion for now until family decision.   Edman Circle, MD [facility] [phone]

## 2023-02-03 NOTE — ED Notes (Signed)
ED TO INPATIENT HANDOFF REPORT  ED Nurse Name and Phone #: Cheral Bay Name/Age/Gender Smith Mince 83 y.o. female Room/Bed: 038C/038C  Code Status   Code Status: Limited: Do not attempt resuscitation (DNR) -DNR-LIMITED -Do Not Intubate/DNI   Home/SNF/Other Nursing Home Patient oriented to: self Is this baseline? Yes   Triage Complete: Triage complete  Chief Complaint GI bleed [K92.2]  Triage Note BIBA per EMS pt coming from [facility] w/ c/o low hgb of 5.7 per facility.. VSS. Hx dementia.    Allergies Allergies  Allergen Reactions   Ace Inhibitors Diarrhea, Swelling, Other (See Comments) and Cough    Pt had cough and diarrhea with first few doses of medication; also had swelling of right eyelid. Stopped medication on 03/17/11.   Alendronate Sodium Other (See Comments)    "Made my whole body hurt"   Aspirin Hives   Crestor [Rosuvastatin] Other (See Comments)    "Made my whole body hurt"   Lipitor [Atorvastatin Calcium] Other (See Comments)    "Made my whole body hurt"   Metformin And Related     Severe abdominal pain   Shellfish Allergy Nausea Only    Intolerant of fresh shellfish, reports the reaction is GI upset, denies hives, denies any swelling  Reports that she can tolerate canned seafood.    Statins Other (See Comments)    myalgia    Level of Care/Admitting Diagnosis ED Disposition     ED Disposition  Admit   Condition  --   Comment  Hospital Area: [facility] [[id number]]  Level of Care: Telemetry Medical [104]  May admit patient to [facility] or [facility] if equivalent level of care is available:: Yes  Covid Evaluation: Asymptomatic - no recent exposure (last 10 days) testing not required  Diagnosis: GI bleed [409811]  Admitting Physician: Gery Pray [[id number]]  Attending Physician: Gery Pray [[id number]]  Certification:: I certify this patient will need inpatient services for at least 2 midnights  Expected Medical  Readiness: 02/04/2023          B Medical/Surgery History Past Medical History:  Diagnosis Date   Arthritis    Carotid stenosis 11/2018   Cataract    surgery to remove   Diabetes mellitus    type 2   Hyperlipidemia    Hypertension    Skin cancer    Removed from face   Stroke (HCC) 09/19/2018   Urgency of urination    Urinary leakage    Past Surgical History:  Procedure Laterality Date   ABDOMINAL HYSTERECTOMY  early 80's   total   CAROTID ENDARTERECTOMY Left 11/13/2018   CARPAL TUNNEL RELEASE Bilateral 1980 and 1981   both hands   ENDARTERECTOMY Left 11/13/2018   Procedure: Left Carotid Artery Endarterectomy with Patch Angioplasty;  Surgeon: Larina Earthly, MD;  Location: [facility] OR;  Service: Vascular;  Laterality: Left;   EYE SURGERY     bilateral cataracts   Fatty Tumor Excision     JOINT REPLACEMENT     TONSILLECTOMY     TONSILLECTOMY AND ADENOIDECTOMY  age 74   TOTAL KNEE ARTHROPLASTY Right 10/04/2015   TOTAL KNEE ARTHROPLASTY Right 10/04/2015   Procedure: TOTAL KNEE ARTHROPLASTY;  Surgeon: Jodi Geralds, MD;  Location: [facility] OR;  Service: Orthopedics;  Laterality: Right;   TOTAL KNEE ARTHROPLASTY Left 01/28/2016   Procedure: TOTAL KNEE ARTHROPLASTY;  Surgeon: Jodi Geralds, MD;  Location: [facility] OR;  Service: Orthopedics;  Laterality: Left;   TUBAL LIGATION  A IV Location/Drains/Wounds Patient Lines/Drains/Airways Status     Active Line/Drains/Airways     Name Placement date Placement time Site Days   Peripheral IV 02/02/23 20 G Right Antecubital 02/02/23  1950  Antecubital  1   Peripheral IV 02/02/23 22 G Anterior;Right Wrist 02/02/23  2224  Wrist  1   External Urinary Catheter 12/30/21  0800  --  400   Pressure Injury 12/27/21 Buttocks Left Stage 2 -  Partial thickness loss of dermis presenting as a shallow open injury with a red, pink wound bed without slough. 2"x 0.7" 12/27/21  1457  -- 403            Intake/Output Last 24 hours  Intake/Output Summary (Last  24 hours) at 02/03/2023 1256 Last data filed at 02/03/2023 0817 Gross per 24 hour  Intake 1280.14 ml  Output --  Net 1280.14 ml    Labs/Imaging Results for orders placed or performed during the hospital encounter of 02/02/23 (from the past 48 hours)  Comprehensive metabolic panel     Status: Abnormal   Collection Time: 02/02/23  7:40 PM  Result Value Ref Range   Sodium 142 135 - 145 mmol/L   Potassium 4.1 3.5 - 5.1 mmol/L   Chloride 109 98 - 111 mmol/L   CO2 24 22 - 32 mmol/L   Glucose, Bld 115 (H) 70 - 99 mg/dL    Comment: Glucose reference range applies only to samples taken after fasting for at least 8 hours.   BUN 25 (H) 8 - 23 mg/dL   Creatinine, Ser 2.95 0.44 - 1.00 mg/dL   Calcium 9.7 8.9 - 62.1 mg/dL   Total Protein 6.6 6.5 - 8.1 g/dL   Albumin 3.0 (L) 3.5 - 5.0 g/dL   AST 18 15 - 41 U/L   ALT 15 0 - 44 U/L   Alkaline Phosphatase 57 38 - 126 U/L   Total Bilirubin 0.4 0.0 - 1.2 mg/dL   GFR, Estimated 58 (L) >60 mL/min    Comment: (NOTE) Calculated using the CKD-EPI Creatinine Equation (2021)    Anion gap 9 5 - 15    Comment: Performed at [facility], [address], [place], [place] [address]  CBC with Differential     Status: Abnormal   Collection Time: 02/02/23  7:40 PM  Result Value Ref Range   WBC 9.6 4.0 - 10.5 K/uL   RBC 2.66 (L) 3.87 - 5.11 MIL/uL   Hemoglobin 6.2 (LL) 12.0 - 15.0 g/dL    Comment: This critical result has verified and been called to [facility] RN by Brayton Layman on 01 24 2025 at 2002, and has been read back.  REPEATED TO VERIFY CORRECTED ON 01/24 AT 2026: PREVIOUSLY REPORTED AS 6.2 This critical result has verified and been called to [facility] RN by Brayton Layman on 01 24 2025 at 2002, and has been read back.     HCT 21.2 (L) 36.0 - 46.0 %   MCV 79.7 (L) 80.0 - 100.0 fL   MCH 23.3 (L) 26.0 - 34.0 pg   MCHC 29.2 (L) 30.0 - 36.0 g/dL   RDW 78.4 69.6 - 29.5 %   Platelets 401 (H) 150 - 400 K/uL   nRBC 0.0 0.0 - 0.2 %   Neutrophils Relative % 69  %   Neutro Abs 6.6 1.7 - 7.7 K/uL   Lymphocytes Relative 21 %   Lymphs Abs 2.0 0.7 - 4.0 K/uL   Monocytes Relative 9 %  Monocytes Absolute 0.9 0.1 - 1.0 K/uL   Eosinophils Relative 0 %   Eosinophils Absolute 0.0 0.0 - 0.5 K/uL   Basophils Relative 1 %   Basophils Absolute 0.1 0.0 - 0.1 K/uL   Immature Granulocytes 0 %   Abs Immature Granulocytes 0.02 0.00 - 0.07 K/uL    Comment: Performed at [facility], [address], [place], [place] [address]  Protime-INR     Status: Abnormal   Collection Time: 02/02/23  7:40 PM  Result Value Ref Range   Prothrombin Time 26.2 (H) 11.4 - 15.2 seconds   INR 2.4 (H) 0.8 - 1.2    Comment: (NOTE) INR goal varies based on device and disease states. Performed at [facility], [address] [place], [place] [address]   Type and screen [facility]     Status: None   Collection Time: 02/02/23  7:43 PM  Result Value Ref Range   ABO/RH(D) B POS    Antibody Screen NEG    Sample Expiration 02/05/2023,2359    Unit Number [id number]    Blood Component Type RED CELLS,LR    Unit division 00    Status of Unit ISSUED,FINAL    Transfusion Status OK TO TRANSFUSE    Crossmatch Result      Compatible Performed at [facility], [address], [place], [place] [address]   POC occult blood, ED Provider will collect     Status: Abnormal   Collection Time: 02/02/23  8:04 PM  Result Value Ref Range   Fecal Occult Bld POSITIVE (A) NEGATIVE  Prepare RBC (crossmatch)     Status: None   Collection Time: 02/02/23  8:18 PM  Result Value Ref Range   Order Confirmation      ORDER PROCESSED BY BLOOD BANK Performed at [facility], [address], [place], [place] [address]   Vitamin B12     Status: None   Collection Time: 02/02/23  9:40 PM  Result Value Ref Range   Vitamin B-12 850 180 - 914 pg/mL    Comment: (NOTE) This assay is not validated for testing neonatal or myeloproliferative syndrome specimens for  Vitamin B12 levels. Performed at [facility], [address] [place], [place] [address]   Folate     Status: None   Collection Time: 02/02/23  9:40 PM  Result Value Ref Range   Folate 28.2 >5.9 ng/mL    Comment: Performed at [facility], [address] [place], [place] [address]  Iron and TIBC     Status: Abnormal   Collection Time: 02/02/23  9:40 PM  Result Value Ref Range   Iron 15 (L) 28 - 170 ug/dL   TIBC 132 440 - 102 ug/dL   Saturation Ratios 4 (L) 10.4 - 31.8 %   UIBC 352 ug/dL    Comment: Performed at [facility], [address], [place], [place] [address]  Ferritin     Status: None   Collection Time: 02/02/23  9:40 PM  Result Value Ref Range   Ferritin 16 11 - 307 ng/mL    Comment: Performed at [facility], [address] [place], [place] [address]  Reticulocytes     Status: Abnormal   Collection Time: 02/02/23  9:40 PM  Result Value Ref Range   Retic Ct Pct 1.4 0.4 - 3.1 %   RBC. 2.47 (L) 3.87 - 5.11 MIL/uL   Retic Count,  Absolute 34.1 19.0 - 186.0 K/uL   Immature Retic Fract 15.9 2.3 - 15.9 %    Comment: Performed at [facility], [address], [place], [place] [address]  Hemoglobin A1c     Status: Abnormal   Collection Time: 02/02/23  9:40 PM  Result Value Ref Range   Hgb A1c MFr Bld 7.4 (H) 4.8 - 5.6 %    Comment: (NOTE) Pre diabetes:          5.7%-6.4%  Diabetes:              >6.4%  Glycemic control for   <7.0% adults with diabetes    Mean Plasma Glucose 165.68 mg/dL    Comment: Performed at [facility], [address] [place], [place] [address]  PSA     Status: None   Collection Time: 02/02/23  9:40 PM  Result Value Ref Range   Prostatic Specific Antigen <0.01 0.00 - 4.00 ng/mL    Comment: (NOTE) While PSA levels of <=4.00 ng/ml are reported as reference range, some men with levels below 4.00 ng/ml can have prostate cancer and many men with PSA above 4.00 ng/ml do not have prostate cancer.  Other  tests such as free PSA, age specific reference ranges, PSA velocity and PSA doubling time may be helpful especially in men less than 42 years old. Performed at [facility], [address], [place], [place] [address]   CBG monitoring, ED     Status: Abnormal   Collection Time: 02/02/23 10:24 PM  Result Value Ref Range   Glucose-Capillary 69 (L) 70 - 99 mg/dL    Comment: Glucose reference range applies only to samples taken after fasting for at least 8 hours.  POC occult blood, ED     Status: Abnormal   Collection Time: 02/02/23 10:42 PM  Result Value Ref Range   Fecal Occult Bld POSITIVE (A) NEGATIVE  CBG monitoring, ED     Status: Abnormal   Collection Time: 02/02/23 10:51 PM  Result Value Ref Range   Glucose-Capillary 112 (H) 70 - 99 mg/dL    Comment: Glucose reference range applies only to samples taken after fasting for at least 8 hours.  Ammonia     Status: None   Collection Time: 02/03/23 12:30 AM  Result Value Ref Range   Ammonia 27 9 - 35 umol/L    Comment: Performed at [facility], [address] [place], [place] [address]  Blood gas, venous     Status: Abnormal   Collection Time: 02/03/23 12:30 AM  Result Value Ref Range   pH, Ven 7.39 7.25 - 7.43   pCO2, Ven 44 44 - 60 mmHg   pO2, Ven <31 (LL) 32 - 45 mmHg    Comment: CRITICAL RESULT CALLED TO, READ BACK BY AND VERIFIED WITH:  J.[facility] RN [phone] 02/03/2023 BY G.GANADEN    Bicarbonate 27.2 20.0 - 28.0 mmol/L   Acid-Base Excess 1.7 0.0 - 2.0 mmol/L   O2 Saturation 50.7 %   Patient temperature 36.7    Collection site RIGHT ANTECUBITAL    Drawn by DRAWN BY RN     Comment: Performed at [facility], [address] [place], [place] [address]  Hemoglobin and hematocrit, blood     Status: Abnormal   Collection Time: 02/03/23  1:09 AM  Result Value Ref Range   Hemoglobin 8.2 (L) 12.0 - 15.0 g/dL    Comment: REPEATED TO VERIFY POST TRANSFUSION SPECIMEN  HCT 26.1 (L) 36.0 - 46.0 %     Comment: Performed at [facility], [address] [place], [place] [address]  CBG monitoring, ED     Status: Abnormal   Collection Time: 02/03/23  2:26 AM  Result Value Ref Range   Glucose-Capillary 318 (H) 70 - 99 mg/dL    Comment: Glucose reference range applies only to samples taken after fasting for at least 8 hours.  Prepare fresh frozen plasma     Status: None (Preliminary result)   Collection Time: 02/03/23  2:54 AM  Result Value Ref Range   Unit Number [id number]    Blood Component Type THW PLS APHR    Unit division A0    Status of Unit ISSUED    Transfusion Status OK TO TRANSFUSE    Unit Number [id number]    Blood Component Type THAWED PLASMA    Unit division 00    Status of Unit ISSUED    Transfusion Status      OK TO TRANSFUSE Performed at [facility], [address], [place], [place] [address]   Culture, blood (Routine X 2) w Reflex to ID Panel     Status: None (Preliminary result)   Collection Time: 02/03/23  3:21 AM   Specimen: BLOOD  Result Value Ref Range   Specimen Description BLOOD BLOOD LEFT HAND    Special Requests      BOTTLES DRAWN AEROBIC AND ANAEROBIC Blood Culture adequate volume   Culture      NO GROWTH < 12 HOURS Performed at [facility], [address], [place], [place] [address]    Report Status PENDING   Culture, blood (Routine X 2) w Reflex to ID Panel     Status: None (Preliminary result)   Collection Time: 02/03/23  3:21 AM   Specimen: BLOOD  Result Value Ref Range   Specimen Description BLOOD BLOOD LEFT ARM    Special Requests      BOTTLES DRAWN AEROBIC AND ANAEROBIC Blood Culture adequate volume   Culture      NO GROWTH < 12 HOURS Performed at [facility], [address], [place], [place] [address]    Report Status PENDING   CBG monitoring, ED     Status: None   Collection Time: 02/03/23  4:19 AM  Result Value Ref Range   Glucose-Capillary 91 70 - 99 mg/dL    Comment: Glucose reference range  applies only to samples taken after fasting for at least 8 hours.  CBG monitoring, ED     Status: None   Collection Time: 02/03/23  7:48 AM  Result Value Ref Range   Glucose-Capillary 82 70 - 99 mg/dL    Comment: Glucose reference range applies only to samples taken after fasting for at least 8 hours.   Comment 1 Notify RN    Comment 2 Document in Chart   Hemoglobin and hematocrit, blood     Status: Abnormal   Collection Time: 02/03/23  9:39 AM  Result Value Ref Range   Hemoglobin 7.6 (L) 12.0 - 15.0 g/dL   HCT 44.0 (L) 10.2 - 72.5 %    Comment: Performed at [facility], [address] [place], [place] [address]  CBG monitoring, ED     Status: Abnormal   Collection Time: 02/03/23 12:14 PM  Result Value Ref Range   Glucose-Capillary 67 (L) 70 - 99 mg/dL    Comment: Glucose reference range applies only to samples  taken after fasting for at least 8 hours.   CT CHEST ABDOMEN PELVIS W CONTRAST Result Date: 02/03/2023 CLINICAL DATA:  Metastatic colon cancer staging. EXAM: CT CHEST, ABDOMEN, AND PELVIS WITH CONTRAST TECHNIQUE: Multidetector CT imaging of the chest, abdomen and pelvis was performed following the standard protocol during bolus administration of intravenous contrast. RADIATION DOSE REDUCTION: This exam was performed according to the departmental dose-optimization program which includes automated exposure control, adjustment of the mA and/or kV according to patient size and/or use of iterative reconstruction technique. CONTRAST:  75mL OMNIPAQUE IOHEXOL 350 MG/ML SOLN COMPARISON:  CT abdomen and pelvis 11/30/2021. FINDINGS: CT CHEST FINDINGS Cardiovascular: No significant vascular findings. Normal heart size. No pericardial effusion. There are atherosclerotic calcifications of the aorta. Mediastinum/Nodes: The thyroid gland is heterogeneous and enlarged containing numerous nodules. The largest nodular densities in the isthmus measuring up to 2.8 cm. There are no enlarged  mediastinal, hilar or axillary lymph nodes. Visualized esophagus is within normal limits. Lungs/Pleura: There is atelectasis in the bilateral lower lobes, right greater than left. Patchy ground-glass opacities in the left upper lobe are nonspecific. There is no pleural effusion or pneumothorax. There some secretions in the trachea. Musculoskeletal: No acute osseous findings. No suspicious osseous lesion identified. T11 vertebral body hemangioma is present. CT ABDOMEN PELVIS FINDINGS Hepatobiliary: No focal liver abnormality is seen. No gallstones, gallbladder wall thickening, or biliary dilatation. Pancreas: Low-attenuation lesions are seen in the body and tail of the pancreas measuring up to 1 cm. These have mildly increased in size in the tail of the pancreas. There is no pancreatic ductal dilatation or surrounding inflammation. Spleen: Normal in size without focal abnormality. Adrenals/Urinary Tract: Adrenal glands are unremarkable. There is no hydronephrosis. There is a left renal cyst measuring 15 mm. Bladder is unremarkable. Stomach/Bowel: There circumferential distal rectal wall thickening. The rectum is dilated and stool-filled with mild surrounding inflammation. No dilated bowel loops are seen. The appendix is within normal limits. Small bowel and stomach are within normal limits. Vascular/Lymphatic: Aortic atherosclerosis. No enlarged abdominal or pelvic lymph nodes. Reproductive: Status post hysterectomy. No adnexal masses. Other: There is presacral edema. There is no ascites or focal abdominal wall hernia. There is mild body wall edema. Musculoskeletal: No acute fracture or focal osseous lesion identified. The bones are diffusely osteopenic. IMPRESSION: 1. Circumferential distal rectal wall thickening with surrounding inflammation and presacral edema. Findings are suspicious for rectal malignancy. 2. There is rectal fecal impaction and findings suspicious for stercoral colitis. 3. No evidence for  metastatic disease in the chest, abdomen or pelvis. 4. Patchy ground-glass opacities in the left upper lobe are nonspecific and may be infectious/inflammatory. 5. Low-attenuation lesions in the body and tail of the pancreas measuring up to 1 cm. These have mildly increased in size in the tail of the pancreas. Findings may represent side branch IPMNs. Recommend follow-up MRI in 1 year. 6. Multinodular goiter.  Recommend nonemergent thyroid ultrasound. 7. Mild body wall edema. 8. Left Bosniak I benign renal cyst measuring 1.5 cm. No follow-up imaging is recommended. JACR 2018 Feb; 264-273, Management of the Incidental Renal Mass on CT, [employer] 2021; [id number], Bosniak Classification of Cystic Renal Masses, Version 2019. Aortic Atherosclerosis (ICD10-I70.0). Electronically Signed   By: Darliss Cheney M.D.   On: 02/03/2023 02:17   DG Chest 1 View Result Date: 02/02/2023 CLINICAL DATA:  Sepsis EXAM: CHEST  1 VIEW COMPARISON:  12/26/2021 FINDINGS: Low lung volumes. No consolidation or effusion. Stable cardiomediastinal silhouette with aortic atherosclerosis. No pneumothorax. Interval permeative  appearance of the shafts of the left greater than right humerus. IMPRESSION: 1. Low lung volumes. No active disease. 2. Interval permeative appearance of the shafts of the left greater than right humerus, appearance is suspicious for metastatic disease or marrow disease such as myeloma. Electronically Signed   By: Jasmine Pang M.D.   On: 02/02/2023 20:03    Pending Labs Unresulted Labs (From admission, onward)     Start     Ordered   02/04/23 0500  CBC with Differential/Platelet  Tomorrow morning,   R        02/03/23 1136   02/04/23 0500  Comprehensive metabolic panel  Tomorrow morning,   R        02/03/23 1136   02/04/23 0500  Magnesium  Tomorrow morning,   R        02/03/23 1136   02/03/23 0245  Legionella Pneumophila Serogp 1 Ur Ag  Once,   R        02/03/23 0244   02/03/23 0245  Strep pneumoniae urinary  antigen  Once,   R        02/03/23 0244   02/02/23 2057  AFP tumor marker  Once,   R        02/02/23 2056   02/02/23 2056  CEA  Once,   R        02/02/23 2056   02/02/23 2056  Cancer antigen 19-9  Once,   R        02/02/23 2056   02/02/23 2022  Multiple Myeloma Panel (SPEP&IFE w/QIG)  Once,   URGENT        02/02/23 2021            Vitals/Pain Today's Vitals   02/03/23 0830 02/03/23 0900 02/03/23 0945 02/03/23 1200  BP: (!) 132/59 (!) 159/61 (!) 117/51 (!) 125/50  Pulse: 72 82 72 66  Resp: 17 18 19 16   Temp:  (!) 97.4 F (36.3 C)    TempSrc:  Axillary    SpO2: 100% 100% 100% 100%  Weight:      Height:      PainSc:        Isolation Precautions No active isolations  Medications Medications  insulin aspart (novoLOG) injection 0-15 Units ( Subcutaneous Not Given 02/03/23 1216)  pantoprazole (PROTONIX) injection 40 mg (40 mg Intravenous Given 02/03/23 1052)  dextrose 50 % solution 12.5 g (0 g Intravenous Hold 02/02/23 2233)  milk and molasses enema (has no administration in time range)  piperacillin-tazobactam (ZOSYN) IVPB 3.375 g (has no administration in time range)  polyethylene glycol (MIRALAX / GLYCOLAX) packet 17 g (17 g Oral Not Given 02/03/23 1206)  sorbitol, magnesium hydroxide, mineral oil, glycerin (SMOG) enema (has no administration in time range)  0.9 %  sodium chloride infusion ( Intravenous New Bag/Given 02/03/23 1254)  dextrose 50 % solution 12.5 g (12.5 g Intravenous Given 02/03/23 1237)  0.9 %  sodium chloride infusion (Manually program via Guardrails IV Fluids) (0 mLs Intravenous Stopped 02/03/23 0641)  dextrose 50 % solution 12.5 g (12.5 g Intravenous Given 02/02/23 2232)  iohexol (OMNIPAQUE) 350 MG/ML injection 75 mL (75 mLs Intravenous Contrast Given 02/03/23 0204)  piperacillin-tazobactam (ZOSYN) IVPB 3.375 g (0 g Intravenous Stopped 02/03/23 0842)  0.9 %  sodium chloride infusion (Manually program via Guardrails IV Fluids) ( Intravenous Canceled Entry 02/03/23  0814)    Mobility non-ambulatory     Focused Assessments Low Hgb, then Sepsis   R Recommendations: See Admitting Provider Note  Report given to:   Additional Notes:   Per family, pt will be lethargic/asleep for 3 days and then be alert for 3 days, but has been lethargic since she has been here... I haven't gave anything by mouth, too lethargic, small bowel movement this morning,

## 2023-02-03 NOTE — Progress Notes (Signed)
Pharmacy Antibiotic Note  Wanda [name] is a 82 y.o. female admitted on 02/02/2023 with pneumonia.  Pharmacy has been consulted for zosyn dosing.  Plan: Zosyn 3.375g over 30 minutes x 1 followed by zosyn 3.375g EI q8h  F/u renal function, infectious work up and length of therapy   Height: 5\' 3"  (160 cm) Weight: 65.8 kg (145 lb) IBW/kg (Calculated) : 52.4  Temp (24hrs), Avg:98.1 F (36.7 C), Min:97.9 F (36.6 C), Max:98.3 F (36.8 C)  Recent Labs  Lab 02/02/23 1940  WBC 9.6  CREATININE 0.97    Estimated Creatinine Clearance: 40.1 mL/min (by C-G formula based on SCr of 0.97 mg/dL).    Allergies  Allergen Reactions   Ace Inhibitors Diarrhea, Swelling, Other (See Comments) and Cough    Pt had cough and diarrhea with first few doses of medication; also had swelling of right eyelid. Stopped medication on 03/17/11.   Alendronate Sodium Other (See Comments)    "Made my whole body hurt"   Aspirin Hives   Crestor [Rosuvastatin] Other (See Comments)    "Made my whole body hurt"   Lipitor [Atorvastatin Calcium] Other (See Comments)    "Made my whole body hurt"   Metformin And Related     Severe abdominal pain   Shellfish Allergy Nausea Only    Intolerant of fresh shellfish, reports the reaction is GI upset, denies hives, denies any swelling  Reports that she can tolerate canned seafood.    Statins Other (See Comments)    myalgia    Antimicrobials this admission: Zosyn 1/25 >  Thank u for allowing pharmacy to be a part of this patient's care.  Marja Kays 02/03/2023 2:42 AM

## 2023-02-04 ENCOUNTER — Inpatient Hospital Stay (HOSPITAL_COMMUNITY): Payer: Medicare Other

## 2023-02-04 DIAGNOSIS — Z66 Do not resuscitate: Secondary | ICD-10-CM | POA: Diagnosis not present

## 2023-02-04 DIAGNOSIS — K922 Gastrointestinal hemorrhage, unspecified: Secondary | ICD-10-CM | POA: Diagnosis not present

## 2023-02-04 DIAGNOSIS — K5641 Fecal impaction: Secondary | ICD-10-CM

## 2023-02-04 DIAGNOSIS — D649 Anemia, unspecified: Secondary | ICD-10-CM

## 2023-02-04 DIAGNOSIS — E43 Unspecified severe protein-calorie malnutrition: Secondary | ICD-10-CM | POA: Diagnosis not present

## 2023-02-04 DIAGNOSIS — Z515 Encounter for palliative care: Secondary | ICD-10-CM | POA: Diagnosis not present

## 2023-02-04 LAB — BLOOD CULTURE ID PANEL (REFLEXED) - BCID2

## 2023-02-04 LAB — COMPREHENSIVE METABOLIC PANEL
ALT: 16 U/L (ref 0–44)
AST: 26 U/L (ref 15–41)
Albumin: 2.8 g/dL — ABNORMAL LOW (ref 3.5–5.0)
Alkaline Phosphatase: 55 U/L (ref 38–126)
Anion gap: 9 (ref 5–15)
BUN: 19 mg/dL (ref 8–23)
CO2: 22 mmol/L (ref 22–32)
Calcium: 9.4 mg/dL (ref 8.9–10.3)
Chloride: 112 mmol/L — ABNORMAL HIGH (ref 98–111)
Creatinine, Ser: 1.05 mg/dL — ABNORMAL HIGH (ref 0.44–1.00)
GFR, Estimated: 53 mL/min — ABNORMAL LOW (ref >60)
Glucose, Bld: 128 mg/dL — ABNORMAL HIGH (ref 70–99)
Potassium: 3.8 mmol/L (ref 3.5–5.1)
Sodium: 143 mmol/L (ref 135–145)
Total Bilirubin: 0.7 mg/dL (ref 0.0–1.2)
Total Protein: 6.2 g/dL — ABNORMAL LOW (ref 6.5–8.1)

## 2023-02-04 LAB — PREPARE FRESH FROZEN PLASMA: Unit division: 0

## 2023-02-04 LAB — CBC WITH DIFFERENTIAL/PLATELET
Abs Immature Granulocytes: 0.05 10*3/uL (ref 0.00–0.07)
Basophils Absolute: 0 10*3/uL (ref 0.0–0.1)
Basophils Relative: 0 %
Eosinophils Absolute: 0 10*3/uL (ref 0.0–0.5)
Eosinophils Relative: 0 %
HCT: 25.5 % — ABNORMAL LOW (ref 36.0–46.0)
Hemoglobin: 8.3 g/dL — ABNORMAL LOW (ref 12.0–15.0)
Immature Granulocytes: 0 %
Lymphocytes Relative: 7 %
Lymphs Abs: 0.9 10*3/uL (ref 0.7–4.0)
MCH: 25.5 pg — ABNORMAL LOW (ref 26.0–34.0)
MCHC: 32.5 g/dL (ref 30.0–36.0)
MCV: 78.5 fL — ABNORMAL LOW (ref 80.0–100.0)
Monocytes Absolute: 0.9 10*3/uL (ref 0.1–1.0)
Monocytes Relative: 7 %
Neutro Abs: 10.7 10*3/uL — ABNORMAL HIGH (ref 1.7–7.7)
Neutrophils Relative %: 86 %
Platelets: 376 10*3/uL (ref 150–400)
RBC: 3.25 MIL/uL — ABNORMAL LOW (ref 3.87–5.11)
RDW: 15.6 % — ABNORMAL HIGH (ref 11.5–15.5)
WBC: 12.5 10*3/uL — ABNORMAL HIGH (ref 4.0–10.5)
nRBC: 0 % (ref 0.0–0.2)

## 2023-02-04 LAB — BPAM FFP
Blood Product Expiration Date: 202501272359
Blood Product Expiration Date: 202501302359
ISSUE DATE / TIME: 202501250409
ISSUE DATE / TIME: 202501250608
Unit Type and Rh: 7300
Unit Type and Rh: 7300

## 2023-02-04 LAB — MAGNESIUM: Magnesium: 2.7 mg/dL — ABNORMAL HIGH (ref 1.7–2.4)

## 2023-02-04 LAB — GLUCOSE, CAPILLARY
Glucose-Capillary: 116 mg/dL — ABNORMAL HIGH (ref 70–99)
Glucose-Capillary: 132 mg/dL — ABNORMAL HIGH (ref 70–99)
Glucose-Capillary: 149 mg/dL — ABNORMAL HIGH (ref 70–99)
Glucose-Capillary: 165 mg/dL — ABNORMAL HIGH (ref 70–99)

## 2023-02-04 MED ORDER — SMOG ENEMA
400.0000 mL | Freq: Every day | RECTAL | Status: AC
  Administered 2023-02-04 – 2023-02-05 (×2): 400 mL via RECTAL
  Filled 2023-02-04 (×3): qty 960

## 2023-02-04 MED ORDER — TRAMADOL HCL 50 MG PO TABS: 50.0000 mg | ORAL_TABLET | Freq: Four times a day (QID) | ORAL | Status: DC | PRN

## 2023-02-04 MED ORDER — BOOST / RESOURCE BREEZE PO LIQD CUSTOM
1.0000 | Freq: Three times a day (TID) | ORAL | Status: DC
  Administered 2023-02-05 – 2023-02-07 (×4): 1 via ORAL

## 2023-02-04 MED ORDER — ACETAMINOPHEN 325 MG PO TABS
650.0000 mg | ORAL_TABLET | Freq: Four times a day (QID) | ORAL | Status: DC | PRN
  Administered 2023-02-04: 650 mg via ORAL
  Filled 2023-02-04: qty 2

## 2023-02-04 NOTE — Progress Notes (Signed)
PHARMACY - PHYSICIAN COMMUNICATION CRITICAL VALUE ALERT - BLOOD CULTURE IDENTIFICATION (BCID)  Wanda [name] is an 83 y.o. female who presented to [facility] on 02/02/2023   Name of physician (or Provider) Contacted: Dr. Arlean Hopping   Current antibiotics: Zosyn  Changes to prescribed antibiotics recommended:  No changes  Results for orders placed or performed during the hospital encounter of 02/02/23  Blood Culture ID Panel (Reflexed) (Collected: 02/03/2023  3:21 AM)  Result Value Ref Range   Enterococcus faecalis NOT DETECTED NOT DETECTED   Enterococcus Faecium NOT DETECTED NOT DETECTED   Listeria monocytogenes NOT DETECTED NOT DETECTED   Staphylococcus species DETECTED (A) NOT DETECTED   Staphylococcus aureus (BCID) NOT DETECTED NOT DETECTED   Staphylococcus epidermidis NOT DETECTED NOT DETECTED   Staphylococcus lugdunensis NOT DETECTED NOT DETECTED   Streptococcus species NOT DETECTED NOT DETECTED   Streptococcus agalactiae NOT DETECTED NOT DETECTED   Streptococcus pneumoniae NOT DETECTED NOT DETECTED   Streptococcus pyogenes NOT DETECTED NOT DETECTED   A.calcoaceticus-baumannii NOT DETECTED NOT DETECTED   Bacteroides fragilis NOT DETECTED NOT DETECTED   Enterobacterales NOT DETECTED NOT DETECTED   Enterobacter cloacae complex NOT DETECTED NOT DETECTED   Escherichia coli NOT DETECTED NOT DETECTED   Klebsiella aerogenes NOT DETECTED NOT DETECTED   Klebsiella oxytoca NOT DETECTED NOT DETECTED   Klebsiella pneumoniae NOT DETECTED NOT DETECTED   Proteus species NOT DETECTED NOT DETECTED   Salmonella species NOT DETECTED NOT DETECTED   Serratia marcescens NOT DETECTED NOT DETECTED   Haemophilus influenzae NOT DETECTED NOT DETECTED   Neisseria meningitidis NOT DETECTED NOT DETECTED   Pseudomonas aeruginosa NOT DETECTED NOT DETECTED   Stenotrophomonas maltophilia NOT DETECTED NOT DETECTED   Candida albicans NOT DETECTED NOT DETECTED   Candida auris NOT DETECTED NOT DETECTED    Candida glabrata NOT DETECTED NOT DETECTED   Candida krusei NOT DETECTED NOT DETECTED   Candida parapsilosis NOT DETECTED NOT DETECTED   Candida tropicalis NOT DETECTED NOT DETECTED   Cryptococcus neoformans/gattii NOT DETECTED NOT DETECTED    Abran Duke 02/04/2023  4:52 AM

## 2023-02-04 NOTE — Consult Note (Cosign Needed Addendum)
Consultation Note Date: 02/04/2023   Patient Name: Wanda [name]  DOB: 1940-06-22  MRN: [id number]  Age / Sex: 83 y.o., female  PCP: Copland, Gwenlyn Found, MD Referring Physician: Glade Lloyd, MD  Reason for Consultation: Establishing goals of care  HPI/Patient Profile: 83 y.o. female   admitted on 02/02/2023 with past medical history of  HLD, T2DM, severe protein calorie deficient malnutrition, anxiety and depression, history of DVT.  She is a nursing home resident.  She was sent in due to anemia.  Blood noted in patient's stool.  CBC done revealed marked anemia.   In the ER patient's vital stable.  Hemoglobin was 6.2.  Patient fecal occult blood positive with dark stool.  INR 2.47.  Chest x-ray questions metastatic cancer versus multiple myeloma.  Patient quite somnolent difficult to arouse.   Patient is a resident of [facility] for the past year and a half.  Patient is without medical decision making capacity.  Family face treatment option decisions, advanced directive decisions and anticipatory care needs.     Clinical Assessment and Goals of Care:  This NP Lorinda Creed reviewed medical records, received report from team, assessed the patient and theno discuss diagnosis, prognosis, GOC, EOL wishes disposition and options.   Concept of Palliative Care was introduced as specialized medical care for people and their families living with serious illness.  If focuses on providing relief from the symptoms and stress of a serious illness.  The goal is to improve quality of life for both the patient and the family.   Values and goals of care important to patient and family were attempted to be elicited.  Created space and opportunity for family to explore thoughts and feelings regarding current medical situation.    Daughter verbalizes an understanding of patient's continued physical, functional and  cognitive decline over the past many months.  She has continued weight loss, she speaks to the fact that her mother has refused colonoscopy her whole life.  Education offered on patient's current medical situation specific to overall failure to thrive, history of CVA/decreased mobility, protein  calorie malnutrition/albumin 2.8 now new GI complication resulting in low hemoglobin and heme positive stool.  Daughter verbalizes understanding of all the above.     A  discussion was had today regarding advanced directives.  Concepts specific to code status, artifical feeding and hydration, continued IV antibiotics and rehospitalization was had.    The difference between a aggressive medical intervention path  and a palliative comfort care path for this patient at this time was had.    Education offered on hospice benefit; philosophy and eligibility.  Education offered on the difference between hospice services at [facility] versus inpatient hospice unit   Natural trajectory and expectations at EOL were discussed.  Questions and concerns addressed.  Patient  encouraged to call with questions or concerns.     PMT will continue to support holistically.           Plan is to meet tomorrow morning at 1030 with daughter  for further clarification of goals of care.  She is leaning towards a comfort approach.            Later in the afternoon- spoke to daughter by phone and decision is to begin shift to a full comfort path.  Stop labs, diagnostics, IV antibiotics, fluids.   Focus of care is comfort.   Plan is still to meet int he morning at 10:00 am     Discussed with treatment team via secure chat    Patient's daughter/Kathy Ailene Rud is next of kin and primary decision maker.     SUMMARY OF RECOMMENDATIONS    Code Status/Advance Care Planning: DNR/DNI No artificial feeding or hydration now or int he future, stop IV fluids No further IV antibiotics No further diagnostics, stop labs, scans,  xrays   Palliative Prophylaxis:  Aspiration, Bowel Regimen, Delirium Protocol, and Frequent Pain Assessment  Additional Recommendations (Limitations, Scope, Preferences): Full Scope Treatment  Psycho-social/Spiritual:  Desire for further Chaplaincy support:yes Additional Recommendations: Education on Hospice  Prognosis:  Unable to determine  Discharge Planning: To Be Determined      Primary Diagnoses: Present on Admission:  Hyperlipidemia with target LDL less than 70  Essential hypertension  Protein-calorie malnutrition, severe  Stroke (cerebrum) (HCC)   I have reviewed the medical record, interviewed the patient and family, and examined the patient. The following aspects are pertinent.  Past Medical History:  Diagnosis Date   Arthritis    Carotid stenosis 11/2018   Cataract    surgery to remove   Diabetes mellitus    type 2   Hyperlipidemia    Hypertension    Skin cancer    Removed from face   Stroke [facility]) 09/19/2018   Urgency of urination    Urinary leakage    Social History   Socioeconomic History   Marital status: Widowed    Spouse name: Not on file   Number of children: Not on file   Years of education: Not on file   Highest education level: Not on file  Occupational History   Not on file  Tobacco Use   Smoking status: Never   Smokeless tobacco: Never  Vaping Use   Vaping status: Never Used  Substance and Sexual Activity   Alcohol use: Yes    Alcohol/week: 1.0 standard drink of alcohol    Types: 1 Standard drinks or equivalent per week    Comment: maybe once per month - 3 drinks   Drug use: No   Sexual activity: Not Currently    Birth control/protection: Post-menopausal    Comment: Hysterectomy  Other Topics Concern   Not on file  Social History Narrative   Widowed. Education: [employer]. Exercise: 2 times a week for 30 minutes.   Social Drivers of Corporate investment banker Strain: Low Risk  (03/18/2020)   Overall Financial Resource  Strain (CARDIA)    Difficulty of Paying Living Expenses: Not hard at all  Food Insecurity: No Food Insecurity (02/03/2023)   Hunger Vital Sign    Worried About Running Out of Food in the Last Year: Never true    Ran Out of Food in the Last Year: Never true  Transportation Needs: No Transportation Needs (02/03/2023)   PRAPARE - Administrator, Civil Service (Medical): No    Lack of Transportation (Non-Medical): No  Physical Activity: Inactive (03/18/2020)   Exercise Vital Sign    Days of Exercise per Week: 0 days    Minutes of Exercise per Session:  0 min  Stress: No Stress Concern Present (03/18/2020)   [employer] of Occupational Health - Occupational Stress Questionnaire    Feeling of Stress : Not at all  Social Connections: Unknown (02/03/2023)   Social Connection and Isolation Panel [NHANES]    Frequency of Communication with Friends and Family: Patient unable to answer    Frequency of Social Gatherings with Friends and Family: Patient unable to answer    Attends Religious Services: Never    Database administrator or Organizations: No    Attends Banker Meetings: Never    Marital Status: Widowed   Family History  Problem Relation Age of Onset   Cancer Mother        liver   Breast cancer Mother 47   Pancreatitis Father        deceased 22   Cancer Brother        GI   Coronary artery disease Paternal Grandmother    Cancer Son        terminal kidney   Breast cancer Maternal Aunt    Cancer Other    Coronary artery disease Paternal Uncle    Scheduled Meds:  insulin aspart  0-15 Units Subcutaneous Q4H   milk and molasses  1 enema Rectal Once   pantoprazole (PROTONIX) IV  40 mg Intravenous Q12H   polyethylene glycol  17 g Oral BID   SMOG  400 mL Rectal BID   Continuous Infusions:  sodium chloride 50 mL/hr at 02/04/23 0031   piperacillin-tazobactam (ZOSYN)  IV 3.375 g (02/04/23 0537)   PRN Meds:.dextrose Medications Prior to Admission:  Prior  to Admission medications   Medication Sig Start Date End Date Taking? Authorizing Provider  acetaminophen (TYLENOL) 325 MG tablet Take 650 mg by mouth in the morning and at bedtime.    [provider]  Amino Acids-Protein Hydrolys (PRO-STAT PO) Take 30 mLs by mouth daily.    [provider]  amLODipine (NORVASC) 5 MG tablet TAKE 1 TABLET(5 MG) BY MOUTH DAILY Patient taking differently: Take 5 mg by mouth daily. 03/16/21   Copland, Gwenlyn Found, MD  blood glucose meter kit and supplies Dispense based on patient and insurance preference. Pt just needs meter 02/26/17   Copland, Gwenlyn Found, MD  Blood Glucose Monitoring Suppl (ONE TOUCH ULTRA MINI) w/Device KIT Use to test blood sugar daily as instructed. Dx: E11.65 01/26/21   Copland, Gwenlyn Found, MD  ezetimibe (ZETIA) 10 MG tablet Take 1 tablet (10 mg total) by mouth daily. 01/05/22   Rai, Ripudeep K, MD  glipiZIDE (GLUCOTROL XL) 5 MG 24 hr tablet Take 2 tabs in the morning and 1 tab in the evening. Patient not taking: Reported on 05/09/2022 03/16/21   Copland, Gwenlyn Found, MD  glucose blood (ONE TOUCH ULTRA TEST) test strip Test blood sugar 3 times a day. Dx code: 250.00 03/16/21   Copland, Gwenlyn Found, MD  glucose blood (ONETOUCH ULTRA) test strip Use as instructed 06/08/21   Copland, Gwenlyn Found, MD  insulin aspart (NOVOLOG) 100 UNIT/ML injection Inject 3 Units into the skin 3 (three) times daily with meals. Patient taking differently: Inject 2-6 Units into the skin daily. Inject as per sliding scale: If 200-280= 2 units 281-320= 4 units 321-400= 6 units 07/29/21   Dahal, Melina Schools, MD  insulin glargine-yfgn (SEMGLEE) 100 UNIT/ML injection Inject 0.33 mLs (33 Units total) into the skin daily. Patient taking differently: Inject 10 Units into the skin every evening. 01/04/22   Rai, Ripudeep K,  MD  insulin glargine-yfgn (SEMGLEE, YFGN,) 100 UNIT/ML injection Inject 20 Units into the skin daily.    [provider]  Insulin Pen Needle (BD PEN NEEDLE  NANO U/F) 32G X 4 MM MISC USE TO INJECT INSULIN ONCE DAILY 08/31/16   Copland, Gwenlyn Found, MD  Insulin Syringe-Needle U-100 (INSULIN SYRINGE .5CC/31GX5/16") 31G X 5/16" 0.5 ML MISC Use to inject insulin 1 time daily. 09/23/14   Carlus Pavlov, MD  Lancets MISC 1 each by Does not apply route 3 (three) times daily. Dx: E11.65 01/26/21   Copland, Gwenlyn Found, MD  magnesium oxide (MAG-OX) 400 (240 Mg) MG tablet Take 1 tablet (400 mg total) by mouth daily. 01/05/22   Rai, Delene Ruffini, MD  metoprolol tartrate (LOPRESSOR) 25 MG tablet Take 0.5 tablets (12.5 mg total) by mouth 2 (two) times daily. 01/04/22   Rai, Delene Ruffini, MD  Multiple Vitamin (MULTIVITAMIN WITH MINERALS) TABS tablet Take 1 tablet by mouth daily. 01/05/22   Rai, Ripudeep K, MD  polyethylene glycol (MIRALAX / GLYCOLAX) 17 g packet Take 17 g by mouth daily as needed for moderate constipation. 01/04/22   Rai, Delene Ruffini, MD  rivaroxaban (XARELTO) 20 MG TABS tablet Take 1 tablet (20 mg total) by mouth daily with supper. 01/04/22   Rai, Ripudeep K, MD  senna-docusate (SENOKOT-S) 8.6-50 MG tablet Take 2 tablets by mouth at bedtime. 01/04/22   Rai, Ripudeep K, MD  sitaGLIPtin (JANUVIA) 25 MG tablet Take 25 mg by mouth every morning.    [provider]  venlafaxine (EFFEXOR) 37.5 MG tablet Take 37.5 mg by mouth every morning.    [provider]  venlafaxine XR (EFFEXOR-XR) 75 MG 24 hr capsule Take 1 capsule (75 mg total) by mouth daily with breakfast. Patient not taking: Reported on 05/09/2022 01/05/22   Rai, Delene Ruffini, MD  lisinopril (PRINIVIL,ZESTRIL) 20 MG tablet Take 20 mg by mouth daily.   03/23/11  [provider]   Allergies  Allergen Reactions   Ace Inhibitors Diarrhea, Swelling, Other (See Comments) and Cough    Pt had cough and diarrhea with first few doses of medication; also had swelling of right eyelid. Stopped medication on 03/17/11.   Alendronate Sodium Other (See Comments)    "Made my whole body hurt"    Aspirin Hives   Crestor [Rosuvastatin] Other (See Comments)    "Made my whole body hurt"   Lipitor [Atorvastatin Calcium] Other (See Comments)    "Made my whole body hurt"   Metformin And Related     Severe abdominal pain   Shellfish Allergy Nausea Only    Intolerant of fresh shellfish, reports the reaction is GI upset, denies hives, denies any swelling  Reports that she can tolerate canned seafood.    Statins Other (See Comments)    myalgia   Review of Systems  Unable to perform ROS: Patient nonverbal    Physical Exam  Vital Signs: BP (!) 124/59 (BP Location: Right Arm)   Pulse 81   Temp 98 F (36.7 C)   Resp 18   Ht 5\' 3"  (1.6 m)   Wt 44.9 kg   LMP  (LMP Unknown)   SpO2 100%   BMI 17.53 kg/m  Pain Scale: PAINAD   Pain Score: 2    SpO2: SpO2: 100 % O2 Device:SpO2: 100 % O2 Flow Rate: .   IO: Intake/output summary:  Intake/Output Summary (Last 24 hours) at 02/04/2023 0839 Last data filed at 02/03/2023 1711 Gross per 24 hour  Intake --  Output 1 ml  Net -1 ml    LBM:   Baseline Weight: Weight: 65.8 kg Most recent weight: Weight: 44.9 kg     Palliative Assessment/Data:  30 %     Time  75 minutes  Signed by: Lorinda Creed, NP   Please contact Palliative Medicine Team phone at [phone] for questions and concerns.  For individual provider: See Loretha Stapler

## 2023-02-04 NOTE — Progress Notes (Signed)
PROGRESS NOTE    Wanda [name]  ZOX:[id number] DOB: 05/23/1940 DOA: 02/02/2023 PCP: Pearline Cables, MD   Brief Narrative:  83 year old female with history of hypertension, hyperlipidemia, diabetes mellitus type 2, severe protein calorie malnutrition, anxiety and depression, DVT on Xarelto, unspecified prior CVA with hemiplegia and nonverbal state was sent from her facility because of anemia and apparently blood was noted in her stool.  On presentation, hemoglobin was 6.2 with positive fecal occult blood and INR of 2.47.  Chest x-ray showed questionable metastatic cancer versus myeloma.  She was started on IV Protonix.  Packed red cell transfusion was ordered.  GI consulted.  Palliative care also has been consulted for goals of care discussion.  Assessment & Plan:   Probable GI bleed presenting with acute blood loss anemia -Presented with hemoglobin of 6.2 with fecal occult blood testing positive.  Unclear if this is all from subacute GI blood loss versus underlying hematologic disorder/myeloma -Status post 1 unit packed red cell transfusion.  Hemoglobin pending today.  Monitor H&H.  Continue Protonix IV.  GI following: Recommending palliative care/hospice care -Xarelto on hold  Left upper lobe pneumonia -CT chest showing patchy groundglass opacities in the left upper lobe.  Currently on room air.  Continue IV antibiotics.  Aspiration precautions.  SLP evaluation   Fecal impaction and concern for rectal malignancy versus stercoral colitis -As seen on imaging.  No evidence of metastatic disease in the chest abdomen or pelvis. -GI following: Laxatives as per GI  Coagulopathy?  Secondary to Xarelto use -INR 2.4 on presentation.  Status post 2 units FFP's transfusion  Multinodular goiter -Seen on CT of chest.  Outpatient follow-up  Lesions in the pancreatic body and tail -As seen on CT: Might represent sidebranch IPMNs.  Recommended outpatient MRI of abdomen imaging here.  Follow GI  recommendations  Essential hypertension Hyperlipidemia -Monitor blood pressure.  Use IV antihypertensives if needed.  Zetia on hold  Anxiety and depression -Effexor on hold  Diabetes mellitus type 2 with hyperglycemia and hypoglycemia -A1c 7.4.  Continue CBGs with SSI  History of DVT -Xarelto on hold  Severe protein calorie malnutrition -Consult nutrition once started on diet  History of unspecified stroke with residual hemiplegia and nonverbal state Goals of care -Fall precautions.  Palliative care consultation for goals of care discussion  DVT prophylaxis: SCDs Code Status: DNR as per documentation sent from facility Family Communication: None at bedside Disposition Plan: Status is: Inpatient Remains inpatient appropriate because: Of severity of illness  Consultants: GI/palliative care  Procedures: None  Antimicrobials: Zosyn from 02/02/2023 onwards   Subjective: Patient seen and examined at bedside.  No agitation, seizures, vomiting reported. Objective: Vitals:   02/03/23 1427 02/03/23 1634 02/03/23 2041 02/04/23 0407  BP:  (!) 131/58 129/65 (!) 124/59  Pulse:  78 92 81  Resp:  (!) 22 18 18   Temp:  98.2 F (36.8 C) 98.5 F (36.9 C) 98 F (36.7 C)  TempSrc:  Axillary    SpO2:  100% 98% 100%  Weight: 65.7 kg   44.9 kg  Height: 5\' 3"  (1.6 m)       Intake/Output Summary (Last 24 hours) at 02/04/2023 0736 Last data filed at 02/03/2023 1711 Gross per 24 hour  Intake 234 ml  Output 1 ml  Net 233 ml   Filed Weights   02/03/23 0200 02/03/23 1427 02/04/23 0407  Weight: 65.8 kg 65.7 kg 44.9 kg    Examination:  General: Currently on room air.  No distress.  Chronically ill and deconditioned looking ENT/neck: No thyromegaly.  JVD is not elevated  respiratory: Decreased breath sounds at bases bilaterally with some crackles; no wheezing  CVS: S1-S2 heard, rate controlled currently Abdominal: Soft, nontender, slightly distended; no organomegaly, bowel sounds  are heard Extremities: Trace lower extremity edema; no cyanosis  CNS: Wakes up slightly, remains nonverbal.   Lymph: No obvious lymphadenopathy Skin: No obvious ecchymosis/lesions  psych: Showing no signs of agitation.  Mostly flat affect.   Musculoskeletal: No obvious joint swelling/deformity   Data Reviewed: I have personally reviewed following labs and imaging studies  CBC: Recent Labs  Lab 02/02/23 1940 02/03/23 0109 02/03/23 0939  WBC 9.6  --   --   NEUTROABS 6.6  --   --   HGB 6.2* 8.2* 7.6*  HCT 21.2* 26.1* 23.8*  MCV 79.7*  --   --   PLT 401*  --   --    Basic Metabolic Panel: Recent Labs  Lab 02/02/23 1940  NA 142  K 4.1  CL 109  CO2 24  GLUCOSE 115*  BUN 25*  CREATININE 0.97  CALCIUM 9.7   GFR: Estimated Creatinine Clearance: 31.1 mL/min (by C-G formula based on SCr of 0.97 mg/dL). Liver Function Tests: Recent Labs  Lab 02/02/23 1940  AST 18  ALT 15  ALKPHOS 57  BILITOT 0.4  PROT 6.6  ALBUMIN 3.0*   No results for input(s): "LIPASE", "AMYLASE" in the last 168 hours. Recent Labs  Lab 02/03/23 0030  AMMONIA 27   Coagulation Profile: Recent Labs  Lab 02/02/23 1940  INR 2.4*   Cardiac Enzymes: No results for input(s): "CKTOTAL", "CKMB", "CKMBINDEX", "TROPONINI" in the last 168 hours. BNP (last 3 results) No results for input(s): "PROBNP" in the last 8760 hours. HbA1C: Recent Labs    02/02/23 2140  HGBA1C 7.4*   CBG: Recent Labs  Lab 02/03/23 1214 02/03/23 1638 02/03/23 2043 02/04/23 0008 02/04/23 0409  GLUCAP 67* 86 72 165* 149*   Lipid Profile: No results for input(s): "CHOL", "HDL", "LDLCALC", "TRIG", "CHOLHDL", "LDLDIRECT" in the last 72 hours. Thyroid Function Tests: No results for input(s): "TSH", "T4TOTAL", "FREET4", "T3FREE", "THYROIDAB" in the last 72 hours. Anemia Panel: Recent Labs    02/02/23 2140  VITAMINB12 850  FOLATE 28.2  FERRITIN 16  TIBC 367  IRON 15*  RETICCTPCT 1.4   Sepsis Labs: No results for  input(s): "PROCALCITON", "LATICACIDVEN" in the last 168 hours.  Recent Results (from the past 240 hours)  Culture, blood (Routine X 2) w Reflex to ID Panel     Status: None (Preliminary result)   Collection Time: 02/03/23  3:21 AM   Specimen: BLOOD  Result Value Ref Range Status   Specimen Description BLOOD BLOOD LEFT HAND  Final   Special Requests   Final    BOTTLES DRAWN AEROBIC AND ANAEROBIC Blood Culture adequate volume   Culture  Setup Time   Final    GRAM POSITIVE COCCI IN CLUSTERS AEROBIC BOTTLE ONLY CRITICAL RESULT CALLED TO, READ BACK BY AND VERIFIED WITH: PHARMD J LEDFORD 02/04/2023 @ 0114 BY AB Performed at [facility], [address] [place], [place] [address]    Culture GRAM POSITIVE COCCI  Final   Report Status PENDING  Incomplete  Culture, blood (Routine X 2) w Reflex to ID Panel     Status: None (Preliminary result)   Collection Time: 02/03/23  3:21 AM   Specimen: BLOOD  Result Value Ref Range Status   Specimen Description BLOOD  BLOOD LEFT ARM  Final   Special Requests   Final    BOTTLES DRAWN AEROBIC AND ANAEROBIC Blood Culture adequate volume   Culture  Setup Time   Final    GRAM POSITIVE COCCI IN CLUSTERS AEROBIC BOTTLE ONLY CRITICAL VALUE NOTED.  VALUE IS CONSISTENT WITH PREVIOUSLY REPORTED AND CALLED VALUE. Performed at [facility], [address], [place], [place] [address]    Culture GRAM POSITIVE COCCI  Final   Report Status PENDING  Incomplete  Blood Culture ID Panel (Reflexed)     Status: Abnormal   Collection Time: 02/03/23  3:21 AM  Result Value Ref Range Status   Enterococcus faecalis NOT DETECTED NOT DETECTED Final   Enterococcus Faecium NOT DETECTED NOT DETECTED Final   Listeria monocytogenes NOT DETECTED NOT DETECTED Final   Staphylococcus species DETECTED (A) NOT DETECTED Final    Comment: CRITICAL RESULT CALLED TO, READ BACK BY AND VERIFIED WITH: PHARMD J LEDFORD 02/04/2023 @ 0114 BY AB    Staphylococcus aureus (BCID) NOT  DETECTED NOT DETECTED Final   Staphylococcus epidermidis NOT DETECTED NOT DETECTED Final   Staphylococcus lugdunensis NOT DETECTED NOT DETECTED Final   Streptococcus species NOT DETECTED NOT DETECTED Final   Streptococcus agalactiae NOT DETECTED NOT DETECTED Final   Streptococcus pneumoniae NOT DETECTED NOT DETECTED Final   Streptococcus pyogenes NOT DETECTED NOT DETECTED Final   A.calcoaceticus-baumannii NOT DETECTED NOT DETECTED Final   Bacteroides fragilis NOT DETECTED NOT DETECTED Final   Enterobacterales NOT DETECTED NOT DETECTED Final   Enterobacter cloacae complex NOT DETECTED NOT DETECTED Final   Escherichia coli NOT DETECTED NOT DETECTED Final   Klebsiella aerogenes NOT DETECTED NOT DETECTED Final   Klebsiella oxytoca NOT DETECTED NOT DETECTED Final   Klebsiella pneumoniae NOT DETECTED NOT DETECTED Final   Proteus species NOT DETECTED NOT DETECTED Final   Salmonella species NOT DETECTED NOT DETECTED Final   Serratia marcescens NOT DETECTED NOT DETECTED Final   Haemophilus influenzae NOT DETECTED NOT DETECTED Final   Neisseria meningitidis NOT DETECTED NOT DETECTED Final   Pseudomonas aeruginosa NOT DETECTED NOT DETECTED Final   Stenotrophomonas maltophilia NOT DETECTED NOT DETECTED Final   Candida albicans NOT DETECTED NOT DETECTED Final   Candida auris NOT DETECTED NOT DETECTED Final   Candida glabrata NOT DETECTED NOT DETECTED Final   Candida krusei NOT DETECTED NOT DETECTED Final   Candida parapsilosis NOT DETECTED NOT DETECTED Final   Candida tropicalis NOT DETECTED NOT DETECTED Final   Cryptococcus neoformans/gattii NOT DETECTED NOT DETECTED Final    Comment: Performed at [facility], [address] [place], [place] [address]         Radiology Studies: DG Abd 2 Views Result Date: 02/04/2023 CLINICAL DATA:  Possible fecal impaction EXAM: ABDOMEN - 2 VIEW COMPARISON:  12/29/2021 FINDINGS: Scattered large and small bowel gas is noted. No obstructive changes  are seen. Mild retained fecal material is noted within the rectum which may represent some early impaction. Bladder is well distended with contrast opacified urine. No free air is seen. No bony abnormality is noted. IMPRESSION: Retained fecal material within the rectum which may represent an early impaction. Electronically Signed   By: Alcide Clever M.D.   On: 02/04/2023 00:59   CT CHEST ABDOMEN PELVIS W CONTRAST Result Date: 02/03/2023 CLINICAL DATA:  Metastatic colon cancer staging. EXAM: CT CHEST, ABDOMEN, AND PELVIS WITH CONTRAST TECHNIQUE: Multidetector CT imaging of the chest, abdomen and pelvis was performed following the standard protocol during bolus  administration of intravenous contrast. RADIATION DOSE REDUCTION: This exam was performed according to the departmental dose-optimization program which includes automated exposure control, adjustment of the mA and/or kV according to patient size and/or use of iterative reconstruction technique. CONTRAST:  75mL OMNIPAQUE IOHEXOL 350 MG/ML SOLN COMPARISON:  CT abdomen and pelvis 11/30/2021. FINDINGS: CT CHEST FINDINGS Cardiovascular: No significant vascular findings. Normal heart size. No pericardial effusion. There are atherosclerotic calcifications of the aorta. Mediastinum/Nodes: The thyroid gland is heterogeneous and enlarged containing numerous nodules. The largest nodular densities in the isthmus measuring up to 2.8 cm. There are no enlarged mediastinal, hilar or axillary lymph nodes. Visualized esophagus is within normal limits. Lungs/Pleura: There is atelectasis in the bilateral lower lobes, right greater than left. Patchy ground-glass opacities in the left upper lobe are nonspecific. There is no pleural effusion or pneumothorax. There some secretions in the trachea. Musculoskeletal: No acute osseous findings. No suspicious osseous lesion identified. T11 vertebral body hemangioma is present. CT ABDOMEN PELVIS FINDINGS Hepatobiliary: No focal liver  abnormality is seen. No gallstones, gallbladder wall thickening, or biliary dilatation. Pancreas: Low-attenuation lesions are seen in the body and tail of the pancreas measuring up to 1 cm. These have mildly increased in size in the tail of the pancreas. There is no pancreatic ductal dilatation or surrounding inflammation. Spleen: Normal in size without focal abnormality. Adrenals/Urinary Tract: Adrenal glands are unremarkable. There is no hydronephrosis. There is a left renal cyst measuring 15 mm. Bladder is unremarkable. Stomach/Bowel: There circumferential distal rectal wall thickening. The rectum is dilated and stool-filled with mild surrounding inflammation. No dilated bowel loops are seen. The appendix is within normal limits. Small bowel and stomach are within normal limits. Vascular/Lymphatic: Aortic atherosclerosis. No enlarged abdominal or pelvic lymph nodes. Reproductive: Status post hysterectomy. No adnexal masses. Other: There is presacral edema. There is no ascites or focal abdominal wall hernia. There is mild body wall edema. Musculoskeletal: No acute fracture or focal osseous lesion identified. The bones are diffusely osteopenic. IMPRESSION: 1. Circumferential distal rectal wall thickening with surrounding inflammation and presacral edema. Findings are suspicious for rectal malignancy. 2. There is rectal fecal impaction and findings suspicious for stercoral colitis. 3. No evidence for metastatic disease in the chest, abdomen or pelvis. 4. Patchy ground-glass opacities in the left upper lobe are nonspecific and may be infectious/inflammatory. 5. Low-attenuation lesions in the body and tail of the pancreas measuring up to 1 cm. These have mildly increased in size in the tail of the pancreas. Findings may represent side branch IPMNs. Recommend follow-up MRI in 1 year. 6. Multinodular goiter.  Recommend nonemergent thyroid ultrasound. 7. Mild body wall edema. 8. Left Bosniak I benign renal cyst measuring  1.5 cm. No follow-up imaging is recommended. JACR 2018 Feb; 264-273, Management of the Incidental Renal Mass on CT, [employer] 2021; [id number], Bosniak Classification of Cystic Renal Masses, Version 2019. Aortic Atherosclerosis (ICD10-I70.0). Electronically Signed   By: Darliss Cheney M.D.   On: 02/03/2023 02:17   DG Chest 1 View Result Date: 02/02/2023 CLINICAL DATA:  Sepsis EXAM: CHEST  1 VIEW COMPARISON:  12/26/2021 FINDINGS: Low lung volumes. No consolidation or effusion. Stable cardiomediastinal silhouette with aortic atherosclerosis. No pneumothorax. Interval permeative appearance of the shafts of the left greater than right humerus. IMPRESSION: 1. Low lung volumes. No active disease. 2. Interval permeative appearance of the shafts of the left greater than right humerus, appearance is suspicious for metastatic disease or marrow disease such as myeloma. Electronically Signed   By: Selena Batten  Jake Samples M.D.   On: 02/02/2023 20:03        Scheduled Meds:  insulin aspart  0-15 Units Subcutaneous Q4H   milk and molasses  1 enema Rectal Once   pantoprazole (PROTONIX) IV  40 mg Intravenous Q12H   polyethylene glycol  17 g Oral BID   SMOG  400 mL Rectal BID   Continuous Infusions:  sodium chloride 50 mL/hr at 02/04/23 0031   piperacillin-tazobactam (ZOSYN)  IV 3.375 g (02/04/23 0537)          Glade Lloyd, MD [facility] 02/04/2023, 7:36 AM

## 2023-02-04 NOTE — TOC Initial Note (Signed)
Transition of Care [facility]) - Initial/Assessment Note    Patient Details  Name: Wanda [name] MRN: [id number] Date of Birth: 04-08-1940  [facility] CM/SW Contact:    Deatra Robinson, [place] Phone Number: 02/04/2023, 1:21 PM  Clinical Narrative:   Spoke to pt's dtr Olegario Messier who confirmed pt is a LTC resident at [facility]. Dtr reports they are considering comfort/hospice and plan is to meet with Palliative Medicine at 10am tomorrow. Discussed hospice services at [facility] vs a hospice home, dtr is open to both but prefers a hospice home if pt meets criteria. Discussed hospice facilities and dtr requesting [facility] if appropriate. Will follow and assist as indicated.   Dellie Burns, MSW, LCSW [phone] (coverage)          Expected Discharge Plan: Skilled Nursing Facility Barriers to Discharge: Continued Medical Work up   Patient Goals and CMS Choice            Expected Discharge Plan and Services     Post Acute Care Choice: Skilled Nursing Facility Living arrangements for the past 2 months: Skilled Nursing Facility                                      Prior Living Arrangements/Services Living arrangements for the past 2 months: Skilled Nursing Facility Lives with:: Facility Resident                   Activities of Daily Living   ADL Screening (condition at time of admission) Independently performs ADLs?: No Does the patient have a NEW difficulty with bathing/dressing/toileting/self-feeding that is expected to last >3 days?: Yes (Initiates electronic notice to provider for possible OT consult) Does the patient have a NEW difficulty with getting in/out of bed, walking, or climbing stairs that is expected to last >3 days?: Yes (Initiates electronic notice to provider for possible PT consult) Does the patient have a NEW difficulty with communication that is expected to last >3 days?: No Is the patient deaf or have difficulty  hearing?: No Does the patient have difficulty seeing, even when wearing glasses/contacts?: No Does the patient have difficulty concentrating, remembering, or making decisions?: Yes  Permission Sought/Granted Permission sought to share information with : Facility Industrial/product designer granted to share information with : Yes, Verbal Permission Granted              Emotional Assessment       Orientation: : Oriented to Self, Oriented to Place, Oriented to  Time, Oriented to Situation Alcohol / Substance Use: Not Applicable Psych Involvement: No (comment)  Admission diagnosis:  GI bleed [K92.2] Upper GI bleed [K92.2] Symptomatic anemia [D64.9] On rivaroxaban therapy [Z79.01] Patient Active Problem List   Diagnosis Date Noted   GI bleed 02/02/2023   Anemia 02/02/2023   Protein-calorie malnutrition, severe 01/03/2022   Acute DVT (deep venous thrombosis) (HCC) 12/01/2021   Elevated liver enzymes 12/01/2021   History of CVA (cerebrovascular accident) 12/01/2021   Mood disorder (HCC) 12/01/2021   DVT (deep venous thrombosis) (HCC) 12/01/2021   Hypokalemia 07/26/2021   Fever 07/25/2021   Stroke (cerebrum) (HCC) 07/24/2021   Carotid stenosis 11/13/2018   Brainstem infarct, acute (HCC)    Primary osteoarthritis of left knee 10/04/2015   Osteoporosis 08/23/2015   Multiple thyroid nodules 04/07/2014   Insulin dependent type 2 diabetes mellitus (HCC) 10/09/2013   Insulin adverse reaction 09/27/2013   Senile  nuclear sclerosis 08/14/2012   Essential hypertension 03/10/2011   Hyperlipidemia with target LDL less than 70 03/10/2011   Obesity (BMI 30-39.9) 03/10/2011   PCP:  Pearline Cables, MD Pharmacy:   [facility] - [place], [place] - [phone] [place] [address] Phone: [phone] Fax: [phone]  [facility] [address] [place] [address] Phone: [phone] Fax:  [phone]  [id number] - [place], [place] - [address] AT [facility] [address] [place] [address] Phone: [phone] Fax: [phone]     Social Drivers of Health (SDOH) Social History: SDOH Screenings   Food Insecurity: No Food Insecurity (02/03/2023)  Housing: Low Risk  (02/03/2023)  Transportation Needs: No Transportation Needs (02/03/2023)  Utilities: Not At Risk (02/03/2023)  Alcohol Screen: Low Risk  (03/18/2020)  Depression (PHQ2-9): Low Risk  (03/18/2020)  Financial Resource Strain: Low Risk  (03/18/2020)  Physical Activity: Inactive (03/18/2020)  Social Connections: Unknown (02/03/2023)  Stress: No Stress Concern Present (03/18/2020)  Tobacco Use: Low Risk  (02/03/2023)   SDOH Interventions:     Readmission Risk Interventions     No data to display

## 2023-02-04 NOTE — Evaluation (Signed)
Clinical/Bedside Swallow Evaluation Patient Details  Name: Blu Mcglaun MRN: [id number] Date of Birth: December 23, 1940  Today's Date: 02/04/2023 Time: SLP Start Time (ACUTE ONLY): 0900 SLP Stop Time (ACUTE ONLY): 0915 SLP Time Calculation (min) (ACUTE ONLY): 15 min  Past Medical History:  Past Medical History:  Diagnosis Date   Arthritis    Carotid stenosis 11/2018   Cataract    surgery to remove   Diabetes mellitus    type 2   Hyperlipidemia    Hypertension    Skin cancer    Removed from face   Stroke (HCC) 09/19/2018   Urgency of urination    Urinary leakage    Past Surgical History:  Past Surgical History:  Procedure Laterality Date   ABDOMINAL HYSTERECTOMY  early 80's   total   CAROTID ENDARTERECTOMY Left 11/13/2018   CARPAL TUNNEL RELEASE Bilateral 1980 and 1981   both hands   ENDARTERECTOMY Left 11/13/2018   Procedure: Left Carotid Artery Endarterectomy with Patch Angioplasty;  Surgeon: Larina Earthly, MD;  Location: [facility] OR;  Service: Vascular;  Laterality: Left;   EYE SURGERY     bilateral cataracts   Fatty Tumor Excision     JOINT REPLACEMENT     TONSILLECTOMY     TONSILLECTOMY AND ADENOIDECTOMY  age 83   TOTAL KNEE ARTHROPLASTY Right 10/04/2015   TOTAL KNEE ARTHROPLASTY Right 10/04/2015   Procedure: TOTAL KNEE ARTHROPLASTY;  Surgeon: Jodi Geralds, MD;  Location: [facility] OR;  Service: Orthopedics;  Laterality: Right;   TOTAL KNEE ARTHROPLASTY Left 01/28/2016   Procedure: TOTAL KNEE ARTHROPLASTY;  Surgeon: Jodi Geralds, MD;  Location: [facility] OR;  Service: Orthopedics;  Laterality: Left;   TUBAL LIGATION     HPI:  83yo female admitted from SNF 02/02/23 with anemia. PMH: CVA, nonverbal, DM, HTN, HLD, DVT, anxiety/depression. CXR = matastatic cancer vs multiple myeloma. OK for clear liquid diet from GI perspective.    Assessment / Plan / Recommendation  Clinical Impression  Patient is presenting with clinical s/s of dysphagia as per this bedside swallow evaluation. She is  mostly non-verbal but did verbalize pain when SLP elevating HOB and when asked location of pain, stated "my leg, when you moved it". SLP elevated HOB as much as tolerated by patient. Prior to PO trials, oral care completed via suction toothette sponges and SLP removed some sticky secretions and suspected trace amount of PO's from left bucal cavity. Patient then was receptive to drinking thin liquids (water) via straw sips. She has some bilabial weakness on left and so she would reposition straw in her mouth to get adequate seal. Discoordination and delay of swallow initiation observed at times but no overt s/s aspiration and hyolaryngeal elevation appeared [facility]. Patient did exhibit a few instances of belching after PO intake but this was brief in duration. SLP recommending initiate PO diet of clear liquids/thin consistency as per GI recommendations and can advance to Dys 1 (puree) solids, thin liquids when cleared by GI. SLP will follow for toleration and ability to advance. SLP Visit Diagnosis: Dysphagia, unspecified (R13.10)    Aspiration Risk  Mild aspiration risk    Diet Recommendation Thin liquid;Dysphagia 1 (Puree);Other (Comment) (Dys 1, thin when cleared by GI)    Liquid Administration via: Cup;Straw Medication Administration: Crushed with puree Supervision: Full supervision/cueing for compensatory strategies;Staff to assist with self feeding Compensations: Slow rate;Small sips/bites;Lingual sweep for clearance of pocketing Postural Changes: Seated upright at 90 degrees    Other  Recommendations Oral Care Recommendations: Oral care  BID;Staff/trained caregiver to provide oral care;Oral care before and after PO    Recommendations for follow up therapy are one component of a multi-disciplinary discharge planning process, led by the attending physician.  Recommendations may be updated based on patient status, additional functional criteria and insurance authorization.  Follow up Recommendations  Follow physician's recommendations for discharge plan and follow up therapies      Assistance Recommended at Discharge    Functional Status Assessment Patient has had a recent decline in their functional status and demonstrates the ability to make significant improvements in function in a reasonable and predictable amount of time.  Frequency and Duration min 1 x/week  1 week       Prognosis Prognosis for improved oropharyngeal function: Fair Barriers to Reach Goals: Cognitive deficits;Time post onset      Swallow Study   General Date of Onset: 02/02/23 HPI: 83yo female admitted [facility] with anemia. PMH: CVA, nonverbal, DM, HTN, HLD, DVT, anxiety/depression. CXR = matastatic cancer vs multiple myeloma. OK for clear liquid diet from GI perspective. Type of Study: Bedside Swallow Evaluation Previous Swallow Assessment: BSE 12/28/21 = reg/thin. Diet Prior to this Study: NPO Temperature Spikes Noted: No Respiratory Status: Room air History of Recent Intubation: No Behavior/Cognition: Alert;Cooperative;Pleasant mood Oral Cavity Assessment: Excessive secretions Oral Care Completed by SLP: Yes Oral Cavity - Dentition: Adequate natural dentition Self-Feeding Abilities: Total assist Patient Positioning: Partially reclined Baseline Vocal Quality: Normal Volitional Cough: Cognitively unable to elicit Volitional Swallow: Unable to elicit    Oral/Motor/Sensory Function Overall Oral Motor/Sensory Function: Mild impairment Facial ROM: Reduced left Facial Symmetry: Abnormal symmetry left Facial Strength: Reduced left Lingual Strength: Within Functional Limits   Ice Chips     Thin Liquid Thin Liquid: Impaired Presentation: Straw Oral Phase Impairments: Reduced labial seal Pharyngeal  Phase Impairments: Other (comments);Suspected delayed Swallow (instances of discoordination of swallow)    Nectar Thick     Honey Thick     Puree Puree: Not tested   Solid     Solid: Not tested       Angela Nevin, MA, CCC-SLP Speech Therapy

## 2023-02-04 NOTE — Progress Notes (Addendum)
Patient ID: Wanda [name], female   DOB: Feb 08, 1940, 83 y.o.   MRN: [id number]    Progress Note   Subjective  Day # 2 CC;anemia, heme positive stool, abnormal CT scan suggestive of possible rectal malignancy versus stercoral ulceration  Hemoglobin 6.2 on admission, transfused, x 2 and hemoglobin up to 8.3 today/hematocrit 25.5 Ferritin 16/iron 15/TIBC 367/iron sat 4 Tumor markers pending Multiple  myeloma panel pending  KUB early this a.m. still showed fecal impaction  Patient has not had enemas as yet nursing reports had problems with 1 that was set up from the pharmacy, should get 1 shortly Patient's daughter is at bedside, patient is alert and talking.  Daughter says that is the first time she has talked in a month Counting frequently ( daughter says a sign of pain or anxiety) answers only 1 question, says she is not having any abdominal pain  Daughter relates that patient does have a living will, did not want any aggressive measures.  Speech path is cleared her for clear liquids and then advance to pured with thin liquids as tolerates    Objective   Vital signs in last 24 hours: Temp:  [98 F (36.7 C)-98.5 F (36.9 C)] 98 F (36.7 C) (01/26 0754) Pulse Rate:  [78-92] 86 (01/26 0754) Resp:  [17-22] 17 (01/26 0754) BP: (119-131)/(58-68) 119/68 (01/26 0754) SpO2:  [98 %-100 %] 100 % (01/26 0754) Weight:  [44.9 kg] 44.9 kg (01/26 0407) Last BM Date : 02/04/23 General:    Elderly white female in NAD alert, is able to answer a couple of questions and a few words then goes back to counting Heart:  Regular rate and rhythm; no murmurs Lungs: Respirations even and unlabored, lungs CTA bilaterally Abdomen:  Soft, nontender and nondistended. Normal bowel sounds. Extremities:  Without edema. Neurologic:  Alert , cooperative, talking and counting Psych:  Cooperative. .  Intake/Output from previous day: 01/25 0701 - 01/26 0700 In: 234 [Blood:234] Out: 1  [Urine:1] Intake/Output this shift: Total I/O In: 720 [P.O.:720] Out: 3 [Urine:1; Stool:2]  Lab Results: Recent Labs    02/02/23 1940 02/03/23 0109 02/03/23 0939 02/04/23 0722  WBC 9.6  --   --  12.5*  HGB 6.2* 8.2* 7.6* 8.3*  HCT 21.2* 26.1* 23.8* 25.5*  PLT 401*  --   --  376   BMET Recent Labs    02/02/23 1940 02/04/23 0722  NA 142 143  K 4.1 3.8  CL 109 112*  CO2 24 22  GLUCOSE 115* 128*  BUN 25* 19  CREATININE 0.97 1.05*  CALCIUM 9.7 9.4   LFT Recent Labs    02/04/23 0722  PROT 6.2*  ALBUMIN 2.8*  AST 26  ALT 16  ALKPHOS 55  BILITOT 0.7   PT/INR Recent Labs    02/02/23 1940  LABPROT 26.2*  INR 2.4*    Studies/Results: DG Abd 2 Views Result Date: 02/04/2023 CLINICAL DATA:  Possible fecal impaction EXAM: ABDOMEN - 2 VIEW COMPARISON:  12/29/2021 FINDINGS: Scattered large and small bowel gas is noted. No obstructive changes are seen. Mild retained fecal material is noted within the rectum which may represent some early impaction. Bladder is well distended with contrast opacified urine. No free air is seen. No bony abnormality is noted. IMPRESSION: Retained fecal material within the rectum which may represent an early impaction. Electronically Signed   By: Alcide Clever M.D.   On: 02/04/2023 00:59   CT CHEST ABDOMEN PELVIS W CONTRAST Result Date: 02/03/2023 CLINICAL DATA:  Metastatic colon cancer staging. EXAM: CT CHEST, ABDOMEN, AND PELVIS WITH CONTRAST TECHNIQUE: Multidetector CT imaging of the chest, abdomen and pelvis was performed following the standard protocol during bolus administration of intravenous contrast. RADIATION DOSE REDUCTION: This exam was performed according to the departmental dose-optimization program which includes automated exposure control, adjustment of the mA and/or kV according to patient size and/or use of iterative reconstruction technique. CONTRAST:  75mL OMNIPAQUE IOHEXOL 350 MG/ML SOLN COMPARISON:  CT abdomen and pelvis  11/30/2021. FINDINGS: CT CHEST FINDINGS Cardiovascular: No significant vascular findings. Normal heart size. No pericardial effusion. There are atherosclerotic calcifications of the aorta. Mediastinum/Nodes: The thyroid gland is heterogeneous and enlarged containing numerous nodules. The largest nodular densities in the isthmus measuring up to 2.8 cm. There are no enlarged mediastinal, hilar or axillary lymph nodes. Visualized esophagus is within normal limits. Lungs/Pleura: There is atelectasis in the bilateral lower lobes, right greater than left. Patchy ground-glass opacities in the left upper lobe are nonspecific. There is no pleural effusion or pneumothorax. There some secretions in the trachea. Musculoskeletal: No acute osseous findings. No suspicious osseous lesion identified. T11 vertebral body hemangioma is present. CT ABDOMEN PELVIS FINDINGS Hepatobiliary: No focal liver abnormality is seen. No gallstones, gallbladder wall thickening, or biliary dilatation. Pancreas: Low-attenuation lesions are seen in the body and tail of the pancreas measuring up to 1 cm. These have mildly increased in size in the tail of the pancreas. There is no pancreatic ductal dilatation or surrounding inflammation. Spleen: Normal in size without focal abnormality. Adrenals/Urinary Tract: Adrenal glands are unremarkable. There is no hydronephrosis. There is a left renal cyst measuring 15 mm. Bladder is unremarkable. Stomach/Bowel: There circumferential distal rectal wall thickening. The rectum is dilated and stool-filled with mild surrounding inflammation. No dilated bowel loops are seen. The appendix is within normal limits. Small bowel and stomach are within normal limits. Vascular/Lymphatic: Aortic atherosclerosis. No enlarged abdominal or pelvic lymph nodes. Reproductive: Status post hysterectomy. No adnexal masses. Other: There is presacral edema. There is no ascites or focal abdominal wall hernia. There is mild body wall  edema. Musculoskeletal: No acute fracture or focal osseous lesion identified. The bones are diffusely osteopenic. IMPRESSION: 1. Circumferential distal rectal wall thickening with surrounding inflammation and presacral edema. Findings are suspicious for rectal malignancy. 2. There is rectal fecal impaction and findings suspicious for stercoral colitis. 3. No evidence for metastatic disease in the chest, abdomen or pelvis. 4. Patchy ground-glass opacities in the left upper lobe are nonspecific and may be infectious/inflammatory. 5. Low-attenuation lesions in the body and tail of the pancreas measuring up to 1 cm. These have mildly increased in size in the tail of the pancreas. Findings may represent side branch IPMNs. Recommend follow-up MRI in 1 year. 6. Multinodular goiter.  Recommend nonemergent thyroid ultrasound. 7. Mild body wall edema. 8. Left Bosniak I benign renal cyst measuring 1.5 cm. No follow-up imaging is recommended. JACR 2018 Feb; 264-273, Management of the Incidental Renal Mass on CT, [employer] 2021; [phone], Bosniak Classification of Cystic Renal Masses, Version 2019. Aortic Atherosclerosis (ICD10-I70.0). Electronically Signed   By: Darliss Cheney M.D.   On: 02/03/2023 02:17   DG Chest 1 View Result Date: 02/02/2023 CLINICAL DATA:  Sepsis EXAM: CHEST  1 VIEW COMPARISON:  12/26/2021 FINDINGS: Low lung volumes. No consolidation or effusion. Stable cardiomediastinal silhouette with aortic atherosclerosis. No pneumothorax. Interval permeative appearance of the shafts of the left greater than right humerus. IMPRESSION: 1. Low lung volumes. No active disease. 2. Interval permeative  appearance of the shafts of the left greater than right humerus, appearance is suspicious for metastatic disease or marrow disease such as myeloma. Electronically Signed   By: Jasmine Pang M.D.   On: 02/02/2023 20:03       Assessment / Plan:    #69 83 year old white female, nursing home patient with history of  prior CVA, hemiplegia and intermittently nonverbal for long periods of time.  Daughter states that she had not spoken in a month but is talking today  Admitted with anemia, heme positive stool hemoglobin 6.2 on admission  Imaging with CT chest abdomen and pelvis showed possible pneumonia left upper lobe And circumferential distal rectal wall thickening with surrounding inflammation, fecal impaction and presacral edema rule out stercoral colitis his rectal malignancy, also had several low attenuation lesions in the body and the tail of the pancreas probably sidebranch IPMN's  She has been transfused, hemoglobin 8.3 today No overt rectal bleeding  Alert and talking today, passed swallowing evaluation  Has not had any of the smog enemas ordered yesterday as yet, these will start this afternoon, also needs to be manually disimpacted  Long discussion with patient's daughter at bedside today who states that she does not want her mother to undergo anything aggressive measures, and no procedures   #2 abnormal imaging of the humerus bilaterally concerning for possible multiple myeloma, Multiple myeloma panel pending  #3 chronic anticoagulation-on Xarelto currently on hold #4 prior history of DVT #5 diabetes mellitus #6 hypertension #7 coagulopathy possibly secondary to Xarelto  Plan; liquids for now, advance to pured diet as she tolerates Continue twice daily MiraLAX-until impaction has resolved then once daily Smog enema now then again this evening and 1 in a.m., manual impaction as needed per nursing  As above long discussion with patient's daughter at bedside, she does not want to put her mother through any aggressive workup, and no procedures which is very reasonable for this patient.  Plans for flexible sigmoidoscopy or colonoscopy, would just be sure fecal impaction clears.  GI will be available if needed         Principal Problem:   GI bleed Active Problems:   Essential  hypertension   Hyperlipidemia with target LDL less than 70   Insulin dependent type 2 diabetes mellitus (HCC)   Stroke (cerebrum) (HCC)   Protein-calorie malnutrition, severe   Anemia     LOS: 2 days   Amy EsterwoodPA-C  02/04/2023, 2:41 PM     Attending physician's note   I have reviewed the chart and examined the patient. I performed a substantive portion of this encounter, including complete performance of at least one of the key components, in conjunction with the APP. I agree with the Advanced Practitioner's note, impression and recommendations.   83yr old nonverbal pt with prior CVA, dementia, DM2, HTN, prior DVT on Xeralto adm with Hb 6.2 (labs done at [facility], down from 12 in April 2024) with H+ stools. Being evaluated for multiple myeloma vs metastatic disease (on CxR)   CT AP done today shows large fecal impaction (rectum 7 cm), possible stercoral colitis vs rectal mass. Now with some diarrhea but X ray KUB still shows large rectal stool. No obstruction  Plan: -Continue SMOG enemas,  -Continue MiraLAX -Rpt X- ray KUB in 2-3 days -Amy had long discussion with patient's daughter.  She would not like to put her mom through any endo procedures. I agree.  Will sign off.  Please call us if we could be of  any assistance.     Edman Circle, MD [facility] [phone]

## 2023-02-05 DIAGNOSIS — E43 Unspecified severe protein-calorie malnutrition: Secondary | ICD-10-CM | POA: Diagnosis not present

## 2023-02-05 DIAGNOSIS — R52 Pain, unspecified: Secondary | ICD-10-CM | POA: Diagnosis not present

## 2023-02-05 DIAGNOSIS — Z515 Encounter for palliative care: Secondary | ICD-10-CM | POA: Diagnosis not present

## 2023-02-05 DIAGNOSIS — Z66 Do not resuscitate: Secondary | ICD-10-CM

## 2023-02-05 DIAGNOSIS — K922 Gastrointestinal hemorrhage, unspecified: Secondary | ICD-10-CM | POA: Diagnosis not present

## 2023-02-05 LAB — CBC WITH DIFFERENTIAL/PLATELET
Abs Immature Granulocytes: 0.05 10*3/uL (ref 0.00–0.07)
Basophils Absolute: 0 10*3/uL (ref 0.0–0.1)
Basophils Relative: 0 %
Eosinophils Absolute: 0 10*3/uL (ref 0.0–0.5)
Eosinophils Relative: 0 %
HCT: 22.6 % — ABNORMAL LOW (ref 36.0–46.0)
Hemoglobin: 7.2 g/dL — ABNORMAL LOW (ref 12.0–15.0)
Immature Granulocytes: 1 %
Lymphocytes Relative: 16 %
Lymphs Abs: 1.3 10*3/uL (ref 0.7–4.0)
MCH: 25.3 pg — ABNORMAL LOW (ref 26.0–34.0)
MCHC: 31.9 g/dL (ref 30.0–36.0)
MCV: 79.3 fL — ABNORMAL LOW (ref 80.0–100.0)
Monocytes Absolute: 0.9 10*3/uL (ref 0.1–1.0)
Monocytes Relative: 11 %
Neutro Abs: 6.2 10*3/uL (ref 1.7–7.7)
Neutrophils Relative %: 72 %
Platelets: 347 10*3/uL (ref 150–400)
RBC: 2.85 MIL/uL — ABNORMAL LOW (ref 3.87–5.11)
RDW: 16.3 % — ABNORMAL HIGH (ref 11.5–15.5)
WBC: 8.5 10*3/uL (ref 4.0–10.5)
nRBC: 0 % (ref 0.0–0.2)

## 2023-02-05 LAB — AFP TUMOR MARKER: AFP, Serum, Tumor Marker: 2.6 ng/mL (ref 0.0–8.7)

## 2023-02-05 LAB — BASIC METABOLIC PANEL
Anion gap: 7 (ref 5–15)
BUN: 14 mg/dL (ref 8–23)
CO2: 24 mmol/L (ref 22–32)
Calcium: 8.9 mg/dL (ref 8.9–10.3)
Chloride: 108 mmol/L (ref 98–111)
Creatinine, Ser: 0.99 mg/dL (ref 0.44–1.00)
GFR, Estimated: 57 mL/min — ABNORMAL LOW (ref >60)
Glucose, Bld: 239 mg/dL — ABNORMAL HIGH (ref 70–99)
Potassium: 3.2 mmol/L — ABNORMAL LOW (ref 3.5–5.1)
Sodium: 139 mmol/L (ref 135–145)

## 2023-02-05 LAB — CULTURE, BLOOD (ROUTINE X 2): Special Requests: ADEQUATE

## 2023-02-05 LAB — CANCER ANTIGEN 19-9: CA 19-9: 30 U/mL (ref 0–35)

## 2023-02-05 LAB — MAGNESIUM: Magnesium: 1.9 mg/dL (ref 1.7–2.4)

## 2023-02-05 LAB — CEA: CEA: 5.7 ng/mL — ABNORMAL HIGH (ref 0.0–4.7)

## 2023-02-05 MED ORDER — MORPHINE SULFATE (CONCENTRATE) 10 MG /0.5 ML PO SOLN: 5.0000 mg | ORAL | Status: DC | PRN

## 2023-02-05 MED ORDER — POTASSIUM CHLORIDE CRYS ER 20 MEQ PO TBCR
40.0000 meq | EXTENDED_RELEASE_TABLET | ORAL | Status: DC
  Administered 2023-02-05: 40 meq via ORAL
  Filled 2023-02-05: qty 2

## 2023-02-05 MED ORDER — MORPHINE SULFATE (CONCENTRATE) 10 MG /0.5 ML PO SOLN
5.0000 mg | Freq: Four times a day (QID) | ORAL | Status: DC
  Administered 2023-02-05 – 2023-02-07 (×9): 5 mg via ORAL
  Filled 2023-02-05 (×9): qty 0.5

## 2023-02-05 NOTE — Progress Notes (Signed)
PROGRESS NOTE    Wanda [name]  ION:[id number] DOB: 1941/01/05 DOA: 02/02/2023 PCP: Pearline Cables, MD   Brief Narrative:  83 year old female with history of hypertension, hyperlipidemia, diabetes mellitus type 2, severe protein calorie malnutrition, anxiety and depression, DVT on Xarelto, unspecified prior CVA with hemiplegia and nonverbal state was sent from her facility because of anemia and apparently blood was noted in her stool.  On presentation, hemoglobin was 6.2 with positive fecal occult blood and INR of 2.47.  Chest x-ray showed questionable metastatic cancer versus myeloma.  She was started on IV Protonix.  Packed red cell transfusion was ordered.  GI consulted.  Palliative care also has been consulted for goals of care discussion.  Assessment & Plan:   Probable GI bleed presenting with acute blood loss anemia -Presented with hemoglobin of 6.2 with fecal occult blood testing positive.  Unclear if this is all from subacute GI blood loss versus underlying hematologic disorder/myeloma -Status post 1 unit packed red cell transfusion.  Hemoglobin pending today, was 8.3 on 02/04/2023.  Monitor H&H.  Continue Protonix IV.  GI signed off on 02/04/2023: Recommending palliative care/hospice care.  Patient/family not interested in any endoscopic procedures -Xarelto on hold  Left upper lobe pneumonia -CT chest showing patchy groundglass opacities in the left upper lobe.  Currently on room air.  Continue IV antibiotics.  Aspiration precautions.  Diet as per SLP recommendations  Fecal impaction and concern for rectal malignancy versus stercoral colitis -As seen on imaging.  No evidence of metastatic disease in the chest abdomen or pelvis. -Laxatives as per GI  Coagulopathy?  Secondary to Xarelto use -INR 2.4 on presentation.  Status post 2 units FFP's transfusion  Multinodular goiter -Seen on CT of chest.  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  Recommended outpatient MRI of abdomen imaging here.    Essential hypertension Hyperlipidemia -Monitor blood pressure.  Use IV antihypertensives if needed.  Zetia on hold  Anxiety and depression -Effexor on hold  Diabetes mellitus type 2 with hyperglycemia and hypoglycemia -A1c 7.4.  Continue CBGs with SSI  History of DVT -Xarelto on hold  Severe protein calorie malnutrition -Consult nutrition   History of unspecified stroke with residual hemiplegia and nonverbal state Goals of care -Fall precautions.  Palliative care following: Family leaning towards comfort approach.  Palliative team to meet with family today.  DVT prophylaxis: SCDs Code Status: DNR as per documentation sent from facility Family Communication: None at bedside Disposition Plan: Status is: Inpatient Remains inpatient appropriate because: Of severity of illness  Consultants: GI/palliative care  Procedures: None  Antimicrobials: Zosyn from 02/02/2023 onwards   Subjective: Patient seen and examined at bedside.  No fever, vomiting, agitation or seizures reported.   Objective: Vitals:   02/04/23 0754 02/04/23 1602 02/04/23 2056 02/05/23 0645  BP: 119/68 (!) 144/44 (!) 100/33 (!) 113/50  Pulse: 86 92 89 96  Resp: 17 17 19 16   Temp: 98 F (36.7 C) 98.1 F (36.7 C)    TempSrc: Oral     SpO2: 100% 100% 99% 98%  Weight:      Height:        Intake/Output Summary (Last 24 hours) at 02/05/2023 0657 Last data filed at 02/05/2023 0500 Gross per 24 hour  Intake 2230 ml  Output 10 ml  Net 2220 ml   Filed Weights   02/03/23 0200 02/03/23 1427 02/04/23 0407  Weight: 65.8 kg 65.7 kg 44.9  kg    Examination:  General: No acute distress.  Remains on room air.  Chronically ill and deconditioned looking ENT/neck: No palpable neck masses or elevated JVD noted  respiratory: Bilateral decreased breath sounds at bases with scattered crackles  CVS: Rate mostly controlled; S1  and S2 are heard  abdominal: Soft, nontender, distended mildly; no organomegaly, normal bowel sounds are heard Extremities: No clubbing; mild lower extremity edema present CNS: Extremely slow to respond, poor historian, wakes up only very slightly. Lymph: No lymphadenopathy palpable  skin: No obvious rashes/petechiae  psych: Flat affect.  Not agitated currently. Musculoskeletal: No obvious joint erythema/tenderness  Data Reviewed: I have personally reviewed following labs and imaging studies  CBC: Recent Labs  Lab 02/02/23 1940 02/03/23 0109 02/03/23 0939 02/04/23 0722  WBC 9.6  --   --  12.5*  NEUTROABS 6.6  --   --  10.7*  HGB 6.2* 8.2* 7.6* 8.3*  HCT 21.2* 26.1* 23.8* 25.5*  MCV 79.7*  --   --  78.5*  PLT 401*  --   --  376   Basic Metabolic Panel: Recent Labs  Lab 02/02/23 1940 02/04/23 0722  NA 142 143  K 4.1 3.8  CL 109 112*  CO2 24 22  GLUCOSE 115* 128*  BUN 25* 19  CREATININE 0.97 1.05*  CALCIUM 9.7 9.4  MG  --  2.7*   GFR: Estimated Creatinine Clearance: 28.8 mL/min (A) (by C-G formula based on SCr of 1.05 mg/dL (H)). Liver Function Tests: Recent Labs  Lab 02/02/23 1940 02/04/23 0722  AST 18 26  ALT 15 16  ALKPHOS 57 55  BILITOT 0.4 0.7  PROT 6.6 6.2*  ALBUMIN 3.0* 2.8*   No results for input(s): "LIPASE", "AMYLASE" in the last 168 hours. Recent Labs  Lab 02/03/23 0030  AMMONIA 27   Coagulation Profile: Recent Labs  Lab 02/02/23 1940  INR 2.4*   Cardiac Enzymes: No results for input(s): "CKTOTAL", "CKMB", "CKMBINDEX", "TROPONINI" in the last 168 hours. BNP (last 3 results) No results for input(s): "PROBNP" in the last 8760 hours. HbA1C: Recent Labs    02/02/23 2140  HGBA1C 7.4*   CBG: Recent Labs  Lab 02/03/23 2043 02/04/23 0008 02/04/23 0409 02/04/23 0751 02/04/23 1215  GLUCAP 72 165* 149* 116* 132*   Lipid Profile: No results for input(s): "CHOL", "HDL", "LDLCALC", "TRIG", "CHOLHDL", "LDLDIRECT" in the last 72  hours. Thyroid Function Tests: No results for input(s): "TSH", "T4TOTAL", "FREET4", "T3FREE", "THYROIDAB" in the last 72 hours. Anemia Panel: Recent Labs    02/02/23 2140  VITAMINB12 850  FOLATE 28.2  FERRITIN 16  TIBC 367  IRON 15*  RETICCTPCT 1.4   Sepsis Labs: No results for input(s): "PROCALCITON", "LATICACIDVEN" in the last 168 hours.  Recent Results (from the past 240 hours)  Culture, blood (Routine X 2) w Reflex to ID Panel     Status: Abnormal (Preliminary result)   Collection Time: 02/03/23  3:21 AM   Specimen: BLOOD  Result Value Ref Range Status   Specimen Description BLOOD BLOOD LEFT HAND  Final   Special Requests   Final    BOTTLES DRAWN AEROBIC AND ANAEROBIC Blood Culture adequate volume   Culture  Setup Time   Final    GRAM POSITIVE COCCI IN CLUSTERS AEROBIC BOTTLE ONLY CRITICAL RESULT CALLED TO, READ BACK BY AND VERIFIED WITH: PHARMD J LEDFORD 02/04/2023 @ 0114 BY AB    Culture (A)  Final    STAPHYLOCOCCUS HOMINIS THE SIGNIFICANCE OF ISOLATING THIS ORGANISM  FROM A SINGLE SET OF BLOOD CULTURES WHEN MULTIPLE SETS ARE DRAWN IS UNCERTAIN. PLEASE NOTIFY THE MICROBIOLOGY DEPARTMENT WITHIN ONE WEEK IF SPECIATION AND SENSITIVITIES ARE REQUIRED. Performed at [facility], [address], [place], [place] [address]    Report Status PENDING  Incomplete  Culture, blood (Routine X 2) w Reflex to ID Panel     Status: None (Preliminary result)   Collection Time: 02/03/23  3:21 AM   Specimen: BLOOD  Result Value Ref Range Status   Specimen Description BLOOD BLOOD LEFT ARM  Final   Special Requests   Final    BOTTLES DRAWN AEROBIC AND ANAEROBIC Blood Culture adequate volume   Culture  Setup Time   Final    GRAM POSITIVE COCCI IN CLUSTERS AEROBIC BOTTLE ONLY CRITICAL VALUE NOTED.  VALUE IS CONSISTENT WITH PREVIOUSLY REPORTED AND CALLED VALUE.    Culture   Final    GRAM POSITIVE COCCI CULTURE REINCUBATED FOR BETTER GROWTH Performed at [facility],  [address] [place], [place] [address]    Report Status PENDING  Incomplete  Blood Culture ID Panel (Reflexed)     Status: Abnormal   Collection Time: 02/03/23  3:21 AM  Result Value Ref Range Status   Enterococcus faecalis NOT DETECTED NOT DETECTED Final   Enterococcus Faecium NOT DETECTED NOT DETECTED Final   Listeria monocytogenes NOT DETECTED NOT DETECTED Final   Staphylococcus species DETECTED (A) NOT DETECTED Final    Comment: CRITICAL RESULT CALLED TO, READ BACK BY AND VERIFIED WITH: PHARMD J LEDFORD 02/04/2023 @ 0114 BY AB    Staphylococcus aureus (BCID) NOT DETECTED NOT DETECTED Final   Staphylococcus epidermidis NOT DETECTED NOT DETECTED Final   Staphylococcus lugdunensis NOT DETECTED NOT DETECTED Final   Streptococcus species NOT DETECTED NOT DETECTED Final   Streptococcus agalactiae NOT DETECTED NOT DETECTED Final   Streptococcus pneumoniae NOT DETECTED NOT DETECTED Final   Streptococcus pyogenes NOT DETECTED NOT DETECTED Final   A.calcoaceticus-baumannii NOT DETECTED NOT DETECTED Final   Bacteroides fragilis NOT DETECTED NOT DETECTED Final   Enterobacterales NOT DETECTED NOT DETECTED Final   Enterobacter cloacae complex NOT DETECTED NOT DETECTED Final   Escherichia coli NOT DETECTED NOT DETECTED Final   Klebsiella aerogenes NOT DETECTED NOT DETECTED Final   Klebsiella oxytoca NOT DETECTED NOT DETECTED Final   Klebsiella pneumoniae NOT DETECTED NOT DETECTED Final   Proteus species NOT DETECTED NOT DETECTED Final   Salmonella species NOT DETECTED NOT DETECTED Final   Serratia marcescens NOT DETECTED NOT DETECTED Final   Haemophilus influenzae NOT DETECTED NOT DETECTED Final   Neisseria meningitidis NOT DETECTED NOT DETECTED Final   Pseudomonas aeruginosa NOT DETECTED NOT DETECTED Final   Stenotrophomonas maltophilia NOT DETECTED NOT DETECTED Final   Candida albicans NOT DETECTED NOT DETECTED Final   Candida auris NOT DETECTED NOT DETECTED Final   Candida glabrata NOT  DETECTED NOT DETECTED Final   Candida krusei NOT DETECTED NOT DETECTED Final   Candida parapsilosis NOT DETECTED NOT DETECTED Final   Candida tropicalis NOT DETECTED NOT DETECTED Final   Cryptococcus neoformans/gattii NOT DETECTED NOT DETECTED Final    Comment: Performed at [facility], [address], [place], [place] [address]         Radiology Studies: DG Abd 2 Views Result Date: 02/04/2023 CLINICAL DATA:  Possible fecal impaction EXAM: ABDOMEN - 2 VIEW COMPARISON:  12/29/2021 FINDINGS: Scattered large and small bowel gas is noted. No obstructive changes are seen. Mild retained fecal  material is noted within the rectum which may represent some early impaction. Bladder is well distended with contrast opacified urine. No free air is seen. No bony abnormality is noted. IMPRESSION: Retained fecal material within the rectum which may represent an early impaction. Electronically Signed   By: Alcide Clever M.D.   On: 02/04/2023 00:59        Scheduled Meds:  feeding supplement  1 Container Oral TID BM   milk and molasses  1 enema Rectal Once   pantoprazole (PROTONIX) IV  40 mg Intravenous Q12H   polyethylene glycol  17 g Oral BID   SMOG  400 mL Rectal Q1400   Continuous Infusions:  sodium chloride 10 mL/hr at 02/04/23 1653          Ritha Sampedro, MD [facility] 02/05/2023, 6:57 AM

## 2023-02-05 NOTE — Progress Notes (Addendum)
This chaplain responded to PMT NP-Mary consult for spiritual care. This chaplain understands the Pt. is transitioning to comfort care and the family is requesting prayer.   The Pt. daughter-Wanda [name] is at the bedside during the visit. Wanda [name] tearfully accepted the chaplain's invitation for presence and reflective listening. The chaplain affirmed Wanda [name]'s self reflection and curiosity as Wanda [name] identified with the mystery in the Pt. story and faith journey.  Wanda [name] speaks positively of the support she receives from her husband as she holds the role of caregiver for the Pt.   The chaplain learned the Pt. is Catholic and a priest presence will provide comfort for the Pt. The chaplain left a VM for Father Wanda [name] to visit. The chaplain will update the RN as plans are made. The chaplain understands it is acceptable to share Wanda [name]'s phone number with Father Wanda [name] for coordination of a visit.  Wanda [name] accepted the the chaplain invitation for prayer and F/U spiritual care.  **1530 This chaplain understands Father Wanda [name] will visit today.   Chaplain Stephanie Acre [phone]

## 2023-02-05 NOTE — Plan of Care (Signed)
  Problem: Skin Integrity: Goal: Risk for impaired skin integrity will decrease Outcome: Progressing   Problem: Tissue Perfusion: Goal: Adequacy of tissue perfusion will improve Outcome: Progressing   Problem: Activity: Goal: Risk for activity intolerance will decrease Outcome: Progressing   Problem: Safety: Goal: Ability to remain free from injury will improve Outcome: Progressing

## 2023-02-05 NOTE — Progress Notes (Signed)
OT Cancellation Note  Patient Details Name: Wanda [name] MRN: [id number] DOB: 02-02-1940   Cancelled Treatment:    Reason Eval/Treat Not Completed: Other (comment) (Per chart review pt with PMT meeting at 10am today for possible transition to comfort care, reached out to PMT to confirm but have not heard back/note not yet posted. Will defer OT evaluation until GOC have been established/ as appropriate.)  Carver Fila, OTD, OTR/L [facility]Chat [facility] [phone]   Dalphine Handing 02/05/2023, 3:26 PM

## 2023-02-05 NOTE — Progress Notes (Addendum)
PT Cancellation Note  Patient Details Name: Wanda [name] MRN: [id number] DOB: April 06, 1940   Cancelled Treatment:    Reason Eval/Treat Not Completed: Other (comment) (Per incomplete note PMT and complete note from chaplin service, pt's GOC moving towards comfort based measures. Reached out to PMT to confirm and awaiting response. Will defer PT eval until GOC confirmed and if pt appropriate.)  Addendum @ 17:35 pt now with full comfort measures ordered. PT will sign off at this time.  Wynn Maudlin, DPT Acute Rehabilitation Services Office [phone]  02/05/23 4:04 PM

## 2023-02-05 NOTE — Progress Notes (Signed)
Patient ID: Wanda [name], female   DOB: Mar 25, 1940, 83 y.o.   MRN: [id number]    Progress Note from the Palliative Medicine Team at [facility]   Patient Name: Wanda [name]        Date: 02/05/2023 DOB: 1940-09-23  Age: 83 y.o. MRN#: [id number] Attending Physician: Glade Lloyd, MD Primary Care Physician: Pearline Cables, MD Admit Date: 02/02/2023   Reason for Consultation/Follow-up   Establishing Goals of Care   HPI/ Brief Hospital Review  83 y.o. female   admitted on 02/02/2023 with past medical history of  HLD, T2DM, severe protein calorie deficient malnutrition, anxiety and depression, history of DVT.  She is a nursing home resident.  She was sent in due to anemia.  Blood noted in patient's stool.  CBC done revealed marked anemia.   In the ER patient's vital stable.  Hemoglobin was 6.2.  Patient fecal occult blood positive with dark stool.  INR 2.47.  Chest x-ray questions metastatic cancer versus multiple myeloma.  Patient quite somnolent difficult to arouse.    Patient is a resident of [facility] for the past year and a half.  Patient is without medical decision making capacity.   Family face treatment option decisions, advanced directive decisions and anticipatory care needs.    Subjective  Extensive chart review has been completed prior to meeting with patient/family  including labs, vital signs, imaging, progress/consult notes, orders, medications and available advance directive documents.    This NP assessed patient at the bedside as a follow up for palliative medicine needs and emotional support and to meet with daughter Leatha Gilding for scheduled eating for further clarification of goals of care.  Continued education regarding patient's current medical situation.  Ongoing education regarding patient's continued physical, functional and cognitive decline.  We spoke to concept of adult failure to thrive and the limitations of medical interventions to  prolong quality of life when the body fails to thrive  Education offered on the difference between a full medical support path versus a palliative comfort path allowing for natural death.  Patient's daughter understands the situation and believes comfort and dignity are the priority for her mother at this time in her life  Plan of care      Focus of care is comfort and dignity allowing for natural death -DNR/DNI -No artificial feeding or hydration now or in the future -no further diagnostics, workup, scans, labs -minimize medication to only those enhancing comfort -Symptom management to enhance comfort  -- Morphine/Roxanol 5 mg p.o./sublingual every 6 hours scheduled for underlying generalized pain  -- Morphine/Roxanol 5 mg p.o./sublingual every 3 hours as needed for breakthrough pain -Evaluate in 24 to 48 hours for disposition   MOST form completed to reflect full comfort   Hard Choices booklet for review  Occasion offered on hospice benefit; philosophy and eligibility.  Education offered on both hospice at SNF versus inpatient hospice unit  Education offered on the natural trajectory and expectations at end-of-life   Education offered today regarding  the importance of continued conversation with family and their  medical providers regarding overall plan of care and treatment options,  ensuring decisions are within the context of the patients values and GOCs.  Questions and concerns addressed   Discussed with primary team and nursing staff  PMT will follow-up in the morning   Time:  65   minutes     Discussed with treatment team via secure chat  Detailed review of medical records ( labs,  imaging, vital signs), medically appropriate exam ( MS, skin, cardiac,  resp)   discussed with treatment team, counseling and education to patient, family, staff, documenting clinical information, medication management, coordination of care    Lorinda Creed NP  Palliative Medicine Team Team Phone  # [phone] Pager [phone]

## 2023-02-06 DIAGNOSIS — R52 Pain, unspecified: Secondary | ICD-10-CM | POA: Diagnosis not present

## 2023-02-06 DIAGNOSIS — E43 Unspecified severe protein-calorie malnutrition: Secondary | ICD-10-CM | POA: Diagnosis not present

## 2023-02-06 DIAGNOSIS — Z515 Encounter for palliative care: Secondary | ICD-10-CM | POA: Diagnosis not present

## 2023-02-06 DIAGNOSIS — K922 Gastrointestinal hemorrhage, unspecified: Secondary | ICD-10-CM | POA: Diagnosis not present

## 2023-02-06 DIAGNOSIS — I63231 Cerebral infarction due to unspecified occlusion or stenosis of right carotid arteries: Secondary | ICD-10-CM | POA: Diagnosis not present

## 2023-02-06 NOTE — Care Management Important Message (Signed)
Important Message  Patient Details  Name: Wanda [name] MRN: [id number] Date of Birth: 15-May-1940   Important Message Given:  Yes - Medicare IM     Dorena Bodo 02/06/2023, 3:37 PM

## 2023-02-06 NOTE — Progress Notes (Addendum)
Transition of Care [facility]) - Inpatient Brief Assessment   Patient Details  Name: Shayonna Ocampo MRN: [id number] Date of Birth: 11/26/1940  [facility] CM/SW Contact:    Janae Bridgeman, RN Phone Number: 02/06/2023, 2:06 PM   Clinical Narrative: CM spoke with Markham Jordan, NP with Palliative Care and she requested that I call and speak with the patient's daughter by phone and provide Medicare choice regarding inpatient hospice referral.  I called and left a secure voicemail with the patient's daughter, Leatha Gilding by phone and I'm waiting to hear back from her and will follow up to place the referral.  02/06/23 1500 - I called and spoke with the patient's daughter by phone and Medicare choice was offered regarding Inpatient Hospice placement and the patient's daughter preferred [facility] and [facility].  I called and placed a referral with Henderson Newcomer, CM with [facility] to review for consideration.   Transition of Care Asessment: Insurance and Status: (P) Insurance coverage has been reviewed Patient has primary care physician: (P) Yes Home environment has been reviewed: (P) From [facility] Prior level of function:: (P) Nursing facility care   Social Drivers of Health Review: (P) SDOH reviewed needs interventions Readmission risk has been reviewed: (P) Yes Transition of care needs: (P) transition of care needs identified, TOC will continue to follow

## 2023-02-06 NOTE — Progress Notes (Signed)
PROGRESS NOTE    Wanda [name]  ZOX:[id number] DOB: 1940/04/30 DOA: 02/02/2023 PCP: Pearline Cables, MD   Brief Narrative:  83 year old female with history of hypertension, hyperlipidemia, diabetes mellitus type 2, severe protein calorie malnutrition, anxiety and depression, DVT on Xarelto, unspecified prior CVA with hemiplegia and nonverbal state was sent from her facility because of anemia and apparently blood was noted in her stool.  On presentation, hemoglobin was 6.2 with positive fecal occult blood and INR of 2.47.  Chest x-ray showed questionable metastatic cancer versus myeloma.  She was started on IV Protonix.  Packed red cell transfusion was ordered.  GI consulted.  Palliative care also has been consulted for goals of care discussion.  Patient/family not interested in any endoscopic procedures.  GI signed off on 02/04/2023.  Family decided to pursue comfort measures on 02/05/2023.  Assessment & Plan:   Comfort measures only status  probable GI bleed presenting with acute blood loss anemia Left upper lobe pneumonia Fecal impaction and concern for rectal malignancy versus stercoral colitis Coagulopathy?  Secondary to Xarelto use Multinodular goiter Lesions in the pancreatic body and tail Essential hypertension Hyperlipidemia Anxiety and depression Diabetes mellitus type 2 with hyperglycemia and hypoglycemia History of DVT Severe protein calorie malnutrition History of unspecified stroke with residual hemiplegia and nonverbal state  DVT prophylaxis: None for comfort measures  code Status: DNR  Family Communication: None at bedside Disposition Plan: Status is: Inpatient Remains inpatient appropriate because: Of severity of illness  Consultants: GI/palliative care  Procedures: None  Antimicrobials:  Anti-infectives (From admission, onward)    Start     Dose/Rate Route Frequency Ordered Stop   02/03/23 1400  piperacillin-tazobactam (ZOSYN) IVPB 3.375 g  Status:   Discontinued        3.375 g 12.5 mL/hr over 240 Minutes Intravenous Every 8 hours 02/03/23 0813 02/04/23 1617   02/03/23 0700  piperacillin-tazobactam (ZOSYN) IVPB 3.375 g  Status:  Discontinued        3.375 g 12.5 mL/hr over 240 Minutes Intravenous Every 8 hours 02/03/23 0241 02/03/23 0813   02/03/23 0245  piperacillin-tazobactam (ZOSYN) IVPB 3.375 g        3.375 g 100 mL/hr over 30 Minutes Intravenous  Once 02/03/23 0239 02/03/23 0842        Subjective: Patient seen and examined at bedside.  No agitation, seizures reported.  Patient is confused.   Objective: Vitals:   02/05/23 0645 02/05/23 0749 02/05/23 1602 02/05/23 1953  BP: (!) 113/50 (!) 121/53 128/75 118/64  Pulse: 96 95 (!) 103 (!) 109  Resp: 16 17 17 18   Temp:   97.9 F (36.6 C) 99 F (37.2 C)  TempSrc:      SpO2: 98% 97% 97% 96%  Weight:      Height:        Intake/Output Summary (Last 24 hours) at 02/06/2023 0645 Last data filed at 02/05/2023 2000 Gross per 24 hour  Intake 430 ml  Output 1 ml  Net 429 ml   Filed Weights   02/03/23 0200 02/03/23 1427 02/04/23 0407  Weight: 65.8 kg 65.7 kg 44.9 kg    Examination:  General: Confused.  No distress.  Chronically ill and deconditioned looking  Data Reviewed: I have personally reviewed following labs and imaging studies  CBC: Recent Labs  Lab 02/02/23 1940 02/03/23 0109 02/03/23 0939 02/04/23 0722 02/05/23 0828  WBC 9.6  --   --  12.5* 8.5  NEUTROABS 6.6  --   --  10.7* 6.2  HGB 6.2* 8.2* 7.6* 8.3* 7.2*  HCT 21.2* 26.1* 23.8* 25.5* 22.6*  MCV 79.7*  --   --  78.5* 79.3*  PLT 401*  --   --  376 347   Basic Metabolic Panel: Recent Labs  Lab 02/02/23 1940 02/04/23 0722 02/05/23 0828  NA 142 143 139  K 4.1 3.8 3.2*  CL 109 112* 108  CO2 24 22 24   GLUCOSE 115* 128* 239*  BUN 25* 19 14  CREATININE 0.97 1.05* 0.99  CALCIUM 9.7 9.4 8.9  MG  --  2.7* 1.9   GFR: Estimated Creatinine Clearance: 30.5 mL/min (by C-G formula based on SCr of 0.99  mg/dL). Liver Function Tests: Recent Labs  Lab 02/02/23 1940 02/04/23 0722  AST 18 26  ALT 15 16  ALKPHOS 57 55  BILITOT 0.4 0.7  PROT 6.6 6.2*  ALBUMIN 3.0* 2.8*   No results for input(s): "LIPASE", "AMYLASE" in the last 168 hours. Recent Labs  Lab 02/03/23 0030  AMMONIA 27   Coagulation Profile: Recent Labs  Lab 02/02/23 1940  INR 2.4*   Cardiac Enzymes: No results for input(s): "CKTOTAL", "CKMB", "CKMBINDEX", "TROPONINI" in the last 168 hours. BNP (last 3 results) No results for input(s): "PROBNP" in the last 8760 hours. HbA1C: No results for input(s): "HGBA1C" in the last 72 hours.  CBG: Recent Labs  Lab 02/03/23 2043 02/04/23 0008 02/04/23 0409 02/04/23 0751 02/04/23 1215  GLUCAP 72 165* 149* 116* 132*   Lipid Profile: No results for input(s): "CHOL", "HDL", "LDLCALC", "TRIG", "CHOLHDL", "LDLDIRECT" in the last 72 hours. Thyroid Function Tests: No results for input(s): "TSH", "T4TOTAL", "FREET4", "T3FREE", "THYROIDAB" in the last 72 hours. Anemia Panel: No results for input(s): "VITAMINB12", "FOLATE", "FERRITIN", "TIBC", "IRON", "RETICCTPCT" in the last 72 hours.  Sepsis Labs: No results for input(s): "PROCALCITON", "LATICACIDVEN" in the last 168 hours.  Recent Results (from the past 240 hours)  Culture, blood (Routine X 2) w Reflex to ID Panel     Status: Abnormal   Collection Time: 02/03/23  3:21 AM   Specimen: BLOOD  Result Value Ref Range Status   Specimen Description BLOOD BLOOD LEFT HAND  Final   Special Requests   Final    BOTTLES DRAWN AEROBIC AND ANAEROBIC Blood Culture adequate volume   Culture  Setup Time   Final    GRAM POSITIVE COCCI IN CLUSTERS AEROBIC BOTTLE ONLY CRITICAL RESULT CALLED TO, READ BACK BY AND VERIFIED WITH: PHARMD J LEDFORD 02/04/2023 @ 0114 BY AB    Culture (A)  Final    STAPHYLOCOCCUS HOMINIS THE SIGNIFICANCE OF ISOLATING THIS ORGANISM FROM A SINGLE SET OF BLOOD CULTURES WHEN MULTIPLE SETS ARE DRAWN IS UNCERTAIN.  PLEASE NOTIFY THE MICROBIOLOGY DEPARTMENT WITHIN ONE WEEK IF SPECIATION AND SENSITIVITIES ARE REQUIRED. Performed at [facility], [address], [place], [place] [address]    Report Status 02/05/2023 FINAL  Final  Culture, blood (Routine X 2) w Reflex to ID Panel     Status: Abnormal   Collection Time: 02/03/23  3:21 AM   Specimen: BLOOD  Result Value Ref Range Status   Specimen Description BLOOD BLOOD LEFT ARM  Final   Special Requests   Final    BOTTLES DRAWN AEROBIC AND ANAEROBIC Blood Culture adequate volume   Culture  Setup Time   Final    GRAM POSITIVE COCCI IN CLUSTERS AEROBIC BOTTLE ONLY CRITICAL VALUE NOTED.  VALUE IS CONSISTENT WITH PREVIOUSLY REPORTED AND CALLED VALUE.    Culture (A)  Final  STAPHYLOCOCCUS CAPITIS THE SIGNIFICANCE OF ISOLATING THIS ORGANISM FROM A SINGLE SET OF BLOOD CULTURES WHEN MULTIPLE SETS ARE DRAWN IS UNCERTAIN. PLEASE NOTIFY THE MICROBIOLOGY DEPARTMENT WITHIN ONE WEEK IF SPECIATION AND SENSITIVITIES ARE REQUIRED. Performed at [facility], [address], [place], [place] [address]    Report Status 02/05/2023 FINAL  Final  Blood Culture ID Panel (Reflexed)     Status: Abnormal   Collection Time: 02/03/23  3:21 AM  Result Value Ref Range Status   Enterococcus faecalis NOT DETECTED NOT DETECTED Final   Enterococcus Faecium NOT DETECTED NOT DETECTED Final   Listeria monocytogenes NOT DETECTED NOT DETECTED Final   Staphylococcus species DETECTED (A) NOT DETECTED Final    Comment: CRITICAL RESULT CALLED TO, READ BACK BY AND VERIFIED WITH: PHARMD J LEDFORD 02/04/2023 @ 0114 BY AB    Staphylococcus aureus (BCID) NOT DETECTED NOT DETECTED Final   Staphylococcus epidermidis NOT DETECTED NOT DETECTED Final   Staphylococcus lugdunensis NOT DETECTED NOT DETECTED Final   Streptococcus species NOT DETECTED NOT DETECTED Final   Streptococcus agalactiae NOT DETECTED NOT DETECTED Final   Streptococcus pneumoniae NOT DETECTED NOT DETECTED Final    Streptococcus pyogenes NOT DETECTED NOT DETECTED Final   A.calcoaceticus-baumannii NOT DETECTED NOT DETECTED Final   Bacteroides fragilis NOT DETECTED NOT DETECTED Final   Enterobacterales NOT DETECTED NOT DETECTED Final   Enterobacter cloacae complex NOT DETECTED NOT DETECTED Final   Escherichia coli NOT DETECTED NOT DETECTED Final   Klebsiella aerogenes NOT DETECTED NOT DETECTED Final   Klebsiella oxytoca NOT DETECTED NOT DETECTED Final   Klebsiella pneumoniae NOT DETECTED NOT DETECTED Final   Proteus species NOT DETECTED NOT DETECTED Final   Salmonella species NOT DETECTED NOT DETECTED Final   Serratia marcescens NOT DETECTED NOT DETECTED Final   Haemophilus influenzae NOT DETECTED NOT DETECTED Final   Neisseria meningitidis NOT DETECTED NOT DETECTED Final   Pseudomonas aeruginosa NOT DETECTED NOT DETECTED Final   Stenotrophomonas maltophilia NOT DETECTED NOT DETECTED Final   Candida albicans NOT DETECTED NOT DETECTED Final   Candida auris NOT DETECTED NOT DETECTED Final   Candida glabrata NOT DETECTED NOT DETECTED Final   Candida krusei NOT DETECTED NOT DETECTED Final   Candida parapsilosis NOT DETECTED NOT DETECTED Final   Candida tropicalis NOT DETECTED NOT DETECTED Final   Cryptococcus neoformans/gattii NOT DETECTED NOT DETECTED Final    Comment: Performed at [facility], [address], [place], [place] [address]         Radiology Studies: No results found.       Scheduled Meds:  feeding supplement  1 Container Oral TID BM   milk and molasses  1 enema Rectal Once   morphine CONCENTRATE  5 mg Oral Q6H   polyethylene glycol  17 g Oral BID   Continuous Infusions:          Glade Lloyd, MD [facility] 02/06/2023, 6:45 AM

## 2023-02-06 NOTE — Progress Notes (Signed)
Patient ID: Wanda [name], female   DOB: 1940-05-15, 83 y.o.   MRN: [id number]    Progress Note from the Palliative Medicine Team at [facility]   Patient Name: Wanda [name]        Date: 02/06/2023 DOB: 1940-02-14  Age: 83 y.o. MRN#: [id number] Attending Physician: Glade Lloyd, MD Primary Care Physician: Pearline Cables, MD Admit Date: 02/02/2023   Reason for Consultation/Follow-up   Establishing Goals of Care   HPI/ Brief Hospital Review  83 y.o. female   admitted on 02/02/2023 with past medical history of  HLD, T2DM, severe protein calorie deficient malnutrition, anxiety and depression, history of DVT.  She is a nursing home resident.  She was sent in due to anemia.  Blood noted in patient's stool.  CBC done revealed marked anemia.   In the ER patient's vital stable.  Hemoglobin was 6.2.  Patient fecal occult blood positive with dark stool.  INR 2.47.  Chest x-ray questions metastatic cancer versus multiple myeloma.  Patient quite somnolent difficult to arouse.    Patient is a resident of [facility] for the past year and a half.  Patient is without medical decision making capacity.   Family face treatment option decisions, advanced directive decisions and anticipatory care needs.    Subjective  Extensive chart review has been completed prior to meeting with patient/family  including labs, vital signs, imaging, progress/consult notes, orders, medications and available advance directive documents.    This NP assessed patient at the bedside as a follow up for palliative medicine needs and emotional support.  No p.o. intake over the last 24 hours per nursing.  Patient minimally responsive,  did not awaken to gentle touch and verbal stimuli.  Today decision was made by family to focus care solely on comfort and dignity allowing for natural death, foregoing all life-prolonging measures.  I spoke to [name] by telephone.  She is comfortable  with  decision regarding comfort measures.   Again education offered on the difference between a full medical support path versus a palliative comfort path allowing for natural death.  Education  offered on natural trajectory and expectations at end-of-life.   Plan of care      Focus of care is comfort and dignity allowing for natural death -DNR/DNI -No artificial feeding or hydration now or in the future -no further diagnostics, workup, scans, labs -minimize medication to only those enhancing comfort -Symptom management to enhance comfort  -- Morphine/Roxanol 5 mg p.o./sublingual every 6 hours scheduled for underlying generalized pain  -- Morphine/Roxanol 5 mg p.o./sublingual every 3 hours as needed for breakthrough pain -Family is hopeful for residential  hospice unit for end-of-life care-- patient with underlying chronic pain, prognosis is likely less than a few weeks.  Will make referral   Education  offered on hospice benefit; philosophy and eligibility.  Education offered on both hospice at SNF versus inpatient hospice unit   Questions and concerns addressed   Discussed with primary team and nursing staff  PMT will follow-up in the morning   Time:  50   minutes     Discussed with treatment team via secure chat  Detailed review of medical records ( labs, imaging, vital signs), medically appropriate exam ( MS, skin, cardiac,  resp)   discussed with treatment team, counseling and education to patient, family, staff, documenting clinical information, medication management, coordination of care    Lorinda Creed NP  Palliative Medicine Team Team Phone # [phone] Pager  [phone]  

## 2023-02-06 NOTE — Plan of Care (Signed)

## 2023-02-07 DIAGNOSIS — K922 Gastrointestinal hemorrhage, unspecified: Secondary | ICD-10-CM | POA: Diagnosis not present

## 2023-02-07 NOTE — Discharge Summary (Signed)
Physician Discharge Summary  Wanda [name] UJW:[id number] DOB: [birth year] DOA: 02/02/2023  PCP: Pearline Cables, MD  Admit date: 02/02/2023  Discharge date: 02/07/2023  Admitted From:Home  Disposition:  Hospice  Recommendations for Outpatient Follow-up:  Follow up with hospice facility  Home Health:None  Equipment/Devices:None  Discharge Condition:Stable  CODE STATUS: DNR  Diet recommendation: Heart Healthy  Brief/Interim Summary: 83 year old female with history of hypertension, hyperlipidemia, diabetes mellitus type 2, severe protein calorie malnutrition, anxiety and depression, DVT on Xarelto, unspecified prior CVA with hemiplegia and nonverbal state was sent from her facility because of anemia and apparently blood was noted in her stool. On presentation, hemoglobin was 6.2 with positive fecal occult blood and INR of 2.47. Chest x-ray showed questionable metastatic cancer versus myeloma. She was started on IV Protonix. Packed red cell transfusion was ordered. GI consulted. Palliative care also has been consulted for goals of care discussion. Patient/family not interested in any endoscopic procedures. GI signed off on 02/04/2023. Family decided to pursue comfort measures on 02/05/2023. She now has hospice bed and is stable for discharge on 1/29.  Discharge Diagnoses:  Principal Problem:   GI bleed Active Problems:   Stroke (cerebrum) (HCC)   Essential hypertension   Hyperlipidemia with target LDL less than 70   Insulin dependent type 2 diabetes mellitus (HCC)   Protein-calorie malnutrition, severe   Anemia   Fecal impaction [facility])    Discharge Instructions  Discharge Instructions     Diet - low sodium heart healthy   Complete by: As directed    Increase activity slowly   Complete by: As directed    No wound care   Complete by: As directed       Allergies as of 02/07/2023       Reactions   Ace Inhibitors Diarrhea, Swelling, Other (See Comments), Cough   Pt  had cough and diarrhea with first few doses of medication; also had swelling of right eyelid. Stopped medication on 03/17/11.   Alendronate Sodium Other (See Comments)   "Made my whole body hurt"   Aspirin Hives   Crestor [rosuvastatin] Other (See Comments)   "Made my whole body hurt"   Lipitor [atorvastatin Calcium] Other (See Comments)   "Made my whole body hurt"   Metformin And Related    Severe abdominal pain   Shellfish Allergy Nausea Only   Intolerant of fresh shellfish, reports the reaction is GI upset, denies hives, denies any swelling  Reports that she can tolerate canned seafood.    Statins Other (See Comments)   myalgia        Medication List     STOP taking these medications    acetaminophen 325 MG tablet Commonly known as: TYLENOL   amLODipine 5 MG tablet Commonly known as: NORVASC   blood glucose meter kit and supplies   ezetimibe 10 MG tablet Commonly known as: ZETIA   glipiZIDE 5 MG 24 hr tablet Commonly known as: GLUCOTROL XL   glucose blood test strip Commonly known as: ONE TOUCH ULTRA TEST   insulin aspart 100 UNIT/ML injection Commonly known as: novoLOG   insulin glargine-yfgn 100 UNIT/ML injection Commonly known as: SEMGLEE   Insulin Pen Needle 32G X 4 MM Misc Commonly known as: BD Pen Needle Nano U/F   INSULIN SYRINGE .5CC/31GX5/16" 31G X 5/16" 0.5 ML Misc   Lancets Misc   magnesium oxide 400 (240 Mg) MG tablet Commonly known as: MAG-OX   metoprolol tartrate 25 MG tablet Commonly known as: [employer]  multivitamin with minerals Tabs tablet   ONE TOUCH ULTRA MINI w/Device Kit   OneTouch Ultra test strip Generic drug: glucose blood   polyethylene glycol 17 g packet Commonly known as: MIRALAX / GLYCOLAX   PRO-STAT PO   rivaroxaban 20 MG Tabs tablet Commonly known as: XARELTO   Semglee (yfgn) 100 UNIT/ML injection Generic drug: insulin glargine-yfgn   senna-docusate 8.6-50 MG tablet Commonly known as: Senokot-S    sitaGLIPtin 25 MG tablet Commonly known as: JANUVIA   venlafaxine 37.5 MG tablet Commonly known as: EFFEXOR   venlafaxine XR 75 MG 24 hr capsule Commonly known as: EFFEXOR-XR        Contact information for after-discharge care     Destination     [facility] .   Service: Skilled Nursing Contact information: [address] [place] [address] [phone]                    Allergies  Allergen Reactions   Ace Inhibitors Diarrhea, Swelling, Other (See Comments) and Cough    Pt had cough and diarrhea with first few doses of medication; also had swelling of right eyelid. Stopped medication on 03/17/11.   Alendronate Sodium Other (See Comments)    "Made my whole body hurt"   Aspirin Hives   Crestor [Rosuvastatin] Other (See Comments)    "Made my whole body hurt"   Lipitor [Atorvastatin Calcium] Other (See Comments)    "Made my whole body hurt"   Metformin And Related     Severe abdominal pain   Shellfish Allergy Nausea Only    Intolerant of fresh shellfish, reports the reaction is GI upset, denies hives, denies any swelling  Reports that she can tolerate canned seafood.    Statins Other (See Comments)    myalgia    Consultations: None   Procedures/Studies: DG Abd 2 Views Result Date: 02/04/2023 CLINICAL DATA:  Possible fecal impaction EXAM: ABDOMEN - 2 VIEW COMPARISON:  12/29/2021 FINDINGS: Scattered large and small bowel gas is noted. No obstructive changes are seen. Mild retained fecal material is noted within the rectum which may represent some early impaction. Bladder is well distended with contrast opacified urine. No free air is seen. No bony abnormality is noted. IMPRESSION: Retained fecal material within the rectum which may represent an early impaction. Electronically Signed   By: Alcide Clever M.D.   On: 02/04/2023 00:59   CT CHEST ABDOMEN PELVIS W CONTRAST Result Date: 02/03/2023 CLINICAL DATA:  Metastatic colon cancer  staging. EXAM: CT CHEST, ABDOMEN, AND PELVIS WITH CONTRAST TECHNIQUE: Multidetector CT imaging of the chest, abdomen and pelvis was performed following the standard protocol during bolus administration of intravenous contrast. RADIATION DOSE REDUCTION: This exam was performed according to the departmental dose-optimization program which includes automated exposure control, adjustment of the mA and/or kV according to patient size and/or use of iterative reconstruction technique. CONTRAST:  75mL OMNIPAQUE IOHEXOL 350 MG/ML SOLN COMPARISON:  CT abdomen and pelvis 11/30/2021. FINDINGS: CT CHEST FINDINGS Cardiovascular: No significant vascular findings. Normal heart size. No pericardial effusion. There are atherosclerotic calcifications of the aorta. Mediastinum/Nodes: The thyroid gland is heterogeneous and enlarged containing numerous nodules. The largest nodular densities in the isthmus measuring up to 2.8 cm. There are no enlarged mediastinal, hilar or axillary lymph nodes. Visualized esophagus is within normal limits. Lungs/Pleura: There is atelectasis in the bilateral lower lobes, right greater than left. Patchy ground-glass opacities in the left upper lobe are nonspecific. There is no pleural effusion or pneumothorax.  There some secretions in the trachea. Musculoskeletal: No acute osseous findings. No suspicious osseous lesion identified. T11 vertebral body hemangioma is present. CT ABDOMEN PELVIS FINDINGS Hepatobiliary: No focal liver abnormality is seen. No gallstones, gallbladder wall thickening, or biliary dilatation. Pancreas: Low-attenuation lesions are seen in the body and tail of the pancreas measuring up to 1 cm. These have mildly increased in size in the tail of the pancreas. There is no pancreatic ductal dilatation or surrounding inflammation. Spleen: Normal in size without focal abnormality. Adrenals/Urinary Tract: Adrenal glands are unremarkable. There is no hydronephrosis. There is a left renal cyst  measuring 15 mm. Bladder is unremarkable. Stomach/Bowel: There circumferential distal rectal wall thickening. The rectum is dilated and stool-filled with mild surrounding inflammation. No dilated bowel loops are seen. The appendix is within normal limits. Small bowel and stomach are within normal limits. Vascular/Lymphatic: Aortic atherosclerosis. No enlarged abdominal or pelvic lymph nodes. Reproductive: Status post hysterectomy. No adnexal masses. Other: There is presacral edema. There is no ascites or focal abdominal wall hernia. There is mild body wall edema. Musculoskeletal: No acute fracture or focal osseous lesion identified. The bones are diffusely osteopenic. IMPRESSION: 1. Circumferential distal rectal wall thickening with surrounding inflammation and presacral edema. Findings are suspicious for rectal malignancy. 2. There is rectal fecal impaction and findings suspicious for stercoral colitis. 3. No evidence for metastatic disease in the chest, abdomen or pelvis. 4. Patchy ground-glass opacities in the left upper lobe are nonspecific and may be infectious/inflammatory. 5. Low-attenuation lesions in the body and tail of the pancreas measuring up to 1 cm. These have mildly increased in size in the tail of the pancreas. Findings may represent side branch IPMNs. Recommend follow-up MRI in 1 year. 6. Multinodular goiter.  Recommend nonemergent thyroid ultrasound. 7. Mild body wall edema. 8. Left Bosniak I benign renal cyst measuring 1.5 cm. No follow-up imaging is recommended. JACR 2018 Feb; 264-273, Management of the Incidental Renal Mass on CT, [employer] 2021; [id number], Bosniak Classification of Cystic Renal Masses, Version 2019. Aortic Atherosclerosis (ICD10-I70.0). Electronically Signed   By: Darliss Cheney M.D.   On: 02/03/2023 02:17   DG Chest 1 View Result Date: 02/02/2023 CLINICAL DATA:  Sepsis EXAM: CHEST  1 VIEW COMPARISON:  12/26/2021 FINDINGS: Low lung volumes. No consolidation or effusion.  Stable cardiomediastinal silhouette with aortic atherosclerosis. No pneumothorax. Interval permeative appearance of the shafts of the left greater than right humerus. IMPRESSION: 1. Low lung volumes. No active disease. 2. Interval permeative appearance of the shafts of the left greater than right humerus, appearance is suspicious for metastatic disease or marrow disease such as myeloma. Electronically Signed   By: Jasmine Pang M.D.   On: 02/02/2023 20:03     Discharge Exam: Vitals:   02/06/23 1952 02/07/23 0802  BP: (!) 135/57 (!) 128/58  Pulse: 93 96  Resp: 18   Temp: 98 F (36.7 C) 98.5 F (36.9 C)  SpO2: 99% 98%   Vitals:   02/06/23 0753 02/06/23 1632 02/06/23 1952 02/07/23 0802  BP: (!) 149/72 (!) 143/68 (!) 135/57 (!) 128/58  Pulse: 93 99 93 96  Resp: 17 17 18    Temp: 98.3 F (36.8 C) 98.2 F (36.8 C) 98 F (36.7 C) 98.5 F (36.9 C)  TempSrc:    Oral  SpO2: 100% 100% 99% 98%  Weight:      Height:        General: Pt is alert, awake, not in acute distress Cardiovascular: RRR, S1/S2 +, no rubs, no  gallops Respiratory: CTA bilaterally, no wheezing, no rhonchi Abdominal: Soft, NT, ND, bowel sounds + Extremities: no edema, no cyanosis    The results of significant diagnostics from this hospitalization (including imaging, microbiology, ancillary and laboratory) are listed below for reference.     Microbiology: Recent Results (from the past 240 hours)  Culture, blood (Routine X 2) w Reflex to ID Panel     Status: Abnormal   Collection Time: 02/03/23  3:21 AM   Specimen: BLOOD  Result Value Ref Range Status   Specimen Description BLOOD BLOOD LEFT HAND  Final   Special Requests   Final    BOTTLES DRAWN AEROBIC AND ANAEROBIC Blood Culture adequate volume   Culture  Setup Time   Final    GRAM POSITIVE COCCI IN CLUSTERS AEROBIC BOTTLE ONLY CRITICAL RESULT CALLED TO, READ BACK BY AND VERIFIED WITH: PHARMD J LEDFORD 02/04/2023 @ 0114 BY AB    Culture (A)  Final     STAPHYLOCOCCUS HOMINIS THE SIGNIFICANCE OF ISOLATING THIS ORGANISM FROM A SINGLE SET OF BLOOD CULTURES WHEN MULTIPLE SETS ARE DRAWN IS UNCERTAIN. PLEASE NOTIFY THE MICROBIOLOGY DEPARTMENT WITHIN ONE WEEK IF SPECIATION AND SENSITIVITIES ARE REQUIRED. Performed at [facility], [address], [place], [place] [address]    Report Status 02/05/2023 FINAL  Final  Culture, blood (Routine X 2) w Reflex to ID Panel     Status: Abnormal   Collection Time: 02/03/23  3:21 AM   Specimen: BLOOD  Result Value Ref Range Status   Specimen Description BLOOD BLOOD LEFT ARM  Final   Special Requests   Final    BOTTLES DRAWN AEROBIC AND ANAEROBIC Blood Culture adequate volume   Culture  Setup Time   Final    GRAM POSITIVE COCCI IN CLUSTERS AEROBIC BOTTLE ONLY CRITICAL VALUE NOTED.  VALUE IS CONSISTENT WITH PREVIOUSLY REPORTED AND CALLED VALUE.    Culture (A)  Final    STAPHYLOCOCCUS CAPITIS THE SIGNIFICANCE OF ISOLATING THIS ORGANISM FROM A SINGLE SET OF BLOOD CULTURES WHEN MULTIPLE SETS ARE DRAWN IS UNCERTAIN. PLEASE NOTIFY THE MICROBIOLOGY DEPARTMENT WITHIN ONE WEEK IF SPECIATION AND SENSITIVITIES ARE REQUIRED. Performed at [facility], [address] [place], [place] [address]    Report Status 02/05/2023 FINAL  Final  Blood Culture ID Panel (Reflexed)     Status: Abnormal   Collection Time: 02/03/23  3:21 AM  Result Value Ref Range Status   Enterococcus faecalis NOT DETECTED NOT DETECTED Final   Enterococcus Faecium NOT DETECTED NOT DETECTED Final   Listeria monocytogenes NOT DETECTED NOT DETECTED Final   Staphylococcus species DETECTED (A) NOT DETECTED Final    Comment: CRITICAL RESULT CALLED TO, READ BACK BY AND VERIFIED WITH: PHARMD J LEDFORD 02/04/2023 @ 0114 BY AB    Staphylococcus aureus (BCID) NOT DETECTED NOT DETECTED Final   Staphylococcus epidermidis NOT DETECTED NOT DETECTED Final   Staphylococcus lugdunensis NOT DETECTED NOT DETECTED Final   Streptococcus species NOT  DETECTED NOT DETECTED Final   Streptococcus agalactiae NOT DETECTED NOT DETECTED Final   Streptococcus pneumoniae NOT DETECTED NOT DETECTED Final   Streptococcus pyogenes NOT DETECTED NOT DETECTED Final   A.calcoaceticus-baumannii NOT DETECTED NOT DETECTED Final   Bacteroides fragilis NOT DETECTED NOT DETECTED Final   Enterobacterales NOT DETECTED NOT DETECTED Final   Enterobacter cloacae complex NOT DETECTED NOT DETECTED Final   Escherichia coli NOT DETECTED NOT DETECTED Final   Klebsiella aerogenes NOT DETECTED NOT DETECTED Final   Klebsiella oxytoca NOT DETECTED NOT  DETECTED Final   Klebsiella pneumoniae NOT DETECTED NOT DETECTED Final   Proteus species NOT DETECTED NOT DETECTED Final   Salmonella species NOT DETECTED NOT DETECTED Final   Serratia marcescens NOT DETECTED NOT DETECTED Final   Haemophilus influenzae NOT DETECTED NOT DETECTED Final   Neisseria meningitidis NOT DETECTED NOT DETECTED Final   Pseudomonas aeruginosa NOT DETECTED NOT DETECTED Final   Stenotrophomonas maltophilia NOT DETECTED NOT DETECTED Final   Candida albicans NOT DETECTED NOT DETECTED Final   Candida auris NOT DETECTED NOT DETECTED Final   Candida glabrata NOT DETECTED NOT DETECTED Final   Candida krusei NOT DETECTED NOT DETECTED Final   Candida parapsilosis NOT DETECTED NOT DETECTED Final   Candida tropicalis NOT DETECTED NOT DETECTED Final   Cryptococcus neoformans/gattii NOT DETECTED NOT DETECTED Final    Comment: Performed at [facility], [address] [place], [place] [address]     Labs: BNP (last 3 results) No results for input(s): "BNP" in the last 8760 hours. Basic Metabolic Panel: Recent Labs  Lab 02/02/23 1940 02/04/23 0722 02/05/23 0828  NA 142 143 139  K 4.1 3.8 3.2*  CL 109 112* 108  CO2 24 22 24   GLUCOSE 115* 128* 239*  BUN 25* 19 14  CREATININE 0.97 1.05* 0.99  CALCIUM 9.7 9.4 8.9  MG  --  2.7* 1.9   Liver Function Tests: Recent Labs  Lab 02/02/23 1940  02/04/23 0722  AST 18 26  ALT 15 16  ALKPHOS 57 55  BILITOT 0.4 0.7  PROT 6.6 6.2*  ALBUMIN 3.0* 2.8*   No results for input(s): "LIPASE", "AMYLASE" in the last 168 hours. Recent Labs  Lab 02/03/23 0030  AMMONIA 27   CBC: Recent Labs  Lab 02/02/23 1940 02/03/23 0109 02/03/23 0939 02/04/23 0722 02/05/23 0828  WBC 9.6  --   --  12.5* 8.5  NEUTROABS 6.6  --   --  10.7* 6.2  HGB 6.2* 8.2* 7.6* 8.3* 7.2*  HCT 21.2* 26.1* 23.8* 25.5* 22.6*  MCV 79.7*  --   --  78.5* 79.3*  PLT 401*  --   --  376 347   Cardiac Enzymes: No results for input(s): "CKTOTAL", "CKMB", "CKMBINDEX", "TROPONINI" in the last 168 hours. BNP: Invalid input(s): "POCBNP" CBG: Recent Labs  Lab 02/03/23 2043 02/04/23 0008 02/04/23 0409 02/04/23 0751 02/04/23 1215  GLUCAP 72 165* 149* 116* 132*   D-Dimer No results for input(s): "DDIMER" in the last 72 hours. Hgb A1c No results for input(s): "HGBA1C" in the last 72 hours. Lipid Profile No results for input(s): "CHOL", "HDL", "LDLCALC", "TRIG", "CHOLHDL", "LDLDIRECT" in the last 72 hours. Thyroid function studies No results for input(s): "TSH", "T4TOTAL", "T3FREE", "THYROIDAB" in the last 72 hours.  Invalid input(s): "FREET3" Anemia work up No results for input(s): "VITAMINB12", "FOLATE", "FERRITIN", "TIBC", "IRON", "RETICCTPCT" in the last 72 hours. Urinalysis    Component Value Date/Time   COLORURINE YELLOW 05/09/2022 0904   APPEARANCEUR CLOUDY (A) 05/09/2022 0904   LABSPEC 1.015 05/09/2022 0904   PHURINE 6.0 05/09/2022 0904   GLUCOSEU 150 (A) 05/09/2022 0904   HGBUR MODERATE (A) 05/09/2022 0904   BILIRUBINUR NEGATIVE 05/09/2022 0904   BILIRUBINUR neg 07/18/2012 0938   KETONESUR NEGATIVE 05/09/2022 0904   PROTEINUR NEGATIVE 05/09/2022 0904   UROBILINOGEN 0.2 07/18/2012 0938   NITRITE NEGATIVE 05/09/2022 0904   LEUKOCYTESUR NEGATIVE 05/09/2022 0904   Sepsis Labs Recent Labs  Lab 02/02/23 1940 02/04/23 0722 02/05/23 0828  WBC 9.6  12.5* 8.5  Microbiology Recent Results (from the past 240 hours)  Culture, blood (Routine X 2) w Reflex to ID Panel     Status: Abnormal   Collection Time: 02/03/23  3:21 AM   Specimen: BLOOD  Result Value Ref Range Status   Specimen Description BLOOD BLOOD LEFT HAND  Final   Special Requests   Final    BOTTLES DRAWN AEROBIC AND ANAEROBIC Blood Culture adequate volume   Culture  Setup Time   Final    GRAM POSITIVE COCCI IN CLUSTERS AEROBIC BOTTLE ONLY CRITICAL RESULT CALLED TO, READ BACK BY AND VERIFIED WITH: PHARMD J LEDFORD 02/04/2023 @ 0114 BY AB    Culture (A)  Final    STAPHYLOCOCCUS HOMINIS THE SIGNIFICANCE OF ISOLATING THIS ORGANISM FROM A SINGLE SET OF BLOOD CULTURES WHEN MULTIPLE SETS ARE DRAWN IS UNCERTAIN. PLEASE NOTIFY THE MICROBIOLOGY DEPARTMENT WITHIN ONE WEEK IF SPECIATION AND SENSITIVITIES ARE REQUIRED. Performed at [facility], [address], [place], [place] [address]    Report Status 02/05/2023 FINAL  Final  Culture, blood (Routine X 2) w Reflex to ID Panel     Status: Abnormal   Collection Time: 02/03/23  3:21 AM   Specimen: BLOOD  Result Value Ref Range Status   Specimen Description BLOOD BLOOD LEFT ARM  Final   Special Requests   Final    BOTTLES DRAWN AEROBIC AND ANAEROBIC Blood Culture adequate volume   Culture  Setup Time   Final    GRAM POSITIVE COCCI IN CLUSTERS AEROBIC BOTTLE ONLY CRITICAL VALUE NOTED.  VALUE IS CONSISTENT WITH PREVIOUSLY REPORTED AND CALLED VALUE.    Culture (A)  Final    STAPHYLOCOCCUS CAPITIS THE SIGNIFICANCE OF ISOLATING THIS ORGANISM FROM A SINGLE SET OF BLOOD CULTURES WHEN MULTIPLE SETS ARE DRAWN IS UNCERTAIN. PLEASE NOTIFY THE MICROBIOLOGY DEPARTMENT WITHIN ONE WEEK IF SPECIATION AND SENSITIVITIES ARE REQUIRED. Performed at [facility], [address] [place], [place] [address]    Report Status 02/05/2023 FINAL  Final  Blood Culture ID Panel (Reflexed)     Status: Abnormal   Collection Time: 02/03/23  3:21  AM  Result Value Ref Range Status   Enterococcus faecalis NOT DETECTED NOT DETECTED Final   Enterococcus Faecium NOT DETECTED NOT DETECTED Final   Listeria monocytogenes NOT DETECTED NOT DETECTED Final   Staphylococcus species DETECTED (A) NOT DETECTED Final    Comment: CRITICAL RESULT CALLED TO, READ BACK BY AND VERIFIED WITH: PHARMD J LEDFORD 02/04/2023 @ 0114 BY AB    Staphylococcus aureus (BCID) NOT DETECTED NOT DETECTED Final   Staphylococcus epidermidis NOT DETECTED NOT DETECTED Final   Staphylococcus lugdunensis NOT DETECTED NOT DETECTED Final   Streptococcus species NOT DETECTED NOT DETECTED Final   Streptococcus agalactiae NOT DETECTED NOT DETECTED Final   Streptococcus pneumoniae NOT DETECTED NOT DETECTED Final   Streptococcus pyogenes NOT DETECTED NOT DETECTED Final   A.calcoaceticus-baumannii NOT DETECTED NOT DETECTED Final   Bacteroides fragilis NOT DETECTED NOT DETECTED Final   Enterobacterales NOT DETECTED NOT DETECTED Final   Enterobacter cloacae complex NOT DETECTED NOT DETECTED Final   Escherichia coli NOT DETECTED NOT DETECTED Final   Klebsiella aerogenes NOT DETECTED NOT DETECTED Final   Klebsiella oxytoca NOT DETECTED NOT DETECTED Final   Klebsiella pneumoniae NOT DETECTED NOT DETECTED Final   Proteus species NOT DETECTED NOT DETECTED Final   Salmonella species NOT DETECTED NOT DETECTED Final   Serratia marcescens NOT DETECTED NOT DETECTED Final   Haemophilus influenzae NOT DETECTED NOT DETECTED Final  Neisseria meningitidis NOT DETECTED NOT DETECTED Final   Pseudomonas aeruginosa NOT DETECTED NOT DETECTED Final   Stenotrophomonas maltophilia NOT DETECTED NOT DETECTED Final   Candida albicans NOT DETECTED NOT DETECTED Final   Candida auris NOT DETECTED NOT DETECTED Final   Candida glabrata NOT DETECTED NOT DETECTED Final   Candida krusei NOT DETECTED NOT DETECTED Final   Candida parapsilosis NOT DETECTED NOT DETECTED Final   Candida tropicalis NOT DETECTED NOT  DETECTED Final   Cryptococcus neoformans/gattii NOT DETECTED NOT DETECTED Final    Comment: Performed at [facility], [address] [place], [place] [address]     Time coordinating discharge: 35 minutes  SIGNED:   Erick Blinks, DO [facility] 02/07/2023, 3:56 PM  If 7PM-7AM, please contact night-coverage [email or web address]

## 2023-02-07 NOTE — Progress Notes (Signed)
[facility] 2W[facility] Liaison Note  Received request from [facility] manager for family interest in our IPU. Eligibility confirmed. Spoke with daughter, Olegario Messier to confirm interest,explain services and offer a bed a [facility] in [place]. Olegario Messier agreeable to transfer today. TOC aware.  RN please call report to [phone] prior to patient leaving the unit. Please send signed DNR with patient at discharge.   Thank you for allowing [place] to participate in this patient's care.  Henderson Newcomer, LPN [facility] Liaison [phone]

## 2023-02-07 NOTE — Plan of Care (Signed)

## 2023-02-07 NOTE — Plan of Care (Signed)

## 2023-02-07 NOTE — Progress Notes (Signed)
This chaplain is present for F/U spiritual care. The Pt. family is not at the bedside. The chaplain understands from the previous visit with the Pt daughter-Kathy F/U is accepted. The Pt. is resting comfortably.  The chaplain offered a quiet presence at the bedside and ended the visit with prayer.  Chaplain Stephanie Acre [phone]

## 2023-02-07 NOTE — Progress Notes (Signed)
PROGRESS NOTE    Wanda [name]  OZH:[id number] DOB: 08/25/1940 DOA: 02/02/2023 PCP: Pearline Cables, MD   Brief Narrative:  83 year old female with history of hypertension, hyperlipidemia, diabetes mellitus type 2, severe protein calorie malnutrition, anxiety and depression, DVT on Xarelto, unspecified prior CVA with hemiplegia and nonverbal state was sent from her facility because of anemia and apparently blood was noted in her stool.  On presentation, hemoglobin was 6.2 with positive fecal occult blood and INR of 2.47.  Chest x-ray showed questionable metastatic cancer versus myeloma.  She was started on IV Protonix.  Packed red cell transfusion was ordered.  GI consulted.  Palliative care also has been consulted for goals of care discussion.  Patient/family not interested in any endoscopic procedures.  GI signed off on 02/04/2023.  Family decided to pursue comfort measures on 02/05/2023.  Assessment & Plan:   Comfort measures only status  probable GI bleed presenting with acute blood loss anemia Left upper lobe pneumonia Fecal impaction and concern for rectal malignancy versus stercoral colitis Coagulopathy?  Secondary to Xarelto use Multinodular goiter Lesions in the pancreatic body and tail Essential hypertension Hyperlipidemia Anxiety and depression Diabetes mellitus type 2 with hyperglycemia and hypoglycemia History of DVT Severe protein calorie malnutrition History of unspecified stroke with residual hemiplegia and nonverbal state  DVT prophylaxis: None for comfort measures  code Status: DNR  Family Communication: None at bedside Disposition Plan: Status is: Inpatient Remains inpatient appropriate because: Of severity of illness  Consultants: GI/palliative care  Procedures: None  Antimicrobials:  Anti-infectives (From admission, onward)    Start     Dose/Rate Route Frequency Ordered Stop   02/03/23 1400  piperacillin-tazobactam (ZOSYN) IVPB 3.375 g  Status:   Discontinued        3.375 g 12.5 mL/hr over 240 Minutes Intravenous Every 8 hours 02/03/23 0813 02/04/23 1617   02/03/23 0700  piperacillin-tazobactam (ZOSYN) IVPB 3.375 g  Status:  Discontinued        3.375 g 12.5 mL/hr over 240 Minutes Intravenous Every 8 hours 02/03/23 0241 02/03/23 0813   02/03/23 0245  piperacillin-tazobactam (ZOSYN) IVPB 3.375 g        3.375 g 100 mL/hr over 30 Minutes Intravenous  Once 02/03/23 0239 02/03/23 0842        Subjective: Patient seen and examined at bedside.  No agitation, seizures reported.  Patient is confused.   Objective: Vitals:   02/06/23 0753 02/06/23 1632 02/06/23 1952 02/07/23 0802  BP: (!) 149/72 (!) 143/68 (!) 135/57 (!) 128/58  Pulse: 93 99 93 96  Resp: 17 17 18    Temp: 98.3 F (36.8 C) 98.2 F (36.8 C) 98 F (36.7 C) 98.5 F (36.9 C)  TempSrc:    Oral  SpO2: 100% 100% 99% 98%  Weight:      Height:        Intake/Output Summary (Last 24 hours) at 02/07/2023 1037 Last data filed at 02/06/2023 2045 Gross per 24 hour  Intake 118 ml  Output 1 ml  Net 117 ml   Filed Weights   02/03/23 0200 02/03/23 1427 02/04/23 0407  Weight: 65.8 kg 65.7 kg 44.9 kg    Examination:  General: Confused.  No distress.  Chronically ill and deconditioned looking  Data Reviewed: I have personally reviewed following labs and imaging studies  CBC: Recent Labs  Lab 02/02/23 1940 02/03/23 0109 02/03/23 0939 02/04/23 0722 02/05/23 0828  WBC 9.6  --   --  12.5* 8.5  NEUTROABS 6.6  --   --  10.7* 6.2  HGB 6.2* 8.2* 7.6* 8.3* 7.2*  HCT 21.2* 26.1* 23.8* 25.5* 22.6*  MCV 79.7*  --   --  78.5* 79.3*  PLT 401*  --   --  376 347   Basic Metabolic Panel: Recent Labs  Lab 02/02/23 1940 02/04/23 0722 02/05/23 0828  NA 142 143 139  K 4.1 3.8 3.2*  CL 109 112* 108  CO2 24 22 24   GLUCOSE 115* 128* 239*  BUN 25* 19 14  CREATININE 0.97 1.05* 0.99  CALCIUM 9.7 9.4 8.9  MG  --  2.7* 1.9   GFR: Estimated Creatinine Clearance: 30.5 mL/min (by  C-G formula based on SCr of 0.99 mg/dL). Liver Function Tests: Recent Labs  Lab 02/02/23 1940 02/04/23 0722  AST 18 26  ALT 15 16  ALKPHOS 57 55  BILITOT 0.4 0.7  PROT 6.6 6.2*  ALBUMIN 3.0* 2.8*   No results for input(s): "LIPASE", "AMYLASE" in the last 168 hours. Recent Labs  Lab 02/03/23 0030  AMMONIA 27   Coagulation Profile: Recent Labs  Lab 02/02/23 1940  INR 2.4*   Cardiac Enzymes: No results for input(s): "CKTOTAL", "CKMB", "CKMBINDEX", "TROPONINI" in the last 168 hours. BNP (last 3 results) No results for input(s): "PROBNP" in the last 8760 hours. HbA1C: No results for input(s): "HGBA1C" in the last 72 hours.  CBG: Recent Labs  Lab 02/03/23 2043 02/04/23 0008 02/04/23 0409 02/04/23 0751 02/04/23 1215  GLUCAP 72 165* 149* 116* 132*   Lipid Profile: No results for input(s): "CHOL", "HDL", "LDLCALC", "TRIG", "CHOLHDL", "LDLDIRECT" in the last 72 hours. Thyroid Function Tests: No results for input(s): "TSH", "T4TOTAL", "FREET4", "T3FREE", "THYROIDAB" in the last 72 hours. Anemia Panel: No results for input(s): "VITAMINB12", "FOLATE", "FERRITIN", "TIBC", "IRON", "RETICCTPCT" in the last 72 hours.  Sepsis Labs: No results for input(s): "PROCALCITON", "LATICACIDVEN" in the last 168 hours.  Recent Results (from the past 240 hours)  Culture, blood (Routine X 2) w Reflex to ID Panel     Status: Abnormal   Collection Time: 02/03/23  3:21 AM   Specimen: BLOOD  Result Value Ref Range Status   Specimen Description BLOOD BLOOD LEFT HAND  Final   Special Requests   Final    BOTTLES DRAWN AEROBIC AND ANAEROBIC Blood Culture adequate volume   Culture  Setup Time   Final    GRAM POSITIVE COCCI IN CLUSTERS AEROBIC BOTTLE ONLY CRITICAL RESULT CALLED TO, READ BACK BY AND VERIFIED WITH: PHARMD J LEDFORD 02/04/2023 @ 0114 BY AB    Culture (A)  Final    STAPHYLOCOCCUS HOMINIS THE SIGNIFICANCE OF ISOLATING THIS ORGANISM FROM A SINGLE SET OF BLOOD CULTURES WHEN  MULTIPLE SETS ARE DRAWN IS UNCERTAIN. PLEASE NOTIFY THE MICROBIOLOGY DEPARTMENT WITHIN ONE WEEK IF SPECIATION AND SENSITIVITIES ARE REQUIRED. Performed at [facility], [address], [place], [place] [address]    Report Status 02/05/2023 FINAL  Final  Culture, blood (Routine X 2) w Reflex to ID Panel     Status: Abnormal   Collection Time: 02/03/23  3:21 AM   Specimen: BLOOD  Result Value Ref Range Status   Specimen Description BLOOD BLOOD LEFT ARM  Final   Special Requests   Final    BOTTLES DRAWN AEROBIC AND ANAEROBIC Blood Culture adequate volume   Culture  Setup Time   Final    GRAM POSITIVE COCCI IN CLUSTERS AEROBIC BOTTLE ONLY CRITICAL VALUE NOTED.  VALUE IS CONSISTENT WITH PREVIOUSLY REPORTED AND CALLED VALUE.    Culture (  A)  Final    STAPHYLOCOCCUS CAPITIS THE SIGNIFICANCE OF ISOLATING THIS ORGANISM FROM A SINGLE SET OF BLOOD CULTURES WHEN MULTIPLE SETS ARE DRAWN IS UNCERTAIN. PLEASE NOTIFY THE MICROBIOLOGY DEPARTMENT WITHIN ONE WEEK IF SPECIATION AND SENSITIVITIES ARE REQUIRED. Performed at [facility], [address] [place], [place] [address]    Report Status 02/05/2023 FINAL  Final  Blood Culture ID Panel (Reflexed)     Status: Abnormal   Collection Time: 02/03/23  3:21 AM  Result Value Ref Range Status   Enterococcus faecalis NOT DETECTED NOT DETECTED Final   Enterococcus Faecium NOT DETECTED NOT DETECTED Final   Listeria monocytogenes NOT DETECTED NOT DETECTED Final   Staphylococcus species DETECTED (A) NOT DETECTED Final    Comment: CRITICAL RESULT CALLED TO, READ BACK BY AND VERIFIED WITH: PHARMD J LEDFORD 02/04/2023 @ 0114 BY AB    Staphylococcus aureus (BCID) NOT DETECTED NOT DETECTED Final   Staphylococcus epidermidis NOT DETECTED NOT DETECTED Final   Staphylococcus lugdunensis NOT DETECTED NOT DETECTED Final   Streptococcus species NOT DETECTED NOT DETECTED Final   Streptococcus agalactiae NOT DETECTED NOT DETECTED Final   Streptococcus  pneumoniae NOT DETECTED NOT DETECTED Final   Streptococcus pyogenes NOT DETECTED NOT DETECTED Final   A.calcoaceticus-baumannii NOT DETECTED NOT DETECTED Final   Bacteroides fragilis NOT DETECTED NOT DETECTED Final   Enterobacterales NOT DETECTED NOT DETECTED Final   Enterobacter cloacae complex NOT DETECTED NOT DETECTED Final   Escherichia coli NOT DETECTED NOT DETECTED Final   Klebsiella aerogenes NOT DETECTED NOT DETECTED Final   Klebsiella oxytoca NOT DETECTED NOT DETECTED Final   Klebsiella pneumoniae NOT DETECTED NOT DETECTED Final   Proteus species NOT DETECTED NOT DETECTED Final   Salmonella species NOT DETECTED NOT DETECTED Final   Serratia marcescens NOT DETECTED NOT DETECTED Final   Haemophilus influenzae NOT DETECTED NOT DETECTED Final   Neisseria meningitidis NOT DETECTED NOT DETECTED Final   Pseudomonas aeruginosa NOT DETECTED NOT DETECTED Final   Stenotrophomonas maltophilia NOT DETECTED NOT DETECTED Final   Candida albicans NOT DETECTED NOT DETECTED Final   Candida auris NOT DETECTED NOT DETECTED Final   Candida glabrata NOT DETECTED NOT DETECTED Final   Candida krusei NOT DETECTED NOT DETECTED Final   Candida parapsilosis NOT DETECTED NOT DETECTED Final   Candida tropicalis NOT DETECTED NOT DETECTED Final   Cryptococcus neoformans/gattii NOT DETECTED NOT DETECTED Final    Comment: Performed at [facility], [address], [place], [place] [address]         Radiology Studies: No results found.       Scheduled Meds:  feeding supplement  1 Container Oral TID BM   milk and molasses  1 enema Rectal Once   morphine CONCENTRATE  5 mg Oral Q6H   polyethylene glycol  17 g Oral BID      Eltha Tingley D Sherryll Burger, DO [facility] 02/07/2023, 10:37 AM

## 2023-02-09 LAB — CULTURE, BLOOD (ROUTINE X 2): Special Requests: ADEQUATE

## 2023-02-15 LAB — MULTIPLE MYELOMA PANEL, SERUM
Albumin SerPl Elph-Mcnc: 2.7 g/dL — ABNORMAL LOW (ref 2.9–4.4)
Albumin/Glob SerPl: 0.9 (ref 0.7–1.7)
Alpha 1: 0.3 g/dL (ref 0.0–0.4)
Alpha2 Glob SerPl Elph-Mcnc: 1 g/dL (ref 0.4–1.0)
B-Globulin SerPl Elph-Mcnc: 1 g/dL (ref 0.7–1.3)
Gamma Glob SerPl Elph-Mcnc: 1 g/dL (ref 0.4–1.8)
Globulin, Total: 3.3 g/dL (ref 2.2–3.9)
IgA: 365 mg/dL (ref 64–422)
IgG (Immunoglobin G), Serum: 950 mg/dL (ref 586–1602)
IgM (Immunoglobulin M), Srm: 170 mg/dL (ref 26–217)
Total Protein ELP: 6 g/dL (ref 6.0–8.5)

## 2023-03-10 DEATH — deceased
